# Patient Record
Sex: Male | Born: 1950
Health system: Southern US, Community
[De-identification: ages and names within clinical notes are randomized; demographics above are authoritative.]

## PROBLEM LIST (undated history)

## (undated) DIAGNOSIS — G9341 Metabolic encephalopathy: Secondary | ICD-10-CM

## (undated) DIAGNOSIS — D649 Anemia, unspecified: Secondary | ICD-10-CM

## (undated) DIAGNOSIS — G8929 Other chronic pain: Secondary | ICD-10-CM

## (undated) DIAGNOSIS — R1312 Dysphagia, oropharyngeal phase: Secondary | ICD-10-CM

## (undated) DIAGNOSIS — K279 Peptic ulcer, site unspecified, unspecified as acute or chronic, without hemorrhage or perforation: Secondary | ICD-10-CM

## (undated) DIAGNOSIS — K449 Diaphragmatic hernia without obstruction or gangrene: Secondary | ICD-10-CM

## (undated) DIAGNOSIS — F32A Depression, unspecified: Secondary | ICD-10-CM

## (undated) DIAGNOSIS — E7800 Pure hypercholesterolemia, unspecified: Secondary | ICD-10-CM

## (undated) DIAGNOSIS — K222 Esophageal obstruction: Secondary | ICD-10-CM

## (undated) DIAGNOSIS — J42 Unspecified chronic bronchitis: Secondary | ICD-10-CM

## (undated) DIAGNOSIS — I10 Essential (primary) hypertension: Secondary | ICD-10-CM

## (undated) DIAGNOSIS — K219 Gastro-esophageal reflux disease without esophagitis: Secondary | ICD-10-CM

## (undated) DIAGNOSIS — F1110 Opioid abuse, uncomplicated: Secondary | ICD-10-CM

## (undated) DIAGNOSIS — R5382 Chronic fatigue, unspecified: Secondary | ICD-10-CM

## (undated) DIAGNOSIS — F329 Major depressive disorder, single episode, unspecified: Secondary | ICD-10-CM

## (undated) DIAGNOSIS — F1010 Alcohol abuse, uncomplicated: Secondary | ICD-10-CM

## (undated) DIAGNOSIS — I314 Cardiac tamponade: Secondary | ICD-10-CM

## (undated) DIAGNOSIS — J189 Pneumonia, unspecified organism: Secondary | ICD-10-CM

## (undated) DIAGNOSIS — I2510 Atherosclerotic heart disease of native coronary artery without angina pectoris: Secondary | ICD-10-CM

## (undated) DIAGNOSIS — I7100 Dissection of unspecified site of aorta: Secondary | ICD-10-CM

## (undated) DIAGNOSIS — F419 Anxiety disorder, unspecified: Secondary | ICD-10-CM

## (undated) DIAGNOSIS — J449 Chronic obstructive pulmonary disease, unspecified: Secondary | ICD-10-CM

## (undated) DIAGNOSIS — K5900 Constipation, unspecified: Secondary | ICD-10-CM

## (undated) HISTORY — DX: Esophageal obstruction: K22.2

## (undated) HISTORY — DX: Atherosclerotic heart disease of native coronary artery without angina pectoris: I25.10

## (undated) HISTORY — PX: ORIF SCAPULAR FRACTURE: SUR958

## (undated) HISTORY — DX: Diaphragmatic hernia without obstruction or gangrene: K44.9

## (undated) HISTORY — PX: SHOULDER SURGERY: SHX246

## (undated) HISTORY — PX: CARDIAC CATHETERIZATION: SHX172

## (undated) HISTORY — PX: BACK SURGERY: SHX140

## (undated) HISTORY — DX: Cardiac tamponade: I31.4

## (undated) HISTORY — DX: Essential (primary) hypertension: I10

## (undated) HISTORY — PX: KNEE SURGERY: SHX244

## (undated) HISTORY — DX: Pure hypercholesterolemia, unspecified: E78.00

## (undated) HISTORY — DX: Dissection of unspecified site of aorta: I71.00

---

## 1997-08-07 ENCOUNTER — Emergency Department (HOSPITAL_COMMUNITY): Admission: EM | Admit: 1997-08-07 | Discharge: 1997-08-08 | Payer: Self-pay | Admitting: Emergency Medicine

## 1998-05-15 ENCOUNTER — Emergency Department (HOSPITAL_COMMUNITY): Admission: EM | Admit: 1998-05-15 | Discharge: 1998-05-15 | Payer: Self-pay | Admitting: Emergency Medicine

## 1998-08-27 ENCOUNTER — Encounter: Payer: Self-pay | Admitting: Emergency Medicine

## 1998-08-27 ENCOUNTER — Emergency Department (HOSPITAL_COMMUNITY): Admission: EM | Admit: 1998-08-27 | Discharge: 1998-08-27 | Payer: Self-pay | Admitting: Emergency Medicine

## 1998-09-04 ENCOUNTER — Encounter: Admission: RE | Admit: 1998-09-04 | Discharge: 1998-09-04 | Payer: Self-pay | Admitting: *Deleted

## 1999-07-13 ENCOUNTER — Emergency Department (HOSPITAL_COMMUNITY): Admission: EM | Admit: 1999-07-13 | Discharge: 1999-07-13 | Payer: Self-pay | Admitting: Emergency Medicine

## 1999-07-23 ENCOUNTER — Encounter: Payer: Self-pay | Admitting: Orthopedic Surgery

## 1999-07-23 ENCOUNTER — Ambulatory Visit (HOSPITAL_COMMUNITY): Admission: RE | Admit: 1999-07-23 | Discharge: 1999-07-23 | Payer: Self-pay | Admitting: Orthopedic Surgery

## 2000-03-19 ENCOUNTER — Ambulatory Visit (HOSPITAL_BASED_OUTPATIENT_CLINIC_OR_DEPARTMENT_OTHER): Admission: RE | Admit: 2000-03-19 | Discharge: 2000-03-19 | Payer: Self-pay | Admitting: Orthopedic Surgery

## 2000-03-21 ENCOUNTER — Emergency Department (HOSPITAL_COMMUNITY): Admission: EM | Admit: 2000-03-21 | Discharge: 2000-03-21 | Payer: Self-pay | Admitting: Emergency Medicine

## 2004-09-21 ENCOUNTER — Ambulatory Visit (HOSPITAL_COMMUNITY): Admission: RE | Admit: 2004-09-21 | Discharge: 2004-09-21 | Payer: Self-pay | Admitting: Orthopedic Surgery

## 2006-09-28 ENCOUNTER — Ambulatory Visit: Payer: Self-pay | Admitting: Internal Medicine

## 2006-09-28 ENCOUNTER — Inpatient Hospital Stay (HOSPITAL_COMMUNITY): Admission: EM | Admit: 2006-09-28 | Discharge: 2006-10-09 | Payer: Self-pay | Admitting: Emergency Medicine

## 2006-09-28 ENCOUNTER — Ambulatory Visit: Payer: Self-pay | Admitting: Thoracic Surgery (Cardiothoracic Vascular Surgery)

## 2006-09-30 ENCOUNTER — Encounter: Payer: Self-pay | Admitting: Gastroenterology

## 2006-10-07 ENCOUNTER — Ambulatory Visit: Payer: Self-pay | Admitting: Infectious Diseases

## 2006-10-14 ENCOUNTER — Observation Stay (HOSPITAL_COMMUNITY): Admission: AD | Admit: 2006-10-14 | Discharge: 2006-10-17 | Payer: Self-pay | Admitting: Gastroenterology

## 2006-11-24 ENCOUNTER — Ambulatory Visit: Payer: Self-pay | Admitting: Thoracic Surgery (Cardiothoracic Vascular Surgery)

## 2006-11-28 ENCOUNTER — Encounter: Admission: RE | Admit: 2006-11-28 | Discharge: 2006-11-28 | Payer: Self-pay | Admitting: Gastroenterology

## 2007-02-14 ENCOUNTER — Encounter: Admission: RE | Admit: 2007-02-14 | Discharge: 2007-02-14 | Payer: Self-pay | Admitting: Orthopedic Surgery

## 2007-03-20 ENCOUNTER — Encounter: Admission: RE | Admit: 2007-03-20 | Discharge: 2007-03-20 | Payer: Self-pay | Admitting: Gastroenterology

## 2007-03-21 ENCOUNTER — Encounter: Admission: RE | Admit: 2007-03-21 | Discharge: 2007-03-21 | Payer: Self-pay | Admitting: Gastroenterology

## 2007-04-06 ENCOUNTER — Ambulatory Visit: Payer: Self-pay | Admitting: Thoracic Surgery (Cardiothoracic Vascular Surgery)

## 2007-07-10 ENCOUNTER — Encounter: Admission: RE | Admit: 2007-07-10 | Discharge: 2007-07-10 | Payer: Self-pay | Admitting: Neurosurgery

## 2007-07-27 ENCOUNTER — Ambulatory Visit: Payer: Self-pay | Admitting: Thoracic Surgery (Cardiothoracic Vascular Surgery)

## 2007-07-28 ENCOUNTER — Other Ambulatory Visit: Payer: Self-pay | Admitting: Emergency Medicine

## 2007-07-28 ENCOUNTER — Inpatient Hospital Stay (HOSPITAL_COMMUNITY): Admission: EM | Admit: 2007-07-28 | Discharge: 2007-08-13 | Payer: Self-pay | Admitting: Emergency Medicine

## 2007-07-29 ENCOUNTER — Encounter: Payer: Self-pay | Admitting: Thoracic Surgery (Cardiothoracic Vascular Surgery)

## 2007-07-31 HISTORY — PX: OTHER SURGICAL HISTORY: SHX169

## 2007-08-08 ENCOUNTER — Encounter: Payer: Self-pay | Admitting: Thoracic Surgery (Cardiothoracic Vascular Surgery)

## 2007-08-24 ENCOUNTER — Ambulatory Visit: Payer: Self-pay | Admitting: Thoracic Surgery (Cardiothoracic Vascular Surgery)

## 2007-08-24 ENCOUNTER — Encounter
Admission: RE | Admit: 2007-08-24 | Discharge: 2007-08-24 | Payer: Self-pay | Admitting: Thoracic Surgery (Cardiothoracic Vascular Surgery)

## 2007-09-10 ENCOUNTER — Encounter (HOSPITAL_COMMUNITY): Admission: RE | Admit: 2007-09-10 | Discharge: 2007-12-09 | Payer: Self-pay | Admitting: Cardiology

## 2007-09-14 ENCOUNTER — Ambulatory Visit: Payer: Self-pay | Admitting: Thoracic Surgery (Cardiothoracic Vascular Surgery)

## 2007-11-27 ENCOUNTER — Encounter
Admission: RE | Admit: 2007-11-27 | Discharge: 2007-11-27 | Payer: Self-pay | Admitting: Thoracic Surgery (Cardiothoracic Vascular Surgery)

## 2007-12-04 ENCOUNTER — Ambulatory Visit: Payer: Self-pay | Admitting: Thoracic Surgery (Cardiothoracic Vascular Surgery)

## 2007-12-10 ENCOUNTER — Encounter (HOSPITAL_COMMUNITY): Admission: RE | Admit: 2007-12-10 | Discharge: 2008-01-20 | Payer: Self-pay | Admitting: Cardiology

## 2007-12-31 ENCOUNTER — Ambulatory Visit: Payer: Self-pay | Admitting: Thoracic Surgery (Cardiothoracic Vascular Surgery)

## 2007-12-31 ENCOUNTER — Ambulatory Visit (HOSPITAL_COMMUNITY)
Admission: RE | Admit: 2007-12-31 | Discharge: 2007-12-31 | Payer: Self-pay | Admitting: Thoracic Surgery (Cardiothoracic Vascular Surgery)

## 2007-12-31 HISTORY — PX: OTHER SURGICAL HISTORY: SHX169

## 2008-01-04 ENCOUNTER — Ambulatory Visit: Payer: Self-pay | Admitting: Thoracic Surgery (Cardiothoracic Vascular Surgery)

## 2008-01-22 ENCOUNTER — Encounter (HOSPITAL_COMMUNITY): Admission: RE | Admit: 2008-01-22 | Discharge: 2008-01-29 | Payer: Self-pay | Admitting: Diagnostic Radiology

## 2008-02-07 ENCOUNTER — Encounter: Admission: RE | Admit: 2008-02-07 | Discharge: 2008-02-07 | Payer: Self-pay | Admitting: Neurosurgery

## 2008-03-18 ENCOUNTER — Ambulatory Visit (HOSPITAL_COMMUNITY): Admission: RE | Admit: 2008-03-18 | Discharge: 2008-03-18 | Payer: Self-pay | Admitting: Cardiology

## 2008-03-18 ENCOUNTER — Ambulatory Visit: Payer: Self-pay | Admitting: Thoracic Surgery (Cardiothoracic Vascular Surgery)

## 2008-08-26 ENCOUNTER — Encounter
Admission: RE | Admit: 2008-08-26 | Discharge: 2008-08-26 | Payer: Self-pay | Admitting: Thoracic Surgery (Cardiothoracic Vascular Surgery)

## 2008-09-02 ENCOUNTER — Ambulatory Visit: Payer: Self-pay | Admitting: Thoracic Surgery (Cardiothoracic Vascular Surgery)

## 2008-10-09 ENCOUNTER — Encounter: Admission: RE | Admit: 2008-10-09 | Discharge: 2008-10-09 | Payer: Self-pay | Admitting: Neurosurgery

## 2009-03-13 ENCOUNTER — Encounter: Admission: RE | Admit: 2009-03-13 | Discharge: 2009-03-13 | Payer: Self-pay | Admitting: Orthopedic Surgery

## 2009-05-17 ENCOUNTER — Ambulatory Visit: Payer: Self-pay | Admitting: Thoracic Surgery (Cardiothoracic Vascular Surgery)

## 2009-08-22 ENCOUNTER — Emergency Department (HOSPITAL_COMMUNITY): Admission: EM | Admit: 2009-08-22 | Discharge: 2009-08-22 | Payer: Self-pay | Admitting: Emergency Medicine

## 2009-09-12 ENCOUNTER — Ambulatory Visit: Payer: Self-pay | Admitting: Thoracic Surgery (Cardiothoracic Vascular Surgery)

## 2009-09-12 ENCOUNTER — Encounter
Admission: RE | Admit: 2009-09-12 | Discharge: 2009-09-12 | Payer: Self-pay | Admitting: Thoracic Surgery (Cardiothoracic Vascular Surgery)

## 2009-12-13 ENCOUNTER — Ambulatory Visit (HOSPITAL_COMMUNITY): Admission: RE | Admit: 2009-12-13 | Discharge: 2009-12-13 | Payer: Self-pay | Admitting: Gastroenterology

## 2009-12-21 ENCOUNTER — Encounter: Admission: RE | Admit: 2009-12-21 | Discharge: 2009-12-21 | Payer: Self-pay | Admitting: Gastroenterology

## 2010-02-11 ENCOUNTER — Encounter: Payer: Self-pay | Admitting: Thoracic Surgery (Cardiothoracic Vascular Surgery)

## 2010-02-12 ENCOUNTER — Encounter: Payer: Self-pay | Admitting: Thoracic Surgery (Cardiothoracic Vascular Surgery)

## 2010-02-12 ENCOUNTER — Encounter: Payer: Self-pay | Admitting: Gastroenterology

## 2010-04-06 LAB — CBC
HCT: 38.9 % — ABNORMAL LOW (ref 39.0–52.0)
Hemoglobin: 13.7 g/dL (ref 13.0–17.0)
MCHC: 35.2 g/dL (ref 30.0–36.0)
MCV: 95.3 fL (ref 78.0–100.0)
RDW: 13.6 % (ref 11.5–15.5)

## 2010-04-06 LAB — RAPID URINE DRUG SCREEN, HOSP PERFORMED
Amphetamines: NOT DETECTED
Opiates: POSITIVE — AB
Tetrahydrocannabinol: NOT DETECTED

## 2010-04-06 LAB — URINALYSIS, ROUTINE W REFLEX MICROSCOPIC
Bilirubin Urine: NEGATIVE
Hgb urine dipstick: NEGATIVE
Ketones, ur: NEGATIVE mg/dL
Nitrite: NEGATIVE
Protein, ur: NEGATIVE mg/dL
Specific Gravity, Urine: 1.015 (ref 1.005–1.030)
Urobilinogen, UA: 1 mg/dL (ref 0.0–1.0)

## 2010-04-06 LAB — COMPREHENSIVE METABOLIC PANEL
ALT: 13 U/L (ref 0–53)
Alkaline Phosphatase: 43 U/L (ref 39–117)
BUN: 10 mg/dL (ref 6–23)
CO2: 28 mEq/L (ref 19–32)
Calcium: 8.7 mg/dL (ref 8.4–10.5)
GFR calc non Af Amer: >60 mL/min (ref >60)
Glucose, Bld: 86 mg/dL (ref 70–99)
Potassium: 3.4 mEq/L — ABNORMAL LOW (ref 3.5–5.1)
Total Protein: 6.7 g/dL (ref 6.0–8.3)

## 2010-04-06 LAB — PROTIME-INR
INR: 1.05 (ref 0.00–1.49)
Prothrombin Time: 13.9 seconds (ref 11.6–15.2)

## 2010-04-06 LAB — AMMONIA: Ammonia: 25 umol/L (ref 11–35)

## 2010-04-06 LAB — DIFFERENTIAL
Basophils Absolute: 0 10*3/uL (ref 0.0–0.1)
Basophils Relative: 0 % (ref 0–1)
Eosinophils Absolute: 0.4 10*3/uL (ref 0.0–0.7)
Monocytes Relative: 7 % (ref 3–12)
Neutro Abs: 3.4 10*3/uL (ref 1.7–7.7)
Neutrophils Relative %: 44 % (ref 43–77)

## 2010-04-06 LAB — ETHANOL: Alcohol, Ethyl (B): <5 mg/dL (ref 0–10)

## 2010-05-31 ENCOUNTER — Other Ambulatory Visit: Payer: Self-pay | Admitting: Cardiology

## 2010-05-31 DIAGNOSIS — M549 Dorsalgia, unspecified: Secondary | ICD-10-CM

## 2010-06-05 NOTE — Op Note (Signed)
NAMEJAQUARI, RECKNER NO.:  [id number]   MEDICAL RECORD NO.:  [id number]          PATIENT TYPE:  INP   LOCATION:  2014                         FACILITY:  [facility]   PHYSICIAN:  Salvatore Decent. Dorris Fetch, M.D.DATE OF BIRTH:  [birth year]   DATE OF PROCEDURE:  07/31/2007  DATE OF DISCHARGE:                               OPERATIVE REPORT   PREOPERATIVE DIAGNOSIS:  Severe 3-vessel coronary artery disease with  unstable angina.   POSTOPERATIVE DIAGNOSIS:  Severe 3-vessel coronary artery disease with  unstable angina.   PROCEDURES:  1. Median sternotomy.  2. Extracorporeal circulation.  3. Coronary artery bypass grafting x6 (left internal mammary artery to      left anterior descending, free right internal mammary artery to      ramus intermedius, saphenous vein graft to obtuse marginal,      saphenous vein graft to diagonal, sequential saphenous vein graft      to posterior descending and posterior lateral).   SURGEON:  Salvatore Decent. Dorris Fetch, MD.   ASSISTANT:  Rowe Clack, PA-C   ANESTHESIA:  General.   FINDINGS:  Diffusely diseased coronaries, small caliber coronaries for  the patient's size, good quality conduits, ascending aorta within normal  limits.   CLINICAL NOTE:  Mr. Tuccillo is a 60 year old gentleman who in September 2008  had a type 3 dissection.  During the spring, the patient had been  complaining of exertional discomfort in his back and neck and arms, but  refused cardiac workup.  He presented to the emergency room complaining  of severe episode of discomfort with minimal exertion.  He had elevated  troponins.  CT of the chest showed no change in his aortic dissection.  He was advised to be admitted, but left against medical advice.  He  subsequently returned the following day with repeat episode, but refused  workup and left again.  He presented to Dr. Henriette Combs office the  following day and was admitted directly.  He underwent cardiac  catheterization on July 29, 2007, by Dr. Corliss Marcus were he was found  to have severe 3-vessel coronary artery disease with preserved left  ventricular function.  The patient was advised to undergo coronary  artery bypass grafting.  The indications, risks, benefits, and  alternatives were discussed in detail with the patient.  He understood  and accepted the risks of surgery and agreed to proceed.   OPERATIVE NOTE:  Mr. List was brought to the preop holding area on July 31, 2007, where the anesthesia service placed lines for monitoring,  arterial central venous, and pulmonary arterial pressure.  Intravenous  antibiotics were administered.  The patient was taken to the operating  room, anesthetized and intubated.  A Foley catheter was placed.  The  chest, abdomen, and legs were prepped and draped in the usual fashion.  A median sternotomy was performed.  The left internal mammary artery was  harvested using standard technique.  It was a good quality vessel with  excellent flow.  The right internal mammary artery also was harvested  using standard technique.  It  likewise was a good quality vessel.  Simultaneously, an incision was made in the medial aspect of the right  leg at the level of the knee.  The greater saphenous vein was harvested  from the right leg endoscopically.   Five thousand units of heparin was administered during the vessel  harvest.  Remainder of the full heparin dose was given prior to opening  of the pericardium.   After harvesting the conduits, the pericardium was opened.  The  ascending aorta was inspected.  It was of normal size and was cannulated  via concentric 2-0 pledgeted purse-string sutures without difficulty.  Dual stage venous cannula was placed via purse-suture in the atrial  appendage.  Cardiopulmonary bypass was initiated and the patient was  cooled to 32 degrees Celsius.  Flows were maintained per protocol  throughout the procedure.   The coronary  arteries were inspected and anastomotic sites were chosen.  Of note, the coronaries were very small vessels for the patient's size.  The conduits were inspected and cut to length, a foam pad was placed in  the pericardium to protect the left phrenic nerve, a temperature probe  was placed in the myocardial septum, and a cardioplegic cannula was  placed in the ascending aorta.   The aorta was cross clamped.  The left ventricle was emptied via the  aortic root vent.  Cardiac arrest was then achieved with a combination  of cold antegrade blood cardioplegia and topical iced saline.  After  achieving a complete diastolic arrest and adequate myocardial septal  cooling, the following distal anastomoses were performed.   First, a reverse saphenous vein graft was placed sequentially to the  posterior descending and posterolateral branch of the right coronary.  The posterior descending arose at the acute margin and took an oblique  course to the septum more distally.  The posterolateral branch was the  second posterior lateral.  The first was totally occluded, but was too  small to graft.  The posterior descending was a 1.5-mm vessel.  The  posterolateral was a 1-mm vessel.  Side-to-side anastomosis was  performed to the posterior descending and end-to-side to the  posterolateral; both were performed with running 7-0 Prolene sutures.  All anastomoses were probed proximally and distally at their completion.  At the completion of each vein graft, cardioplegia was administered to  assess flow and hemostasis.   Next, a reverse saphenous vein graft was placed to the distal obtuse  marginal branch of the left circumflex.  This also likewise was a small  1-mm vessel.  Vein graft was anastomosed end-to-side with running 7-0  Prolene suture.   Next, a reverse saphenous vein graft was placed end-to-side to the first  diagonal branch of the LAD.  This was a diffusely diseased vessel and  was compromised  by multiple stenoses in the LAD itself.  The vein graft  was anastomosed in an end-to-side fashion with a running 7-0 Prolene  suture.   Next, the right mammary was assessed to see whether it would reach to  the distal LAD.  It was not of sufficient length to do so.  Given the  patient's future potential for aortic surgery, it was not felt safe to  place this through the transverse sinus, therefore the proximal end of  the right mammary was divided.  The proximal stump was suture-ligated  with a 2-0 silk suture.  The free right internal mammary artery then was  beveled to its distal and anastomosed end-to-side to  the ramus  intermedius.  This vessel did accept a 1.5-mm probe.  After the  completion of the anastomosis, cardioplegia was administered down the  vein grafts and aortic root.  There was good backbleeding from the free  right mammary and the bulldog clamp was placed across it.   Next, the left internal mammary artery was brought through a window in  the pericardium.  The distal end was beveled and was anastomosed end-to-  side to the distal LAD.  The LAD was a 1.5-mm vessel and was diffusely  diseased proximally and there was plaquing distally as well.  The  anastomosis was performed with a running 8-0 Prolene suture.  At the  completion of mammary to LAD anastomosis, the bulldog clamp was briefly  removed to inspect for hemostasis.  Immediate rapid septal rewarming was  noted.  The bulldog clamp was replaced.  The mammary pedicle was tacked  to the epicardial surface of the heart with 6-0 Prolene sutures.   Additional cardioplegia was administered.  The vein grafts were cut to  length.  Cardioplegia cannulas were removed from the ascending aorta.  The proximal vein graft anastomoses were performed to 4.5-mm punch  aortotomies with running 6-0 Prolene sutures while under crossclamp.  The total crossclamp time was 91 minutes.   At the completion of final and proximal  anastomosis, the patient was  placed in Trendelenburg position.  The aortic root was de-aired.  The  bulldog clamps were removed from the left mammary artery.  Lidocaine was  administered and the aortic crossclamp was removed.  Total crossclamp  time was 91 minutes.   Bulldog clamps were placed proximally and distally on the OM vein graft  and the proximal anastomosis for the free right internal mammary artery  was placed end-to-side into this vein graft.  At the completion  anastomosis, the bulldog clamps were sequentially removed, de-airing the  anastomosis prior to tying the suture.  While the patient was being  rewarmed, all proximal and distal anastomoses were inspected for  hemostasis.  Epicardial pacing wires were placed on the right ventricle  and right atrium, and the patient was rewarmed to a core temperature of  37 degrees Celsius.  He was weaned from cardiopulmonary bypass on the  first attempt without difficulty.  The total bypass time was 156  minutes.   The initial cardiac index was greater than 2 liters per minute per meter  squared.  The patient remained hemodynamically stable throughout post-  bypass period.   A test dose of protamine was administered and was well tolerated.  The  atrial and aortic cannulae were removed.  The remainder of protamine was  administered without incident.  Chest was irrigated with 1 liter of warm  normal saline containing 1 g of vancomycin.  Hemostasis was achieved.  Bilateral pleural tubes and single mediastinal tube were placed through  separate subcostal incisions.  The sternum was closed with interrupted  heavy gauge stainless steel wires.  The pectoralis fascia, subcutaneous  tissue, and skin were closed in standard fashion.  All sponge, needle,  and instrument counts were correct at the end of the procedure.  The  patient was taken from the operating room to the surgical intensive care  unit in critical, but stable  condition.      Salvatore Decent Dorris Fetch, M.D.  Electronically Signed     SCH/MEDQ  D:  08/03/2007  T:  08/04/2007  Job:  [id number]   cc:   Francisca December,  M.D.  Danise Edge, M.D.

## 2010-06-05 NOTE — Assessment & Plan Note (Signed)
OFFICE VISIT   KEONTA, ALSIP  DOB:  October 29, 1950                                        April 06, 2007  CHART #:  [id number]   The patient  comes in for a 71-month followup today.   He was previously seen in consultation at [facility] by Dr.  Dorris Fetch on  09/28/2006 with a type 3 aortic dissection.  At the time  he was having back pain and CT showed a type 3 dissection starting at  the takeoff of the left subclavian and extending into the abdominal  aorta.  He was also noted to have some hypertension and was started on  antihypertensive medications.  He has recently followed up with his  primary care physician, and states that his blood pressures have been  stable, running in the 120/80 range.   Today, he states that he has continued to have back discomfort which he  describes as a burning sensation around his left scapula.  He also  describes some symptoms of burning in the anterior left chest which  occurs only with exertion and is relieved with rest.  He has some  associated shortness of breath.  He denies any abdominal discomfort.  He  states that Dr. Laural Benes is aware of these symptoms.   He has a history of chronic low back pain and has had a previous back  surgery and is on chronic pain medication at home.   PHYSICAL EXAMINATION:  Vital signs:  Blood pressure is 135/94, pulse is  76 and regular, respirations 16 and unlabored, O2 sat 97% on room air.  Heart:  Regular rate and rhythm without murmurs, rubs, or gallops.  Lungs:  Clear to auscultation.  Bilateral radial pulses are 3 plus and  symmetrical.  Lower extremities:  Without clubbing, cyanosis, or edema.  Pedal pulses are slightly diminished bilaterally with 1-2 plus DP pulses  and no palpable posterior tibial pulses.  Abdomen:  Soft, nontender,  nondistended with active bowel sounds in all quadrants.   MR angiogram of the chest and abdomen performed on 03/20/2007, shows  stable  thoracic aortic dissection with stable narrowing at the origin of  the celiac.   ASSESSMENT/PLAN:  Stable type 3 aortic dissection.  Dr. Dorris Fetch has  seen the patient and examined him today.  He is stable from his  dissection; however, his symptoms of anterior chest burning, shortness  of breath, and decreased exercise tolerance are concerning for angina.  Dr. Dorris Fetch has recommended following up with Dr. Laural Benes possibly  with a stress test in the near future.  We will see him back in 6 months  with a repeat CT scan to re-evaluate his dissection.  In the interim, he  is asked to contact us if his symptoms change.   Salvatore Decent Dorris Fetch, M.D.  Electronically Signed   GC/MEDQ  D:  04/06/2007  T:  04/06/2007  Job:  [id number]   cc:   Danise Edge, M.D.

## 2010-06-05 NOTE — H&P (Signed)
NAMEARTUR, [name] NO.:  [id number]   MEDICAL RECORD NO.:  [id number]          PATIENT TYPE:  INP   LOCATION:  5735                         FACILITY:  [facility]   PHYSICIAN:  Danise Edge, M.D.   DATE OF BIRTH:  1950/09/03   DATE OF ADMISSION:  10/14/2006  DATE OF DISCHARGE:                              HISTORY & PHYSICAL   ADMISSION DIAGNOSES:  1. Descending aortic dissection September 28, 2006.  2. Nausea, vomiting, epigastric pain and low back pain.   HISTORY:  [name]. [name] is a 60 year old male born 02/26/1950.   [name] was hospitalized at [facility] from September 7-19,  2008, to evaluate and treat an acute descending aortic dissection  starting just distal to the left subclavian artery and extending to the  celiac axis, confirmed by CT angiography on admission and by MR  angiography prior to discharge.   Since discharge [name] has experienced anorexia with nausea and  vomiting with attempted food intake.  He has developed constipation on  methadone.  He continues to have low back pain, bilateral thigh and leg  pain, and has developed epigastric pain.  He denies signs of  gastrointestinal bleeding.  He did not use nonsteroidal anti-  inflammatory medication.  In 1998 his esophagogastroduodenoscopy  revealed a pyloric channel ulcer which was negative for H. pylori  gastritis.  In June he underwent lumbar fusion at [facility] by  Dr. Trey Sailors.  His weight has decreased approximately 6 pounds since  discharged from [facility].   MEDICATIONS ALLERGIES:  None.   CURRENT MEDICATIONS:  1. Methadone 10 mg t.i.d.  2. Labetalol 200 mg twice daily.  3. Hydrochlorothiazide 25 mg each morning.   PAST MEDICAL HISTORY:  1. Descending aortic dissection, September 28, 2006.  2. Lumbar spine fusion surgery, June 2008.  3. December 09, 1985, esophagogastroduodenoscopy revealed a Schatzki      ring and hiatal hernia; serum gastrin  normal.  4. Jun 09, 1995, proctocolonoscopy to the cecum normal.  5. 1991, shoulder surgery.  6. Hypercholesterolemia.  7. 1998, esophagogastroduodenoscopy revealed a pyloric channel ulcer      which was capital CLO-test negative.   FAMILY HISTORY:  Positive for coronary artery disease.   HABITS:  [name] smokes a pack of cigarettes per day but does not  consume alcohol   VITAL SIGNS:  Weight 184 pounds, blood pressure 136/80, temperature  98.9.  GENERAL APPEARANCE:  [name] is alert and ambulatory.  HEENT:  Nonicteric sclerae.  NECK:  Good carotid pulses bilaterally.  HEART:  Regular rhythm without murmurs.  LUNGS:  Clear to auscultation.  ABDOMEN:  Epigastric discomfort to deep palpation but his abdomen is  soft, flat and nondistended.  EXTREMITIES:  Good dorsalis pedis pulses bilaterally.   PLAN:  1. Admit for intravenous saline.  2. MR angiogram.           ______________________________  Danise Edge, M.D.     MJ/MEDQ  D:  10/15/2006  T:  10/15/2006  Job:  [id number]  cc:   Payton Doughty, M.D.

## 2010-06-05 NOTE — Assessment & Plan Note (Signed)
OFFICE VISIT   [name], BOCCIO  DOB:  July 09, 1950                                        September 14, 2007  CHART #:  [id number]   The patient comes in today for followup.  He is status post CABG on July 31, 2007.  He also underwent a sternal re-exploration and drainage of  tamponade on August 08, 2007.  He was most recently seen on August 3rd, at  which time he was complaining of poor appetite and some medication  intolerances.  Since his last visit, he saw Dr. Amil Amen and got his  medication issues resolved.  He presently is back on all of his  medications; however, he is now taking Crestor for his cholesterol and  is taking it every other day to help with some symptoms of nausea.  Overall, he is feeling much better.  He was seen in the cardiac rehab  office and underwent his preliminary session, and hopefully, we will be  ready to start cardiac rehab soon.  He is getting stronger every day and  is having minimal soreness at this time.   PHYSICAL EXAMINATION:  VITAL SIGNS:  Blood pressure is 123/80, pulse is  68 and regular, respirations 18, O2 sat 96% on room air.  CHEST:  All his incisions including the two central chest tube sites are  well healed at this point.  No erythema or drainage.  Sternum is stable  to palpation.  HEART:  Regular rate and rhythm without murmurs, rubs, or gallops.  LUNGS:  Clear to auscultation.  EXTREMITIES:  No edema.   ASSESSMENT AND PLAN:  The patient is doing well postoperatively.  At  this point, we will see him back on a p.r.n. basis only.  He will  continue to follow up regularly with Dr. Amil Amen.  He is currently 5  weeks postop from his sternal exploration.  We will release him to start  cardiac rehab next week once he is completely 6 weeks out from surgery.  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  He may gradually  begin driving short distances and  resuming his normal activities otherwise.  He may call in the interim if  he experiences any problems or has questions.   Salvatore Decent Dorris Fetch, M.D.  Electronically Signed   GC/MEDQ  D:  09/14/2007  T:  09/15/2007  Job:  [id number]   cc:   Francisca December, M.D.

## 2010-06-05 NOTE — Consult Note (Signed)
NAMEBHAVIN, MONJARAZ NO.:  [id number]   MEDICAL RECORD NO.:  [id number]          PATIENT TYPE:  INP   LOCATION:  5729                         FACILITY:  [facility]   PHYSICIAN:  Payton Doughty, M.D.      DATE OF BIRTH:  Feb 09, 1950   DATE OF CONSULTATION:  10/03/2006  DATE OF DISCHARGE:                                 CONSULTATION   NEUROSURGERY CONSULTATION:   HISTORY OF PRESENT ILLNESS:  We were called to see this 60 year old  patient of mine who had a lumbar fusion three or four months ago.  Had  been doing well, then Sunday had an aortic dissection.  His complaints  now are starting in his chest radiating down the left side into his  flank and indeed to the leg, mostly on the left.  He has no low back  pain or lumbar radicular pain.  Lower extremities are full strength with  intact reflexes.  It appears as if he has good pulses in his lower  extremities.   ASSESSMENT:  The pain is from the dissection.  His back is reasonably  well-healed and the pain that he has is in no way commensurate with  lumbar spine pain from the fusion.  I would be very concerned about  extension of his dissection given bouts of pain that he has that get  down into his extremities.  I would encourage pain control.  Dilaudid is  currently being used and I feel is a poor analgesic as is methadone.  They may provide a modicum of relief, but for acute pain do not do a  very good job.  I would recommend using oxycodone 10 to 20 mg every two  to four hours as needed during the acute phase and then start OxyContin  40 mg every eight hours.  Phase down the oxycodone as the OxyContin  level builds.  I would also continue to assess his vasculature as the  bouts of pain are more commensurate with dissection pain than they are  with lumbar radicular pain from a fusion.           ______________________________  Payton Doughty, M.D.     MWR/MEDQ  D:  10/03/2006  T:  10/03/2006  Job:  [id number]

## 2010-06-05 NOTE — Assessment & Plan Note (Signed)
OFFICE VISIT   [name], [name]  DOB:  Nov 05, 1950                                        September 02, 2008  CHART #:  [id number]   The patient returns today for follow up of his descending aortic  dissection.  He was last seen in the office on January 04, 2008 in  follow up for his sternal pain where he did have wires removed.  In  addition to his descending dissection, he had coronary bypass grafting  on July 10.  He had the sternal reexploration and drainage of  hemopericardium for cardiac tamponade on the 18th and subsequently, he  had chronic sternal pain. The sternal wires were removed and his pain  improved drastically after his wires were removed.  He essentially has  no pain in his sternum at the present time.  He does say, he is having  some particularly severe back pain recently and today is having some  significant back pain.  He has had some medication changes since his  last visit and specifically his blood pressure had been running low,  therefore Dr. Laural Benes stopped his lisinopril and decreased his Lopressor  to once daily.  He remains on Imdur, which he says gives him a headache.  He did have two vein grafts that failed, those were to diffusely disease  small caliber coronaries.   CURRENT MEDICATIONS:  Zetia 10 mg daily, Imdur 20 mg b.i.d., Crestor 10  mg daily, Lopressor 25 mg daily and oxycodone 10 mg q.6 h. p.r.n.   He has no known drug allergies.   The patient's medical history form is reviewed and is on the chart.   PHYSICAL EXAMINATION:  GENERAL:  The patient is a 60 year old white  male.  He is in discomfort with movement.  He is in no acute distress.  VITAL SIGNS:  His blood pressure is 151/94, pulse 58, respirations 18,  his oxygen saturation is 90% on room air.  NEUROLOGIC:  He is alert and oriented x3.  NECK:  Without bruits.  LUNGS:  Clear with equal breath sounds bilaterally.  CARDIAC:  Regular rate and rhythm.  Normal S1 and S2.   I do not hear a  murmur or rub.  His radial pulses are 2+ bilaterally.  His dorsalis  pedis were palpable and slightly diminished bilaterally.   LABORATORY DATA:  MR angio of the chest shows a stable descending  thoracic aneurysm with maximal aortic diameter of 3.9.  There has been  no change in the appearance of the true and false lumen.   IMPRESSION:  The patient is a 60 year old gentleman with history of a  type 3 aortic dissection.  He also has a history of coronary disease and  has been treated with coronary bypass grafting.  Unfortunately, he did  have two vein grafts which failed relatively early, likely due to poor  outflow as his coronaries were small and diffusely diseased.  He has  been on Imdur since that time, he is not having any angina.  He does  have headaches with the Imdur.  He says that his blood pressure had been  down in the 90s so his blood pressure medications were changed.  His  blood pressure is elevated today, when I questioned about that he said  that it is because his back is hurting him.  When he takes his blood  pressure and is not having pain it runs in the 110/70 range.  He has  noted in the presence of pain, blood pressure is as high as 180/110.  I  cautioned him that with the blood pressure elevation today, he needs to  have that followed up in the near future to make sure that, that is not  persistently elevated to that degree.  He does have followup  appointments with Dr. Laural Benes and Dr. Amil Amen, but is not sure exactly  when those appointments are.  In terms of his descending dissection, it  is stable and unchanged.  There is no indication for any intervention on  that at the present time.  I will plan to follow him up in 1 year with a  repeat MR angio to make sure there is no evidence of aneurysmal  dilatation.    [name] [name], M.D.  Electronically Signed   SCH/MEDQ  D:  09/02/2008  T:  09/02/2008  Job:  [id number]   cc:   [name],  M.D.

## 2010-06-05 NOTE — Op Note (Signed)
NAMECASSADY, TURANO NO.:  [id number]   MEDICAL RECORD NO.:  [id number]         PATIENT TYPE:  CINP   LOCATION:                               FACILITY:  [facility]   PHYSICIAN:  Salvatore Decent. Dorris Fetch, M.D.DATE OF BIRTH:  Jun 15, 1950   DATE OF PROCEDURE:  08/08/2007  DATE OF DISCHARGE:                               OPERATIVE REPORT   PREOPERATIVE DIAGNOSIS:  Cardiac tamponade after removal of pacing  wires.   POSTOPERATIVE DIAGNOSIS:  Cardiac tamponade after removal of pacing  wires secondary to bleeding from avulsed branch of vein graft.   SURGEON:  Salvatore Decent. Dorris Fetch, MD   ASSISTANTS:  Teryl Lucy, RNFA.   ANESTHESIA:  General.   FINDINGS:  Cardiac tamponade, large amount of clotted blood within the  pericardium with cardiogenic shock on arrival to OR, bleeding from vein  graft to the posterior descending and posterior lateral, likely some  avulsed branch, hemodynamically stable after evacuation of pericardium.  Postoperative chest x-ray showed no retained foreign bodies.   CLINICAL NOTE:  [name] is a 60 year old gentleman who had coronary  bypass grafting on July 31, 2007, for severe three-vessel coronary  artery disease.  Six grafts were done at that time.  Temporary pacing  wires were left in postoperatively as per protocol.  On August 08, 2007,  with the patient near ready for discharge, the pacing wires were  removed.  Shortly after removing the pacing wires, the nurse noted the  patient was becoming mottled, pulse became thready, and the patient  became cool and clammy.  I was called as was the rapid response team.  On arrival to the patient's room, he was in shock.  He was tachycardic  and hypertensive.  He was receiving normal saline bolus.  He was  transported urgently to the ICU where anesthesia established additional  intravenous access and arterial blood pressure monitoring line, and  intubated the patient for progressive respiratory distress.   An  echocardiogram at that time showed a large pericardial effusion.  The  patient was transported immediately to the operating room, and he was  placed on the operating bed.  General anesthesia was induced.  He was  rapidly prepped and draped.   OPERATIVE NOTE:  After the patient had induction of general anesthesia  and was prepped and draped, he was in cardiogenic shock.  An incision  was made incorporating the lower portion of the patient's previous  sternotomy incision over the xiphoid process, was carried through the  skin and subcutaneous tissue.  The space of the pericardium was entered.  There was clotted red blood within the pericardium.  There was  improvement in blood pressure immediately with release of the pressure.  However, there was significant retained clot and blood within the  pericardium that could not be safely evacuated.  Incision was made to  reopen the entire sternotomy, incision was extended at the length of the  previous incision, carried through the skin and subcutaneous tissue.  The wires were removed and the sternum was spread.  The previous  pericardium closure  was opened.  There was a large amount of clotted  blood, which was bright red.  Inspection of the right atrium and right  ventricle where the wires had been sutured to the heart showed no signs  of bleeding.  At this point, it was evident that the bleeding was coming  more in the diaphragmatic surface and was in fact coming from the vein  graft, which had been placed at the posterior descending and  posterolateral to previous operation, there appeared to have avulsed  branch of the vein graft.  This was controlled with a 7-0 Prolene  suture.  Second 7-0 Prolene suture was placed for security.  There was  no significant kinking or narrowing of the graft.  No further ongoing  bleeding was noted.  The chest was copiously irrigated with warm saline.  All sites that were accessible, which was essentially  the anterior  sites, were inspected with no other evidence of bleeding.  Both pleural  spaces were opened and moderately large pleural effusions were removed  from both pleural spaces.  Single mediastinal tube and bilateral pleural  tubes were then placed through separate subcostal incisions.  The  pericardium could not be reapproximated and the sternum was closed with  heavy gauge interrupted stainless steel wires.  The pectoralis fascia,  subcutaneous tissue, and skin were closed in standard fashion.  The  existing chest tube sites were packed with iodoform gauze.  The patient  then was kept in the operating room.  A chest x-ray was done and counts  were incomplete.  There was no evidence of retained foreign body.  He  then was transported to the surgical intensive care unit, intubated in a  critical but stable condition.      Salvatore Decent Dorris Fetch, M.D.  Electronically Signed     SCH/MEDQ  D:  08/11/2007  T:  08/12/2007  Job:  [id number]   cc:   Danise Edge, M.D.  Francisca December, M.D.

## 2010-06-05 NOTE — Assessment & Plan Note (Signed)
OFFICE VISIT   [name], [name]  DOB:  [birth year]                                        August 24, 2007  CHART #:  [id number]   The patient comes in today for first postoperative followup.  He is  status post CABG x6 on 07/31/2007.  His postoperative course was  complicated by a cardiac tamponade which occurred after the removal of  his pacing wires and caused bleeding from an avulsed vein graft which  required operative repair on 08/08/2007.  He was ultimately discharged  home on 08/13/2007 in good condition.  He comes in today with complaints  of persistent nausea which has been going on since his discharge.  He is  having trouble eating and it has gone to the point where he has  discontinued all of his medications over the past 3 days with some  improvement of his symptoms.  He does state that in the past his primary  care physician had started him on medication for his cholesterol, which  he did not tolerate.  He do not recall the specific name of the  medication.  He is walking some daily, although he is still very weak.  He is not short of breath.  He is still having a lot of soreness and a  lot of difficulty sleeping.  He also has complaints of some mild  swelling and slight tenderness in his right thigh.  He sees Dr. Amil Amen'  PA on August 27, 2007.   PHYSICAL EXAMINATION:  VITAL SIGNS:  Blood pressure is 132/90, pulse is  86, respirations 18, and O2 sat 97% on room air.  His sternal incision  has healed well.  His chest tube sutures are removed in the office  today.  His 2 central chest tube sites are open and are being packed  with 2 x 2 gauze.  This appeared to be granulating well and has no  drainage or purulence.  His sternum is stable to palpation.  HEART:  Regular rate and rhythm without murmurs, rubs, or gallops.  LUNGS:  Clear.  EXTREMITIES:  No significant edema.  His right EVH site has healed well.  He does have a small firm nodule in the mid  thigh on the right, which is  mildly tender to palpation.  It does not appear to be erythematous and  there is no drainage or surrounding swelling.   Chest x-ray shows small bilateral pleural effusions with atelectasis.   ASSESSMENT AND PLAN:  Dr. Dorris Fetch has also seen the patient and  reviewed his x-ray today.  Regarding his nausea and difficulty eating,  we suspect that this may be secondary to the Lipitor as he has had  problems with cholesterol medications in the past.  Dr. Dorris Fetch has  recommended discontinuing the Lipitor and holding the Lotensin at this  time.  We will restart him on an enteric-coated aspirin daily as well as  his metoprolol and see if he tolerates this without problem.  We will  have him check with Dr. Amil Amen this week in his visit about restarting  his ACE inhibitor if he is tolerating his other medications.  The nodule  in his right thigh appears to be a small hematoma, but does not appear  to be infected.  We have asked him to keep an eye on this  area and  report back if there is any change including increased swelling,  erythema, or tenderness.  He has been counseled to continue sternal  precautions until 6 weeks from his sternal reexploration.  We will plan  on seeing him back in the office in approximately 3 weeks for follow up.  They will continue with daily dressing changes to the chest tube sites  and we will reevaluate these when he comes in for follow up visit.  He  may call in the interim if he experiences any problems or has questions.   Salvatore Decent Dorris Fetch, M.D.  Electronically Signed   GC/MEDQ  D:  08/24/2007  T:  08/25/2007  Job:  [id number]   cc:   Francisca December, M.D.

## 2010-06-05 NOTE — Consult Note (Signed)
NAMEDESMUND, ELMAN NO.:  [id number]   MEDICAL RECORD NO.:  [id number]          PATIENT TYPE:  INP   LOCATION:  0104                         FACILITY:  [facility]   PHYSICIAN:  Salvatore Decent. Dorris Fetch, M.D.DATE OF BIRTH:  12-10-1950   DATE OF CONSULTATION:  09/28/2006  DATE OF DISCHARGE:                                 CONSULTATION   REASON FOR CONSULTATION:  Type 3 aortic dissection.   HISTORY OF PRESENT ILLNESS:  Mr. Quezada is a 60 year old gentleman, with no  known history of hypertension, who presents with acute onset of back  pain last night. The patient has had prior back surgery and has a  history of chronic back pain.  However, this pain was very different and  occurred at about 11 o'clock at night.  He was watching TV at the time,  and it was severe.  It started in his upper back and then migrated to  his lower back.  He came the emergency room about 3 in the morning.  Workup revealed a type 3 dissection starting at the takeoff of the left  subclavian and extending into his abdominal aorta.  The CT did not go  below the celiacs, but the origin of the celiac was involved with the  dissection.  The patient currently has had significant relief of his  pain.  He does still have some discomfort which he feels is primarily  related to positioning and difficulty getting comfortable.   The patient's blood pressure did normalize rapidly with pain control.   PAST MEDICAL HISTORY:  Is significant for back surgery and chronic back  pain.   MEDICATIONS:  Hydromorphone p.r.n.Marland Kitchen   ALLERGIES:  No known drug allergies.   SOCIAL HISTORY:  He works in Landscape [employer].  He does smoke one  pack per day.   FAMILY HISTORY:  Family history of cardiac disease with both parents  dying in their mid 65s from myocardial infarction.   PHYSICAL EXAMINATION:  GENERAL:  Mr. Hang is a 60 year old white male who  is in mild to moderate discomfort.  VITAL SIGNS:  Blood pressure  128/78, pulse 83, respirations 16.  NEUROLOGIC:  He is alert and oriented x3, appropriate, grossly intact.  HEENT:  Exam is unremarkable.  NECK:  Supple without thyromegaly, adenopathy or bruits.  CHEST:  Clear.  CARDIAC:  Regular rate and rhythm.  Normal S1-S2.  No rubs, murmurs, or  gallops.  ABDOMEN:  Soft, nontender.  EXTREMITIES:  Without clubbing, cyanosis or edema.  He has 2+ pulses.   ECG shows normal sinus rhythm with a left anterior fascicular block.   LABORATORY DATA:  Sodium 134, potassium 3.5, chloride 99, glucose 110,  BUN 8, creatinine 0.75.  His cardiac enzymes were negative.  PT is 12.8.  White count 11.8, hematocrit 40, platelets 225.   CT of the chest as previously described.   IMPRESSION:  Mr. Corne is a 60 year old gentleman who presents with an  acute type 3 dissection.  He has no evidence of end-organ compromise;  therefore, the standard of care is medical therapy.  I agree with Dr.  Henriette Combs plan for admission to the ICU with aggressive blood pressure  control with labetalol and enalapril.  In addition to his p.o.  labetalol, I have written for intravenous labetalol p.r.n. every hour as  needed to keep his blood pressure below 140. He also has of standing  p.r.n. order for enalapril intravenously to keep his blood pressure less  than 120, which I think should be our goal. He will need long-term  followup with followup scans at 73-month intervals for the first several  years followed by annual followups for life.      Salvatore Decent Dorris Fetch, M.D.  Electronically Signed     SCH/MEDQ  D:  09/28/2006  T:  09/28/2006  Job:  [id number]   cc:   Danise Edge, M.D.  Fax: [phone]

## 2010-06-05 NOTE — Discharge Summary (Signed)
[name], HNAT NO.:  [id number]   MEDICAL RECORD NO.:  [id number]          PATIENT TYPE:  OBV   LOCATION:  5735                         FACILITY:  [facility]   PHYSICIAN:  Danise Edge, M.D.   DATE OF BIRTH:  15-Feb-1950   DATE OF ADMISSION:  10/14/2006  DATE OF DISCHARGE:  10/17/2006                               DISCHARGE SUMMARY   DISCHARGE DIAGNOSES:  1. Resolved nausea, vomiting and epigastric pain on proton pump      inhibitor therapy.  2. Stable descending aortic dissection diagnosed September 28, 2006.  3. Chronic low back ache associated with recent lumbar fusion.   DISCHARGE MEDICATIONS:  1. Hydrochlorothiazide 25 mg each morning.  2. Labetalol 100 mg twice daily.  3. Amitriptyline 100 mg at bedtime.  4. Oxycodone 10 mg every 6 hours as needed.  5. Laxative of choice.  6. Omeprazole 20 mg each morning before breakfast.  7. Lorazepam 1 mg every 8 hours as needed.   OFFICE FOLLOWUP:  I have asked [name] to schedule an appointment to see  me in approximately 2 weeks.   HOSPITAL COURSE:  [name] is a 60 year old male born September 19, 1950.   [name] was hospitalized at [facility] from September 7 to  October 10, 2006, to evaluate and treat an acute descending aortic  dissection starting just distal to the left subclavian artery and  extending to the celiac axis confirmed by CT angiography on admission  and MR angiography prior to discharge.   On October 14, 2006, [name] returned to my office experiencing  anorexia with nausea and vomiting.  He developed constipation on  methadone.  He was experiencing epigastric pain and his chronic low back  pain.  He was not taking nonsteroidal anti-inflammatory medications.   In 1998, he underwent an esophagogastroduodenoscopy which revealed a  pyloric channel ulcer negative for H. pylori gastritis.   On admission to the hospital, [name] received intravenous saline volume  expansion.   His methadone was stopped, and he was switched to oxycodone  p.r.n. which he tolerated well.  He was prescribed MiraLax which  relieved his constipation.  His nausea and vomiting resolved.   A repeat MR angiogram revealed a stable descending aortic  dissection,unassociated with leak or aneurysmal dilation.   He underwent a barium swallow upper GI x-ray series which revealed a  possible mass in the post duodenal bulbar region.   On the afternoon of discharge, he underwent a diagnostic  esophagogastroduodenoscopy which revealed a normal hypopharynx,  esophagus, and stomach.  In the post duodenal bulbar area, the small-  bowel folds were edematous and red, but there was no mass, stricture  formation, tumor, or visible ulceration.  The second portion and third  portions of the duodenum otherwise appeared normal.   [name] is discharged in stable medical condition.  He is on an  unrestricted diet.  I will see him back in the office in approximately 2  weeks.   LABORATORY DATA:  Admission white blood cell count 8400,  hemoglobin 12.1  grams, platelet count of 476,000.  Admission complete metabolic profile  was normal.  Admission lipase was normal.           ______________________________  Danise Edge, M.D.     MJ/MEDQ  D:  10/17/2006  T:  10/17/2006  Job:  [id number]   cc:   Payton Doughty, M.D.

## 2010-06-05 NOTE — Op Note (Signed)
NAMEAEDAN, [name] NO.:  [id number]   MEDICAL RECORD NO.:  [id number]          PATIENT TYPE:  AMB   LOCATION:  SDS                          FACILITY:  [facility]   PHYSICIAN:  Salvatore Decent. Dorris Fetch, M.D.DATE OF BIRTH:  11/07/1950   DATE OF PROCEDURE:  12/31/2007  DATE OF DISCHARGE:  12/31/2007                               OPERATIVE REPORT   PREOPERATIVE DIAGNOSIS:  Chronic sternal pain after sternotomy.   POSTOPERATIVE DIAGNOSIS:  Chronic sternal pain after sternotomy.   PROCEDURE:  Sternal wire removal.   SURGEON:  [name], MD   ANESTHESIA:  General.   FINDINGS:  All wires removed intact, bone well healed.   CLINICAL NOTE:  [name] is a 61 year old gentleman who has undergone  coronary artery bypass grafting and he has had problems with persistent  pain since that time despite the sternum being stable.  The patient was  advised that sternal wire removal could possibly relieve the pain, but  initially opted not to have this procedure.  However, he now has decided  that he does wish to have the sternal wires removed as the bone has well  healed, it should not be a problem from that standpoint.  The  indications, risks, benefits, and alternatives were discussed in detail  with the patient.  He does understand there is a possibility that this  may not change his chronic pain syndrome.   OPERATIVE NOTE:  [name] was brought to the preop holding area on  December 31, 2007.  There the Anesthesia Service established intravenous  access.  PAS hose was placed for DVT prophylaxis.  The patient was taken  to the operating room.  Intravenous antibiotics were administered.  General anesthesia was induced.  The chest was prepped and draped in the  usual sterile fashion.  Incision was made through the patient's  preexisting scar.  It was carried through the skin and subcutaneous  tissue.  Hemostasis was achieved with electrocautery.  The subcutaneous  fatty  tissue overlying the wires was dissected exposing the sternal  wires.  The wires then were removed.  All wires were intact and removed  completely.  The underlying bone was well healed with no signs of  instability.  The wound was irrigated with warm saline.  The incision  was closed in two layers with a 2-0 Vicryl subcutaneous suture and a 3-0  Vicryl subcuticular suture.  The patient was extubated in the operating  room and taken to the postanesthetic care unit in good condition.      Salvatore Decent Dorris Fetch, M.D.  Electronically Signed     SCH/MEDQ  D:  12/31/2007  T:  01/01/2008  Job:  [id number]

## 2010-06-05 NOTE — Cardiovascular Report (Signed)
[name], [name] NO.:  [id number]   MEDICAL RECORD NO.:  [id number]          PATIENT TYPE:  OIB   LOCATION:  2899                         FACILITY:  [facility]   PHYSICIAN:  Francisca December, M.D.  DATE OF BIRTH:  02-25-1950   DATE OF PROCEDURE:  03/18/2008  DATE OF DISCHARGE:  03/18/2008                            CARDIAC CATHETERIZATION   PROCEDURES PERFORMED:  1. Left heart catheterization.  2. Left ventriculogram.  3. Ascending aortogram.  4. Coronary angiography.  5. Saphenous vein graft angiography.  6. Arterial conduit angiography.   INDICATIONS:  [name] is an unfortunate 60 year old male who is now  7 months status post emergent CABG after presenting with a non-ST  elevated myocardial infarction in July 2009.  Since his catheterization,  he has done relatively poorly and over the past 3 months has had  persistent chest discomfort consistent with angina pectoris that is  severely limiting.  He has also had difficult to control high blood  pressure.  A myocardial perfusion study done in the office showed  reversible inferior defect.  He is brought to catheterization laboratory  at this time to identify the extent of disease and provide further  therapeutic options.   PROCEDURE NOTE:  The patient was brought to the cardiac catheterization  laboratory in a fasting state.  The right groin was prepped and draped  in the usual sterile fashion.  Local anesthesia was obtained with  infiltration of 1% lidocaine.  A 5-French catheter sheath was inserted  percutaneously into the right femoral artery utilizing an anterior  approach over guiding J-wire.  The patient has a known thoracic aortic  aneurysm with non-propagating dissection.  Therefore, the ascending  aorta was entered with a Wholey wire and then a long 260-cm J-wire was  used for catheter exchanges.  Left heart catheterization and coronary  angiography was proceeded otherwise in a standard  fashion using 5-French  #4 left and right Judkins catheters and 110-cm pigtail catheter.  A 30-  degree RAO cine left ventriculogram was performed utilizing a power  injector, 45 mL were injected at 13 mL per second.  After pullback of  the pigtail catheter into the aorta, a 55-degree LAO cine angiogram of  the descending aorta and arch was performed again with a power injector,  40 mL were injected at 15 mL per second.  It should be noted that the  patient received intracoronary nitroglycerin both in the left and right  coronary prior to the initiation of angiography.  The right coronary  catheter was used to perform angiography of the saphenous vein graft to  the diagonal and ramus, as well was used to perform angiography of the  right coronary graft which was occluded and the LIMA graft to the LAD.  It was then exchanged over a long guiding J-wire for a 5-French LCB  catheter.  Cineangiography of the saphenous vein graft to the OM was  then performed in LAO and RAO projections.  It also was occluded.   At the completion of the procedure, the catheter and  catheter sheath  were removed.  Hemostasis was achieved by direct pressure.  The patient  was transported to the recovery area in stable condition with an intact  distal pulse.   HEMODYNAMICS:  Systemic arterial pressure was 122/70 with a mean of 91  mmHg.  There was no systolic gradient across the aortic valve.  The left  ventricular end-diastolic pressure was 6 mmHg pre-ventriculogram.   ANGIOGRAPHY:  The left ventriculogram demonstrated normal chamber size  and normal global systolic function.  No specific regional wall motion  abnormality is seen.  A visual estimated ejection fraction is 60%.  Mild  mitral regurgitation is seen.  The aortic valve is trileaflet, opens  normally during systole.   The ascending aortogram demonstrated an aneurysmal segment beginning  after the left subclavian.  This continued well into the thoracic  aorta.  A questionable dissection plane is seen in the descending aorta medial  aspect, non-propagating and certainly nonocclusive.  The aortic root  appears normal in size and there is no aortic insufficiency.   The left coronary artery and its branches are highly diseased.  There is  a right dominant coronary system present.  The main left coronary artery  is not significantly focally obstructed, but is diffusely diseased.   The left anterior descending artery and its branches are highly  diseased; the vessel has a proximal 30% stenosis and then is completely  occluded after the origin of the first diagonal branch.  There is a  large septal perforator that provides left-to-right collateral filling  to the distal right coronary.   The circumflex coronary artery and its branches are highly diseased; the  vessel is diffusely diseased and contains a 95% focal stenosis in the  midportion.  The distal vessel though is quite small giving rise only to  small posterolateral branch and the AV groove vessel.  The first  marginal branch is diffusely diseased and not greater than 1.5 mm in  diameter.  I can identify no competitive filling.  The ramus intermedius  branch is occluded in the midportion.   The right coronary artery and its branches are highly diseased; the  vessel is extensively and severely diseased.  This is throughout the  proximal, mid, and distal segments.  There is a diffuse 50% stenosis in  the proximal segment.  There is a tubular 80% stenosis in the mid  section and then the distal section contains tandem 80% stenoses which  appeared to be irregular and ulcerative in nature.  The posterior  descending artery appears to be occluded proximally.  The posterolateral  segment is highly diseased and gives rise to 2 moderate-sized left  ventricular branches, both of which have high-grade stenoses in the  proximal segments.   SAPHENOUS VEIN GRAFT ANGIOGRAPHY:  The saphenous vein  graft to the right  coronary artery is completely occluded at its origin.   The graft to the diagonal and ramus intermedius appears to be a Y  graft.  The segment to the diagonal branch is widely patent and  provides retrograde and antegrade filling.  Retrograde filling enters  the LAD.  The saphenous vein graft portion to the ramus is widely patent  and inserts upon the vessel without obstruction.  It provides antegrade  filling only which is demonstrating a large bifurcating vessel reaches  the apical lateral wall.  It does appear that the free RIMA graft is  anastomosed to the proximal portion of the saphenous vein graft creating  the Y graft.  The saphenous vein graft to OM is completely occluded at its origin.   The LIMA graft to the LAD is widely patent and inserts upon the mid to  distal LAD.  It provides antegrade and retrograde filling.  Antegrade  filling reaches and traverses the apex.  Retrograde filling reaches the  midportion of the LAD where there is a trivial degree of what appears to  be competitive flow from the diagonal.   FINAL IMPRESSION:  1. Atherosclerotic cardiovascular disease, severe three-vessel.  2. Intact left ventricular size and global systolic function, ejection      fraction 60% without regional wall motion.  3. Mild mitral regurgitation.  4. Thoracic aortic aneurysm initiating at the origin of the left      circumflex with minimal visible dissections seen.  5. Occluded saphenous vein graft to the right coronary and saphenous      vein graft to the obtuse marginal.  6. Patent saphenous vein graft to the diagonal and patent free left      internal mammary artery to the ramus.  These conduits form a Y      graft.  7. Occluded saphenous vein graft to the obtuse marginal.   PLAN/RECOMMENDATIONS:  Unfortunately, it does not appear that a  percutaneous option is available for revascularization of the right  coronary artery.  The disease is too  extensive and there is significant  distal vessel disease.  The significant obstruction in the left  circumflex would not improve the myocardial perfusion to any great  degree given its distal location of small distal vessels as well.  Therefore, medical therapy only is indicated.  The patient has been  recently initiated on amlodipine with an improvement in control of  systemic hypertension.  It should also help with his angina pectoris.  We will consider addition of isosorbide long acting when the patient  returns to the office.  He will be discharged as an outpatient without  change in his medications at this time.      Francisca December, M.D.  Electronically Signed     JHE/MEDQ  D:  03/18/2008  T:  03/19/2008  Job:  [id number]   cc:   [name], M.D.

## 2010-06-05 NOTE — Consult Note (Signed)
[name], [name] NO.:  [id number]   MEDICAL RECORD NO.:  [id number]          PATIENT TYPE:  EMS   LOCATION:  MAJO                         FACILITY:  [facility]   PHYSICIAN:  Salvatore Decent. Dorris Fetch, M.D.DATE OF BIRTH:  [birth year]   DATE OF CONSULTATION:  07/28/2007  DATE OF DISCHARGE:                                 CONSULTATION   REASON FOR CONSULTATION:  The patient with unstable angina with history  of type 3 aortic dissection.   HISTORY OF PRESENT ILLNESS:  [name] is a 60 year old gentleman with a  history of hypertension who had a type 3 aortic dissection in September  2008.  He had no malperfusion or other complications, was managed  conservatively with medical therapy.  I last saw him in the office in  March 2009, at which time he was having some exertional pain symptoms,  exertional back pain, and occasional exertional indigestion.  I advised  him at that time to consider cardiac workup.  His symptoms remained  fairly stable until Sunday when he developed severe pain in his shoulder  blade that radiated into his arms in the setting of exertion.  These  symptoms were relieved by rest, but he continued to have waxing and  waning symptoms with even minimal exertion and also complained of severe  exertional indigestion.  He came to the emergency room, a CT of the  chest was done, which showed no change in the size of the aortic  dissection, although, radiologist felt that the false lumen was slightly  increased in size relative to the true lumen.  He refused it.  His  cardiac enzymes; however, did show elevated troponin of 0.27, his CK was  94 with MB of 2.7.  He was advised to be admitted at that time, but  refused.  He then presented to the emergency room on Monday evening, but  at that time refused to be worked up again and then went home.  He  called Dr. Henriette Combs office today, he was seen and was admitted directly  from the office.  He currently is pain  free.   PAST MEDICAL HISTORY:  1. Type 3 aortic dissection.  2. Hypertension.  3. Chronic back pain.  4. Previous lumbar spine fusion.  5. Hiatal hernia.  6. Schatzki's ring.  7. Previous right shoulder surgery.  8. Ulcer.  9. Hypercholesterolemia.   CURRENT MEDICATIONS:  Her medications at time of admission are;  1. Norvasc 5 mg daily.  2. Hydrochlorothiazide 25 mg daily.  3. Oxycodone 10 mg q.6.   ALLERGIES:  He has an adverse reaction to codeine with nausea, vomiting,  no true drug allergies.   FAMILY HISTORY:  Significant for both parents dying in their 61s of MI.   SOCIAL HISTORY:  He has a history of less than one-pack per day smoking.  He does not use alcohol or drugs.  He still works as a Curator.   REVIEW OF SYSTEMS:  See HPI.  He also has chronic back pain.  All other  systems are negative.   PHYSICAL  EXAMINATION:  GENERAL:  [name] is a well-appearing 59 year old  white male.  He is well-developed and well-nourished, in no acute  distress.  VITAL SIGNS:  Blood pressure 106/68, pulse 61, respirations are 16,  oxygen saturation is 97% on room air.  NEUROLOGICALLY:  Nonfocal.  HEENT:  Unremarkable.  NECK:  Supple without thyromegaly or bruits.  CARDIAC:  Regular rate and rhythm.  Normal S1 and S2.  No murmurs.  ABDOMEN:  Benign.  LUNGS:  Clear.  His pulses are 2+ and intact throughout.  He has brisk  capillary refill.   LABORATORY DATA:  Cholesterol is 234, HDL 26, LDL 183, cardiac enzymes  today showed a CK of 72, MB of 1.75 and troponin of 0.19, and sodium is  135, potassium 3.3, BUN and creatinine 8 and 0.9, glucose 125, peak  troponin was 0.27 on July 27, 2007.  CT scans as previously dictated.   IMPRESSION:  [name] is a 61 year old gentleman with a type 3  dissection, which occurred in September 2008.  He is now 9-10 months out  from that event with no problems in terms of aortic dilatation or  malperfusion.  He does still have flow in the false lumen,  by CT angio,  so he is at increased risk for late aneurysmal dilatation. However, his  current symptoms as well as the symptoms he complained about in March  are much more consistent with angina and his current symptoms are  consistent with unstable angina and I agree with plan for cardiac  catheterization.  His type 3 dissection does complicate that matters in  terms of the catheterization and would favor avoiding the descending  aorta if at all possible by using a right brachial artery approach,  however for technical reason this was not possible groin could be used  if absolutely necessary.  Given that he has no evidence of leak and his  symptoms are exertional, albeit with minimal exertion, anticoagulation  can be used as necessary, but would try to remain conservative as  possible.      Salvatore Decent Dorris Fetch, M.D.  Electronically Signed     SCH/MEDQ  D:  07/28/2007  T:  07/29/2007  Job:  [id number]   cc:   Francisca December, M.D.  Danise Edge, M.D.

## 2010-06-05 NOTE — Discharge Summary (Signed)
[name], [name] NO.:  [id number]   MEDICAL RECORD NO.:  [id number]         PATIENT TYPE:  CINP   LOCATION:                               FACILITY:  [facility]   PHYSICIAN:  Salvatore Decent. Dorris Fetch, M.D.DATE OF BIRTH:  May 07, 1950   DATE OF ADMISSION:  07/28/2007  DATE OF DISCHARGE:                               DISCHARGE SUMMARY   ADMITTING DIAGNOSES:  1. History of type 3 aortic dissection.  2. Multivessel coronary artery disease.  3. History of hypertension.  4. History of hypercholesterolemia.  5. History of chronic back pain.   DISCHARGE DIAGNOSES:  1. History of type 3 aortic dissection.  2. Multivessel coronary artery disease.  3. History of hypertension.  4. History of hypercholesterolemia.  5. History of chronic back pain.  6. Fever.  7. Sternal drainage.   PROCEDURES:  1. Cardiac catheterization performed by Dr. Amil Amen on July 29, 2007.      Findings showed a 20% stenosis of the left main, 30%-40% proximal      tubular stenosis of the first diagonal, 70%-80% stenosis of the mid      portion of the LAD also involves first septal perforator and second      diagonal, 70%-80% stenosis of the left circumflex and its branches,      90%-95% stenosis of OM-3, 50% stenosis of the proximal RCA, 70% of      the mid portion of the RCA, and the distal portion contains 2      stenoses 50% and 70% respectively, and acute marginal had a diffuse      80% stenosis as well with preserved EF of 60%.  2. Aortocoronary bypass x6 (LIMA to LAD).  3. Free right internal mammary artery to ramus intermedius, SVG to OM,      SVG to diagonal, and sequential saphenous vein graft to posterior      descending artery and posterolateral by Dr. Dorris Fetch on July 31, 2007.   HISTORY OF PRESENTING ILLNESS:  This is a 60 year old Caucasian male who  was found to have a type 3 aortic dissection in September 2008.  He has  been managed conservatively with medical therapy.  He  also has a past  medical history of hypertension, hypercholesterolemia, and chronic back  pain.  According to medical records in March 2009, the patient was  having some exertional chest pain symptoms as well as exertional  discomfort in his back and neck.  He was advised to undergo a cardiac  workup.  Currently, he refused.  According to medical records, he  presented to the emergency room complaining a severe episode of chest  discomfort with minimal exertion.  A thorough workup was undertaken.  A  CT of the chest was done, which showed no change in the size of the  aortic dissection.  He was found to have elevated troponin of 0.27 and a  CK-MB of 2.7.  His total creatine kinase was 94.  It was recommended  that he be admitted for further workup, but he left against  medical  advice.  He then returned to the emergency room the following day with  similar symptomatology as before and again he refused further workup and  left against medical advice.  He finally presented to Dr. Henriette Combs  office the following day.  He was admitted directly to [facility] where  he underwent cardiac catheterization and was found to have the  aforementioned coronary artery disease and subsequently underwent  coronary bypass grafting surgery by Dr. Dorris Fetch.   HOSPITAL COURSE STAY:  The patient was extubated the evening of surgery.  Chest tube was removed on August 02, 2007.  Followup chest x-ray revealed  no pneumothorax.  There was bibasilar atelectasis as well as small  bilateral pleural effusions.  The patient was weaned off his drips as  tolerated.  He was volume overloaded and diuresed accordingly.  He was  then transferred to 2000 on postop day #3 for further convalescence.   The patient then subsequently developed a fever of 101.4 on  postoperative day #4.  He was found to have some sternal drainage and  questionable fat necrosis.  His sternum, however, was stable.  He was  placed on IV Avelox.  His  sternal drainage continued to decrease over  the next couple of days, and currently on postop day #6 the patient had  T-max of 99.7, but later became afebrile.  His vital signs were stable.  His preoperative weight is 90 kg, today's weight is 84.6 kg.   PHYSICAL EXAM:  CARDIOVASCULAR:  Regular rate and rhythm.  PULMONARY:  Clear.  No rales, wheezes, or rhonchi.  EXTREMITIES:  No real edema left lower extremity.  Trace right lower  extremity edema.  Right lower extremity wound is clean and dry.  There  is decreased drainage and erythema of his lower sternal wound provided  he remains afebrile and the sternal wound continues to heal.  He will be  discharged when he is switched from IV Avelox to p.o. and hopefully this  will be on August 07, 2007.   Discharge instructions include the following:  The patient is not to  drive or lift more than 10 pounds.  He is to continue with his breathing  exercise daily.  He is to walk daily.  He is to increase his frequency  and duration as tolerated.  He is to remain on a low-fat, low-salt diet.   His followup appointments include an appointment with Dr. Amil Amen on  August 20, 2007, and Dr. Dorris Fetch on August 24, 2007.  Prior to this  appointment, a chest x-ray will be obtained.  It should also be noted  that a home health is going to be arranged to continue with the dressing  changes and continued evaluation of the sternal wound.   Discharge medications include the following:  ECASA 325 p.o. daily,  lisinopril 10 mg p.o. daily, Lipitor 40 mg p.o. at bedtime, Lopressor 25  mg p.o. two times daily, and Percocet 10/325 mg one tablet p.o. q.4-6 h.  as needed for pain in addition to p.o. antibiotic most likely Avelox.      [name], PA      [name], M.D.  Electronically Signed    DZ/MEDQ  D:  08/06/2007  T:  08/07/2007  Job:  [id number]   cc:   Francisca December, M.D.

## 2010-06-05 NOTE — H&P (Signed)
NAMEGARRETH, [name] NO.:  [id number]   MEDICAL RECORD NO.:  [id number]          PATIENT TYPE:  EMS   LOCATION:  ED                           FACILITY:  [facility]   PHYSICIAN:  Valetta Mole. Swords, MD    DATE OF BIRTH:  1950/04/09   DATE OF ADMISSION:  09/28/2006  DATE OF DISCHARGE:                              HISTORY & PHYSICAL   CHIEF COMPLAINT:  Back and chest pain.   HISTORY OF PRESENT ILLNESS:  The patient is a 60 year old gentleman who  was in his usual state of good health until yesterday evening.  At that  time he developed left sided substernal chest pain with radiation to the  back.  The pain was quite intense, labeled 10 out of 10.  The pain was  so intense, he could not get out of the chair and walk.  He came to the  emergency department for further evaluation.  He had no associated  shortness of breath, no PND.  Denies any nausea or vomiting.  He  describes the pain as sharp and acute.  He had no cough, fever or  chills.  No other associated or modifying symptoms.   PAST MEDICAL HISTORY:  Significant for back pain.  Underwent a lumbar  fusion on 06/24/2006.   MEDICATIONS:  Include hydrocodone p.r.n.   ALLERGIES:  NONE.   SOCIAL HISTORY:  He works for an [employer] person [employer] in  [employer].  He is not married, smokes one pack per day.  He  does not drink alcohol.   FAMILY HISTORY:  Mother and father are both deceased of myocardial  infarction, both at age 85.   REVIEW OF SYSTEMS:  Completely unremarkable other than those listed  above.   PHYSICAL EXAMINATION:  VITAL SIGNS:  Initial blood pressure 160/101,  present blood pressure 113/68.  Pulse currently 70, respirations 14,  temperature 97.6.  GENERAL:  Well developed, well nourished male, in no acute distress.  HEENT:  Normocephalic, atraumatic.  Extraocular muscles are intact.  NECK:  Supple without lymphadenopathy or thyromegaly.  No jugular venous  distention or carotid  bruits.  Carotid upstrokes are full and normal.  CHEST:  Clear to auscultation without any increased work of breathing.  CARDIAC:  S1, S2 are regular without murmurs or gallops.  ABDOMEN:  Active bowel sounds.  Soft, nontender.  There is no  hepatosplenomegaly.  No masses are palpated.  No abdominal bruits.  EXTREMITIES:  There is no clubbing, cyanosis or edema.  Peripheral  pulses including femoral, popliteal, dorsalis pedis and posterior tibial  pulses are all normal.  NEUROLOGIC EXAM:  He is alert and oriented without any motor or sensory  deficits.  There is full range of motion of his hips.  Cranial nerves  are intact.   LABORATORY DATA:  Cardiac enzymes are normal.  BMET is normal except for  a sodium of 134, glucose 110.  Protime is normal with an INR of 0.9.  CBC is normal except for a white count of 11.8.   CT scan demonstrates type 2  aortic dissection.   EKG demonstrates normal sinus rhythm with left axis and probable left  anterior fascicular block.   ASSESSMENT/PLAN:  1. Type B aortic dissection.  This will probably require medical      management.  Thoracic surgery has already been consulted by Dr.      Deretha Emory in the emergency department.  I agree with that consult.      We will continue cardiac enzymes q 8 hours times three. We will      check serial CBC's.  Treatment for now will be pain control and      blood pressure control with beta blockers and ACE inhibitors.  2. Tobacco abuse.  The patient is advised to quit.  3. Mild hyperglycemia that can be followed as an outpatient.  4. Leukocytosis likely secondary to his acute illness.   The patient will be monitored in the ICU, given the severity of his  illness.      Bruce Rexene Edison Swords, MD  Electronically Signed     BHS/MEDQ  D:  09/28/2006  T:  09/28/2006  Job:  [id number]   cc:   Danise Edge, M.D.  Fax: [phone]   Sigmund I. Patsi Sears, M.D.  Fax: [phone]

## 2010-06-05 NOTE — Assessment & Plan Note (Signed)
OFFICE VISIT   [name], [name]  DOB:  [birth year]                                        September 12, 2009  CHART #:  [id number]   HISTORY:  The patient is a 60 year old gentleman with a coronary bypass  grafting x6 on August 08, 2007.  He had previously had an aortic type 3  dissection in September 2008.  He is followed by Dr. Amil Amen for his  cardiac issues and saw him about a month ago.  He continues to be  followed by Dr. Laural Benes who is primarily managing his medications.   The patient states that since his last visit he still feels tired all  the time.  His pain is will managed at the present time.  He is not  having any sternal incisional pain or any back pain except for his  chronic long-standing back pain and neck pain from his previous  accident.  He states that he was started back on amlodipine, the  medication at one time had been discontinued.  He does say that he was  hospitalized with dizziness recently and diagnosed with __________.   PAST MEDICAL HISTORY:  Significant for coronary artery disease status  post coronary bypass grafting x6 complicated by cardiac tamponade after  wire removal necessitating repeat sternotomy, sternal wire removal in  December 2009, chronic pain, hypertension, dyslipidemia, tobacco abuse,  hiatal hernia, Schatzki ring, peptic ulcer disease, and  hypercholesterolemia.   CURRENT MEDICATIONS:  1. Amlodipine 10 mg daily.  2. Toprol-XL 25 mg daily.  3. Oxycodone 5 mg 1-2 tablets q.6 h.  4. Enteric-coated aspirin 325 mg daily.  5. Crestor 10 mg daily.  6. Isosorbide 20 mg b.i.d.  7. Zetia 10 mg daily.   ALLERGIES:  He has no known drug allergies.   REVIEW OF SYSTEMS:  Complains of chronic fatigue and chronic pain.  No  anginal-type chest pains.  No complaints suggestive of claudication.   PHYSICAL EXAMINATION:  The patient is a 60 year old gentleman in no  acute distress.  Blood pressure is 154/88, pulse 58,  respirations are  18, his oxygen saturation is 94% on room air.  Neurologically, he has a  flat affect and appears depressed.  His cardiac exam has a regular rate  and rhythm.  Normal S1 and S2.  No rubs, murmurs, or gallops.  Sternal  incision is well healed.  Sternum is stable.  Lungs are clear with equal  breath sounds bilaterally.  He has 2+ pulses throughout.   DIAGNOSTIC TESTS:  MR angio is reviewed and shows no change in his type  3 dissection beginning just beyond the subclavian and going into the  intra-abdominal aorta.  There is still flow in both the true and false  lumens.   IMPRESSION:  The patient is a 60 year old gentleman with multiple  medical problems.  I think his primary problem right now is depression.  He seems very down, has a lack of energy and flat affect.  Blood  pressure control remains problematic.  He was recently restarted on  amlodipine, so I will not make any changes on that today.  Essentially  leaving the medication management up to Dr. Laural Benes and Amil Amen.  He  needs to continue to be followed given he has patency of both true and  false lumens to rule out late aneurysmal  dilatation of his type 3  dissection.  I will plan to see him back in a year with an MR angio to  check on his progress.   Salvatore Decent Dorris Fetch, M.D.  Electronically Signed   SCH/MEDQ  D:  09/12/2009  T:  09/13/2009  Job:  [id number]   cc:   Danise Edge, M.D.  Francisca December, M.D.

## 2010-06-05 NOTE — Discharge Summary (Signed)
NAMEFUE, CERVENKA NO.:  [id number]   MEDICAL RECORD NO.:  [id number]           PATIENT TYPE:   LOCATION:                                 FACILITY:   PHYSICIAN:  Salvatore Decent. Dorris Fetch, M.D.DATE OF BIRTH:  01-Feb-1950   DATE OF ADMISSION:  DATE OF DISCHARGE:                               DISCHARGE SUMMARY   ADDENDUM   ADDITIONAL DISCHARGE DIAGNOSES:  1. Acute cardiac tamponade.  2. Bleeding from saphenous vein graft to the right coronary artery.   ADDITIONAL PROCEDURE:  1. Subxiphoid pericardial window.  2. Mediastinal re-exploration.  3. Repair of bleeding from saphenous vein graft to the right coronary      artery.   Mr. Caffrey was originally scheduled for potential discharge home on August 07, 2007.  He was evaluated on morning rounds and was noted to have a  recurrent fever of 102 the previous evening.  His sternal drainage has  actually significantly improved; however, it was felt prudent to keep  him an additional length of stay in order to further evaluate his  sternal wound.  He did undergo a CT of the chest, which showed no  significant findings aside from small bilateral pleural effusions and  some bibasilar atelectasis.  He was continued on vancomycin and Avelox  and monitored closely.  On August 08, 2007, his pacing wires were  discontinued, and shortly after this procedure was performed, the  patient developed acute onset of dyspnea with shocky appearance.  He was  immediately transferred back to the SICU and was started on IV fluids, a  non-rebreather mask, and a 2-D echocardiogram was performed.  This  showed a large pericardial effusion.  Dr. Dorris Fetch and Dr. Donnie Aho  both reviewed the echo and examined the patient and felt that he should  be taken emergently to the operating room for an unstable cardiac  tamponade.  He was returned to the OR on August 08, 2007, for mediastinal  exploration, subxiphoid pericardial window, and drainage of  cardiac  tamponade.  Intraoperative findings included a clotted pericardial  effusion as well as bleeding from a vein graft to the right coronary  artery distribution and bilateral pleural effusions, right slightly  greater than left.  The patient tolerated the procedure well and was  transferred back to the SICU in stable condition.  Since his return to  the operating room, he has recovered nicely.  He remained  hemodynamically stable and was observed for 24 hours in the unit prior  to being transferred back to the floor.  He was continued on IV  antibiotics, and at this point has completed a 7-day course of both  vancomycin and Avelox.  He has remained afebrile and his sternal  drainage has nearly resolved.  At this point, he is having very minimal  bloody drainage and no evidence of purulence or erythema from the  sternal wound.  His sternum has remained stable.  All chest tubes have  been removed.  He is tolerating a regular diet and ambulating in the  halls independently and is  making good progress.   His most recent labs show a hemoglobin of 10.1, hematocrit 29.8, white  count 8.9, and platelets 439,000.  Sodium 131, potassium 4.0, BUN 8, and  creatinine 1.07.   He will be observed for an additional 24 hours, but if he remains stable  it is anticipated he will be discharged home on August 13, 2007.   Discharge instructions are unchanged from the previously dictated  discharge summary.   Discharge medications are also unchanged from the previously dictated  discharge summary; however, he will not require further antibiotics at  home.  He will follow up as directed with Dr. Dorris Fetch on August 24, 2007, and has a followup scheduled with Dr. Amil Amen on August 27, 2007.  In the interim, if he experiences any problems or has questions, he is  asked to contact our office.       Coral Ceo, P.A.      Salvatore Decent Dorris Fetch, M.D.  Electronically Signed    GC/MEDQ  D:   08/12/2007  T:  08/13/2007  Job:  [id number]   cc:   Francisca December, M.D.  [facility]

## 2010-06-05 NOTE — Op Note (Signed)
NAMEBARTT, GONZAGA NO.:  [id number]   MEDICAL RECORD NO.:  [id number]          PATIENT TYPE:  OBV   LOCATION:  5735                         FACILITY:  [facility]   PHYSICIAN:  Danise Edge, M.D.   DATE OF BIRTH:  Sep 15, 1950   DATE OF PROCEDURE:  10/17/2006  DATE OF DISCHARGE:  10/17/2006                               OPERATIVE REPORT   ESOPHAGOGASTRODUODENOSCOPY:   PROCEDURE INDICATION:  [name] is a 60 year old male born May 18, 1950.   In November 1987, he underwent an esophagogastroduodenoscopy which  revealed a Schatzki ring and a hiatal hernia.  His serum gastrin was  normal.   In 1998 his esophagogastroduodenoscopy revealed a pyloric channel ulcer  which was CLO-test-negative.   [name] was admitted to the hospital October 14, 2006, with epigastric  discomfort, nausea and vomiting.  His barium swallow upper GI x-ray  series reveals a possible mass in the distal duodenal bulb.   MEDICAL HISTORY:  1. Recent descending aortic dissection.  2. Lumbar spine fusion surgery, June 2008.  3. Normal colonoscopy in May 1997.  4. Shoulder surgery in 1991.  5. Hypercholesterolemia.   MEDICATION ALLERGIC:  None.   HABITS:  [name] smokes a pack of cigarettes per day but does not  consume alcohol or nonsteroidal anti-inflammatory medication.   FAMILY HISTORY:  Positive for coronary artery disease.   ENDOSCOPIST:  Danise Edge, M.D.   PREMEDICATION:  Fentanyl 200 mcg, Versed 7 mg.   PROCEDURE:  After obtaining informed consent, [name] was placed in the  left lateral decubitus position.  I administered intravenous fentanyl  and intravenous Versed to achieve conscious sedation for the procedure.  The patient's blood pressure, oxygen saturation and cardiac rhythm were  monitored throughout the procedure and documented in the medical record.   The Pentax gastroscope was passed through the posterior hypopharynx into  the proximal esophagus  without difficulty.  The hypopharynx, larynx and  vocal cords appeared normal.   Esophagoscopy:  The proximal, mid and lower segments of the esophageal  mucosa appeared normal.  The squamocolumnar junction and esophagogastric  junction are noted at approximately 42 cm from the incisor teeth.   Gastroscopy:  Retroflex view of the gastric cardia and fundus was  normal.  The gastric body, antrum and pylorus appear normal.  The  pyloric channel is slightly strictured.   Duodenoscopy:  The duodenal bulb appears normal.  At the junction of the  duodenal bulb and second portion of the duodenum the mucosa is red and  edematous but I do not detect a small bowel tumor, mass or stricture.  I  do not identify an active ulcer.  The second portion of the duodenum and  third portion of the duodenum is otherwise normal.   ASSESSMENT:  Normal esophagogastroduodenoscopy except for edematous,  friable mucosa at the junction of the distal duodenal bulb and second  portion of the duodenum.   PLAN:  Continue [name] on his proton pump inhibitor.           ______________________________  Danise Edge, M.D.     MJ/MEDQ  D:  10/17/2006  T:  10/18/2006  Job:  [id number]

## 2010-06-05 NOTE — Cardiovascular Report (Signed)
NAMEALVERTO, SHEDD NO.:  [id number]   MEDICAL RECORD NO.:  [id number]          PATIENT TYPE:  INP   LOCATION:  2919                         FACILITY:  [facility]   PHYSICIAN:  Francisca December, M.D.  DATE OF BIRTH:  1950/06/11   DATE OF PROCEDURE:  07/29/2007  DATE OF DISCHARGE:  07/28/2007                            CARDIAC CATHETERIZATION   PROCEDURES PERFORMED:  1. Left heart catheterization.  2. Left ventriculogram.  3. Coronary angiography.  4. Aortic arch angiography.   INDICATIONS:  [name] is an unfortunate 60 year old man who has a  type 3 aortic dissection identified by CT scanning while undergoing  evaluation for chronic upper and lower back pain.  He has been under  observation by the cardiothoracic surgeons for the past 9 months without  significant progression, although a CT scan done yesterday for chest  pain and upper back pain did show an increase of the size of the false  lumen without increase in overall aortic size.  He has presented now  with classic symptoms of unstable and progressive angina pectoris.  This  includes arm, upper back, and chest discomfort with minimal exertion.  He has had minimally elevated troponins and negative CK-MBs.  ECG has  been nonacute.  The patient is brought to cardiac catheterization  laboratory at this time to identify the extent of disease and provide  for further therapeutic options.   PROCEDURE NOTE:  The patient is brought to cardiac catheterization  laboratory in a fasting state.  The right brachial antecubital region  was prepped and draped in the usual sterile fashion.  Local anesthesia  was obtained with infiltration of 1% lidocaine.  A 6-French catheter  sheath was inserted percutaneously into the right brachial artery  utilizing an anterior approach over guiding J-wire.  This was a short  sheath and it was sutured into place.  Left heart catheterization and  coronary angiography then proceeded  in the standard fashion using 6-  French 110-cm pigtail catheter and a 6-French #2 Castillo catheter for  the left coronary angiogram and a #1 Castillo catheter for the right  coronary artery.  All catheter manipulations were performed using  fluoroscopic observation and over a long guiding J-wire.  A pullback  pressure across the aortic valve was obtained.  A third-degree RAO cine  left ventriculogram was performed utilizing a power injector.  39 mL  were injected at 13 mL per second.  As well a 50-degree LAO arch  angiogram was obtained with a power injector.  45 mL were injected at 13  mL per second.  At the completion of procedure, the catheter and  catheter sheath were removed.  Hemostasis was achieved by direct  pressure.  The patient was transported to the recovery area in stable  condition.   HEMODYNAMICS:  Systemic arterial pressure was 109/67 with a mean of 84  mmHg.  There was no systolic gradient across the aortic valve.  The left  ventricular end-diastolic pressure was 4 mmHg pre-ventriculogram.   Angiography.  The left ventriculogram demonstrated normal chamber size  and normal global systolic function without regional wall motion  abnormality.  A visual estimate of the ejection fraction is 60%.  There  is no mitral vegetation.  The aortic valve is trileaflet and opens  normally during systole.   The arch aortogram demonstrated a normal ascending aorta and a  trileaflet aortic valve without aortic insufficiency.  The origins of  the innominate, right carotid, and the left subclavian were easily  identified and without any obvious obstruction.  There was a dissection  with a false lumen that arose from the medial aspect of the aorta  beginning approximately 5-6 cm beyond the origin of the subclavian.  It  extended distally well into the thoracic cavity.  The distal portion was  seen to be mobile with the cardiac impulse.   There is a right-dominant coronary system  present.  The left main  coronary had a distal 20% stenosis.   The left anterior descending artery and its branches are severely  diseased; there is a proximal 30%-40% tubular stenosis that involves the  origin of the first diagonal branch.  The diagonal branch itself is  diffusely diseased without significant obstruction.  The ongoing  anterior descending then has a 70%-80% stenosis which is diffuse in the  midportion and involves the origin of the first septal perforator and  the second diagonal.  Again, no significant obstruction is seen in the  second diagonal.  The ongoing anterior descending artery reaches and  traverses the apex.   The left circumflex coronary artery and its branches are highly  diseased; the vessel gives rise to 2 small marginal branches both of  which have a 70%-80% stenosis at their origin.  The ongoing circumflex  in the midportion demonstrates a severe tubular 90%-95% stenosis which  involves the origin of the third marginal branch.  This is also  relatively small.  The ongoing circumflex then gives rise to a  posterolateral branch and has a diffuse 30% lesion in the distal  portion.   The right coronary and its branches are highly diseased; there is a  tubular 50% stenosis in the proximal segment and a tubular 70% stenosis  in the midportion.  The distal portion contains 2 tandem 50%-70%  stenoses.  There is a very tiny posterior descending artery and then the  first posterolateral branch which is small to moderate in size is  completely occluded in its proximal segment, does fill by trivial  antegrade filling.  The second posterolateral branch is moderate to  large in size and is without significant obstruction.  There is an acute  marginal that has a diffuse 80% stenosis that provides septal  perforators in the distal portion of the septum that is more apically.   Collateral vessels are not seen.   FINAL IMPRESSION:  1. Atherosclerotic coronary  vascular disease, severe 3-vessel.  2. Intact left ventricular size and global systolic function, ejection      fraction 60%.  3. Type 3 distal aortic dissection with obvious false lumen.   PLAN:  Dr. Dorris Fetch will review the films and certainly can strongly  consider coronary artery bypass grafting as it would be the most  appropriate treatment in the setting.  I am uncertain as to how the  aortic dissection will be handled with respect to an open chest  operation.  We will certainly be interested to discuss this further with  Dr. Dorris Fetch.      Francisca December, M.D.  Electronically Signed  JHE/MEDQ  D:  07/29/2007  T:  07/30/2007  Job:  [id number]   cc:   Salvatore Decent. Dorris Fetch, M.D.

## 2010-06-05 NOTE — Assessment & Plan Note (Signed)
OFFICE VISIT   JAKYREN, FLUEGGE  DOB:  [birth year]                                        December 04, 2007  CHART #:  [id number]   The patient is a 60 year old gentleman, who had coronary artery bypass  grafting done on July 18.  His pacing wires were removed and he  developed cardiac tamponade and had to have emergency taken back to the  operating room.  A branch had avulsed from a vein graft when the wires  were removed.  He eventually recovered from that and was discharged home  and was last seen in the office in August, at which time, he was doing  well.  He was still having a lot of back pain at that time and was still  having some incisional pain as well.  He was taking Percocet for his  back and that was helping his incisional pain.  He now returns because  of two separate pain syndrome.  He complains of pain along the left  lower costal margin particularly when he takes deep breath and he also  complains of pain along the right parasternal region.  He also feels a  clicking or popping sensation when he coughs in the right parasternal  region.   PHYSICAL EXAMINATION:  GENERAL:  The patient is a 60 year old white male  in no acute distress.  VITAL SIGNS:  His blood pressure is 109/73, pulse 61, respirations 18,  and his oxygen saturation is 98% on room air.  CHEST:  His sternal incision is clean, dry, intact, and well healed.  He  is relatively thin and the wires are easily palpable and are tender to  palpation just to the right at the incision.  His sternum is stable with  coughing, however, I do feel popping sensation posteriorly in his back  when he moves in certain fashion.  He also is tender to palpation along  the left costal margin.  There is no hernia.  This is actually removed  from the site and is consistent with costochondritis.   LABORATORY DATA:  Chest x-ray shows good aeration of the lungs  bilaterally.  The sternal wires are in place and  intact and unmoved from  his chest x-ray in August.   IMPRESSION:  The patient is a 60 year old gentleman, who has two  separate pain syndromes, both related to his previous median sternotomy  and has some costochondritis on the left side.  This is actually more  difficult problem because the primary treatment that would be with anti-  inflammatories.  However, he has tried anti-inflammatories previously  for his back pain and had severe problems with gastritis and does not  want to attempt anti-inflammatories for treatment of the  costochondritis.  He also has pain related to the sternal wires.  This  frequently will get better over time.  Some people, however, continue to  have pain there and if so, it is a very simple, straight and forward  procedure to go and remove the wires.  At this point, the bones are  healed and there is no down sided doing so other than needing an  operation.  It can easily be done on outpatient basis.  The patient at  this point in time, does not wish to have any surgical procedures done.  He will  come back if the pain becomes more bothersome to him and he  decides he would like to have his wires removed.   Salvatore Decent Dorris Fetch, M.D.  Electronically Signed   SCH/MEDQ  D:  12/04/2007  T:  12/05/2007  Job:  [id number]   cc:   Francisca December, M.D.

## 2010-06-05 NOTE — Discharge Summary (Signed)
[name], [name] NO.:  [id number]   MEDICAL RECORD NO.:  [id number]          PATIENT TYPE:  INP   LOCATION:  5730                         FACILITY:  [facility]   PHYSICIAN:  Danise Edge, M.D.   DATE OF BIRTH:  1950/12/26   DATE OF ADMISSION:  09/30/2006  DATE OF DISCHARGE:  10/09/2006                               DISCHARGE SUMMARY   DISCHARGE DIAGNOSES:  1. Descending aortic dissection.  2. Chronic low back pain with recent lumbar fusion surgery.  3. Resolved fever   DISCHARGE MEDICATIONS:  1. Labetalol 200 mg twice daily.  2. Hydrochlorothiazide 25 mg each morning.  3. Methadone 10 mg every 8 hours (#42).   HOSPITAL FOLLOW-UP:  [name] will see me in the office in approximately  2 weeks.  He will undergo an MRI of his aorta at 3 months, 6 months and  12 months.  [name] has a follow-up appointment with Dr. Trey Sailors in  October.   LABORATORY DATA:  CT angiography of the chest, and MR angiography of the  chest during the hospitalization revealed an acute type 2 aortic  dissection beginning just distal to the left subclavian artery and  extending to the celiac axis.  Admission electrocardiogram revealed  normal sinus rhythm with left anterior fascicular block.  RPR  nonreactive.  White blood cell count 8400, hemoglobin 11.3 grams,  platelet count normal.  Elevated sedimentation rate 92 mm per hour.  Basic metabolic profile normal.  Urinalysis normal.  Two blood cultures  were performed approximately 6 days ago during a temperature spike.  One  blood culture negative; the other blood culture grew a coagulase-  negative staph felt to be a skin contaminant.  Cardiac enzymes negative.   HOSPITAL COURSE:  [name] was admitted to [facility] on  September 28, 2006, with acute chest and upper back pain.  Admission CT  scan showed an acute descending aortic dissection.  Repeat MR angiogram  during the hospitalization showed no progression of the  aortic  dissection which extends just distal to the left subclavian artery down  to the celiac axis.  Peripheral pulses have remained intact.  His blood  pressure has been well controlled on labetalol and hydrochlorothiazide.   Approximately 6 days ago, he spiked a temperature to 101 degree.  Two  blood cultures were obtained.  One blood culture remains negative.  The  second blood culture grew a coagulase-negative staph.  [name] was  placed on vancomycin.  His case was reviewed by Dr. Enedina Finner from  infectious disease.  It was felt the positive blood culture was a skin  contaminant.  He was taken off antibiotics.  He has remained afebrile  except for one temperature up to 100 degrees.   He has had some back pain and leg heaviness during his hospitalization.  Dr. Trey Sailors evaluated [name] in the hospital and felt that no further  evaluation was necessary.  I have kept his pain under control with  methadone 10 mg three times daily.   [name]  is discharged in stable medical condition.  I will seen back in  the office in 2 weeks.  I will arrange a follow-up MRI at 3 months 6  months and 12 months and then yearly thereafter.           ______________________________  Danise Edge, M.D.     MJ/MEDQ  D:  10/09/2006  T:  10/09/2006  Job:  [id number]   cc:   Payton Doughty, M.D.  Salvatore Decent Dorris Fetch, M.D.

## 2010-06-05 NOTE — Assessment & Plan Note (Signed)
OFFICE VISIT   [name], [name]  DOB:  1950/12/22                                        January 04, 2008  CHART #:  [id number]   The patient is a 60 year old gentleman who had coronary artery bypass  grafting done on August 08, 2007.  He had chronic incisional pain since  that time and tried multiple different methods for controlling that, but  he continued to have severe pain, which is markedly limiting his  activities.  Last week, we did a sternal wire removal.  He states that  after removing the wires, the next day he felt 100% better and he is not  really having any pain in that area at all.  He is still troubled by  chronic back pain and some joint pain from previous surgeries in those  areas.   PHYSICAL EXAMINATION:  GENERAL:  The patient is a 60 year old white  male, in no acute distress.  VITAL SIGNS:  His blood pressure is 130/82, pulse 64, respirations are  16, and his ox saturation is 98% on room air.  His sternum is stable and  his sternal incision is healing well.   IMPRESSION:  The patient has had a very good response to removal of the  sternal wires.  He is having minimal discomfort in that area.  He will  call if he has any additional problems related to that.  He does have a  followup appointment with Dr. Amil Amen next week.   [name], M.D.  Electronically Signed   SCH/MEDQ  D:  01/04/2008  T:  01/05/2008  Job:  [id number]

## 2010-06-06 ENCOUNTER — Other Ambulatory Visit: Payer: Self-pay

## 2010-06-07 ENCOUNTER — Inpatient Hospital Stay: Admission: RE | Admit: 2010-06-07 | Payer: Self-pay | Source: Ambulatory Visit

## 2010-06-08 NOTE — Op Note (Signed)
[facility]  Patient:    [name], [name]                          MRN: [id number] Proc. Date: 03/19/00 Adm. Date:  03/19/00 Attending:  Artist Pais. Mina Marble, M.D.                           Operative Report  PREOPERATIVE DIAGNOSIS:  Tinea ______ , right ring finger.  POSTOPERATIVE DIAGNOSIS:  Tinea ______ , right ring finger.  PROCEDURE:  Nail bed ablation, right ring finger.  SURGEON:  Artist Pais. Mina Marble, M.D.  ASSISTANT:  Junius Roads. Ireton, P.A.C.  ANESTHESIA:  Monitored anesthesia care with 2% plain lidocaine, 0.25% plain Marcaine digital block performed by the surgeon.  COMPLICATIONS:  None.  DRAINS:  None.  SPECIMENS:  None.  DESCRIPTION:  Patient was taken to the operating room where after the induction of adequate IV sedation the right upper extremity was prepped and draped in the usual sterile fashion.  At this point in time 2 cc of a combination of 0.25% plain Marcaine and 2% plain lidocaine was injected into the digital sheath of the ring finger.  Once adequate anesthesia was obtained a 45 degree incision was made on the eponychial folds going from distal to proximal.  A flap was elevated off of the germinal matrix.  The entire germinal matrix was excised with a #15 blade down to the distal phalanx. Prior to this the nail plate had been removed using a Therapist, nutritional.  After the matrix had been removed the incisions were repaired using a 4-0 nylon suture with one suture into the nail bed to cover the distal phalanx.  The wound was then dressed with Xeroform, 4x4s, fluffs, and a compressive dressing and Coban wrap.  Patient tolerated the procedure well, went to recovery room in stable fashion.DD:  03/19/00 TD:  03/19/00 Job: [id number]

## 2010-06-10 ENCOUNTER — Ambulatory Visit
Admission: RE | Admit: 2010-06-10 | Discharge: 2010-06-10 | Disposition: A | Discharge status: Home / Self Care | Payer: Self-pay | Source: Ambulatory Visit | Attending: Cardiology | Admitting: Cardiology

## 2010-06-10 DIAGNOSIS — M549 Dorsalgia, unspecified: Secondary | ICD-10-CM

## 2010-06-10 MED ORDER — GADOBENATE DIMEGLUMINE 529 MG/ML IV SOLN
15.0000 mL | Freq: Once | INTRAVENOUS | Status: AC | PRN
  Administered 2010-06-10: 15 mL via INTRAVENOUS

## 2010-08-22 DIAGNOSIS — K279 Peptic ulcer, site unspecified, unspecified as acute or chronic, without hemorrhage or perforation: Secondary | ICD-10-CM

## 2010-08-22 HISTORY — DX: Peptic ulcer, site unspecified, unspecified as acute or chronic, without hemorrhage or perforation: K27.9

## 2010-09-06 ENCOUNTER — Other Ambulatory Visit: Payer: Self-pay | Admitting: Gastroenterology

## 2010-09-06 ENCOUNTER — Ambulatory Visit (HOSPITAL_COMMUNITY)
Admission: RE | Admit: 2010-09-06 | Discharge: 2010-09-06 | Disposition: A | Discharge status: Home / Self Care | Payer: Medicare PPO | Source: Ambulatory Visit | Attending: Gastroenterology | Admitting: Gastroenterology

## 2010-09-06 DIAGNOSIS — Z79899 Other long term (current) drug therapy: Secondary | ICD-10-CM | POA: Insufficient documentation

## 2010-09-06 DIAGNOSIS — D126 Benign neoplasm of colon, unspecified: Secondary | ICD-10-CM | POA: Insufficient documentation

## 2010-09-06 DIAGNOSIS — N200 Calculus of kidney: Secondary | ICD-10-CM | POA: Insufficient documentation

## 2010-09-06 DIAGNOSIS — I7100 Dissection of unspecified site of aorta: Secondary | ICD-10-CM | POA: Insufficient documentation

## 2010-09-06 DIAGNOSIS — R1013 Epigastric pain: Secondary | ICD-10-CM | POA: Insufficient documentation

## 2010-09-06 DIAGNOSIS — Z8711 Personal history of peptic ulcer disease: Secondary | ICD-10-CM | POA: Insufficient documentation

## 2010-09-10 ENCOUNTER — Encounter: Payer: Self-pay | Admitting: Thoracic Surgery (Cardiothoracic Vascular Surgery)

## 2010-09-13 NOTE — Op Note (Signed)
NAMERANELL, SKIBINSKI NO.:  [id number]  MEDICAL RECORD NO.:  [id number]  LOCATION:  [facility]                         FACILITY:  [facility]  PHYSICIAN:  Danise Edge, M.D.   DATE OF BIRTH:  1950/07/10  DATE OF PROCEDURE:  09/06/2010 DATE OF DISCHARGE:                              OPERATIVE REPORT   HISTORY:  [name] is a 60 year old male born 11-08-1950.  The patient has unexplained epigastric burning discomfort without vomiting or gastrointestinal bleeding.  He is currently taking omeprazole twice daily.  In 1997, the patient's screening colonoscopy was normal.  In 1998, his esophagogastroduodenoscopy showed a pyloric channel ulcer which was H. pylori negative.  In 2008, he underwent a normal esophagogastroduodenoscopy.  In November 2011, his abdominal ultrasound was normal.  In December 2011, CT scan of the abdomen and pelvis showed small right kidney stones and his chronic aortic dissection.  In May 2012, MRI of the chest showed stability in his descending aortic dissection without aneurysmal formation.  The patient is scheduled to undergo a diagnostic esophagogastroduodenoscopy and screening colonoscopy.  The patient's colon was inadequately prepped for a screening colonoscopy.  A flexible proctosigmoidoscopy was performed.  The patient will need to be reprepped for a screening colonoscopy.  ENDOSCOPIST:  Danise Edge, M.D.  PREMEDICATIONS: 1. Fentanyl 100 mcg. 2. Versed 9.5 mg.  PROCEDURE:  Diagnostic esophagogastroduodenoscopy.  The patient was placed in the left lateral decubitus position.  The Pentax gastroscope was passed through the posterior hypopharynx into the proximal esophagus without difficulty.  The hypopharynx, larynx, and vocal cords appeared normal.  Esophagoscopy:  The proximal mid and lower segments of the esophageal mucosa appeared normal.  The squamocolumnar junction is noted at 42 cm from the incisor teeth.  There is no  endoscopic evidence for the presence of erosive esophagitis or Barrett's esophagus.  Gastroscopy:  Retroflex view of the gastric cardia and fundus was normal.  The gastric body, antrum, and pylorus appeared normal.  There is mild narrowing of the pyloric channel probably due to scarring.  Duodenoscopy:  Duodenal bulb and descending duodenum appear normal.  ASSESSMENT:  Normal esophagogastroduodenoscopy except for mild narrowing of the pyloric channel probably due to scarring from previous peptic ulcer disease.  PROCEDURE:  Flexible proctosigmoidoscopy to the splenic flexure.  Anal inspection and digital rectal exam were normal.  The Pentax pediatric colonoscope was introduced into the rectum and advanced to approximately the splenic flexure.  The colon was filled with dark liquid stool and the colonic mucosa could not be adequately screened for colon polyps. From the descending colon, 3 polyps sized 3 mm - 4 mm were removed.  Two polyps were removed with the cold biopsy forceps.  The 4 mm sized polyp was removed with cold snare, but lost in the dark liquid stool filling the colon and not retrieved for pathology.  ASSESSMENT:  Screening colonoscopy cancelled due to inadequate colon prep.  Three small polyps were removed from the descending colon.  RECOMMENDATIONS:  The patient will require repeat prepping for a screening colonoscopy.          ______________________________ Danise Edge, M.D.  MJ/MEDQ  D:  09/06/2010  T:  09/07/2010  Job:  [id number]  Electronically Signed by Danise Edge M.D. on 09/13/2010 04:47:53 PM

## 2010-09-20 ENCOUNTER — Other Ambulatory Visit: Payer: Self-pay | Admitting: Gastroenterology

## 2010-09-20 ENCOUNTER — Ambulatory Visit (HOSPITAL_COMMUNITY)
Admission: RE | Admit: 2010-09-20 | Discharge: 2010-09-20 | Disposition: A | Discharge status: Home / Self Care | Payer: Medicare PPO | Source: Ambulatory Visit | Attending: Gastroenterology | Admitting: Gastroenterology

## 2010-09-20 DIAGNOSIS — D126 Benign neoplasm of colon, unspecified: Secondary | ICD-10-CM | POA: Insufficient documentation

## 2010-09-28 ENCOUNTER — Other Ambulatory Visit: Payer: Self-pay | Admitting: Gastroenterology

## 2010-09-30 NOTE — Op Note (Signed)
  NAMEETHER, WOLTERS NO.:  [id number]  MEDICAL RECORD NO.:  [id number]  LOCATION:  [facility]                         FACILITY:  [facility]  PHYSICIAN:  Danise Edge, M.D.   DATE OF BIRTH:  [birth year]  DATE OF PROCEDURE:  09/20/2010 DATE OF DISCHARGE:                              OPERATIVE REPORT   HISTORY:  [name] is a 60 year old male, born on [birth year]. The patient underwent a flexible proctosigmoidoscopy with removal of small adenomatous polyps.  He is scheduled to undergo a surveillance colonoscopy.  ENDOSCOPIST:  Danise Edge, M.D.  PREMEDICATIONS: 1. Fentanyl 100 mcg. 2. Versed 10 mg.  PROCEDURE:  The patient was placed in the left lateral decubitus position.  Anal inspection and digital rectal exam were normal.  The Pentax pediatric colonoscope was introduced into the rectum and advanced to the cecum.  A normal-appearing ileocecal valve and appendiceal orifice were identified.  Colonic preparation for the exam today was good.  Rectum normal.  Retroflex view of the distal rectum normal.  Sigmoid colon and descending colon.  From the mid descending colon, a 3 mm sessile polyp was removed with cold biopsy forceps.  Splenic flexure normal.  Transverse colon normal.  Hepatic flexure normal.  Ascending colon normal.  Cecum and ileocecal valve were normal.  ASSESSMENT:  A small polyp was removed from the proximal descending colon with the cold biopsy forceps; otherwise, normal surveillance proctocolonoscopy to the cecum.  RECOMMENDATIONS:  Repeat surveillance colonoscopy in 5 years.          ______________________________ Danise Edge, M.D.     MJ/MEDQ  D:  09/20/2010  T:  09/20/2010  Job:  [id number]  Electronically Signed by Danise Edge M.D. on 09/30/2010 09:13:09 AM

## 2010-10-03 ENCOUNTER — Ambulatory Visit
Admission: RE | Admit: 2010-10-03 | Discharge: 2010-10-03 | Disposition: A | Discharge status: Home / Self Care | Payer: Medicare PPO | Source: Ambulatory Visit | Attending: Gastroenterology | Admitting: Gastroenterology

## 2010-10-03 MED ORDER — IOHEXOL 350 MG/ML SOLN
100.0000 mL | Freq: Once | INTRAVENOUS | Status: AC | PRN
  Administered 2010-10-03: 100 mL via INTRAVENOUS

## 2010-10-18 LAB — POCT I-STAT 4, (NA,K, GLUC, HGB,HCT)
Glucose, Bld: 113 — ABNORMAL HIGH
Glucose, Bld: 84
Glucose, Bld: 94
Glucose, Bld: 97
HCT: 25 — ABNORMAL LOW
HCT: 25 — ABNORMAL LOW
HCT: 36 — ABNORMAL LOW
Hemoglobin: 11.6 — ABNORMAL LOW
Hemoglobin: 8.5 — ABNORMAL LOW
Hemoglobin: 8.5 — ABNORMAL LOW
Hemoglobin: 9.9 — ABNORMAL LOW
Operator id: 3342
Operator id: 3342
Operator id: 3342
Potassium: 3.5
Potassium: 3.9
Sodium: 132 — ABNORMAL LOW
Sodium: 136

## 2010-10-18 LAB — CBC
HCT: 23.8 — ABNORMAL LOW
HCT: 25.1 — ABNORMAL LOW
HCT: 25.7 — ABNORMAL LOW
HCT: 27.7 — ABNORMAL LOW
HCT: 39.9
Hemoglobin: 13.9
Hemoglobin: 8.4 — ABNORMAL LOW
Hemoglobin: 8.6 — ABNORMAL LOW
MCHC: 34
MCHC: 34.1
MCHC: 34.1
MCHC: 34.2
MCHC: 34.8
MCHC: 34.9
MCHC: 35.4
MCV: 93.3
MCV: 93.4
MCV: 93.5
MCV: 94.5
MCV: 95.1
MCV: 95.3
Platelets: 101 — ABNORMAL LOW
Platelets: 107 — ABNORMAL LOW
Platelets: 116 — ABNORMAL LOW
Platelets: 118 — ABNORMAL LOW
Platelets: 125 — ABNORMAL LOW
Platelets: 167
Platelets: 192
RBC: 2.55 — ABNORMAL LOW
RBC: 2.63 — ABNORMAL LOW
RBC: 3.07 — ABNORMAL LOW
RBC: 3.16 — ABNORMAL LOW
RBC: 4.11 — ABNORMAL LOW
RDW: 13.6
RDW: 13.7
RDW: 14
RDW: 14
RDW: 14.1
RDW: 14.1
RDW: 14.2
RDW: 14.3
WBC: 10.5
WBC: 10.6 — ABNORMAL HIGH
WBC: 11.1 — ABNORMAL HIGH
WBC: 8.3

## 2010-10-18 LAB — POCT I-STAT, CHEM 8
Calcium, Ion: 1.16
Creatinine, Ser: 0.9
Creatinine, Ser: 1
Glucose, Bld: 126 — ABNORMAL HIGH
Glucose, Bld: 99
HCT: 27 — ABNORMAL LOW
Hemoglobin: 9.2 — ABNORMAL LOW
Hemoglobin: 9.5 — ABNORMAL LOW
Sodium: 136
TCO2: 20
TCO2: 22

## 2010-10-18 LAB — POCT I-STAT 3, ART BLOOD GAS (G3+)
Acid-Base Excess: 1
Acid-base deficit: 1
Acid-base deficit: 2
Bicarbonate: 24.2 — ABNORMAL HIGH
Bicarbonate: 25.5 — ABNORMAL HIGH
O2 Saturation: 100
O2 Saturation: 94
Operator id: 148841
Operator id: 257021
Patient temperature: 35.3
TCO2: 27
pCO2 arterial: 45.4 — ABNORMAL HIGH
pH, Arterial: 7.383
pH, Arterial: 7.385
pO2, Arterial: 374 — ABNORMAL HIGH
pO2, Arterial: 67 — ABNORMAL LOW
pO2, Arterial: 80
pO2, Arterial: 81

## 2010-10-18 LAB — BASIC METABOLIC PANEL
BUN: 10
BUN: 10
BUN: 10
BUN: 10
BUN: 8
BUN: 8
BUN: 9
BUN: 9
CO2: 24
CO2: 25
CO2: 25
CO2: 28
Calcium: 7.8 — ABNORMAL LOW
Calcium: 8 — ABNORMAL LOW
Calcium: 9.1
Chloride: 102
Chloride: 96
Chloride: 98
Chloride: 98
Chloride: 99
Creatinine, Ser: 0.79
Creatinine, Ser: 0.91
Creatinine, Ser: 0.93
Creatinine, Ser: 0.98
Creatinine, Ser: 1.03
GFR calc Af Amer: >60
GFR calc Af Amer: >60
GFR calc non Af Amer: >60
GFR calc non Af Amer: >60
GFR calc non Af Amer: >60
GFR calc non Af Amer: >60
GFR calc non Af Amer: >60
Glucose, Bld: 105 — ABNORMAL HIGH
Glucose, Bld: 106 — ABNORMAL HIGH
Glucose, Bld: 125 — ABNORMAL HIGH
Glucose, Bld: 130 — ABNORMAL HIGH
Glucose, Bld: 88
Glucose, Bld: 91
Glucose, Bld: 98
Potassium: 3.3 — ABNORMAL LOW
Potassium: 3.6
Potassium: 3.7
Potassium: 3.7
Potassium: 3.9
Potassium: 4
Sodium: 132 — ABNORMAL LOW
Sodium: 133 — ABNORMAL LOW

## 2010-10-18 LAB — LIPID PANEL
Cholesterol: 234 — ABNORMAL HIGH
Cholesterol: 251 — ABNORMAL HIGH
HDL: 26 — ABNORMAL LOW
LDL Cholesterol: 183 — ABNORMAL HIGH
LDL Cholesterol: 193 — ABNORMAL HIGH
Total CHOL/HDL Ratio: 10.5
Triglycerides: 124

## 2010-10-18 LAB — BLOOD GAS, ARTERIAL
Acid-Base Excess: 3.5 — ABNORMAL HIGH
Bicarbonate: 27.4 — ABNORMAL HIGH
O2 Saturation: 95.1
pO2, Arterial: 71.6 — ABNORMAL LOW

## 2010-10-18 LAB — CK TOTAL AND CKMB (NOT AT ARMC)
Relative Index: INVALID
Total CK: 94

## 2010-10-18 LAB — PROTIME-INR
INR: 0.9
INR: 1.2
Prothrombin Time: 12.7
Prothrombin Time: 16 — ABNORMAL HIGH

## 2010-10-18 LAB — COMPREHENSIVE METABOLIC PANEL
ALT: 10
Alkaline Phosphatase: 63
BUN: 10
CO2: 28
Calcium: 9
GFR calc non Af Amer: >60
Glucose, Bld: 96
Potassium: 3.2 — ABNORMAL LOW
Sodium: 136

## 2010-10-18 LAB — HEMOGLOBIN AND HEMATOCRIT, BLOOD
HCT: 25 — ABNORMAL LOW
Hemoglobin: 8.7 — ABNORMAL LOW

## 2010-10-18 LAB — CREATININE, SERUM
Creatinine, Ser: 0.85
Creatinine, Ser: 1.04
GFR calc Af Amer: >60
GFR calc Af Amer: >60
GFR calc non Af Amer: >60
GFR calc non Af Amer: >60

## 2010-10-18 LAB — HEPARIN LEVEL (UNFRACTIONATED): Heparin Unfractionated: 0.26 — ABNORMAL LOW

## 2010-10-18 LAB — DIFFERENTIAL
Basophils Relative: 1
Eosinophils Absolute: 0.2
Neutro Abs: 4.4
Neutrophils Relative %: 54

## 2010-10-18 LAB — POCT I-STAT 3, VENOUS BLOOD GAS (G3P V)
Acid-base deficit: 1
O2 Saturation: 83
pCO2, Ven: 47.4

## 2010-10-18 LAB — TROPONIN I: Troponin I: 0.27 — ABNORMAL HIGH

## 2010-10-18 LAB — CARDIAC PANEL(CRET KIN+CKTOT+MB+TROPI)
CK, MB: 1.7
Relative Index: INVALID
Troponin I: 0.19 — ABNORMAL HIGH

## 2010-10-18 LAB — TYPE AND SCREEN
ABO/RH(D): A NEG
Antibody Screen: NEGATIVE

## 2010-10-18 LAB — MAGNESIUM: Magnesium: 2.3

## 2010-10-18 LAB — APTT: aPTT: 34

## 2010-10-19 LAB — CBC
HCT: 26.4 — ABNORMAL LOW
HCT: 28.2 — ABNORMAL LOW
HCT: 28.8 — ABNORMAL LOW
Hemoglobin: 10.2 — ABNORMAL LOW
Hemoglobin: 10.6 — ABNORMAL LOW
Hemoglobin: 9.2 — ABNORMAL LOW
Hemoglobin: 9.3 — ABNORMAL LOW
Hemoglobin: 9.8 — ABNORMAL LOW
MCHC: 33.5
MCHC: 34.4
MCHC: 35.1
MCHC: 35.2
MCV: 92.7
MCV: 93.2
MCV: 94.9
Platelets: 230
Platelets: 272
Platelets: 314
Platelets: 350
Platelets: 353
Platelets: 381
RBC: 3.11 — ABNORMAL LOW
RBC: 3.19 — ABNORMAL LOW
RDW: 14.4
RDW: 14.6
RDW: 15.3
RDW: 15.4
RDW: 15.6 — ABNORMAL HIGH
RDW: 15.8 — ABNORMAL HIGH
WBC: 14.6 — ABNORMAL HIGH
WBC: 8.9

## 2010-10-19 LAB — BASIC METABOLIC PANEL
BUN: 7
BUN: 7
BUN: 8
BUN: 9
CO2: 21
CO2: 23
CO2: 27
Calcium: 7.2 — ABNORMAL LOW
Calcium: 7.5 — ABNORMAL LOW
Calcium: 7.8 — ABNORMAL LOW
Calcium: 8.1 — ABNORMAL LOW
Calcium: 8.2 — ABNORMAL LOW
Creatinine, Ser: 0.97
Creatinine, Ser: 1.04
Creatinine, Ser: 1.04
GFR calc Af Amer: >60
GFR calc non Af Amer: >60
GFR calc non Af Amer: >60
GFR calc non Af Amer: >60
Glucose, Bld: 103 — ABNORMAL HIGH
Glucose, Bld: 110 — ABNORMAL HIGH
Glucose, Bld: 98
Glucose, Bld: 99
Potassium: 4.1
Potassium: 4.3
Sodium: 131 — ABNORMAL LOW
Sodium: 135

## 2010-10-19 LAB — POCT I-STAT 3, ART BLOOD GAS (G3+)
Bicarbonate: 23.8
O2 Saturation: 100
Operator id: 274071
Patient temperature: 98.1
TCO2: 25
pCO2 arterial: 22.4 — ABNORMAL LOW
pCO2 arterial: 40.2
pH, Arterial: 7.379
pO2, Arterial: 75 — ABNORMAL LOW

## 2010-10-19 LAB — POCT I-STAT 7, (LYTES, BLD GAS, ICA,H+H)
Acid-base deficit: 3 — ABNORMAL HIGH
Calcium, Ion: 1.03 — ABNORMAL LOW
Hemoglobin: 5.8 — CL
Hemoglobin: 6.8 — CL
O2 Saturation: 100
O2 Saturation: 99
Patient temperature: 56.7
Potassium: 4.2
Potassium: 4.4
Sodium: 136
Sodium: 138
TCO2: 14
TCO2: 21
TCO2: 25
pCO2 arterial: 15.4 — CL
pH, Arterial: 7.158 — CL
pH, Arterial: 7.298 — ABNORMAL LOW
pO2, Arterial: 196 — ABNORMAL HIGH

## 2010-10-19 LAB — BODY FLUID CULTURE

## 2010-10-19 LAB — POCT I-STAT, CHEM 8
Creatinine, Ser: 1.3
Glucose, Bld: 103 — ABNORMAL HIGH
HCT: 36 — ABNORMAL LOW
Hemoglobin: 12.2 — ABNORMAL LOW
TCO2: 18

## 2010-10-19 LAB — CROSSMATCH

## 2010-10-19 LAB — POCT I-STAT 4, (NA,K, GLUC, HGB,HCT)
Glucose, Bld: 136 — ABNORMAL HIGH
HCT: 27 — ABNORMAL LOW
Operator id: 302581
Sodium: 136

## 2010-10-19 LAB — CULTURE, BLOOD (ROUTINE X 2): Culture: NO GROWTH

## 2010-10-19 LAB — DIFFERENTIAL
Basophils Absolute: 0
Basophils Relative: 1
Monocytes Absolute: 1
Neutro Abs: 5.1
Neutrophils Relative %: 61

## 2010-10-19 LAB — ANAEROBIC CULTURE

## 2010-10-19 LAB — CREATININE, SERUM
Creatinine, Ser: 1.22
GFR calc Af Amer: >60

## 2010-10-25 LAB — PROTIME-INR: INR: 1.1 (ref 0.00–1.49)

## 2010-10-25 LAB — COMPREHENSIVE METABOLIC PANEL
BUN: 12 mg/dL (ref 6–23)
CO2: 27 mEq/L (ref 19–32)
Chloride: 106 mEq/L (ref 96–112)
Creatinine, Ser: 0.89 mg/dL (ref 0.4–1.5)
GFR calc non Af Amer: >60 mL/min (ref >60)
Glucose, Bld: 93 mg/dL (ref 70–99)
Total Bilirubin: 0.4 mg/dL (ref 0.3–1.2)

## 2010-10-25 LAB — CBC
HCT: 36.6 % — ABNORMAL LOW (ref 39.0–52.0)
MCV: 93.6 fL (ref 78.0–100.0)
RBC: 3.91 MIL/uL — ABNORMAL LOW (ref 4.22–5.81)
WBC: 7.3 10*3/uL (ref 4.0–10.5)

## 2010-10-25 LAB — APTT: aPTT: 36 seconds (ref 24–37)

## 2010-11-01 LAB — DIFFERENTIAL
Eosinophils Absolute: 0.7
Lymphocytes Relative: 25
Lymphs Abs: 2.1
Monocytes Relative: 9
Neutrophils Relative %: 57

## 2010-11-01 LAB — COMPREHENSIVE METABOLIC PANEL
ALT: 12
AST: 16
Calcium: 9.6
Creatinine, Ser: 1.04
GFR calc Af Amer: >60
Glucose, Bld: 104 — ABNORMAL HIGH
Sodium: 132 — ABNORMAL LOW
Total Protein: 7.4

## 2010-11-01 LAB — BASIC METABOLIC PANEL
CO2: 32
Chloride: 89 — ABNORMAL LOW
Creatinine, Ser: 1.2
GFR calc Af Amer: >60
Potassium: 3.1 — ABNORMAL LOW

## 2010-11-01 LAB — CBC
MCHC: 34
RDW: 15.7 — ABNORMAL HIGH

## 2010-11-02 LAB — CBC
HCT: 33.7 — ABNORMAL LOW
HCT: 34.8 — ABNORMAL LOW
HCT: 37.1 — ABNORMAL LOW
Hemoglobin: 11.6 — ABNORMAL LOW
Hemoglobin: 11.9 — ABNORMAL LOW
Hemoglobin: 12.7 — ABNORMAL LOW
MCHC: 33.8
MCHC: 34.4
MCHC: 34.2
MCHC: 34.3
MCHC: 34.6
MCV: 89.9
MCV: 90.6
MCV: 89.8
MCV: 89.9
MCV: 90.1
Platelets: 253
Platelets: 168
Platelets: 198
Platelets: 225
RBC: 3.68 — ABNORMAL LOW
RBC: 3.75 — ABNORMAL LOW
RBC: 3.86 — ABNORMAL LOW
RBC: 4.14 — ABNORMAL LOW
RBC: 4.49
RDW: 15.6 — ABNORMAL HIGH
RDW: 15.6 — ABNORMAL HIGH
RDW: 15.6 — ABNORMAL HIGH
WBC: 8.4
WBC: 11 — ABNORMAL HIGH
WBC: 11.8 — ABNORMAL HIGH
WBC: 9.8

## 2010-11-02 LAB — BASIC METABOLIC PANEL
BUN: 9
BUN: 8
CO2: 29
CO2: 31
Calcium: 9.4
Calcium: 9.2
Chloride: 92 — ABNORMAL LOW
Chloride: 99
Chloride: 99
Creatinine, Ser: 1.04
Creatinine, Ser: 0.75
GFR calc Af Amer: >60
GFR calc Af Amer: >60
Glucose, Bld: 98
Potassium: 3.4 — ABNORMAL LOW
Sodium: 136

## 2010-11-02 LAB — CULTURE, BLOOD (ROUTINE X 2): Culture: NO GROWTH

## 2010-11-02 LAB — TROPONIN I: Troponin I: 0.02

## 2010-11-02 LAB — PROTIME-INR
INR: 0.9
Prothrombin Time: 12.8

## 2010-11-02 LAB — POCT CARDIAC MARKERS
Myoglobin, poc: 137
Operator id: 4761
Operator id: 4761
Troponin i, poc: <0.05

## 2010-11-02 LAB — DIFFERENTIAL
Basophils Relative: 1
Eosinophils Absolute: 0.1
Lymphs Abs: 2.4
Neutro Abs: 8.5 — ABNORMAL HIGH
Neutrophils Relative %: 72

## 2010-11-02 LAB — CK TOTAL AND CKMB (NOT AT ARMC)
CK, MB: 2.1
Relative Index: 2.1
Total CK: 102

## 2010-11-02 LAB — URINALYSIS, ROUTINE W REFLEX MICROSCOPIC
Ketones, ur: NEGATIVE
Protein, ur: NEGATIVE
Urobilinogen, UA: 1

## 2010-11-02 LAB — CARDIAC PANEL(CRET KIN+CKTOT+MB+TROPI)
CK, MB: 3
CK, MB: 3.3
Relative Index: 1.9
Relative Index: 1.9
Total CK: 160
Total CK: 171
Troponin I: 0.02
Troponin I: 0.03

## 2010-11-02 LAB — ABO/RH: ABO/RH(D): A NEG

## 2010-11-02 LAB — TYPE AND SCREEN: ABO/RH(D): A NEG

## 2010-11-02 LAB — SEDIMENTATION RATE: Sed Rate: 92 — ABNORMAL HIGH

## 2010-11-05 DIAGNOSIS — G8929 Other chronic pain: Secondary | ICD-10-CM

## 2010-11-05 DIAGNOSIS — I7100 Dissection of unspecified site of aorta: Secondary | ICD-10-CM

## 2010-11-05 DIAGNOSIS — I2510 Atherosclerotic heart disease of native coronary artery without angina pectoris: Secondary | ICD-10-CM

## 2010-11-05 DIAGNOSIS — K222 Esophageal obstruction: Secondary | ICD-10-CM

## 2010-11-05 DIAGNOSIS — I314 Cardiac tamponade: Secondary | ICD-10-CM | POA: Insufficient documentation

## 2010-11-05 DIAGNOSIS — I10 Essential (primary) hypertension: Secondary | ICD-10-CM

## 2010-11-05 DIAGNOSIS — E7800 Pure hypercholesterolemia, unspecified: Secondary | ICD-10-CM

## 2010-11-05 DIAGNOSIS — M549 Dorsalgia, unspecified: Secondary | ICD-10-CM | POA: Insufficient documentation

## 2010-11-06 ENCOUNTER — Encounter: Payer: Medicare PPO | Admitting: Thoracic Surgery (Cardiothoracic Vascular Surgery)

## 2011-11-27 ENCOUNTER — Encounter (HOSPITAL_COMMUNITY): Payer: Self-pay | Admitting: Emergency Medicine

## 2011-11-27 ENCOUNTER — Emergency Department (HOSPITAL_COMMUNITY)
Admission: EM | Admit: 2011-11-27 | Discharge: 2011-11-27 | Disposition: A | Discharge status: Home / Self Care | Payer: Medicare PPO | Attending: Emergency Medicine | Admitting: Emergency Medicine

## 2011-11-27 DIAGNOSIS — R21 Rash and other nonspecific skin eruption: Secondary | ICD-10-CM | POA: Insufficient documentation

## 2011-11-27 DIAGNOSIS — I10 Essential (primary) hypertension: Secondary | ICD-10-CM | POA: Insufficient documentation

## 2011-11-27 DIAGNOSIS — I2510 Atherosclerotic heart disease of native coronary artery without angina pectoris: Secondary | ICD-10-CM | POA: Insufficient documentation

## 2011-11-27 DIAGNOSIS — E7800 Pure hypercholesterolemia, unspecified: Secondary | ICD-10-CM | POA: Insufficient documentation

## 2011-11-27 DIAGNOSIS — Z8679 Personal history of other diseases of the circulatory system: Secondary | ICD-10-CM | POA: Insufficient documentation

## 2011-11-27 DIAGNOSIS — Q391 Atresia of esophagus with tracheo-esophageal fistula: Secondary | ICD-10-CM | POA: Insufficient documentation

## 2011-11-27 DIAGNOSIS — Z8719 Personal history of other diseases of the digestive system: Secondary | ICD-10-CM | POA: Insufficient documentation

## 2011-11-27 DIAGNOSIS — F17200 Nicotine dependence, unspecified, uncomplicated: Secondary | ICD-10-CM | POA: Insufficient documentation

## 2011-11-27 DIAGNOSIS — Z79899 Other long term (current) drug therapy: Secondary | ICD-10-CM | POA: Insufficient documentation

## 2011-11-27 DIAGNOSIS — R42 Dizziness and giddiness: Secondary | ICD-10-CM | POA: Insufficient documentation

## 2011-11-27 MED ORDER — PREDNISONE 50 MG PO TABS: ORAL_TABLET | ORAL | Status: DC

## 2011-11-27 MED ORDER — PREDNISONE 50 MG PO TABS
60.0000 mg | ORAL_TABLET | Freq: Once | ORAL | Status: AC
  Administered 2011-11-27: 60 mg via ORAL
  Filled 2011-11-27: qty 1

## 2011-11-27 NOTE — ED Notes (Signed)
Patient denied any dizziness upon standing.

## 2011-11-27 NOTE — ED Provider Notes (Signed)
History   This chart was scribed for Joya Gaskins, MD by Gerlean Ren. This patient was seen in room APA06/APA06 and the patient's care was started at 10:22 AM .   CSN: [id number]  Arrival date & time 11/27/11  0931   First MD Initiated Contact with Patient 11/27/11 1007      Chief Complaint  Patient presents with  . Rash  . Dizziness    The history is provided by the patient. No language interpreter was used.   ABRAN GAVIGAN is a 61 y.o. male who presents to the Emergency Department complaining of a mildly itchy, non-painful rash over bilateral hands and forearms onset last week with no known bites or exposures as cause. Pt denies fevers, emesis, abdominal pain, chest pain, and dyspnea as associated.  Pt denies known sick contacts with rash. Pt also reports that when he is in a moving car he experiences sudden dizziness/vertigo that he reports is similar to h/o vertigo.  Pt reports he can ambulate normally and that this dizziness is only present when in a moving car.  Pt has h/o chronic angina and chronic aortic dissection with no surgical intervention.  Pt is a current someday smoker but denies alcohol use.   Denies visual change/loss.  No focal weakness.  No HA.  No new meds.  No syncope.   PCP is Dr. Josefa Half.  Past Medical History  Diagnosis Date  . Cardiac tamponade   . Aortic dissection     TYPE 3  . HTN (hypertension)   . CAD (coronary artery disease)   . Hypercholesterolemia   . Chronic back pain   . Hiatal hernia   . Schatzki's ring     Past Surgical History  Procedure Date  . Sternal wire removeal 12/31/2007    [facility]  . Cabg x 6 07/31/2007    [facility]    Family History  Problem Relation Age of Onset  . Heart disease Mother   . Heart disease Father     History  Substance Use Topics  . Smoking status: Current Some Day Smoker -- 0.5 packs/day    Types: Cigarettes  . Smokeless tobacco: Never Used  . Alcohol Use: No      Review of Systems    Skin: Positive for rash.  Neurological: Positive for dizziness.  All other systems reviewed and are negative.    Allergies  Review of patient's allergies indicates no known allergies.  Home Medications   Current Outpatient Rx  Name  Route  Sig  Dispense  Refill  . FENTANYL 50 MCG/HR TD PT72   Transdermal   Place 1 patch onto the skin every 3 (three) days.          . ISOSORBIDE MONONITRATE ER 60 MG PO TB24   Oral   Take 60 mg by mouth 2 (two) times daily.         Marland Kitchen LISINOPRIL 40 MG PO TABS   Oral   Take 40 mg by mouth 2 (two) times daily.         Marland Kitchen METOPROLOL TARTRATE 25 MG PO TABS   Oral   Take 25 mg by mouth 2 (two) times daily.           Marland Kitchen FISH OIL 1000 MG PO CAPS   Oral   Take 1 capsule by mouth daily.         . OXYCODONE-ACETAMINOPHEN 10-325 MG PO TABS   Oral   Take 1 tablet by mouth  every 6 (six) hours as needed.          Marland Kitchen PANTOPRAZOLE SODIUM 40 MG PO TBEC   Oral   Take 40 mg by mouth 2 (two) times daily.          . SERTRALINE HCL 50 MG PO TABS   Oral   Take 50 mg by mouth 2 (two) times daily.           BP 129/77  Pulse 54  Temp 98.3 F (36.8 C) (Oral)  Resp 18  Ht 5\' 10"  (1.778 m)  Wt 150 lb (68.04 kg)  BMI 21.52 kg/m2  SpO2 98%  Physical Exam CONSTITUTIONAL: Well developed/well nourished HEAD AND FACE: Normocephalic/atraumatic EYES: EOMI/PERRL, no lesions surrounding orbit ENMT: Mucous membranes moist, no oral lesions noted NECK: supple no meningeal signs SPINE:entire spine nontender CV: S1/S2 noted, no murmurs/rubs/gallops noted LUNGS: Lungs are clear to auscultation bilaterally, no apparent distress ABDOMEN: soft, nontender, no rebound or guarding GU:no cva tenderness NEURO: Awake/alert, facies symmetric, no arm or leg drift is noted Cranial nerves 3/4/5/6/07/29/08/11/12 tested and intact Gait normal No pastpointing EXTREMITIES: pulses normal, full ROM SKIN: warm, color normal, rash noted to bilateral hands and  forearms that spares bilateral palms. PSYCH: no abnormalities of mood noted  ED Course  Procedures  DIAGNOSTIC STUDIES: Oxygen Saturation is 98% on room air, normal by my interpretation.    COORDINATION OF CARE: 10:29 AM-Patient informed of clinical course, understands medical decision-making process, and agrees with plan.  Ordered PO deltasone here and at discharge.  For rash - has dermatitis appearance, spares palms.  Does not appear infectious.  No skin sloughing.  Will advise short course of steroids  For his dizziness - he only gets it when he drives and not everytime he drives.  He denies focal weakness/HA/visual change.  He is now asymptomatic.  He reports similar to previous vertigo - no visual/hearing changes.  I advised against driving as this could cause an accident.  Pt will f/u with PCP   Pt reports chronic aortic dissection, but no new cp/sob/back pain.  No focal weakness.  I doubt this is an acute neurologic/vascular event    ekg changed from last ekg from 2009 doubt acute ischemic event.  Pt is very well appearing  MDM  Nursing notes including past medical history and social history reviewed and considered in documentation     Date: 11/27/2011  Rate: 51  Rhythm: sinus bradycardia  QRS Axis: normal  Intervals: normal  ST/T Wave abnormalities: nonspecific ST changes  Conduction Disutrbances:none  Narrative Interpretation:   Old EKG Reviewed: changes noted and inverted T waves noted changed from prior Old ekg from 2009   I personally performed the services described in this documentation, which was scribed in my presence. The recorded information has been reviewed and considered.           Joya Gaskins, MD 11/27/11 1126

## 2011-11-27 NOTE — ED Notes (Signed)
Pt states rash to both hands for over one week. Also states feels disoriented especially when driving. Pt staes has hadvertigo beforebut doesn't feel dizzy just swimmy headed

## 2011-12-23 ENCOUNTER — Other Ambulatory Visit: Payer: Self-pay | Admitting: Dermatology

## 2012-01-07 ENCOUNTER — Emergency Department (HOSPITAL_COMMUNITY): Payer: Medicare PPO

## 2012-01-07 ENCOUNTER — Emergency Department (HOSPITAL_COMMUNITY)
Admission: EM | Admit: 2012-01-07 | Discharge: 2012-01-08 | Discharge status: Against Medical Advice | Payer: Medicare PPO | Attending: Emergency Medicine | Admitting: Emergency Medicine

## 2012-01-07 ENCOUNTER — Encounter (HOSPITAL_COMMUNITY): Payer: Self-pay | Admitting: *Deleted

## 2012-01-07 DIAGNOSIS — Z8719 Personal history of other diseases of the digestive system: Secondary | ICD-10-CM | POA: Insufficient documentation

## 2012-01-07 DIAGNOSIS — Q393 Congenital stenosis and stricture of esophagus: Secondary | ICD-10-CM | POA: Insufficient documentation

## 2012-01-07 DIAGNOSIS — M549 Dorsalgia, unspecified: Secondary | ICD-10-CM | POA: Insufficient documentation

## 2012-01-07 DIAGNOSIS — R079 Chest pain, unspecified: Secondary | ICD-10-CM | POA: Insufficient documentation

## 2012-01-07 DIAGNOSIS — I2510 Atherosclerotic heart disease of native coronary artery without angina pectoris: Secondary | ICD-10-CM | POA: Insufficient documentation

## 2012-01-07 DIAGNOSIS — E7800 Pure hypercholesterolemia, unspecified: Secondary | ICD-10-CM | POA: Insufficient documentation

## 2012-01-07 DIAGNOSIS — Z9889 Other specified postprocedural states: Secondary | ICD-10-CM | POA: Insufficient documentation

## 2012-01-07 DIAGNOSIS — G8929 Other chronic pain: Secondary | ICD-10-CM | POA: Insufficient documentation

## 2012-01-07 DIAGNOSIS — Z8679 Personal history of other diseases of the circulatory system: Secondary | ICD-10-CM | POA: Insufficient documentation

## 2012-01-07 DIAGNOSIS — Q391 Atresia of esophagus with tracheo-esophageal fistula: Secondary | ICD-10-CM | POA: Insufficient documentation

## 2012-01-07 DIAGNOSIS — I10 Essential (primary) hypertension: Secondary | ICD-10-CM | POA: Insufficient documentation

## 2012-01-07 DIAGNOSIS — F17200 Nicotine dependence, unspecified, uncomplicated: Secondary | ICD-10-CM | POA: Insufficient documentation

## 2012-01-07 LAB — POCT I-STAT TROPONIN I: Troponin i, poc: 0 ng/mL (ref 0.00–0.08)

## 2012-01-07 LAB — BASIC METABOLIC PANEL
BUN: 17 mg/dL (ref 6–23)
Calcium: 9.9 mg/dL (ref 8.4–10.5)
GFR calc non Af Amer: >90 mL/min (ref >90)
Glucose, Bld: 98 mg/dL (ref 70–99)

## 2012-01-07 LAB — LIPASE, BLOOD: Lipase: 18 U/L (ref 11–59)

## 2012-01-07 LAB — CBC
HCT: 39.3 % (ref 39.0–52.0)
Hemoglobin: 13.5 g/dL (ref 13.0–17.0)
MCH: 33 pg (ref 26.0–34.0)
MCHC: 34.4 g/dL (ref 30.0–36.0)

## 2012-01-07 LAB — PRO B NATRIURETIC PEPTIDE: Pro B Natriuretic peptide (BNP): 540.5 pg/mL — ABNORMAL HIGH (ref 0–125)

## 2012-01-07 MED ORDER — ONDANSETRON HCL 4 MG/2ML IJ SOLN
4.0000 mg | Freq: Once | INTRAMUSCULAR | Status: AC
  Administered 2012-01-07: 4 mg via INTRAVENOUS
  Filled 2012-01-07: qty 2

## 2012-01-07 MED ORDER — MORPHINE SULFATE 4 MG/ML IJ SOLN
4.0000 mg | Freq: Once | INTRAMUSCULAR | Status: AC
  Administered 2012-01-07: 4 mg via INTRAVENOUS
  Filled 2012-01-07: qty 1

## 2012-01-07 MED ORDER — ASPIRIN 81 MG PO CHEW
324.0000 mg | CHEWABLE_TABLET | Freq: Once | ORAL | Status: AC
  Administered 2012-01-07: 324 mg via ORAL
  Filled 2012-01-07: qty 4

## 2012-01-07 MED ORDER — NITROGLYCERIN 0.4 MG SL SUBL
0.4000 mg | SUBLINGUAL_TABLET | SUBLINGUAL | Status: DC | PRN
  Filled 2012-01-07: qty 25

## 2012-01-07 NOTE — ED Provider Notes (Signed)
History     CSN: [id number]  Arrival date & time 01/07/12  1725   First MD Initiated Contact with Patient 01/07/12 2312      Chief Complaint  Patient presents with  . Chest Pain  . Back Pain    (Consider location/radiation/quality/duration/timing/severity/associated sxs/prior treatment) HPI Street provided by patient. Has significant cardiac history, CABG / MI/ aortic dissection. Has been having persistent chest pain and back pain for last 3 days. Pain is sharp in quality and radiates from substernal region to left mid back. He describes this as his typical angina pain is although the last 3 days unrelieved by anything and constant. Somewhat positional pain. No leg pain or leg swelling. No shortness of breath. No cough or fevers. No recent illness or known sick contacts. Currently having 8/10 pain.  He spoke with his cardiologist today who recommended he be evaluated in the emergency department. Past Medical History  Diagnosis Date  . Cardiac tamponade   . Aortic dissection     TYPE 3  . HTN (hypertension)   . CAD (coronary artery disease)   . Hypercholesterolemia   . Chronic back pain   . Hiatal hernia   . Schatzki's ring     Past Surgical History  Procedure Date  . Sternal wire removeal 12/31/2007    HENDRICKSON  . Cabg x 6 07/31/2007    HENDRICKSON  . Cardiac catheterization   . Back surgery   . Knee surgery   . Shoulder surgery     Family History  Problem Relation Age of Onset  . Heart disease Mother   . Heart disease Father     History  Substance Use Topics  . Smoking status: Current Some Day Smoker -- 0.5 packs/day    Types: Cigarettes  . Smokeless tobacco: Never Used  . Alcohol Use: No      Review of Systems  Constitutional: Negative for fever and chills.  HENT: Negative for neck pain and neck stiffness.   Eyes: Negative for pain.  Respiratory: Negative for shortness of breath.   Cardiovascular: Positive for chest pain.  Gastrointestinal:  Negative for abdominal pain.  Genitourinary: Negative for dysuria.  Musculoskeletal: Positive for back pain.  Skin: Negative for rash.  Neurological: Negative for headaches.  All other systems reviewed and are negative.    Allergies  Review of patient's allergies indicates no known allergies.  Home Medications   Current Outpatient Rx  Name  Route  Sig  Dispense  Refill  . FENTANYL 50 MCG/HR TD PT72   Transdermal   Place 1 patch onto the skin every 3 (three) days.          . ISOSORBIDE MONONITRATE ER 60 MG PO TB24   Oral   Take 60 mg by mouth 2 (two) times daily.         Marland Kitchen LISINOPRIL 40 MG PO TABS   Oral   Take 40 mg by mouth 2 (two) times daily.         Marland Kitchen METOPROLOL TARTRATE 25 MG PO TABS   Oral   Take 25 mg by mouth 2 (two) times daily.           Marland Kitchen FISH OIL 1000 MG PO CAPS   Oral   Take 1 capsule by mouth daily.         . OXYCODONE-ACETAMINOPHEN 10-325 MG PO TABS   Oral   Take 1 tablet by mouth every 6 (six) hours as needed.          Marland Kitchen  PANTOPRAZOLE SODIUM 40 MG PO TBEC   Oral   Take 40 mg by mouth 2 (two) times daily.          Marland Kitchen PREDNISONE 50 MG PO TABS      One tablet PO daily for 4 days   4 tablet   0   . SERTRALINE HCL 50 MG PO TABS   Oral   Take 50 mg by mouth 2 (two) times daily.           BP 134/92  Pulse 76  Temp 98.8 F (37.1 C)  Resp 20  Ht 5\' 10"  (1.778 m)  Wt 154 lb (69.854 kg)  BMI 22.10 kg/m2  SpO2 96%  Physical Exam  Constitutional: He is oriented to person, place, and time. He appears well-developed and well-nourished.  HENT:  Head: Normocephalic and atraumatic.  Eyes: Conjunctivae normal and EOM are normal. Pupils are equal, round, and reactive to light.  Neck: Trachea normal. Neck supple. No thyromegaly present.  Cardiovascular: Normal rate, regular rhythm, S1 normal, S2 normal and normal pulses.     No systolic murmur is present   No diastolic murmur is present  Pulses:      Radial pulses are 2+ on the  right side, and 2+ on the left side.  Pulmonary/Chest: Effort normal and breath sounds normal. He has no wheezes. He has no rhonchi. He has no rales. He exhibits no tenderness.  Abdominal: Soft. Normal appearance and bowel sounds are normal. There is no tenderness. There is no CVA tenderness and negative Murphy's sign.  Musculoskeletal:       BLE:s Calves nontender, no cords or erythema, negative Homans sign  Neurological: He is alert and oriented to person, place, and time. He has normal strength. No cranial nerve deficit or sensory deficit. GCS eye subscore is 4. GCS verbal subscore is 5. GCS motor subscore is 6.  Skin: Skin is warm and dry. No rash noted. He is not diaphoretic.  Psychiatric: His speech is normal.       Cooperative and appropriate    ED Course  Procedures (including critical care time)  Results for orders placed during the hospital encounter of 01/07/12  CBC      Component Value Range   WBC 9.4  4.0 - 10.5 K/uL   RBC 4.09 (*) 4.22 - 5.81 MIL/uL   Hemoglobin 13.5  13.0 - 17.0 g/dL   HCT 56.2  13.0 - 86.5 %   MCV 96.1  78.0 - 100.0 fL   MCH 33.0  26.0 - 34.0 pg   MCHC 34.4  30.0 - 36.0 g/dL   RDW 78.4  69.6 - 29.5 %   Platelets 148 (*) 150 - 400 K/uL  BASIC METABOLIC PANEL      Component Value Range   Sodium 139  135 - 145 mEq/L   Potassium 3.6  3.5 - 5.1 mEq/L   Chloride 97  96 - 112 mEq/L   CO2 30  19 - 32 mEq/L   Glucose, Bld 98  70 - 99 mg/dL   BUN 17  6 - 23 mg/dL   Creatinine, Ser 2.84  0.50 - 1.35 mg/dL   Calcium 9.9  8.4 - 13.2 mg/dL   GFR calc non Af Amer >90  >90 mL/min   GFR calc Af Amer >90  >90 mL/min  PRO B NATRIURETIC PEPTIDE      Component Value Range   Pro B Natriuretic peptide (BNP) 540.5 (*) 0 - 125 pg/mL  POCT I-STAT TROPONIN I      Component Value Range   Troponin i, poc 0.00  0.00 - 0.08 ng/mL   Comment 3           LIPASE, BLOOD      Component Value Range   Lipase 18  11 - 59 U/L   Dg Chest 2 View  01/07/2012  *RADIOLOGY  REPORT*  Clinical Data: Chest and back pain.  Shortness of breath.  Coronary artery disease.  CHEST - 2 VIEW  Comparison: 08/22/2009  Findings: Heart size is normal.  Both lungs are clear.  No evidence of pleural effusion.  No mass or lymphadenopathy identified.  Mild tortuosity thoracic aorta stable.  Prior CABG again noted. Pulmonary hyperinflation again seen, consistent with COPD.  IMPRESSION: COPD.  No active disease.   Original Report Authenticated By: Myles Rosenthal, M.D.    Ct Angio Chest W/cm &/or Wo Cm  01/08/2012  *RADIOLOGY REPORT*  Clinical Data:  SOB, chest pain, back pain, history of dissection  CT ANGIOGRAPHY CHEST, ABDOMEN AND PELVIS  Technique:  Multidetector CT imaging through the chest, abdomen and pelvis was performed using the standard protocol during bolus administration of intravenous contrast.  Multiplanar reconstructed images including MIPs were obtained and reviewed to evaluate the vascular anatomy.  Contrast: 80mL OMNIPAQUE IOHEXOL 350 MG/ML SOLN  Comparison:  [facility] CTA abdomen dated 10/03/2010 and [facility] chest dated 09/12/2009.  CTA CHEST  Findings:  No evidence of intramural hematoma.  Stable type B aortic dissection arising just distal to the origin of the left subclavian artery.  True and false lumen remain patent.  Aneurysmal dilatation of the descending thoracic aorta, measuring 3.9 cm at the level of the proximal descending aorta and 3.5 cm at the aortic hiatus.  Lungs are essentially clear.  No suspicious pulmonary nodules.  No pleural effusion or pneumothorax.  Visualized thyroid is unremarkable.  Heart is normal in size.  Coronary atherosclerosis. Postsurgical changes related to prior CABG.  No suspicious mediastinal, hilar, or axillary lymphadenopathy.  Mild degenerative changes of the thoracic spine.   Review of the MIP images confirms the above findings.  IMPRESSION: Stable type B aortic dissection arising just distal to the origin of the left subclavian artery.   Associated 3.9 cm proximal descending thoracic aortic aneurysm.  CTA ABDOMEN AND PELVIS  Findings:  Aortic dissection extends to the level of the left renal artery.  Dissection flap extends into the celiac artery (series 8/image 158), which remains patent.  SMA and right renal artery arise from the true lumen.  The dissection flap extends into the left renal artery (series 8/image 172), which remains patent and is predominantly arising from the false lumen.  Atherosclerotic changes of the infrarenal abdominal aorta with mural thrombus.  Liver, spleen, pancreas, and adrenal glands are within normal limits.  Gallbladder is unremarkable.  No intrahepatic or extrahepatic ductal dilatation.  Differential/decreased perfusion of the left kidney.  No hydronephrosis.  No evidence of bowel obstruction.  Normal appendix.  No abdominopelvic ascites.  No suspicious abdominopelvic lymphadenopathy.  Prostate is unremarkable.  Bladder is mildly thick-walled.  Degenerative changes of the lumbar spine with PLIF from L4-S1.   Review of the MIP images confirms the above findings.  IMPRESSION: Stable aortic dissection extending to the level of the left renal artery, as described above.   Original Report Authenticated By: Charline Bills, M.D.    Ct Cta Abd/pel W/cm &/or W/o Cm  01/08/2012  *RADIOLOGY REPORT*  Clinical Data:  SOB, chest pain, back pain, history of dissection  CT ANGIOGRAPHY CHEST, ABDOMEN AND PELVIS  Technique:  Multidetector CT imaging through the chest, abdomen and pelvis was performed using the standard protocol during bolus administration of intravenous contrast.  Multiplanar reconstructed images including MIPs were obtained and reviewed to evaluate the vascular anatomy.  Contrast: 80mL OMNIPAQUE IOHEXOL 350 MG/ML SOLN  Comparison:  [facility] CTA abdomen dated 10/03/2010 and [facility] chest dated 09/12/2009.  CTA CHEST  Findings:  No evidence of intramural hematoma.  Stable type B aortic dissection arising  just distal to the origin of the left subclavian artery.  True and false lumen remain patent.  Aneurysmal dilatation of the descending thoracic aorta, measuring 3.9 cm at the level of the proximal descending aorta and 3.5 cm at the aortic hiatus.  Lungs are essentially clear.  No suspicious pulmonary nodules.  No pleural effusion or pneumothorax.  Visualized thyroid is unremarkable.  Heart is normal in size.  Coronary atherosclerosis. Postsurgical changes related to prior CABG.  No suspicious mediastinal, hilar, or axillary lymphadenopathy.  Mild degenerative changes of the thoracic spine.   Review of the MIP images confirms the above findings.  IMPRESSION: Stable type B aortic dissection arising just distal to the origin of the left subclavian artery.  Associated 3.9 cm proximal descending thoracic aortic aneurysm.  CTA ABDOMEN AND PELVIS  Findings:  Aortic dissection extends to the level of the left renal artery.  Dissection flap extends into the celiac artery (series 8/image 158), which remains patent.  SMA and right renal artery arise from the true lumen.  The dissection flap extends into the left renal artery (series 8/image 172), which remains patent and is predominantly arising from the false lumen.  Atherosclerotic changes of the infrarenal abdominal aorta with mural thrombus.  Liver, spleen, pancreas, and adrenal glands are within normal limits.  Gallbladder is unremarkable.  No intrahepatic or extrahepatic ductal dilatation.  Differential/decreased perfusion of the left kidney.  No hydronephrosis.  No evidence of bowel obstruction.  Normal appendix.  No abdominopelvic ascites.  No suspicious abdominopelvic lymphadenopathy.  Prostate is unremarkable.  Bladder is mildly thick-walled.  Degenerative changes of the lumbar spine with PLIF from L4-S1.   Review of the MIP images confirms the above findings.  IMPRESSION: Stable aortic dissection extending to the level of the left renal artery, as described above.    Original Report Authenticated By: Charline Bills, M.D.       Date: 01/07/2012  Rate: 73   Rhythm: normal sinus rhythm  QRS Axis: normal  Intervals: normal  ST/T Wave abnormalities: nonspecific ST changes  Conduction Disutrbances:none  Narrative Interpretation:   Old EKG Reviewed: none available  11:38 PM CAR consult, d/w DR Mayford Knife Deboraha Sprang) she recs CT now and MED admit  IV morphine pain control  3:18 AM d/w MED, DR Toniann Fail to admit.          On recheck, pain resolved and patient insists that he be discharged from emergency department and wants to leave AMA. States understanding risks of death or loss of current lifestyle if he were to leave prior to complete workup. He is alert and oriented x 4. No indication for IVC. Patient adamant that he be discharged home. He states he has a followup appointment in the morning with his cardiologist. Patient is aware that he cannot drive after receiving IV antibiotics in the emergency department. His wife is bedside and states she is driving.  MDM   Chest pain with significant cardiac  history. Workup as above including EKG, labs and imaging. He has stable dissection. Troponin/ EKG unchanged. Cardiology consult, medicine consult for admission. Prior to admit patient chooses to leave AMA.        Sunnie Nielsen, MD 01/08/12 [phone]

## 2012-01-07 NOTE — ED Notes (Addendum)
Patient reports he has had angina and back pain for 1 week.  Patient reports he has sob, weakness, and reports he has had episodes of "going" out.  Patient noted to have unsteady gait when walking in.  Patient has been taking heart meds.  Patient did not take aspirin per his MD.  Patient states he has taken nitro with no relief.   He has had n/v and diaphoresis

## 2012-01-08 ENCOUNTER — Encounter (HOSPITAL_COMMUNITY): Payer: Self-pay | Admitting: Radiology

## 2012-01-08 ENCOUNTER — Emergency Department (HOSPITAL_COMMUNITY): Payer: Medicare PPO

## 2012-01-08 MED ORDER — IOHEXOL 350 MG/ML SOLN
80.0000 mL | Freq: Once | INTRAVENOUS | Status: AC | PRN
  Administered 2012-01-08: 80 mL via INTRAVENOUS

## 2012-01-08 MED ORDER — MORPHINE SULFATE 4 MG/ML IJ SOLN
4.0000 mg | INTRAMUSCULAR | Status: DC | PRN
  Administered 2012-01-08: 4 mg via INTRAVENOUS
  Filled 2012-01-08: qty 1

## 2012-01-08 NOTE — ED Notes (Signed)
Patient currently sitting up in bed; no respiratory or acute distress noted.  Patient updated on plan of care; informed patient that we are currently waiting on EDP to come and talk about CT results.  Patient denies any needs at this time; will continue to monitor.

## 2012-01-08 NOTE — ED Notes (Signed)
CT Abdomen and Chest completed; results back at 0210.

## 2012-01-08 NOTE — ED Notes (Signed)
Patient wanting to leave hospital AMA; refuses further treatment.  EDP aware and explained risks of leaving hospital AMA, including possible death.  Patient verbalized understanding of risks; patient signed electronic AMA form with RNs as witnesses.  Patient informed to return to the ED for new, worsening, or concerning symptoms.

## 2012-01-08 NOTE — ED Notes (Signed)
Patient currently sitting up in bed; no respiratory or acute distress noted.  Patient updated on plan of care; informed patient that we are currently waiting on CT to come and transport patient for CT Angio.  Patient denies any needs at this time.  Will continue to monitor.

## 2012-01-08 NOTE — ED Notes (Signed)
Patient currently in CT; will continue to monitor.

## 2012-03-10 ENCOUNTER — Encounter (HOSPITAL_COMMUNITY): Payer: Self-pay | Admitting: Emergency Medicine

## 2012-03-10 ENCOUNTER — Emergency Department (HOSPITAL_COMMUNITY): Payer: Medicare PPO

## 2012-03-10 ENCOUNTER — Emergency Department (HOSPITAL_COMMUNITY)
Admission: EM | Admit: 2012-03-10 | Discharge: 2012-03-10 | Discharge status: Against Medical Advice | Payer: Medicare PPO | Attending: Emergency Medicine | Admitting: Emergency Medicine

## 2012-03-10 DIAGNOSIS — Z951 Presence of aortocoronary bypass graft: Secondary | ICD-10-CM | POA: Insufficient documentation

## 2012-03-10 DIAGNOSIS — Z8679 Personal history of other diseases of the circulatory system: Secondary | ICD-10-CM | POA: Insufficient documentation

## 2012-03-10 DIAGNOSIS — I7101 Dissection of thoracic aorta: Secondary | ICD-10-CM

## 2012-03-10 DIAGNOSIS — F17200 Nicotine dependence, unspecified, uncomplicated: Secondary | ICD-10-CM | POA: Insufficient documentation

## 2012-03-10 DIAGNOSIS — R1013 Epigastric pain: Secondary | ICD-10-CM | POA: Insufficient documentation

## 2012-03-10 DIAGNOSIS — R079 Chest pain, unspecified: Secondary | ICD-10-CM

## 2012-03-10 DIAGNOSIS — I10 Essential (primary) hypertension: Secondary | ICD-10-CM | POA: Insufficient documentation

## 2012-03-10 DIAGNOSIS — Z7982 Long term (current) use of aspirin: Secondary | ICD-10-CM | POA: Insufficient documentation

## 2012-03-10 DIAGNOSIS — R05 Cough: Secondary | ICD-10-CM | POA: Insufficient documentation

## 2012-03-10 DIAGNOSIS — R0602 Shortness of breath: Secondary | ICD-10-CM | POA: Insufficient documentation

## 2012-03-10 DIAGNOSIS — Z79899 Other long term (current) drug therapy: Secondary | ICD-10-CM | POA: Insufficient documentation

## 2012-03-10 DIAGNOSIS — Z8719 Personal history of other diseases of the digestive system: Secondary | ICD-10-CM | POA: Insufficient documentation

## 2012-03-10 DIAGNOSIS — R110 Nausea: Secondary | ICD-10-CM | POA: Insufficient documentation

## 2012-03-10 DIAGNOSIS — R059 Cough, unspecified: Secondary | ICD-10-CM | POA: Insufficient documentation

## 2012-03-10 DIAGNOSIS — I2510 Atherosclerotic heart disease of native coronary artery without angina pectoris: Secondary | ICD-10-CM | POA: Insufficient documentation

## 2012-03-10 DIAGNOSIS — I71019 Dissection of thoracic aorta, unspecified: Secondary | ICD-10-CM

## 2012-03-10 DIAGNOSIS — Z87738 Personal history of other specified (corrected) congenital malformations of digestive system: Secondary | ICD-10-CM | POA: Insufficient documentation

## 2012-03-10 DIAGNOSIS — E7800 Pure hypercholesterolemia, unspecified: Secondary | ICD-10-CM | POA: Insufficient documentation

## 2012-03-10 DIAGNOSIS — I7100 Dissection of unspecified site of aorta: Secondary | ICD-10-CM

## 2012-03-10 LAB — CBC
HCT: 38.2 % — ABNORMAL LOW (ref 39.0–52.0)
MCHC: 34.8 g/dL (ref 30.0–36.0)
Platelets: 163 10*3/uL (ref 150–400)
RDW: 12.6 % (ref 11.5–15.5)
WBC: 7.6 10*3/uL (ref 4.0–10.5)

## 2012-03-10 LAB — BASIC METABOLIC PANEL
BUN: 12 mg/dL (ref 6–23)
Chloride: 102 mEq/L (ref 96–112)
GFR calc Af Amer: >90 mL/min (ref >90)
GFR calc non Af Amer: >90 mL/min (ref >90)
Potassium: 3.7 mEq/L (ref 3.5–5.1)
Sodium: 140 mEq/L (ref 135–145)

## 2012-03-10 LAB — HEPATIC FUNCTION PANEL
ALT: 13 U/L (ref 0–53)
Albumin: 3.9 g/dL (ref 3.5–5.2)
Alkaline Phosphatase: 51 U/L (ref 39–117)
Total Protein: 6.8 g/dL (ref 6.0–8.3)

## 2012-03-10 LAB — TROPONIN I: Troponin I: <0.3 ng/mL (ref <0.30)

## 2012-03-10 MED ORDER — IOHEXOL 350 MG/ML SOLN
100.0000 mL | Freq: Once | INTRAVENOUS | Status: AC | PRN
  Administered 2012-03-10: 100 mL via INTRAVENOUS

## 2012-03-10 MED ORDER — GI COCKTAIL ~~LOC~~
30.0000 mL | Freq: Once | ORAL | Status: AC
  Administered 2012-03-10: 30 mL via ORAL
  Filled 2012-03-10: qty 30

## 2012-03-10 MED ORDER — NITROGLYCERIN 0.4 MG SL SUBL
0.4000 mg | SUBLINGUAL_TABLET | SUBLINGUAL | Status: DC | PRN
  Filled 2012-03-10: qty 25

## 2012-03-10 MED ORDER — FAMOTIDINE IN NACL 20-0.9 MG/50ML-% IV SOLN
20.0000 mg | Freq: Once | INTRAVENOUS | Status: AC
  Administered 2012-03-10: 20 mg via INTRAVENOUS
  Filled 2012-03-10: qty 50

## 2012-03-10 NOTE — ED Provider Notes (Signed)
History  This chart was scribed for Jim Quarry, MD, by Candelaria Stagers, ED Scribe. This patient was seen in room [facility]07/[facility]07 and the patient's care was started at 7:56 PM   CSN: [id number]  Arrival date & time 03/10/12  1857   First MD Initiated Contact with Patient 03/10/12 1933      Chief Complaint  Patient presents with  . Chest Pain    The history is provided by the patient. No language interpreter was used.   [name] is a 62 y.o. male who presents to the Emergency Department complaining of constant chest and epigastric pain that started about three days ago.  Pt has h/o angina, heart attack, and aortic dissection and states that this pain today is similar to previous pain.  He describes the pain as a sharp burning pain, 7/10 pain presently.  Pt states that his chest pain is typically relieved with medication, but has had no relief over the last three days.  He has applied a Fentanyl patch and taken 4 nitroglycerin today.  He has also experienced nausea, SOB, and productive cough.  Pt smokes.  He has h/o HTN, hypercholesterolemia, and chronic back pain.  He denies h/o blood clots.  He does not currently take blood thinners.         Past Medical History  Diagnosis Date  . Cardiac tamponade   . Aortic dissection     TYPE 3  . HTN (hypertension)   . CAD (coronary artery disease)   . Hypercholesterolemia   . Chronic back pain   . Hiatal hernia   . Schatzki's ring     Past Surgical History  Procedure Laterality Date  . Sternal wire removeal  12/31/2007    HENDRICKSON  . Cabg x 6  07/31/2007    HENDRICKSON  . Cardiac catheterization    . Back surgery    . Knee surgery    . Shoulder surgery      Family History  Problem Relation Age of Onset  . Heart disease Mother   . Heart disease Father     History  Substance Use Topics  . Smoking status: Current Some Day Smoker -- 0.50 packs/day    Types: Cigarettes  . Smokeless tobacco: Never Used  . Alcohol Use: No       Review of Systems  Respiratory: Positive for cough.   Cardiovascular: Positive for chest pain. Negative for leg swelling.  Gastrointestinal: Positive for nausea.  All other systems reviewed and are negative.    Allergies  Review of patient's allergies indicates no known allergies.  Home Medications   Current Outpatient Rx  Name  Route  Sig  Dispense  Refill  . aspirin 325 MG tablet   Oral   Take 325 mg by mouth daily.         Marland Kitchen ezetimibe (ZETIA) 10 MG tablet   Oral   Take 10 mg by mouth daily.         . fentaNYL (DURAGESIC - DOSED MCG/HR) 50 MCG/HR   Transdermal   Place 1 patch onto the skin every 3 (three) days.          . isosorbide mononitrate (IMDUR) 60 MG 24 hr tablet   Oral   Take 60 mg by mouth 2 (two) times daily.         Marland Kitchen lisinopril (PRINIVIL,ZESTRIL) 40 MG tablet   Oral   Take 20 mg by mouth 2 (two) times daily.          Marland Kitchen  metoprolol tartrate (LOPRESSOR) 25 MG tablet   Oral   Take 25 mg by mouth 2 (two) times daily.           . nitroGLYCERIN (NITROSTAT) 0.4 MG SL tablet   Sublingual   Place 0.4 mg under the tongue every 5 (five) minutes as needed. Chest pain         . Omega-3 Fatty Acids (FISH OIL) 1000 MG CAPS   Oral   Take 1 capsule by mouth daily.         Marland Kitchen oxyCODONE-acetaminophen (PERCOCET) 10-325 MG per tablet   Oral   Take 1 tablet by mouth every 6 (six) hours as needed.          . pantoprazole (PROTONIX) 40 MG tablet   Oral   Take 40 mg by mouth 2 (two) times daily.          . rosuvastatin (CRESTOR) 20 MG tablet   Oral   Take 20 mg by mouth daily.         . sertraline (ZOLOFT) 50 MG tablet   Oral   Take 50 mg by mouth 2 (two) times daily.           BP 151/100  Pulse 61  Temp(Src) 98.6 F (37 C) (Oral)  Resp 18  Ht 5\' 10"  (1.778 m)  Wt 150 lb (68.04 kg)  BMI 21.52 kg/m2  SpO2 97%  Physical Exam  Nursing note and vitals reviewed. Constitutional: He is oriented to person, place, and time. He  appears well-developed and well-nourished. No distress.  HENT:  Head: Normocephalic and atraumatic.  Eyes: Right eye exhibits no discharge. Left eye exhibits no discharge.  Neck: Normal range of motion. Neck supple.  Cardiovascular: Normal rate and regular rhythm.   No murmur heard. Pulmonary/Chest:  Fentanyl patch to right chest.  Midline chest scar.    Abdominal: Soft. There is no tenderness.  Musculoskeletal: He exhibits no edema.  Good distal pulses.  Toes are pink.  Cap refill less than 2 sec.  No swelling, no edema, no chords, no redness to bilateral legs.  Good radial pulses.  Right anterior shoulder scar.  Left anterior shoulder scar. No obvious abnormalities to his spine.    Neurological: He is alert and oriented to person, place, and time.  Skin: Skin is warm and dry.  Psychiatric: He has a normal mood and affect. His behavior is normal.    ED Course  Procedures   DIAGNOSTIC STUDIES: Oxygen Saturation is 97% on room air, normal by my interpretation.    COORDINATION OF CARE:  7:15 PM Ordered: DG Chest Port 1 View; CBC; Basic metabolic panel; Troponin I   Labs Reviewed  CBC - Abnormal; Notable for the following:    RBC 3.95 (*)    HCT 38.2 (*)    All other components within normal limits  BASIC METABOLIC PANEL - Abnormal; Notable for the following:    Glucose, Bld 115 (*)    All other components within normal limits  TROPONIN I  HEPATIC FUNCTION PANEL   Dg Chest Port 1 View  03/10/2012  *RADIOLOGY REPORT*  Clinical Data: Chest pain.  PORTABLE CHEST - 1 VIEW  Comparison: 01/07/2012  Findings: Upper normal heart size.  Tortuous aorta.  Postoperative changes.  Chronic pleural changes at the lateral left base.  Lungs are clear.  No pneumothorax.  IMPRESSION: No active cardiopulmonary disease.  Chronic changes.   Original Report Authenticated By: Jolaine Click, M.D.  No diagnosis found.  I personally performed the services described in this documentation, which was  scribed in my presence. The recorded information has been reviewed and considered.   Date: 03/10/2012  Rate: 62  Rhythm: normal sinus rhythm  QRS Axis: normal  Intervals: normal  ST/T Wave abnormalities: t wave inversion in inferior leads  Conduction Disutrbances:none  Narrative Interpretation:   Old EKG Reviewed: unchanged   MDM     Chest pain with significant cardiac history. Workup as above including EKG, labs and imaging. He has stable dissection. Troponin/ EKG unchanged.  Patient with ongoing constant pain unrelieved here with nitro.  Patient wishes to leave ama and will contact Dr. Anne Fu in a.m.   He understands he is at significant cardiac risk and that he can return at any time.     Jim Quarry, MD 03/10/12 2156

## 2012-03-10 NOTE — ED Notes (Signed)
Pt c/o chest/epigastric pain x 3 days. Pt states he took 4 nitro with very little effect on pain. Pt also c/o n/v, sob and dizziness.

## 2012-06-15 ENCOUNTER — Emergency Department (HOSPITAL_COMMUNITY): Payer: Medicare PPO

## 2012-06-15 ENCOUNTER — Emergency Department (HOSPITAL_COMMUNITY)
Admission: EM | Admit: 2012-06-15 | Discharge: 2012-06-15 | Discharge status: Against Medical Advice | Payer: Medicare PPO | Attending: Emergency Medicine | Admitting: Emergency Medicine

## 2012-06-15 ENCOUNTER — Encounter (HOSPITAL_COMMUNITY): Payer: Self-pay

## 2012-06-15 DIAGNOSIS — I10 Essential (primary) hypertension: Secondary | ICD-10-CM | POA: Insufficient documentation

## 2012-06-15 DIAGNOSIS — Z79899 Other long term (current) drug therapy: Secondary | ICD-10-CM | POA: Insufficient documentation

## 2012-06-15 DIAGNOSIS — G8929 Other chronic pain: Secondary | ICD-10-CM | POA: Insufficient documentation

## 2012-06-15 DIAGNOSIS — Z9861 Coronary angioplasty status: Secondary | ICD-10-CM | POA: Insufficient documentation

## 2012-06-15 DIAGNOSIS — R079 Chest pain, unspecified: Secondary | ICD-10-CM

## 2012-06-15 DIAGNOSIS — R109 Unspecified abdominal pain: Secondary | ICD-10-CM

## 2012-06-15 DIAGNOSIS — R110 Nausea: Secondary | ICD-10-CM | POA: Insufficient documentation

## 2012-06-15 DIAGNOSIS — Z8719 Personal history of other diseases of the digestive system: Secondary | ICD-10-CM | POA: Insufficient documentation

## 2012-06-15 DIAGNOSIS — F17200 Nicotine dependence, unspecified, uncomplicated: Secondary | ICD-10-CM | POA: Insufficient documentation

## 2012-06-15 DIAGNOSIS — Z951 Presence of aortocoronary bypass graft: Secondary | ICD-10-CM | POA: Insufficient documentation

## 2012-06-15 DIAGNOSIS — Q391 Atresia of esophagus with tracheo-esophageal fistula: Secondary | ICD-10-CM | POA: Insufficient documentation

## 2012-06-15 DIAGNOSIS — I2510 Atherosclerotic heart disease of native coronary artery without angina pectoris: Secondary | ICD-10-CM | POA: Insufficient documentation

## 2012-06-15 DIAGNOSIS — M549 Dorsalgia, unspecified: Secondary | ICD-10-CM | POA: Insufficient documentation

## 2012-06-15 DIAGNOSIS — R0602 Shortness of breath: Secondary | ICD-10-CM | POA: Insufficient documentation

## 2012-06-15 DIAGNOSIS — Z8679 Personal history of other diseases of the circulatory system: Secondary | ICD-10-CM | POA: Insufficient documentation

## 2012-06-15 DIAGNOSIS — R1013 Epigastric pain: Secondary | ICD-10-CM | POA: Insufficient documentation

## 2012-06-15 DIAGNOSIS — E7800 Pure hypercholesterolemia, unspecified: Secondary | ICD-10-CM | POA: Insufficient documentation

## 2012-06-15 DIAGNOSIS — Z8711 Personal history of peptic ulcer disease: Secondary | ICD-10-CM | POA: Insufficient documentation

## 2012-06-15 HISTORY — DX: Chronic fatigue, unspecified: R53.82

## 2012-06-15 HISTORY — DX: Other chronic pain: G89.29

## 2012-06-15 HISTORY — DX: Peptic ulcer, site unspecified, unspecified as acute or chronic, without hemorrhage or perforation: K27.9

## 2012-06-15 LAB — CBC WITH DIFFERENTIAL/PLATELET
Eosinophils Relative: 4 % (ref 0–5)
HCT: 37.9 % — ABNORMAL LOW (ref 39.0–52.0)
Hemoglobin: 13.2 g/dL (ref 13.0–17.0)
Lymphocytes Relative: 31 % (ref 12–46)
MCHC: 34.8 g/dL (ref 30.0–36.0)
MCV: 97.2 fL (ref 78.0–100.0)
Monocytes Absolute: 0.8 10*3/uL (ref 0.1–1.0)
Monocytes Relative: 10 % (ref 3–12)
Neutro Abs: 4.5 10*3/uL (ref 1.7–7.7)
RDW: 12.7 % (ref 11.5–15.5)
WBC: 8.1 10*3/uL (ref 4.0–10.5)

## 2012-06-15 LAB — HEPATIC FUNCTION PANEL
ALT: 8 U/L (ref 0–53)
AST: 17 U/L (ref 0–37)
Alkaline Phosphatase: 56 U/L (ref 39–117)
Bilirubin, Direct: <0.1 mg/dL (ref 0.0–0.3)
Total Bilirubin: 0.2 mg/dL — ABNORMAL LOW (ref 0.3–1.2)

## 2012-06-15 LAB — POCT I-STAT, CHEM 8
Calcium, Ion: 1.13 mmol/L (ref 1.13–1.30)
Chloride: 100 mEq/L (ref 96–112)
Creatinine, Ser: 1 mg/dL (ref 0.50–1.35)
Glucose, Bld: 101 mg/dL — ABNORMAL HIGH (ref 70–99)
Potassium: 3.5 mEq/L (ref 3.5–5.1)

## 2012-06-15 MED ORDER — ALBUTEROL SULFATE (5 MG/ML) 0.5% IN NEBU
5.0000 mg | INHALATION_SOLUTION | Freq: Once | RESPIRATORY_TRACT | Status: AC
  Administered 2012-06-15: 5 mg via RESPIRATORY_TRACT
  Filled 2012-06-15: qty 1

## 2012-06-15 MED ORDER — FAMOTIDINE IN NACL 20-0.9 MG/50ML-% IV SOLN
20.0000 mg | Freq: Once | INTRAVENOUS | Status: AC
  Administered 2012-06-15: 20 mg via INTRAVENOUS
  Filled 2012-06-15: qty 50

## 2012-06-15 MED ORDER — IOHEXOL 350 MG/ML SOLN: 100.0000 mL | Freq: Once | INTRAVENOUS | Status: DC | PRN

## 2012-06-15 MED ORDER — ASPIRIN 81 MG PO CHEW
324.0000 mg | CHEWABLE_TABLET | Freq: Once | ORAL | Status: AC
  Administered 2012-06-15: 324 mg via ORAL
  Filled 2012-06-15: qty 4

## 2012-06-15 MED ORDER — MORPHINE SULFATE 4 MG/ML IJ SOLN
4.0000 mg | INTRAMUSCULAR | Status: DC | PRN
  Administered 2012-06-15: 4 mg via INTRAVENOUS
  Filled 2012-06-15: qty 1

## 2012-06-15 MED ORDER — GI COCKTAIL ~~LOC~~: 30.0000 mL | Freq: Once | ORAL | Status: DC

## 2012-06-15 MED ORDER — IOHEXOL 350 MG/ML SOLN
100.0000 mL | Freq: Once | INTRAVENOUS | Status: AC | PRN
  Administered 2012-06-15: 100 mL via INTRAVENOUS

## 2012-06-15 MED ORDER — SODIUM CHLORIDE 0.9 % IV SOLN
Freq: Once | INTRAVENOUS | Status: AC
  Administered 2012-06-15: 16:00:00 via INTRAVENOUS

## 2012-06-15 MED ORDER — GI COCKTAIL ~~LOC~~
30.0000 mL | Freq: Once | ORAL | Status: AC
  Administered 2012-06-15: 30 mL via ORAL
  Filled 2012-06-15: qty 30

## 2012-06-15 MED ORDER — PANTOPRAZOLE SODIUM 20 MG PO TBEC: 20.0000 mg | DELAYED_RELEASE_TABLET | Freq: Two times a day (BID) | ORAL | Status: DC

## 2012-06-15 MED ORDER — IPRATROPIUM BROMIDE 0.02 % IN SOLN
0.5000 mg | Freq: Once | RESPIRATORY_TRACT | Status: AC
  Administered 2012-06-15: 0.5 mg via RESPIRATORY_TRACT
  Filled 2012-06-15: qty 2.5

## 2012-06-15 MED ORDER — PANTOPRAZOLE SODIUM 40 MG IV SOLR
40.0000 mg | Freq: Once | INTRAVENOUS | Status: AC
  Administered 2012-06-15: 40 mg via INTRAVENOUS
  Filled 2012-06-15: qty 40

## 2012-06-15 NOTE — ED Notes (Signed)
EKG performed in triage

## 2012-06-15 NOTE — ED Notes (Signed)
C/o left sided chest pain x 5 days, worse with cough and movement.

## 2012-06-15 NOTE — ED Notes (Signed)
Pt reports hx of angina.  Pt reports cp for the past 5 days.  Pt reports taking nitroglycerin tablet on Friday w/out relief.  Pt reports nausea, and some sob.

## 2012-06-15 NOTE — ED Provider Notes (Signed)
History     CSN: [id number]  Arrival date & time 06/15/12  1336   First MD Initiated Contact with Patient 06/15/12 1516      Chief Complaint  Patient presents with  . Chest Pain     HPI Pt was seen at 1545.  Per pt, c/o gradual onset and persistence of constant lower mid-sternal chest "pain" that began 5 days ago. Pt describes the pain as "stabbing" and "pressure." Has been associated with SOB and nausea.  Pain worsens with coughing and movement of his torso. States his symptoms have been continuous and without relief from his own SL ntg or rest. Cannot recall injury. Denies wheezing, no back pain that is different from his usual chronic back pain, no palpitations, no vomiting/diarrhea, no fevers, no rash.     Past Medical History  Diagnosis Date  . Cardiac tamponade   . Aortic dissection     TYPE 3  . HTN (hypertension)   . CAD (coronary artery disease)   . Hypercholesterolemia   . Chronic back pain   . Hiatal hernia   . Schatzki's ring   . Chronic fatigue   . Chronic pain   . Peptic ulcer disease 08/2010    EGD     Past Surgical History  Procedure Laterality Date  . Sternal wire removeal  12/31/2007    [facility]  . Cabg x 6  07/31/2007    [facility]  . Cardiac catheterization    . Back surgery    . Knee surgery    . Shoulder surgery      Family History  Problem Relation Age of Onset  . Heart disease Mother   . Heart disease Father     History  Substance Use Topics  . Smoking status: Current Some Day Smoker -- 0.50 packs/day    Types: Cigarettes  . Smokeless tobacco: Never Used  . Alcohol Use: No      Review of Systems ROS: Statement: All systems negative except as marked or noted in the HPI; Constitutional: Negative for fever and chills. ; ; Eyes: Negative for eye pain, redness and discharge. ; ; ENMT: Negative for ear pain, hoarseness, nasal congestion, sinus pressure and sore throat. ; ; Cardiovascular: +CP, SOB. Negative for palpitations,  diaphoresis, and peripheral edema. ; ; Respiratory: Negative for cough, wheezing and stridor. ; ; Gastrointestinal: +nausea. Negative for vomiting, diarrhea, abdominal pain, blood in stool, hematemesis, jaundice and rectal bleeding. . ; ; Genitourinary: Negative for dysuria, flank pain and hematuria. ; ; Musculoskeletal: Negative for back pain and neck pain. Negative for swelling and trauma.; ; Skin: Negative for pruritus, rash, abrasions, blisters, bruising and skin lesion.; ; Neuro: Negative for headache, lightheadedness and neck stiffness. Negative for weakness, altered level of consciousness , altered mental status, extremity weakness, paresthesias, involuntary movement, seizure and syncope.       Allergies  Review of patient's allergies indicates no known allergies.  Home Medications   Current Outpatient Rx  Name  Route  Sig  Dispense  Refill  . ezetimibe (ZETIA) 10 MG tablet   Oral   Take 10 mg by mouth daily.         . fentaNYL (DURAGESIC - DOSED MCG/HR) 50 MCG/HR   Transdermal   Place 1 patch onto the skin every 3 (three) days.          . isosorbide mononitrate (IMDUR) 60 MG 24 hr tablet   Oral   Take 60 mg by mouth 2 (two) times daily.         Marland Kitchen  metoprolol tartrate (LOPRESSOR) 25 MG tablet   Oral   Take 25 mg by mouth 2 (two) times daily.           . nitroGLYCERIN (NITROSTAT) 0.4 MG SL tablet   Sublingual   Place 0.4 mg under the tongue every 5 (five) minutes as needed. Chest pain         . Omega-3 Fatty Acids (FISH OIL) 1000 MG CAPS   Oral   Take 1 capsule by mouth daily.         Marland Kitchen oxyCODONE-acetaminophen (PERCOCET) 10-325 MG per tablet   Oral   Take 1 tablet by mouth every 6 (six) hours as needed for pain.          . pantoprazole (PROTONIX) 40 MG tablet   Oral   Take 40 mg by mouth 2 (two) times daily.          . rosuvastatin (CRESTOR) 20 MG tablet   Oral   Take 20 mg by mouth daily.         . sertraline (ZOLOFT) 50 MG tablet   Oral    Take 50 mg by mouth 2 (two) times daily.           BP 100/71  Pulse 58  Temp(Src) 97.8 F (36.6 C) (Oral)  Resp 15  SpO2 100%  Physical Exam 1550: Physical examination:  Nursing notes reviewed; Vital signs and O2 SAT reviewed;  Constitutional: Well developed, Well nourished, Well hydrated, In no acute distress; Head:  Normocephalic, atraumatic; Eyes: EOMI, PERRL, No scleral icterus; ENMT: Mouth and pharynx normal, Mucous membranes moist; Neck: Supple, Full range of motion, No lymphadenopathy; Cardiovascular: Regular rate and rhythm, No gallop; Respiratory: Breath sounds diminished & equal bilaterally, No wheezes. +non-productive cough during exam. Speaking full sentences with ease, Normal respiratory effort/excursion; Chest: Nontender, Movement normal; Abdomen: Soft, +mid-epigastric area tenderness to palp. No rebound or guarding. Nondistended, Normal bowel sounds; Genitourinary: No CVA tenderness; Extremities: Pulses normal, No tenderness, No edema, No calf edema or asymmetry.; Neuro: AA&Ox3, Major CN grossly intact.  Speech clear. No gross focal motor or sensory deficits in extremities.; Skin: Color normal, Warm, Dry.   ED Course  Procedures   02/2008 Cardiac Catheterization:  FINAL IMPRESSION:  1. Atherosclerotic cardiovascular disease, severe three-vessel.  2. Intact left ventricular size and global systolic function, ejection fraction 60% without regional wall motion.  3. Mild mitral regurgitation.  4. Thoracic aortic aneurysm initiating at the origin of the left circumflex with minimal visible dissections seen.  5. Occluded saphenous vein graft to the right coronary and saphenous vein graft to the obtuse marginal.  6. Patent saphenous vein graft to the diagonal and patent free left internal mammary artery to the ramus. These conduits form a Y graft.  7. Occluded saphenous vein graft to the obtuse marginal.  PLAN/RECOMMENDATIONS: Unfortunately, it does not appear that a percutaneous  option is available for revascularization of the right coronary artery. The disease is too extensive and there is significant distal vessel disease. The significant obstruction in the left circumflex would not improve the myocardial perfusion to any great degree given its distal location of small distal vessels as well.  Therefore, medical therapy only is indicated. The patient has been recently initiated on amlodipine with an improvement in control of systemic hypertension. It should also help with his angina pectoris. We will consider addition of isosorbide long acting when the patient returns to the office. He will be discharged as an outpatient without change in his  medications at this time.  Francisca December, M.D.  Electronically Signed    MDM  MDM Reviewed: previous chart, nursing note and vitals Reviewed previous: labs, ECG and CT scan Interpretation: labs, ECG, x-ray and CT scan      Date: 06/15/2012  Rate: 60  Rhythm: normal sinus rhythm, artifact  QRS Axis: normal  Intervals: normal  ST/T Wave abnormalities: nonspecific ST/T changes  Conduction Disutrbances:none  Narrative Interpretation:   Old EKG Reviewed: artifact precludes comparison to previous EKG; no gross changes.   Date: 06/15/2012  Rate: 52  Rhythm: normal sinus rhythm  QRS Axis: normal  Intervals: normal  ST/T Wave abnormalities: nonspecific ST/T changes, TWI leads II, III, aVF, flatted T-wave lead V6  Conduction Disutrbances:none  Narrative Interpretation:   Old EKG Reviewed: unchanged; no significant changes from previous EKG dated 03/10/2012.   Results for orders placed during the hospital encounter of 06/15/12  CBC WITH DIFFERENTIAL      Result Value Range   WBC 8.1  4.0 - 10.5 K/uL   RBC 3.90 (*) 4.22 - 5.81 MIL/uL   Hemoglobin 13.2  13.0 - 17.0 g/dL   HCT 96.0 (*) 45.4 - 09.8 %   MCV 97.2  78.0 - 100.0 fL   MCH 33.8  26.0 - 34.0 pg   MCHC 34.8  30.0 - 36.0 g/dL   RDW 11.9  14.7 - 82.9 %    Platelets 151  150 - 400 K/uL   Neutrophils Relative % 55  43 - 77 %   Neutro Abs 4.5  1.7 - 7.7 K/uL   Lymphocytes Relative 31  12 - 46 %   Lymphs Abs 2.5  0.7 - 4.0 K/uL   Monocytes Relative 10  3 - 12 %   Monocytes Absolute 0.8  0.1 - 1.0 K/uL   Eosinophils Relative 4  0 - 5 %   Eosinophils Absolute 0.3  0.0 - 0.7 K/uL   Basophils Relative 0  0 - 1 %   Basophils Absolute 0.0  0.0 - 0.1 K/uL  TROPONIN I      Result Value Range   Troponin I <0.30  <0.30 ng/mL  HEPATIC FUNCTION PANEL      Result Value Range   Total Protein 7.0  6.0 - 8.3 g/dL   Albumin 3.9  3.5 - 5.2 g/dL   AST 17  0 - 37 U/L   ALT 8  0 - 53 U/L   Alkaline Phosphatase 56  39 - 117 U/L   Total Bilirubin 0.2 (*) 0.3 - 1.2 mg/dL   Bilirubin, Direct <5.6  0.0 - 0.3 mg/dL   Indirect Bilirubin NOT CALCULATED  0.3 - 0.9 mg/dL  LIPASE, BLOOD      Result Value Range   Lipase 27  11 - 59 U/L  POCT I-STAT, CHEM 8      Result Value Range   Sodium 137  135 - 145 mEq/L   Potassium 3.5  3.5 - 5.1 mEq/L   Chloride 100  96 - 112 mEq/L   BUN 11  6 - 23 mg/dL   Creatinine, Ser 2.13  0.50 - 1.35 mg/dL   Glucose, Bld 086 (*) 70 - 99 mg/dL   Calcium, Ion 5.78  4.69 - 1.30 mmol/L   TCO2 30  0 - 100 mmol/L   Hemoglobin 13.6  13.0 - 17.0 g/dL   HCT 62.9  52.8 - 41.3 %   Dg Chest 2 View 06/15/2012   *RADIOLOGY REPORT*  Clinical Data:  Chest pain.  Prior coronary artery disease.  Prior history of Type B aortic dissection.  CHEST - 2 VIEW  Comparison: CTA chest 03/10/2012 and portable chest x-ray at that same date.  Findings: Prior sternotomy for CABG.  Cardiac silhouette normal in size.  Thoracic aorta tortuous, ectatic, and atherosclerotic, unchanged in the interval.  Hilar and mediastinal contours otherwise unremarkable.  Stable hyperinflation.  Lungs clear. Bronchovascular markings normal.  Pulmonary vascularity normal.  No pneumothorax.  No pleural effusions.  Mild degenerative changes involving the thoracic spine.  IMPRESSION:  Hyperinflation consistent with COPD and/or asthma.  No acute cardiopulmonary disease.  Stable thoracic aortic tortuosity and ectasia related to previously identified Type B aortic dissection.   Original Report Authenticated By: Hulan Saas, M.D.   Ct Angio Chest W/cm &/or Wo Cm 06/15/2012   *RADIOLOGY REPORT*  Clinical Data:  History of aortic dissection.  Chest pain for 5 days.  Coronary artery bypass.  CT ANGIOGRAPHY CHEST, ABDOMEN AND PELVIS  Technique:  Multidetector CT imaging through the chest, abdomen and pelvis was performed using the standard protocol during bolus administration of intravenous contrast.  Multiplanar reconstructed images including MIPs were obtained and reviewed to evaluate the vascular anatomy.  Contrast: OMNIPAQUE IOHEXOL 350 MG/ML SOLN  Comparison:  CT 03/10/2012  CTA CHEST  Findings:  Precontrast exam demonstrates no intramural hematoma within the thoracic aorta.  Contrast enhanced imaging demonstrates an aortic dissection flap beginning in the transverse arch at the level of the left subclavian artery and extending through the descending thoracic aorta into the abdominal aorta and terminating below the renal arteries.  There is no change in the dimensions or positioning of the dissection.  The true lumen is along the medial portion of the descending thoracic aorta.  The transverse arch is dilated 37 mm the ascending aorta is normal diameter at 30 mm and descending thoracic aorta is dilated at 39 mm.  These measurements are unchanged from prior.  Post coronary artery bypass graft anatomy.  No pericardial fluid.  Review of the lung windows demonstrates no pneumothorax or pleural fluid.  No suspicious pulmonary nodules.  There is no axillary supraclavicular lymphadenopathy.  There is no central pulmonary embolism.   Review of the MIP images confirms the above findings.  IMPRESSION:  1.  No change in the Type B thoracic aortic dissection extending from transverse arch into the  abdominal aorta. 2.  Stable aneurysmal dilatation of the descending thoracic aorta. 3.  No evidence of central pulmonary embolism.  CTA ABDOMEN AND PELVIS  Findings:  The celiac trunk is fed by the true and false lumen. The SMA originate from the true lumen as well as the right renal artery.  The left renal artery is partially supplied by the true and false lumen.  This pattern is unchanged from prior.  Abdominal aorta is nonaneurysmal.  The iliac arteries are normal.  No focal hepatic lesion.  The gallbladder, pancreas, spleen, adrenal glands, kidneys are normal.  The right kidney enhances slightly more than the left kidney.  This finding is also unchanged from prior.  The stomach, small bowel, appendix, and colon unremarkable.  No abdominal lymphadenopathy.  The bladder is thick-walled.  Prostate gland is normal.  No pelvic lymphadenopathy.  Review of bone windows demonstrates posterior lumbar fusion.   Review of the MIP images confirms the above findings.  IMPRESSION:  1.  Stable dissection flap within the abdominal aorta. 2.  Asymmetric renal perfusion owing to the left kidney being perfused by  the false lumen.  This is unchanged. 3.  No acute findings in the abdomen or pelvis. 4.  Posterior lumbar fusion. 5.  Thick-walled bladder.   Original Report Authenticated By: Genevive Bi, M.D.   Ct Angio Abdomen W/cm &/or Wo Contrast 06/15/2012   *RADIOLOGY REPORT*  Clinical Data:  History of aortic dissection.  Chest pain for 5 days.  Coronary artery bypass.  CT ANGIOGRAPHY CHEST, ABDOMEN AND PELVIS  Technique:  Multidetector CT imaging through the chest, abdomen and pelvis was performed using the standard protocol during bolus administration of intravenous contrast.  Multiplanar reconstructed images including MIPs were obtained and reviewed to evaluate the vascular anatomy.  Contrast: OMNIPAQUE IOHEXOL 350 MG/ML SOLN  Comparison:  CT 03/10/2012  CTA CHEST  Findings:  Precontrast exam demonstrates no  intramural hematoma within the thoracic aorta.  Contrast enhanced imaging demonstrates an aortic dissection flap beginning in the transverse arch at the level of the left subclavian artery and extending through the descending thoracic aorta into the abdominal aorta and terminating below the renal arteries.  There is no change in the dimensions or positioning of the dissection.  The true lumen is along the medial portion of the descending thoracic aorta.  The transverse arch is dilated 37 mm the ascending aorta is normal diameter at 30 mm and descending thoracic aorta is dilated at 39 mm.  These measurements are unchanged from prior.  Post coronary artery bypass graft anatomy.  No pericardial fluid.  Review of the lung windows demonstrates no pneumothorax or pleural fluid.  No suspicious pulmonary nodules.  There is no axillary supraclavicular lymphadenopathy.  There is no central pulmonary embolism.   Review of the MIP images confirms the above findings.  IMPRESSION:  1.  No change in the Type B thoracic aortic dissection extending from transverse arch into the abdominal aorta. 2.  Stable aneurysmal dilatation of the descending thoracic aorta. 3.  No evidence of central pulmonary embolism.  CTA ABDOMEN AND PELVIS  Findings:  The celiac trunk is fed by the true and false lumen. The SMA originate from the true lumen as well as the right renal artery.  The left renal artery is partially supplied by the true and false lumen.  This pattern is unchanged from prior.  Abdominal aorta is nonaneurysmal.  The iliac arteries are normal.  No focal hepatic lesion.  The gallbladder, pancreas, spleen, adrenal glands, kidneys are normal.  The right kidney enhances slightly more than the left kidney.  This finding is also unchanged from prior.  The stomach, small bowel, appendix, and colon unremarkable.  No abdominal lymphadenopathy.  The bladder is thick-walled.  Prostate gland is normal.  No pelvic lymphadenopathy.  Review of bone  windows demonstrates posterior lumbar fusion.   Review of the MIP images confirms the above findings.  IMPRESSION:  1.  Stable dissection flap within the abdominal aorta. 2.  Asymmetric renal perfusion owing to the left kidney being perfused by the false lumen.  This is unchanged. 3.  No acute findings in the abdomen or pelvis. 4.  Posterior lumbar fusion. 5.  Thick-walled bladder.   Original Report Authenticated By: Genevive Bi, M.D.    [phone]:   Pt informed re: dx testing results, including having multiple risk factors for ACS, and that I recommend admission for further evaluation.  Pt refuses admission.  I encouraged pt to stay, continues to refuse.  Pt makes his own medical decisions.  Risks of AMA explained to pt, including, but not limited  to:  stroke, heart attack, cardiac arrythmia ("irregular heart rate/beat"), "passing out," temporary and/or permanent disability, death.  Pt verb understanding and continue to refuse admission, understanding the consequences of his decision.  I encouraged pt to follow up with his Cardiologist tomorrow and his GI doctor within the next 3 days and return to the ED immediately if symptoms return, or for any other concerns.  Pt verb understanding, agreeable. Pt is asking for a refill of his protonix; states he "ran out" 2 days ago. Will refill.            Laray Anger, DO 06/16/12 1533

## 2012-06-15 NOTE — ED Notes (Addendum)
Patient verbalized understanding of risks with leaving AMA.  

## 2012-08-14 ENCOUNTER — Emergency Department (HOSPITAL_COMMUNITY): Payer: Medicare PPO

## 2012-08-14 ENCOUNTER — Encounter (HOSPITAL_COMMUNITY): Payer: Self-pay | Admitting: Emergency Medicine

## 2012-08-14 ENCOUNTER — Emergency Department (HOSPITAL_COMMUNITY)
Admission: EM | Admit: 2012-08-14 | Discharge: 2012-08-14 | Disposition: A | Discharge status: Home / Self Care | Payer: Medicare PPO | Attending: Emergency Medicine | Admitting: Emergency Medicine

## 2012-08-14 DIAGNOSIS — R21 Rash and other nonspecific skin eruption: Secondary | ICD-10-CM | POA: Insufficient documentation

## 2012-08-14 DIAGNOSIS — R000 Tachycardia, unspecified: Secondary | ICD-10-CM | POA: Insufficient documentation

## 2012-08-14 DIAGNOSIS — Z8679 Personal history of other diseases of the circulatory system: Secondary | ICD-10-CM | POA: Insufficient documentation

## 2012-08-14 DIAGNOSIS — Z951 Presence of aortocoronary bypass graft: Secondary | ICD-10-CM | POA: Insufficient documentation

## 2012-08-14 DIAGNOSIS — G8929 Other chronic pain: Secondary | ICD-10-CM | POA: Insufficient documentation

## 2012-08-14 DIAGNOSIS — M25422 Effusion, left elbow: Secondary | ICD-10-CM

## 2012-08-14 DIAGNOSIS — F17200 Nicotine dependence, unspecified, uncomplicated: Secondary | ICD-10-CM | POA: Insufficient documentation

## 2012-08-14 DIAGNOSIS — E7800 Pure hypercholesterolemia, unspecified: Secondary | ICD-10-CM | POA: Insufficient documentation

## 2012-08-14 DIAGNOSIS — M25429 Effusion, unspecified elbow: Secondary | ICD-10-CM | POA: Insufficient documentation

## 2012-08-14 DIAGNOSIS — I10 Essential (primary) hypertension: Secondary | ICD-10-CM | POA: Insufficient documentation

## 2012-08-14 DIAGNOSIS — I2510 Atherosclerotic heart disease of native coronary artery without angina pectoris: Secondary | ICD-10-CM | POA: Insufficient documentation

## 2012-08-14 DIAGNOSIS — Z8719 Personal history of other diseases of the digestive system: Secondary | ICD-10-CM | POA: Insufficient documentation

## 2012-08-14 DIAGNOSIS — R062 Wheezing: Secondary | ICD-10-CM | POA: Insufficient documentation

## 2012-08-14 DIAGNOSIS — M25529 Pain in unspecified elbow: Secondary | ICD-10-CM | POA: Insufficient documentation

## 2012-08-14 DIAGNOSIS — Z79899 Other long term (current) drug therapy: Secondary | ICD-10-CM | POA: Insufficient documentation

## 2012-08-14 NOTE — ED Notes (Signed)
Pt c/ol pain/swelling/redness to left elbow since last night. denies injury.

## 2012-08-14 NOTE — ED Provider Notes (Signed)
CSN: [id number]     Arrival date & time 08/14/12  0825 History     First MD Initiated Contact with Patient 08/14/12 [phone]     Chief Complaint  Patient presents with  . Joint Swelling   (Consider location/radiation/quality/duration/timing/severity/associated sxs/prior Treatment) The history is provided by the patient.   [name] is a 62 y.o. male who presents to the ED with joint pain. Patient states he first noted the pain in his left elbow yesterday. There was some pain during the day but he had on a long sleeve shirt and just continued to work. In the evening while taking a shower he bumped his elbow and noted that it was red and swollen. He does not remember any injury to the area. He has never had anything like this before. He rates the pain as 8/10. The pain is worse with any touch at all to the area. The area is swollen and red. Patient states he realizes he has multiple health problems but he is only here today for his left elbow swelling and pain.   Past Medical History  Diagnosis Date  . Cardiac tamponade   . Aortic dissection     TYPE 3  . HTN (hypertension)   . CAD (coronary artery disease)   . Hypercholesterolemia   . Chronic back pain   . Hiatal hernia   . Schatzki's ring   . Chronic fatigue   . Chronic pain   . Peptic ulcer disease 08/2010    EGD    Past Surgical History  Procedure Laterality Date  . Sternal wire removeal  12/31/2007    [facility]  . Cabg x 6  07/31/2007    [facility]  . Cardiac catheterization    . Back surgery    . Knee surgery    . Shoulder surgery     Family History  Problem Relation Age of Onset  . Heart disease Mother   . Heart disease Father    History  Substance Use Topics  . Smoking status: Current Some Day Smoker -- 0.50 packs/day    Types: Cigarettes  . Smokeless tobacco: Never Used  . Alcohol Use: No    Review of Systems  Constitutional: Negative for fever and chills.  HENT: Negative for sore throat.   Eyes:  Negative for photophobia.  Respiratory: Cough: chronic. Shortness of breath: chronic COPD.   Gastrointestinal: Negative for nausea, vomiting and abdominal pain.  Genitourinary: Negative for difficulty urinating.  Skin: Positive for rash.  Neurological: Negative for headaches.  Psychiatric/Behavioral: The patient is not nervous/anxious.     Allergies  Review of patient's allergies indicates no known allergies.  Home Medications   Current Outpatient Rx  Name  Route  Sig  Dispense  Refill  . ezetimibe (ZETIA) 10 MG tablet   Oral   Take 10 mg by mouth daily.         . fentaNYL (DURAGESIC - DOSED MCG/HR) 50 MCG/HR   Transdermal   Place 1 patch onto the skin every 3 (three) days.          . isosorbide mononitrate (IMDUR) 60 MG 24 hr tablet   Oral   Take 60 mg by mouth 2 (two) times daily.         . metoprolol tartrate (LOPRESSOR) 25 MG tablet   Oral   Take 25 mg by mouth 2 (two) times daily.           . nitroGLYCERIN (NITROSTAT) 0.4 MG SL tablet  Sublingual   Place 0.4 mg under the tongue every 5 (five) minutes as needed. Chest pain         . Omega-3 Fatty Acids (FISH OIL) 1000 MG CAPS   Oral   Take 1 capsule by mouth daily.         Marland Kitchen oxyCODONE-acetaminophen (PERCOCET) 10-325 MG per tablet   Oral   Take 1 tablet by mouth every 6 (six) hours as needed for pain.          . pantoprazole (PROTONIX) 20 MG tablet   Oral   Take 1 tablet (20 mg total) by mouth 2 (two) times daily.   40 tablet   0   . pantoprazole (PROTONIX) 40 MG tablet   Oral   Take 40 mg by mouth 2 (two) times daily.          . rosuvastatin (CRESTOR) 20 MG tablet   Oral   Take 20 mg by mouth daily.         . sertraline (ZOLOFT) 50 MG tablet   Oral   Take 50 mg by mouth 2 (two) times daily.          BP 108/64  Pulse 124  Temp(Src) 98.8 F (37.1 C) (Oral)  Resp 22  SpO2 95% Physical Exam  Nursing note and vitals reviewed. Constitutional: He is oriented to person, place,  and time. No distress.  HENT:  Head: Normocephalic.  Eyes: EOM are normal.  Neck: Neck supple.  Cardiovascular: Tachycardia present.   Pulmonary/Chest: He has wheezes.  Musculoskeletal:       Left elbow: He exhibits decreased range of motion, swelling and effusion. He exhibits no deformity and no laceration. Tenderness found. Radial head tenderness noted.  Neurological: He is alert and oriented to person, place, and time. No cranial nerve deficit.  Skin: Skin is warm and dry.  Psychiatric: He has a normal mood and affect. His behavior is normal.   Dg Elbow Complete Left  08/14/2012   *RADIOLOGY REPORT*  Clinical Data: The joint pain and swelling  LEFT ELBOW - COMPLETE 3+ VIEW  Comparison: None.  Findings: Frontal, lateral, and bilateral oblique views were obtained.  There is no fracture, dislocation, or effusion.  There is a large spur arising from the olecranon process of the proximal ulna.  There is no erosive change or appreciable joint space narrowing.  IMPRESSION: Sizable olecranon spur.  No demonstrable fracture or effusion.   Original Report Authenticated By: Bretta Bang, M.D.   Dr. Roanna Raider in to examine the patient.   ED Course: Consult with Dr. Hilda Lias and he will see the patient in the office now.   Procedures   MDM  62 y.o. male with with left elbow effusion. He will see Dr. Hilda Lias in the office now. Patient stable for discharge from the ED to go to the office.  [place], [place] 08/14/12 [phone]

## 2012-08-15 NOTE — ED Provider Notes (Signed)
Medical screening examination/treatment/procedure(s) were conducted as a shared visit with non-physician practitioner(s) and myself.  I personally evaluated the patient during the encounter   Zeva Leber L Barnard Sharps, MD 08/15/12 0841 

## 2012-08-24 ENCOUNTER — Other Ambulatory Visit: Payer: Self-pay | Admitting: Radiology

## 2012-08-25 ENCOUNTER — Encounter (HOSPITAL_COMMUNITY)
Admission: RE | Admit: 2012-08-25 | Discharge: 2012-08-25 | Disposition: A | Discharge status: Home / Self Care | Payer: Medicare PPO | Source: Ambulatory Visit | Attending: Orthopaedic Surgery | Admitting: Orthopaedic Surgery

## 2012-08-25 ENCOUNTER — Encounter (HOSPITAL_COMMUNITY): Payer: Self-pay

## 2012-08-25 ENCOUNTER — Encounter (HOSPITAL_COMMUNITY): Payer: Self-pay | Admitting: Pharmacy Technician

## 2012-08-25 LAB — CBC WITH DIFFERENTIAL/PLATELET
HCT: 40.4 % (ref 39.0–52.0)
Hemoglobin: 13.6 g/dL (ref 13.0–17.0)
Lymphocytes Relative: 24 % (ref 12–46)
Lymphs Abs: 2.2 10*3/uL (ref 0.7–4.0)
Monocytes Absolute: 0.7 10*3/uL (ref 0.1–1.0)
Monocytes Relative: 8 % (ref 3–12)
Neutro Abs: 5.8 10*3/uL (ref 1.7–7.7)
WBC: 8.9 10*3/uL (ref 4.0–10.5)

## 2012-08-25 LAB — COMPREHENSIVE METABOLIC PANEL
AST: 16 U/L (ref 0–37)
BUN: 12 mg/dL (ref 6–23)
CO2: 30 mEq/L (ref 19–32)
Chloride: 99 mEq/L (ref 96–112)
Creatinine, Ser: 0.89 mg/dL (ref 0.50–1.35)
GFR calc non Af Amer: >90 mL/min (ref >90)
Glucose, Bld: 88 mg/dL (ref 70–99)
Total Bilirubin: <0.1 mg/dL — ABNORMAL LOW (ref 0.3–1.2)

## 2012-08-25 NOTE — Patient Instructions (Addendum)
    [name]  08/25/2012   Your procedure is scheduled on:  08/27/2012  Report to [facility] at  700  AM.  Call this number if you have problems the morning of surgery: [phone]   Remember:   Do not eat food or drink liquids after midnight.   Take these medicines the morning of surgery with A SIP OF WATER: imdur, metoprolol, oxycodone, protonix, zoloft. May wear Duragesic patch in.   Do not wear jewelry, make-up or nail polish.  Do not wear lotions, powders, or perfumes.   Do not shave 48 hours prior to surgery. Men may shave face and neck.  Do not bring valuables to the hospital.  [facility] is not responsible for any belongings or valuables.  Contacts, dentures or bridgework may not be worn into surgery.  Leave suitcase in the car. After surgery it may be brought to your room.  For patients admitted to the hospital, checkout time is 11:00 AM the day of discharge.   Patients discharged the day of surgery will not be allowed to drive home.  Name and phone number of your driver: family  Special Instructions: Shower using CHG 2 nights before surgery and the night before surgery.  If you shower the day of surgery use CHG.  Use special wash - you have one bottle of CHG for all showers.  You should use approximately 1/3 of the bottle for each shower.   Please read over the following fact sheets that you were given: Pain Booklet, Coughing and Deep Breathing, Surgical Site Infection Prevention, Anesthesia Post-op Instructions and Care and Recovery After Surgery PATIENT INSTRUCTIONS POST-ANESTHESIA  IMMEDIATELY FOLLOWING SURGERY:  Do not drive or operate machinery for the first twenty four hours after surgery.  Do not make any important decisions for twenty four hours after surgery or while taking narcotic pain medications or sedatives.  If you develop intractable nausea and vomiting or a severe headache please notify your doctor immediately.  FOLLOW-UP:  Please make an appointment with your  surgeon as instructed. You do not need to follow up with anesthesia unless specifically instructed to do so.  WOUND CARE INSTRUCTIONS (if applicable):  Keep a dry clean dressing on the anesthesia/puncture wound site if there is drainage.  Once the wound has quit draining you may leave it open to air.  Generally you should leave the bandage intact for twenty four hours unless there is drainage.  If the epidural site drains for more than 36-48 hours please call the anesthesia department.  QUESTIONS?:  Please feel free to call your physician or the hospital operator if you have any questions, and they will be happy to assist you.

## 2012-08-26 LAB — URINALYSIS, ROUTINE W REFLEX MICROSCOPIC
Hgb urine dipstick: NEGATIVE
Protein, ur: NEGATIVE mg/dL
Urobilinogen, UA: 0.2 mg/dL (ref 0.0–1.0)

## 2012-08-26 NOTE — H&P (Signed)
[name] is an 62 y.o. male.   Chief Complaint: Left olecranon bursa swelling   HPI: [name] has had swelling of the left olecranon area for a few months, getting worse.  I have aspirated it but the fluid returned in a day or two.  It has gotten larger.  It hurts every time [name] rests his arm on something.  [name] has no numbness, no redness, no discharge or puncture wound.  [name] would like to have the bursa excised.  The patient has a long history of significant cardiac disease and aortic problems in the thoracic chest.  I have explained the procedure of excision of the bursa on the left elbow as an elective procedure.  [name] will have a drain placed and will need to come to the office the day after surgery to remove it.  [name] does not want to be put to sleep and I told him anesthesia could do the procedure under Bier Block.  [name] appeared to understand the procedure, the risks and imponderables.  [name] has agreed to the elective outpatient procedure.  Past Medical History  Diagnosis Date  . Cardiac tamponade   . Aortic dissection     TYPE 3  . HTN (hypertension)   . CAD (coronary artery disease)   . Hypercholesterolemia   . Chronic back pain   . Hiatal hernia   . Schatzki's ring   . Chronic fatigue   . Chronic pain   . Peptic ulcer disease 08/2010    EGD     Past Surgical History  Procedure Laterality Date  . Sternal wire removeal  12/31/2007    [facility]  . Cabg x 6  07/31/2007    [facility]  . Cardiac catheterization    . Knee surgery Right     acl  . Shoulder surgery Left   . Orif scapular fracture Right   . Back surgery      Family History  Problem Relation Age of Onset  . Heart disease Mother   . Heart disease Father    Social History:  reports that [name] has been smoking Cigarettes.  [name] has a 25 pack-year smoking history. [name] has never used smokeless tobacco. [name] reports that [name] does not drink alcohol or use illicit drugs.  Allergies: No Known Allergies  No prescriptions prior to  admission    Results for orders placed during the hospital encounter of 08/25/12 (from the past 48 hour(s))  CBC WITH DIFFERENTIAL     Status: Abnormal   Collection Time    08/25/12  9:30 AM      Result Value Range   WBC 8.9  4.0 - 10.5 K/uL   RBC 4.04 (*) 4.22 - 5.81 MIL/uL   Hemoglobin 13.6  13.0 - 17.0 g/dL   HCT 29.5  28.4 - 13.2 %   MCV 100.0  78.0 - 100.0 fL   MCH 33.7  26.0 - 34.0 pg   MCHC 33.7  30.0 - 36.0 g/dL   RDW 44.0  10.2 - 72.5 %   Platelets 200  150 - 400 K/uL   Neutrophils Relative % 65  43 - 77 %   Neutro Abs 5.8  1.7 - 7.7 K/uL   Lymphocytes Relative 24  12 - 46 %   Lymphs Abs 2.2  0.7 - 4.0 K/uL   Monocytes Relative 8  3 - 12 %   Monocytes Absolute 0.7  0.1 - 1.0 K/uL   Eosinophils Relative 2  0 - 5 %  Eosinophils Absolute 0.2  0.0 - 0.7 K/uL   Basophils Relative 0  0 - 1 %   Basophils Absolute 0.0  0.0 - 0.1 K/uL  COMPREHENSIVE METABOLIC PANEL     Status: Abnormal   Collection Time    08/25/12  9:30 AM      Result Value Range   Sodium 137  135 - 145 mEq/L   Potassium 5.2 (*) 3.5 - 5.1 mEq/L   Chloride 99  96 - 112 mEq/L   CO2 30  19 - 32 mEq/L   Glucose, Bld 88  70 - 99 mg/dL   BUN 12  6 - 23 mg/dL   Creatinine, Ser 1.61  0.50 - 1.35 mg/dL   Calcium 9.9  8.4 - 09.6 mg/dL   Total Protein 7.2  6.0 - 8.3 g/dL   Albumin 3.9  3.5 - 5.2 g/dL   AST 16  0 - 37 U/L   ALT 8  0 - 53 U/L   Alkaline Phosphatase 59  39 - 117 U/L   Total Bilirubin <0.1 (*) 0.3 - 1.2 mg/dL   GFR calc non Af Amer >90  >90 mL/min   GFR calc Af Amer >90  >90 mL/min   Comment:            The eGFR has been calculated     using the CKD EPI equation.     This calculation has not been     validated in all clinical     situations.     eGFR's persistently     <90 mL/min signify     possible Chronic Kidney Disease.   No results found.  Review of Systems  Cardiovascular:       Significant history of cardiac problems and aortic dissection in the thoracic area.  [name] also has  hypertension.  [name] is at increased risk secondary to marked cardiovascular problems.  Gastrointestinal: Positive for heartburn.  Musculoskeletal:       Left olecranon bursa swelling.  All other systems reviewed and are negative.    There were no vitals taken for this visit. Physical Exam  Constitutional: [name] is oriented to person, place, and time. [name] appears well-developed and well-nourished.  HENT:  Head: Normocephalic and atraumatic.  Eyes: Conjunctivae and EOM are normal. Pupils are equal, round, and reactive to light.  Neck: Normal range of motion. Neck supple.  Cardiovascular: Normal rate, regular rhythm, normal heart sounds and intact distal pulses.   Respiratory: Effort normal and breath sounds normal.  GI: Soft. Bowel sounds are normal.  Musculoskeletal: [name] exhibits tenderness (Pain left elbow with large olecranon bursa swelling about the size of two eggs.  It is not red.  It is tender.  It has fluid in it.  ROM of the elbow is full on the left.  NV is intact.).       Left elbow: [name] exhibits swelling and deformity.       Arms: Neurological: [name] is alert and oriented to person, place, and time. [name] has normal reflexes.  Skin: Skin is warm and dry.  Psychiatric: [name] has a normal mood and affect. His behavior is normal. Judgment and thought content normal.     Assessment/Plan Left olecranon bursitis with large bursa for excision as outpatient procedure.  [name] 08/26/2012, 7:12 AM

## 2012-08-26 NOTE — Pre-Procedure Instructions (Signed)
Patient made aware that potassium is high and that we will redraw labs in am and hopefully be able to proceded with surgery

## 2012-08-26 NOTE — Pre-Procedure Instructions (Signed)
Late entry- 08/25/2012 patient was in for preop visit at 0800. Upon arrival he had to see pharmacy about current med list, Stelios Kirby and Mitchell Heir CNA  In room  when patient tell them that he was 20 minutes late for his preop because he was having chest pain before he came. Angelique Blonder quickly came and got me to tell me of this. Upon entry to the PAT office I began questioning the patient about his chest pain and he immediately became very defensive stating, "i aint having no more damn tests because iI have chest pain. This is something that I live with so if you are planning on doing any test to me like an EKG,I will pack my shit and take my ass somewhere else". i attempted to explain to the patient the need forrrent medical history and testing, especially due to his very extensive heart history, even telling him without it he could have a heartattack and die on the table and he states, "just roll me off in the floor if that happens I aint doing no more damn tests." Waynard Edwards RN, charge nurse, Dr Jayme Cloud nd Dr Hilda Lias notified. All concur that if patient cannot cooperate and let us do his testing that he will be cancelled. BMorris RN in to talk to patient and he agreed to testing. Throughout interview he was very short with answers and sighed and rolled his eyes with each question he was asked and kept repeating," every damn thing about me is in that computer, why are we doing this again? I am tired of this."  Toward end of interview patient tried to joke and states, " I really am not a bad person I am just tired of all this". Reassured patient that we take care of him.

## 2012-08-26 NOTE — Pre-Procedure Instructions (Signed)
Dr Jayme Cloud aware of K+= 5.2. Dr Hilda Lias also made aware. Istat to be done on arrival in am.

## 2012-08-27 ENCOUNTER — Ambulatory Visit (HOSPITAL_COMMUNITY): Payer: Medicare PPO | Admitting: Anesthesiology

## 2012-08-27 ENCOUNTER — Encounter (HOSPITAL_COMMUNITY): Payer: Self-pay | Admitting: Anesthesiology

## 2012-08-27 ENCOUNTER — Encounter (HOSPITAL_COMMUNITY)
Admission: RE | Disposition: A | Discharge status: Home / Self Care | Payer: Self-pay | Source: Ambulatory Visit | Attending: Orthopaedic Surgery

## 2012-08-27 ENCOUNTER — Encounter (HOSPITAL_COMMUNITY): Payer: Self-pay | Admitting: *Deleted

## 2012-08-27 ENCOUNTER — Ambulatory Visit (HOSPITAL_COMMUNITY)
Admission: RE | Admit: 2012-08-27 | Discharge: 2012-08-27 | Disposition: A | Discharge status: Home / Self Care | Payer: Medicare PPO | Source: Ambulatory Visit | Attending: Orthopaedic Surgery | Admitting: Orthopaedic Surgery

## 2012-08-27 DIAGNOSIS — Z01812 Encounter for preprocedural laboratory examination: Secondary | ICD-10-CM | POA: Insufficient documentation

## 2012-08-27 DIAGNOSIS — Z01810 Encounter for preprocedural cardiovascular examination: Secondary | ICD-10-CM | POA: Insufficient documentation

## 2012-08-27 DIAGNOSIS — M25422 Effusion, left elbow: Secondary | ICD-10-CM

## 2012-08-27 DIAGNOSIS — I2510 Atherosclerotic heart disease of native coronary artery without angina pectoris: Secondary | ICD-10-CM | POA: Insufficient documentation

## 2012-08-27 DIAGNOSIS — Z951 Presence of aortocoronary bypass graft: Secondary | ICD-10-CM | POA: Insufficient documentation

## 2012-08-27 DIAGNOSIS — I10 Essential (primary) hypertension: Secondary | ICD-10-CM | POA: Insufficient documentation

## 2012-08-27 DIAGNOSIS — M7020 Olecranon bursitis, unspecified elbow: Secondary | ICD-10-CM | POA: Insufficient documentation

## 2012-08-27 HISTORY — PX: OLECRANON BURSECTOMY: SHX2097

## 2012-08-27 LAB — POCT I-STAT 4, (NA,K, GLUC, HGB,HCT)
Potassium: 4.1 mEq/L (ref 3.5–5.1)
Sodium: 140 mEq/L (ref 135–145)

## 2012-08-27 SURGERY — BURSECTOMY, ELBOW
Anesthesia: Regional | Site: Elbow | Laterality: Left | Wound class: Clean

## 2012-08-27 MED ORDER — MIDAZOLAM HCL 2 MG/2ML IJ SOLN
INTRAMUSCULAR | Status: AC
  Filled 2012-08-27: qty 2

## 2012-08-27 MED ORDER — FENTANYL CITRATE 0.05 MG/ML IJ SOLN: 25.0000 ug | INTRAMUSCULAR | Status: DC | PRN

## 2012-08-27 MED ORDER — ONDANSETRON HCL 4 MG/2ML IJ SOLN: 4.0000 mg | Freq: Once | INTRAMUSCULAR | Status: DC | PRN

## 2012-08-27 MED ORDER — LIDOCAINE HCL (PF) 0.5 % IJ SOLN
INTRAMUSCULAR | Status: DC | PRN
  Administered 2012-08-27: 50 mL

## 2012-08-27 MED ORDER — LACTATED RINGERS IV SOLN
INTRAVENOUS | Status: DC
  Administered 2012-08-27: 08:00:00 via INTRAVENOUS

## 2012-08-27 MED ORDER — CEFAZOLIN SODIUM-DEXTROSE 2-3 GM-% IV SOLR
INTRAVENOUS | Status: AC
  Filled 2012-08-27: qty 50

## 2012-08-27 MED ORDER — CHLORHEXIDINE GLUCONATE 4 % EX LIQD
60.0000 mL | Freq: Once | CUTANEOUS | Status: AC
  Administered 2012-08-27: 4 via TOPICAL

## 2012-08-27 MED ORDER — FENTANYL CITRATE 0.05 MG/ML IJ SOLN
25.0000 ug | INTRAMUSCULAR | Status: AC
  Administered 2012-08-27 (×2): 25 ug via INTRAVENOUS

## 2012-08-27 MED ORDER — MIDAZOLAM HCL 5 MG/5ML IJ SOLN
INTRAMUSCULAR | Status: DC | PRN
  Administered 2012-08-27: 2 mg via INTRAVENOUS

## 2012-08-27 MED ORDER — LIDOCAINE HCL (CARDIAC) 10 MG/ML IV SOLN
INTRAVENOUS | Status: DC | PRN
  Administered 2012-08-27: 50 mg via INTRAVENOUS

## 2012-08-27 MED ORDER — PROPOFOL INFUSION 10 MG/ML OPTIME
INTRAVENOUS | Status: DC | PRN
  Administered 2012-08-27: 75 ug/kg/min via INTRAVENOUS

## 2012-08-27 MED ORDER — LACTATED RINGERS IV SOLN
INTRAVENOUS | Status: DC | PRN
  Administered 2012-08-27 (×2): via INTRAVENOUS

## 2012-08-27 MED ORDER — LIDOCAINE HCL (PF) 0.5 % IJ SOLN
INTRAMUSCULAR | Status: AC
  Filled 2012-08-27: qty 50

## 2012-08-27 MED ORDER — FENTANYL CITRATE 0.05 MG/ML IJ SOLN
INTRAMUSCULAR | Status: AC
  Filled 2012-08-27: qty 5

## 2012-08-27 MED ORDER — MIDAZOLAM HCL 2 MG/2ML IJ SOLN
1.0000 mg | INTRAMUSCULAR | Status: AC | PRN
  Administered 2012-08-27 (×3): 2 mg via INTRAVENOUS

## 2012-08-27 MED ORDER — FENTANYL CITRATE 0.05 MG/ML IJ SOLN
INTRAMUSCULAR | Status: AC
  Filled 2012-08-27: qty 2

## 2012-08-27 MED ORDER — 0.9 % SODIUM CHLORIDE (POUR BTL) OPTIME
TOPICAL | Status: DC | PRN
  Administered 2012-08-27: 1000 mL

## 2012-08-27 MED ORDER — FENTANYL CITRATE 0.05 MG/ML IJ SOLN
INTRAMUSCULAR | Status: DC | PRN
  Administered 2012-08-27: 50 ug via INTRAVENOUS

## 2012-08-27 MED ORDER — CEFAZOLIN SODIUM-DEXTROSE 2-3 GM-% IV SOLR
2.0000 g | INTRAVENOUS | Status: AC
  Administered 2012-08-27: 2 g via INTRAVENOUS

## 2012-08-27 MED ORDER — ONDANSETRON HCL 4 MG/2ML IJ SOLN
INTRAMUSCULAR | Status: AC
  Filled 2012-08-27: qty 2

## 2012-08-27 MED ORDER — ONDANSETRON HCL 4 MG/2ML IJ SOLN
4.0000 mg | Freq: Once | INTRAMUSCULAR | Status: AC
  Administered 2012-08-27: 4 mg via INTRAVENOUS

## 2012-08-27 SURGICAL SUPPLY — 34 items
BAG HAMPER (MISCELLANEOUS) ×2 IMPLANT
BANDAGE ELASTIC 4 VELCRO NS (GAUZE/BANDAGES/DRESSINGS) ×1 IMPLANT
BANDAGE ESMARK 4X12 BL STRL LF (DISPOSABLE) ×1 IMPLANT
BNDG CMPR 12X4 ELC STRL LF (DISPOSABLE) ×1
BNDG ESMARK 4X12 BLUE STRL LF (DISPOSABLE) ×2
CLOTH BEACON ORANGE TIMEOUT ST (SAFETY) ×2 IMPLANT
COVER LIGHT HANDLE STERIS (MISCELLANEOUS) ×4 IMPLANT
CUFF TOURNIQUET SINGLE 18IN (TOURNIQUET CUFF) ×1 IMPLANT
DRAIN PENROSE 12X.25 LTX STRL (MISCELLANEOUS) ×2 IMPLANT
DURAPREP 26ML APPLICATOR (WOUND CARE) ×2 IMPLANT
ELECT REM PT RETURN 9FT ADLT (ELECTROSURGICAL) ×2
ELECTRODE REM PT RTRN 9FT ADLT (ELECTROSURGICAL) ×1 IMPLANT
GAUZE XEROFORM 5X9 LF (GAUZE/BANDAGES/DRESSINGS) ×2 IMPLANT
GLOVE BIO SURGEON STRL SZ8 (GLOVE) ×2 IMPLANT
GLOVE BIO SURGEON STRL SZ8.5 (GLOVE) ×2 IMPLANT
GLOVE BIOGEL PI IND STRL 7.0 (GLOVE) IMPLANT
GLOVE BIOGEL PI INDICATOR 7.0 (GLOVE) ×1
GLOVE SS BIOGEL STRL SZ 6.5 (GLOVE) IMPLANT
GLOVE SUPERSENSE BIOGEL SZ 6.5 (GLOVE) ×1
GOWN STRL REIN XL XLG (GOWN DISPOSABLE) ×4 IMPLANT
KIT ROOM TURNOVER APOR (KITS) ×2 IMPLANT
MANIFOLD NEPTUNE II (INSTRUMENTS) ×2 IMPLANT
NS IRRIG 1000ML POUR BTL (IV SOLUTION) ×2 IMPLANT
PACK BASIC LIMB (CUSTOM PROCEDURE TRAY) ×2 IMPLANT
PAD ABD 5X9 TENDERSORB (GAUZE/BANDAGES/DRESSINGS) ×4 IMPLANT
PAD ARMBOARD 7.5X6 YLW CONV (MISCELLANEOUS) ×2 IMPLANT
PAD CAST 4YDX4 CTTN HI CHSV (CAST SUPPLIES) IMPLANT
PADDING CAST COTTON 4X4 STRL (CAST SUPPLIES) ×4
SET BASIN LINEN APH (SET/KITS/TRAYS/PACK) ×2 IMPLANT
SPONGE GAUZE 4X4 12PLY (GAUZE/BANDAGES/DRESSINGS) ×2 IMPLANT
SPONGE LAP 18X18 X RAY DECT (DISPOSABLE) ×2 IMPLANT
STAPLER VISISTAT 35W (STAPLE) ×1 IMPLANT
SUT CHROMIC 2 0 CT 1 (SUTURE) ×1 IMPLANT
SUT PLAIN CT 1/2CIR 2-0 27IN (SUTURE) ×2 IMPLANT

## 2012-08-27 NOTE — Brief Op Note (Signed)
08/27/2012  10:24 AM  PATIENT:  [name]  62 y.o. male  PRE-OPERATIVE DIAGNOSIS:  left olecranon bursitis  POST-OPERATIVE DIAGNOSIS:  left olecranon bursitis  PROCEDURE:  Procedure(s): EXCISION LEFT OLECRANON BURSA (Left)  SURGEON:  Surgeon(s) and Role:    * Darreld Mclean, MD - Primary  PHYSICIAN ASSISTANT:   ASSISTANTS: none   ANESTHESIA:   regional  EBL:  Total I/O In: 1200 [I.V.:1200] Out: 2 [Blood:2]  BLOOD ADMINISTERED:none  DRAINS: Penrose drain in the left elbow area   LOCAL MEDICATIONS USED:  NONE  SPECIMEN:  Source of Specimen:  left olecranon bursa  DISPOSITION OF SPECIMEN:  PATHOLOGY  COUNTS:  YES  TOURNIQUET:   Total Tourniquet Time Documented: Upper Arm (Left) - -16109 minutes Total: Upper Arm (Left) - -60454 minutes   DICTATION: .Other Dictation: Dictation Number [id number]  PLAN OF CARE: Discharge to home after PACU  PATIENT DISPOSITION:  PACU - hemodynamically stable.   Delay start of Pharmacological VTE agent (>24hrs) due to surgical blood loss or risk of bleeding: not applicable

## 2012-08-27 NOTE — Preoperative (Signed)
Beta Blockers   Reason not to administer Beta Blockers:Not Applicable 

## 2012-08-27 NOTE — Transfer of Care (Signed)
Immediate Anesthesia Transfer of Care Note  Patient: [name]  Procedure(s) Performed: Procedure(s): EXCISION LEFT OLECRANON BURSA (Left)  Patient Location: PACU  Anesthesia Type:Bier block  Level of Consciousness: awake, alert , oriented and patient cooperative  Airway & Oxygen Therapy: Patient Spontanous Breathing  Post-op Assessment: Report given to PACU RN and Post -op Vital signs reviewed and stable  Post vital signs: Reviewed and stable  Complications: No apparent anesthesia complications

## 2012-08-27 NOTE — Anesthesia Procedure Notes (Addendum)
Anesthesia Regional Block:  Bier block (IV Regional)  Pre-Anesthetic Checklist: ,, timeout performed, Correct Patient, Correct Site, Correct Laterality, Correct Procedure,, site marked, surgical consent,, at surgeon's request  Laterality: Left     Needles:  Injection technique: Single-shot  Needle Type: Other      Needle Gauge: 22 and 22 G    Additional Needles: Bier block (IV Regional)  Nerve Stimulator or Paresthesia:   Additional Responses:  Pulse checked post tourniquet inflation. IV NSL discontinued post injection. Narrative:  Start time: 08/27/2012 9:31 AM  Performed by: Personally   Bier block (IV Regional) Date/Time: 08/27/2012 9:17 AM Performed by: Carolyne Littles, AMY L Pre-anesthesia Checklist: Patient identified, Timeout performed, Emergency Drugs available, Suction available and Patient being monitored Oxygen Delivery Method: Non-rebreather mask

## 2012-08-27 NOTE — Progress Notes (Signed)
The History and Physical is unchanged. I have examined the patient. The patient is medically able to have surgery on the left elbow . Judie Hollick 

## 2012-08-27 NOTE — Anesthesia Postprocedure Evaluation (Signed)
  Anesthesia Post-op Note  Patient: [name]  Procedure(s) Performed: Procedure(s): EXCISION LEFT OLECRANON BURSA (Left)  Patient Location: PACU  Anesthesia Type:MAC  Level of Consciousness: awake, alert , oriented and patient cooperative  Airway and Oxygen Therapy: Patient Spontanous Breathing  Post-op Pain: none  Post-op Assessment: Post-op Vital signs reviewed, Patient's Cardiovascular Status Stable, Respiratory Function Stable, Patent Airway, No signs of Nausea or vomiting and Pain level controlled  Post-op Vital Signs: Reviewed and stable  Complications: No apparent anesthesia complications

## 2012-08-27 NOTE — Anesthesia Preprocedure Evaluation (Addendum)
Anesthesia Evaluation  Patient identified by MRN, date of birth, ID band Patient awake    Reviewed: Allergy & Precautions, H&P , NPO status , Patient's Chart, lab work & pertinent test results, reviewed documented beta blocker date and time   Airway Mallampati: II TM Distance: >3 FB     Dental  (+) Edentulous Upper and Implants   Pulmonary Current Smoker,  breath sounds clear to auscultation        Cardiovascular hypertension, Pt. on medications and Pt. on home beta blockers + CAD, + CABG and + Peripheral Vascular Disease (aortic dissection) Rhythm:Regular Rate:Normal     Neuro/Psych    GI/Hepatic hiatal hernia, PUD,   Endo/Other    Renal/GU      Musculoskeletal   Abdominal   Peds  Hematology   Anesthesia Other Findings   Reproductive/Obstetrics                          Anesthesia Physical Anesthesia Plan  ASA: III  Anesthesia Plan: Bier Block   Post-op Pain Management:    Induction: Intravenous  Airway Management Planned: Nasal Cannula  Additional Equipment:   Intra-op Plan:   Post-operative Plan:   Informed Consent: I have reviewed the patients History and Physical, chart, labs and discussed the procedure including the risks, benefits and alternatives for the proposed anesthesia with the patient or authorized representative who has indicated his/her understanding and acceptance.     Plan Discussed with:   Anesthesia Plan Comments: (Possible Gen LMA discussed and he agrees with plan.)        Anesthesia Quick Evaluation

## 2012-08-28 ENCOUNTER — Encounter (HOSPITAL_COMMUNITY): Payer: Self-pay | Admitting: Orthopaedic Surgery

## 2012-08-28 NOTE — Op Note (Signed)
[name], [name] NO.:  [id number]  MEDICAL RECORD NO.:  [id number]  LOCATION:  [facility]                          FACILITY:  [facility]  PHYSICIAN:  J. Darreld Mclean, M.D. DATE OF BIRTH:  23-Feb-1950  DATE OF PROCEDURE: DATE OF DISCHARGE:  08/27/2012                              OPERATIVE REPORT   PREOPERATIVE DIAGNOSIS:  Left olecranon bursitis.  POSTOPERATIVE DIAGNOSIS:  Left olecranon bursitis.  PROCEDURE:  Excision of left olecranon bursa.  ANESTHESIA:  Bier block.  Please refer to anesthesia record for tourniquet time.  One Penrose drain, left hand.  SURGEON:  J. Darreld Mclean, M.D.  INDICATIONS:  The patient had a large olecranon bursa for some time appears skin progressively larger.  I have aspirated in the office and it came back.  He wants to have it excised.It has not been infected.  I explained the risk and imponderables of the procedure preoperatively.  Understand and asked appropriate questions, and understands.  I am going to put a Penrose drain and after the procedure, I will see him in the office tomorrow the day after surgery to remove the Penrose drain.  This is an outpatient procedure.  DESCRIPTION OF PROCEDURE:  The patient was seen in the anesthesia holding area, identified a left arm as correct surgical site.  I placed a mark over the bursa on a left elbow.  The patient was brought into the operating room, placed supine and given Bier block anesthesia, was then prepped and draped in usual manner.  A time-out identifying the patient as [name], we are doing left olecranon bursitis excision.  All instrumentation were in proper proposition and everyone knew each other. Skin incision was made.  The bursa was located just under the edge of the skin and a puncture was made into the bursa.  The fluid expressed and then the bursa was carefully meticulously excised from the Skin.  It was very tightly adhered around the edges of the skin.  We  were careful not to make a hole in the skin and removed from the bursa, this was done successfully.  Bursa was somewhat lobulated.  Once the bursa was removed, small stab wound was made.  The Penrose drain was brought in on the inferior aspect.  Wound was then reapproximated using 2-0 plain, 2-0 chromic, and skin staples.  Sterile dressing applied.  Bulky dressing applied and sheet cotton applied loosely.  ACE bandage applied loosely.  The patient tolerated the procedure well, will go to recovery in good condition.  Appropriate analgesia was given for pain.  I will see him in the office tomorrow to remove the drain. If he has any problem tonight he is to come back to the emergency room.          ______________________________ Shela Commons. Darreld Mclean, M.D.     JWK/MEDQ  D:  08/27/2012  T:  08/28/2012  Job:  [id number]

## 2012-11-06 ENCOUNTER — Telehealth: Payer: Self-pay

## 2012-11-06 NOTE — Telephone Encounter (Signed)
PATIENT CALLED WANTING SAMPLES OF CRESTOR AND ZETIA, PUT SAMPLES AT FRONT DESK

## 2012-12-16 ENCOUNTER — Encounter (HOSPITAL_COMMUNITY): Payer: Self-pay | Admitting: Emergency Medicine

## 2012-12-16 ENCOUNTER — Emergency Department (HOSPITAL_COMMUNITY)
Admission: EM | Admit: 2012-12-16 | Discharge: 2012-12-17 | Disposition: A | Discharge status: Home / Self Care | Payer: Medicare PPO | Attending: Emergency Medicine | Admitting: Emergency Medicine

## 2012-12-16 DIAGNOSIS — Z951 Presence of aortocoronary bypass graft: Secondary | ICD-10-CM | POA: Insufficient documentation

## 2012-12-16 DIAGNOSIS — R05 Cough: Secondary | ICD-10-CM | POA: Insufficient documentation

## 2012-12-16 DIAGNOSIS — R0602 Shortness of breath: Secondary | ICD-10-CM | POA: Insufficient documentation

## 2012-12-16 DIAGNOSIS — M549 Dorsalgia, unspecified: Secondary | ICD-10-CM

## 2012-12-16 DIAGNOSIS — I2510 Atherosclerotic heart disease of native coronary artery without angina pectoris: Secondary | ICD-10-CM | POA: Insufficient documentation

## 2012-12-16 DIAGNOSIS — G2581 Restless legs syndrome: Secondary | ICD-10-CM | POA: Insufficient documentation

## 2012-12-16 DIAGNOSIS — Z95818 Presence of other cardiac implants and grafts: Secondary | ICD-10-CM | POA: Insufficient documentation

## 2012-12-16 DIAGNOSIS — Z8719 Personal history of other diseases of the digestive system: Secondary | ICD-10-CM | POA: Insufficient documentation

## 2012-12-16 DIAGNOSIS — F1193 Opioid use, unspecified with withdrawal: Secondary | ICD-10-CM

## 2012-12-16 DIAGNOSIS — R5381 Other malaise: Secondary | ICD-10-CM | POA: Insufficient documentation

## 2012-12-16 DIAGNOSIS — E876 Hypokalemia: Secondary | ICD-10-CM

## 2012-12-16 DIAGNOSIS — E7800 Pure hypercholesterolemia, unspecified: Secondary | ICD-10-CM | POA: Insufficient documentation

## 2012-12-16 DIAGNOSIS — F19939 Other psychoactive substance use, unspecified with withdrawal, unspecified: Secondary | ICD-10-CM | POA: Insufficient documentation

## 2012-12-16 DIAGNOSIS — Z79899 Other long term (current) drug therapy: Secondary | ICD-10-CM | POA: Insufficient documentation

## 2012-12-16 DIAGNOSIS — F1120 Opioid dependence, uncomplicated: Secondary | ICD-10-CM | POA: Insufficient documentation

## 2012-12-16 DIAGNOSIS — Z8711 Personal history of peptic ulcer disease: Secondary | ICD-10-CM | POA: Insufficient documentation

## 2012-12-16 DIAGNOSIS — F17200 Nicotine dependence, unspecified, uncomplicated: Secondary | ICD-10-CM | POA: Insufficient documentation

## 2012-12-16 DIAGNOSIS — R079 Chest pain, unspecified: Secondary | ICD-10-CM | POA: Insufficient documentation

## 2012-12-16 DIAGNOSIS — R197 Diarrhea, unspecified: Secondary | ICD-10-CM | POA: Insufficient documentation

## 2012-12-16 DIAGNOSIS — F1123 Opioid dependence with withdrawal: Secondary | ICD-10-CM

## 2012-12-16 DIAGNOSIS — R112 Nausea with vomiting, unspecified: Secondary | ICD-10-CM | POA: Insufficient documentation

## 2012-12-16 DIAGNOSIS — G8929 Other chronic pain: Secondary | ICD-10-CM | POA: Insufficient documentation

## 2012-12-16 DIAGNOSIS — I10 Essential (primary) hypertension: Secondary | ICD-10-CM | POA: Insufficient documentation

## 2012-12-16 DIAGNOSIS — R059 Cough, unspecified: Secondary | ICD-10-CM | POA: Insufficient documentation

## 2012-12-16 LAB — CBC
HCT: 39.6 % (ref 39.0–52.0)
MCH: 33.7 pg (ref 26.0–34.0)
MCHC: 35.4 g/dL (ref 30.0–36.0)
RDW: 13.1 % (ref 11.5–15.5)

## 2012-12-16 NOTE — ED Notes (Signed)
Pt reporting back pain for several days.  Also reporting some SOB, cough and epigastric chest pain for for several days.

## 2012-12-17 ENCOUNTER — Emergency Department (HOSPITAL_COMMUNITY): Payer: Medicare PPO

## 2012-12-17 ENCOUNTER — Emergency Department (HOSPITAL_COMMUNITY)
Admission: EM | Admit: 2012-12-17 | Discharge: 2012-12-17 | Disposition: A | Discharge status: Home / Self Care | Payer: Medicare PPO | Source: Home / Self Care | Attending: Emergency Medicine | Admitting: Emergency Medicine

## 2012-12-17 ENCOUNTER — Encounter (HOSPITAL_COMMUNITY): Payer: Self-pay | Admitting: Emergency Medicine

## 2012-12-17 DIAGNOSIS — W108XXA Fall (on) (from) other stairs and steps, initial encounter: Secondary | ICD-10-CM | POA: Insufficient documentation

## 2012-12-17 DIAGNOSIS — Z8719 Personal history of other diseases of the digestive system: Secondary | ICD-10-CM | POA: Insufficient documentation

## 2012-12-17 DIAGNOSIS — Z95818 Presence of other cardiac implants and grafts: Secondary | ICD-10-CM | POA: Insufficient documentation

## 2012-12-17 DIAGNOSIS — R197 Diarrhea, unspecified: Secondary | ICD-10-CM | POA: Insufficient documentation

## 2012-12-17 DIAGNOSIS — R05 Cough: Secondary | ICD-10-CM | POA: Insufficient documentation

## 2012-12-17 DIAGNOSIS — G8929 Other chronic pain: Secondary | ICD-10-CM | POA: Insufficient documentation

## 2012-12-17 DIAGNOSIS — R0602 Shortness of breath: Secondary | ICD-10-CM | POA: Insufficient documentation

## 2012-12-17 DIAGNOSIS — Z79899 Other long term (current) drug therapy: Secondary | ICD-10-CM | POA: Insufficient documentation

## 2012-12-17 DIAGNOSIS — Z8711 Personal history of peptic ulcer disease: Secondary | ICD-10-CM | POA: Insufficient documentation

## 2012-12-17 DIAGNOSIS — Z951 Presence of aortocoronary bypass graft: Secondary | ICD-10-CM | POA: Insufficient documentation

## 2012-12-17 DIAGNOSIS — E7800 Pure hypercholesterolemia, unspecified: Secondary | ICD-10-CM | POA: Insufficient documentation

## 2012-12-17 DIAGNOSIS — R109 Unspecified abdominal pain: Secondary | ICD-10-CM | POA: Insufficient documentation

## 2012-12-17 DIAGNOSIS — R51 Headache: Secondary | ICD-10-CM | POA: Insufficient documentation

## 2012-12-17 DIAGNOSIS — IMO0002 Reserved for concepts with insufficient information to code with codable children: Secondary | ICD-10-CM | POA: Insufficient documentation

## 2012-12-17 DIAGNOSIS — I10 Essential (primary) hypertension: Secondary | ICD-10-CM | POA: Insufficient documentation

## 2012-12-17 DIAGNOSIS — Y939 Activity, unspecified: Secondary | ICD-10-CM | POA: Insufficient documentation

## 2012-12-17 DIAGNOSIS — R079 Chest pain, unspecified: Secondary | ICD-10-CM | POA: Insufficient documentation

## 2012-12-17 DIAGNOSIS — I2510 Atherosclerotic heart disease of native coronary artery without angina pectoris: Secondary | ICD-10-CM | POA: Insufficient documentation

## 2012-12-17 DIAGNOSIS — Y929 Unspecified place or not applicable: Secondary | ICD-10-CM | POA: Insufficient documentation

## 2012-12-17 DIAGNOSIS — R059 Cough, unspecified: Secondary | ICD-10-CM | POA: Insufficient documentation

## 2012-12-17 DIAGNOSIS — F17200 Nicotine dependence, unspecified, uncomplicated: Secondary | ICD-10-CM | POA: Insufficient documentation

## 2012-12-17 DIAGNOSIS — R112 Nausea with vomiting, unspecified: Secondary | ICD-10-CM | POA: Insufficient documentation

## 2012-12-17 LAB — BASIC METABOLIC PANEL
BUN: 7 mg/dL (ref 6–23)
Calcium: 9.2 mg/dL (ref 8.4–10.5)
Creatinine, Ser: 0.76 mg/dL (ref 0.50–1.35)
GFR calc Af Amer: >90 mL/min (ref >90)
GFR calc non Af Amer: >90 mL/min (ref >90)
Potassium: 2.7 mEq/L — CL (ref 3.5–5.1)

## 2012-12-17 MED ORDER — POTASSIUM CHLORIDE CRYS ER 20 MEQ PO TBCR
40.0000 meq | EXTENDED_RELEASE_TABLET | Freq: Once | ORAL | Status: AC
  Administered 2012-12-17: 40 meq via ORAL
  Filled 2012-12-17: qty 2

## 2012-12-17 MED ORDER — DIAZEPAM 5 MG/ML IJ SOLN
10.0000 mg | Freq: Once | INTRAMUSCULAR | Status: AC
  Administered 2012-12-17: 10 mg via INTRAMUSCULAR
  Filled 2012-12-17: qty 2

## 2012-12-17 MED ORDER — ONDANSETRON HCL 4 MG/2ML IJ SOLN
4.0000 mg | Freq: Once | INTRAMUSCULAR | Status: AC
  Administered 2012-12-17: 4 mg via INTRAVENOUS
  Filled 2012-12-17: qty 2

## 2012-12-17 MED ORDER — FENTANYL CITRATE 0.05 MG/ML IJ SOLN
50.0000 ug | Freq: Once | INTRAMUSCULAR | Status: AC
  Administered 2012-12-17: 50 ug via INTRAVENOUS
  Filled 2012-12-17: qty 2

## 2012-12-17 MED ORDER — SODIUM CHLORIDE 0.9 % IV BOLUS (SEPSIS)
500.0000 mL | Freq: Once | INTRAVENOUS | Status: AC
  Administered 2012-12-17: 500 mL via INTRAVENOUS

## 2012-12-17 MED ORDER — IOHEXOL 350 MG/ML SOLN
100.0000 mL | Freq: Once | INTRAVENOUS | Status: AC | PRN
  Administered 2012-12-17: 100 mL via INTRAVENOUS

## 2012-12-17 MED ORDER — FENTANYL 50 MCG/HR TD PT72
50.0000 ug | MEDICATED_PATCH | TRANSDERMAL | Status: DC
  Administered 2012-12-17: 50 ug via TRANSDERMAL
  Filled 2012-12-17: qty 1

## 2012-12-17 MED ORDER — KETOROLAC TROMETHAMINE 60 MG/2ML IM SOLN
60.0000 mg | Freq: Once | INTRAMUSCULAR | Status: AC
  Administered 2012-12-17: 60 mg via INTRAMUSCULAR
  Filled 2012-12-17: qty 2

## 2012-12-17 MED ORDER — OXYCODONE-ACETAMINOPHEN 5-325 MG PO TABS: 1.0000 | ORAL_TABLET | ORAL | Status: DC | PRN

## 2012-12-17 MED ORDER — HYDROMORPHONE HCL PF 1 MG/ML IJ SOLN
1.0000 mg | Freq: Once | INTRAMUSCULAR | Status: AC
  Administered 2012-12-17: 1 mg via INTRAVENOUS

## 2012-12-17 NOTE — ED Provider Notes (Signed)
CSN: [id number]     Arrival date & time 12/17/12  2016 History  This chart was scribed for Ward Givens, MD by Shari Heritage, ED Scribe. The patient was seen in room APA15/APA15. Patient's care was started at 9:06 PM.      Chief Complaint  Patient presents with  . Back Pain    The history is provided by the patient. No language interpreter was used.    HPI Comments: [name] is a 62 y.o. male with significant history of aortic dissection and coronary artery disease presents to the Emergency Department complaining of worsening, constant severe back pain onset 1-2 days ago. He states that pain has worsened since this morning at about 8:15 AM when he fell down the stairs at home - he says that his cat broke his fall. This fall happened about 1 1/2 hours after leaving the ED this am. He states that he fell because he has been having balance problems because of his chronic back pain and his has not been ambulating normally. He is also complaining of constant chest pain over the xiphoid process onset this morning. He states that he has had this chest pain before which was attributed to "chronic angina". He is currently on nitroglycerin daily for maintenance and he states that he took 2 when chest pain started but this did not provide relief. He further complains of shortness of breath that began this morning, as well as headache. He states that he has had a cough for the past month that has worsened over the past few days. Patient also says that he has had vomiting with 3 episodes on Tuesday, 2 episodes yesterday, and none today. He denies diarrhea. He states that he usually wears 1 Fentanyl patch at a time and also takes Percocet to manage his chronic pain. He tells me today that he hasn't been taking his Percocet everyday and struggles to keep track of his medications. He says that he saw Dr. Channing Mutters yesterday as well and discussed his back pain - he received new prescriptions for his pain medicines. He states  that he has run out of his Fentanyl patch and was unable to get a new refill due to restricts on pain medicine dispensing. He has not spoken to Dr. Channing Mutters today about his worsening pain. A query of his prescription refill history shows that he is getting his Percocet prescription filled every month.   Previous Chart Review Per medical records, patient was discharged from the ED this morning after presenting with worsening back pain for the last 3-4 days after a fall. At the time he was also complaining of nausea, vomiting and diarrhea. He further reported mild shortness of breath, but denies any chest pain or leg swelling. His CXR, CT angio chest, and CT angio abdomen/pelvis were all negative for new acute disease and were stable compared to prior studies. Patient had depressed potassium in the ED and was given replacement orally. His pain was controlled in the ED and he was advised to follow up with his PCP for regular control.  PCP - Danise Edge Pain is managed by Dr. Channing Mutters   Past Medical History  Diagnosis Date  . Cardiac tamponade   . Aortic dissection     TYPE 3  . HTN (hypertension)   . CAD (coronary artery disease)   . Hypercholesterolemia   . Chronic back pain   . Hiatal hernia   . Schatzki's ring   . Chronic fatigue   .  Chronic pain   . Peptic ulcer disease 08/2010    EGD    Past Surgical History  Procedure Laterality Date  . Sternal wire removeal  12/31/2007    [facility]  . Cabg x 6  07/31/2007    [facility]  . Cardiac catheterization    . Knee surgery Right     acl  . Shoulder surgery Left   . Orif scapular fracture Right   . Back surgery    . Olecranon bursectomy Left 08/27/2012    Procedure: EXCISION LEFT OLECRANON BURSA;  Surgeon: Darreld Mclean, MD;  Location: [facility];  Service: Orthopedics;  Laterality: Left;   Family History  Problem Relation Age of Onset  . Heart disease Mother   . Heart disease Father    History  Substance Use Topics  . Smoking status:  Current Some Day Smoker -- 0.50 packs/day for 50 years    Types: Cigarettes  . Smokeless tobacco: Never Used  . Alcohol Use: No    Review of Systems  Respiratory: Positive for cough and shortness of breath.   Cardiovascular: Positive for chest pain.  Gastrointestinal: Positive for vomiting and abdominal pain. Negative for diarrhea.  Musculoskeletal: Positive for back pain.  Neurological: Positive for headaches.  All other systems reviewed and are negative.    Allergies  Review of patient's allergies indicates no known allergies.  Home Medications   Current Outpatient Rx  Name  Route  Sig  Dispense  Refill  . ezetimibe (ZETIA) 10 MG tablet   Oral   Take 10 mg by mouth daily.         Marland Kitchen HYDROcodone-acetaminophen (NORCO) 10-325 MG per tablet   Oral   Take 1 tablet by mouth every 6 (six) hours as needed. pain         . isosorbide mononitrate (IMDUR) 60 MG 24 hr tablet   Oral   Take 60 mg by mouth 2 (two) times daily.         . metoprolol tartrate (LOPRESSOR) 25 MG tablet   Oral   Take 25 mg by mouth 2 (two) times daily.           . Omega-3 Fatty Acids (FISH OIL) 1000 MG CAPS   Oral   Take 1 capsule by mouth daily.         Marland Kitchen oxyCODONE-acetaminophen (PERCOCET) 5-325 MG per tablet   Oral   Take 1 tablet by mouth every 4 (four) hours as needed for moderate pain.   6 tablet   0   . pantoprazole (PROTONIX) 40 MG tablet   Oral   Take 40 mg by mouth 2 (two) times daily.          . rosuvastatin (CRESTOR) 20 MG tablet   Oral   Take 20 mg by mouth daily.         . sertraline (ZOLOFT) 50 MG tablet   Oral   Take 50 mg by mouth 2 (two) times daily.         . fentaNYL (DURAGESIC - DOSED MCG/HR) 50 MCG/HR   Transdermal   Place 1 patch onto the skin every 3 (three) days.          . nitroGLYCERIN (NITROSTAT) 0.4 MG SL tablet   Sublingual   Place 0.4 mg under the tongue every 5 (five) minutes as needed. Chest pain         . Oxycodone HCl 20 MG TABS    Oral   Take 20 mg by mouth  every 6 (six) hours as needed. pain          Triage Vitals: BP 141/100  Pulse 100  Temp(Src) 98 F (36.7 C) (Oral)  Resp 22  Ht 5\' 10"  (1.778 m)  Wt 160 lb (72.576 kg)  BMI 22.96 kg/m2  SpO2 100%  Vital signs normal except tachycardia  Physical Exam  Nursing note and vitals reviewed. Constitutional: He is oriented to person, place, and time. He appears well-developed and well-nourished.  Non-toxic appearance. He does not appear ill. No distress.  HENT:  Head: Normocephalic and atraumatic.  Right Ear: External ear normal.  Left Ear: External ear normal.  Nose: Nose normal. No mucosal edema or rhinorrhea.  Mouth/Throat: Oropharynx is clear and moist and mucous membranes are normal. No dental abscesses or uvula swelling.  Eyes: Conjunctivae and EOM are normal. Pupils are equal, round, and reactive to light.  Neck: Normal range of motion and full passive range of motion without pain. Neck supple.  Cardiovascular: Normal rate, regular rhythm and normal heart sounds.  Exam reveals no gallop and no friction rub.   No murmur heard. Pulmonary/Chest: Effort normal and breath sounds normal. No respiratory distress. He has no wheezes. He has no rhonchi. He has no rales. He exhibits no tenderness and no crepitus.  Abdominal: Soft. Normal appearance and bowel sounds are normal. He exhibits no distension. There is no tenderness. There is no rebound and no guarding.  Musculoskeletal: Normal range of motion. He exhibits no edema and no tenderness.  Moves all extremities well. Diffuse tenderness of his lower lumbar spine, no signs of trauma.   Neurological: He is alert and oriented to person, place, and time. He has normal strength. No cranial nerve deficit.  Skin: Skin is warm, dry and intact. No rash noted. No erythema. No pallor.  Pt has no bruising, abrasions  Psychiatric: He has a normal mood and affect. His speech is normal and behavior is normal. His mood appears  not anxious.    ED Course  Procedures (including critical care time)  Medications  fentaNYL (DURAGESIC - dosed mcg/hr) 50 mcg (50 mcg Transdermal Patch Applied 12/17/12 2156)  diazepam (VALIUM) injection 10 mg (10 mg Intramuscular Given 12/17/12 2153)  ketorolac (TORADOL) injection 60 mg (60 mg Intramuscular Given 12/17/12 2151)    DIAGNOSTIC STUDIES: Oxygen Saturation is 100% on room air, normal by my interpretation.    COORDINATION OF CARE: 9:24 PM- Multiple imaging studies including CXR, CT Angio chest and CT angio abdomen/pelvis completed this morning at patient's previous ED visit which were all negative for acute disease. Will give Valium and Toradol in the ED to control pain, as well as give a Fentanyl patch. Patient informed of current plan for treatment and evaluation and agrees with plan at this time.   Basically patient is here because he has run out of his pain medications, including the prepack he was given this morning. All of his pain is chronic. He c/o cough for a month but he had a CXR and CT angio chest yesterday and is without pneumonia.   Pt has prescriptions written from Dr Channing Mutters for percocet and fentanyl patches written yesterday, states he saw Dr Channing Mutters, yesterday but the pharmacy will not fill his prescriptions because it has been less than 30 days. Pt states he ran out of his fentanyl patches early because two patches wouldn't stick to his skin well.   Patient states he doesn't take his pain medication, specifically Percocet on a chronic basis however  on review of the [place] data base patient gets #180 oxycodone 20 mg tablets around the 17th to 21st of each month since the six-month review in June. They were last filled on November 6. He also gets fentanyl patches #1050 mcg per hour patches monthly, the last was November 9. He also received #120 hydrocodone 10/325 cm November 21. Patient states he doesn't take them because they make him nauseated. I offered to give  patient nausea medicine however he does not want to take them.  Pt ambulatory in NAD on discharge.   Labs Review Labs Reviewed - No data to display  Imaging Review  Dg Chest [facility]  12/17/2012 .  IMPRESSION: No evidence of active pulmonary disease. Ectatic and tortuous aorta is stable. Emphysematous changes.   Electronically Signed   By: Burman Nieves M.D.   On: 12/17/2012 00:28   Ct Angio Chest Pe W/cm &/or Wo Cm  Ct Angio Abd/pel W/ And/or W/o  12/17/2012   : CTA CHEST FINDINGS  Unenhanced images demonstrate postoperative changes in the mediastinum. Coronary artery bypass grafts with Coronary artery calcification. No evidence of intramural hematoma.  Normal caliber of the ascending thoracic aorta. There is a dissection beginning in the distal aortic arch just past the origin of the left subclavian artery. There is flow in both the true and false lumens. The false lumen is larger than the true lumen. The aneurysm extends down to the upper abdominal aorta and in the false lumen appears to terminate. There is no evidence of extravasation or mediastinal hematoma. No significant lymphadenopathy in the chest. The lungs are clear. No pneumothorax. No pleural effusion or thickening. Degenerative changes and hemangiomas in the thoracic spine.  Review of the MIP images confirms the above findings.  CTA ABDOMEN AND PELVIS FINDINGS  .  IMPRESSION: Thoracic aortic dissection beginning just past the origin of the left subclavian artery and extending to the upper abdominal aorta. Stable appearance since previous study. No evidence of rupture.  Abdominal aorta and branch vessels appear patent. Renal nephrograms are symmetrical. No evidence of rupture of the abdominal aorta. Stable appearance since previous study.   Electronically Signed   By: Burman Nieves M.D.   On: 12/17/2012 03:26    EKG Interpretation   None       MDM   1. Chronic back pain     Plan discharge   Devoria Albe, MD,  FACEP    I personally performed the services described in this documentation, which was scribed in my presence. The recorded information has been reviewed and considered.  Devoria Albe, MD, Armando Gang    Ward Givens, MD 12/17/12 2231

## 2012-12-17 NOTE — ED Notes (Signed)
Patient walked to restroom. Put patient back on monitor.

## 2012-12-17 NOTE — ED Notes (Signed)
Chronic pain - Here 12/16/2012.  States he has taken NTG today without relief for pain in center of chest.  C/o shortness of breath.

## 2012-12-17 NOTE — ED Notes (Addendum)
CRITICAL VALUE ALERT  Critical value received:  K+ 2.7  Date of notification: 12/17/2012  Time of notification:  0030  Critical value read back: Yes  Nurse who received alert:  Tiana Loft, RN  MD notified Yes. Dr Ranae Palms informed

## 2012-12-17 NOTE — ED Notes (Addendum)
Pt reporting continued back and leg pain.  Pt was discharged this morning with Percocet prepack.  Pt does have prescription for Fentanyl patches and Oxycodone 20 mg written on 11/26 in his possession.  Pt states he did try to fill fentanyl yesterday, but was too soon to fill it.  Pt also states "the other should be okay to fill".

## 2012-12-17 NOTE — ED Notes (Signed)
Cough and dry heaves, staggering gait, states he fell down 11 steps this am.  Speech clear.

## 2012-12-17 NOTE — ED Provider Notes (Signed)
CSN: [id number]     Arrival date & time 12/16/12  2321 History   First MD Initiated Contact with Patient 12/16/12 2353     Chief Complaint  Patient presents with  . Back Pain  . Chest Pain  . Shortness of Breath   (Consider location/radiation/quality/duration/timing/severity/associated sxs/prior Treatment) HPI Patient with a history of chronic back pain who is on fentanyl patches and oxycodone presents with increasing back pain for the last 3-4 days after failing to fill his normal patch prescription. He's also been having nausea and vomiting with diarrhea. States he also has been having "restless legs" and that is the primary reason he came in this evening. He has mild chronic shortness of breath and denies chest pain to me. He has no lower extremity swelling or calf pain. Patient has significant cardiac history including coronary artery disease and history of aortic dissection. Past Medical History  Diagnosis Date  . Cardiac tamponade   . Aortic dissection     TYPE 3  . HTN (hypertension)   . CAD (coronary artery disease)   . Hypercholesterolemia   . Chronic back pain   . Hiatal hernia   . Schatzki's ring   . Chronic fatigue   . Chronic pain   . Peptic ulcer disease 08/2010    EGD    Past Surgical History  Procedure Laterality Date  . Sternal wire removeal  12/31/2007    [facility]  . Cabg x 6  07/31/2007    HENDRICKSON  . Cardiac catheterization    . Knee surgery Right     acl  . Shoulder surgery Left   . Orif scapular fracture Right   . Back surgery    . Olecranon bursectomy Left 08/27/2012    Procedure: EXCISION LEFT OLECRANON BURSA;  Surgeon: Darreld Mclean, MD;  Location: [facility];  Service: Orthopedics;  Laterality: Left;   Family History  Problem Relation Age of Onset  . Heart disease Mother   . Heart disease Father    History  Substance Use Topics  . Smoking status: Current Some Day Smoker -- 0.50 packs/day for 50 years    Types: Cigarettes  . Smokeless  tobacco: Never Used  . Alcohol Use: No    Review of Systems  Constitutional: Positive for fatigue. Negative for fever and chills.  Respiratory: Positive for cough and shortness of breath.   Cardiovascular: Negative for chest pain, palpitations and leg swelling.  Gastrointestinal: Positive for nausea, vomiting and diarrhea. Negative for abdominal pain and constipation.  Genitourinary: Negative for dysuria, hematuria and flank pain.  Musculoskeletal: Positive for back pain and myalgias. Negative for neck pain and neck stiffness.  Skin: Negative for rash.  Neurological: Positive for weakness (generalized). Negative for dizziness, light-headedness, numbness and headaches.  All other systems reviewed and are negative.    Allergies  Review of patient's allergies indicates no known allergies.  Home Medications   Current Outpatient Rx  Name  Route  Sig  Dispense  Refill  . ezetimibe (ZETIA) 10 MG tablet   Oral   Take 10 mg by mouth daily.         . fentaNYL (DURAGESIC - DOSED MCG/HR) 50 MCG/HR   Transdermal   Place 1 patch onto the skin every 3 (three) days.          . isosorbide mononitrate (IMDUR) 60 MG 24 hr tablet   Oral   Take 60 mg by mouth 2 (two) times daily.         Marland Kitchen  metoprolol tartrate (LOPRESSOR) 25 MG tablet   Oral   Take 25 mg by mouth 2 (two) times daily.           . nitroGLYCERIN (NITROSTAT) 0.4 MG SL tablet   Sublingual   Place 0.4 mg under the tongue every 5 (five) minutes as needed. Chest pain         . Omega-3 Fatty Acids (FISH OIL) 1000 MG CAPS   Oral   Take 1 capsule by mouth daily.         Marland Kitchen oxyCODONE-acetaminophen (PERCOCET) 10-325 MG per tablet   Oral   Take 1 tablet by mouth every 6 (six) hours as needed for pain.          . pantoprazole (PROTONIX) 40 MG tablet   Oral   Take 40 mg by mouth 2 (two) times daily.          . rosuvastatin (CRESTOR) 20 MG tablet   Oral   Take 20 mg by mouth daily.         . sertraline (ZOLOFT)  50 MG tablet   Oral   Take 50 mg by mouth 2 (two) times daily.          BP 129/78  Pulse 111  Temp(Src) 98.5 F (36.9 C) (Oral)  Resp 18  Ht 5\' 9"  (1.753 m)  Wt 160 lb (72.576 kg)  BMI 23.62 kg/m2  SpO2 100% Physical Exam  Nursing note and vitals reviewed. Constitutional: He is oriented to person, place, and time. He appears well-developed and well-nourished. No distress.  HENT:  Head: Normocephalic and atraumatic.  Mouth/Throat: Oropharynx is clear and moist.  Eyes: EOM are normal. Pupils are equal, round, and reactive to light.  Neck: Normal range of motion. Neck supple.  Cardiovascular: Normal rate and regular rhythm.   Pulmonary/Chest: Effort normal and breath sounds normal. No respiratory distress. He has no wheezes. He has no rales. He exhibits no tenderness.  Abdominal: Soft. Bowel sounds are normal. He exhibits no distension and no mass. There is no tenderness. There is no rebound and no guarding.  Musculoskeletal: Normal range of motion. He exhibits tenderness (tenderness to palpation over the right-sided rhomboid muscles and paraspinal lumbar muscles without midline tenderness.). He exhibits no edema.  Neurological: He is alert and oriented to person, place, and time.  Patient is alert and oriented x3 with clear, goal oriented speech. Patient has 5/5 motor in all extremities. Sensation is intact to light touch. Patient has a normal gait and walks without assistance.   Skin: Skin is warm and dry. No rash noted. No erythema.  Psychiatric: He has a normal mood and affect. His behavior is normal.    ED Course  Procedures (including critical care time) Labs Review Labs Reviewed  CBC - Abnormal; Notable for the following:    RBC 4.16 (*)    All other components within normal limits  BASIC METABOLIC PANEL - Abnormal; Notable for the following:    Potassium 2.7 (*)    Glucose, Bld 167 (*)    All other components within normal limits  PRO B NATRIURETIC PEPTIDE -  Abnormal; Notable for the following:    Pro B Natriuretic peptide (BNP) 1488.0 (*)    All other components within normal limits  TROPONIN I   Imaging Review Dg Chest Port 1 View  12/17/2012   CLINICAL DATA:  Chest pain, upper back pain, and shortness of breath tonight.  EXAM: PORTABLE CHEST - 1 VIEW  COMPARISON:  06/15/2012  FINDINGS: Normal heart size and pulmonary vascularity. Postoperative changes in the mediastinum. Tortuous and ectatic aorta. Emphysematous changes in the lungs. No focal consolidation. No pneumothorax. Old resection or resorption of the distal clavicles. Stable appearance since previous study.  IMPRESSION: No evidence of active pulmonary disease. Ectatic and tortuous aorta is stable. Emphysematous changes.   Electronically Signed   By: Burman Nieves M.D.   On: 12/17/2012 00:28    EKG Interpretation    Date/Time:  Wednesday December 16 2012 23:47:02 EST Ventricular Rate:  77 PR Interval:  162 QRS Duration: 86 QT Interval:  394 QTC Calculation: 445 R Axis:   79 Text Interpretation:  Normal sinus rhythm Nonspecific T wave abnormality Abnormal ECG When compared with ECG of 25-Aug-2012 09:00, T wave inversion more evident in Anterolateral leads Confirmed by Ranae Palms  MD, Pedrohenrique Mcconville ([id number]) on 12/17/2012 12:00:27 AM            MDM   Patient states he is feeling better. His back pain is resolved. He continues to deny chest pain. Replace his potassium orally. Patient is advised to followup with his primary Dr. Return precautions have been given patient voiced understanding.  Loren Racer, MD 12/17/12 [phone]

## 2012-12-21 MED FILL — Oxycodone w/ Acetaminophen Tab 5-325 MG: ORAL | Qty: 6 | Status: AC

## 2012-12-25 ENCOUNTER — Telehealth: Payer: Self-pay | Admitting: Cardiology

## 2012-12-25 NOTE — Telephone Encounter (Signed)
New message     Refill---metoprolol 25mg  --90da supply----------[facility] [phone]----out of medication for 2wks---pharmacy says they have faxed info to [place] for weeks.---Pt has not been seen at [address] office

## 2012-12-28 ENCOUNTER — Other Ambulatory Visit: Payer: Self-pay

## 2012-12-28 MED ORDER — METOPROLOL TARTRATE 25 MG PO TABS: 25.0000 mg | ORAL_TABLET | Freq: Two times a day (BID) | ORAL | Status: DC

## 2012-12-28 NOTE — Telephone Encounter (Signed)
Medication filled, closing telephone note.

## 2013-02-21 ENCOUNTER — Other Ambulatory Visit: Payer: Self-pay | Admitting: *Deleted

## 2013-02-21 DIAGNOSIS — Z79899 Other long term (current) drug therapy: Secondary | ICD-10-CM

## 2013-02-21 DIAGNOSIS — I2510 Atherosclerotic heart disease of native coronary artery without angina pectoris: Secondary | ICD-10-CM

## 2013-03-11 ENCOUNTER — Telehealth: Payer: Self-pay | Admitting: Cardiology

## 2013-03-11 ENCOUNTER — Ambulatory Visit
Admission: RE | Admit: 2013-03-11 | Discharge: 2013-03-11 | Disposition: A | Discharge status: Home / Self Care | Payer: Medicare PPO | Source: Ambulatory Visit | Attending: Gastroenterology | Admitting: Gastroenterology

## 2013-03-11 ENCOUNTER — Other Ambulatory Visit: Payer: Self-pay | Admitting: Gastroenterology

## 2013-03-11 DIAGNOSIS — R059 Cough, unspecified: Secondary | ICD-10-CM

## 2013-03-11 DIAGNOSIS — R05 Cough: Secondary | ICD-10-CM

## 2013-03-11 NOTE — Telephone Encounter (Signed)
New message     Want samples of zetia and crestor.  Will pick up at his next appt.  Pt have not seen Dr Marlou Porch at new office

## 2013-03-11 NOTE — Telephone Encounter (Signed)
Returned call to patient samples of crestor 20 mg left at front desk 3rd floor.Office out of zetia samples.

## 2013-03-30 ENCOUNTER — Encounter: Payer: Self-pay | Admitting: Cardiology

## 2013-04-05 ENCOUNTER — Encounter: Payer: Self-pay | Admitting: Cardiology

## 2013-04-05 ENCOUNTER — Ambulatory Visit (INDEPENDENT_AMBULATORY_CARE_PROVIDER_SITE_OTHER): Payer: Medicare PPO | Admitting: Cardiology

## 2013-04-05 ENCOUNTER — Other Ambulatory Visit: Payer: Medicare PPO

## 2013-04-05 VITALS — BP 117/66 | HR 59 | Ht 70.0 in | Wt 149.0 lb

## 2013-04-05 DIAGNOSIS — F329 Major depressive disorder, single episode, unspecified: Secondary | ICD-10-CM

## 2013-04-05 DIAGNOSIS — M549 Dorsalgia, unspecified: Secondary | ICD-10-CM

## 2013-04-05 DIAGNOSIS — J449 Chronic obstructive pulmonary disease, unspecified: Secondary | ICD-10-CM

## 2013-04-05 DIAGNOSIS — I2510 Atherosclerotic heart disease of native coronary artery without angina pectoris: Secondary | ICD-10-CM

## 2013-04-05 DIAGNOSIS — F3289 Other specified depressive episodes: Secondary | ICD-10-CM

## 2013-04-05 DIAGNOSIS — G8929 Other chronic pain: Secondary | ICD-10-CM

## 2013-04-05 DIAGNOSIS — I10 Essential (primary) hypertension: Secondary | ICD-10-CM

## 2013-04-05 DIAGNOSIS — F32A Depression, unspecified: Secondary | ICD-10-CM | POA: Insufficient documentation

## 2013-04-05 DIAGNOSIS — I7100 Dissection of unspecified site of aorta: Secondary | ICD-10-CM

## 2013-04-05 NOTE — Patient Instructions (Signed)
Your physician recommends that you continue on your current medications as directed. Please refer to the Current Medication list given to you today.  Your physician wants you to follow-up in: 6 months with Dr. Skains. You will receive a reminder letter in the mail two months in advance. If you don't receive a letter, please call our office to schedule the follow-up appointment.  

## 2013-04-05 NOTE — Progress Notes (Signed)
[place]. [address], [address], [place]  [address] Phone: [phone] Fax:  [phone]  Date:  04/05/2013   ID:  [name], DOB 1950-08-31, MRN [id number]  PCP:  [name], MD   History of Present Illness: [name] is a 63 y.o. male Has type III aortic dissection, Dr. Roxan Hockey, nonsurgical candidate. In patient's words, it is amazing that he is still here. Non-ST elevation myocardial infarction 7/09, multivessel disease.-Bypass surgery. Subsequent catheterization on 03/18/08 demonstrated 2 out of the 4 grafts occluded. Not amenable to treatment. Medical management. He also had a descending aorta, aortic dissection 9/08. COPD, bronchitis. Can't sleep. Back pain.   He currently is on isosorbide. Metoprolol. Chest "hurts 24/7" On fire to back. On a fentanyl patch. Dr. Carloyn Manner. He seems quite depressed again. His blood pressure has been elevated at home. But at times BP is quite low (? patch).  Has been to ER several times. Each time CT scan. Reassuring.   He continues to lose weight    Wt Readings from Last 3 Encounters:  04/05/13 149 lb (67.586 kg)  12/17/12 160 lb (72.576 kg)  12/16/12 160 lb (72.576 kg)     Past Medical History  Diagnosis Date  . Cardiac tamponade   . Aortic dissection     TYPE 3  . HTN (hypertension)   . CAD (coronary artery disease)   . Hypercholesterolemia   . Chronic back pain   . Hiatal hernia   . Schatzki's ring   . Chronic fatigue   . Chronic pain   . Peptic ulcer disease 08/2010    EGD     Past Surgical History  Procedure Laterality Date  . Sternal wire removeal  12/31/2007    [name]  . Cabg x 6  07/31/2007    [name]  . Cardiac catheterization    . Knee surgery Right     acl  . Shoulder surgery Left   . Orif scapular fracture Right   . Back surgery    . Olecranon bursectomy Left 08/27/2012    Procedure: EXCISION LEFT OLECRANON BURSA;  Surgeon: Sanjuana Kava, MD;  Location: [facility];  Service: Orthopedics;   Laterality: Left;    Current Outpatient Prescriptions  Medication Sig Dispense Refill  . ezetimibe (ZETIA) 10 MG tablet Take 10 mg by mouth daily.      . fentaNYL (DURAGESIC - DOSED MCG/HR) 50 MCG/HR Place 1 patch onto the skin every 3 (three) days.       . isosorbide mononitrate (IMDUR) 60 MG 24 hr tablet Take 60 mg by mouth 2 (two) times daily.      . metoprolol tartrate (LOPRESSOR) 25 MG tablet Take 1 tablet (25 mg total) by mouth 2 (two) times daily.  180 tablet  2  . nitroGLYCERIN (NITROSTAT) 0.4 MG SL tablet Place 0.4 mg under the tongue every 5 (five) minutes as needed. Chest pain      . Omega-3 Fatty Acids (FISH OIL) 1000 MG CAPS Take 1 capsule by mouth daily.      . Oxycodone HCl 20 MG TABS Take 20 mg by mouth every 6 (six) hours as needed. pain      . pantoprazole (PROTONIX) 40 MG tablet Take 40 mg by mouth 2 (two) times daily.       . rosuvastatin (CRESTOR) 20 MG tablet Take 20 mg by mouth daily.      . sertraline (ZOLOFT) 50 MG tablet Take 50 mg by mouth  2 (two) times daily.       No current facility-administered medications for this visit.    Allergies:   No Known Allergies  Social History:  The patient  reports that he has been smoking Cigarettes.  He has a 25 pack-year smoking history. He has never used smokeless tobacco. He reports that he does not drink alcohol or use illicit drugs.   ROS:  Please see the history of present illness.   Positive lethargy, depression. No chest pain. Positive cough  PHYSICAL EXAM: VS:  BP 117/66  Pulse 59  Ht 5\' 10"  (1.778 m)  Wt 149 lb (67.586 kg)  BMI 21.38 kg/m2 Well nourished, well developed, in no acute distress HEENT: normal Neck: no JVD Cardiac:  normal S1, S2; RRR; no murmur Lungs:  clear to auscultation bilaterally, no wheezing, rhonchi or rales, mildly decreased breath sounds bilaterally Abd: soft, nontender, no hepatomegaly Ext: no edema Skin: warm and dry Neuro: no focal abnormalities noted  EKG:  None today Labs:  Prior hypokalemia-repleted  ASSESSMENT AND PLAN:  1. Depression-asked him to discuss further with Dr. Wynetta Emery. Continue with SSRI. 2. Aortic dissection-no further discomfort. Stable. Notes reviewed. Continue with blood pressure control. 3. COPD/chronic bronchitis-continue with current care. Breathing treatments. 4. Tobacco use-cessation encouraged. 5. Back pain-chronic.  Signed, Candee Furbish, MD [facility]  04/05/2013 4:24 PM

## 2013-04-08 ENCOUNTER — Other Ambulatory Visit: Payer: Medicare PPO

## 2013-04-18 ENCOUNTER — Emergency Department (HOSPITAL_COMMUNITY)
Admission: EM | Admit: 2013-04-18 | Discharge: 2013-04-19 | Disposition: A | Discharge status: Home / Self Care | Payer: Medicare PPO | Attending: Emergency Medicine | Admitting: Emergency Medicine

## 2013-04-18 ENCOUNTER — Encounter (HOSPITAL_COMMUNITY): Payer: Self-pay | Admitting: Emergency Medicine

## 2013-04-18 DIAGNOSIS — G8929 Other chronic pain: Secondary | ICD-10-CM | POA: Insufficient documentation

## 2013-04-18 DIAGNOSIS — Z9889 Other specified postprocedural states: Secondary | ICD-10-CM | POA: Insufficient documentation

## 2013-04-18 DIAGNOSIS — F17200 Nicotine dependence, unspecified, uncomplicated: Secondary | ICD-10-CM | POA: Insufficient documentation

## 2013-04-18 DIAGNOSIS — I10 Essential (primary) hypertension: Secondary | ICD-10-CM | POA: Insufficient documentation

## 2013-04-18 DIAGNOSIS — K279 Peptic ulcer, site unspecified, unspecified as acute or chronic, without hemorrhage or perforation: Secondary | ICD-10-CM | POA: Insufficient documentation

## 2013-04-18 DIAGNOSIS — E785 Hyperlipidemia, unspecified: Secondary | ICD-10-CM | POA: Insufficient documentation

## 2013-04-18 DIAGNOSIS — Z79899 Other long term (current) drug therapy: Secondary | ICD-10-CM | POA: Insufficient documentation

## 2013-04-18 DIAGNOSIS — I2510 Atherosclerotic heart disease of native coronary artery without angina pectoris: Secondary | ICD-10-CM | POA: Insufficient documentation

## 2013-04-18 NOTE — ED Notes (Signed)
Patient states he used his last Fentanyl patch yesterday and has been having a hard time getting them to stick and stay on his skin.

## 2013-04-19 ENCOUNTER — Other Ambulatory Visit: Payer: Self-pay

## 2013-04-19 ENCOUNTER — Telehealth: Payer: Self-pay | Admitting: Cardiology

## 2013-04-19 MED ORDER — FENTANYL 50 MCG/HR TD PT72
50.0000 ug | MEDICATED_PATCH | TRANSDERMAL | Status: DC
  Administered 2013-04-19: 50 ug via TRANSDERMAL
  Filled 2013-04-19: qty 1

## 2013-04-19 NOTE — Telephone Encounter (Signed)
Walk In pt Form " [employer]" Forms Dropped Off [place]/Sk[place] back 3.31

## 2013-04-19 NOTE — ED Provider Notes (Addendum)
CSN: [id number]     Arrival date & time 04/18/13  2348 History  This chart was scribed for Delora Fuel, MD by Maree Erie, ED Scribe. The patient was seen in room APA16A/APA16A. Patient's care was started at 12:17 AM.      Chief Complaint  Patient presents with  . Medication Refill     The history is provided by the patient. No language interpreter was used.    HPI Comments: [name] is a 63 y.o. male with a history of chronic pain who presents to the Emergency Department for a medication refill. He states that his current Fentanyl patch fell off and he cannot get it to re-stick on his skin. He has a refill on the patch in two days but is requesting a patch in the ED to keep him from going in to withdrawal until his prescription becomes available. He states he has been on the medication for years. He reports smoking a pack every four days.      Past Medical History  Diagnosis Date  . Cardiac tamponade   . Aortic dissection     TYPE 3  . HTN (hypertension)   . CAD (coronary artery disease)   . Hypercholesterolemia   . Chronic back pain   . Hiatal hernia   . Schatzki's ring   . Chronic fatigue   . Chronic pain   . Peptic ulcer disease 08/2010    EGD    Past Surgical History  Procedure Laterality Date  . Sternal wire removeal  12/31/2007    HENDRICKSON  . Cabg x 6  07/31/2007    HENDRICKSON  . Cardiac catheterization    . Knee surgery Right     acl  . Shoulder surgery Left   . Orif scapular fracture Right   . Back surgery    . Olecranon bursectomy Left 08/27/2012    Procedure: EXCISION LEFT OLECRANON BURSA;  Surgeon: Sanjuana Kava, MD;  Location: [facility];  Service: Orthopedics;  Laterality: Left;   Family History  Problem Relation Age of Onset  . Heart disease Mother   . Heart disease Father    History  Substance Use Topics  . Smoking status: Current Some Day Smoker -- 0.50 packs/day for 50 years    Types: Cigarettes  . Smokeless tobacco: Never Used  . Alcohol  Use: No    Review of Systems  All other systems reviewed and are negative.      Allergies  Review of patient's allergies indicates no known allergies.  Home Medications   Current Outpatient Rx  Name  Route  Sig  Dispense  Refill  . ezetimibe (ZETIA) 10 MG tablet   Oral   Take 10 mg by mouth daily.         . fentaNYL (DURAGESIC - DOSED MCG/HR) 50 MCG/HR   Transdermal   Place 1 patch onto the skin every 3 (three) days.          . isosorbide mononitrate (IMDUR) 60 MG 24 hr tablet   Oral   Take 60 mg by mouth 2 (two) times daily.         . metoprolol tartrate (LOPRESSOR) 25 MG tablet   Oral   Take 1 tablet (25 mg total) by mouth 2 (two) times daily.   180 tablet   2   . nitroGLYCERIN (NITROSTAT) 0.4 MG SL tablet   Sublingual   Place 0.4 mg under the tongue every 5 (five) minutes as needed. Chest pain         .  Omega-3 Fatty Acids (FISH OIL) 1000 MG CAPS   Oral   Take 1 capsule by mouth daily.         . Oxycodone HCl 20 MG TABS   Oral   Take 20 mg by mouth every 6 (six) hours as needed. pain         . pantoprazole (PROTONIX) 40 MG tablet   Oral   Take 40 mg by mouth 2 (two) times daily.          . rosuvastatin (CRESTOR) 20 MG tablet   Oral   Take 20 mg by mouth daily.         . sertraline (ZOLOFT) 50 MG tablet   Oral   Take 50 mg by mouth 2 (two) times daily.          Triage Vitals: BP 159/97  Pulse 77  Temp(Src) 97.9 F (36.6 C) (Oral)  Resp 22  Ht 5\' 10"  (1.778 m)  Wt 140 lb (63.504 kg)  BMI 20.09 kg/m2  SpO2 98%  Physical Exam  Nursing note and vitals reviewed.  63 year old male, resting comfortably and in no acute distress. Vital signs are significant for tachypnea with respiratory rate of 22, and hypertension with blood pressure 159/97. Oxygen saturation is 98%, which is normal. Head is normocephalic and atraumatic. PERRLA, EOMI. Oropharynx is clear. Neck is nontender and supple without adenopathy or JVD. Back is nontender  and there is no CVA tenderness. Lungs are clear without rales, wheezes, or rhonchi. Chest is nontender. Heart has regular rate and rhythm without murmur. Abdomen is soft, flat, nontender without masses or hepatosplenomegaly and peristalsis is normoactive. Extremities have no cyanosis or edema, full range of motion is present. Skin is warm and dry without rash. Neurologic: Mental status is normal, cranial nerves are intact, there are no motor or sensory deficits.  ED Course  Procedures (including critical care time)  DIAGNOSTIC STUDIES: Oxygen Saturation is 98% on room air, normal by my interpretation.    COORDINATION OF CARE: 12:20 AM -Will order one Fentanyl patch to hold over patient. Patient reports that he will follow up with his PCP regarding any future prescriptions. Patient verbalizes understanding and agrees with treatment plan.    MDM   Final diagnoses:  Chronic pain    Chronic pain. I reviewed his record on [place] controlled substance reporting website and in addition to fentanyl patch, he also has prescription for oxycodone tablets and has been getting 180 a month. A new fentanyl patches applied in the ED and he is instructed to follow up with his PCP regarding his prescription.  I personally performed the services described in this documentation, which was scribed in my presence. The recorded information has been reviewed and is accurate.     Delora Fuel, MD 16/10/96 0454  Delora Fuel, MD 09/81/19 1478

## 2013-04-19 NOTE — Discharge Instructions (Signed)

## 2013-04-21 DEATH — deceased

## 2013-05-18 ENCOUNTER — Telehealth: Payer: Self-pay | Admitting: Cardiology

## 2013-05-18 NOTE — Telephone Encounter (Signed)
Patient checking the status of forms that he dropped off from [facility]. He got a letter from [facility] that if they don't receive information this week they will close the claim. Please call & advise.

## 2013-05-19 ENCOUNTER — Other Ambulatory Visit: Payer: Self-pay | Admitting: Radiology

## 2013-05-20 NOTE — Telephone Encounter (Signed)
Spoke to medical records and medical advised that they spoke to patient yesterday and documents have been faxed and scanned

## 2013-05-21 ENCOUNTER — Encounter (HOSPITAL_COMMUNITY)
Admission: RE | Admit: 2013-05-21 | Discharge: 2013-05-21 | Disposition: A | Discharge status: Home / Self Care | Payer: Medicare PPO | Source: Ambulatory Visit | Attending: Orthopaedic Surgery | Admitting: Orthopaedic Surgery

## 2013-05-21 NOTE — Patient Instructions (Signed)
[name]  05/21/2013   Your procedure is scheduled on:  05/25/2013  Report to [facility] at  5  AM.  Call this number if you have problems the morning of surgery: [phone]   Remember:   Do not eat food or drink liquids after midnight.   Take these medicines the morning of surgery with A SIP OF WATER:  Oxycodone, isosorbide, metoprolol, protonix, zoloft   Do not wear jewelry, make-up or nail polish.  Do not wear lotions, powders, or perfumes.   Do not shave 48 hours prior to surgery. Men may shave face and neck.  Do not bring valuables to the hospital.  [facility] is not responsible for any belongings or valuables.               Contacts, dentures or bridgework may not be worn into surgery.  Leave suitcase in the car. After surgery it may be brought to your room.  For patients admitted to the hospital, discharge time is determined by your treatment team.               Patients discharged the day of surgery will not be allowed to drive home.  Name and phone number of your driver: family  Special Instructions: Shower using CHG 2 nights before surgery and the night before surgery.  If you shower the day of surgery use CHG.  Use special wash - you have one bottle of CHG for all showers.  You should use approximately 1/3 of the bottle for each shower.   Please read over the following fact sheets that you were given: Pain Booklet, Coughing and Deep Breathing, Surgical Site Infection Prevention, Anesthesia Post-op Instructions and Care and Recovery After Surgery Olecranon Bursitis Bursitis is swelling and soreness (inflammation) of a fluid-filled sac (bursa) that covers and protects a joint. Olecranon bursitis occurs over the elbow.  CAUSES Bursitis can be caused by injury, overuse of the joint, arthritis, or infection.  SYMPTOMS   Tenderness, swelling, warmth, or redness over the elbow.  Elbow pain with movement. This is greater with bending the elbow.  Squeaking sound when  the bursa is rubbed or moved.  Increasing size of the bursa without pain or discomfort.  Fever with increasing pain and swelling if the bursa becomes infected. HOME CARE INSTRUCTIONS   Put ice on the affected area.  Put ice in a plastic bag.  Place a towel between your skin and the bag.  Leave the ice on for 15-20 minutes each hour while awake. Do this for the first 2 days.  When resting, elevate your elbow above the level of your heart. This helps reduce swelling.  Continue to put the joint through a full range of motion 4 times per day. Rest the injured joint at other times. When the pain lessens, begin normal slow movements and usual activities.  Only take over-the-counter or prescription medicines for pain, discomfort, or fever as directed by your caregiver.  Reduce your intake of milk and related dairy products (cheese, yogurt). They may make your condition worse. SEEK IMMEDIATE MEDICAL CARE IF:   Your pain increases even during treatment.  You have a fever.  You have heat and inflammation over the bursa and elbow.  You have a red line that goes up your arm.  You have pain with movement of your elbow. MAKE SURE YOU:   Understand these instructions.  Will watch your condition.  Will get help right away if you  are not doing well or get worse. Document Released: 02/06/2006 Document Revised: 04/01/2011 Document Reviewed: 12/23/2006 [facility] Patient Information 2014 [place], [place]. PATIENT INSTRUCTIONS POST-ANESTHESIA  IMMEDIATELY FOLLOWING SURGERY:  Do not drive or operate machinery for the first twenty four hours after surgery.  Do not make any important decisions for twenty four hours after surgery or while taking narcotic pain medications or sedatives.  If you develop intractable nausea and vomiting or a severe headache please notify your doctor immediately.  FOLLOW-UP:  Please make an appointment with your surgeon as instructed. You do not need to follow up with  anesthesia unless specifically instructed to do so.  WOUND CARE INSTRUCTIONS (if applicable):  Keep a dry clean dressing on the anesthesia/puncture wound site if there is drainage.  Once the wound has quit draining you may leave it open to air.  Generally you should leave the bandage intact for twenty four hours unless there is drainage.  If the epidural site drains for more than 36-48 hours please call the anesthesia department.  QUESTIONS?:  Please feel free to call your physician or the hospital operator if you have any questions, and they will be happy to assist you.

## 2013-05-24 NOTE — H&P (Signed)
[name] is an 63 y.o. male.   Chief Complaint: Left elbow draining HPI: He has had removal of left olecranon bursa August 2014.  He did well originally but has developed a chronic very small drainage from the area over the last several months that is not clearing up.  He has had some redness of the elbow treated with antibiotics.  I have scheduled him for surgical excision of the drainage tract.  Risks and imponderables discussed with him.  He asked appropriate questions and agrees to procedure on the left elbow.  Past Medical History  Diagnosis Date  . Cardiac tamponade   . Aortic dissection     TYPE 3  . HTN (hypertension)   . CAD (coronary artery disease)   . Hypercholesterolemia   . Chronic back pain   . Hiatal hernia   . Schatzki's ring   . Chronic fatigue   . Chronic pain   . Peptic ulcer disease 08/2010    EGD     Past Surgical History  Procedure Laterality Date  . Sternal wire removeal  12/31/2007    HENDRICKSON  . Cabg x 6  07/31/2007    HENDRICKSON  . Cardiac catheterization    . Knee surgery Right     acl  . Shoulder surgery Left   . Orif scapular fracture Right   . Back surgery    . Olecranon bursectomy Left 08/27/2012    Procedure: EXCISION LEFT OLECRANON BURSA;  Surgeon: Sanjuana Kava, MD;  Location: [facility];  Service: Orthopedics;  Laterality: Left;    Family History  Problem Relation Age of Onset  . Heart disease Mother   . Heart disease Father    Social History:  reports that he has been smoking Cigarettes.  He has a 25 pack-year smoking history. He has never used smokeless tobacco. He reports that he does not drink alcohol or use illicit drugs.  Allergies: No Known Allergies  No prescriptions prior to admission    No results found for this or any previous visit (from the past 48 hour(s)). No results found.  Review of Systems  Cardiovascular:       History of hypertension, aortic dissection and cardia tamponade, coronary artery disease,  hypercholesterolemia.  Musculoskeletal: Positive for back pain and joint pain (He has had removal of olecranon bursa on the left in past.  He has chronic area of drainage now.).    There were no vitals taken for this visit. Physical Exam  Constitutional: He is oriented to person, place, and time. He appears well-developed and well-nourished.  HENT:  Head: Normocephalic and atraumatic.  Eyes: Conjunctivae and EOM are normal. Pupils are equal, round, and reactive to light.  Neck: Neck supple.  Cardiovascular: Normal rate, regular rhythm and intact distal pulses.   Respiratory: Effort normal.  GI: Soft.  Musculoskeletal: He exhibits tenderness (The left elbow is tender over the olecranon area with a pin point area of yellow drainage.  It is tender, not red.   ROM is full.).  Neurological: He is alert and oriented to person, place, and time. He has normal reflexes.  Skin: Skin is warm and dry.  Psychiatric: He has a normal mood and affect. His behavior is normal. Judgment and thought content normal.     Assessment/Plan Chronic area of drainage left elbow post olecranon bursa excision in August, 2014.  For excision of drainage tract left elbow in the OR.  This will be an outpatient procedure.  Sanjuana Kava 05/24/2013, 2:39  PM

## 2013-05-25 ENCOUNTER — Encounter (HOSPITAL_COMMUNITY): Payer: Self-pay | Admitting: *Deleted

## 2013-05-25 ENCOUNTER — Encounter (HOSPITAL_COMMUNITY)
Admission: RE | Disposition: A | Discharge status: Home / Self Care | Payer: Self-pay | Source: Ambulatory Visit | Attending: Orthopaedic Surgery

## 2013-05-25 ENCOUNTER — Ambulatory Visit (HOSPITAL_COMMUNITY): Payer: Medicare PPO | Admitting: Anesthesiology

## 2013-05-25 ENCOUNTER — Ambulatory Visit (HOSPITAL_COMMUNITY)
Admission: RE | Admit: 2013-05-25 | Discharge: 2013-05-25 | Disposition: A | Discharge status: Home / Self Care | Payer: Medicare PPO | Source: Ambulatory Visit | Attending: Orthopaedic Surgery | Admitting: Orthopaedic Surgery

## 2013-05-25 ENCOUNTER — Encounter (HOSPITAL_COMMUNITY): Payer: Medicare PPO | Admitting: Anesthesiology

## 2013-05-25 DIAGNOSIS — T8183XA Persistent postprocedural fistula, initial encounter: Secondary | ICD-10-CM | POA: Insufficient documentation

## 2013-05-25 DIAGNOSIS — E7800 Pure hypercholesterolemia, unspecified: Secondary | ICD-10-CM | POA: Insufficient documentation

## 2013-05-25 DIAGNOSIS — I2510 Atherosclerotic heart disease of native coronary artery without angina pectoris: Secondary | ICD-10-CM | POA: Insufficient documentation

## 2013-05-25 DIAGNOSIS — F17200 Nicotine dependence, unspecified, uncomplicated: Secondary | ICD-10-CM | POA: Insufficient documentation

## 2013-05-25 DIAGNOSIS — M549 Dorsalgia, unspecified: Secondary | ICD-10-CM | POA: Insufficient documentation

## 2013-05-25 DIAGNOSIS — Z951 Presence of aortocoronary bypass graft: Secondary | ICD-10-CM | POA: Insufficient documentation

## 2013-05-25 DIAGNOSIS — G8929 Other chronic pain: Secondary | ICD-10-CM | POA: Insufficient documentation

## 2013-05-25 DIAGNOSIS — I10 Essential (primary) hypertension: Secondary | ICD-10-CM | POA: Insufficient documentation

## 2013-05-25 DIAGNOSIS — I739 Peripheral vascular disease, unspecified: Secondary | ICD-10-CM | POA: Insufficient documentation

## 2013-05-25 DIAGNOSIS — Y838 Other surgical procedures as the cause of abnormal reaction of the patient, or of later complication, without mention of misadventure at the time of the procedure: Secondary | ICD-10-CM | POA: Insufficient documentation

## 2013-05-25 DIAGNOSIS — Z8711 Personal history of peptic ulcer disease: Secondary | ICD-10-CM | POA: Insufficient documentation

## 2013-05-25 DIAGNOSIS — K449 Diaphragmatic hernia without obstruction or gangrene: Secondary | ICD-10-CM | POA: Insufficient documentation

## 2013-05-25 HISTORY — PX: OLECRANON BURSECTOMY: SHX2097

## 2013-05-25 LAB — CBC WITH DIFFERENTIAL/PLATELET
Basophils Absolute: 0.1 10*3/uL (ref 0.0–0.1)
Basophils Relative: 1 % (ref 0–1)
EOS ABS: 0.3 10*3/uL (ref 0.0–0.7)
Eosinophils Relative: 5 % (ref 0–5)
HEMATOCRIT: 37 % — AB (ref 39.0–52.0)
Hemoglobin: 12.5 g/dL — ABNORMAL LOW (ref 13.0–17.0)
Lymphocytes Relative: 42 % (ref 12–46)
Lymphs Abs: 2.7 10*3/uL (ref 0.7–4.0)
MCH: 32.5 pg (ref 26.0–34.0)
MCHC: 33.8 g/dL (ref 30.0–36.0)
MCV: 96.1 fL (ref 78.0–100.0)
MONOS PCT: 7 % (ref 3–12)
Monocytes Absolute: 0.4 10*3/uL (ref 0.1–1.0)
NEUTROS PCT: 46 % (ref 43–77)
Neutro Abs: 3 10*3/uL (ref 1.7–7.7)
PLATELETS: 141 10*3/uL — AB (ref 150–400)
RBC: 3.85 MIL/uL — AB (ref 4.22–5.81)
RDW: 15 % (ref 11.5–15.5)
WBC: 6.5 10*3/uL (ref 4.0–10.5)

## 2013-05-25 LAB — COMPREHENSIVE METABOLIC PANEL
ALBUMIN: 3.7 g/dL (ref 3.5–5.2)
ALK PHOS: 56 U/L (ref 39–117)
ALT: 7 U/L (ref 0–53)
AST: 18 U/L (ref 0–37)
BUN: 15 mg/dL (ref 6–23)
CO2: 25 mEq/L (ref 19–32)
Calcium: 9 mg/dL (ref 8.4–10.5)
Chloride: 105 mEq/L (ref 96–112)
Creatinine, Ser: 0.99 mg/dL (ref 0.50–1.35)
GFR calc Af Amer: >90 mL/min (ref >90)
GFR calc non Af Amer: 87 mL/min — ABNORMAL LOW (ref >90)
Glucose, Bld: 94 mg/dL (ref 70–99)
POTASSIUM: 3.8 meq/L (ref 3.7–5.3)
Sodium: 142 mEq/L (ref 137–147)
Total Bilirubin: <0.2 mg/dL — ABNORMAL LOW (ref 0.3–1.2)
Total Protein: 6.8 g/dL (ref 6.0–8.3)

## 2013-05-25 SURGERY — BURSECTOMY, ELBOW
Anesthesia: Regional | Site: Elbow | Laterality: Left

## 2013-05-25 MED ORDER — LIDOCAINE HCL (CARDIAC) 10 MG/ML IV SOLN
INTRAVENOUS | Status: DC | PRN
  Administered 2013-05-25: 50 mg via INTRAVENOUS

## 2013-05-25 MED ORDER — LACTATED RINGERS IV SOLN
INTRAVENOUS | Status: DC | PRN
  Administered 2013-05-25 (×2): via INTRAVENOUS

## 2013-05-25 MED ORDER — MIDAZOLAM HCL 2 MG/2ML IJ SOLN
INTRAMUSCULAR | Status: AC
  Filled 2013-05-25: qty 2

## 2013-05-25 MED ORDER — ONDANSETRON HCL 4 MG/2ML IJ SOLN: 4.0000 mg | Freq: Once | INTRAMUSCULAR | Status: DC | PRN

## 2013-05-25 MED ORDER — FENTANYL CITRATE 0.05 MG/ML IJ SOLN
INTRAMUSCULAR | Status: AC
  Filled 2013-05-25: qty 2

## 2013-05-25 MED ORDER — CHLORHEXIDINE GLUCONATE 4 % EX LIQD
60.0000 mL | Freq: Once | CUTANEOUS | Status: AC
  Administered 2013-05-25: 4 via TOPICAL

## 2013-05-25 MED ORDER — SUCCINYLCHOLINE CHLORIDE 20 MG/ML IJ SOLN
INTRAMUSCULAR | Status: AC
  Filled 2013-05-25: qty 1

## 2013-05-25 MED ORDER — LIDOCAINE HCL (PF) 0.5 % IJ SOLN
INTRAMUSCULAR | Status: AC
  Filled 2013-05-25: qty 50

## 2013-05-25 MED ORDER — PROPOFOL INFUSION 10 MG/ML OPTIME
INTRAVENOUS | Status: DC | PRN
  Administered 2013-05-25: 50 ug/kg/min via INTRAVENOUS

## 2013-05-25 MED ORDER — PROPOFOL 10 MG/ML IV BOLUS
INTRAVENOUS | Status: AC
  Filled 2013-05-25: qty 20

## 2013-05-25 MED ORDER — VANCOMYCIN HCL IN DEXTROSE 1-5 GM/200ML-% IV SOLN
1000.0000 mg | INTRAVENOUS | Status: AC
  Administered 2013-05-25: 1000 mg via INTRAVENOUS
  Filled 2013-05-25: qty 200

## 2013-05-25 MED ORDER — LIDOCAINE HCL (PF) 0.5 % IJ SOLN
INTRAMUSCULAR | Status: DC | PRN
  Administered 2013-05-25: 50 mL

## 2013-05-25 MED ORDER — LACTATED RINGERS IV SOLN
INTRAVENOUS | Status: DC
  Administered 2013-05-25: 09:00:00 via INTRAVENOUS

## 2013-05-25 MED ORDER — MIDAZOLAM HCL 2 MG/2ML IJ SOLN
1.0000 mg | INTRAMUSCULAR | Status: DC | PRN
  Administered 2013-05-25: 2 mg via INTRAVENOUS

## 2013-05-25 MED ORDER — 0.9 % SODIUM CHLORIDE (POUR BTL) OPTIME
TOPICAL | Status: DC | PRN
  Administered 2013-05-25: 1000 mL

## 2013-05-25 MED ORDER — FENTANYL CITRATE 0.05 MG/ML IJ SOLN
25.0000 ug | INTRAMUSCULAR | Status: AC
  Administered 2013-05-25: 25 ug via INTRAVENOUS

## 2013-05-25 MED ORDER — FENTANYL CITRATE 0.05 MG/ML IJ SOLN
INTRAMUSCULAR | Status: DC | PRN
  Administered 2013-05-25: 50 ug via INTRAVENOUS

## 2013-05-25 MED ORDER — FENTANYL CITRATE 0.05 MG/ML IJ SOLN: 25.0000 ug | INTRAMUSCULAR | Status: DC | PRN

## 2013-05-25 MED ORDER — FENTANYL CITRATE 0.05 MG/ML IJ SOLN
INTRAMUSCULAR | Status: AC
  Filled 2013-05-25: qty 5

## 2013-05-25 MED ORDER — LIDOCAINE HCL (PF) 1 % IJ SOLN
INTRAMUSCULAR | Status: AC
  Filled 2013-05-25: qty 5

## 2013-05-25 SURGICAL SUPPLY — 40 items
BAG HAMPER (MISCELLANEOUS) ×3 IMPLANT
BANDAGE ELASTIC 4 VELCRO NS (GAUZE/BANDAGES/DRESSINGS) ×2 IMPLANT
BANDAGE ELASTIC 6 VELCRO NS (GAUZE/BANDAGES/DRESSINGS) IMPLANT
BANDAGE ESMARK 4X12 BL STRL LF (DISPOSABLE) ×1 IMPLANT
BLADE 15 SAFETY STRL DISP (BLADE) ×2 IMPLANT
BNDG CMPR 12X4 ELC STRL LF (DISPOSABLE) ×2
BNDG ESMARK 4X12 BLUE STRL LF (DISPOSABLE) ×6
CLOTH BEACON ORANGE TIMEOUT ST (SAFETY) ×3 IMPLANT
COVER SURGICAL LIGHT HANDLE (MISCELLANEOUS) ×2 IMPLANT
CUFF TOURNIQUET SINGLE 18IN (TOURNIQUET CUFF) ×3 IMPLANT
DRAIN PENROSE 12X.25 LTX STRL (MISCELLANEOUS) ×1 IMPLANT
DRSG XEROFORM 1X8 (GAUZE/BANDAGES/DRESSINGS) ×2 IMPLANT
DURAPREP 26ML APPLICATOR (WOUND CARE) IMPLANT
ELECT REM PT RETURN 9FT ADLT (ELECTROSURGICAL) ×3
ELECTRODE REM PT RTRN 9FT ADLT (ELECTROSURGICAL) ×1 IMPLANT
GLOVE BIO SURGEON STRL SZ8 (GLOVE) ×3 IMPLANT
GLOVE BIO SURGEON STRL SZ8.5 (GLOVE) ×3 IMPLANT
GLOVE ECLIPSE 6.5 STRL STRAW (GLOVE) ×2 IMPLANT
GLOVE ECLIPSE 7.0 STRL STRAW (GLOVE) ×2 IMPLANT
GLOVE ECLIPSE 8.5 STRL (GLOVE) ×2 IMPLANT
GLOVE INDICATOR 7.0 STRL GRN (GLOVE) ×2 IMPLANT
GLOVE INDICATOR 7.5 STRL GRN (GLOVE) ×4 IMPLANT
GLOVE INDICATOR 8.5 STRL (GLOVE) ×2 IMPLANT
GOWN STRL REUS W/TWL LRG LVL3 (GOWN DISPOSABLE) ×8 IMPLANT
GOWN STRL REUS W/TWL XL LVL3 (GOWN DISPOSABLE) ×3 IMPLANT
KIT ROOM TURNOVER APOR (KITS) ×3 IMPLANT
MANIFOLD NEPTUNE II (INSTRUMENTS) ×3 IMPLANT
NS IRRIG 1000ML POUR BTL (IV SOLUTION) ×3 IMPLANT
PACK BASIC LIMB (CUSTOM PROCEDURE TRAY) ×3 IMPLANT
PAD ABD 5X9 TENDERSORB (GAUZE/BANDAGES/DRESSINGS) ×4 IMPLANT
PAD ARMBOARD 7.5X6 YLW CONV (MISCELLANEOUS) ×3 IMPLANT
PAD CAST 4YDX4 CTTN HI CHSV (CAST SUPPLIES) ×2 IMPLANT
PADDING CAST COTTON 4X4 STRL (CAST SUPPLIES) ×6
SET BASIN LINEN APH (SET/KITS/TRAYS/PACK) ×3 IMPLANT
SOL PREP PROV IODINE SCRUB 4OZ (MISCELLANEOUS) ×2 IMPLANT
SPONGE GAUZE 4X4 12PLY (GAUZE/BANDAGES/DRESSINGS) ×3 IMPLANT
SPONGE LAP 18X18 X RAY DECT (DISPOSABLE) ×3 IMPLANT
STAPLER VISISTAT 35W (STAPLE) ×2 IMPLANT
SUT CHROMIC 2 0 CT 1 (SUTURE) ×5 IMPLANT
SUT PLAIN CT 1/2CIR 2-0 27IN (SUTURE) ×3 IMPLANT

## 2013-05-25 NOTE — OR Nursing (Signed)
Patient unable to void,  Renda Rolls RN  attempted to do in and out cath, unable to get urine specimen.  Dr. Luna Glasgow notified , ok to not obtain UA. Dr. Patsey Berthold aware.

## 2013-05-25 NOTE — Progress Notes (Signed)
The History and Physical is unchanged. I have examined the patient. The patient is medically able to have surgery on the left elbow . [name]

## 2013-05-25 NOTE — Anesthesia Postprocedure Evaluation (Signed)
  Anesthesia Post-op Note  Patient: [name]  Procedure(s) Performed: Procedure(s): EXCISION OLECRANON BURSECTOMY (Left)  Patient Location: PACU  Anesthesia Type:Bier block  Level of Consciousness: awake, alert , oriented and patient cooperative  Airway and Oxygen Therapy: Patient Spontanous Breathing and Patient connected to nasal cannula oxygen  Post-op Pain: mild  Post-op Assessment: Post-op Vital signs reviewed, Patient's Cardiovascular Status Stable, Respiratory Function Stable, Patent Airway, No signs of Nausea or vomiting and Pain level controlled  Post-op Vital Signs: Reviewed and stable  Last Vitals:  Filed Vitals:   05/25/13 1042  BP: 158/84  Pulse:   Temp: 36.5 C  Resp: 10    Complications: No apparent anesthesia complications

## 2013-05-25 NOTE — OR Nursing (Signed)
Mr. Smola never showed up for his PAT.   Delphia Grates attempted to call him 05/22/2013 and he said he needed to re Schedule his appointment for his PAT.  He never did.  I called Dr. Briscoe Burns office yesterday 05/23/2013 to let them know he never came for his PAT. Dr. Briscoe Burns office got in touch with patient.  Patient wanted to come on day of surgery to have his PAT work up. I called patient and we discussed  what time to be here and to be NPO after MN except to take Metoprolol and Isorbide  with a sip of water.  He verbalized understanding . To arrive 05/25/2013  At 0800.

## 2013-05-25 NOTE — Brief Op Note (Signed)
05/25/2013  10:35 AM  PATIENT:  [name]  63 y.o. male  PRE-OPERATIVE DIAGNOSIS:  left olecranon draining sinus tract  POST-OPERATIVE DIAGNOSIS:  same  PROCEDURE:  Excision sinus tract and tissue left elbow post olecranon bursectomy in past.  SURGEON:  Surgeon(s) and Role:    * Sanjuana Kava, MD - Primary  PHYSICIAN ASSISTANT:   ASSISTANTS: none   ANESTHESIA:   regional  EBL:  Total I/O In: 600 [I.V.:600] Out: 5 [Blood:5]  BLOOD ADMINISTERED:none  DRAINS: none   LOCAL MEDICATIONS USED:  NONE  SPECIMEN:  Source of Specimen:  Left elbow tissue from sinus tract area  DISPOSITION OF SPECIMEN:  PATHOLOGY  COUNTS:  YES  TOURNIQUET:   Total Tourniquet Time Documented: Upper Arm (Left) - 41 minutes Total: Upper Arm (Left) - 41 minutes   DICTATION: .Other Dictation: Dictation Number [id number]  PLAN OF CARE: Discharge to home after PACU  PATIENT DISPOSITION:  PACU - hemodynamically stable.   Delay start of Pharmacological VTE agent (>24hrs) due to surgical blood loss or risk of bleeding: not applicable

## 2013-05-25 NOTE — Anesthesia Procedure Notes (Addendum)
Anesthesia Regional Block:  Bier block (IV Regional)  Pre-Anesthetic Checklist: ,, timeout performed, Correct Patient, Correct Site, Correct Laterality, Correct Procedure,, site marked, surgical consent,, at surgeon's request  Laterality: Left     Needles:  Injection technique: Single-shot  Needle Type: Other      Needle Gauge: 22 and 22 G    Additional Needles: Bier block (IV Regional)  Nerve Stimulator or Paresthesia:   Additional Responses:  Pulse checked post tourniquet inflation. IV NSL discontinued post injection. Narrative:  End time: 05/25/2013 9:52 AM  Performed by: Personally    Date/Time: 05/25/2013 9:39 AM Performed by: Andree Elk, Carylon Tamburro A Pre-anesthesia Checklist: Patient identified, Timeout performed, Emergency Drugs available, Suction available and Patient being monitored Patient Re-evaluated:Patient Re-evaluated prior to inductionOxygen Delivery Method: Simple face mask

## 2013-05-25 NOTE — Discharge Instructions (Signed)
Keep wound dry.  Use sling.  Come to office tomorrow morning for wound check.  Come to hospital if any problem tonight.  Take pain medicine you already have.

## 2013-05-25 NOTE — Transfer of Care (Signed)
Immediate Anesthesia Transfer of Care Note  Patient: [name]  Procedure(s) Performed: Procedure(s): EXCISION OLECRANON BURSECTOMY (Left)  Patient Location: PACU  Anesthesia Type:Bier block  Level of Consciousness: awake, alert , oriented and patient cooperative  Airway & Oxygen Therapy: Patient Spontanous Breathing and Patient connected to nasal cannula oxygen  Post-op Assessment: Report given to PACU RN and Post -op Vital signs reviewed and stable  Post vital signs: Reviewed and stable  Complications: No apparent anesthesia complications

## 2013-05-25 NOTE — Anesthesia Preprocedure Evaluation (Signed)
Anesthesia Evaluation  Patient identified by MRN, date of birth, ID band Patient awake    Reviewed: Allergy & Precautions, H&P , NPO status , Patient's Chart, lab work & pertinent test results, reviewed documented beta blocker date and time   Airway Mallampati: II TM Distance: >3 FB     Dental  (+) Edentulous Upper, Implants   Pulmonary Current Smoker,  breath sounds clear to auscultation        Cardiovascular hypertension, Pt. on medications and Pt. on home beta blockers + CAD, + CABG and + Peripheral Vascular Disease (aortic dissection) Rhythm:Regular Rate:Normal     Neuro/Psych    GI/Hepatic hiatal hernia, PUD,   Endo/Other    Renal/GU      Musculoskeletal   Abdominal   Peds  Hematology   Anesthesia Other Findings   Reproductive/Obstetrics                           Anesthesia Physical Anesthesia Plan  ASA: III  Anesthesia Plan: Bier Block   Post-op Pain Management:    Induction: Intravenous  Airway Management Planned: Nasal Cannula  Additional Equipment:   Intra-op Plan:   Post-operative Plan:   Informed Consent: I have reviewed the patients History and Physical, chart, labs and discussed the procedure including the risks, benefits and alternatives for the proposed anesthesia with the patient or authorized representative who has indicated his/her understanding and acceptance.     Plan Discussed with:   Anesthesia Plan Comments: (Possible Gen LMA discussed and he agrees with plan.)        Anesthesia Quick Evaluation

## 2013-05-26 NOTE — Op Note (Signed)
NAMEDEMONTEZ, NOVACK NO.:  [id number]  MEDICAL RECORD NO.:  [id number]  LOCATION:  [facility]                          FACILITY:  [facility]  PHYSICIAN:  J. Sanjuana Kava, M.D. DATE OF BIRTH:  [birth year]  DATE OF PROCEDURE: DATE OF DISCHARGE:  05/25/2013                              OPERATIVE REPORT   PREOPERATIVE DIAGNOSIS:  Left olecranon with draining sinus tract, status post olecranon bursectomy in August.  POSTOPERATIVE DIAGNOSIS:  Left olecranon with draining sinus tract, status post olecranon bursectomy in August.  PROCEDURE:  Excision of draining sinus tract, left elbow and associated tissues.  ANESTHESIA:  Regional Bier block.  TOURNIQUET TIME:  41 minutes.  DRAINS:  No drains.  SURGEON:  J. Sanjuana Kava, M.D.  INDICATIONS FOR PROCEDURE:  The patient had excision of olecranon bursa back in August.  He did well initially, but then he started having some drainage from a very small pinhole point from the elbow.  It became red and inflamed several weeks ago, I gave him antibiotics and that is resolved.  Sinus tract needs to be removed in the area of the infection that he had and these were removed because it can keep recurring.  I explained to him that we need to protect the elbow for the next several weeks and make sure he does get any excess pressure to it.  It is possible it could reoccur and he understood and appeared and agreed to procedure as outlined.  DESCRIPTION OF PROCEDURE:  The patient was taken in the holding area. The left elbow was identified as correct surgical site.  I placed a mark on the left elbow and was brought to the operating room, given anesthesia and regional Bier block while in supine.  He was then prepped and draped with Betadine.  We had a time-out identifying the patient as Mr. Rufo.  He had Bier block anesthesia in his left upper extremity and was therefore excision and draining sinus tract wound of the left elbow. All  instrumentation was properly positioned and working and everyone in the room knew each other.  I obtained a culture before we did this and used a small probe and elicited what a deep draining tract was.  It was not that long and it was about an inch and a half to 2 inches long.  It was superficial. Using elliptical incision, we removed the skin lesion where the drainage was.  I then carefully sharp dissection, followed the bursa sac was that was draining from and removed that.  There was no gross purulence.  The area of the wound was then reapproximated using 2-0 chromic, 2-0 plain, and skin staples.  Sterile dressing applied.  Bulky dressing applied. The patient's tourniquet was deflated in usual fashion after Bier block anesthesia up to 41 minutes.  I will see him in the office tomorrow. Appropriate analgesia will be given for pain..  If any difficulties contact me through the office hospital beeper system.          ______________________________ Lenna Sciara. Sanjuana Kava, M.D.     JWK/MEDQ  D:  05/25/2013  T:  05/26/2013  Job:  [id number]

## 2013-05-27 ENCOUNTER — Encounter (HOSPITAL_COMMUNITY): Payer: Self-pay | Admitting: Orthopaedic Surgery

## 2013-05-27 LAB — WOUND CULTURE: Gram Stain: NONE SEEN

## 2013-05-30 ENCOUNTER — Emergency Department (HOSPITAL_COMMUNITY)
Admission: EM | Admit: 2013-05-30 | Discharge: 2013-05-30 | Disposition: A | Discharge status: Home / Self Care | Payer: Medicare PPO | Attending: Emergency Medicine | Admitting: Emergency Medicine

## 2013-05-30 ENCOUNTER — Encounter (HOSPITAL_COMMUNITY): Payer: Self-pay | Admitting: Emergency Medicine

## 2013-05-30 DIAGNOSIS — I2510 Atherosclerotic heart disease of native coronary artery without angina pectoris: Secondary | ICD-10-CM | POA: Insufficient documentation

## 2013-05-30 DIAGNOSIS — F17200 Nicotine dependence, unspecified, uncomplicated: Secondary | ICD-10-CM | POA: Insufficient documentation

## 2013-05-30 DIAGNOSIS — Z9889 Other specified postprocedural states: Secondary | ICD-10-CM | POA: Insufficient documentation

## 2013-05-30 DIAGNOSIS — I10 Essential (primary) hypertension: Secondary | ICD-10-CM | POA: Insufficient documentation

## 2013-05-30 DIAGNOSIS — L03119 Cellulitis of unspecified part of limb: Secondary | ICD-10-CM

## 2013-05-30 DIAGNOSIS — Z792 Long term (current) use of antibiotics: Secondary | ICD-10-CM | POA: Insufficient documentation

## 2013-05-30 DIAGNOSIS — E7800 Pure hypercholesterolemia, unspecified: Secondary | ICD-10-CM | POA: Insufficient documentation

## 2013-05-30 DIAGNOSIS — K279 Peptic ulcer, site unspecified, unspecified as acute or chronic, without hemorrhage or perforation: Secondary | ICD-10-CM | POA: Insufficient documentation

## 2013-05-30 DIAGNOSIS — G8929 Other chronic pain: Secondary | ICD-10-CM | POA: Insufficient documentation

## 2013-05-30 DIAGNOSIS — Z951 Presence of aortocoronary bypass graft: Secondary | ICD-10-CM | POA: Insufficient documentation

## 2013-05-30 DIAGNOSIS — Z79899 Other long term (current) drug therapy: Secondary | ICD-10-CM | POA: Insufficient documentation

## 2013-05-30 DIAGNOSIS — IMO0002 Reserved for concepts with insufficient information to code with codable children: Secondary | ICD-10-CM | POA: Insufficient documentation

## 2013-05-30 MED ORDER — CLINDAMYCIN HCL 150 MG PO CAPS
300.0000 mg | ORAL_CAPSULE | Freq: Once | ORAL | Status: AC
  Administered 2013-05-30: 300 mg via ORAL
  Filled 2013-05-30: qty 2

## 2013-05-30 MED ORDER — CLINDAMYCIN HCL 300 MG PO CAPS: 300.0000 mg | ORAL_CAPSULE | Freq: Four times a day (QID) | ORAL | Status: DC

## 2013-05-30 NOTE — ED Provider Notes (Signed)
CSN: [id number]     Arrival date & time 05/30/13  0053 History   First MD Initiated Contact with Patient 05/30/13 0110     Chief Complaint  Patient presents with  . Elbow Pain     (Consider location/radiation/quality/duration/timing/severity/associated sxs/prior Treatment) HPI Comments: Patient is a 63 year old male who is several days status post surgical procedure to his left elbow. This was performed by Dr. Luna Glasgow. He states the area is becoming more uncomfortable. He is noticing redness and drainage and is concerned about the possibility of infection. He denies fevers or chills. He denies nausea, vomiting. He has a history of chronic pain and has a fentanyl patch and oxycodone which does not seem to be relieving his discomfort.  The history is provided by the patient.    Past Medical History  Diagnosis Date  . Cardiac tamponade   . Aortic dissection     TYPE 3  . HTN (hypertension)   . CAD (coronary artery disease)   . Hypercholesterolemia   . Chronic back pain   . Hiatal hernia   . Schatzki's ring   . Chronic fatigue   . Chronic pain   . Peptic ulcer disease 08/2010    EGD    Past Surgical History  Procedure Laterality Date  . Sternal wire removeal  12/31/2007    [facility]  . Cabg x 6  07/31/2007    [facility]  . Cardiac catheterization    . Knee surgery Right     acl  . Shoulder surgery Left   . Orif scapular fracture Right   . Back surgery    . Olecranon bursectomy Left 08/27/2012    Procedure: EXCISION LEFT OLECRANON BURSA;  Surgeon: Sanjuana Kava, MD;  Location: [facility];  Service: Orthopedics;  Laterality: Left;  . Olecranon bursectomy Left 05/25/2013    Procedure:  Excision sinus tract and tissue left elbow ;  Surgeon: Sanjuana Kava, MD;  Location: [facility];  Service: Orthopedics;  Laterality: Left;   Family History  Problem Relation Age of Onset  . Heart disease Mother   . Heart disease Father    History  Substance Use Topics  . Smoking status: Current  Some Day Smoker -- 0.50 packs/day for 50 years    Types: Cigarettes  . Smokeless tobacco: Never Used  . Alcohol Use: No    Review of Systems  All other systems reviewed and are negative.     Allergies  Review of patient's allergies indicates no known allergies.  Home Medications   Prior to Admission medications   Medication Sig Start Date End Date Taking? Authorizing Provider  ezetimibe (ZETIA) 10 MG tablet Take 10 mg by mouth daily.   Yes Historical Provider, MD  fentaNYL (DURAGESIC - DOSED MCG/HR) 50 MCG/HR Place 1 patch onto the skin every 3 (three) days.  09/04/10  Yes Historical Provider, MD  isosorbide mononitrate (IMDUR) 60 MG 24 hr tablet Take 60 mg by mouth 2 (two) times daily.   Yes Historical Provider, MD  metoprolol tartrate (LOPRESSOR) 25 MG tablet Take 1 tablet (25 mg total) by mouth 2 (two) times daily. 12/28/12  Yes Candee Furbish, MD  nitroGLYCERIN (NITROSTAT) 0.4 MG SL tablet Place 0.4 mg under the tongue every 5 (five) minutes as needed. Chest pain   Yes Historical Provider, MD  Omega-3 Fatty Acids (FISH OIL) 1000 MG CAPS Take 1 capsule by mouth daily.   Yes Historical Provider, MD  Oxycodone HCl 20 MG TABS Take 20 mg by mouth every 6 (  six) hours as needed. pain 11/26/12  Yes Historical Provider, MD  pantoprazole (PROTONIX) 40 MG tablet Take 40 mg by mouth 2 (two) times daily.  08/08/10  Yes Historical Provider, MD  rosuvastatin (CRESTOR) 20 MG tablet Take 20 mg by mouth daily.   Yes Historical Provider, MD  sertraline (ZOLOFT) 50 MG tablet Take 50 mg by mouth 2 (two) times daily.   Yes Historical Provider, MD  clindamycin (CLEOCIN) 300 MG capsule Take 1 capsule (300 mg total) by mouth 4 (four) times daily. X 7 days 05/30/13   Veryl Speak, MD   BP 158/94  Pulse 57  Temp(Src) 97.5 F (36.4 C) (Oral)  Resp 20  Ht 5\' 10"  (1.778 m)  Wt 160 lb (72.576 kg)  BMI 22.96 kg/m2  SpO2 97% Physical Exam  Nursing note and vitals reviewed. Constitutional: He is oriented to  person, place, and time. He appears well-developed and well-nourished. No distress.  HENT:  Head: Normocephalic and atraumatic.  Neck: Normal range of motion.  Musculoskeletal: Normal range of motion.  The left elbow is noted to have staples in place. There is swelling and erythema in the area. There is slight drainage that is serous.  He has good range of motion in the elbow without significant limitation. The distal pulses, motor, and sensory are intact.  Neurological: He is alert and oriented to person, place, and time.  Skin: Skin is warm and dry. He is not diaphoretic.    ED Course  Procedures (including critical care time) Labs Review Labs Reviewed - No data to display  Imaging Review No results found.   EKG Interpretation None      MDM   Final diagnoses:  Cellulitis of elbow    There appears to be cellulitis within the wound. This will be treated with clindamycin as the wound culture results have indicated this is an appropriate choice. He will be discharged to home. Is not improving in the next 2 days, he is to followup with Dr. Luna Glasgow. He understands to return to the ER for symptoms substantially worsen or change.    Veryl Speak, MD 05/30/13 [phone]

## 2013-05-30 NOTE — ED Notes (Signed)
Patient states he has an infection in his left elbow that he states something was removed on Tuesday.  Patient c/o increased pain; states he is using Fentanyl patch.

## 2013-05-30 NOTE — Discharge Instructions (Signed)
Clindamycin as prescribed.  Followup with Dr. Luna Glasgow in the next 2 days for a recheck.  Return to the ER if you develop high fever, vomiting, shaking chills, or other new and concerning symptoms.   Cellulitis Cellulitis is an infection of the skin and the tissue beneath it. The infected area is usually red and tender. Cellulitis occurs most often in the arms and lower legs.  CAUSES  Cellulitis is caused by bacteria that enter the skin through cracks or cuts in the skin. The most common types of bacteria that cause cellulitis are Staphylococcus and Streptococcus. SYMPTOMS   Redness and warmth.  Swelling.  Tenderness or pain.  Fever. DIAGNOSIS  Your caregiver can usually determine what is wrong based on a physical exam. Blood tests may also be done. TREATMENT  Treatment usually involves taking an antibiotic medicine. HOME CARE INSTRUCTIONS   Take your antibiotics as directed. Finish them even if you start to feel better.  Keep the infected arm or leg elevated to reduce swelling.  Apply a warm cloth to the affected area up to 4 times per day to relieve pain.  Only take over-the-counter or prescription medicines for pain, discomfort, or fever as directed by your caregiver.  Keep all follow-up appointments as directed by your caregiver. SEEK MEDICAL CARE IF:   You notice red streaks coming from the infected area.  Your red area gets larger or turns dark in color.  Your bone or joint underneath the infected area becomes painful after the skin has healed.  Your infection returns in the same area or another area.  You notice a swollen bump in the infected area.  You develop new symptoms. SEEK IMMEDIATE MEDICAL CARE IF:   You have a fever.  You feel very sleepy.  You develop vomiting or diarrhea.  You have a general ill feeling (malaise) with muscle aches and pains. MAKE SURE YOU:   Understand these instructions.  Will watch your condition.  Will get help right  away if you are not doing well or get worse. Document Released: 10/17/2004 Document Revised: 07/09/2011 Document Reviewed: 03/25/2011 [facility] Patient Information 2014 [place].

## 2013-05-30 NOTE — ED Notes (Signed)
Patient with no complaints at this time. Respirations even and unlabored. Skin warm/dry. Discharge instructions reviewed with patient at this time. Patient given opportunity to voice concerns/ask questions. Patient discharged at this time and left Emergency Department with steady gait.   

## 2013-05-30 NOTE — ED Notes (Signed)
Patient had L elbow bursal sac removal 08/2012.  Recently began to have pain and swelling at site.  Was treated w/antibx by Dr. Luna Glasgow and had excision of sinus drainage track on 05/26/2103. Has continued to have pain which has increased today.  States he could not stand the pressure of the sling on it.  States he takes maintenance pain medication which has not helped this pain.

## 2013-06-07 ENCOUNTER — Ambulatory Visit (HOSPITAL_COMMUNITY)
Admission: RE | Admit: 2013-06-07 | Discharge: 2013-06-07 | Disposition: A | Discharge status: Home / Self Care | Payer: Medicare PPO | Source: Ambulatory Visit | Attending: Orthopaedic Surgery | Admitting: Orthopaedic Surgery

## 2013-06-07 DIAGNOSIS — T8140XA Infection following a procedure, unspecified, initial encounter: Secondary | ICD-10-CM | POA: Insufficient documentation

## 2013-06-07 DIAGNOSIS — F17200 Nicotine dependence, unspecified, uncomplicated: Secondary | ICD-10-CM | POA: Insufficient documentation

## 2013-06-07 DIAGNOSIS — IMO0001 Reserved for inherently not codable concepts without codable children: Secondary | ICD-10-CM | POA: Insufficient documentation

## 2013-06-07 DIAGNOSIS — I10 Essential (primary) hypertension: Secondary | ICD-10-CM | POA: Insufficient documentation

## 2013-06-07 DIAGNOSIS — M25529 Pain in unspecified elbow: Secondary | ICD-10-CM | POA: Insufficient documentation

## 2013-06-07 NOTE — Progress Notes (Signed)
Physical Therapy - Wound Therapy  Evaluation   Patient Details  Name: [name] MRN: [id number] Date of Birth: [birth year]  Today's Date: 06/07/2013 Time: 4166-0630 Time Calculation (min): 45 min  Visit#: 1 of 12  Re-eval: 07/07/13  Subjective Subjective Assessment Subjective: PT states he has had two surgeries on his Lt elbow.  The first was approiximately 2 months ago for a rmoval of a cyst; the second was last Tuesday and he is unsure what that operation was for.  He states that on Saturday he had 10/10 pain and his elbow was draining green drainage so he came to the ER.  He was told to follow up with his MD who has now referred him to therapy for wound care. Patient and Family Stated Goals: wound to heal Date of Onset: 06/01/13 Prior Treatments: surgery   Pain Assessment Pain Assessment Pain Score: 10-Worst pain ever Pain Type: Surgical pain Pain Location: Elbow Pain Orientation: Left Pain Descriptors / Indicators: Burning Pain Onset: On-going Patients Stated Pain Goal: 2 Pain Intervention(s): Emotional support Multiple Pain Sites: No  Wound Therapy Wound / Incision (Open or Dehisced) 06/07/13 Incision - Dehisced Elbow Left (Active)  Dressing Type Alginate 06/07/2013  5:18 PM  Dressing Changed Changed 06/07/2013  5:18 PM  Dressing Status Dry 06/07/2013  5:18 PM  Dressing Change Frequency Monday, Wednesday, Friday 06/07/2013  5:18 PM  Site / Wound Assessment Pink;Yellow 06/07/2013  5:18 PM  % Wound base Red or Granulating 90% 06/07/2013  5:18 PM  % Wound base Yellow 10% 06/07/2013  5:18 PM  % Wound base Black 0% 06/07/2013  5:18 PM  % Wound base Other (Comment) 0% 06/07/2013  5:18 PM  Peri-wound Assessment Intact 06/07/2013  5:18 PM  Wound Length (cm) 0.8 cm 06/07/2013  5:18 PM  Wound Width (cm) 1 cm 06/07/2013  5:18 PM  Wound Depth (cm) 0.3 cm 06/07/2013  5:18 PM  Undermining (cm) 12:00 undermines 1.3 cm ; 3:0clock =1.2 cm; 6 0'clock =2.0 cm 9:00=2.7 cm (anatomical position.)  06/07/2013  5:18 PM  Margins Epibole (rolled edges) 06/07/2013  5:18 PM  Drainage Amount Moderate 06/07/2013  5:18 PM  Drainage Description Purulent 06/07/2013  5:18 PM  Treatment Hydrotherapy (Pulse lavage) 06/07/2013  5:18 PM     Incision (Closed) 05/25/13 Arm Left (Active)  Dressing Type Benzoine;Non adherent 05/25/2013 10:42 AM  Dressing Clean;Dry;Intact 05/25/2013 10:42 AM  Site / Wound Assessment Clean;Dry 05/25/2013 11:23 AM  Margins Attached edges (approximated) 05/25/2013 11:23 AM  Closure Approximated 05/25/2013 11:23 AM  Drainage Amount None 05/25/2013 11:23 AM  Treatment Ice applied 05/25/2013 10:42 AM       Physical Therapy Assessment and Plan Wound Therapy - Assess/Plan/Recommendations Wound Therapy - Clinical Statement: Pt is a 63 yo male who has had surgery on his Lt elbow.  The incision opened with significant drainage and pain.  He is now being referred to therapy to improve the wound bed enviornment to improve healing. Wound Therapy - Functional Problem List: non-healing wound Factors Delaying/Impairing Wound Healing: Infection - systemic/local;Tobacco use;Multiple medical problems;Polypharmacy Hydrotherapy Plan: Debridement;Dressing change;Patient/family education Wound Therapy - Frequency: 3X / week Wound Therapy - Current Recommendations: PT Wound Therapy - Follow Up Recommendations: [place] Wound Plan: Pt to recieve pulse lavage, mechanical debriedement and dressing changes.  Edges of wound where epibole is present will need to be debrided, educate on diet and smoking cessation.    Begin ROM exercises next treatment. (pt will need HEP)  Goals Wound Therapy Goals -  Improve the function of patient's integumentary system by progressing the wound(s) through the phases of wound healing by: Decrease Necrotic Tissue to: 0 Decrease Necrotic Tissue - Progress: Goal set today Increase Granulation Tissue to: 100 Increase Granulation Tissue - Progress: Goal set today Decrease  Length/Width/Depth by (cm): decrease undermining to no more than 1 cm in all directions. Decrease Length/Width/Depth - Progress: Goal set today Improve Drainage Characteristics: Min Improve Drainage Characteristics - Progress: Goal set today Patient/Family will be able to : verbalize the importance of smoking cessation in wound healing Additional Wound Therapy Goal: full ROM of elbow to be able to complete normal  ADL Additional Wound Therapy Goal - Progress: Goal set today Goals/treatment plan/discharge plan were made with and agreed upon by patient/family: Yes Time For Goal Achievement:  (4 weeks) Wound Therapy - Potential for Goals: Good  Problem List Patient Active Problem List   Diagnosis Date Noted  . Depression 04/05/2013  . Cardiac tamponade   . Aortic dissection   . HTN (hypertension)   . CAD (coronary artery disease)   . Hypercholesterolemia   . Chronic back pain   . Schatzki's ring     GP Functional Limitation: Other PT primary Other PT Primary Current Status (M4268): At least 40 percent but less than 60 percent impaired, limited or restricted Other PT Primary Goal Status (T4196): At least 1 percent but less than 20 percent impaired, limited or restricted  [name] 06/07/2013, 5:30 PM  Your signature is required to indicate approval of the treatment plan as stated above.  Please sign and return making a copy for your files.  You may hard copy or send electronically.  Please check one: ___1.  Approve of this plan  ___2.  Approve of this plan with the following changes.   ____________________________                             _____________ Physician                                                                      Date

## 2013-06-09 ENCOUNTER — Emergency Department (HOSPITAL_COMMUNITY): Payer: Medicare PPO

## 2013-06-09 ENCOUNTER — Other Ambulatory Visit: Payer: Self-pay

## 2013-06-09 ENCOUNTER — Encounter (HOSPITAL_COMMUNITY): Payer: Self-pay | Admitting: Emergency Medicine

## 2013-06-09 ENCOUNTER — Emergency Department (HOSPITAL_COMMUNITY)
Admission: EM | Admit: 2013-06-09 | Discharge: 2013-06-09 | Disposition: A | Discharge status: Home / Self Care | Payer: Medicare PPO | Attending: Emergency Medicine | Admitting: Emergency Medicine

## 2013-06-09 ENCOUNTER — Ambulatory Visit (HOSPITAL_COMMUNITY)
Admission: RE | Admit: 2013-06-09 | Discharge: 2013-06-09 | Disposition: A | Discharge status: Home / Self Care | Payer: Medicare PPO | Source: Ambulatory Visit | Attending: Gastroenterology | Admitting: Gastroenterology

## 2013-06-09 DIAGNOSIS — Z9889 Other specified postprocedural states: Secondary | ICD-10-CM | POA: Insufficient documentation

## 2013-06-09 DIAGNOSIS — G8929 Other chronic pain: Secondary | ICD-10-CM | POA: Insufficient documentation

## 2013-06-09 DIAGNOSIS — G8918 Other acute postprocedural pain: Secondary | ICD-10-CM | POA: Insufficient documentation

## 2013-06-09 DIAGNOSIS — E7800 Pure hypercholesterolemia, unspecified: Secondary | ICD-10-CM | POA: Insufficient documentation

## 2013-06-09 DIAGNOSIS — M545 Low back pain, unspecified: Secondary | ICD-10-CM | POA: Insufficient documentation

## 2013-06-09 DIAGNOSIS — M25529 Pain in unspecified elbow: Secondary | ICD-10-CM | POA: Insufficient documentation

## 2013-06-09 DIAGNOSIS — Z79899 Other long term (current) drug therapy: Secondary | ICD-10-CM | POA: Insufficient documentation

## 2013-06-09 DIAGNOSIS — M549 Dorsalgia, unspecified: Secondary | ICD-10-CM

## 2013-06-09 DIAGNOSIS — I10 Essential (primary) hypertension: Secondary | ICD-10-CM | POA: Insufficient documentation

## 2013-06-09 DIAGNOSIS — K279 Peptic ulcer, site unspecified, unspecified as acute or chronic, without hemorrhage or perforation: Secondary | ICD-10-CM | POA: Insufficient documentation

## 2013-06-09 DIAGNOSIS — Z792 Long term (current) use of antibiotics: Secondary | ICD-10-CM | POA: Insufficient documentation

## 2013-06-09 DIAGNOSIS — Z951 Presence of aortocoronary bypass graft: Secondary | ICD-10-CM | POA: Insufficient documentation

## 2013-06-09 DIAGNOSIS — F17200 Nicotine dependence, unspecified, uncomplicated: Secondary | ICD-10-CM | POA: Insufficient documentation

## 2013-06-09 DIAGNOSIS — I2510 Atherosclerotic heart disease of native coronary artery without angina pectoris: Secondary | ICD-10-CM | POA: Insufficient documentation

## 2013-06-09 LAB — CBC WITH DIFFERENTIAL/PLATELET
Basophils Absolute: 0 10*3/uL (ref 0.0–0.1)
Basophils Relative: 1 % (ref 0–1)
Eosinophils Absolute: 0.2 10*3/uL (ref 0.0–0.7)
Eosinophils Relative: 5 % (ref 0–5)
HCT: 35.1 % — ABNORMAL LOW (ref 39.0–52.0)
Hemoglobin: 11.9 g/dL — ABNORMAL LOW (ref 13.0–17.0)
Lymphocytes Relative: 25 % (ref 12–46)
Lymphs Abs: 1.2 10*3/uL (ref 0.7–4.0)
MCH: 31.9 pg (ref 26.0–34.0)
MCHC: 33.9 g/dL (ref 30.0–36.0)
MCV: 94.1 fL (ref 78.0–100.0)
MONO ABS: 0.3 10*3/uL (ref 0.1–1.0)
Monocytes Relative: 6 % (ref 3–12)
NEUTROS ABS: 3.2 10*3/uL (ref 1.7–7.7)
NEUTROS PCT: 63 % (ref 43–77)
Platelets: 182 10*3/uL (ref 150–400)
RBC: 3.73 MIL/uL — ABNORMAL LOW (ref 4.22–5.81)
RDW: 14.2 % (ref 11.5–15.5)
WBC: 5.1 10*3/uL (ref 4.0–10.5)

## 2013-06-09 LAB — COMPREHENSIVE METABOLIC PANEL
ALBUMIN: 3.8 g/dL (ref 3.5–5.2)
ALT: 8 U/L (ref 0–53)
AST: 19 U/L (ref 0–37)
Alkaline Phosphatase: 53 U/L (ref 39–117)
BUN: 12 mg/dL (ref 6–23)
CO2: 24 mEq/L (ref 19–32)
CREATININE: 0.9 mg/dL (ref 0.50–1.35)
Calcium: 9 mg/dL (ref 8.4–10.5)
Chloride: 104 mEq/L (ref 96–112)
GFR calc Af Amer: >90 mL/min (ref >90)
GFR calc non Af Amer: >90 mL/min (ref >90)
Glucose, Bld: 90 mg/dL (ref 70–99)
POTASSIUM: 3.8 meq/L (ref 3.7–5.3)
Sodium: 143 mEq/L (ref 137–147)
Total Bilirubin: 0.2 mg/dL — ABNORMAL LOW (ref 0.3–1.2)
Total Protein: 6.9 g/dL (ref 6.0–8.3)

## 2013-06-09 LAB — TROPONIN I: Troponin I: <0.3 ng/mL (ref <0.30)

## 2013-06-09 LAB — SEDIMENTATION RATE: SED RATE: 12 mm/h (ref 0–16)

## 2013-06-09 MED ORDER — FENTANYL 50 MCG/HR TD PT72
50.0000 ug | MEDICATED_PATCH | TRANSDERMAL | Status: DC
  Administered 2013-06-09: 50 ug via TRANSDERMAL
  Filled 2013-06-09: qty 1

## 2013-06-09 MED ORDER — IOHEXOL 350 MG/ML SOLN: 125.0000 mL | Freq: Once | INTRAVENOUS | Status: DC | PRN

## 2013-06-09 NOTE — ED Notes (Signed)
Pt from home. Pt c/o generalized pain to the back, hips, and left elbow. Pt states he had sx on his lt elbow last Tuesday for an infection. Pt states he loast 2 pain medication patches and believes that is why he is hurting so bad.

## 2013-06-09 NOTE — Progress Notes (Signed)
Physical Therapy - Wound Therapy  Treatment   Patient Details  Name: [name] MRN: [id number] Date of Birth: 01-11-1951  Today's Date: 06/09/2013 Time: 8469-6295 Time Calculation (min): 41 min Charge:  Selective debridement <20 cm  Visit#: 2 of 12  Re-eval: 07/07/13  Subjective Subjective Assessment Subjective: Pain scale 8-9/10 today, no pain medication  Pain Assessment Pain Assessment Pain Score: 9  Pain Location: Elbow Pain Orientation: Left  Wound Therapy  06/09/13 1339  Subjective Assessment  Subjective Pain scale 8-9/10 today, no pain medication  Wound / Incision (Open or Dehisced)  Date First Assessed: 06/07/13   Wound Type: Incision - Dehisced  Location: Elbow  Location Orientation: Left  Present on Admission: Yes  Dressing Type Alginate  Dressing Changed Changed  Dressing Status Dry  Site / Wound Assessment Pink;Yellow  % Wound base Red or Granulating 90%  % Wound base Yellow 10%  % Wound base Black 0%  % Wound base Other (Comment) 0%  Undermining (cm) 1200 undermines 1.3 cm, 3oclock = 1.2 cm, 6oclcok 2.0 cm and 900- 2.7cm anatomical position)  Margins Epibole (rolled edges)  Drainage Amount Moderate  Drainage Description Purulent  Treatment Hydrotherapy (Pulse lavage);Cleansed;Debridement (Selective)  Wound Therapy - Assess/Plan/Recommendations  Wound Therapy - Clinical Statement PLS and selective debridement complete per PT POC.  D/C PLS following this session.  Wound continues to show moderate drainage, calcium alginate for drainage with 4x4 and gauze for dressings.  Pt instructed ROM HEP exercises and able to demonstrate appropriate technique with all exercises.  Pt also given pink putty for grip strengthening with written insturctions.    Wound Plan Continue with selective debridement and appropriate dressings per PT POC.   Therex: Elbow flex and extend 10 reps Pronation and supinate 10 reps Given pink putty and demonstrated appropriate exercises per  written instructions  Physical Therapy Assessment and Plan Wound Therapy - Assess/Plan/Recommendations Wound Therapy - Clinical Statement: PLS and selective debridement complete per PT POC.  D/C PLS following this session.  Wound continues to show moderate drainage, calcium alginate for drainage with 4x4 and gauze for dressings.  Pt instructed ROM HEP exercises and able to demonstrate appropriate technique with all exercises.  Pt also given pink putty for grip strengthening with written insturctions.   Wound Plan: Continue with selective debridement and appropriate dressings per PT POC.      Goals    Problem List Patient Active Problem List   Diagnosis Date Noted  . Depression 04/05/2013  . Cardiac tamponade   . Aortic dissection   . HTN (hypertension)   . CAD (coronary artery disease)   . Hypercholesterolemia   . Chronic back pain   . Schatzki's ring     GP    Aldona Lento 06/09/2013, 3:53 PM

## 2013-06-09 NOTE — Discharge Instructions (Signed)
Back Pain, Adult You are leaving against medical advice because we have not addressed your aortic dissection. Follow up with Dr. Luna Glasgow. Return to the ED if you develop new or worsening symptoms. Low back pain is very common. About 1 in 5 people have back pain.The cause of low back pain is rarely dangerous. The pain often gets better over time.About half of people with a sudden onset of back pain feel better in just 2 weeks. About 8 in 10 people feel better by 6 weeks.  CAUSES Some common causes of back pain include:  Strain of the muscles or ligaments supporting the spine.  Wear and tear (degeneration) of the spinal discs.  Arthritis.  Direct injury to the back. DIAGNOSIS Most of the time, the direct cause of low back pain is not known.However, back pain can be treated effectively even when the exact cause of the pain is unknown.Answering your caregiver's questions about your overall health and symptoms is one of the most accurate ways to make sure the cause of your pain is not dangerous. If your caregiver needs more information, he or she may order lab work or imaging tests (X-rays or MRIs).However, even if imaging tests show changes in your back, this usually does not require surgery. HOME CARE INSTRUCTIONS For many people, back pain returns.Since low back pain is rarely dangerous, it is often a condition that people can learn to [facility] their own.   Remain active. It is stressful on the back to sit or stand in one place. Do not sit, drive, or stand in one place for more than 30 minutes at a time. Take short walks on level surfaces as soon as pain allows.Try to increase the length of time you walk each day.  Do not stay in bed.Resting more than 1 or 2 days can delay your recovery.  Do not avoid exercise or work.Your body is made to move.It is not dangerous to be active, even though your back may hurt.Your back will likely heal faster if you return to being active before your  pain is gone.  Pay attention to your body when you bend and lift. Many people have less discomfortwhen lifting if they bend their knees, keep the load close to their bodies,and avoid twisting. Often, the most comfortable positions are those that put less stress on your recovering back.  Find a comfortable position to sleep. Use a firm mattress and lie on your side with your knees slightly bent. If you lie on your back, put a pillow under your knees.  Only take over-the-counter or prescription medicines as directed by your caregiver. Over-the-counter medicines to reduce pain and inflammation are often the most helpful.Your caregiver may prescribe muscle relaxant drugs.These medicines help dull your pain so you can more quickly return to your normal activities and healthy exercise.  Put ice on the injured area.  Put ice in a plastic bag.  Place a towel between your skin and the bag.  Leave the ice on for 15-20 minutes, 03-04 times a day for the first 2 to 3 days. After that, ice and heat may be alternated to reduce pain and spasms.  Ask your caregiver about trying back exercises and gentle massage. This may be of some benefit.  Avoid feeling anxious or stressed.Stress increases muscle tension and can worsen back pain.It is important to recognize when you are anxious or stressed and learn ways to manage it.Exercise is a great option. SEEK MEDICAL CARE IF:  You have pain that is  not relieved with rest or medicine.  You have pain that does not improve in 1 week.  You have new symptoms.  You are generally not feeling well. SEEK IMMEDIATE MEDICAL CARE IF:   You have pain that radiates from your back into your legs.  You develop new bowel or bladder control problems.  You have unusual weakness or numbness in your arms or legs.  You develop nausea or vomiting.  You develop abdominal pain.  You feel faint. Document Released: 01/07/2005 Document Revised: 07/09/2011 Document  Reviewed: 05/28/2010 [facility] Patient Information 2014 [place], [place].

## 2013-06-09 NOTE — ED Provider Notes (Signed)
CSN: [id number]     Arrival date & time 06/09/13  1503 History   First MD Initiated Contact with Patient 06/09/13 1546     Chief Complaint  Patient presents with  . Pain     (Consider location/radiation/quality/duration/timing/severity/associated sxs/prior Treatment) HPI Comments: Patient reports pain in his L elbow that is worsening since having surgery on 5/5 for sinus tract excision.  He reports his fentanyl patches have been falling off because he has been sweating. He went to wound care and his elbow today and then came directly to the ER when he couldn't get a hold of his doctor. He denies any chest pain or shortness of breath. He complains of worsening chronic low back pain. No focal weakness, numbness or tingling. No bowel or bladder incontinence. No fevers or vomiting. He denies any drainage from his elbow. He is currently taking clindamycin as prescribed 5 days ago in the ED. He denies any significant drainage or bleeding from his wound. Notably he has a history of a chronic aortic dissection he says is not amenable to surgical repair.  The history is provided by the patient.    Past Medical History  Diagnosis Date  . Cardiac tamponade   . Aortic dissection     TYPE 3  . HTN (hypertension)   . CAD (coronary artery disease)   . Hypercholesterolemia   . Chronic back pain   . Hiatal hernia   . Schatzki's ring   . Chronic fatigue   . Chronic pain   . Peptic ulcer disease 08/2010    EGD    Past Surgical History  Procedure Laterality Date  . Sternal wire removeal  12/31/2007    [facility]  . Cabg x 6  07/31/2007    [facility]  . Cardiac catheterization    . Knee surgery Right     acl  . Shoulder surgery Left   . Orif scapular fracture Right   . Back surgery    . Olecranon bursectomy Left 08/27/2012    Procedure: EXCISION LEFT OLECRANON BURSA;  Surgeon: Sanjuana Kava, MD;  Location: [facility];  Service: Orthopedics;  Laterality: Left;  . Olecranon bursectomy Left 05/25/2013     Procedure:  Excision sinus tract and tissue left elbow ;  Surgeon: Sanjuana Kava, MD;  Location: [facility];  Service: Orthopedics;  Laterality: Left;   Family History  Problem Relation Age of Onset  . Heart disease Mother   . Heart disease Father    History  Substance Use Topics  . Smoking status: Current Some Day Smoker -- 0.50 packs/day for 50 years    Types: Cigarettes  . Smokeless tobacco: Never Used  . Alcohol Use: No    Review of Systems  Constitutional: Negative for fever, activity change and appetite change.  HENT: Negative for congestion and rhinorrhea.   Eyes: Negative for photophobia.  Respiratory: Negative for cough, chest tightness and shortness of breath.   Cardiovascular: Negative for chest pain.  Gastrointestinal: Negative for nausea, vomiting and abdominal pain.  Genitourinary: Negative for dysuria, hematuria and testicular pain.  Musculoskeletal: Positive for arthralgias and myalgias. Negative for back pain.  Skin: Negative for rash.  Neurological: Negative for dizziness, weakness and headaches.  Hematological: Negative for adenopathy.  Psychiatric/Behavioral: Negative for agitation.  A complete 10 system review of systems was obtained and all systems are negative except as noted in the HPI and PMH.      Allergies  Review of patient's allergies indicates no known allergies.  Home Medications  Prior to Admission medications   Medication Sig Start Date End Date Taking? Authorizing Provider  ezetimibe (ZETIA) 10 MG tablet Take 10 mg by mouth daily.   Yes Historical Provider, MD  isosorbide mononitrate (IMDUR) 60 MG 24 hr tablet Take 60 mg by mouth 2 (two) times daily.   Yes Historical Provider, MD  levofloxacin (LEVAQUIN) 500 MG tablet Take 500 mg by mouth daily. 06/04/13  Yes Historical Provider, MD  metoprolol tartrate (LOPRESSOR) 25 MG tablet Take 1 tablet (25 mg total) by mouth 2 (two) times daily. 12/28/12  Yes Candee Furbish, MD  Omega-3 Fatty Acids (FISH  OIL) 1000 MG CAPS Take 1 capsule by mouth daily.   Yes Historical Provider, MD  pantoprazole (PROTONIX) 40 MG tablet Take 40 mg by mouth 2 (two) times daily.  08/08/10  Yes Historical Provider, MD  rosuvastatin (CRESTOR) 20 MG tablet Take 20 mg by mouth daily.   Yes Historical Provider, MD  sertraline (ZOLOFT) 50 MG tablet Take 50 mg by mouth 2 (two) times daily.   Yes Historical Provider, MD  fentaNYL (DURAGESIC - DOSED MCG/HR) 50 MCG/HR Place 1 patch onto the skin every 3 (three) days.  09/04/10   Historical Provider, MD  nitroGLYCERIN (NITROSTAT) 0.4 MG SL tablet Place 0.4 mg under the tongue every 5 (five) minutes as needed. Chest pain    Historical Provider, MD   BP 176/99  Pulse 63  Temp(Src) 98.3 F (36.8 C) (Oral)  Resp 16  Ht 5\' 10"  (1.778 m)  Wt 155 lb (70.308 kg)  BMI 22.24 kg/m2  SpO2 98% Physical Exam  Constitutional: He is oriented to person, place, and time. He appears well-developed and well-nourished. No distress.  HENT:  Head: Normocephalic and atraumatic.  Mouth/Throat: Oropharynx is clear and moist. No oropharyngeal exudate.  Eyes: Conjunctivae and EOM are normal. Pupils are equal, round, and reactive to light.  Neck: Normal range of motion. Neck supple.  Cardiovascular: Normal rate, regular rhythm and normal heart sounds.   No murmur heard. Pulmonary/Chest: Effort normal and breath sounds normal. He has no wheezes.  Abdominal: Soft. There is no tenderness. There is no rebound and no guarding.  Musculoskeletal: Normal range of motion.  There is an open wound to the left elbow with packing in place. Mild surrounding erythema. Full range of motion. No effusion. Intact radial pulse \ 5/5 strength in bilateral lower extremities. Ankle plantar and dorsiflexion intact. Great toe extension intact bilaterally. +2 DP and PT pulses. +2 patellar reflexes bilaterally. Normal gait.   Neurological: He is oriented to person, place, and time. No cranial nerve deficit. He exhibits  normal muscle tone. Coordination normal.  Skin: Skin is warm.    ED Course  Procedures (including critical care time) Labs Review Labs Reviewed  CBC WITH DIFFERENTIAL - Abnormal; Notable for the following:    RBC 3.73 (*)    Hemoglobin 11.9 (*)    HCT 35.1 (*)    All other components within normal limits  COMPREHENSIVE METABOLIC PANEL - Abnormal; Notable for the following:    Total Bilirubin 0.2 (*)    All other components within normal limits  TROPONIN I  SEDIMENTATION RATE    Imaging Review No results found.   EKG Interpretation None      MDM   Final diagnoses:  Postoperative pain  Back pain   Patient reports worsening of his chronic pain in his back and his left elbow after he had surgery. No fevers. He is on clindamycin currently. He states  his Duragesic patches has not been staying on him because of his sweating.  Duragesic patch will be given here but he will not be prescribed additional ones. He is aware that the ED cannot prescribe chronic pain medications.   Given his ongoing back pain, Planned to obtain CT angiogram imaging of patient's chest and abdomen to evaluate stability of aortic dissection.  Regarding his elbow, the wound appears to be clean and healing adequately. Call to Dr. Luna Glasgow has not been returned. He will continue to take his antibiotics.  Patient stating he needs to leave to go home and take care of his wife. He understands that his workup is not complete. He is alert and oriented x3 and capable of making his own medical decisions and leave [place]. He understands the risks of possible death if his aortic dissection is worsening.   Ezequiel Essex, MD 06/09/13 [phone]

## 2013-06-11 ENCOUNTER — Ambulatory Visit (HOSPITAL_COMMUNITY)
Admission: RE | Admit: 2013-06-11 | Discharge: 2013-06-11 | Disposition: A | Discharge status: Home / Self Care | Payer: Medicare PPO | Source: Ambulatory Visit | Attending: Gastroenterology | Admitting: Gastroenterology

## 2013-06-11 NOTE — Progress Notes (Addendum)
Physical Therapy - Wound Therapy  Treatment   Patient Details  Name: [name] MRN: [id number] Date of Birth: [birth year]  Today's Date: 06/11/2013 Time: 1430-1500 Time Calculation (min): 30 min Charge:  debridment Visit#: 3 of 12  Re-eval: 07/07/13  Subjective Subjective Assessment Subjective: Pt states he saw the MD today who feels his wound is healing Date of Onset: 06/01/13 Prior Treatments: surgery   Pain Assessment Pain Assessment Pain Score: 7  Pain Location: Elbow Pain Orientation: Left Pain Descriptors / Indicators: Constant;Aching Pain Onset: On-going Pain Intervention(s): Emotional support  Wound Therapy Wound / Incision (Open or Dehisced) 06/07/13 Incision - Dehisced Elbow Left (Active)  Dressing Type Alginate 06/09/2013  1:39 PM  Dressing Changed Changed 06/11/2013  4:39 PM  Dressing Status Old drainage 06/11/2013  4:39 PM  Dressing Change Frequency Monday, Wednesday, Friday 06/11/2013  4:39 PM  Site / Wound Assessment Pink;Yellow 06/11/2013  4:39 PM  % Wound base Red or Granulating 75% 06/11/2013  4:39 PM  % Wound base Yellow 25% 06/11/2013  4:39 PM  % Wound base Black 0% 06/09/2013  1:39 PM  % Wound base Other (Comment) 0% 06/09/2013  1:39 PM  Peri-wound Assessment Intact 06/11/2013  4:39 PM  Wound Length (cm) 0.8 cm 06/07/2013  5:18 PM  Wound Width (cm) 1 cm 06/07/2013  5:18 PM  Wound Depth (cm) 0.3 cm 06/07/2013  5:18 PM  Undermining (cm) 1200 undermines 1.3 cm, 3oclock = 1.2 cm, 6oclcok 2.0 cm and 900- 2.7cm anatomical position) 06/09/2013  1:39 PM  Margins Epibole (rolled edges) 06/11/2013  4:39 PM  Closure None 06/11/2013  4:39 PM  Drainage Amount Moderate 06/11/2013  4:39 PM  Drainage Description Green 06/11/2013  4:39 PM  Non-staged Wound Description Full thickness 06/11/2013  4:39 PM  Treatment Cleansed;Debridement (Selective) 06/11/2013  4:39 PM     Incision (Closed) 05/25/13 Arm Left (Active)  Dressing Type Benzoine;Non adherent 05/25/2013 10:42 AM  Dressing  Clean;Dry;Intact 05/25/2013 10:42 AM  Site / Wound Assessment Clean;Dry 05/25/2013 11:23 AM  Margins Attached edges (approximated) 05/25/2013 11:23 AM  Closure Approximated 05/25/2013 11:23 AM  Drainage Amount None 05/25/2013 11:23 AM  Treatment Ice applied 05/25/2013 10:42 AM   Selective Debridement Selective Debridement - Location: wound bed Selective Debridement - Tools Used: Forceps Selective Debridement - Tissue Removed: slough   Physical Therapy Assessment and Plan Wound Therapy - Assess/Plan/Recommendations Wound Therapy - Clinical Statement: Wound is difficult to irrigate with PL.  Therapist used CarraKlenz in wound bed with gentle rubbing then q-tip to undermining area.  Wound Plan: begin bulb syringe cleansing with saline, (warm saline if able prior to cleansing).  Assess drainage.  If drainage is green stay with iodoform,  if clear may chance back to calcium alginate.       Goals    Problem List Patient Active Problem List   Diagnosis Date Noted  . Depression 04/05/2013  . Cardiac tamponade   . Aortic dissection   . HTN (hypertension)   . CAD (coronary artery disease)   . Hypercholesterolemia   . Chronic back pain   . Schatzki's ring     GP    [name] 06/11/2013, 4:46 PM

## 2013-06-15 ENCOUNTER — Ambulatory Visit (HOSPITAL_COMMUNITY)
Admission: RE | Admit: 2013-06-15 | Discharge: 2013-06-15 | Disposition: A | Discharge status: Home / Self Care | Payer: Medicare PPO | Source: Ambulatory Visit | Attending: Orthopaedic Surgery | Admitting: Orthopaedic Surgery

## 2013-06-15 NOTE — Progress Notes (Signed)
Physical Therapy - Wound Therapy  Treatment   Patient Details  Name: [name] MRN: [id number] Date of Birth: 1950/10/29  Today's Date: 06/15/2013 Time: 1520-1550 Time Calculation (min): 30 min Charge: selective debridement <20 cm Visit#: 4 of 12  Re-eval: 07/07/13  Subjective Subjective Assessment Subjective: Pain remains high, has been take advil as MD suggested with no relief  Pain Assessment Pain Assessment Pain Score: 9  Pain Location: Elbow Pain Orientation: Left  Wound Therapy  06/15/13 1554  Subjective Assessment  Subjective Pain remains high, has been take advil as MD suggested with no relief  Wound / Incision (Open or Dehisced)  Date First Assessed: 06/07/13   Wound Type: Incision - Dehisced  Location: Elbow  Location Orientation: Left  Present on Admission: Yes  Dressing Type Other (Comment) (iodform )  Dressing Changed Changed  Dressing Status Old drainage  Site / Wound Assessment Pink;Yellow  % Wound base Red or Granulating 85%  % Wound base Yellow 15%  % Wound base Black 0%  Peri-wound Assessment Intact  Margins Epibole (rolled edges)  Closure None  Drainage Amount Moderate  Drainage Description Green  Non-staged Wound Description Full thickness  Treatment Cleansed;Debridement (Selective)  Selective Debridement  Selective Debridement - Location wound bed  Selective Debridement - Tools Used Forceps  Selective Debridement - Tissue Removed slough  Wound Therapy - Assess/Plan/Recommendations  Wound Therapy - Clinical Statement Utilized bulb syringe irrigiation kit with saline for wound debridement today.  Pt continues to be limited by pain with debridement and packing.  Continued with iodform packing whole wound bed.   Wound Plan Assess wound drainage next session.  Continue with iodform is drainageis green, change to calcium alginate if clear drainage.      Selective Debridement Selective Debridement - Location: wound bed Selective Debridement -  Tools Used: Forceps Selective Debridement - Tissue Removed: slough   Physical Therapy Assessment and Plan Wound Therapy - Assess/Plan/Recommendations Wound Therapy - Clinical Statement: Utilized bulb syringe irrigiation kit with saline for wound debridement today.  Pt continues to be limited by pain with debridement and packing.  Continued with iodform packing whole wound bed.  Wound Plan: Assess wound drainage next session.  Continue with iodform is drainageis green, change to calcium alginate if clear drainage.        Goals    Problem List Patient Active Problem List   Diagnosis Date Noted  . Depression 04/05/2013  . Cardiac tamponade   . Aortic dissection   . HTN (hypertension)   . CAD (coronary artery disease)   . Hypercholesterolemia   . Chronic back pain   . Schatzki's ring     GP    Tessie Eke [place] 06/15/2013, 4:03 PM

## 2013-06-17 ENCOUNTER — Ambulatory Visit (HOSPITAL_COMMUNITY)
Admission: RE | Admit: 2013-06-17 | Discharge: 2013-06-17 | Disposition: A | Discharge status: Home / Self Care | Payer: Medicare PPO | Source: Ambulatory Visit | Attending: Orthopaedic Surgery | Admitting: Orthopaedic Surgery

## 2013-06-17 NOTE — Progress Notes (Addendum)
Physical Therapy - Wound Therapy  Treatment   Patient Details  Name: [name] MRN: [id number] Date of Birth: [birth year]  Today's Date: 06/17/2013 Time: 1111-1140 Time Calculation (min): 29 min Visit#: 5 of 12  Re-eval: 07/07/13 Charges:  Deb <20cm  Subjective Subjective Assessment Subjective: Pt states he still is having most trouble sleeping.  Pain remains constant.  Pain Assessment Pain Assessment Pain Assessment: 0-10 Pain Score: 8   Wound Therapy Wound / Incision (Open or Dehisced) 06/07/13 Incision - Dehisced Elbow Left (Active)  Dressing Type Other (Comment);Alginate 06/17/2013 11:00 AM  Dressing Changed New 06/17/2013 11:00 AM  Dressing Status Clean;Dry;Intact 06/17/2013 11:00 AM  Dressing Change Frequency Monday, Wednesday, Friday 06/11/2013  4:39 PM  Site / Wound Assessment Pink;Yellow 06/17/2013 11:00 AM  % Wound base Red or Granulating 85% 06/17/2013 11:00 AM  % Wound base Yellow 15% 06/17/2013 11:00 AM  % Wound base Black 0% 06/17/2013 11:00 AM  % Wound base Other (Comment) 0% 06/09/2013  1:39 PM  Peri-wound Assessment Intact 06/17/2013 11:00 AM  Wound Length (cm) 0.8 cm 06/07/2013  5:18 PM  Wound Width (cm) 1 cm 06/07/2013  5:18 PM  Wound Depth (cm) 0.3 cm 06/07/2013  5:18 PM  Undermining (cm) present as measured on evaluation 06/17/2013 11:00 AM  Margins Unattached edges (unapproximated) 06/17/2013 11:00 AM  Closure None 06/17/2013 11:00 AM  Drainage Amount Moderate 06/17/2013 11:00 AM  Drainage Description Serous;No odor 06/17/2013 11:00 AM  Non-staged Wound Description Full thickness 06/15/2013  3:54 PM  Treatment Cleansed;Debridement (Selective) 06/17/2013 11:00 AM     Incision (Closed) 05/25/13 Arm Left (Active)   Selective Debridement Selective Debridement - Location: wound bed Selective Debridement - Tools Used: Forceps Selective Debridement - Tissue Removed: slough   Physical Therapy Assessment and Plan Wound Therapy - Assess/Plan/Recommendations Wound Therapy  - Clinical Statement: Able to debride perimeter of wound without c/o pain.  sloughy layer present that was easily debrided.  Noted undermining perimeter of wound.  Changed dressing to medihoney alginate to promote granulation of wound bed.  Pt tolerated treatment well.  Wound Plan: Assess improvements using medihoney alginate.        Problem List Patient Active Problem List   Diagnosis Date Noted  . Depression 04/05/2013  . Cardiac tamponade   . Aortic dissection   . HTN (hypertension)   . CAD (coronary artery disease)   . Hypercholesterolemia   . Chronic back pain   . Schatzki's ring      Teena Irani, PTA/CLT 06/17/2013, 12:02 PM

## 2013-06-21 ENCOUNTER — Ambulatory Visit (HOSPITAL_COMMUNITY)
Admission: RE | Admit: 2013-06-21 | Discharge: 2013-06-21 | Disposition: A | Discharge status: Home / Self Care | Payer: Medicare PPO | Source: Ambulatory Visit | Attending: Gastroenterology | Admitting: Gastroenterology

## 2013-06-21 DIAGNOSIS — M25529 Pain in unspecified elbow: Secondary | ICD-10-CM | POA: Insufficient documentation

## 2013-06-21 DIAGNOSIS — Y849 Medical procedure, unspecified as the cause of abnormal reaction of the patient, or of later complication, without mention of misadventure at the time of the procedure: Secondary | ICD-10-CM | POA: Insufficient documentation

## 2013-06-21 DIAGNOSIS — T8140XA Infection following a procedure, unspecified, initial encounter: Secondary | ICD-10-CM | POA: Insufficient documentation

## 2013-06-21 DIAGNOSIS — F17200 Nicotine dependence, unspecified, uncomplicated: Secondary | ICD-10-CM | POA: Insufficient documentation

## 2013-06-21 DIAGNOSIS — IMO0001 Reserved for inherently not codable concepts without codable children: Secondary | ICD-10-CM | POA: Insufficient documentation

## 2013-06-21 DIAGNOSIS — I10 Essential (primary) hypertension: Secondary | ICD-10-CM | POA: Insufficient documentation

## 2013-06-21 NOTE — Progress Notes (Signed)
Physical Therapy - Wound Therapy  Treatment   Patient Details  Name: [name] MRN: [id number] Date of Birth: [birth year]  Today's Date: 06/21/2013 Time: 2025-4270 Time Calculation (min): 25 min Charge: selective debridement <20 cm Visit#: 6 of 12  Re-eval: 07/07/13  Subjective Subjective Assessment Subjective: Pain remains high, pt stated pain patches have been falling off  Pain Assessment Pain Assessment Pain Score: 8  Pain Location: Elbow Pain Orientation: Left  Wound Therapy  06/21/13 1221  Subjective Assessment  Subjective Pain remains high, pt stated pain patches have been falling off  Wound / Incision (Open or Dehisced)  Date First Assessed: 06/07/13   Wound Type: Incision - Dehisced  Location: Elbow  Location Orientation: Left  Present on Admission: Yes  Dressing Type Other (Comment);Alginate;ABD (Medihoney alginate)  Dressing Changed New  Dressing Status Clean;Dry;Intact  Site / Wound Assessment Pink;Yellow  % Wound base Red or Granulating 90%  % Wound base Yellow 10%  Peri-wound Assessment Intact  Undermining (cm) Present:  12:00 1.0cm, 3:00 .8 cm, 6:00 .7 cm, 9:00  Margins Unattached edges (unapproximated)  Closure None  Drainage Amount Moderate  Drainage Description Serous;No odor  Treatment Cleansed;Debridement (Selective)  Selective Debridement  Selective Debridement - Location wound bed  Selective Debridement - Tools Used Forceps;Other (comment) (q-tips)  Selective Debridement - Tissue Removed slough  Wound Therapy - Assess/Plan/Recommendations  Wound Therapy - Clinical Statement Continued with medihoney alginate dressings due to positive results wtih change in dressing, added ABD pad for excess drainage.  Increased ease with removal of slough.  Measurements taken for undermining (see above).    Wound Plan Assess wound drainage next session.  Continue with iodform is drainageis green, change to calcium alginate if clear drainage.      Selective  Debridement Selective Debridement - Location: wound bed Selective Debridement - Tools Used: Forceps;Other (comment) (q-tips) Selective Debridement - Tissue Removed: slough   Physical Therapy Assessment and Plan Wound Therapy - Assess/Plan/Recommendations Wound Therapy - Clinical Statement: Continued with medihoney alginate dressings due to positive results wtih change in dressing, added ABD pad for excess drainage.  Increased ease with removal of slough.  Measurements taken for undermining (see above).   Wound Plan: Assess wound drainage next session.  Continue with iodform is drainageis green, change to calcium alginate if clear drainage.        Goals    Problem List Patient Active Problem List   Diagnosis Date Noted  . Depression 04/05/2013  . Cardiac tamponade   . Aortic dissection   . HTN (hypertension)   . CAD (coronary artery disease)   . Hypercholesterolemia   . Chronic back pain   . Schatzki's ring     GP    Tessie Eke Kaytlin Burklow 06/21/2013, 12:30 PM

## 2013-06-23 ENCOUNTER — Ambulatory Visit (HOSPITAL_COMMUNITY)
Admission: RE | Admit: 2013-06-23 | Discharge: 2013-06-23 | Disposition: A | Discharge status: Home / Self Care | Payer: Medicare PPO | Source: Ambulatory Visit | Attending: Gastroenterology | Admitting: Gastroenterology

## 2013-06-23 NOTE — Progress Notes (Signed)
Physical Therapy - Wound Therapy  Treatment   Patient Details  Name: [name] MRN: [id number] Date of Birth: Jul 23, 1950  Today's Date: 06/23/2013 Time: 4665-9935 Time Calculation (min): 30 min Visit#: 7 of 12  Re-eval: 07/07/13 Charges:  Suzi Roots <20cm  Subjective Subjective Assessment Subjective: pt reports pain superior Lt elbow of 8/10.  States he's on fentenyl patches and takes oxycodone for breakthrough pain.  Pt reports dressings are rubbing in the crease of his elbow.  Pain Assessment 8/10 Lt elbow Wound Therapy Wound / Incision (Open or Dehisced) 06/07/13 Incision - Dehisced Elbow Left (Active)  Dressing Type Other (Comment);Alginate;ABD 06/23/2013  1:00 PM  Dressing Changed New 06/21/2013 12:21 PM  Dressing Status Clean;Dry;Intact 06/23/2013  1:00 PM  Dressing Change Frequency Monday, Wednesday, Friday 06/11/2013  4:39 PM  Site / Wound Assessment Pink;Yellow 06/23/2013  1:00 PM  % Wound base Red or Granulating 90% 06/23/2013  1:00 PM  % Wound base Yellow 10% 06/23/2013  1:00 PM  % Wound base Black 0% 06/17/2013 11:00 AM  % Wound base Other (Comment) 0% 06/09/2013  1:39 PM  Peri-wound Assessment Intact 06/23/2013  1:00 PM  Wound Length (cm) 0.8 cm 06/07/2013  5:18 PM  Wound Width (cm) 1 cm 06/07/2013  5:18 PM  Wound Depth (cm) 0.3 cm 06/07/2013  5:18 PM  Undermining (cm) Present:  12:00 1.0cm, 3:00 .8 cm, 6:00 .7 cm, 9:00 06/21/2013 12:21 PM  Margins Unattached edges (unapproximated) 06/23/2013  1:00 PM  Closure None 06/23/2013  1:00 PM  Drainage Amount Moderate 06/23/2013  1:00 PM  Drainage Description Serous;No odor 06/23/2013  1:00 PM  Non-staged Wound Description Full thickness 06/15/2013  3:54 PM  Treatment Cleansed;Debridement (Selective) 06/23/2013  1:00 PM     Incision (Closed) 05/25/13 Arm Left (Active)   Selective Debridement Selective Debridement - Location: wound bed and edges of wound Selective Debridement - Tools Used: Forceps;Other (comment) (q-tips) Selective Debridement - Tissue  Removed: slough   Physical Therapy Assessment and Plan Wound Therapy - Assess/Plan/Recommendations Wound Therapy - Clinical Statement: Noted reduction of swelling perimeter of wound.  continues to have undermining perimeter of wound.  Continued with medihoney alginate dressing due to positive results.  Used medipore tape today to secure dressing with 3# netting to secure due to irritation with bulkier dressings.     Wound Plan: Continue with current  woundcare.         Problem List Patient Active Problem List   Diagnosis Date Noted  . Depression 04/05/2013  . Cardiac tamponade   . Aortic dissection   . HTN (hypertension)   . CAD (coronary artery disease)   . Hypercholesterolemia   . Chronic back pain   . Schatzki's ring       Teena Irani, PTA/CLT 06/23/2013, 1:41 PM

## 2013-06-25 ENCOUNTER — Ambulatory Visit (HOSPITAL_COMMUNITY)
Admission: RE | Admit: 2013-06-25 | Discharge: 2013-06-25 | Disposition: A | Discharge status: Home / Self Care | Payer: Medicare PPO | Source: Ambulatory Visit | Attending: Gastroenterology | Admitting: Gastroenterology

## 2013-06-25 NOTE — Progress Notes (Addendum)
Physical Therapy - Wound Therapy  Treatment   Patient Details  Name: [name] MRN: [id number] Date of Birth: 1950/03/21  Today's Date: 06/25/2013 Time: 1110-1145 Time Calculation (min): 35 min Charge:  Debridement  Visit#: 8 of 12  Re-eval: 07/07/13  Subjective Subjective Assessment Subjective: Pt states he has lost he fentenyl patches.  Pt states he is continuing to smoke and he is not eating very well. Patient and Family Stated Goals: wound to heal Date of Onset: 06/01/13  Pain Assessment Pain Assessment Pain Score: 7  Pain Type: Acute pain Pain Location: Elbow Pain Orientation: Left Pain Descriptors / Indicators: Aching  Wound Therapy Wound / Incision (Open or Dehisced) 06/07/13 Incision - Dehisced Elbow Left (Active)  Dressing Type Other (Comment) 06/25/2013 12:21 PM  Dressing Changed Changed 06/25/2013 12:21 PM  Dressing Status New drainage 06/25/2013 12:21 PM  Dressing Change Frequency Monday, Wednesday, Friday 06/25/2013 12:21 PM  Site / Wound Assessment Pink;Yellow 06/25/2013 12:21 PM  % Wound base Red or Granulating 90% 06/25/2013 12:21 PM  % Wound base Yellow 10% 06/25/2013 12:21 PM  % Wound base Black 0% 06/17/2013 11:00 AM  % Wound base Other (Comment) 0% 06/09/2013  1:39 PM  Peri-wound Assessment Intact 06/25/2013 12:21 PM  Wound Length (cm) 0.8 cm 06/25/2013 12:21 PM  Wound Width (cm) 1 cm 06/25/2013 12:21 PM  Wound Depth (cm) 0.3 cm 06/25/2013 12:21 PM  Undermining (cm) Undermining was measured on initial evaluation  at eval 12:00 was at 1.3 cm now 1.0cm;  3:00 was 1.2 cm now .8cm; 6:00 was 2.0 cm now .7cm and 9:00 was 2.0 cm 06/25/2013 12:21 PM  Margins Unattached edges (unapproximated) 06/25/2013 12:21 PM  Closure None 06/23/2013  1:00 PM  Drainage Amount Moderate 06/25/2013 12:21 PM  Drainage Description Green 06/25/2013 12:21 PM  Non-staged Wound Description Full thickness 06/25/2013 12:21 PM  Treatment Cleansed debrieded 06/25/2013 12:21 PM     Incision (Closed) 05/25/13 Arm Left  (Active)     *current measurement measured by Ihor Austin on 06/21/2013   Physical Therapy Assessment and Plan Wound Therapy - Assess/Plan/Recommendations Wound Therapy - Clinical Statement:  Pt wound is slow to heal due to CAD, smoking and pt not eating enough protien or calories.  Pt informed of the need to begin counting calories pt needs to be ingesting at least 2100-2300; informed of the importance of smoking cessation.  Pt has green drainage returned to iodoform when drainage is no longer green may return to honey. Factors Delaying/Impairing Wound Healing: Tobacco use;Vascular compromise Wound Plan: ask pt if he counted his calories over the weekend.      Goals    Problem List Patient Active Problem List   Diagnosis Date Noted  . Depression 04/05/2013  . Cardiac tamponade   . Aortic dissection   . HTN (hypertension)   . CAD (coronary artery disease)   . Hypercholesterolemia   . Chronic back pain   . Schatzki's ring     GP    [name] 06/25/2013, 12:31 PM

## 2013-06-28 ENCOUNTER — Ambulatory Visit (HOSPITAL_COMMUNITY)
Admission: RE | Admit: 2013-06-28 | Discharge: 2013-06-28 | Disposition: A | Discharge status: Home / Self Care | Payer: Medicare PPO | Source: Ambulatory Visit | Attending: Orthopaedic Surgery | Admitting: Orthopaedic Surgery

## 2013-06-28 NOTE — Progress Notes (Signed)
Physical Therapy - Wound Therapy  Treatment   Patient Details  Name: [name] MRN: [id number] Date of Birth: 05/14/1950  Today's Date: 06/28/2013 Time: 1040-1105 Time Calculation (min): 25 min Visit#: 9 of 12  Re-eval: 07/07/13 Charges:  Deb<20cm  Subjective Subjective Assessment Subjective: Pt states it continues to hurt.  States he's going to require on getting suboxone to help get off the fentenyl.  Pt states he is now putting whey protein in his drinks and eating better to promote healing.     Wound Therapy Wound / Incision (Open or Dehisced) 06/07/13 Incision - Dehisced Elbow Left (Active)  Dressing Type Gauze (Comment);ABD 06/28/2013 11:00 AM  Dressing Changed Changed 06/25/2013 12:21 PM  Dressing Status New drainage 06/28/2013 11:00 AM  Dressing Change Frequency Monday, Wednesday, Friday 06/28/2013 11:00 AM  Site / Wound Assessment Pink;Yellow 06/28/2013 11:00 AM  % Wound base Red or Granulating 90% 06/28/2013 11:00 AM  % Wound base Yellow 10% 06/28/2013 11:00 AM  % Wound base Black 0% 06/17/2013 11:00 AM  % Wound base Other (Comment) 0% 06/09/2013  1:39 PM  Peri-wound Assessment Intact 06/28/2013 11:00 AM  Wound Length (cm) 0.8 cm 06/25/2013 12:21 PM  Wound Width (cm) 1 cm 06/25/2013 12:21 PM  Wound Depth (cm) 0.3 cm 06/25/2013 12:21 PM  Undermining (cm) Undermining was measured on initial evaluation  at eval 12:00 was at 1.3 cm now 1.0cm;  3:00 was 1.2 cm now .8cm; 6:00 was 2.0 cm now .7cm and 9:00 was 2.0 cm 06/25/2013 12:21 PM  Margins Unattached edges (unapproximated) 06/28/2013 11:00 AM  Closure None 06/28/2013 11:00 AM  Drainage Amount Moderate 06/28/2013 11:00 AM  Drainage Description Green 06/28/2013 11:00 AM  Non-staged Wound Description Full thickness 06/28/2013 11:00 AM  Treatment Irrigation;Debridement (Selective) 06/28/2013 11:00 AM     Incision (Closed) 05/25/13 Arm Left (Active)       Physical Therapy Assessment and Plan Wound Therapy - Assess/Plan/Recommendations Wound Therapy -  Clinical Statement: Drainage serous in color.  Iodoform packing fell out from wound.  Dampened 2" conform infused with medihoney and packed into perimeter of wound.  Secured with gauze and medipore tape to keep dressing contained and packed into wound.  Pt with another small hole forming inferior to primary wound.  anticipate perimeter of wound will need to be debrided in entirety as tissue is decreasing in integrity.  Wound Plan: Continue with current woundcare. G-code update due next visit.       Problem List Patient Active Problem List   Diagnosis Date Noted  . Depression 04/05/2013  . Cardiac tamponade   . Aortic dissection   . HTN (hypertension)   . CAD (coronary artery disease)   . Hypercholesterolemia   . Chronic back pain   . Schatzki's ring      Teena Irani, PTA/CLT 06/28/2013, 11:29 AM

## 2013-06-30 ENCOUNTER — Ambulatory Visit (HOSPITAL_COMMUNITY)
Admission: RE | Admit: 2013-06-30 | Discharge: 2013-06-30 | Disposition: A | Discharge status: Home / Self Care | Payer: Medicare PPO | Source: Ambulatory Visit | Attending: Physical Therapy | Admitting: Physical Therapy

## 2013-06-30 NOTE — Progress Notes (Signed)
Physical Therapy - Wound Therapy Discharge  Re-Evaluation   Patient Details  Name: [name] MRN: [id number] Date of Birth: 22-Sep-1950  Today's Date: 06/30/2013 Time: 8657-8469 Time Calculation (min): 18 min Visit#: 10 of 12  Re-eval: 07/07/13 Charges: self care 15 minutes  Subjective Subjective Assessment Subjective: Pt states it continues to hurt and has become unbearable with shooting nerve pain.  Pt returned to Dr. Luna Glasgow this morning. Pt states Dr. Luna Glasgow is referring him to an additional provider with the plan to remove the perimeter of skin where wound is undermining.  Pt reports he then wants his wound treated at a different wound clinic.   Pt comes today for last visit and requests all other visits be cancelled.  Pain Assessment 8/10 Lt elbow and shooting into UE Wound Therapy Wound / Incision (Open or Dehisced) 06/07/13 Incision - Dehisced Elbow Left (Active)  Dressing Type Gauze with medihoney gel, kerlix 06/30/2013 11:00 AM  Dressing Changed Changed 06/25/2013 12:21 PM  Dressing Status New drainage 06/30/2013 11:00 AM  Dressing Change Frequency Monday, Wednesday, Friday 06/30/2013 11:00 AM  Site / Wound Assessment Pink;Yellow 06/30/2013 11:00 AM  % Wound base Red or Granulating 70% 06/30/2013 11:00 AM  % Wound base Yellow 30% 06/30/2013 11:00 AM  % Wound base Black 0% 06/17/2013 11:00 AM  % Wound base Other (Comment) 0% 06/09/2013  1:39 PM  Peri-wound Assessment Intact 06/30/2013 11:00 AM  Wound Length (cm) 1 cm 06/30/2013 11:00 AM  Wound Width (cm) 1 cm 06/30/2013 11:00 AM  Wound Depth (cm) 0.3 cm 06/30/2013 11:00 AM  Undermining (cm) Undermining was measured on initial evaluation  at eval 12:00 was at 1.3 cm now 1.0cm;  3:00 was 1.2 cm now .8cm; 6:00 was 2.0 cm now .7cm and 9:00 was 2.0 cm 06/25/2013 12:21 PM  Margins Unattached edges (unapproximated) 06/30/2013 11:00 AM  Closure None 06/30/2013 11:00 AM  Drainage Amount Moderate 06/30/2013 11:00 AM  Drainage Description serous  06/30/2013 11:00 AM  Non-staged Wound Description N/a surgical wound 06/30/2013 11:00 AM  Treatment Irrigation, dressing change 06/28/2013 11:00 AM     Incision (Closed) 05/25/13 Arm Left (Active)       Physical Therapy Assessment and Plan Wound Therapy - Assess/Plan/Recommendations Wound Therapy - Clinical Statement: Drainage serous in color. Pt continues to have severe shooting pain in Lt UE.  Irrigated and redressed wound using medihoney gel.  Noted reduced integrity of skin perimeter of wound opening.  Pt to be referred to different provider per MD/patient.  Wound Plan: Discharge per MD orders as referring to another provider.      Goals Wound Therapy Goals - Improve the function of patient's integumentary system by progressing the wound(s) through the phases of wound healing by: Decrease Necrotic Tissue to: 0 Decrease Necrotic Tissue - Progress: Not met Increase Granulation Tissue to: 100 Increase Granulation Tissue - Progress: Not met Decrease Length/Width/Depth by (cm): decrease undermining to no more than 1 cm in all directions. Decrease Length/Width/Depth - Progress: Not met Improve Drainage Characteristics: Min Improve Drainage Characteristics - Progress: Not met Patient/Family will be able to : verbalize the importance of smoking cessation in wound healing Patient/Family Instruction Goal - Progress: Not met Additional Wound Therapy Goal: full ROM of elbow to be able to complete normal  ADL Additional Wound Therapy Goal - Progress: Not met Goals/treatment plan/discharge plan were made with and agreed upon by patient/family: Yes Time For Goal Achievement:  (4 weeks) Wound Therapy - Potential for Goals: Good  Problem List  Patient Active Problem List   Diagnosis Date Noted  . Depression 04/05/2013  . Cardiac tamponade   . Aortic dissection   . HTN (hypertension)   . CAD (coronary artery disease)   . Hypercholesterolemia   . Chronic back pain   . Schatzki's ring      GP Functional Limitation: Other PT primary Other PT Primary Goal Status ([id number]): At least 1 percent but less than 20 percent impaired, limited or restricted Other PT Primary Discharge Status [phone]): At least 40 percent but less than 60 percent impaired, limited or restricted  Teena Irani, PTA/CLT 06/30/2013, 11:35 AM

## 2013-07-01 ENCOUNTER — Emergency Department (HOSPITAL_COMMUNITY)
Admission: EM | Admit: 2013-07-01 | Discharge: 2013-07-01 | Disposition: A | Discharge status: Home / Self Care | Payer: Medicare PPO | Attending: Emergency Medicine | Admitting: Emergency Medicine

## 2013-07-01 ENCOUNTER — Encounter (HOSPITAL_COMMUNITY): Payer: Self-pay | Admitting: Emergency Medicine

## 2013-07-01 DIAGNOSIS — Z951 Presence of aortocoronary bypass graft: Secondary | ICD-10-CM | POA: Insufficient documentation

## 2013-07-01 DIAGNOSIS — E7800 Pure hypercholesterolemia, unspecified: Secondary | ICD-10-CM | POA: Insufficient documentation

## 2013-07-01 DIAGNOSIS — F17200 Nicotine dependence, unspecified, uncomplicated: Secondary | ICD-10-CM | POA: Insufficient documentation

## 2013-07-01 DIAGNOSIS — G8929 Other chronic pain: Secondary | ICD-10-CM | POA: Insufficient documentation

## 2013-07-01 DIAGNOSIS — Z9889 Other specified postprocedural states: Secondary | ICD-10-CM | POA: Insufficient documentation

## 2013-07-01 DIAGNOSIS — G8918 Other acute postprocedural pain: Secondary | ICD-10-CM | POA: Insufficient documentation

## 2013-07-01 DIAGNOSIS — I2510 Atherosclerotic heart disease of native coronary artery without angina pectoris: Secondary | ICD-10-CM | POA: Insufficient documentation

## 2013-07-01 DIAGNOSIS — Z792 Long term (current) use of antibiotics: Secondary | ICD-10-CM | POA: Insufficient documentation

## 2013-07-01 DIAGNOSIS — Z8719 Personal history of other diseases of the digestive system: Secondary | ICD-10-CM | POA: Insufficient documentation

## 2013-07-01 DIAGNOSIS — I10 Essential (primary) hypertension: Secondary | ICD-10-CM | POA: Insufficient documentation

## 2013-07-01 DIAGNOSIS — Z8775 Personal history of (corrected) congenital malformations of respiratory system: Secondary | ICD-10-CM | POA: Insufficient documentation

## 2013-07-01 DIAGNOSIS — Z79899 Other long term (current) drug therapy: Secondary | ICD-10-CM | POA: Insufficient documentation

## 2013-07-01 MED ORDER — KETOROLAC TROMETHAMINE 60 MG/2ML IM SOLN
60.0000 mg | Freq: Once | INTRAMUSCULAR | Status: AC
  Administered 2013-07-01: 60 mg via INTRAMUSCULAR
  Filled 2013-07-01: qty 2

## 2013-07-01 NOTE — ED Provider Notes (Signed)
CSN: [id number]     Arrival date & time 07/01/13  0119 History   First MD Initiated Contact with Patient 07/01/13 0130     Chief Complaint  Patient presents with  . Wound Check     (Consider location/radiation/quality/duration/timing/severity/associated sxs/prior Treatment) HPI This patient has chronic left elbow pain for the past 4 months since having surgery by Dr. Luna Glasgow. He had recent revision and has been going to wound care. He takes oxycodone and fentanyl patches at home. He states he does have a high tolerance for narcotic pain medication. He's had no fevers or chills. He is here tonight because he was having difficulty sleeping. He was seen in the wound clinic yesterday and had the wound evaluated. I have reviewed their findings. The wound had mild serous drainage with no evidence of infection. The wound was re-dressed. He's had no further drainage from the site. The dressing is intact. He's had no numbness or weakness. Past Medical History  Diagnosis Date  . Cardiac tamponade   . Aortic dissection     TYPE 3  . HTN (hypertension)   . CAD (coronary artery disease)   . Hypercholesterolemia   . Chronic back pain   . Hiatal hernia   . Schatzki's ring   . Chronic fatigue   . Chronic pain   . Peptic ulcer disease 08/2010    EGD    Past Surgical History  Procedure Laterality Date  . Sternal wire removeal  12/31/2007    [facility]  . Cabg x 6  07/31/2007    [facility]  . Cardiac catheterization    . Knee surgery Right     acl  . Shoulder surgery Left   . Orif scapular fracture Right   . Back surgery    . Olecranon bursectomy Left 08/27/2012    Procedure: EXCISION LEFT OLECRANON BURSA;  Surgeon: Sanjuana Kava, MD;  Location: [facility];  Service: Orthopedics;  Laterality: Left;  . Olecranon bursectomy Left 05/25/2013    Procedure:  Excision sinus tract and tissue left elbow ;  Surgeon: Sanjuana Kava, MD;  Location: [facility];  Service: Orthopedics;  Laterality: Left;   Family  History  Problem Relation Age of Onset  . Heart disease Mother   . Heart disease Father    History  Substance Use Topics  . Smoking status: Current Some Day Smoker -- 0.50 packs/day for 50 years    Types: Cigarettes  . Smokeless tobacco: Never Used  . Alcohol Use: No    Review of Systems  Constitutional: Negative for fever and chills.  Musculoskeletal: Positive for arthralgias.  Skin: Positive for wound. Negative for color change and rash.  Neurological: Negative for weakness and numbness.  All other systems reviewed and are negative.     Allergies  Review of patient's allergies indicates no known allergies.  Home Medications   Prior to Admission medications   Medication Sig Start Date End Date Taking? Authorizing Provider  ezetimibe (ZETIA) 10 MG tablet Take 10 mg by mouth daily.    Historical Provider, MD  fentaNYL (DURAGESIC - DOSED MCG/HR) 50 MCG/HR Place 1 patch onto the skin every 3 (three) days.  09/04/10   Historical Provider, MD  isosorbide mononitrate (IMDUR) 60 MG 24 hr tablet Take 60 mg by mouth 2 (two) times daily.    Historical Provider, MD  levofloxacin (LEVAQUIN) 500 MG tablet Take 500 mg by mouth daily. 06/04/13   Historical Provider, MD  metoprolol tartrate (LOPRESSOR) 25 MG tablet Take 1 tablet (25  mg total) by mouth 2 (two) times daily. 12/28/12   Candee Furbish, MD  nitroGLYCERIN (NITROSTAT) 0.4 MG SL tablet Place 0.4 mg under the tongue every 5 (five) minutes as needed. Chest pain    Historical Provider, MD  Omega-3 Fatty Acids (FISH OIL) 1000 MG CAPS Take 1 capsule by mouth daily.    Historical Provider, MD  pantoprazole (PROTONIX) 40 MG tablet Take 40 mg by mouth 2 (two) times daily.  08/08/10   Historical Provider, MD  rosuvastatin (CRESTOR) 20 MG tablet Take 20 mg by mouth daily.    Historical Provider, MD  sertraline (ZOLOFT) 50 MG tablet Take 50 mg by mouth 2 (two) times daily.    Historical Provider, MD   BP 136/90  Pulse 66  Temp(Src) 97.4 F (36.3  C) (Oral)  Resp 18  Ht 5\' 10"  (1.778 m)  Wt 143 lb (64.864 kg)  BMI 20.52 kg/m2  SpO2 96% Physical Exam  Nursing note and vitals reviewed. Constitutional: He is oriented to person, place, and time. He appears well-developed and well-nourished. No distress.  Patient joking and in no apparent distress  HENT:  Head: Normocephalic and atraumatic.  Eyes: EOM are normal. Pupils are equal, round, and reactive to light.  Neck: Normal range of motion. Neck supple.  Cardiovascular: Normal rate.   Pulmonary/Chest: Effort normal and breath sounds normal.  Abdominal: Soft.  Musculoskeletal: Normal range of motion. He exhibits tenderness. He exhibits no edema.  Full range of motion of the left elbow. The dressing is clean and fully intact. There is mild tenderness to palpation over the olecranon process. Distal pulses are intact. There is no obvious swelling or deformity.  Neurological: He is alert and oriented to person, place, and time.  5/5 grip strength bilaterally. Sensation fully intact.  Skin: Skin is warm and dry. No rash noted. No erythema.  Psychiatric: He has a normal mood and affect. His behavior is normal.    ED Course  Procedures (including critical care time) Labs Review Labs Reviewed - No data to display  Imaging Review No results found.   EKG Interpretation None      MDM   Final diagnoses:  Post-operative pain    Given the recent evaluation by wound care and access to their  physical exam findings which showed no evidence of infection, dressing was kept intact. Pain appears chronic with no acute changes. Patient is here because he is having difficulty sleeping. I've encouraged him to followup with his surgeon. We'll give IM dose of Toradol and discharg home. Return precautions given.    Julianne Rice, MD 07/01/13 [phone]

## 2013-07-01 NOTE — ED Notes (Signed)
Pt had surgery to left arm 4 months ago, continues to have pain in arm.

## 2013-07-01 NOTE — Discharge Instructions (Signed)
Followup with your surgeon regarding your ongoing left elbow pain. Return immediately to emergency department for fever, chills, persistent nausea or vomiting.

## 2013-07-02 ENCOUNTER — Ambulatory Visit (HOSPITAL_COMMUNITY): Payer: Medicare PPO | Admitting: Physical Therapy

## 2013-07-06 ENCOUNTER — Ambulatory Visit (HOSPITAL_COMMUNITY): Payer: Medicare PPO | Admitting: Physical Therapy

## 2013-07-08 ENCOUNTER — Ambulatory Visit (HOSPITAL_COMMUNITY): Payer: Medicare PPO | Admitting: Physical Therapy

## 2013-07-09 ENCOUNTER — Ambulatory Visit (HOSPITAL_COMMUNITY): Payer: Medicare PPO

## 2013-07-12 ENCOUNTER — Ambulatory Visit (HOSPITAL_COMMUNITY): Payer: Medicare PPO

## 2013-07-14 ENCOUNTER — Ambulatory Visit (HOSPITAL_COMMUNITY): Payer: Medicare PPO | Admitting: Physical Therapy

## 2013-07-16 ENCOUNTER — Ambulatory Visit (HOSPITAL_COMMUNITY): Payer: Medicare PPO | Admitting: Physical Therapy

## 2013-08-29 ENCOUNTER — Encounter (HOSPITAL_COMMUNITY): Payer: Self-pay | Admitting: Emergency Medicine

## 2013-08-29 ENCOUNTER — Emergency Department (HOSPITAL_COMMUNITY): Payer: Medicare PPO

## 2013-08-29 ENCOUNTER — Emergency Department (HOSPITAL_COMMUNITY)
Admission: EM | Admit: 2013-08-29 | Discharge: 2013-08-29 | Disposition: A | Discharge status: Home / Self Care | Payer: Medicare PPO | Attending: Emergency Medicine | Admitting: Emergency Medicine

## 2013-08-29 DIAGNOSIS — J438 Other emphysema: Secondary | ICD-10-CM | POA: Diagnosis not present

## 2013-08-29 DIAGNOSIS — G8929 Other chronic pain: Secondary | ICD-10-CM | POA: Insufficient documentation

## 2013-08-29 DIAGNOSIS — I10 Essential (primary) hypertension: Secondary | ICD-10-CM | POA: Insufficient documentation

## 2013-08-29 DIAGNOSIS — R0602 Shortness of breath: Secondary | ICD-10-CM | POA: Insufficient documentation

## 2013-08-29 DIAGNOSIS — Z792 Long term (current) use of antibiotics: Secondary | ICD-10-CM | POA: Diagnosis not present

## 2013-08-29 DIAGNOSIS — IMO0002 Reserved for concepts with insufficient information to code with codable children: Secondary | ICD-10-CM | POA: Insufficient documentation

## 2013-08-29 DIAGNOSIS — J439 Emphysema, unspecified: Secondary | ICD-10-CM

## 2013-08-29 DIAGNOSIS — Q391 Atresia of esophagus with tracheo-esophageal fistula: Secondary | ICD-10-CM | POA: Insufficient documentation

## 2013-08-29 DIAGNOSIS — Z79899 Other long term (current) drug therapy: Secondary | ICD-10-CM | POA: Insufficient documentation

## 2013-08-29 DIAGNOSIS — Z951 Presence of aortocoronary bypass graft: Secondary | ICD-10-CM | POA: Diagnosis not present

## 2013-08-29 DIAGNOSIS — E7800 Pure hypercholesterolemia, unspecified: Secondary | ICD-10-CM | POA: Diagnosis not present

## 2013-08-29 DIAGNOSIS — K279 Peptic ulcer, site unspecified, unspecified as acute or chronic, without hemorrhage or perforation: Secondary | ICD-10-CM | POA: Insufficient documentation

## 2013-08-29 DIAGNOSIS — Z9889 Other specified postprocedural states: Secondary | ICD-10-CM | POA: Diagnosis not present

## 2013-08-29 DIAGNOSIS — F17200 Nicotine dependence, unspecified, uncomplicated: Secondary | ICD-10-CM | POA: Insufficient documentation

## 2013-08-29 DIAGNOSIS — Q393 Congenital stenosis and stricture of esophagus: Secondary | ICD-10-CM | POA: Diagnosis not present

## 2013-08-29 DIAGNOSIS — I2510 Atherosclerotic heart disease of native coronary artery without angina pectoris: Secondary | ICD-10-CM | POA: Insufficient documentation

## 2013-08-29 MED ORDER — PREDNISONE 50 MG PO TABS: ORAL_TABLET | ORAL | Status: DC

## 2013-08-29 MED ORDER — ALBUTEROL SULFATE HFA 108 (90 BASE) MCG/ACT IN AERS
2.0000 | INHALATION_SPRAY | RESPIRATORY_TRACT | Status: DC | PRN
  Administered 2013-08-29: 2 via RESPIRATORY_TRACT
  Filled 2013-08-29: qty 6.7

## 2013-08-29 NOTE — Discharge Instructions (Signed)
Chronic Obstructive Pulmonary Disease Exacerbation  Chronic obstructive pulmonary disease (COPD) is a common lung problem. In COPD, the flow of air from the lungs is limited. COPD exacerbations are times that breathing gets worse and you need extra treatment. Without treatment they can be life threatening. If they happen often, your lungs can become more damaged. HOME CARE  Do not smoke.  Avoid tobacco smoke and other things that bother your lungs.  If given, take your antibiotic medicine as told. Finish the medicine even if you start to feel better.  Only take medicines as told by your doctor.  Drink enough fluids to keep your pee (urine) clear or pale yellow (unless your doctor has told you not to).  Use a cool mist machine (vaporizer).  If you use oxygen or a machine that turns liquid medicine into a mist (nebulizer), continue to use them as told.  Keep up with shots (vaccinations) as told by your doctor.  Exercise regularly.  Eat healthy foods.  Keep all doctor visits as told. GET HELP RIGHT AWAY IF:  You are very short of breath and it gets worse.  You have trouble talking.  You have bad chest pain.  You have blood in your spit (sputum).  You have a fever.  You keep throwing up (vomiting).  You feel weak, or you pass out (faint).  You feel confused.  You keep getting worse. MAKE SURE YOU:   Understand these instructions.  Will watch your condition.  Will get help right away if you are not doing well or get worse. Document Released: 12/27/2010 Document Revised: 10/28/2012 Document Reviewed: 09/11/2012 [facility] Patient Information 2015 [place], [place]. This information is not intended to replace advice given to you by your health care provider. Make sure you discuss any questions you have with your health care provider.  Stop smoking. Use your inhaler as needed. Prednisone for several days. Start tomorrow

## 2013-08-29 NOTE — ED Notes (Signed)
Sob since 3 am.  Productive cough.  Arrived via EMS.  Received 2 breathing treatments and 125 iv solumedrol.  Pt states he feels better at this time.

## 2013-08-29 NOTE — ED Provider Notes (Signed)
CSN: [id number]     Arrival date & time 08/29/13  0708 History  This chart was scribed for Jim Christen, MD by Ludger Nutting, ED Scribe. This patient was seen in room [facility]18[facility]18 and the patient's care was started 7:17 AM.    Chief Complaint  Patient presents with  . Shortness of Breath    The history is provided by the patient. No language interpreter was used.    HPI Comments: [name] is a 63 y.o. male who presents to the Emergency Department complaining of constant SOB that began PTA. Patient states he has intermittent SOB when physically active but states this particular episode began after a productive cough. Patient denies using oxygen, inhalers, or breathing treatments at home. Patient was given 2 breathing treatments and IV Solumedrol by EMS. Cigarette smoker. Severity is moderate. Symptoms have improved. No chest pain, fever, chills  PCP Earle Gell  Past Medical History  Diagnosis Date  . Cardiac tamponade   . Aortic dissection     TYPE 3  . HTN (hypertension)   . CAD (coronary artery disease)   . Hypercholesterolemia   . Chronic back pain   . Hiatal hernia   . Schatzki's ring   . Chronic fatigue   . Chronic pain   . Peptic ulcer disease 08/2010    EGD    Past Surgical History  Procedure Laterality Date  . Sternal wire removeal  12/31/2007    [facility]  . Cabg x 6  07/31/2007    HENDRICKSON  . Cardiac catheterization    . Knee surgery Right     acl  . Shoulder surgery Left   . Orif scapular fracture Right   . Back surgery    . Olecranon bursectomy Left 08/27/2012    Procedure: EXCISION LEFT OLECRANON BURSA;  Surgeon: Sanjuana Kava, MD;  Location: [facility];  Service: Orthopedics;  Laterality: Left;  . Olecranon bursectomy Left 05/25/2013    Procedure:  Excision sinus tract and tissue left elbow ;  Surgeon: Sanjuana Kava, MD;  Location: [facility];  Service: Orthopedics;  Laterality: Left;   Family History  Problem Relation Age of Onset  . Heart disease Mother   .  Heart disease Father    History  Substance Use Topics  . Smoking status: Current Some Day Smoker -- 0.50 packs/day for 50 years    Types: Cigarettes  . Smokeless tobacco: Never Used  . Alcohol Use: No    Review of Systems  A complete 10 system review of systems was obtained and all systems are negative except as noted in the HPI and PMH.    Allergies  Review of patient's allergies indicates no known allergies.  Home Medications   Prior to Admission medications   Medication Sig Start Date End Date Taking? Authorizing Provider  ezetimibe (ZETIA) 10 MG tablet Take 10 mg by mouth daily.    Historical Provider, MD  fentaNYL (DURAGESIC - DOSED MCG/HR) 50 MCG/HR Place 1 patch onto the skin every 3 (three) days.  09/04/10   Historical Provider, MD  isosorbide mononitrate (IMDUR) 60 MG 24 hr tablet Take 60 mg by mouth 2 (two) times daily.    Historical Provider, MD  levofloxacin (LEVAQUIN) 500 MG tablet Take 500 mg by mouth daily. 06/04/13   Historical Provider, MD  metoprolol tartrate (LOPRESSOR) 25 MG tablet Take 1 tablet (25 mg total) by mouth 2 (two) times daily. 12/28/12   Candee Furbish, MD  nitroGLYCERIN (NITROSTAT) 0.4 MG SL tablet Place 0.4  mg under the tongue every 5 (five) minutes as needed. Chest pain    Historical Provider, MD  Omega-3 Fatty Acids (FISH OIL) 1000 MG CAPS Take 1 capsule by mouth daily.    Historical Provider, MD  pantoprazole (PROTONIX) 40 MG tablet Take 40 mg by mouth 2 (two) times daily.  08/08/10   Historical Provider, MD  predniSONE (DELTASONE) 50 MG tablet 1 tablet daily for 4 days, one half tablet daily for 4 days.  Dispense 6 tablets 08/29/13   Jim Christen, MD  rosuvastatin (CRESTOR) 20 MG tablet Take 20 mg by mouth daily.    Historical Provider, MD  sertraline (ZOLOFT) 50 MG tablet Take 50 mg by mouth 2 (two) times daily.    Historical Provider, MD   BP 145/88  Pulse 61  Temp(Src) 98.4 F (36.9 C) (Oral)  Resp 16  Ht 5\' 10"  (1.778 m)  Wt 145 lb (65.772 kg)   BMI 20.81 kg/m2  SpO2 97% Physical Exam  Nursing note and vitals reviewed. Constitutional: He is oriented to person, place, and time. He appears well-developed and well-nourished.  HENT:  Head: Normocephalic and atraumatic.  Eyes: Conjunctivae and EOM are normal. Pupils are equal, round, and reactive to light.  Neck: Normal range of motion. Neck supple.  Cardiovascular: Normal rate, regular rhythm and normal heart sounds.   Pulmonary/Chest: Effort normal and breath sounds normal. No respiratory distress. He has no wheezes. He has no rales.  Abdominal: Soft. Bowel sounds are normal.  Musculoskeletal: Normal range of motion.  Neurological: He is alert and oriented to person, place, and time.  Skin: Skin is warm and dry.  Psychiatric: He has a normal mood and affect. His behavior is normal.    ED Course  Procedures (including critical care time)  DIAGNOSTIC STUDIES: Oxygen Saturation is 99% on 2 L/min, normal by my interpretation.    COORDINATION OF CARE: 7:22 AM Will order CXR and give albuterol inhaler and prednisone. Discussed treatment plan with pt at bedside and pt agreed to plan.   Labs Review Labs Reviewed - No data to display  Imaging Review Dg Chest 2 View  08/29/2013   CLINICAL DATA:  Shortness of breath.  EXAM: CHEST  2 VIEW  COMPARISON:  02/1913  FINDINGS: Two views of the chest demonstrate hyperinflation. Postsurgical changes consistent with prior CABG procedure. Heart size is within normal limits and stable. The lungs are clear without airspace disease or pulmonary edema. Mild degenerative changes in the thoracic spine.  IMPRESSION: No acute chest abnormalities.  Chronic hyperinflation.   Electronically Signed   By: Markus Daft M.D.   On: 08/29/2013 08:08     EKG Interpretation   Date/Time:  Sunday August 29 2013 07:12:50 EDT Ventricular Rate:  59 PR Interval:  155 QRS Duration: 95 QT Interval:  440 QTC Calculation: 436 R Axis:   95 Text Interpretation:  Sinus  rhythm Right axis deviation Probable lateral  infarct, old Confirmed by Karley Pho  MD, Gabriele Zwilling ([id number]) on 08/29/2013 7:45:11 AM      MDM   Final diagnoses:  Pulmonary emphysema, unspecified emphysema type   Patient has long-standing COPD.  He feels much better after 2 breathing treatments and IV site Medrol.   Discharge medications albuterol inhaler and prednisone. Stop smoking  I personally performed the services described in this documentation, which was scribed in my presence. The recorded information has been reviewed and is accurate.   Jim Christen, MD 08/29/13 [phone]

## 2013-08-29 NOTE — ED Notes (Signed)
Patient with no complaints at this time. Respirations even and unlabored. Skin warm/dry. Discharge instructions reviewed with patient at this time. Patient given opportunity to voice concerns/ask questions. IV removed per policy and band-aid applied to site. Patient discharged at this time and left Emergency Department with steady gait.  

## 2013-09-04 ENCOUNTER — Encounter (HOSPITAL_COMMUNITY): Payer: Self-pay | Admitting: Emergency Medicine

## 2013-09-04 ENCOUNTER — Emergency Department (HOSPITAL_COMMUNITY)
Admission: EM | Admit: 2013-09-04 | Discharge: 2013-09-04 | Disposition: A | Discharge status: Home / Self Care | Payer: Medicare PPO | Attending: Emergency Medicine | Admitting: Emergency Medicine

## 2013-09-04 DIAGNOSIS — M545 Low back pain, unspecified: Secondary | ICD-10-CM | POA: Diagnosis not present

## 2013-09-04 DIAGNOSIS — I2510 Atherosclerotic heart disease of native coronary artery without angina pectoris: Secondary | ICD-10-CM | POA: Diagnosis not present

## 2013-09-04 DIAGNOSIS — I10 Essential (primary) hypertension: Secondary | ICD-10-CM | POA: Insufficient documentation

## 2013-09-04 DIAGNOSIS — F17200 Nicotine dependence, unspecified, uncomplicated: Secondary | ICD-10-CM | POA: Diagnosis not present

## 2013-09-04 DIAGNOSIS — Z8719 Personal history of other diseases of the digestive system: Secondary | ICD-10-CM | POA: Diagnosis not present

## 2013-09-04 DIAGNOSIS — G8929 Other chronic pain: Secondary | ICD-10-CM | POA: Insufficient documentation

## 2013-09-04 DIAGNOSIS — Q391 Atresia of esophagus with tracheo-esophageal fistula: Secondary | ICD-10-CM | POA: Diagnosis not present

## 2013-09-04 DIAGNOSIS — Z792 Long term (current) use of antibiotics: Secondary | ICD-10-CM | POA: Insufficient documentation

## 2013-09-04 DIAGNOSIS — Z951 Presence of aortocoronary bypass graft: Secondary | ICD-10-CM | POA: Insufficient documentation

## 2013-09-04 DIAGNOSIS — E7800 Pure hypercholesterolemia, unspecified: Secondary | ICD-10-CM | POA: Insufficient documentation

## 2013-09-04 DIAGNOSIS — Z9889 Other specified postprocedural states: Secondary | ICD-10-CM | POA: Insufficient documentation

## 2013-09-04 DIAGNOSIS — Q393 Congenital stenosis and stricture of esophagus: Secondary | ICD-10-CM | POA: Diagnosis not present

## 2013-09-04 DIAGNOSIS — IMO0002 Reserved for concepts with insufficient information to code with codable children: Secondary | ICD-10-CM | POA: Diagnosis not present

## 2013-09-04 DIAGNOSIS — Z79899 Other long term (current) drug therapy: Secondary | ICD-10-CM | POA: Insufficient documentation

## 2013-09-04 DIAGNOSIS — M549 Dorsalgia, unspecified: Secondary | ICD-10-CM | POA: Diagnosis present

## 2013-09-04 MED ORDER — PANTOPRAZOLE SODIUM 20 MG PO TBEC: 20.0000 mg | DELAYED_RELEASE_TABLET | Freq: Every day | ORAL | Status: DC

## 2013-09-04 MED ORDER — KETOROLAC TROMETHAMINE 60 MG/2ML IM SOLN
60.0000 mg | Freq: Once | INTRAMUSCULAR | Status: AC
  Administered 2013-09-04: 60 mg via INTRAMUSCULAR
  Filled 2013-09-04: qty 2

## 2013-09-04 MED ORDER — PANTOPRAZOLE SODIUM 40 MG PO TBEC
40.0000 mg | DELAYED_RELEASE_TABLET | Freq: Once | ORAL | Status: AC
  Administered 2013-09-04: 40 mg via ORAL
  Filled 2013-09-04: qty 1

## 2013-09-04 NOTE — ED Notes (Signed)
Pt with chronic back problems, states his fentanyl patch will not stick to his skin in the hot weather and now has lost the most recent one he put on.  Pt states he cannot get another patch until Tuesday

## 2013-09-04 NOTE — Discharge Instructions (Signed)
Talk with Dr. Carloyn Manner about your problems with the Fentanyl Patch. Increase your Pantoprazole (Protonix) to twice a day. Take your Oxycodone as needed for pain.  Chronic Back Pain  When back pain lasts longer than 3 months, it is called chronic back pain.People with chronic back pain often go through certain periods that are more intense (flare-ups).  CAUSES Chronic back pain can be caused by wear and tear (degeneration) on different structures in your back. These structures include:  The bones of your spine (vertebrae) and the joints surrounding your spinal cord and nerve roots (facets).  The strong, fibrous tissues that connect your vertebrae (ligaments). Degeneration of these structures may result in pressure on your nerves. This can lead to constant pain. HOME CARE INSTRUCTIONS  Avoid bending, heavy lifting, prolonged sitting, and activities which make the problem worse.  Take brief periods of rest throughout the day to reduce your pain. Lying down or standing usually is better than sitting while you are resting.  Take over-the-counter or prescription medicines only as directed by your caregiver. SEEK IMMEDIATE MEDICAL CARE IF:   You have weakness or numbness in one of your legs or feet.  You have trouble controlling your bladder or bowels.  You have nausea, vomiting, abdominal pain, shortness of breath, or fainting. Document Released: 02/15/2004 Document Revised: 04/01/2011 Document Reviewed: 12/22/2010 [facility] Patient Information 2015 [place], [place]. This information is not intended to replace advice given to you by your health care provider. Make sure you discuss any questions you have with your health care provider.

## 2013-09-04 NOTE — ED Provider Notes (Signed)
CSN: [id number]     Arrival date & time 09/04/13  0039 History   First MD Initiated Contact with Patient 09/04/13 0053     Chief Complaint  Patient presents with  . Back Pain     (Consider location/radiation/quality/duration/timing/severity/associated sxs/prior Treatment) Patient is a 63 y.o. male presenting with back pain. The history is provided by the patient.  Back Pain He has chronic back pain and is on a fentanyl patch every 3 days. He states that the patches fall off because of sweating and did not last for 3 days they're supposed to. He has run out of his patches and cannot get another patch for at least another week. His last patch came off 3 days ago and is concerned about going into narcotic withdrawal. Has oxycodone at home but states that he has not been taking it because it upsets his ulcer. He is taking pantoprazole for ulcer and is taking it once a day. He denies any weakness or numbness and he denies any bowel or bladder dysfunction. He rates his pain at 9/10. Pain is worse with movement.  Past Medical History  Diagnosis Date  . Cardiac tamponade   . Aortic dissection     TYPE 3  . HTN (hypertension)   . CAD (coronary artery disease)   . Hypercholesterolemia   . Chronic back pain   . Hiatal hernia   . Schatzki's ring   . Chronic fatigue   . Chronic pain   . Peptic ulcer disease 08/2010    EGD    Past Surgical History  Procedure Laterality Date  . Sternal wire removeal  12/31/2007    [facility]  . Cabg x 6  07/31/2007    [facility]DRICKSON  . Cardiac catheterization    . Knee surgery Right     acl  . Shoulder surgery Left   . Orif scapular fracture Right   . Back surgery    . Olecranon bursectomy Left 08/27/2012    Procedure: EXCISION LEFT OLECRANON BURSA;  Surgeon: Sanjuana Kava, MD;  Location: [facility];  Service: Orthopedics;  Laterality: Left;  . Olecranon bursectomy Left 05/25/2013    Procedure:  Excision sinus tract and tissue left elbow ;  Surgeon: Sanjuana Kava,  MD;  Location: [facility];  Service: Orthopedics;  Laterality: Left;   Family History  Problem Relation Age of Onset  . Heart disease Mother   . Heart disease Father    History  Substance Use Topics  . Smoking status: Current Some Day Smoker -- 0.50 packs/day for 50 years    Types: Cigarettes  . Smokeless tobacco: Never Used  . Alcohol Use: No    Review of Systems  Musculoskeletal: Positive for back pain.  All other systems reviewed and are negative.     Allergies  Review of patient's allergies indicates no known allergies.  Home Medications   Prior to Admission medications   Medication Sig Start Date End Date Taking? Authorizing Provider  ezetimibe (ZETIA) 10 MG tablet Take 10 mg by mouth daily.   Yes Historical Provider, MD  fentaNYL (DURAGESIC - DOSED MCG/HR) 50 MCG/HR Place 1 patch onto the skin every 3 (three) days.  09/04/10  Yes Historical Provider, MD  isosorbide mononitrate (IMDUR) 60 MG 24 hr tablet Take 60 mg by mouth 2 (two) times daily.   Yes Historical Provider, MD  metoprolol tartrate (LOPRESSOR) 25 MG tablet Take 1 tablet (25 mg total) by mouth 2 (two) times daily. 12/28/12  Yes Candee Furbish, MD  nitroGLYCERIN (NITROSTAT) 0.4 MG SL tablet Place 0.4 mg under the tongue every 5 (five) minutes as needed. Chest pain   Yes Historical Provider, MD  Omega-3 Fatty Acids (FISH OIL) 1000 MG CAPS Take 1 capsule by mouth daily.   Yes Historical Provider, MD  pantoprazole (PROTONIX) 40 MG tablet Take 40 mg by mouth 2 (two) times daily.  08/08/10  Yes Historical Provider, MD  predniSONE (DELTASONE) 50 MG tablet 1 tablet daily for 4 days, one half tablet daily for 4 days.  Dispense 6 tablets 08/29/13  Yes [name], MD  rosuvastatin (CRESTOR) 20 MG tablet Take 20 mg by mouth daily.   Yes Historical Provider, MD  sertraline (ZOLOFT) 50 MG tablet Take 50 mg by mouth 2 (two) times daily.   Yes Historical Provider, MD  levofloxacin (LEVAQUIN) 500 MG tablet Take 500 mg by mouth daily.  06/04/13   Historical Provider, MD   BP 176/104  Pulse 61  Temp(Src) 98.1 F (36.7 C) (Oral)  Resp 16  SpO2 98% Physical Exam  Nursing note and vitals reviewed.  63 year old male, resting comfortably and in no acute distress. Vital signs are significant for hypertension. Oxygen saturation is 98%, which is normal. Head is normocephalic and atraumatic. PERRLA, EOMI. Oropharynx is clear. Neck is nontender and supple without adenopathy or JVD. Back is mildly to moderately tender in the mid and lower lumbar area. Straight leg raise is negative. There is no CVA tenderness. Lungs are clear without rales, wheezes, or rhonchi. Chest is nontender. Heart has regular rate and rhythm without murmur. Abdomen is soft, flat, nontender without masses or hepatosplenomegaly and peristalsis is normoactive. Extremities have no cyanosis or edema, full range of motion is present. Skin is warm and dry without rash. Neurologic: Mental status is normal, cranial nerves are intact, there are no motor or sensory deficits.  ED Course  Procedures (including critical care time)  MDM   Final diagnoses:  Chronic low back pain    Chronic low back pain. I reviewed his records and he has several ED visits with similar complaints. I reviewed his record on the [place] controlled substance reporting website and he had prescription for 150 oxycodone 30 mg tablets filled on July 25 and prescription for 10 fentanyl patches filled on August 6. I've informed him that he needs to have his narcotic medications addressed through his prescriber her. He is given injection of ketorolac. He is advised oxycodone should not impact his ulcer but recommended that he increase his pantoprazole to twice a day.    Delora Fuel, MD 16/10/96 0454

## 2013-09-29 ENCOUNTER — Other Ambulatory Visit: Payer: Self-pay

## 2013-09-29 MED ORDER — METOPROLOL TARTRATE 25 MG PO TABS: 25.0000 mg | ORAL_TABLET | Freq: Two times a day (BID) | ORAL | Status: DC

## 2013-10-04 ENCOUNTER — Institutional Professional Consult (permissible substitution): Payer: Medicare PPO | Admitting: Internal Medicine

## 2013-10-06 ENCOUNTER — Institutional Professional Consult (permissible substitution): Payer: Medicare PPO | Admitting: Internal Medicine

## 2013-12-28 ENCOUNTER — Other Ambulatory Visit: Payer: Self-pay | Admitting: *Deleted

## 2013-12-28 MED ORDER — ISOSORBIDE MONONITRATE ER 60 MG PO TB24: 60.0000 mg | ORAL_TABLET | Freq: Two times a day (BID) | ORAL | Status: DC

## 2014-01-02 ENCOUNTER — Other Ambulatory Visit: Payer: Self-pay | Admitting: Cardiology

## 2014-01-07 ENCOUNTER — Emergency Department (HOSPITAL_COMMUNITY)
Admission: EM | Admit: 2014-01-07 | Discharge: 2014-01-07 | Disposition: A | Discharge status: Home / Self Care | Payer: Medicare PPO | Attending: Emergency Medicine | Admitting: Emergency Medicine

## 2014-01-07 ENCOUNTER — Emergency Department (HOSPITAL_COMMUNITY): Payer: Medicare PPO

## 2014-01-07 ENCOUNTER — Encounter (HOSPITAL_COMMUNITY): Payer: Self-pay | Admitting: Emergency Medicine

## 2014-01-07 DIAGNOSIS — R0602 Shortness of breath: Secondary | ICD-10-CM

## 2014-01-07 DIAGNOSIS — I10 Essential (primary) hypertension: Secondary | ICD-10-CM | POA: Diagnosis not present

## 2014-01-07 DIAGNOSIS — Z7952 Long term (current) use of systemic steroids: Secondary | ICD-10-CM | POA: Diagnosis not present

## 2014-01-07 DIAGNOSIS — E780 Pure hypercholesterolemia: Secondary | ICD-10-CM | POA: Diagnosis not present

## 2014-01-07 DIAGNOSIS — Z792 Long term (current) use of antibiotics: Secondary | ICD-10-CM | POA: Insufficient documentation

## 2014-01-07 DIAGNOSIS — J441 Chronic obstructive pulmonary disease with (acute) exacerbation: Secondary | ICD-10-CM | POA: Insufficient documentation

## 2014-01-07 DIAGNOSIS — G8929 Other chronic pain: Secondary | ICD-10-CM | POA: Insufficient documentation

## 2014-01-07 DIAGNOSIS — Z720 Tobacco use: Secondary | ICD-10-CM | POA: Insufficient documentation

## 2014-01-07 DIAGNOSIS — I2510 Atherosclerotic heart disease of native coronary artery without angina pectoris: Secondary | ICD-10-CM | POA: Diagnosis not present

## 2014-01-07 DIAGNOSIS — Z8719 Personal history of other diseases of the digestive system: Secondary | ICD-10-CM | POA: Insufficient documentation

## 2014-01-07 DIAGNOSIS — Z79899 Other long term (current) drug therapy: Secondary | ICD-10-CM | POA: Diagnosis not present

## 2014-01-07 HISTORY — DX: Unspecified chronic bronchitis: J42

## 2014-01-07 HISTORY — DX: Chronic obstructive pulmonary disease, unspecified: J44.9

## 2014-01-07 LAB — BASIC METABOLIC PANEL
Anion gap: 12 (ref 5–15)
BUN: 10 mg/dL (ref 6–23)
CO2: 25 mEq/L (ref 19–32)
Calcium: 8.4 mg/dL (ref 8.4–10.5)
Chloride: 107 mEq/L (ref 96–112)
Creatinine, Ser: 0.7 mg/dL (ref 0.50–1.35)
GFR calc Af Amer: >90 mL/min (ref >90)
GFR calc non Af Amer: >90 mL/min (ref >90)
Glucose, Bld: 111 mg/dL — ABNORMAL HIGH (ref 70–99)
Potassium: 2.9 mEq/L — CL (ref 3.7–5.3)
Sodium: 144 mEq/L (ref 137–147)

## 2014-01-07 LAB — CBC WITH DIFFERENTIAL/PLATELET
Basophils Absolute: 0 10*3/uL (ref 0.0–0.1)
Basophils Relative: 0 % (ref 0–1)
Eosinophils Absolute: 0.1 10*3/uL (ref 0.0–0.7)
Eosinophils Relative: 1 % (ref 0–5)
HCT: 30.5 % — ABNORMAL LOW (ref 39.0–52.0)
Hemoglobin: 10.2 g/dL — ABNORMAL LOW (ref 13.0–17.0)
Lymphocytes Relative: 11 % — ABNORMAL LOW (ref 12–46)
Lymphs Abs: 0.8 10*3/uL (ref 0.7–4.0)
MCH: 32.8 pg (ref 26.0–34.0)
MCHC: 33.4 g/dL (ref 30.0–36.0)
MCV: 98.1 fL (ref 78.0–100.0)
Monocytes Absolute: 0.1 10*3/uL (ref 0.1–1.0)
Monocytes Relative: 2 % — ABNORMAL LOW (ref 3–12)
Neutro Abs: 5.7 10*3/uL (ref 1.7–7.7)
Neutrophils Relative %: 86 % — ABNORMAL HIGH (ref 43–77)
Platelets: 160 10*3/uL (ref 150–400)
RBC: 3.11 MIL/uL — ABNORMAL LOW (ref 4.22–5.81)
RDW: 15 % (ref 11.5–15.5)
WBC: 6.7 10*3/uL (ref 4.0–10.5)

## 2014-01-07 LAB — TROPONIN I: Troponin I: <0.3 ng/mL (ref <0.30)

## 2014-01-07 LAB — PRO B NATRIURETIC PEPTIDE: Pro B Natriuretic peptide (BNP): 5182 pg/mL — ABNORMAL HIGH (ref 0–125)

## 2014-01-07 MED ORDER — METHYLPREDNISOLONE SODIUM SUCC 125 MG IJ SOLR: 125.0000 mg | Freq: Once | INTRAMUSCULAR | Status: DC

## 2014-01-07 MED ORDER — POTASSIUM CHLORIDE 10 MEQ/100ML IV SOLN
10.0000 meq | Freq: Once | INTRAVENOUS | Status: AC
  Administered 2014-01-07: 10 meq via INTRAVENOUS
  Filled 2014-01-07: qty 100

## 2014-01-07 MED ORDER — PREDNISONE 50 MG PO TABS: ORAL_TABLET | ORAL | Status: DC

## 2014-01-07 MED ORDER — ALBUTEROL SULFATE HFA 108 (90 BASE) MCG/ACT IN AERS
2.0000 | INHALATION_SPRAY | RESPIRATORY_TRACT | Status: DC
  Administered 2014-01-07: 2 via RESPIRATORY_TRACT

## 2014-01-07 MED ORDER — FUROSEMIDE 20 MG PO TABS: 20.0000 mg | ORAL_TABLET | Freq: Every day | ORAL | Status: DC

## 2014-01-07 MED ORDER — FUROSEMIDE 10 MG/ML IJ SOLN
40.0000 mg | Freq: Once | INTRAMUSCULAR | Status: AC
  Administered 2014-01-07: 40 mg via INTRAVENOUS
  Filled 2014-01-07: qty 4

## 2014-01-07 MED ORDER — IPRATROPIUM-ALBUTEROL 0.5-2.5 (3) MG/3ML IN SOLN
3.0000 mL | Freq: Once | RESPIRATORY_TRACT | Status: AC
  Administered 2014-01-07: 3 mL via RESPIRATORY_TRACT
  Filled 2014-01-07: qty 3

## 2014-01-07 MED ORDER — POTASSIUM CHLORIDE CRYS ER 20 MEQ PO TBCR
60.0000 meq | EXTENDED_RELEASE_TABLET | Freq: Once | ORAL | Status: AC
  Administered 2014-01-07: 60 meq via ORAL
  Filled 2014-01-07: qty 3

## 2014-01-07 MED ORDER — ALBUTEROL (5 MG/ML) CONTINUOUS INHALATION SOLN
15.0000 mg/h | INHALATION_SOLUTION | RESPIRATORY_TRACT | Status: DC
  Administered 2014-01-07: 15 mg/h via RESPIRATORY_TRACT
  Filled 2014-01-07: qty 20

## 2014-01-07 MED ORDER — ALBUTEROL SULFATE HFA 108 (90 BASE) MCG/ACT IN AERS
INHALATION_SPRAY | RESPIRATORY_TRACT | Status: AC
  Filled 2014-01-07: qty 6.7

## 2014-01-07 NOTE — ED Notes (Addendum)
Patient complains of shortness of breath that started this morning around 0200. States history of COPD. Reports cough, not productive. Wheezing noted. States gets short of breath with little exertion.

## 2014-01-07 NOTE — ED Notes (Signed)
CRITICAL VALUE ALERT  Critical value received:  Potassium 2.9  Date of notification:  01/07/14  Time of notification:  4847  Critical value read back:Yes.    Nurse who received alert:  Matilde Haymaker, RN  MD notified (1st page):  Dr. Wilson Singer  Time of first page:  [phone]

## 2014-01-07 NOTE — Discharge Instructions (Signed)
Shortness of Breath Shortness of breath means you have trouble breathing. It could also mean that you have a medical problem. You should get immediate medical care for shortness of breath. CAUSES   Not enough oxygen in the air such as with high altitudes or a smoke-filled room.  Certain lung diseases, infections, or problems.  Heart disease or conditions, such as angina or heart failure.  Low red blood cells (anemia).  Poor physical fitness, which can cause shortness of breath when you exercise.  Chest or back injuries or stiffness.  Being overweight.  Smoking.  Anxiety, which can make you feel like you are not getting enough air. DIAGNOSIS  Serious medical problems can often be found during your physical exam. Tests may also be done to determine why you are having shortness of breath. Tests may include:  Chest X-rays.  Lung function tests.  Blood tests.  An electrocardiogram (ECG).  An ambulatory electrocardiogram. An ambulatory ECG records your heartbeat patterns over a 24-hour period.  Exercise testing.  A transthoracic echocardiogram (TTE). During echocardiography, sound waves are used to evaluate how blood flows through your heart.  A transesophageal echocardiogram (TEE).  Imaging scans. Your health care provider may not be able to find a cause for your shortness of breath after your exam. In this case, it is important to have a follow-up exam with your health care provider as directed.  TREATMENT  Treatment for shortness of breath depends on the cause of your symptoms and can vary greatly. HOME CARE INSTRUCTIONS   Do not smoke. Smoking is a common cause of shortness of breath. If you smoke, ask for help to quit.  Avoid being around chemicals or things that may bother your breathing, such as paint fumes and dust.  Rest as needed. Slowly resume your usual activities.  If medicines were prescribed, take them as directed for the full length of time directed. This  includes oxygen and any inhaled medicines.  Keep all follow-up appointments as directed by your health care provider. SEEK MEDICAL CARE IF:   Your condition does not improve in the time expected.  You have a hard time doing your normal activities even with rest.  You have any new symptoms. SEEK IMMEDIATE MEDICAL CARE IF:   Your shortness of breath gets worse.  You feel light-headed, faint, or develop a cough not controlled with medicines.  You start coughing up blood.  You have pain with breathing.  You have chest pain or pain in your arms, shoulders, or abdomen.  You have a fever.  You are unable to walk up stairs or exercise the way you normally do. MAKE SURE YOU:  Understand these instructions.  Will watch your condition.  Will get help right away if you are not doing well or get worse. Document Released: 10/02/2000 Document Revised: 01/12/2013 Document Reviewed: 03/25/2011 [facility] Patient Information 2015 [place], [place]. This information is not intended to replace advice given to you by your health care provider. Make sure you discuss any questions you have with your health care provider.  Chronic Obstructive Pulmonary Disease Exacerbation Chronic obstructive pulmonary disease (COPD) is a common lung condition in which airflow from the lungs is limited. COPD is a general term that can be used to describe many different lung problems that limit airflow, including chronic bronchitis and emphysema. COPD exacerbations are episodes when breathing symptoms become much worse and require extra treatment. Without treatment, COPD exacerbations can be life threatening, and frequent COPD exacerbations can cause further damage to  your lungs. CAUSES   Respiratory infections.   Exposure to smoke.   Exposure to air pollution, chemical fumes, or dust. Sometimes there is no apparent cause or trigger. RISK FACTORS  Smoking cigarettes.  Older age.  Frequent prior COPD  exacerbations. SIGNS AND SYMPTOMS   Increased coughing.   Increased thick spit (sputum) production.   Increased wheezing.   Increased shortness of breath.   Rapid breathing.   Chest tightness. DIAGNOSIS  Your medical history, a physical exam, and tests will help your health care provider make a diagnosis. Tests may include:  A chest X-ray.  Basic lab tests.  Sputum testing.  An arterial blood gas test. TREATMENT  Depending on the severity of your COPD exacerbation, you may need to be admitted to a hospital for treatment. Some of the treatments commonly used to treat COPD exacerbations are:   Antibiotic medicines.   Bronchodilators. These are drugs that expand the air passages. They may be given with an inhaler or nebulizer. Spacer devices may be needed to help improve drug delivery.  Corticosteroid medicines.  Supplemental oxygen therapy.  HOME CARE INSTRUCTIONS   Do not smoke. Quitting smoking is very important to prevent COPD from getting worse and exacerbations from happening as often.  Avoid exposure to all substances that irritate the airway, especially to tobacco smoke.   If you were prescribed an antibiotic medicine, finish it all even if you start to feel better.  Take all medicines as directed by your health care provider.It is important to use correct technique with inhaled medicines.  Drink enough fluids to keep your urine clear or pale yellow (unless you have a medical condition that requires fluid restriction).  Use a cool mist vaporizer. This makes it easier to clear your chest when you cough.   If you have a home nebulizer and oxygen, continue to use them as directed.   Maintain all necessary vaccinations to prevent infections.   Exercise regularly.   Eat a healthy diet.   Keep all follow-up appointments as directed by your health care provider. SEEK IMMEDIATE MEDICAL CARE IF:  You have worsening shortness of breath.   You  have trouble talking.   You have severe chest pain.  You have blood in your sputum.  You have a fever.  You have weakness, vomit repeatedly, or faint.   You feel confused.   You continue to get worse. MAKE SURE YOU:   Understand these instructions.  Will watch your condition.  Will get help right away if you are not doing well or get worse. Document Released: 11/04/2006 Document Revised: 05/24/2013 Document Reviewed: 09/11/2012 [facility] Patient Information 2015 [place], [place]. This information is not intended to replace advice given to you by your health care provider. Make sure you discuss any questions you have with your health care provider.

## 2014-01-07 NOTE — ED Notes (Signed)
Will discharge when potassium infusion completed.

## 2014-01-07 NOTE — ED Notes (Signed)
Patient ambulated without oxygen. Oxygen saturation ranged from 91-95% on room air. Patient still noticeably short of breath. Patient reports that he feels better and is ready to go home.

## 2014-01-07 NOTE — ED Notes (Signed)
Patient requesting inhaler for home use. MD aware and order obtained. Instructions given on use and demonstrated by patient. Patient with no complaints at this time. Respirations even and unlabored. Skin warm/dry. Discharge instructions reviewed with patient at this time. Patient given opportunity to voice concerns/ask questions. IV removed per policy and band-aid applied to site. Patient discharged at this time and left Emergency Department with steady gait.

## 2014-01-20 NOTE — ED Provider Notes (Signed)
CSN: [id number]     Arrival date & time 01/07/14  0340 History   First MD Initiated Contact with Patient 01/07/14 0404     Chief Complaint  Patient presents with  . Shortness of Breath     (Consider location/radiation/quality/duration/timing/severity/associated sxs/prior Treatment) HPI   63 year old male with dyspnea. Onset about a day ago but worsening round every morning. Reports history of COPD. This is a change in his baseline though. Increased cough but it is nonproductive. No fevers or chills. Denies any pain. No nausea or vomiting. No unusual leg pain or swelling.  Past Medical History  Diagnosis Date  . Cardiac tamponade   . Aortic dissection     TYPE 3  . HTN (hypertension)   . CAD (coronary artery disease)   . Hypercholesterolemia   . Chronic back pain   . Hiatal hernia   . Schatzki's ring   . Chronic fatigue   . Chronic pain   . Peptic ulcer disease 08/2010    EGD   . COPD (chronic obstructive pulmonary disease)   . Chronic bronchitis    Past Surgical History  Procedure Laterality Date  . Sternal wire removeal  12/31/2007    [facility]  . Cabg x 6  07/31/2007    [facility]DRICKSON  . Cardiac catheterization    . Knee surgery Right     acl  . Shoulder surgery Left   . Orif scapular fracture Right   . Back surgery    . Olecranon bursectomy Left 08/27/2012    Procedure: EXCISION LEFT OLECRANON BURSA;  Surgeon: Sanjuana Kava, MD;  Location: [facility];  Service: Orthopedics;  Laterality: Left;  . Olecranon bursectomy Left 05/25/2013    Procedure:  Excision sinus tract and tissue left elbow ;  Surgeon: Sanjuana Kava, MD;  Location: [facility];  Service: Orthopedics;  Laterality: Left;   Family History  Problem Relation Age of Onset  . Heart disease Mother   . Heart disease Father    History  Substance Use Topics  . Smoking status: Current Some Day Smoker -- 0.50 packs/day for 50 years    Types: Cigarettes  . Smokeless tobacco: Never Used  . Alcohol Use: No    Review  of Systems  All systems reviewed and negative, other than as noted in HPI.   Allergies  Review of patient's allergies indicates no known allergies.  Home Medications   Prior to Admission medications   Medication Sig Start Date End Date Taking? Authorizing Provider  ezetimibe (ZETIA) 10 MG tablet Take 10 mg by mouth daily.    Historical Provider, MD  fentaNYL (DURAGESIC - DOSED MCG/HR) 50 MCG/HR Place 1 patch onto the skin every 3 (three) days.  09/04/10   Historical Provider, MD  furosemide (LASIX) 20 MG tablet Take 1 tablet (20 mg total) by mouth daily. 01/07/14   Virgel Manifold, MD  isosorbide mononitrate (IMDUR) 60 MG 24 hr tablet Take 1 tablet (60 mg total) by mouth 2 (two) times daily. 12/28/13   Candee Furbish, MD  levofloxacin (LEVAQUIN) 500 MG tablet Take 500 mg by mouth daily. 06/04/13   Historical Provider, MD  metoprolol tartrate (LOPRESSOR) 25 MG tablet TAKE ONE TABLET BY MOUTH TWICE DAILY 01/03/14   Candee Furbish, MD  nitroGLYCERIN (NITROSTAT) 0.4 MG SL tablet Place 0.4 mg under the tongue every 5 (five) minutes as needed. Chest pain    Historical Provider, MD  Omega-3 Fatty Acids (FISH OIL) 1000 MG CAPS Take 1 capsule by mouth daily.  Historical Provider, MD  pantoprazole (PROTONIX) 40 MG tablet Take 40 mg by mouth 2 (two) times daily.  08/08/10   Historical Provider, MD  predniSONE (DELTASONE) 50 MG tablet 1 tablet daily for 4 days, one half tablet daily for 4 days.  Dispense 6 tablets 01/07/14   Virgel Manifold, MD  rosuvastatin (CRESTOR) 20 MG tablet Take 20 mg by mouth daily.    Historical Provider, MD  sertraline (ZOLOFT) 50 MG tablet Take 50 mg by mouth 2 (two) times daily.    Historical Provider, MD   BP 158/95 mmHg  Pulse 85  Temp(Src) 98.4 F (36.9 C) (Oral)  Resp 16  Ht 5\' 10"  (1.778 m)  Wt 147 lb (66.679 kg)  BMI 21.09 kg/m2  SpO2 96% Physical Exam  Constitutional: He appears well-developed and well-nourished. No distress.  HENT:  Head: Normocephalic and  atraumatic.  Eyes: Conjunctivae are normal. Right eye exhibits no discharge. Left eye exhibits no discharge.  Neck: Neck supple.  Cardiovascular: Normal rate, regular rhythm and normal heart sounds.  Exam reveals no gallop and no friction rub.   No murmur heard. Pulmonary/Chest: Effort normal. No respiratory distress. He has wheezes.  Coarse breath sounds and wheezing bilaterally. Prolonged expiratory phase  Abdominal: Soft. He exhibits no distension. There is no tenderness.  Musculoskeletal: He exhibits edema. He exhibits no tenderness.  Mild, symmetric pitting lower exam edema. No calf tenderness. Negative Homans.  Neurological: He is alert.  Skin: Skin is warm and dry.  Psychiatric: He has a normal mood and affect. His behavior is normal. Thought content normal.  Nursing note and vitals reviewed.   ED Course  Procedures (including critical care time) Labs Review Labs Reviewed  CBC WITH DIFFERENTIAL - Abnormal; Notable for the following:    RBC 3.11 (*)    Hemoglobin 10.2 (*)    HCT 30.5 (*)    Neutrophils Relative % 86 (*)    Lymphocytes Relative 11 (*)    Monocytes Relative 2 (*)    All other components within normal limits  BASIC METABOLIC PANEL - Abnormal; Notable for the following:    Potassium 2.9 (*)    Glucose, Bld 111 (*)    All other components within normal limits  PRO B NATRIURETIC PEPTIDE - Abnormal; Notable for the following:    Pro B Natriuretic peptide (BNP) 5182.0 (*)    All other components within normal limits  TROPONIN I    Imaging Review No results found.   EKG Interpretation   Date/Time:  Friday January 07 2014 03:50:26 EST Ventricular Rate:  82 PR Interval:  176 QRS Duration: 94 QT Interval:  398 QTC Calculation: 465 R Axis:   63 Text Interpretation:  Sinus rhythm RSR' in V1 or V2, probably normal  variant Borderline T abnormalities, inferior leads ED PHYSICIAN  INTERPRETATION AVAILABLE IN [facility] Confirmed by TEST, Record   ([id number]) on 01/09/2014 7:29:57 AM      MDM   Final diagnoses:  Shortness of breath  COPD exacerbation     63 year old male with what is likely COPD exacerbation. Treated M.D. with some improvement of his symptoms. I feel is appropriate for further outpatient treatment at this time. May be some component of volume overload. Few days of Lasix in addition to steroids. As needed albuterol. Incidentally noted hypokalemia. Some of this may be secondary to concurrent albuterol treatment around the time labs are drawn. Supplementation in the emergency room.Lasix ordered only for additional 2 days. I don't feel strongly that further  supplementation as needed but discussed consumption of potassium issues with the next few days.    Virgel Manifold, MD 01/20/14 [id number]

## 2014-01-24 ENCOUNTER — Other Ambulatory Visit: Payer: Self-pay | Admitting: Cardiology

## 2014-02-01 ENCOUNTER — Ambulatory Visit: Payer: Medicare PPO | Admitting: Cardiology

## 2014-02-04 ENCOUNTER — Ambulatory Visit (HOSPITAL_COMMUNITY)
Admission: RE | Admit: 2014-02-04 | Discharge: 2014-02-04 | Disposition: A | Discharge status: Home / Self Care | Payer: Medicare PPO | Source: Ambulatory Visit | Attending: Orthopaedic Surgery | Admitting: Orthopaedic Surgery

## 2014-02-04 ENCOUNTER — Encounter (HOSPITAL_COMMUNITY)
Admission: RE | Admit: 2014-02-04 | Discharge: 2014-02-04 | Disposition: A | Discharge status: Home / Self Care | Payer: Medicare PPO | Source: Ambulatory Visit | Attending: Orthopaedic Surgery | Admitting: Orthopaedic Surgery

## 2014-02-04 DIAGNOSIS — L03114 Cellulitis of left upper limb: Secondary | ICD-10-CM | POA: Insufficient documentation

## 2014-02-04 DIAGNOSIS — Z452 Encounter for adjustment and management of vascular access device: Secondary | ICD-10-CM | POA: Insufficient documentation

## 2014-02-04 LAB — DIFFERENTIAL
BASOS PCT: 0 % (ref 0–1)
Basophils Absolute: 0 10*3/uL (ref 0.0–0.1)
Eosinophils Absolute: 0.2 10*3/uL (ref 0.0–0.7)
Eosinophils Relative: 3 % (ref 0–5)
LYMPHS ABS: 1.3 10*3/uL (ref 0.7–4.0)
Lymphocytes Relative: 15 % (ref 12–46)
MONO ABS: 0.6 10*3/uL (ref 0.1–1.0)
Monocytes Relative: 7 % (ref 3–12)
NEUTROS ABS: 6.4 10*3/uL (ref 1.7–7.7)
NEUTROS PCT: 75 % (ref 43–77)

## 2014-02-04 LAB — CBC
HEMATOCRIT: 29.2 % — AB (ref 39.0–52.0)
Hemoglobin: 9.8 g/dL — ABNORMAL LOW (ref 13.0–17.0)
MCH: 33.7 pg (ref 26.0–34.0)
MCHC: 33.6 g/dL (ref 30.0–36.0)
MCV: 100.3 fL — AB (ref 78.0–100.0)
PLATELETS: 163 10*3/uL (ref 150–400)
RBC: 2.91 MIL/uL — AB (ref 4.22–5.81)
RDW: 14.8 % (ref 11.5–15.5)
WBC: 8.2 10*3/uL (ref 4.0–10.5)

## 2014-02-04 LAB — COMPREHENSIVE METABOLIC PANEL
ALBUMIN: 3.3 g/dL — AB (ref 3.5–5.2)
ALK PHOS: 57 U/L (ref 39–117)
ALT: 7 U/L (ref 0–53)
AST: 14 U/L (ref 0–37)
Anion gap: 7 (ref 5–15)
BUN: 11 mg/dL (ref 6–23)
CO2: 26 mmol/L (ref 19–32)
Calcium: 8.3 mg/dL — ABNORMAL LOW (ref 8.4–10.5)
Chloride: 107 mEq/L (ref 96–112)
Creatinine, Ser: 0.7 mg/dL (ref 0.50–1.35)
GFR calc Af Amer: >90 mL/min (ref >90)
Glucose, Bld: 103 mg/dL — ABNORMAL HIGH (ref 70–99)
Potassium: 3.1 mmol/L — ABNORMAL LOW (ref 3.5–5.1)
SODIUM: 140 mmol/L (ref 135–145)
Total Bilirubin: 0.2 mg/dL — ABNORMAL LOW (ref 0.3–1.2)
Total Protein: 6.1 g/dL (ref 6.0–8.3)

## 2014-02-04 MED ORDER — SODIUM CHLORIDE 0.9 % IV SOLN
INTRAVENOUS | Status: DC
Start: 2014-02-04 — End: 2014-02-05

## 2014-02-04 MED ORDER — VANCOMYCIN HCL IN DEXTROSE 1-5 GM/200ML-% IV SOLN
1000.0000 mg | INTRAVENOUS | Status: DC
  Administered 2014-02-04: 1000 mg via INTRAVENOUS

## 2014-02-04 MED ORDER — SODIUM CHLORIDE 0.9 % IJ SOLN: 10.0000 mL | INTRAMUSCULAR | Status: DC | PRN

## 2014-02-04 MED ORDER — VANCOMYCIN HCL 500 MG IV SOLR
500.0000 mg | Freq: Once | INTRAVENOUS | Status: DC
  Filled 2014-02-04: qty 500

## 2014-02-04 MED ORDER — VANCOMYCIN HCL IN DEXTROSE 1-5 GM/200ML-% IV SOLN: 1000.0000 mg | Freq: Once | INTRAVENOUS | Status: DC

## 2014-02-04 MED ORDER — SODIUM CHLORIDE 0.9 % IV SOLN
INTRAVENOUS | Status: DC
  Administered 2014-02-04: 200 mL via INTRAVENOUS

## 2014-02-04 MED ORDER — SODIUM CHLORIDE 0.9 % IJ SOLN: 10.0000 mL | Freq: Two times a day (BID) | INTRAMUSCULAR | Status: DC

## 2014-02-04 MED ORDER — VANCOMYCIN HCL 500 MG IV SOLR
500.0000 mg | Freq: Once | INTRAVENOUS | Status: AC
  Administered 2014-02-04: 500 mg via INTRAVENOUS
  Filled 2014-02-04: qty 500

## 2014-02-04 MED ORDER — VANCOMYCIN HCL IN DEXTROSE 1-5 GM/200ML-% IV SOLN
1000.0000 mg | Freq: Once | INTRAVENOUS | Status: DC
  Filled 2014-02-04: qty 200

## 2014-02-04 NOTE — Progress Notes (Signed)
Tolerated vancomycin well. To return to ED tomorrow and Sunday at 1100 for next infusions to be done in Short Stay.

## 2014-02-04 NOTE — Discharge Instructions (Signed)
PICC Insertion, Care After Refer to this sheet in the next few weeks. These instructions provide you with information on caring for yourself after your procedure. Your health care provider may also give you more specific instructions. Your treatment has been planned according to current medical practices, but problems sometimes occur. Call your health care provider if you have any problems or questions after your procedure. WHAT TO EXPECT AFTER THE PROCEDURE After your procedure, it is typical to have the following:  Mild discomfort at the insertion site. This should not last more than a day. HOME CARE INSTRUCTIONS  Rest at home for the remainder of the day after the procedure.  You may bend your arm and move it freely. If your PICC is near or at the bend of your elbow, avoid activity with repeated motion at the elbow.  Avoid lifting heavy objects as instructed by your health care provider.  Avoid using a crutch with the arm on the same side as your PICC. You may need to use a walker. Bandage Care  Keep your PICC bandage (dressing) clean and dry to prevent infection.  Ask your health care provider when you may shower. To keep the dressing dry, cover the PICC with plastic wrap and tape before showering. If the dressing does become wet, replace it right after the shower.  Do not soak in the bath, swim, or use hot tubs when you have a PICC.  Change the PICC dressing as instructed by your health care provider.  Change your PICC dressing if it becomes loose or wet. General PICC Care  Check the PICC insertion site daily for leakage, redness, swelling, or pain.  Flush the PICC as directed by your health care provider. Let your health care provider know right away if the PICC is difficult to flush or does not flush. Do not use force to flush the PICC.  Do not use a syringe that is less than 10 mL to flush the PICC.  Never pull or tug on the PICC.  Avoid blood pressure checks on the arm  with the PICC.  Keep your PICC identification card with you at all times.  Do not take the PICC out yourself. Only a trained health care professional should remove the PICC. SEEK MEDICAL CARE IF:  You have pain in your arm, ear, face, or teeth.  You have fever or chills.  You have drainage from the PICC insertion site.  You have redness or palpate a "cord" around the PICC insertion site.  You cannot flush the catheter. SEEK IMMEDIATE MEDICAL CARE IF:  You have swelling in the arm in which the PICC is inserted. Document Released: 10/28/2012 Document Revised: 01/12/2013 Document Reviewed: 10/28/2012 [facility] Patient Information 2015 [place], [place]. This information is not intended to replace advice given to you by your health care provider. Make sure you discuss any questions you have with your health care provider. Peripherally Inserted Central Catheter/Midline Placement  The IV Nurse has discussed with the patient and/or persons authorized to consent for the patient, the purpose of this procedure and the potential benefits and risks involved with this procedure.  The benefits include less needle sticks, lab draws from the catheter and patient may be discharged home with the catheter.  Risks include, but not limited to, infection, bleeding, blood clot (thrombus formation), and puncture of an artery; nerve damage and irregular heat beat.  Alternatives to this procedure were also discussed.  PICC/Midline Placement Documentation  PICC / Midline Single Lumen 02/04/14 PICC  Right Basilic 41 cm 0 cm (Active)  Indication for Insertion or Continuance of Line Vasoactive infusions;Home intravenous therapies (PICC only) 02/04/2014 11:00 AM  Exposed Catheter (cm) 0 cm 02/04/2014 11:00 AM  Site Assessment Clean;Dry;Intact 02/04/2014 11:00 AM  Line Status Infusing 02/04/2014 11:00 AM  Dressing Type Transparent 02/04/2014 11:00 AM  Dressing Status Clean;Dry;Intact 02/04/2014 11:00 AM  Line Care  Connections checked and tightened 02/04/2014 11:00 AM  Dressing Intervention New dressing 02/04/2014 11:00 AM  Dressing Change Due 02/11/14 02/04/2014 11:00 AM       Hillery Jacks 02/04/2014, 11:11 AM

## 2014-02-04 NOTE — Progress Notes (Signed)
Peripherally Inserted Central Catheter/Midline Placement  The IV Nurse has discussed with the patient and/or persons authorized to consent for the patient, the purpose of this procedure and the potential benefits and risks involved with this procedure.  The benefits include less needle sticks, lab draws from the catheter and patient may be discharged home with the catheter.  Risks include, but not limited to, infection, bleeding, blood clot (thrombus formation), and puncture of an artery; nerve damage and irregular heat beat.  Alternatives to this procedure were also discussed.  PICC/Midline Placement Documentation  PICC / Midline Single Lumen 15/94/58 PICC Right Basilic 41 cm 0 cm (Active)  Indication for Insertion or Continuance of Line Vasoactive infusions;Home intravenous therapies (PICC only) 02/04/2014 11:00 AM  Exposed Catheter (cm) 0 cm 02/04/2014 11:00 AM  Site Assessment Clean;Dry;Intact 02/04/2014 11:00 AM  Line Status Infusing 02/04/2014 11:00 AM  Dressing Type Transparent 02/04/2014 11:00 AM  Dressing Status Clean;Dry;Intact 02/04/2014 11:00 AM  Line Care Connections checked and tightened 02/04/2014 11:00 AM  Dressing Intervention New dressing 02/04/2014 11:00 AM  Dressing Change Due 02/11/14 02/04/2014 11:00 AM       Hillery Jacks 02/04/2014, 11:09 AM

## 2014-02-04 NOTE — Progress Notes (Signed)
Pharmacy Note:  Initial antibiotics for Vancomycin ordered by MD for cellulitis .  CrCl cannot be calculated (Unknown ideal weight.).   Weight = 150lbs per RN report  No Known Allergies  There were no vitals filed for this visit.  Anti-infectives    Start     Dose/Rate Route Frequency Ordered Stop   02/04/14 1115  vancomycin (VANCOCIN) IVPB 1000 mg/200 mL premix     1,000 mg200 mL/hr over 60 Minutes Intravenous  Once 02/04/14 1101       Labs and plan pending.  Plan: Initial dose of Vancomycin 1000mg  IV X 1 ordered. F/U additional plans for subsequent dosing if to be compelted at [facility] vs via home health.   Pricilla Larsson, [facility] 02/04/2014 11:02 AM

## 2014-02-04 NOTE — Progress Notes (Signed)
Pharmacy Note:  Initial antibiotics for Vancomycin ordered by Dr Luna Glasgow  for cellulitis for 10 days  Estimated Creatinine Clearance: 86 mL/min (by C-G formula based on Cr of 0.7).   Weight = 63.5 kg  No Known Allergies  Filed Vitals:   02/04/14 1127  BP: 149/83  Pulse: 68  Temp: 97.6 F (36.4 C)  Resp: 18    Anti-infectives    Start     Dose/Rate Route Frequency Ordered Stop   02/04/14 1300  vancomycin (VANCOCIN) 500 mg in sodium chloride 0.9 % 100 mL IVPB     500 mg100 mL/hr over 60 Minutes Intravenous  Once 02/04/14 1257     02/04/14 1215  vancomycin (VANCOCIN) IVPB 1000 mg/200 mL premix     1,000 mg200 mL/hr over 60 Minutes Intravenous Every 24 hours 02/04/14 1209        Plan: Vancomycin 1500 mg IV every 24 hours for 10 days. Vancomycin trough and BMET Monday 02/07/2014. Vancomycin once daily dose if possible due to transportation to hospital for dose Vancomycin trough goal 5-10 mcg/ml  [facility]  02/04/2014  120 PM  .   Gildardo Griffes [place], [facility] 02/04/2014 1:12 PM

## 2014-02-05 ENCOUNTER — Encounter (HOSPITAL_COMMUNITY)
Admission: RE | Admit: 2014-02-05 | Discharge: 2014-02-05 | Disposition: A | Discharge status: Home / Self Care | Payer: Medicare PPO | Source: Ambulatory Visit | Attending: Orthopaedic Surgery | Admitting: Orthopaedic Surgery

## 2014-02-05 ENCOUNTER — Encounter (HOSPITAL_COMMUNITY): Payer: Self-pay

## 2014-02-05 DIAGNOSIS — L03114 Cellulitis of left upper limb: Secondary | ICD-10-CM | POA: Diagnosis not present

## 2014-02-05 MED ORDER — VANCOMYCIN HCL 10 G IV SOLR
1500.0000 mg | Freq: Once | INTRAVENOUS | Status: AC
  Administered 2014-02-06: 1500 mg via INTRAVENOUS
  Filled 2014-02-05: qty 1500

## 2014-02-05 MED ORDER — SODIUM CHLORIDE 0.9 % IV SOLN
INTRAVENOUS | Status: DC
Start: 2014-02-05 — End: 2014-02-06
  Administered 2014-02-05: 11:00:00 via INTRAVENOUS

## 2014-02-05 MED ORDER — SODIUM CHLORIDE 0.9 % IV SOLN
1500.0000 mg | Freq: Once | INTRAVENOUS | Status: AC
  Administered 2014-02-05: 1500 mg via INTRAVENOUS
  Filled 2014-02-05: qty 1500

## 2014-02-06 ENCOUNTER — Encounter (HOSPITAL_COMMUNITY)
Admission: RE | Admit: 2014-02-06 | Discharge: 2014-02-06 | Disposition: A | Discharge status: Home / Self Care | Payer: Medicare PPO | Source: Ambulatory Visit | Attending: Orthopaedic Surgery | Admitting: Orthopaedic Surgery

## 2014-02-06 ENCOUNTER — Encounter (HOSPITAL_COMMUNITY): Payer: Self-pay

## 2014-02-06 DIAGNOSIS — L03114 Cellulitis of left upper limb: Secondary | ICD-10-CM | POA: Diagnosis not present

## 2014-02-06 MED ORDER — SODIUM CHLORIDE 0.9 % IV SOLN
INTRAVENOUS | Status: DC
Start: 2014-02-06 — End: 2014-02-07
  Administered 2014-02-06: 11:00:00 via INTRAVENOUS

## 2014-02-07 ENCOUNTER — Encounter (HOSPITAL_COMMUNITY)
Admission: RE | Admit: 2014-02-07 | Discharge: 2014-02-07 | Disposition: A | Discharge status: Home / Self Care | Payer: Medicare PPO | Source: Ambulatory Visit | Attending: Orthopaedic Surgery | Admitting: Orthopaedic Surgery

## 2014-02-07 ENCOUNTER — Encounter (HOSPITAL_COMMUNITY): Payer: Medicare PPO

## 2014-02-07 DIAGNOSIS — L03114 Cellulitis of left upper limb: Secondary | ICD-10-CM | POA: Diagnosis not present

## 2014-02-07 LAB — BASIC METABOLIC PANEL
ANION GAP: 7 (ref 5–15)
BUN: 6 mg/dL (ref 6–23)
CALCIUM: 8.1 mg/dL — AB (ref 8.4–10.5)
CO2: 31 mmol/L (ref 19–32)
Chloride: 99 mEq/L (ref 96–112)
Creatinine, Ser: 0.65 mg/dL (ref 0.50–1.35)
GFR calc Af Amer: >90 mL/min (ref >90)
GFR calc non Af Amer: >90 mL/min (ref >90)
Glucose, Bld: 95 mg/dL (ref 70–99)
POTASSIUM: 2.8 mmol/L — AB (ref 3.5–5.1)
Sodium: 137 mmol/L (ref 135–145)

## 2014-02-07 LAB — VANCOMYCIN, TROUGH: VANCOMYCIN TR: 6.2 ug/mL — AB (ref 10.0–20.0)

## 2014-02-07 MED ORDER — HEPARIN SOD (PORK) LOCK FLUSH 100 UNIT/ML IV SOLN
250.0000 [IU] | INTRAVENOUS | Status: AC | PRN
  Administered 2014-02-07: 250 [IU]

## 2014-02-07 MED ORDER — HEPARIN SOD (PORK) LOCK FLUSH 100 UNIT/ML IV SOLN
INTRAVENOUS | Status: AC
  Filled 2014-02-07: qty 5

## 2014-02-07 MED ORDER — SODIUM CHLORIDE 0.9 % IV SOLN
1500.0000 mg | INTRAVENOUS | Status: DC
  Administered 2014-02-07: 1500 mg via INTRAVENOUS
  Filled 2014-02-07: qty 1500

## 2014-02-07 MED ORDER — SODIUM CHLORIDE 0.9 % IJ SOLN
10.0000 mL | Freq: Once | INTRAMUSCULAR | Status: AC
  Administered 2014-02-07: 10 mL via INTRAVENOUS
  Filled 2014-02-07: qty 3

## 2014-02-07 MED ORDER — SODIUM CHLORIDE 0.9 % IV SOLN
INTRAVENOUS | Status: DC
  Administered 2014-02-07: 11:00:00 via INTRAVENOUS

## 2014-02-07 NOTE — Progress Notes (Signed)
Results for RAMI, BUDHU (MRN [id number]) as of 02/07/2014 14:25  Ref. Range 02/07/2014 11:00  Sodium Latest Range: 135-145 mmol/L 137  Potassium Latest Range: 3.5-5.1 mmol/L 2.8 (L)  Chloride Latest Range: 96-112 mEq/L 99  CO2 Latest Range: 19-32 mmol/L 31  BUN Latest Range: 6-23 mg/dL 6  Creatinine Latest Range: 0.50-1.35 mg/dL 0.65  Calcium Latest Range: 8.4-10.5 mg/dL 8.1 (L)  GFR calc non Af Amer Latest Range: >90 mL/min >90  GFR calc Af Amer Latest Range: >90 mL/min >90  Glucose Latest Range: 70-99 mg/dL 95  Anion gap Latest Range: 5-15  7

## 2014-02-07 NOTE — Progress Notes (Signed)
Initial antibiotics for Vancomycin ordered by Dr Luna Glasgow for cellulitis for 10 days  Estimated Creatinine Clearance: 86 mL/min (by C-G formula based on Cr of 0.7).   Weight = 63.5 kg  No Known Allergies  Patient has received Vancomycin 1500 mg IV daily for 3 doses Vancomycin goal 5-15 ug/ml for cellulitis Vancomycin level 6.2 ug/ml 01/21/2014  11 AM   Plan: Continue Vancomycin 1500 mg IV every 24 hours for a total of 10 days Vancomycin trough if therapy continues past 10 days  BPittmanRPH 02/07/2014 230 PM

## 2014-02-08 ENCOUNTER — Encounter (HOSPITAL_COMMUNITY)
Admission: RE | Admit: 2014-02-08 | Discharge: 2014-02-08 | Disposition: A | Discharge status: Home / Self Care | Payer: Medicare PPO | Source: Ambulatory Visit | Attending: Orthopaedic Surgery | Admitting: Orthopaedic Surgery

## 2014-02-08 DIAGNOSIS — L03114 Cellulitis of left upper limb: Secondary | ICD-10-CM | POA: Diagnosis not present

## 2014-02-08 MED ORDER — HEPARIN SOD (PORK) LOCK FLUSH 100 UNIT/ML IV SOLN: 250.0000 [IU] | INTRAVENOUS | Status: DC | PRN

## 2014-02-08 MED ORDER — SODIUM CHLORIDE 0.9 % IJ SOLN
10.0000 mL | INTRAMUSCULAR | Status: AC | PRN
  Administered 2014-02-08: 10 mL

## 2014-02-08 MED ORDER — VANCOMYCIN HCL 10 G IV SOLR
1500.0000 mg | INTRAVENOUS | Status: DC
  Administered 2014-02-08: 1500 mg via INTRAVENOUS
  Filled 2014-02-08: qty 1500

## 2014-02-08 MED ORDER — SODIUM CHLORIDE 0.9 % IV SOLN
INTRAVENOUS | Status: DC
  Administered 2014-02-08: 10:00:00 via INTRAVENOUS

## 2014-02-09 ENCOUNTER — Encounter (HOSPITAL_COMMUNITY)
Admission: RE | Admit: 2014-02-09 | Discharge: 2014-02-09 | Disposition: A | Discharge status: Home / Self Care | Payer: Medicare PPO | Source: Ambulatory Visit | Attending: Orthopaedic Surgery | Admitting: Orthopaedic Surgery

## 2014-02-09 DIAGNOSIS — L03114 Cellulitis of left upper limb: Secondary | ICD-10-CM | POA: Diagnosis not present

## 2014-02-09 MED ORDER — VANCOMYCIN HCL 10 G IV SOLR
1500.0000 mg | INTRAVENOUS | Status: DC
  Administered 2014-02-09: 1500 mg via INTRAVENOUS
  Filled 2014-02-09: qty 1500

## 2014-02-09 MED ORDER — HEPARIN SOD (PORK) LOCK FLUSH 100 UNIT/ML IV SOLN
250.0000 [IU] | INTRAVENOUS | Status: AC | PRN
  Administered 2014-02-09: 250 [IU]
  Filled 2014-02-09: qty 5

## 2014-02-09 MED ORDER — SODIUM CHLORIDE 0.9 % IV SOLN
INTRAVENOUS | Status: DC
  Administered 2014-02-09: 200 mL via INTRAVENOUS

## 2014-02-09 MED ORDER — SODIUM CHLORIDE 0.9 % IJ SOLN
10.0000 mL | INTRAMUSCULAR | Status: AC | PRN
  Administered 2014-02-09: 10 mL
  Filled 2014-02-09: qty 3

## 2014-02-09 NOTE — Progress Notes (Signed)
Pt arrived for next dose iv vancomycin. Has congested "wheezing" cough. States has had this for "a long time". Normal resp. Refuses to see MD regarding this.

## 2014-02-10 ENCOUNTER — Encounter (HOSPITAL_COMMUNITY)
Admission: RE | Admit: 2014-02-10 | Discharge: 2014-02-10 | Disposition: A | Discharge status: Home / Self Care | Payer: Medicare PPO | Source: Ambulatory Visit | Attending: Orthopaedic Surgery | Admitting: Orthopaedic Surgery

## 2014-02-10 DIAGNOSIS — L03114 Cellulitis of left upper limb: Secondary | ICD-10-CM | POA: Diagnosis not present

## 2014-02-10 MED ORDER — SODIUM CHLORIDE 0.9 % IV SOLN
INTRAVENOUS | Status: DC
  Administered 2014-02-10: 200 mL via INTRAVENOUS

## 2014-02-10 MED ORDER — VANCOMYCIN HCL 10 G IV SOLR
1500.0000 mg | INTRAVENOUS | Status: DC
  Administered 2014-02-10: 1500 mg via INTRAVENOUS
  Filled 2014-02-10: qty 1500

## 2014-02-11 ENCOUNTER — Encounter (HOSPITAL_COMMUNITY): Admission: RE | Admit: 2014-02-11 | Payer: Medicare PPO | Source: Ambulatory Visit

## 2014-02-11 ENCOUNTER — Ambulatory Visit: Payer: Medicare PPO | Admitting: Cardiology

## 2014-02-12 ENCOUNTER — Encounter (HOSPITAL_COMMUNITY): Admission: RE | Admit: 2014-02-12 | Payer: Medicare PPO | Source: Ambulatory Visit

## 2014-02-12 DIAGNOSIS — L03114 Cellulitis of left upper limb: Secondary | ICD-10-CM | POA: Diagnosis not present

## 2014-02-12 MED ORDER — VANCOMYCIN HCL 10 G IV SOLR
1500.0000 mg | Freq: Once | INTRAVENOUS | Status: DC
  Filled 2014-02-12 (×2): qty 1500

## 2014-02-13 ENCOUNTER — Encounter (HOSPITAL_COMMUNITY): Payer: Medicare PPO

## 2014-02-13 DIAGNOSIS — L03114 Cellulitis of left upper limb: Secondary | ICD-10-CM | POA: Diagnosis not present

## 2014-02-14 ENCOUNTER — Encounter (HOSPITAL_COMMUNITY)
Admission: RE | Admit: 2014-02-14 | Discharge: 2014-02-14 | Disposition: A | Discharge status: Home / Self Care | Payer: Medicare PPO | Source: Ambulatory Visit | Attending: Orthopaedic Surgery | Admitting: Orthopaedic Surgery

## 2014-02-14 DIAGNOSIS — L03114 Cellulitis of left upper limb: Secondary | ICD-10-CM | POA: Diagnosis not present

## 2014-02-14 NOTE — Discharge Instructions (Signed)
PICC Removal A peripherally inserted central catheter (PICC) is a long, thin, flexible tube that a health care provider can insert into a vein in your upper arm. It is a type of IV. Having a PICC in place gives health care providers quick access to your veins. It is a good way to distribute medicines and fluids quickly throughout your body. LET [facility] CARE PROVIDER KNOW ABOUT:  Any allergies you have.  All medicines you are taking, including vitamins, herbs, eye drops, creams, and over-the-counter medicines.  Previous problems you or members of your family have had with the use of anesthetics.  Any blood disorders you have.  Previous surgeries you have had.  Medical conditions you have. RISKS AND COMPLICATIONS Generally, this is a safe procedure. However, as with any procedure, problems can occur. Possible problems include:  Bleeding.  Infection. BEFORE THE PROCEDURE You need an order from your health care provider to have your PICC removed. Only a health care provider trained in PICC removal should take it out.You may have your PICC removed in the hospital or in an outpatient setting. PROCEDURE Having a PICC removed is usually painless. Removal of the tape that holds the PICC in place may be the most uncomfortable part. Do not take out the PICC yourself. Only a trained clinical professional, such as a PICC nurse, should remove it. If your health care provider thinks your PICC is infected, the tip may be sent to the lab for testing. After taking out your PICC, your health care provider may:   Hold gentle pressure on the exit site.  Apply some antibiotic ointment.  Place a small bandage over the insertion site. AFTER THE PROCEDURE You should be able to remove the bandage after 24 hours. Follow all your health care provider's instructions.   Keep the insertion site clean by washing it gently with soap and water.  Do not pick or remove a scab.  Avoid strenuous physical  activity for a day or two.  Let your health care provider know if you develop redness, soreness, bleeding, swelling, or drainage from the insertion site.  Let your health care provider know if you develop chills or fever. Document Released: 06/27/2009 Document Revised: 01/12/2013 Document Reviewed: 10/30/2012 [facility] Patient Information 2015 [place], [place]. This information is not intended to replace advice given to you by your health care provider. Make sure you discuss any questions you have with your health care provider.

## 2014-02-14 NOTE — Progress Notes (Signed)
Patient here for picc line removal per drs order. Tolerated well will monitor patient for 30 mins then discharge.

## 2014-02-21 MED FILL — Vancomycin HCl For IV Soln 10 GM (Base Equivalent): INTRAVENOUS | Qty: 1500 | Status: AC

## 2014-02-28 MED FILL — Vancomycin HCl For IV Soln 10 GM (Base Equivalent): INTRAVENOUS | Qty: 1500 | Status: AC

## 2014-03-02 ENCOUNTER — Telehealth: Payer: Self-pay | Admitting: Cardiology

## 2014-03-02 NOTE — Telephone Encounter (Signed)
Left message for pt that I am in the [place] office today and requested he call me back here to discuss.

## 2014-03-02 NOTE — Telephone Encounter (Signed)
I agree with postponing appointment until infection is improved. Please notify us.

## 2014-03-02 NOTE — Telephone Encounter (Signed)
New Message  Pt stated he has had MRSA for over a year and wondered if he should come in to his 2/18 appt. He was advised not to. Pt requested to speak w/ Rn about what to do going forward. Please call back and discuss.

## 2014-03-02 NOTE — Telephone Encounter (Signed)
Spoke with pt who is asking if he should come into the office for his appt with Dr Marlou Porch next week.  He has had MRSA for over a year.  It is on his elbow about 1" X 1" "to the bone" and it is still draining.  He has been recently treated with IV antibiotics for 12 days. It is bandaged.  Aware I will ask Dr Marlou Porch and call him back.

## 2014-03-03 ENCOUNTER — Other Ambulatory Visit: Payer: Self-pay | Admitting: Cardiology

## 2014-03-10 ENCOUNTER — Ambulatory Visit: Payer: Medicare PPO | Admitting: Cardiology

## 2014-03-21 ENCOUNTER — Ambulatory Visit: Payer: Medicare PPO | Admitting: Infectious Diseases

## 2014-03-22 ENCOUNTER — Encounter: Payer: Self-pay | Admitting: Cardiology

## 2014-04-11 ENCOUNTER — Other Ambulatory Visit: Payer: Self-pay | Admitting: Cardiology

## 2014-04-29 ENCOUNTER — Ambulatory Visit (INDEPENDENT_AMBULATORY_CARE_PROVIDER_SITE_OTHER): Payer: Medicare PPO | Admitting: Cardiology

## 2014-04-29 ENCOUNTER — Encounter: Payer: Self-pay | Admitting: Cardiology

## 2014-04-29 VITALS — BP 128/84 | HR 59 | Ht 70.0 in | Wt 151.4 lb

## 2014-04-29 DIAGNOSIS — E7800 Pure hypercholesterolemia, unspecified: Secondary | ICD-10-CM

## 2014-04-29 DIAGNOSIS — I2510 Atherosclerotic heart disease of native coronary artery without angina pectoris: Secondary | ICD-10-CM | POA: Diagnosis not present

## 2014-04-29 DIAGNOSIS — I7100 Dissection of unspecified site of aorta: Secondary | ICD-10-CM | POA: Diagnosis not present

## 2014-04-29 DIAGNOSIS — I10 Essential (primary) hypertension: Secondary | ICD-10-CM | POA: Diagnosis not present

## 2014-04-29 DIAGNOSIS — I2583 Coronary atherosclerosis due to lipid rich plaque: Principal | ICD-10-CM

## 2014-04-29 NOTE — Patient Instructions (Signed)
Your physician recommends that you continue on your current medications as directed. Please refer to the Current Medication list given to you today.  Your physician wants you to follow-up in: 6 months with Dr. Skains. You will receive a reminder letter in the mail two months in advance. If you don't receive a letter, please call our office to schedule the follow-up appointment.  

## 2014-04-29 NOTE — Progress Notes (Signed)
[facility] [address], [address], [place]  [address] Phone: [phone] Fax:  [phone]  Date:  04/29/2014   ID:  [name], DOB [birth year], MRN [id number]  PCP:  [name], MD   History of Present Illness: [name] is a 65 y.o. male Has type III aortic dissection, Dr. Roxan Hockey, nonsurgical candidate. In patient's words, it is amazing that he is still here. Non-ST elevation myocardial infarction 7/09, multivessel disease.-Bypass surgery. Subsequent catheterization on 03/18/08 demonstrated 2 out of the 4 grafts occluded. Not amenable to treatment. Medical management. He also had a descending aorta, aortic dissection 9/08. COPD, bronchitis. Can't sleep. Back pain.   He currently is on isosorbide. Metoprolol. Chest "hurts 24/7" On fire to back. On a fentanyl patch. Dr. Carloyn Manner. He seems quite depressed again. His blood pressure has been elevated at home. But at times BP is quite low (? patch).  Has been to ER several times. Each time CT scan. Reassuring.   He continues to lose weight  Past 3 months wiped out. Lung dz. Cough. Depression. Wife has crack in frontal lobe. 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 Antibiotics.    Wt Readings from Last 3 Encounters:  04/29/14 151 lb 6.4 oz (68.675 kg)  02/04/14 140 lb (63.504 kg)  01/07/14 147 lb (66.679 kg)     Past Medical History  Diagnosis Date  . Cardiac tamponade   . Aortic dissection     TYPE 3  . HTN (hypertension)   . CAD (coronary artery disease)   . Hypercholesterolemia   . Chronic back pain   . Hiatal hernia   . Schatzki's ring   . Chronic fatigue   . Chronic pain   . Peptic ulcer disease 08/2010    EGD   . COPD (chronic obstructive pulmonary disease)   . Chronic bronchitis     Past Surgical History  Procedure Laterality Date  . Sternal wire removeal  12/31/2007    HENDRICKSON  . Cabg x 6  07/31/2007    HENDRICKSON  . Cardiac catheterization    . Knee  surgery Right     acl  . Shoulder surgery Left   . Orif scapular fracture Right   . Back surgery    . Olecranon bursectomy Left 08/27/2012    Procedure: EXCISION LEFT OLECRANON BURSA;  Surgeon: Sanjuana Kava, MD;  Location: [facility];  Service: Orthopedics;  Laterality: Left;  . Olecranon bursectomy Left 05/25/2013    Procedure:  Excision sinus tract and tissue left elbow ;  Surgeon: Sanjuana Kava, MD;  Location: [facility];  Service: Orthopedics;  Laterality: Left;    Current Outpatient Prescriptions  Medication Sig Dispense Refill  . ezetimibe (ZETIA) 10 MG tablet Take 10 mg by mouth daily.    . fentaNYL (DURAGESIC - DOSED MCG/HR) 50 MCG/HR Place 1 patch onto the skin every 3 (three) days.     . isosorbide mononitrate (IMDUR) 60 MG 24 hr tablet TAKE ONE TABLET BY MOUTH TWICE DAILY 60 tablet 0  . metoprolol tartrate (LOPRESSOR) 25 MG tablet TAKE ONE TABLET BY MOUTH TWICE DAILY 180 tablet 0  . metoprolol tartrate (LOPRESSOR) 25 MG tablet TAKE ONE TABLET BY MOUTH TWICE DAILY 180 tablet 0  . nitroGLYCERIN (NITROSTAT) 0.4 MG SL tablet Place 0.4 mg under the tongue every 5 (five) minutes as needed. Chest pain    . Omega-3 Fatty Acids (FISH OIL) 1000 MG CAPS  Take 1 capsule by mouth daily.    Marland Kitchen oxycodone (ROXICODONE) 30 MG immediate release tablet Take 1 tablet by mouth every 4 (four) hours as needed. pain  0  . pantoprazole (PROTONIX) 40 MG tablet Take 40 mg by mouth 2 (two) times daily.     Marland Kitchen PROAIR HFA 108 (90 BASE) MCG/ACT inhaler Take 2 puffs by mouth as needed. Lung disease    . rosuvastatin (CRESTOR) 20 MG tablet Take 20 mg by mouth daily.    . sertraline (ZOLOFT) 50 MG tablet Take 50 mg by mouth 2 (two) times daily.     No current facility-administered medications for this visit.   Facility-Administered Medications Ordered in Other Visits  Medication Dose Route Frequency Provider Last Rate Last Dose  . vancomycin (VANCOCIN) 1,500 mg in sodium chloride 0.9 % 500 mL IVPB  1,500 mg Intravenous Once  Sanjuana Kava, MD        Allergies:    Allergies  Allergen Reactions  . Codeine Other (See Comments)    Causes GI Upset    Social History:  The patient  reports that he has been smoking Cigarettes.  He has a 25 pack-year smoking history. He has never used smokeless tobacco. He reports that he does not drink alcohol or use illicit drugs.   ROS:  Please see the history of present illness.   Positive lethargy, depression. No chest pain. Positive cough  PHYSICAL EXAM: VS:  BP 128/84 mmHg  Pulse 59  Ht 5\' 10"  (1.778 m)  Wt 151 lb 6.4 oz (68.675 kg)  BMI 21.72 kg/m2 Well nourished, well developed, in no acute distress HEENT: normal Neck: no JVD Cardiac:  normal S1, S2; RRR; no murmur Lungs:  + wheezing or rales, mildly decreased breath sounds bilaterally Abd: soft, nontender, no hepatomegaly Ext: no edema Skin: warm and dry Neuro: no focal abnormalities noted  EKG:  None today Labs: Prior hypokalemia-repleted  ASSESSMENT AND PLAN:  1. Depression-asked him to discuss further with Dr. Wynetta Emery. Continue with SSRI. 2. Aortic dissection-no further discomfort. Stable. Notes reviewed. Continue with blood pressure control. 3. COPD/chronic bronchitis-continue with current care. Breathing treatments. He states that he had problems with obtaining pro-air from his insurance company. 4. Tobacco use-cessation encouraged. 5. Wound - left elbow. [place]. Abx.  6. Back pain-chronic. 7. 6 month follow up.   Signed, Candee Furbish, MD [facility]  04/29/2014 1:43 PM

## 2014-05-03 ENCOUNTER — Telehealth: Payer: Self-pay | Admitting: Cardiology

## 2014-05-03 NOTE — Telephone Encounter (Signed)
[employer] paper completed, called pt could not leave message.

## 2014-05-16 ENCOUNTER — Other Ambulatory Visit: Payer: Self-pay | Admitting: Cardiology

## 2014-07-06 ENCOUNTER — Telehealth: Payer: Self-pay | Admitting: Cardiology

## 2014-07-06 NOTE — Telephone Encounter (Signed)
Patient picked up [employer] papers.

## 2014-07-15 ENCOUNTER — Other Ambulatory Visit: Payer: Self-pay | Admitting: Cardiology

## 2014-10-19 ENCOUNTER — Other Ambulatory Visit: Payer: Self-pay | Admitting: Cardiology

## 2014-10-26 DIAGNOSIS — I25810 Atherosclerosis of coronary artery bypass graft(s) without angina pectoris: Secondary | ICD-10-CM | POA: Insufficient documentation

## 2014-10-26 DIAGNOSIS — F17210 Nicotine dependence, cigarettes, uncomplicated: Secondary | ICD-10-CM | POA: Insufficient documentation

## 2014-10-26 DIAGNOSIS — I209 Angina pectoris, unspecified: Secondary | ICD-10-CM | POA: Insufficient documentation

## 2014-10-26 DIAGNOSIS — E7800 Pure hypercholesterolemia, unspecified: Secondary | ICD-10-CM | POA: Insufficient documentation

## 2014-10-26 DIAGNOSIS — M545 Low back pain, unspecified: Secondary | ICD-10-CM | POA: Insufficient documentation

## 2014-10-26 DIAGNOSIS — J449 Chronic obstructive pulmonary disease, unspecified: Secondary | ICD-10-CM | POA: Insufficient documentation

## 2014-10-26 DIAGNOSIS — I214 Non-ST elevation (NSTEMI) myocardial infarction: Secondary | ICD-10-CM | POA: Insufficient documentation

## 2014-10-26 DIAGNOSIS — I2510 Atherosclerotic heart disease of native coronary artery without angina pectoris: Secondary | ICD-10-CM | POA: Insufficient documentation

## 2014-10-26 DIAGNOSIS — Z860101 Personal history of adenomatous and serrated colon polyps: Secondary | ICD-10-CM | POA: Insufficient documentation

## 2014-10-26 DIAGNOSIS — Z86010 Personal history of colonic polyps: Secondary | ICD-10-CM | POA: Insufficient documentation

## 2014-10-31 ENCOUNTER — Ambulatory Visit: Payer: Medicare PPO | Admitting: Cardiology

## 2014-11-02 ENCOUNTER — Encounter: Payer: Self-pay | Admitting: Cardiology

## 2014-11-14 ENCOUNTER — Ambulatory Visit (INDEPENDENT_AMBULATORY_CARE_PROVIDER_SITE_OTHER): Payer: Medicare PPO | Admitting: Internal Medicine

## 2014-11-14 ENCOUNTER — Encounter: Payer: Self-pay | Admitting: Internal Medicine

## 2014-11-14 VITALS — BP 138/88 | HR 65 | Ht 70.0 in | Wt 150.2 lb

## 2014-11-14 DIAGNOSIS — Z720 Tobacco use: Secondary | ICD-10-CM

## 2014-11-14 DIAGNOSIS — I214 Non-ST elevation (NSTEMI) myocardial infarction: Secondary | ICD-10-CM

## 2014-11-14 DIAGNOSIS — J449 Chronic obstructive pulmonary disease, unspecified: Secondary | ICD-10-CM

## 2014-11-14 DIAGNOSIS — Z23 Encounter for immunization: Secondary | ICD-10-CM | POA: Diagnosis not present

## 2014-11-14 NOTE — Progress Notes (Signed)
11/14/14-  64 yo M smoker FOLLOWS FOR: consult referred by Dr. Earle Gell for increased SOB x3 months, wheezing, tightness and violent coughing with occasional clear mucus production Known COPD with emphysema and bronchitis for years. Denies history of pneumonia. Easy dyspnea on exertion especially stairs and hills. Occasional but not routine cough. Not seasonal. Using pro-air rescue inhaler once or twice daily. Smoked for 10 or 15 years. 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 LVEF 60% 2009. CABG with severe CAD, 2/4 grafts occluded, not amenable to treatment. Shows me localized excoriated rash right paravertebral low back where he says a fentanyl patch broke his skin out. Fentanyl used for chronic back pain. Chest x-ray reviewed from January 2016 noting mild vascular congestion consistent with CHF at that time. CXR 02/04/14 FINDINGS: The cardiac silhouette, mediastinal and hilar contours are stable. Moderate tortuosity, ectasia and calcification of the thoracic aorta. Patient with known type B aortic dissection. The lungs demonstrate vascular congestion and possible mild interstitial edema. No definite pleural effusions. The right PICC line tip is in the distal SVC near the cavoatrial junction. IMPRESSION: Right PICC line tip in good position near the cavoatrial junction. Stable tortuosity, ectasia and calcification of the thoracic aorta. Moderate vascular congestion and possible mild interstitial edema. Electronically Signed  By: Kalman Jewels M.D.  On: 02/04/2014 10:57  Prior to Admission medications   Medication Sig Start Date End Date Taking? Authorizing Provider  ezetimibe (ZETIA) 10 MG tablet Take 10 mg by mouth daily.   Yes Historical Provider, MD  fentaNYL (DURAGESIC - DOSED MCG/HR) 50 MCG/HR Place 1 patch onto the skin every 3 (three) days.  09/04/10  Yes Historical Provider, MD  isosorbide  mononitrate (IMDUR) 60 MG 24 hr tablet TAKE ONE TABLET BY MOUTH TWICE DAILY 05/16/14  Yes Jerline Pain, MD  metoprolol tartrate (LOPRESSOR) 25 MG tablet TAKE ONE TABLET BY MOUTH TWICE DAILY 01/03/14  Yes Jerline Pain, MD  metoprolol tartrate (LOPRESSOR) 25 MG tablet TAKE ONE TABLET BY MOUTH TWICE DAILY 10/19/14  Yes Jerline Pain, MD  nitroGLYCERIN (NITROSTAT) 0.4 MG SL tablet Place 0.4 mg under the tongue every 5 (five) minutes as needed. Chest pain   Yes Historical Provider, MD  Omega-3 Fatty Acids (FISH OIL) 1000 MG CAPS Take 1 capsule by mouth daily.   Yes Historical Provider, MD  oxycodone (ROXICODONE) 30 MG immediate release tablet Take 1 tablet by mouth every 4 (four) hours as needed. pain 04/07/14  Yes Historical Provider, MD  pantoprazole (PROTONIX) 40 MG tablet Take 40 mg by mouth 2 (two) times daily.  08/08/10  Yes Historical Provider, MD  PROAIR HFA 108 (90 BASE) MCG/ACT inhaler Take 2 puffs by mouth as needed. Lung disease 03/10/14  Yes Historical Provider, MD  rosuvastatin (CRESTOR) 20 MG tablet Take 20 mg by mouth daily.   Yes Historical Provider, MD  sertraline (ZOLOFT) 50 MG tablet Take 50 mg by mouth 2 (two) times daily.   Yes Historical Provider, MD   Past Medical History  Diagnosis Date  . Cardiac tamponade   . Aortic dissection (HCC)     TYPE 3  . HTN (hypertension)   . CAD (coronary artery disease)   . Hypercholesterolemia   . Chronic back pain   . Hiatal hernia   . Schatzki's ring   . Chronic fatigue   . Chronic pain   . Peptic ulcer disease 08/2010    EGD   .  COPD (chronic obstructive pulmonary disease) ([place])   . Chronic bronchitis [facility])    Past Surgical History  Procedure Laterality Date  . Sternal wire removeal  12/31/2007    [facility]ENDRICKSON  . Cabg x 6  07/31/2007    HENDRICKSON  . Cardiac catheterization    . Knee surgery Right     acl  . Shoulder surgery Left   . Orif scapular fracture Right   . Back surgery    . Olecranon bursectomy Left 08/27/2012     Procedure: EXCISION LEFT OLECRANON BURSA;  Surgeon: Sanjuana Kava, MD;  Location: [facility];  Service: Orthopedics;  Laterality: Left;  . Olecranon bursectomy Left 05/25/2013    Procedure:  Excision sinus tract and tissue left elbow ;  Surgeon: Sanjuana Kava, MD;  Location: [facility];  Service: Orthopedics;  Laterality: Left;   Family History  Problem Relation Age of Onset  . Heart disease Mother   . Heart disease Father    Social History   Social History  . Marital Status: Married    Spouse Name: N/A  . Number of Children: N/A  . Years of Education: N/A   Occupational History  . Dealer    Social History Main Topics  . Smoking status: Current Some Day Smoker -- 0.50 packs/day for 50 years    Types: Cigarettes  . Smokeless tobacco: Never Used  . Alcohol Use: No  . Drug Use: No  . Sexual Activity: Yes    Birth Control/ Protection: None   Other Topics Concern  . Not on file   Social History Narrative   ROS-see HPI   Negative unless "+" Constitutional:    weight loss, night sweats, fevers, chills, fatigue, lassitude. HEENT:    headaches, difficulty swallowing, tooth/dental problems, sore throat,       sneezing, itching, ear ache, nasal congestion, post nasal drip, snoring CV:    chest pain, orthopnea, PND, swelling in lower extremities, anasarca,                                                        dizziness, palpitations Resp:  + shortness of breath with exertion or at rest.                productive cough,   + non-productive cough, coughing up of blood.              change in color of mucus.  wheezing.   Skin:    rash or lesions. GI:  No-   heartburn, indigestion, abdominal pain, nausea, vomiting, diarrhea,                 change in bowel habits, loss of appetite GU: dysuria, change in color of urine, no urgency or frequency.   flank pain. MS:   joint pain, stiffness, decreased range of motion, + back pain. Neuro-     nothing unusual Psych:  change in mood or affect.   depression or anxiety.   memory loss.  OBJ- Physical Exam General- Alert, Oriented, + depressed, Distress- none acute, + strong odor of tobacco Skin- + excoriated area right paravertebral low back compatible with shingles but he says fentanyl patch caused topical reaction. Lymphadenopathy- none Head- atraumatic            Eyes- Gross vision intact, PERRLA, conjunctivae and  secretions clear            Ears- Hearing, canals-normal            Nose- Clear, no-Septal dev, mucus, polyps, erosion, perforation             Throat- Mallampati II , mucosa clear , drainage- none, tonsils- atrophic, + upper dentures, lower posts Neck- flexible , trachea midline, no stridor , thyroid nl, carotid no bruit Chest - symmetrical excursion , unlabored           Heart/CV- RRR , no murmur , no gallop  , no rub, nl s1 s2                           - JVD- none , edema- none, stasis changes- none, varices- none           Lung- clear to P&A, wheeze- none, cough- none , dullness-none, rub- none           Chest wall-  Abd-  Br/ Gen/ Rectal- Not done, not indicated Extrem- cyanosis- none, clubbing, none, atrophy- none, strength- nl Neuro- grossly intact to observation

## 2014-11-14 NOTE — Patient Instructions (Signed)
Order- CXR  cx COPD mixed type  Order- schedule PFT  PLEASE stop smoking  Try placing your fentanyl patch somewhere different to see if the rash follows  Order- lab- alpha 1 antitrypsin assay (both amount and phenotype)  Flu vax  Pneumovax  Sample Anoro Ellipta inhaler   Inhale 1 puff every day- maintenance. You can still use your rescue inhaler as before.

## 2014-11-23 ENCOUNTER — Encounter: Payer: Self-pay | Admitting: Internal Medicine

## 2014-11-23 NOTE — Assessment & Plan Note (Signed)
Managed by cardiology. Recognize component of his exertional dyspnea may be cardiac related although EF reported 60%

## 2014-11-23 NOTE — Assessment & Plan Note (Signed)
He indicates he continues to smoke. Availability of support and strong counseling to stop completely were emphasized

## 2014-11-23 NOTE — Assessment & Plan Note (Signed)
Probable COPD mixed type all of bronchitis component is not dominant now. Plan-chest x-ray, PFT, lab for alpha-1 antitrypsin level, flu vaccine, pneumonia vaccine, sample Anoro Ellipta inhaler

## 2014-11-30 ENCOUNTER — Ambulatory Visit: Payer: Medicare PPO | Admitting: Cardiology

## 2014-12-04 ENCOUNTER — Emergency Department (HOSPITAL_COMMUNITY)
Admission: EM | Admit: 2014-12-04 | Discharge: 2014-12-04 | Disposition: A | Discharge status: Home / Self Care | Payer: Medicare PPO | Attending: Emergency Medicine | Admitting: Emergency Medicine

## 2014-12-04 ENCOUNTER — Encounter (HOSPITAL_COMMUNITY): Payer: Self-pay

## 2014-12-04 ENCOUNTER — Emergency Department (HOSPITAL_COMMUNITY)
Admission: EM | Admit: 2014-12-04 | Discharge: 2014-12-04 | Disposition: A | Discharge status: Home / Self Care | Payer: Medicare PPO | Source: Home / Self Care | Attending: Emergency Medicine | Admitting: Emergency Medicine

## 2014-12-04 DIAGNOSIS — J449 Chronic obstructive pulmonary disease, unspecified: Secondary | ICD-10-CM

## 2014-12-04 DIAGNOSIS — I2510 Atherosclerotic heart disease of native coronary artery without angina pectoris: Secondary | ICD-10-CM | POA: Insufficient documentation

## 2014-12-04 DIAGNOSIS — Z8719 Personal history of other diseases of the digestive system: Secondary | ICD-10-CM | POA: Diagnosis not present

## 2014-12-04 DIAGNOSIS — E7800 Pure hypercholesterolemia, unspecified: Secondary | ICD-10-CM | POA: Insufficient documentation

## 2014-12-04 DIAGNOSIS — F17210 Nicotine dependence, cigarettes, uncomplicated: Secondary | ICD-10-CM | POA: Insufficient documentation

## 2014-12-04 DIAGNOSIS — Z8711 Personal history of peptic ulcer disease: Secondary | ICD-10-CM | POA: Insufficient documentation

## 2014-12-04 DIAGNOSIS — I10 Essential (primary) hypertension: Secondary | ICD-10-CM | POA: Insufficient documentation

## 2014-12-04 DIAGNOSIS — M549 Dorsalgia, unspecified: Secondary | ICD-10-CM | POA: Diagnosis not present

## 2014-12-04 DIAGNOSIS — G8929 Other chronic pain: Secondary | ICD-10-CM

## 2014-12-04 DIAGNOSIS — Z79899 Other long term (current) drug therapy: Secondary | ICD-10-CM | POA: Insufficient documentation

## 2014-12-04 MED ORDER — OXYCODONE HCL 5 MG PO TABS: 30.0000 mg | ORAL_TABLET | Freq: Once | ORAL | Status: DC

## 2014-12-04 MED ORDER — OXYCODONE HCL 5 MG PO TABS
30.0000 mg | ORAL_TABLET | Freq: Once | ORAL | Status: AC
  Administered 2014-12-04: 30 mg via ORAL
  Filled 2014-12-04: qty 6

## 2014-12-04 NOTE — Discharge Instructions (Signed)
Chronic Pain Discharge Instructions  °Emergency care providers appreciate that many patients coming to us are in severe pain and we wish to address their pain in the safest, most responsible manner.  It is important to recognize however, that the proper treatment of chronic pain differs from that of the pain of injuries and acute illnesses.  Our goal is to provide quality, safe, personalized care and we thank you for giving us the opportunity to serve you. °The use of narcotics and related agents for chronic pain syndromes may lead to additional physical and psychological problems.  Nearly as many people die from prescription narcotics each year as die from car crashes.  Additionally, this risk is increased if such prescriptions are obtained from a variety of sources.  Therefore, only your primary care physician or a pain management specialist is able to safely treat such syndromes with narcotic medications long-term.   ° °Documentation revealing such prescriptions have been sought from multiple sources may prohibit us from providing a refill or different narcotic medication.  Your name may be checked first through the [place] Controlled Substances Reporting System.  This database is a record of controlled substance medication prescriptions that the patient has received.  This has been established by [facility] in an effort to eliminate the dangerous, and often life threatening, practice of obtaining multiple prescriptions from different medical providers.  ° °If you have a chronic pain syndrome (i.e. chronic headaches, recurrent back or neck pain, dental pain, abdominal or pelvis pain without a specific diagnosis, or neuropathic pain such as fibromyalgia) or recurrent visits for the same condition without an acute diagnosis, you may be treated with non-narcotics and other non-addictive medicines.  Allergic reactions or negative side effects that may be reported by a patient to such medications will not  typically lead to the use of a narcotic analgesic or other controlled substance as an alternative. °  °Patients managing chronic pain with a personal physician should have provisions in place for breakthrough pain.  If you are in crisis, you should call your physician.  If your physician directs you to the emergency department, please have the doctor call and speak to our attending physician concerning your care. °  °When patients come to the Emergency Department (ED) with acute medical conditions in which the Emergency Department physician feels appropriate to prescribe narcotic or sedating pain medication, the physician will prescribe these in very limited quantities.  The amount of these medications will last only until you can see your primary care physician in his/her office.  Any patient who returns to the ED seeking refills should expect only non-narcotic pain medications.  ° °In the event of an acute medical condition exists and the emergency physician feels it is necessary that the patient be given a narcotic or sedating medication -  a responsible adult driver should be present in the room prior to the medication being given by the nurse. °  °Prescriptions for narcotic or sedating medications that have been lost, stolen or expired will not be refilled in the Emergency Department.   ° °Patients who have chronic pain may receive non-narcotic prescriptions until seen by their primary care physician.  It is every patient’s personal responsibility to maintain active prescriptions with his or her primary care physician or specialist. °

## 2014-12-04 NOTE — ED Notes (Signed)
Patient verbalizes understanding of discharge instructions, home care and pain management. Patient ambulatory out of department at this time

## 2014-12-04 NOTE — ED Notes (Signed)
Patient c/o lower back pain and bilateral hip pain. Patient has chronic back pain and the pain medicine he has at home isnt helping his pain.

## 2014-12-04 NOTE — ED Provider Notes (Signed)
CSN: [id number]     Arrival date & time 12/04/14  0355 History   First MD Initiated Contact with Patient 12/04/14 0422     Chief Complaint  Patient presents with  . Back Pain     HPI Complains of severe low back pain with radiation of his bilateral hips.  Has a long-standing history of chronic back pain.  He states that his had no patches have been giving him skin irritation and thus he discontinued his fentanyl patches several days ago is now having worsening pain in his low back.  Denies abdominal pain.  No fevers or chills.  Denies new weakness in his arms or legs.  No other complaints.  Patient is chronically on oxycodone as well as a fentanyl patch   Past Medical History  Diagnosis Date  . Cardiac tamponade   . Aortic dissection (HCC)     TYPE 3  . HTN (hypertension)   . CAD (coronary artery disease)   . Hypercholesterolemia   . Chronic back pain   . Hiatal hernia   . Schatzki's ring   . Chronic fatigue   . Chronic pain   . Peptic ulcer disease 08/2010    EGD   . COPD (chronic obstructive pulmonary disease) ([place])   . Chronic bronchitis [facility])    Past Surgical History  Procedure Laterality Date  . Sternal wire removeal  12/31/2007    HENDRICKSON  . Cabg x 6  07/31/2007    HENDRICKSON  . Cardiac catheterization    . Knee surgery Right     acl  . Shoulder surgery Left   . Orif scapular fracture Right   . Back surgery    . Olecranon bursectomy Left 08/27/2012    Procedure: EXCISION LEFT OLECRANON BURSA;  Surgeon: Sanjuana Kava, MD;  Location: [facility];  Service: Orthopedics;  Laterality: Left;  . Olecranon bursectomy Left 05/25/2013    Procedure:  Excision sinus tract and tissue left elbow ;  Surgeon: Sanjuana Kava, MD;  Location: [facility];  Service: Orthopedics;  Laterality: Left;   Family History  Problem Relation Age of Onset  . Heart disease Mother   . Heart disease Father    Social History  Substance Use Topics  . Smoking status: Current Some Day Smoker -- 0.50  packs/day for 50 years    Types: Cigarettes  . Smokeless tobacco: Never Used  . Alcohol Use: No    Review of Systems  All other systems reviewed and are negative.     Allergies  Codeine  Home Medications   Prior to Admission medications   Medication Sig Start Date End Date Taking? Authorizing Provider  ezetimibe (ZETIA) 10 MG tablet Take 10 mg by mouth daily.    Historical Provider, MD  fentaNYL (DURAGESIC - DOSED MCG/HR) 50 MCG/HR Place 1 patch onto the skin every 3 (three) days.  09/04/10   Historical Provider, MD  isosorbide mononitrate (IMDUR) 60 MG 24 hr tablet TAKE ONE TABLET BY MOUTH TWICE DAILY 05/16/14   Jerline Pain, MD  metoprolol tartrate (LOPRESSOR) 25 MG tablet TAKE ONE TABLET BY MOUTH TWICE DAILY 01/03/14   Jerline Pain, MD  metoprolol tartrate (LOPRESSOR) 25 MG tablet TAKE ONE TABLET BY MOUTH TWICE DAILY 10/19/14   Jerline Pain, MD  nitroGLYCERIN (NITROSTAT) 0.4 MG SL tablet Place 0.4 mg under the tongue every 5 (five) minutes as needed. Chest pain    Historical Provider, MD  Omega-3 Fatty Acids (FISH OIL) 1000 MG CAPS Take 1  capsule by mouth daily.    Historical Provider, MD  oxycodone (ROXICODONE) 30 MG immediate release tablet Take 1 tablet by mouth every 4 (four) hours as needed. pain 04/07/14   Historical Provider, MD  pantoprazole (PROTONIX) 40 MG tablet Take 40 mg by mouth 2 (two) times daily.  08/08/10   Historical Provider, MD  PROAIR HFA 108 (90 BASE) MCG/ACT inhaler Take 2 puffs by mouth as needed. Lung disease 03/10/14   Historical Provider, MD  rosuvastatin (CRESTOR) 20 MG tablet Take 20 mg by mouth daily.    Historical Provider, MD  sertraline (ZOLOFT) 50 MG tablet Take 50 mg by mouth 2 (two) times daily.    Historical Provider, MD   BP 189/99 mmHg  Pulse 76  Temp(Src) 98.4 F (36.9 C) (Oral)  Resp 18  Ht 5\' 10"  (1.778 m)  Wt 155 lb (70.308 kg)  BMI 22.24 kg/m2  SpO2 100% Physical Exam  Constitutional: He is oriented to person, place, and time. He  appears well-developed and well-nourished.  HENT:  Head: Normocephalic.  Eyes: EOM are normal.  Cardiovascular: Normal rate.   Pulmonary/Chest: Effort normal.  Abdominal: Soft. He exhibits no distension. There is no tenderness.  Musculoskeletal: Normal range of motion.  Normal gait.  Full range of motion bilateral hips and knees.  No thoracic or lumbar point tenderness.  Mild paralumbar tenderness bilaterally  Neurological: He is alert and oriented to person, place, and time.  Skin: Skin is warm and dry.  Psychiatric: He has a normal mood and affect. Judgment normal.  Nursing note and vitals reviewed.   ED Course  Procedures (including critical care time) Labs Review Labs Reviewed - No data to display  Imaging Review No results found. I have personally reviewed and evaluated these images and lab results as part of my medical decision-making.   EKG Interpretation None      MDM   Final diagnoses:  Chronic pain   Chronic pain exacerbation.  No life-threatening emergency.  Discharge home in good condition to follow-up with his neurosurgeon and his primary care physician.  Doubt intra-abdominal pathology.  No indication for imaging at this time.  Vital signs are normal.    Jola Schmidt, MD 12/04/14 [phone]

## 2014-12-04 NOTE — ED Notes (Signed)
Patient verbalizes understanding of discharge instructions, home care and follow up care. Patient ambulatory out of department at this time. 

## 2014-12-04 NOTE — ED Provider Notes (Signed)
CSN: [id number]     Arrival date & time 12/04/14  [id number] History   None    Chief Complaint  Patient presents with  . Back Pain      HPI Patient was just in the emergency department for recurrent chronic back pain.  He was unable to obtain a driver initially and therefore could not receive his home dose of pain medication for exacerbating back pain.  He returns the emergency department now with a driver who can drive him home safely.  He has no new complaints   Past Medical History  Diagnosis Date  . Cardiac tamponade   . Aortic dissection (HCC)     TYPE 3  . HTN (hypertension)   . CAD (coronary artery disease)   . Hypercholesterolemia   . Chronic back pain   . Hiatal hernia   . Schatzki's ring   . Chronic fatigue   . Chronic pain   . Peptic ulcer disease 08/2010    EGD   . COPD (chronic obstructive pulmonary disease) ([place])   . Chronic bronchitis [facility])    Past Surgical History  Procedure Laterality Date  . Sternal wire removeal  12/31/2007    HENDRICKSON  . Cabg x 6  07/31/2007    HENDRICKSON  . Cardiac catheterization    . Knee surgery Right     acl  . Shoulder surgery Left   . Orif scapular fracture Right   . Back surgery    . Olecranon bursectomy Left 08/27/2012    Procedure: EXCISION LEFT OLECRANON BURSA;  Surgeon: Sanjuana Kava, MD;  Location: [facility];  Service: Orthopedics;  Laterality: Left;  . Olecranon bursectomy Left 05/25/2013    Procedure:  Excision sinus tract and tissue left elbow ;  Surgeon: Sanjuana Kava, MD;  Location: [facility];  Service: Orthopedics;  Laterality: Left;   Family History  Problem Relation Age of Onset  . Heart disease Mother   . Heart disease Father    Social History  Substance Use Topics  . Smoking status: Current Some Day Smoker -- 0.50 packs/day for 50 years    Types: Cigarettes  . Smokeless tobacco: Never Used  . Alcohol Use: No    Review of Systems  Constitutional: Negative for fever.  Neurological: Negative for weakness.       Allergies  Codeine  Home Medications   Prior to Admission medications   Medication Sig Start Date End Date Taking? Authorizing Provider  ezetimibe (ZETIA) 10 MG tablet Take 10 mg by mouth daily.    Historical Provider, MD  fentaNYL (DURAGESIC - DOSED MCG/HR) 50 MCG/HR Place 1 patch onto the skin every 3 (three) days.  09/04/10   Historical Provider, MD  isosorbide mononitrate (IMDUR) 60 MG 24 hr tablet TAKE ONE TABLET BY MOUTH TWICE DAILY 05/16/14   Jerline Pain, MD  metoprolol tartrate (LOPRESSOR) 25 MG tablet TAKE ONE TABLET BY MOUTH TWICE DAILY 01/03/14   Jerline Pain, MD  metoprolol tartrate (LOPRESSOR) 25 MG tablet TAKE ONE TABLET BY MOUTH TWICE DAILY 10/19/14   Jerline Pain, MD  nitroGLYCERIN (NITROSTAT) 0.4 MG SL tablet Place 0.4 mg under the tongue every 5 (five) minutes as needed. Chest pain    Historical Provider, MD  Omega-3 Fatty Acids (FISH OIL) 1000 MG CAPS Take 1 capsule by mouth daily.    Historical Provider, MD  oxycodone (ROXICODONE) 30 MG immediate release tablet Take 1 tablet by mouth every 4 (four) hours as needed. pain 04/07/14   Historical  Provider, MD  pantoprazole (PROTONIX) 40 MG tablet Take 40 mg by mouth 2 (two) times daily.  08/08/10   Historical Provider, MD  PROAIR HFA 108 (90 BASE) MCG/ACT inhaler Take 2 puffs by mouth as needed. Lung disease 03/10/14   Historical Provider, MD  rosuvastatin (CRESTOR) 20 MG tablet Take 20 mg by mouth daily.    Historical Provider, MD  sertraline (ZOLOFT) 50 MG tablet Take 50 mg by mouth 2 (two) times daily.    Historical Provider, MD   BP 168/110 mmHg  Pulse 61  Temp(Src) 97.5 F (36.4 C) (Oral)  Resp 18  Ht 5\' 10"  (1.778 m)  Wt 155 lb (70.308 kg)  BMI 22.24 kg/m2  SpO2 100% Physical Exam  Constitutional: He is oriented to person, place, and time. He appears well-developed and well-nourished.  HENT:  Head: Normocephalic.  Eyes: EOM are normal.  Neck: Normal range of motion.  Pulmonary/Chest: Effort normal.   Abdominal: He exhibits no distension.  Musculoskeletal: Normal range of motion.  Neurological: He is alert and oriented to person, place, and time.  Psychiatric: He has a normal mood and affect.  Nursing note and vitals reviewed.   ED Course  Procedures (including critical care time) Labs Review Labs Reviewed - No data to display  Imaging Review No results found. I have personally reviewed and evaluated these images and lab results as part of my medical decision-making.   EKG Interpretation None      MDM   Final diagnoses:  Chronic back pain    Patient was given his home dose of oxycodone.  He'll be discharged home to follow-up with his primary care physician.  Ambulatory.    Jola Schmidt, MD 12/04/14 [phone]

## 2014-12-04 NOTE — ED Notes (Signed)
C/o lower back pain into bilateral hips. 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

## 2014-12-05 ENCOUNTER — Encounter: Payer: Self-pay | Admitting: Cardiology

## 2014-12-18 ENCOUNTER — Emergency Department (HOSPITAL_COMMUNITY): Payer: Medicare PPO

## 2014-12-18 ENCOUNTER — Encounter (HOSPITAL_COMMUNITY): Payer: Self-pay | Admitting: *Deleted

## 2014-12-18 ENCOUNTER — Emergency Department (HOSPITAL_COMMUNITY)
Admission: EM | Admit: 2014-12-18 | Discharge: 2014-12-18 | Disposition: A | Discharge status: Home / Self Care | Payer: Medicare PPO | Attending: Emergency Medicine | Admitting: Emergency Medicine

## 2014-12-18 DIAGNOSIS — R29898 Other symptoms and signs involving the musculoskeletal system: Secondary | ICD-10-CM

## 2014-12-18 DIAGNOSIS — Z8711 Personal history of peptic ulcer disease: Secondary | ICD-10-CM | POA: Insufficient documentation

## 2014-12-18 DIAGNOSIS — J449 Chronic obstructive pulmonary disease, unspecified: Secondary | ICD-10-CM | POA: Diagnosis not present

## 2014-12-18 DIAGNOSIS — F32A Depression, unspecified: Secondary | ICD-10-CM

## 2014-12-18 DIAGNOSIS — Z79899 Other long term (current) drug therapy: Secondary | ICD-10-CM | POA: Diagnosis not present

## 2014-12-18 DIAGNOSIS — F329 Major depressive disorder, single episode, unspecified: Secondary | ICD-10-CM

## 2014-12-18 DIAGNOSIS — M4806 Spinal stenosis, lumbar region: Secondary | ICD-10-CM | POA: Diagnosis not present

## 2014-12-18 DIAGNOSIS — M48061 Spinal stenosis, lumbar region without neurogenic claudication: Secondary | ICD-10-CM

## 2014-12-18 DIAGNOSIS — M6281 Muscle weakness (generalized): Secondary | ICD-10-CM | POA: Insufficient documentation

## 2014-12-18 DIAGNOSIS — R200 Anesthesia of skin: Secondary | ICD-10-CM | POA: Diagnosis present

## 2014-12-18 DIAGNOSIS — F17210 Nicotine dependence, cigarettes, uncomplicated: Secondary | ICD-10-CM | POA: Diagnosis not present

## 2014-12-18 DIAGNOSIS — I10 Essential (primary) hypertension: Secondary | ICD-10-CM | POA: Insufficient documentation

## 2014-12-18 DIAGNOSIS — G8929 Other chronic pain: Secondary | ICD-10-CM | POA: Insufficient documentation

## 2014-12-18 DIAGNOSIS — I2510 Atherosclerotic heart disease of native coronary artery without angina pectoris: Secondary | ICD-10-CM | POA: Diagnosis not present

## 2014-12-18 LAB — CBC WITH DIFFERENTIAL/PLATELET
Basophils Absolute: 0 K/uL (ref 0.0–0.1)
Basophils Relative: 1 %
Eosinophils Absolute: 0.1 K/uL (ref 0.0–0.7)
Eosinophils Relative: 2 %
HCT: 36.1 % — ABNORMAL LOW (ref 39.0–52.0)
Hemoglobin: 12.2 g/dL — ABNORMAL LOW (ref 13.0–17.0)
Lymphocytes Relative: 31 %
Lymphs Abs: 1.3 K/uL (ref 0.7–4.0)
MCH: 32.4 pg (ref 26.0–34.0)
MCHC: 33.8 g/dL (ref 30.0–36.0)
MCV: 95.8 fL (ref 78.0–100.0)
Monocytes Absolute: 0.3 K/uL (ref 0.1–1.0)
Monocytes Relative: 7 %
Neutro Abs: 2.4 K/uL (ref 1.7–7.7)
Neutrophils Relative %: 59 %
Platelets: 156 K/uL (ref 150–400)
RBC: 3.77 MIL/uL — ABNORMAL LOW (ref 4.22–5.81)
RDW: 14.4 % (ref 11.5–15.5)
WBC: 4 K/uL (ref 4.0–10.5)

## 2014-12-18 LAB — BASIC METABOLIC PANEL WITH GFR
Anion gap: 10 (ref 5–15)
BUN: 6 mg/dL (ref 6–20)
CO2: 29 mmol/L (ref 22–32)
Calcium: 8.4 mg/dL — ABNORMAL LOW (ref 8.9–10.3)
Chloride: 96 mmol/L — ABNORMAL LOW (ref 101–111)
Creatinine, Ser: 0.89 mg/dL (ref 0.61–1.24)
GFR calc Af Amer: >60 mL/min
GFR calc non Af Amer: >60 mL/min
Glucose, Bld: 96 mg/dL (ref 65–99)
Potassium: 3.5 mmol/L (ref 3.5–5.1)
Sodium: 135 mmol/L (ref 135–145)

## 2014-12-18 MED ORDER — GADOBENATE DIMEGLUMINE 529 MG/ML IV SOLN
15.0000 mL | Freq: Once | INTRAVENOUS | Status: AC | PRN
  Administered 2014-12-18: 15 mL via INTRAVENOUS

## 2014-12-18 MED ORDER — DEXAMETHASONE SODIUM PHOSPHATE 10 MG/ML IJ SOLN
10.0000 mg | Freq: Once | INTRAMUSCULAR | Status: AC
  Administered 2014-12-18: 10 mg via INTRAVENOUS
  Filled 2014-12-18: qty 1

## 2014-12-18 MED ORDER — HYDROMORPHONE HCL 1 MG/ML IJ SOLN
1.0000 mg | Freq: Once | INTRAMUSCULAR | Status: AC
  Administered 2014-12-18: 1 mg via INTRAVENOUS
  Filled 2014-12-18: qty 1

## 2014-12-18 MED ORDER — PREDNISONE 10 MG (21) PO TBPK: 10.0000 mg | ORAL_TABLET | Freq: Every day | ORAL | Status: DC

## 2014-12-18 MED ORDER — CYCLOBENZAPRINE HCL 10 MG PO TABS: 10.0000 mg | ORAL_TABLET | Freq: Two times a day (BID) | ORAL | Status: DC | PRN

## 2014-12-18 MED ORDER — SODIUM CHLORIDE 0.9 % IV BOLUS (SEPSIS)
500.0000 mL | Freq: Once | INTRAVENOUS | Status: AC
  Administered 2014-12-18: 500 mL via INTRAVENOUS

## 2014-12-18 NOTE — ED Notes (Signed)
Pt c/o bil leg numbness onset x 1 mth, pt reports falling multiple times d/t not being able to move his legs, denies bowel & bladder incontinence, pt family reports last fall yesterday without LOC, pt c/o SOB worsening the last few mths, pt A&O x4

## 2014-12-18 NOTE — Care Management Note (Signed)
Case Management Note  Patient Details  Name: GUMESINDO BLANCETT MRN: [id number] Date of Birth: [birth year]  Subjective/Objective:  64 y.o. M seen in the ED for pain related to DDD. CM consulted by EDP for [facility] and possible DME. When CM discussed possible Agencies and services with pt and spouse he reported he saw no need for either.                   Action/Plan: CM offered [facility] services and made pt aware that he would be eligible for [facility] and DME if he changed his mind in the future. Both he and his wife expressed understanding and appreciation.    Expected Discharge Date:                  Expected Discharge Plan:  Home/Self Care  In-House Referral:     Discharge planning Services  CM Consult  Post Acute Care Choice:    Choice offered to:  Patient, Spouse  DME Arranged:    DME Agency:     HH Arranged:    [place] Agency:     Status of Service:  Completed, signed off  Medicare Important Message Given:    Date Medicare IM Given:    Medicare IM give by:    Date Additional Medicare IM Given:    Additional Medicare Important Message give by:     If discussed at [place] of Stay Meetings, dates discussed:    Additional Comments:  Delrae Sawyers, RN 12/18/2014, 6:09 PM

## 2014-12-18 NOTE — Discharge Instructions (Signed)
Spinal Stenosis Spinal stenosis is an abnormal narrowing of the canals of your spine (vertebrae). CAUSES  Spinal stenosis is caused by areas of bone pushing into the central canals of your vertebrae. This condition can be present at birth (congenital). It also may be caused by arthritic deterioration of your vertebrae (spinal degeneration).  SYMPTOMS   Pain that is generally worse with activities, particularly standing and walking.  Numbness, tingling, hot or cold sensations, weakness, or weariness in your legs.  Frequent episodes of falling.  A foot-slapping gait that leads to muscle weakness. DIAGNOSIS  Spinal stenosis is diagnosed with the use of magnetic resonance imaging (MRI) or computed tomography (CT). TREATMENT  Initial therapy for spinal stenosis focuses on the management of the pain and other symptoms associated with the condition. These therapies include:  Practicing postural changes to lessen pressure on your nerves.  Exercises to strengthen the core of your body.  Loss of excess body weight.  The use of nonsteroidal anti-inflammatory medicines to reduce swelling and inflammation in your nerves. When therapies to manage pain are not successful, surgery to treat spinal stenosis may be recommended. This surgery involves removing excess bone, which puts pressure on your nerve roots. During this surgery (laminectomy), the posterior boney arch (lamina) and excess bone around the facet joints are removed.   This information is not intended to replace advice given to you by your health care provider. Make sure you discuss any questions you have with your health care provider.   Document Released: 03/30/2003 Document Revised: 01/28/2014 Document Reviewed: 04/17/2012 Elsevier Interactive Patient Education 2016 [place].  Surgical Spinal Decompression Spinal decompression is a surgical procedure that creates more space for the spinal cord. It is done to relieve pressure on  the spinal cord and spinal nerves when the pressure causes symptoms, such as:  Severe pain.  Weakness.  Numbness.  Trouble emptying or controlling your bladder and bowel. There are several types of spinal decompression. They include:  Laminectomy. This type is done to remove the bony arch at the back of the bones of the spine (vertebrae) that forms the spinal canal.  Diskectomy. This type is done to remove the disks between your vertebrae.  Microdiskectomy. This type is done to remove part of a spinal disk.  Foraminotomy. This type is done to widen the bony passage that spinal nerves pass through.  Corpectomy. This type is done to remove a vertebra. Spinal decompression may be done along with a procedure to make two or more vertebrae grow together (spinal fusion), if the spinal decompression makes the spine unstable. LET [facility] CARE PROVIDER KNOW ABOUT:  Any allergies you have.  All medicines you are taking, including vitamins, herbs, eyedrops, creams, and over-the-counter medicines.  Previous problems you or members of your family have had with the use of anesthetics.  Any blood disorders you have.  Previous surgeries you have had.  Any medical conditions you have, including the possibility of pregnancy. RISKS AND COMPLICATIONS Generally, this is a safe procedure. However, problems may occur, including:  Bleeding.  Damage to:  Blood vessels.  Nerves.  The covering of the spinal cord (meninges).  The spinal cord.  Infection.  Blood clots.  Failed surgery.  Need for more surgery. BEFORE THE PROCEDURE  Do not use any tobacco products for at least two weeks before the procedure. This includes cigarettes, chewing tobacco, and electronic cigarettes. These products can increase your risk for complications and slow down the healing process.  Ask your  health care provider about:  Changing or stopping your regular medicines. This is especially important if  you are taking diabetes medicines or blood thinners.  Taking medicines such as aspirin and ibuprofen. These medicines can thin your blood. Do not take these medicines before your procedure if your health care provider instructs you not to.  You may have a test called a diskogram. In this test, dye is injected into the back and X-rays are taken.  Follow instructions from your health care provider about eating or drinking restrictions. PROCEDURE  An IV line will be started in your arm or hand.  You will be given one or more of the following:  A medicine that makes you fall asleep (general anesthetic).  A medicine that is injected into your spine that numbs the area below and slightly above the injection site (spinal anesthetic).  An incision will be made near your spine. If the affected part of the spine is in the neck, the incision may be made in the front or back of the neck. The length of the incision will depend on how many vertebrae and discs are affected and whether spinal fusion is needed.  The muscles and other tissues around your spine will be carefully moved out of the way.  The appropriate type of spinal compression will be done.  If needed, a spinal fusion will be done.  Your incision or incisions will be closed and covered with a bandage (dressing). The procedure may vary among health care providers and hospitals. AFTER THE PROCEDURE  You may need to stay in the hospital for a day or two.  Your blood pressure, heart rate, breathing rate, and blood oxygen level will be monitored regularly.  You will receive pain medicine as needed.  You may be instructed to take a stool softener to prevent constipation.  Your IV will be removed when you are able to eat a normal diet.  A small drain may be placed close to your incision to prevent fluid or blood from pooling in your incision. It will be removed within 1-2 days.  You may be encouraged to get up and walk around soon  after your procedure.   This information is not intended to replace advice given to you by your health care provider. Make sure you discuss any questions you have with your health care provider.   Document Released: 01/27/2007 Document Revised: 09/28/2014 Document Reviewed: 01/10/2014 Elsevier Interactive Patient Education 2016 [place] therapy can help ease sore, stiff, injured, and tight muscles and joints. Heat relaxes your muscles, which may help ease your pain.  RISKS AND COMPLICATIONS If you have any of the following conditions, do not use heat therapy unless your health care provider has approved:  Poor circulation.  Healing wounds or scarred skin in the area being treated.  Diabetes, heart disease, or high blood pressure.  Not being able to feel (numbness) the area being treated.  Unusual swelling of the area being treated.  Active infections.  Blood clots.  Cancer.  Inability to communicate pain. This may include young children and people who have problems with their brain function (dementia).  Pregnancy. Heat therapy should only be used on old, pre-existing, or long-lasting (chronic) injuries. Do not use heat therapy on new injuries unless directed by your health care provider. HOW TO USE HEAT THERAPY There are several different kinds of heat therapy, including:  Moist heat pack.  Warm water bath.  Hot water bottle.  IT trainer  heating pad.  Heated gel pack.  Heated wrap.  Electric heating pad. Use the heat therapy method suggested by your health care provider. Follow your health care provider's instructions on when and how to use heat therapy. GENERAL HEAT THERAPY RECOMMENDATIONS  Do not sleep while using heat therapy. Only use heat therapy while you are awake.  Your skin may turn pink while using heat therapy. Do not use heat therapy if your skin turns red.  Do not use heat therapy if you have new pain.  High heat or long  exposure to heat can cause burns. Be careful when using heat therapy to avoid burning your skin.  Do not use heat therapy on areas of your skin that are already irritated, such as with a rash or sunburn. SEEK MEDICAL CARE IF:  You have blisters, redness, swelling, or numbness.  You have new pain.  Your pain is worse. MAKE SURE YOU:  Understand these instructions.  Will watch your condition.  Will get help right away if you are not doing well or get worse.   This information is not intended to replace advice given to you by your health care provider. Make sure you discuss any questions you have with your health care provider.   Follow up with your neurosurgeon as soon as possible for reevaluation. Encourage patient to use walker at home for ambulation. Take steroids and muscle relaxers for pain in addition to home pain medications as needed. Make appointment with pain management for chronic pain needs. Follow-up with primary care provider to establish care for depression and psychiatric needs. Return to the emergency department if you expands worsening of her symptoms, bowel or bladder incontinence, discoloration of extremity, inability to move your legs.

## 2014-12-18 NOTE — ED Notes (Signed)
Pt very tearful during all interaction with pt. Pt states that he can not hold a conversation without crying. He also made the statement that he is tired of living in Pain. PA-C [place] notified of concerns.

## 2014-12-18 NOTE — ED Notes (Signed)
Patient transported to MRI 

## 2014-12-18 NOTE — ED Notes (Signed)
Pt A&OX4, ambulatory at d/c with a steady gait but wheeled out of ED via wheelchair. Pt reports he misplaced his bottle of pain pills "oxycodone" and he cant find the bottle. No bottle of pills in the room and was told that if bottle was found then he would be notified.

## 2014-12-18 NOTE — ED Notes (Signed)
Pt was able to ambulate with moderate assistance with 2 people. Pt stated that the sensation in his feet and legs has decreased from normal. He can feel the floor however he does not have the same sensations as per norm. He also stated that he knows which foot he wants to move however sometimes it wont react or the wrong foot reacts. Pt needs assistance to walk and should not walk without assistance.

## 2014-12-23 NOTE — ED Provider Notes (Signed)
CSN: [id number]     Arrival date & time 12/18/14  1040 History   First MD Initiated Contact with Patient 12/18/14 1102     Chief Complaint  Patient presents with  . Numbness     (Consider location/radiation/quality/duration/timing/severity/associated sxs/prior Treatment) HPI   [name] is a 64 y.o m with a pmhx of a known type III aortic dissection (non-surgical candidate), HTN, chronic back pain s/p lumbar spinal fusion, COPD who presents the emergency room today complaining of progressively worsening bilateral lower extremity weakness. Patient states that over the last month his legs have become progressively weaker. Patient is having increased falls at home and due to this. Patient states "it feels like my legs don't belong to me". Patient states that he is unable to control his legs due to weakness and is very off balance because of this. He is associated numbness and paresthesias in both extremities. Patient also has associated back pain which is worsened from his baseline. Pt takes home oxycodone and fentanyl patches. Pt is very tearful on exam, doesn't know what to do about this anymore. Denies bowel/bladder incontinence, fever, IVDA, chills, abdominal pain, dizziness, lightheadedness, syncope.   Past Medical History  Diagnosis Date  . Cardiac tamponade   . Aortic dissection (HCC)     TYPE 3  . HTN (hypertension)   . CAD (coronary artery disease)   . Hypercholesterolemia   . Chronic back pain   . Hiatal hernia   . Schatzki's ring   . Chronic fatigue   . Chronic pain   . Peptic ulcer disease 08/2010    EGD   . COPD (chronic obstructive pulmonary disease) ([place])   . Chronic bronchitis [facility])    Past Surgical History  Procedure Laterality Date  . Sternal wire removeal  12/31/2007    HENDRICKSON  . Cabg x 6  07/31/2007    HENDRICKSON  . Cardiac catheterization    . Knee surgery Right     acl  . Shoulder surgery Left   . Orif scapular fracture Right   . Back surgery    .  Olecranon bursectomy Left 08/27/2012    Procedure: EXCISION LEFT OLECRANON BURSA;  Surgeon: Sanjuana Kava, MD;  Location: [facility];  Service: Orthopedics;  Laterality: Left;  . Olecranon bursectomy Left 05/25/2013    Procedure:  Excision sinus tract and tissue left elbow ;  Surgeon: Sanjuana Kava, MD;  Location: [facility];  Service: Orthopedics;  Laterality: Left;   Family History  Problem Relation Age of Onset  . Heart disease Mother   . Heart disease Father    Social History  Substance Use Topics  . Smoking status: Current Some Day Smoker -- 0.50 packs/day for 50 years    Types: Cigarettes  . Smokeless tobacco: Never Used  . Alcohol Use: No    Review of Systems  All other systems reviewed and are negative.     Allergies  Codeine  Home Medications   Prior to Admission medications   Medication Sig Start Date End Date Taking? Authorizing Provider  ezetimibe (ZETIA) 10 MG tablet Take 10 mg by mouth daily.   Yes Historical Provider, MD  fentaNYL (DURAGESIC - DOSED MCG/HR) 50 MCG/HR Place 1 patch onto the skin every 3 (three) days.  09/04/10  Yes Historical Provider, MD  isosorbide mononitrate (IMDUR) 60 MG 24 hr tablet TAKE ONE TABLET BY MOUTH TWICE DAILY 05/16/14  Yes Jerline Pain, MD  metoprolol tartrate (LOPRESSOR) 25 MG tablet TAKE ONE TABLET BY MOUTH  TWICE DAILY 01/03/14  Yes Jerline Pain, MD  nitroGLYCERIN (NITROSTAT) 0.4 MG SL tablet Place 0.4 mg under the tongue every 5 (five) minutes as needed. Chest pain   Yes Historical Provider, MD  oxycodone (ROXICODONE) 30 MG immediate release tablet Take 1 tablet by mouth every 4 (four) hours as needed. pain 04/07/14  Yes Historical Provider, MD  pantoprazole (PROTONIX) 40 MG tablet Take 40 mg by mouth 2 (two) times daily.  08/08/10  Yes Historical Provider, MD  PROAIR HFA 108 (90 BASE) MCG/ACT inhaler Take 2 puffs by mouth every 6 (six) hours as needed for wheezing. Lung disease 03/10/14  Yes Historical Provider, MD  rosuvastatin (CRESTOR) 20  MG tablet Take 20 mg by mouth daily.   Yes Historical Provider, MD  sertraline (ZOLOFT) 50 MG tablet Take 50 mg by mouth 2 (two) times daily.   Yes Historical Provider, MD  cyclobenzaprine (FLEXERIL) 10 MG tablet Take 1 tablet (10 mg total) by mouth 2 (two) times daily as needed for muscle spasms. 12/18/14   Samantha Tripp Dowless, PA-C  metoprolol tartrate (LOPRESSOR) 25 MG tablet TAKE ONE TABLET BY MOUTH TWICE DAILY Patient not taking: Reported on 12/18/2014 10/19/14   Jerline Pain, MD  Omega-3 Fatty Acids (FISH OIL) 1000 MG CAPS Take 1 capsule by mouth daily.    Historical Provider, MD  predniSONE (STERAPRED UNI-PAK 21 TAB) 10 MG (21) TBPK tablet Take 1 tablet (10 mg total) by mouth daily. Take 6 tabs by mouth daily  for 2 days, then 5 tabs for 2 days, then 4 tabs for 2 days, then 3 tabs for 2 days, 2 tabs for 2 days, then 1 tab by mouth daily for 2 days 12/18/14   [place] Tripp Dowless, PA-C   BP 128/107 mmHg  Pulse 60  Temp(Src) 97.7 F (36.5 C) (Oral)  Resp 18  SpO2 94% Physical Exam  Constitutional: He is oriented to person, place, and time. He appears well-developed and well-nourished. No distress.  HENT:  Head: Normocephalic and atraumatic.  Mouth/Throat: Oropharynx is clear and moist. No oropharyngeal exudate.  Eyes: Conjunctivae and EOM are normal. Pupils are equal, round, and reactive to light. Right eye exhibits no discharge. Left eye exhibits no discharge. No scleral icterus.  Neck: Neck supple.  No meningismus.  Cardiovascular: Normal rate, regular rhythm, normal heart sounds and intact distal pulses.  Exam reveals no gallop and no friction rub.   No murmur heard. Pulmonary/Chest: Effort normal and breath sounds normal. No respiratory distress. He has no wheezes. He has no rales. He exhibits no tenderness.  Abdominal: Soft. Bowel sounds are normal. He exhibits no distension and no mass. There is no tenderness. There is no guarding.  Musculoskeletal: Normal range of motion.  He exhibits no edema.  Midline lumbar spinal tenderness.  Lymphadenopathy:    He has no cervical adenopathy.  Neurological: He is alert and oriented to person, place, and time. No cranial nerve deficit. He exhibits normal muscle tone. Coordination normal.  Strength 3/5 in bilateral LE. No sensory deficits. Very ataxic gait. Unsteady on his feet.   Skin: Skin is warm and dry. No rash noted. He is not diaphoretic. No erythema. No pallor.  Psychiatric: His behavior is normal.  Pt very tearful on exam.  Nursing note and vitals reviewed.   ED Course  Procedures (including critical care time) Labs Review Labs Reviewed  BASIC METABOLIC PANEL - Abnormal; Notable for the following:    Chloride 96 (*)    Calcium 8.4 (*)  All other components within normal limits  CBC WITH DIFFERENTIAL/PLATELET - Abnormal; Notable for the following:    RBC 3.77 (*)    Hemoglobin 12.2 (*)    HCT 36.1 (*)    All other components within normal limits    Imaging Review No results found. I have personally reviewed and evaluated these images and lab results as part of my medical decision-making.   EKG Interpretation None      MDM   Final diagnoses:  Leg weakness  Spinal stenosis of lumbar region  Depression    64 y.o M with past medical history of a type III aortic dissection, chronic back pain S/P lumbar spinal fusion presents with progressively worsening bilateral lower extremity weakness over the last month. Patient takes multiple home Roxicodone's per day in addition to fentanyl patches which are not controlling his pain at this time. Suspect that this is a chronic back pain exacerbation versus herniated disc however, due to pt's aortic dissection history concern that this may be worsening and potentially causing new neurological issues. Considering MRI lumbar spina vs CTA dissection study to evaluate. Spoke with radiologist who states that MRI lumbar spine is more appropriate to evaluate for  herniated disc and that he would be able to adequately evaluate the size of the aorta with this image as well.  Will proceed with MRI.  MRI reveals progressive adjacent segment disease at L3-4 with increased moderate spinal stenosis and moderate bilateral lateral recess and neural foraminal stenosis. Mild left lateral recess stenosis at L2-3, mildly increased from prior. Grossly unchanged appearance of the patient's known aortic dissection.  Spoke with neurosurgeon on-call who states that patient is able to follow up with his neurosurgeon as an outpatient. Patient sees Dr. Carloyn Manner. At this time patient does not meet inpatient criteria.  Spoke with case management who will arrange for patient to have a walker as well as home health care.  While case management is speaking to patient patient politely declines both the walker as well as home health care stating that he will be able to get around on his own at home without help with his wife and daughter. Patient states that he will follow-up with his neurosurgeon as soon as possible. Patient has home pain medication that he may take. Will give patient pain management referral. Suspect that psychiatric/depression is playing a role in the patient's current situation. Highly recommend follow-up with PCP to address these issues. Patient denies SI/HI. Discussed treatment plan with patient who is agreeable. Return precautions outlined in patient discharge instructions.  Patient was discussed with and seen by Dr. Alfonse Spruce who agrees with the treatment plan.      Dondra Spry [place], PA-C 12/23/14 [id number]  Harvel Quale, MD 12/23/14 1011

## 2014-12-28 ENCOUNTER — Ambulatory Visit: Payer: Medicare PPO | Admitting: Cardiology

## 2015-02-01 ENCOUNTER — Ambulatory Visit: Payer: Medicare PPO | Admitting: Cardiology

## 2015-02-08 ENCOUNTER — Encounter: Payer: Self-pay | Admitting: Cardiology

## 2015-02-27 ENCOUNTER — Other Ambulatory Visit: Payer: Self-pay | Admitting: Cardiology

## 2015-03-09 ENCOUNTER — Ambulatory Visit: Payer: Medicare PPO | Admitting: Cardiology

## 2015-03-14 ENCOUNTER — Emergency Department (HOSPITAL_COMMUNITY): Payer: Medicare PPO

## 2015-03-14 ENCOUNTER — Observation Stay (HOSPITAL_COMMUNITY): Payer: Medicare PPO

## 2015-03-14 ENCOUNTER — Encounter (HOSPITAL_COMMUNITY): Payer: Self-pay

## 2015-03-14 ENCOUNTER — Inpatient Hospital Stay (HOSPITAL_COMMUNITY)
Admission: EM | Admit: 2015-03-14 | Discharge: 2015-03-17 | DRG: 246 | Disposition: A | Discharge status: Home / Self Care | Payer: Medicare PPO | Attending: Cardiovascular Disease | Admitting: Cardiovascular Disease

## 2015-03-14 DIAGNOSIS — R778 Other specified abnormalities of plasma proteins: Secondary | ICD-10-CM

## 2015-03-14 DIAGNOSIS — Z79899 Other long term (current) drug therapy: Secondary | ICD-10-CM

## 2015-03-14 DIAGNOSIS — Z951 Presence of aortocoronary bypass graft: Secondary | ICD-10-CM

## 2015-03-14 DIAGNOSIS — R634 Abnormal weight loss: Secondary | ICD-10-CM | POA: Diagnosis present

## 2015-03-14 DIAGNOSIS — I25810 Atherosclerosis of coronary artery bypass graft(s) without angina pectoris: Secondary | ICD-10-CM | POA: Diagnosis present

## 2015-03-14 DIAGNOSIS — K219 Gastro-esophageal reflux disease without esophagitis: Secondary | ICD-10-CM | POA: Diagnosis present

## 2015-03-14 DIAGNOSIS — I7101 Dissection of thoracic aorta: Secondary | ICD-10-CM | POA: Diagnosis present

## 2015-03-14 DIAGNOSIS — F17210 Nicotine dependence, cigarettes, uncomplicated: Secondary | ICD-10-CM | POA: Diagnosis present

## 2015-03-14 DIAGNOSIS — J449 Chronic obstructive pulmonary disease, unspecified: Secondary | ICD-10-CM | POA: Diagnosis present

## 2015-03-14 DIAGNOSIS — I25110 Atherosclerotic heart disease of native coronary artery with unstable angina pectoris: Secondary | ICD-10-CM | POA: Diagnosis present

## 2015-03-14 DIAGNOSIS — I2510 Atherosclerotic heart disease of native coronary artery without angina pectoris: Secondary | ICD-10-CM

## 2015-03-14 DIAGNOSIS — Z720 Tobacco use: Secondary | ICD-10-CM

## 2015-03-14 DIAGNOSIS — R079 Chest pain, unspecified: Secondary | ICD-10-CM | POA: Diagnosis present

## 2015-03-14 DIAGNOSIS — R7989 Other specified abnormal findings of blood chemistry: Secondary | ICD-10-CM

## 2015-03-14 DIAGNOSIS — R0781 Pleurodynia: Secondary | ICD-10-CM

## 2015-03-14 DIAGNOSIS — Z8711 Personal history of peptic ulcer disease: Secondary | ICD-10-CM

## 2015-03-14 DIAGNOSIS — E785 Hyperlipidemia, unspecified: Secondary | ICD-10-CM | POA: Diagnosis present

## 2015-03-14 DIAGNOSIS — I10 Essential (primary) hypertension: Secondary | ICD-10-CM | POA: Diagnosis present

## 2015-03-14 DIAGNOSIS — I7100 Dissection of unspecified site of aorta: Secondary | ICD-10-CM

## 2015-03-14 DIAGNOSIS — I252 Old myocardial infarction: Secondary | ICD-10-CM

## 2015-03-14 DIAGNOSIS — I214 Non-ST elevation (NSTEMI) myocardial infarction: Principal | ICD-10-CM | POA: Diagnosis present

## 2015-03-14 DIAGNOSIS — M549 Dorsalgia, unspecified: Secondary | ICD-10-CM | POA: Diagnosis present

## 2015-03-14 DIAGNOSIS — G8929 Other chronic pain: Secondary | ICD-10-CM | POA: Diagnosis present

## 2015-03-14 DIAGNOSIS — Z885 Allergy status to narcotic agent status: Secondary | ICD-10-CM

## 2015-03-14 DIAGNOSIS — Z955 Presence of coronary angioplasty implant and graft: Secondary | ICD-10-CM

## 2015-03-14 DIAGNOSIS — Z23 Encounter for immunization: Secondary | ICD-10-CM

## 2015-03-14 LAB — CBC WITH DIFFERENTIAL/PLATELET
BASOS ABS: 0 10*3/uL (ref 0.0–0.1)
BASOS PCT: 0 %
Eosinophils Absolute: 0 10*3/uL (ref 0.0–0.7)
Eosinophils Relative: 0 %
HEMATOCRIT: 40.1 % (ref 39.0–52.0)
HEMOGLOBIN: 13.8 g/dL (ref 13.0–17.0)
LYMPHS PCT: 20 %
Lymphs Abs: 1.4 10*3/uL (ref 0.7–4.0)
MCH: 32.4 pg (ref 26.0–34.0)
MCHC: 34.4 g/dL (ref 30.0–36.0)
MCV: 94.1 fL (ref 78.0–100.0)
Monocytes Absolute: 0.3 10*3/uL (ref 0.1–1.0)
Monocytes Relative: 5 %
NEUTROS ABS: 5 10*3/uL (ref 1.7–7.7)
NEUTROS PCT: 75 %
Platelets: 185 10*3/uL (ref 150–400)
RBC: 4.26 MIL/uL (ref 4.22–5.81)
RDW: 14.1 % (ref 11.5–15.5)
WBC: 6.7 10*3/uL (ref 4.0–10.5)

## 2015-03-14 LAB — D-DIMER, QUANTITATIVE: D-Dimer, Quant: 3.9 ug/mL-FEU — ABNORMAL HIGH (ref 0.00–0.50)

## 2015-03-14 LAB — HEPATIC FUNCTION PANEL
ALT: 10 U/L — AB (ref 17–63)
AST: 27 U/L (ref 15–41)
Albumin: 4.2 g/dL (ref 3.5–5.0)
Alkaline Phosphatase: 46 U/L (ref 38–126)
BILIRUBIN DIRECT: 0.2 mg/dL (ref 0.1–0.5)
Indirect Bilirubin: 0.6 mg/dL (ref 0.3–0.9)
Total Bilirubin: 0.8 mg/dL (ref 0.3–1.2)
Total Protein: 7.7 g/dL (ref 6.5–8.1)

## 2015-03-14 LAB — BASIC METABOLIC PANEL
ANION GAP: 12 (ref 5–15)
BUN: 15 mg/dL (ref 6–20)
CALCIUM: 9.6 mg/dL (ref 8.9–10.3)
CHLORIDE: 102 mmol/L (ref 101–111)
CO2: 23 mmol/L (ref 22–32)
Creatinine, Ser: 0.87 mg/dL (ref 0.61–1.24)
GFR calc non Af Amer: >60 mL/min (ref >60)
Glucose, Bld: 105 mg/dL — ABNORMAL HIGH (ref 65–99)
POTASSIUM: 4 mmol/L (ref 3.5–5.1)
Sodium: 137 mmol/L (ref 135–145)

## 2015-03-14 LAB — ETHANOL

## 2015-03-14 LAB — TROPONIN I
TROPONIN I: 0.1 ng/mL — AB (ref <0.031)
TROPONIN I: 0.11 ng/mL — AB (ref <0.031)
Troponin I: 0.1 ng/mL — ABNORMAL HIGH (ref <0.031)

## 2015-03-14 LAB — RAPID URINE DRUG SCREEN, HOSP PERFORMED
AMPHETAMINES: NOT DETECTED
BENZODIAZEPINES: POSITIVE — AB
Barbiturates: NOT DETECTED
COCAINE: NOT DETECTED
OPIATES: NOT DETECTED
Tetrahydrocannabinol: NOT DETECTED

## 2015-03-14 LAB — PROTIME-INR
INR: 1.1 (ref 0.00–1.49)
Prothrombin Time: 14.4 seconds (ref 11.6–15.2)

## 2015-03-14 LAB — LIPASE, BLOOD: Lipase: 22 U/L (ref 11–51)

## 2015-03-14 MED ORDER — LORAZEPAM 2 MG/ML IJ SOLN
1.0000 mg | Freq: Once | INTRAMUSCULAR | Status: AC
  Administered 2015-03-14: 1 mg via INTRAVENOUS
  Filled 2015-03-14: qty 1

## 2015-03-14 MED ORDER — ALBUTEROL SULFATE (2.5 MG/3ML) 0.083% IN NEBU: 3.0000 mL | INHALATION_SOLUTION | Freq: Four times a day (QID) | RESPIRATORY_TRACT | Status: DC | PRN

## 2015-03-14 MED ORDER — NITROGLYCERIN 0.4 MG SL SUBL
0.4000 mg | SUBLINGUAL_TABLET | SUBLINGUAL | Status: DC | PRN
  Administered 2015-03-14 (×5): 0.4 mg via SUBLINGUAL
  Filled 2015-03-14 (×2): qty 1

## 2015-03-14 MED ORDER — PNEUMOCOCCAL VAC POLYVALENT 25 MCG/0.5ML IJ INJ
0.5000 mL | INJECTION | INTRAMUSCULAR | Status: AC
  Administered 2015-03-15: 0.5 mL via INTRAMUSCULAR
  Filled 2015-03-14: qty 0.5

## 2015-03-14 MED ORDER — PANTOPRAZOLE SODIUM 40 MG PO TBEC
40.0000 mg | DELAYED_RELEASE_TABLET | Freq: Two times a day (BID) | ORAL | Status: DC
  Administered 2015-03-14 – 2015-03-17 (×5): 40 mg via ORAL
  Filled 2015-03-14 (×5): qty 1

## 2015-03-14 MED ORDER — ACETAMINOPHEN 325 MG PO TABS
650.0000 mg | ORAL_TABLET | ORAL | Status: DC | PRN
  Administered 2015-03-14 – 2015-03-17 (×10): 650 mg via ORAL
  Filled 2015-03-14 (×10): qty 2

## 2015-03-14 MED ORDER — MORPHINE SULFATE (PF) 2 MG/ML IV SOLN
2.0000 mg | INTRAVENOUS | Status: DC | PRN
  Administered 2015-03-14 – 2015-03-15 (×8): 2 mg via INTRAVENOUS
  Filled 2015-03-14 (×8): qty 1

## 2015-03-14 MED ORDER — ACETAMINOPHEN 325 MG PO TABS: 650.0000 mg | ORAL_TABLET | Freq: Four times a day (QID) | ORAL | Status: DC | PRN

## 2015-03-14 MED ORDER — ASPIRIN EC 325 MG PO TBEC
325.0000 mg | DELAYED_RELEASE_TABLET | Freq: Every day | ORAL | Status: DC
  Administered 2015-03-15 – 2015-03-16 (×2): 325 mg via ORAL
  Filled 2015-03-14 (×2): qty 1

## 2015-03-14 MED ORDER — GI COCKTAIL ~~LOC~~
30.0000 mL | Freq: Four times a day (QID) | ORAL | Status: DC | PRN
  Administered 2015-03-15: 30 mL via ORAL
  Filled 2015-03-14: qty 30

## 2015-03-14 MED ORDER — METOPROLOL TARTRATE 25 MG PO TABS
25.0000 mg | ORAL_TABLET | Freq: Two times a day (BID) | ORAL | Status: DC
  Administered 2015-03-14 – 2015-03-17 (×6): 25 mg via ORAL
  Filled 2015-03-14 (×6): qty 1

## 2015-03-14 MED ORDER — NICOTINE 14 MG/24HR TD PT24
14.0000 mg | MEDICATED_PATCH | Freq: Every day | TRANSDERMAL | Status: DC
  Administered 2015-03-14 – 2015-03-16 (×3): 14 mg via TRANSDERMAL
  Filled 2015-03-14 (×3): qty 1

## 2015-03-14 MED ORDER — ASPIRIN 81 MG PO CHEW
324.0000 mg | CHEWABLE_TABLET | Freq: Once | ORAL | Status: AC
  Administered 2015-03-14: 324 mg via ORAL
  Filled 2015-03-14: qty 4

## 2015-03-14 MED ORDER — NITROGLYCERIN 0.4 MG SL SUBL
SUBLINGUAL_TABLET | SUBLINGUAL | Status: AC
  Administered 2015-03-14: 0.4 mg via SUBLINGUAL
  Filled 2015-03-14: qty 1

## 2015-03-14 MED ORDER — SERTRALINE HCL 50 MG PO TABS
50.0000 mg | ORAL_TABLET | Freq: Two times a day (BID) | ORAL | Status: DC
  Administered 2015-03-14 – 2015-03-17 (×5): 50 mg via ORAL
  Filled 2015-03-14: qty 2
  Filled 2015-03-14 (×4): qty 1

## 2015-03-14 MED ORDER — INFLUENZA VAC SPLIT QUAD 0.5 ML IM SUSY
0.5000 mL | PREFILLED_SYRINGE | INTRAMUSCULAR | Status: AC
  Administered 2015-03-15: 0.5 mL via INTRAMUSCULAR
  Filled 2015-03-14: qty 0.5

## 2015-03-14 MED ORDER — FENTANYL CITRATE (PF) 100 MCG/2ML IJ SOLN
50.0000 ug | Freq: Once | INTRAMUSCULAR | Status: AC
  Administered 2015-03-14: 50 ug via INTRAVENOUS
  Filled 2015-03-14: qty 2

## 2015-03-14 MED ORDER — FENTANYL CITRATE (PF) 100 MCG/2ML IJ SOLN
50.0000 ug | Freq: Once | INTRAMUSCULAR | Status: AC
Start: 2015-03-14 — End: 2015-03-14
  Administered 2015-03-14: 50 ug via INTRAVENOUS
  Filled 2015-03-14: qty 2

## 2015-03-14 MED ORDER — ONDANSETRON HCL 4 MG/2ML IJ SOLN
4.0000 mg | Freq: Four times a day (QID) | INTRAMUSCULAR | Status: DC | PRN
  Administered 2015-03-15 – 2015-03-17 (×2): 4 mg via INTRAVENOUS
  Filled 2015-03-14 (×2): qty 2

## 2015-03-14 MED ORDER — IOHEXOL 350 MG/ML SOLN
100.0000 mL | Freq: Once | INTRAVENOUS | Status: AC | PRN
  Administered 2015-03-14: 100 mL via INTRAVENOUS

## 2015-03-14 MED ORDER — SODIUM CHLORIDE 0.9 % IV SOLN
INTRAVENOUS | Status: DC
  Administered 2015-03-14: 11:00:00 via INTRAVENOUS

## 2015-03-14 MED ORDER — NITROGLYCERIN 0.4 MG SL SUBL: 0.4000 mg | SUBLINGUAL_TABLET | SUBLINGUAL | Status: DC | PRN

## 2015-03-14 MED ORDER — TRAZODONE HCL 50 MG PO TABS
50.0000 mg | ORAL_TABLET | Freq: Every day | ORAL | Status: DC
  Administered 2015-03-14 – 2015-03-15 (×2): 50 mg via ORAL
  Filled 2015-03-14 (×2): qty 1

## 2015-03-14 MED ORDER — SODIUM CHLORIDE 0.9 % IV BOLUS (SEPSIS)
250.0000 mL | Freq: Once | INTRAVENOUS | Status: AC
  Administered 2015-03-14: 250 mL via INTRAVENOUS

## 2015-03-14 MED ORDER — ISOSORBIDE MONONITRATE ER 60 MG PO TB24
60.0000 mg | ORAL_TABLET | Freq: Two times a day (BID) | ORAL | Status: DC
  Administered 2015-03-14 – 2015-03-17 (×6): 60 mg via ORAL
  Filled 2015-03-14 (×6): qty 1

## 2015-03-14 NOTE — ED Notes (Signed)
Pt reports chest pain and sob x 3 days.  Also reports had back surgery 10 years ago and had been on pain medication for the past 10 years.  Wife says pt failed his drug test so they took away his pain medication.  Reports has not had his pain medication for the past 4 weeks.  Pt says he took 1/2 xanax that isn't prescribed to him last night.

## 2015-03-14 NOTE — ED Provider Notes (Addendum)
CSN: [id number]     Arrival date & time 03/14/15  0900 History  By signing my name below, I, Stephania Fragmin, attest that this documentation has been prepared under the direction and in the presence of Fredia Sorrow, MD. Electronically Signed: Stephania Fragmin, ED Scribe. 03/14/2015. 10:52 AM.    Chief Complaint  Patient presents with  . Chest Pain   Patient is a 65 y.o. male presenting with chest pain. The history is provided by the patient, the spouse and medical records. No language interpreter was used.  Chest Pain Pain location:  Substernal area Pain quality: aching   Pain radiates to:  L arm and neck Pain radiates to the back: no   Pain severity:  Severe Duration:  2 days Timing:  Constant Chronicity:  New Relieved by:  None tried Worsened by:  Nothing tried Ineffective treatments:  None tried Associated symptoms: abdominal pain, back pain, cough, nausea and shortness of breath   Associated symptoms: no fever, no headache and not vomiting     HPI Comments: [name] is a 65 y.o. male with a history of type 3 aortic dissection, CAD, HTN, HLD, chronic back pain, and COPD, who presents to the Emergency Department complaining of substernal, constant, 8/10, aching, chest pain, radiating to his left arm and neck, that began 2 days ago. He notes associated nausea, SOB, blurred vision, cough, and left-sided abdominal pain. His wife also notes back pain. Patient's wife states patient is going through "withdrawals" from Fentanyl and oxycodone, which he last used 4 weeks ago. Per nursing notes, wife states patient failed his drug test, so he was taken off pain medications. His wife states he had back surgery 10 years ago and has been on pain medication for 10 years. Patient denies fevers, chills, rhinorrhea, sore throat, vomiting, diarrhea, dysuria, leg swelling, bleeding easily, or rash.   PCP: [name], MD   Past Medical History  Diagnosis Date  . Cardiac tamponade   . Aortic dissection  (HCC)     TYPE 3  . HTN (hypertension)   . CAD (coronary artery disease)   . Hypercholesterolemia   . Chronic back pain   . Hiatal hernia   . Schatzki's ring   . Chronic fatigue   . Chronic pain   . Peptic ulcer disease 08/2010    EGD   . COPD (chronic obstructive pulmonary disease) ([place])   . Chronic bronchitis [facility])    Past Surgical History  Procedure Laterality Date  . Sternal wire removeal  12/31/2007    HENDRICKSON  . Cabg x 6  07/31/2007    HENDRICKSON  . Cardiac catheterization    . Knee surgery Right     acl  . Shoulder surgery Left   . Orif scapular fracture Right   . Back surgery    . Olecranon bursectomy Left 08/27/2012    Procedure: EXCISION LEFT OLECRANON BURSA;  Surgeon: Sanjuana Kava, MD;  Location: [facility];  Service: Orthopedics;  Laterality: Left;  . Olecranon bursectomy Left 05/25/2013    Procedure:  Excision sinus tract and tissue left elbow ;  Surgeon: Sanjuana Kava, MD;  Location: [facility];  Service: Orthopedics;  Laterality: Left;   Family History  Problem Relation Age of Onset  . Heart disease Mother   . Heart disease Father    Social History  Substance Use Topics  . Smoking status: Current Some Day Smoker -- 0.50 packs/day for 50 years    Types: Cigarettes  . Smokeless tobacco: Never  Used  . Alcohol Use: No    Review of Systems  Constitutional: Negative for fever and chills.  HENT: Negative for rhinorrhea and sore throat.   Eyes: Positive for visual disturbance (blurred vision).  Respiratory: Positive for cough and shortness of breath.   Cardiovascular: Positive for chest pain. Negative for leg swelling.  Gastrointestinal: Positive for nausea and abdominal pain. Negative for vomiting and diarrhea.  Genitourinary: Negative for dysuria.  Musculoskeletal: Positive for back pain.  Skin: Negative for rash.  Neurological: Negative for headaches.  Hematological: Does not bruise/bleed easily.      Allergies  Codeine  Home Medications   Prior to  Admission medications   Medication Sig Start Date End Date Taking? Authorizing Provider  isosorbide mononitrate (IMDUR) 60 MG 24 hr tablet TAKE ONE TABLET BY MOUTH TWICE DAILY 05/16/14  Yes Jerline Pain, MD  metoprolol tartrate (LOPRESSOR) 25 MG tablet TAKE ONE TABLET BY MOUTH TWICE DAILY 02/27/15  Yes Jerline Pain, MD  nitroGLYCERIN (NITROSTAT) 0.4 MG SL tablet Place 0.4 mg under the tongue every 5 (five) minutes as needed. Chest pain   Yes Historical Provider, MD  pantoprazole (PROTONIX) 40 MG tablet Take 40 mg by mouth 2 (two) times daily.  08/08/10  Yes Historical Provider, MD  PROAIR HFA 108 (90 BASE) MCG/ACT inhaler Take 2 puffs by mouth every 6 (six) hours as needed for wheezing. Lung disease 03/10/14  Yes Historical Provider, MD  sertraline (ZOLOFT) 50 MG tablet Take 50 mg by mouth 2 (two) times daily.   Yes Historical Provider, MD  cyclobenzaprine (FLEXERIL) 10 MG tablet Take 1 tablet (10 mg total) by mouth 2 (two) times daily as needed for muscle spasms. Patient not taking: Reported on 03/14/2015 12/18/14   Samantha Tripp Dowless, PA-C  predniSONE (STERAPRED UNI-PAK 21 TAB) 10 MG (21) TBPK tablet Take 1 tablet (10 mg total) by mouth daily. Take 6 tabs by mouth daily  for 2 days, then 5 tabs for 2 days, then 4 tabs for 2 days, then 3 tabs for 2 days, 2 tabs for 2 days, then 1 tab by mouth daily for 2 days Patient not taking: Reported on 03/14/2015 12/18/14   Samantha Tripp Dowless, PA-C   BP 156/97 mmHg  Pulse 78  Temp(Src) 98.2 F (36.8 C) (Oral)  Resp 15  Ht 5\' 10"  (1.778 m)  Wt 63.504 kg  BMI 20.09 kg/m2  SpO2 100% Physical Exam  Constitutional: He is oriented to person, place, and time. He appears well-developed and well-nourished. No distress.  HENT:  Head: Normocephalic and atraumatic.  Mouth/Throat: Oropharynx is clear and moist.  Mucous membranes are moist.   Eyes: Conjunctivae and EOM are normal. Pupils are equal, round, and reactive to light. No scleral icterus.  Pupils are  normal. Sclera is clear. Eyes track normal.    Neck: Neck supple. No tracheal deviation present.  Cardiovascular: Normal rate, regular rhythm and normal heart sounds.   Pulmonary/Chest: Effort normal and breath sounds normal. No respiratory distress. He has no wheezes. He has no rales.  Lungs are clear to auscultation bilaterally. O2 Saturation at 100% while in the exam room.  Abdominal: Soft. Bowel sounds are normal. He exhibits no distension. There is tenderness. There is no guarding.  Abdomen flat. No guarding. Diffuse tenderness. Normal bowel sounds.  Musculoskeletal: Normal range of motion. He exhibits no edema.  No ankle swelling.  Neurological: He is alert and oriented to person, place, and time.  Skin: Skin is warm and dry.  Psychiatric: He has a normal mood and affect. His behavior is normal.  Nursing note and vitals reviewed.   ED Course  Procedures (including critical care time)  DIAGNOSTIC STUDIES: Oxygen Saturation is 100% on RA, normal by my interpretation.    COORDINATION OF CARE: 10:05 AM - Discussed treatment plan with pt and his wife at bedside which includes CT chest to r/o pneumonia. Pt and his wife verbalized understanding and agreed to plan.   Labs Review Labs Reviewed  BASIC METABOLIC PANEL - Abnormal; Notable for the following:    Glucose, Bld 105 (*)    All other components within normal limits  TROPONIN I - Abnormal; Notable for the following:    Troponin I 0.10 (*)    All other components within normal limits  URINE RAPID DRUG SCREEN, HOSP PERFORMED - Abnormal; Notable for the following:    Benzodiazepines POSITIVE (*)    All other components within normal limits  HEPATIC FUNCTION PANEL - Abnormal; Notable for the following:    ALT 10 (*)    All other components within normal limits  TROPONIN I - Abnormal; Notable for the following:    Troponin I 0.10 (*)    All other components within normal limits  CBC WITH DIFFERENTIAL/PLATELET  ETHANOL   PROTIME-INR  LIPASE, BLOOD   Results for orders placed or performed during the hospital encounter of 03/14/15  CBC with Differential  Result Value Ref Range   WBC 6.7 4.0 - 10.5 K/uL   RBC 4.26 4.22 - 5.81 MIL/uL   Hemoglobin 13.8 13.0 - 17.0 g/dL   HCT 40.1 39.0 - 52.0 %   MCV 94.1 78.0 - 100.0 fL   MCH 32.4 26.0 - 34.0 pg   MCHC 34.4 30.0 - 36.0 g/dL   RDW 14.1 11.5 - 15.5 %   Platelets 185 150 - 400 K/uL   Neutrophils Relative % 75 %   Neutro Abs 5.0 1.7 - 7.7 K/uL   Lymphocytes Relative 20 %   Lymphs Abs 1.4 0.7 - 4.0 K/uL   Monocytes Relative 5 %   Monocytes Absolute 0.3 0.1 - 1.0 K/uL   Eosinophils Relative 0 %   Eosinophils Absolute 0.0 0.0 - 0.7 K/uL   Basophils Relative 0 %   Basophils Absolute 0.0 0.0 - 0.1 K/uL  Basic metabolic panel  Result Value Ref Range   Sodium 137 135 - 145 mmol/L   Potassium 4.0 3.5 - 5.1 mmol/L   Chloride 102 101 - 111 mmol/L   CO2 23 22 - 32 mmol/L   Glucose, Bld 105 (H) 65 - 99 mg/dL   BUN 15 6 - 20 mg/dL   Creatinine, Ser 0.87 0.61 - 1.24 mg/dL   Calcium 9.6 8.9 - 10.3 mg/dL   GFR calc non Af Amer >60 >60 mL/min   GFR calc Af Amer >60 >60 mL/min   Anion gap 12 5 - 15  Troponin I  Result Value Ref Range   Troponin I 0.10 (H) <0.031 ng/mL  Ethanol  Result Value Ref Range   Alcohol, Ethyl (B) <5 <5 mg/dL  Urine rapid drug screen (hosp performed)  Result Value Ref Range   Opiates NONE DETECTED NONE DETECTED   Cocaine NONE DETECTED NONE DETECTED   Benzodiazepines POSITIVE (A) NONE DETECTED   Amphetamines NONE DETECTED NONE DETECTED   Tetrahydrocannabinol NONE DETECTED NONE DETECTED   Barbiturates NONE DETECTED NONE DETECTED  Protime-INR  Result Value Ref Range   Prothrombin Time 14.4 11.6 - 15.2 seconds  INR 1.10 0.00 - 1.49  Hepatic function panel  Result Value Ref Range   Total Protein 7.7 6.5 - 8.1 g/dL   Albumin 4.2 3.5 - 5.0 g/dL   AST 27 15 - 41 U/L   ALT 10 (L) 17 - 63 U/L   Alkaline Phosphatase 46 38 - 126 U/L    Total Bilirubin 0.8 0.3 - 1.2 mg/dL   Bilirubin, Direct 0.2 0.1 - 0.5 mg/dL   Indirect Bilirubin 0.6 0.3 - 0.9 mg/dL  Lipase, blood  Result Value Ref Range   Lipase 22 11 - 51 U/L  Troponin I  Result Value Ref Range   Troponin I 0.10 (H) <0.031 ng/mL     Imaging Review Ct Chest Wo Contrast  03/14/2015  CLINICAL DATA:  65 year old male with chest pain and shortness of breath for 3 days. Nausea. Smoker. Suggestion of acute opacity in the peripheral right lung on portable chest x-ray today. Initial encounter. EXAM: CT CHEST WITHOUT CONTRAST TECHNIQUE: Multidetector CT imaging of the chest was performed following the standard protocol without IV contrast. COMPARISON:  Portable chest radiograph 0932 hours. Chest CTA 12/17/2012. FINDINGS: Minimal non dependent retained secretions in the trachea. Otherwise the major airways are patent. Pulmonary hyperinflation. Mild scarring in the lingula and left lower lobe. No confluent opacity in the right lung to correspond to the earlier radiographic appearance. No pleural effusion. No pulmonary nodule identified. Chronic enlargement of the thoracic aorta, seen to be associated with type B dissection in 2014. Stable enlarged descending aorta caliber of 41 mm, while the distal arch caliber of 46 mm appears mildly increased since 2014 (44 mm at that time). Stable sequelae of CABG. No pericardial effusion. No mediastinal or hilar lymphadenopathy is evident. No axillary lymphadenopathy. Stable visualized upper abdominal viscera. The chronic dissection flap is partially calcified in the visualized upper abdominal. Aorta No acute osseous abnormality identified. IMPRESSION: 1. Chronic pulmonary hyperinflation with no acute pulmonary findings. No right lung opacity to correspond to the earlier radiographic appearance which must have been artifact. 2. Chronic aortic dissection. No evidence of rupture in the chest on this noncontrast study. The dissection was Stanford type B  in 2014. Abnormally enlarged thoracic aortic caliber is stable to slightly increased since that time. Electronically Signed   By: Genevie Ann M.D.   On: 03/14/2015 11:30   Dg Chest Portable 1 View  03/14/2015  CLINICAL DATA:  65 year old male with chest pain and shortness of breath for 3 days. Initial encounter. Smoker EXAM: PORTABLE CHEST 1 VIEW COMPARISON:  02/04/2014 and earlier. FINDINGS: Portable AP upright view at 0932 hours. Interval removal right PICC line. Multiple EKG leads overlie the chest. Sequelae of CABG. Tortuous thoracic aorta appears stable. Other mediastinal contours are within normal limits. No pneumothorax, pulmonary edema or pleural effusion. Lung bases appear stable. There is suggestion of new patchy opacity in the peripheral right upper lobe (arrow). No other acute pulmonary opacity. IMPRESSION: 1. Suggestion of small area of acute patchy opacity in the peripheral right upper lobe, nonspecific but consider acute infectious exacerbation. 2. No associated pleural effusion and otherwise stable chest. Electronically Signed   By: Genevie Ann M.D.   On: 03/14/2015 09:48   I have personally reviewed and evaluated these images and lab results as part of my medical decision-making.   EKG Interpretation   Date/Time:  Tuesday March 14 2015 09:14:17 EST Ventricular Rate:  71 PR Interval:  143 QRS Duration: 109 QT Interval:  398 QTC Calculation: 432 R Axis:  109 Text Interpretation:  Sinus rhythm Right axis deviation Abnormal T,  consider ischemia, diffuse leads Confirmed by Siennah Barrasso  MD, Jasenia Weilbacher ([id number])  on 03/14/2015 9:16:47 AM      Repeat EKG without any changes still has the inverted T waves suggestive of ischemia. No significant ST segment changes. No change from the first one.    MDM   Final diagnoses:  Chest pain, unspecified chest pain type  Chronic back pain   Patient repeat troponin is still at 0.1. No change over 3 hours. May very well be this is a baseline number for  patient would've expected to have changed over 3 hours. Patient still with some chest pain. CT of chest without any change in his old area of dissection. No acute chest findings no evidence of pneumonia. Due to his history of chest pain feel patient needs to be admitted will discuss with the hospitalist.  Patient was given aspirin and nitroglycerin with improvement in pain.  I personally performed the services described in this documentation, which was scribed in my presence. The recorded information has been reviewed and is accurate.       Fredia Sorrow, MD 03/14/15 [place], MD 03/14/15 1446

## 2015-03-14 NOTE — ED Notes (Signed)
Pt complain of chest pain 6/10

## 2015-03-14 NOTE — H&P (Signed)
History and Physical  [name] [id number] DOB: [birth year] DOA: 03/14/2015  Referring physician: Dr. Rogene Houston in ED PCP: [name], MD   Chief Complaint: chest pain  HPI:  65yom PMH coronary artery disease, COPD, aortic dissection presented to the emergency department with 3 days of constant chest pain with some associated shortness of breath. Initial evaluation revealed modest elevation of troponin without significant change 3 hours later, EKG with T-wave inversion laterally. Pain was improved in the emergency department he was referred for observation.  Patient reports he has felt poorly over the last month. His pain medication was discontinued by his neurosurgeon approximately 1 month ago and since that time he has felt very poorly. He has had worsening back pain, generalized weakness and difficulty ambulating.   For the last 3 days he has had constant pressure-like chest pain worsened by exertion and relieved by rest. He's had some shortness of breath with this. No jaw pain. No diaphoresis. He has had some pain in his arm as well.  According to the chart his wife is concerned that he is going through withdrawal from pain medication.  In the emergency department afebrile, VSS, no hypoxia. Treated with aspirin Pertinent labs: CMP, CBC WNL; troponin 0.1 >> 0.1. Alcohol negative. UDS positive for BZD EKG: Independently reviewed. 0914 NSR, lateral and inferior twave inversion; 1336 NSR, lateral twave inversion Imaging: CT chest noted, NAD. CXR NAD (artifact based on CT)  Review of Systems:  Negative for fever, visual changes, sore throat, rash, new muscle aches, dysuria, bleeding, n/v/abdominal pain.  Past Medical History  Diagnosis Date  . Cardiac tamponade   . Aortic dissection (HCC)     TYPE 3  . HTN (hypertension)   . CAD (coronary artery disease)   . Hypercholesterolemia   . Chronic back pain   . Hiatal hernia   . Schatzki's ring   . Chronic fatigue   . Chronic  pain   . Peptic ulcer disease 08/2010    EGD   . COPD (chronic obstructive pulmonary disease) ([place])   . Chronic bronchitis [facility])     Past Surgical History  Procedure Laterality Date  . Sternal wire removeal  12/31/2007    [facility]  . Cabg x 6  07/31/2007    [facility]  . Cardiac catheterization    . Knee surgery Right     acl  . Shoulder surgery Left   . Orif scapular fracture Right   . Back surgery    . Olecranon bursectomy Left 08/27/2012    Procedure: EXCISION LEFT OLECRANON BURSA;  Surgeon: Sanjuana Kava, MD;  Location: [facility];  Service: Orthopedics;  Laterality: Left;  . Olecranon bursectomy Left 05/25/2013    Procedure:  Excision sinus tract and tissue left elbow ;  Surgeon: Sanjuana Kava, MD;  Location: [facility];  Service: Orthopedics;  Laterality: Left;    Social History:  reports that he has been smoking Cigarettes.  He has a 25 pack-year smoking history. He has never used smokeless tobacco. He reports that he does not drink alcohol or use illicit drugs.   Allergies  Allergen Reactions  . Codeine Other (See Comments)    Causes GI Upset    Family History  Problem Relation Age of Onset  . Heart disease Mother   . Heart disease Father      Prior to Admission medications   Medication Sig Start Date End Date Taking? Authorizing Provider  isosorbide mononitrate (IMDUR) 60 MG 24 hr tablet TAKE ONE TABLET BY  MOUTH TWICE DAILY 05/16/14  Yes Jerline Pain, MD  metoprolol tartrate (LOPRESSOR) 25 MG tablet TAKE ONE TABLET BY MOUTH TWICE DAILY 02/27/15  Yes Jerline Pain, MD  nitroGLYCERIN (NITROSTAT) 0.4 MG SL tablet Place 0.4 mg under the tongue every 5 (five) minutes as needed. Chest pain   Yes Historical Provider, MD  pantoprazole (PROTONIX) 40 MG tablet Take 40 mg by mouth 2 (two) times daily.  08/08/10  Yes Historical Provider, MD  PROAIR HFA 108 (90 BASE) MCG/ACT inhaler Take 2 puffs by mouth every 6 (six) hours as needed for wheezing. Lung disease 03/10/14  Yes Historical  Provider, MD  sertraline (ZOLOFT) 50 MG tablet Take 50 mg by mouth 2 (two) times daily.   Yes Historical Provider, MD  cyclobenzaprine (FLEXERIL) 10 MG tablet Take 1 tablet (10 mg total) by mouth 2 (two) times daily as needed for muscle spasms. Patient not taking: Reported on 03/14/2015 12/18/14   Samantha Tripp Dowless, PA-C  predniSONE (STERAPRED UNI-PAK 21 TAB) 10 MG (21) TBPK tablet Take 1 tablet (10 mg total) by mouth daily. Take 6 tabs by mouth daily  for 2 days, then 5 tabs for 2 days, then 4 tabs for 2 days, then 3 tabs for 2 days, 2 tabs for 2 days, then 1 tab by mouth daily for 2 days Patient not taking: Reported on 03/14/2015 12/18/14   Carlos Levering, PA-C   Physical Exam: Filed Vitals:   03/14/15 1445 03/14/15 1447 03/14/15 1500 03/14/15 1532  BP: 156/98  128/85 141/94  Pulse: 71  75 70  Temp:  98.1 F (36.7 C)  97.7 F (36.5 C)  TempSrc:      Resp: 11  12 16   Height:    5\' 10"  (1.778 m)  Weight:    63.504 kg (140 lb)  SpO2: 99%  98% 99%     General:  Appears calm and mildly uncomfortable Eyes: PERRL, normal lids, irises   ENT: grossly normal hearing, lips & tongue Neck: Appears normal Cardiovascular: RRR, no m/r/g. No LE edema. Radial pulses 2+ bilaterally, equal. Respiratory: CTA bilaterally, no w/r/r. Normal respiratory effort. Abdomen: soft, ntnd Skin: no rash or induration noted Musculoskeletal: grossly normal tone BUE/BLE Psychiatric: grossly normal mood and affect, speech fluent and appropriate Neurologic: grossly non-focal.  Wt Readings from Last 3 Encounters:  03/14/15 63.504 kg (140 lb)  12/04/14 70.308 kg (155 lb)  12/04/14 70.308 kg (155 lb)    Labs on Admission:  Basic Metabolic Panel:  Recent Labs Lab 03/14/15 0923  NA 137  K 4.0  CL 102  CO2 23  GLUCOSE 105*  BUN 15  CREATININE 0.87  CALCIUM 9.6    Liver Function Tests:  Recent Labs Lab 03/14/15 0923  AST 27  ALT 10*  ALKPHOS 46  BILITOT 0.8  PROT 7.7  ALBUMIN 4.2     Recent Labs Lab 03/14/15 0923  LIPASE 22    CBC:  Recent Labs Lab 03/14/15 0923  WBC 6.7  NEUTROABS 5.0  HGB 13.8  HCT 40.1  MCV 94.1  PLT 185    Cardiac Enzymes:  Recent Labs Lab 03/14/15 0923 03/14/15 1230  TROPONINI 0.10* 0.10*     Radiological Exams on Admission: Ct Chest Wo Contrast  03/14/2015  CLINICAL DATA:  65 year old male with chest pain and shortness of breath for 3 days. Nausea. Smoker. Suggestion of acute opacity in the peripheral right lung on portable chest x-ray today. Initial encounter. EXAM: CT CHEST WITHOUT CONTRAST TECHNIQUE: Multidetector  CT imaging of the chest was performed following the standard protocol without IV contrast. COMPARISON:  Portable chest radiograph 0932 hours. Chest CTA 12/17/2012. FINDINGS: Minimal non dependent retained secretions in the trachea. Otherwise the major airways are patent. Pulmonary hyperinflation. Mild scarring in the lingula and left lower lobe. No confluent opacity in the right lung to correspond to the earlier radiographic appearance. No pleural effusion. No pulmonary nodule identified. Chronic enlargement of the thoracic aorta, seen to be associated with type B dissection in 2014. Stable enlarged descending aorta caliber of 41 mm, while the distal arch caliber of 46 mm appears mildly increased since 2014 (44 mm at that time). Stable sequelae of CABG. No pericardial effusion. No mediastinal or hilar lymphadenopathy is evident. No axillary lymphadenopathy. Stable visualized upper abdominal viscera. The chronic dissection flap is partially calcified in the visualized upper abdominal. Aorta No acute osseous abnormality identified. IMPRESSION: 1. Chronic pulmonary hyperinflation with no acute pulmonary findings. No right lung opacity to correspond to the earlier radiographic appearance which must have been artifact. 2. Chronic aortic dissection. No evidence of rupture in the chest on this noncontrast study. The dissection was  Stanford type B in 2014. Abnormally enlarged thoracic aortic caliber is stable to slightly increased since that time. Electronically Signed   By: Genevie Ann M.D.   On: 03/14/2015 11:30   Dg Chest Portable 1 View  03/14/2015  CLINICAL DATA:  65 year old male with chest pain and shortness of breath for 3 days. Initial encounter. Smoker EXAM: PORTABLE CHEST 1 VIEW COMPARISON:  02/04/2014 and earlier. FINDINGS: Portable AP upright view at 0932 hours. Interval removal right PICC line. Multiple EKG leads overlie the chest. Sequelae of CABG. Tortuous thoracic aorta appears stable. Other mediastinal contours are within normal limits. No pneumothorax, pulmonary edema or pleural effusion. Lung bases appear stable. There is suggestion of new patchy opacity in the peripheral right upper lobe (arrow). No other acute pulmonary opacity. IMPRESSION: 1. Suggestion of small area of acute patchy opacity in the peripheral right upper lobe, nonspecific but consider acute infectious exacerbation. 2. No associated pleural effusion and otherwise stable chest. Electronically Signed   By: Genevie Ann M.D.   On: 03/14/2015 09:48      Principal Problem:   Chest pain Active Problems:   Aortic dissection (HCC)   CAD (coronary artery disease)   COPD mixed type (HCC)   Tobacco abuse   Elevated troponin   Assessment/Plan 1. Chest pain, constant for 3 days, with some typical features. Constant chest pain for 3 days with minimal elevation of troponin argues against ACS. Multiple musculoskeletal complaints and COPD as well as cough argue for noncardiac etiology. 2. Mild troponin elevation. No elevation on repeat. 3. Abnormal EKG with new T-wave inversions inferolaterally compared to 2015 study. Follow clinically. 4. CAD, hyperlipidemia. According to cardiology 04/2014, the patient has multivessel disease, followed up on subsequent catheterization 02/2008 demonstrating occlusion of 2 of 4 grafts. Medical management recommended in  past. 5. COPD appears to be stable. 6. PMH chronic aortic dissection type B, PUD. Stable by CT chest. 7. Chronic back pain, exacerbated by lack of pain medication for the last month. 8. Significant weight loss over the last year. Suggest outpatient evaluation. 9. Tobacco use disorder   Plan observation, serial troponin, pain control, aspirin, nitroglycerin paste.  Repeat EKG in the morning  Cardiology consultation the morning or sooner troponin elevates or pain becomes worse.  Code Status: full code DVT prophylaxis: SCD Family Communication: none present Disposition Plan/Anticipated  LOS: obs, 24 hours  Time spent: 55 minutes  Murray Hodgkins, MD  [facility] Pager [phone] 03/14/2015, 6:35 PM

## 2015-03-15 ENCOUNTER — Observation Stay (HOSPITAL_COMMUNITY): Payer: Medicare PPO

## 2015-03-15 DIAGNOSIS — I7101 Dissection of thoracic aorta: Secondary | ICD-10-CM

## 2015-03-15 DIAGNOSIS — G8929 Other chronic pain: Secondary | ICD-10-CM | POA: Diagnosis not present

## 2015-03-15 DIAGNOSIS — I214 Non-ST elevation (NSTEMI) myocardial infarction: Principal | ICD-10-CM

## 2015-03-15 DIAGNOSIS — E785 Hyperlipidemia, unspecified: Secondary | ICD-10-CM | POA: Diagnosis not present

## 2015-03-15 DIAGNOSIS — Z79899 Other long term (current) drug therapy: Secondary | ICD-10-CM | POA: Diagnosis not present

## 2015-03-15 DIAGNOSIS — I25810 Atherosclerosis of coronary artery bypass graft(s) without angina pectoris: Secondary | ICD-10-CM | POA: Diagnosis present

## 2015-03-15 DIAGNOSIS — Z885 Allergy status to narcotic agent status: Secondary | ICD-10-CM | POA: Diagnosis not present

## 2015-03-15 DIAGNOSIS — Z8711 Personal history of peptic ulcer disease: Secondary | ICD-10-CM | POA: Diagnosis not present

## 2015-03-15 DIAGNOSIS — Z720 Tobacco use: Secondary | ICD-10-CM | POA: Diagnosis not present

## 2015-03-15 DIAGNOSIS — Z951 Presence of aortocoronary bypass graft: Secondary | ICD-10-CM | POA: Diagnosis not present

## 2015-03-15 DIAGNOSIS — K219 Gastro-esophageal reflux disease without esophagitis: Secondary | ICD-10-CM

## 2015-03-15 DIAGNOSIS — F17210 Nicotine dependence, cigarettes, uncomplicated: Secondary | ICD-10-CM | POA: Diagnosis present

## 2015-03-15 DIAGNOSIS — R7989 Other specified abnormal findings of blood chemistry: Secondary | ICD-10-CM

## 2015-03-15 DIAGNOSIS — M549 Dorsalgia, unspecified: Secondary | ICD-10-CM

## 2015-03-15 DIAGNOSIS — R079 Chest pain, unspecified: Secondary | ICD-10-CM

## 2015-03-15 DIAGNOSIS — I10 Essential (primary) hypertension: Secondary | ICD-10-CM

## 2015-03-15 DIAGNOSIS — I209 Angina pectoris, unspecified: Secondary | ICD-10-CM

## 2015-03-15 DIAGNOSIS — I25768 Atherosclerosis of bypass graft of coronary artery of transplanted heart with other forms of angina pectoris: Secondary | ICD-10-CM | POA: Diagnosis not present

## 2015-03-15 DIAGNOSIS — I7100 Dissection of unspecified site of aorta: Secondary | ICD-10-CM | POA: Diagnosis not present

## 2015-03-15 DIAGNOSIS — G894 Chronic pain syndrome: Secondary | ICD-10-CM

## 2015-03-15 DIAGNOSIS — J449 Chronic obstructive pulmonary disease, unspecified: Secondary | ICD-10-CM | POA: Diagnosis present

## 2015-03-15 DIAGNOSIS — R634 Abnormal weight loss: Secondary | ICD-10-CM | POA: Diagnosis not present

## 2015-03-15 DIAGNOSIS — I25110 Atherosclerotic heart disease of native coronary artery with unstable angina pectoris: Secondary | ICD-10-CM | POA: Diagnosis present

## 2015-03-15 DIAGNOSIS — I2510 Atherosclerotic heart disease of native coronary artery without angina pectoris: Secondary | ICD-10-CM | POA: Diagnosis not present

## 2015-03-15 DIAGNOSIS — I252 Old myocardial infarction: Secondary | ICD-10-CM | POA: Diagnosis not present

## 2015-03-15 DIAGNOSIS — Z23 Encounter for immunization: Secondary | ICD-10-CM | POA: Diagnosis not present

## 2015-03-15 LAB — LIPID PANEL
CHOL/HDL RATIO: 7 ratio
Cholesterol: 204 mg/dL — ABNORMAL HIGH (ref 0–200)
HDL: 29 mg/dL — ABNORMAL LOW (ref >40)
LDL Cholesterol: 149 mg/dL — ABNORMAL HIGH (ref 0–99)
Triglycerides: 130 mg/dL (ref <150)
VLDL: 26 mg/dL (ref 0–40)

## 2015-03-15 LAB — TROPONIN I
TROPONIN I: 0.1 ng/mL — AB (ref <0.031)
Troponin I: 0.08 ng/mL — ABNORMAL HIGH (ref <0.031)

## 2015-03-15 LAB — HEPARIN LEVEL (UNFRACTIONATED)

## 2015-03-15 MED ORDER — SODIUM CHLORIDE 0.9 % WEIGHT BASED INFUSION
1.0000 mL/kg/h | INTRAVENOUS | Status: DC
  Administered 2015-03-16: 1 mL/kg/h via INTRAVENOUS

## 2015-03-15 MED ORDER — SODIUM CHLORIDE 0.9 % IV SOLN: 250.0000 mL | INTRAVENOUS | Status: DC | PRN

## 2015-03-15 MED ORDER — SODIUM CHLORIDE 0.9 % WEIGHT BASED INFUSION
3.0000 mL/kg/h | INTRAVENOUS | Status: AC
  Administered 2015-03-16: 3 mL/kg/h via INTRAVENOUS

## 2015-03-15 MED ORDER — HEPARIN BOLUS VIA INFUSION
2000.0000 [IU] | Freq: Once | INTRAVENOUS | Status: AC
  Administered 2015-03-15: 2000 [IU] via INTRAVENOUS
  Filled 2015-03-15: qty 2000

## 2015-03-15 MED ORDER — FENTANYL CITRATE (PF) 100 MCG/2ML IJ SOLN
25.0000 ug | Freq: Once | INTRAMUSCULAR | Status: AC
  Administered 2015-03-15: 25 ug via INTRAVENOUS
  Filled 2015-03-15: qty 2

## 2015-03-15 MED ORDER — SODIUM CHLORIDE 0.9% FLUSH: 3.0000 mL | INTRAVENOUS | Status: DC | PRN

## 2015-03-15 MED ORDER — HEPARIN (PORCINE) IN NACL 100-0.45 UNIT/ML-% IJ SOLN
950.0000 [IU]/h | INTRAMUSCULAR | Status: DC
  Administered 2015-03-15: 750 [IU]/h via INTRAVENOUS
  Administered 2015-03-16: 950 [IU]/h via INTRAVENOUS
  Filled 2015-03-15 (×2): qty 250

## 2015-03-15 MED ORDER — ASPIRIN 81 MG PO CHEW
81.0000 mg | CHEWABLE_TABLET | ORAL | Status: AC
  Administered 2015-03-16: 81 mg via ORAL
  Filled 2015-03-15: qty 1

## 2015-03-15 MED ORDER — AMLODIPINE BESYLATE 5 MG PO TABS
5.0000 mg | ORAL_TABLET | Freq: Every day | ORAL | Status: DC
  Administered 2015-03-15 – 2015-03-17 (×3): 5 mg via ORAL
  Filled 2015-03-15 (×3): qty 1

## 2015-03-15 MED ORDER — FENTANYL CITRATE (PF) 100 MCG/2ML IJ SOLN
50.0000 ug | Freq: Once | INTRAMUSCULAR | Status: AC
  Administered 2015-03-15: 50 ug via INTRAVENOUS
  Filled 2015-03-15: qty 2

## 2015-03-15 MED ORDER — HEPARIN BOLUS VIA INFUSION
4000.0000 [IU] | Freq: Once | INTRAVENOUS | Status: AC
  Administered 2015-03-15: 4000 [IU] via INTRAVENOUS
  Filled 2015-03-15: qty 4000

## 2015-03-15 MED ORDER — SODIUM CHLORIDE 0.9% FLUSH
3.0000 mL | Freq: Two times a day (BID) | INTRAVENOUS | Status: DC
  Administered 2015-03-15: 3 mL via INTRAVENOUS

## 2015-03-15 NOTE — Progress Notes (Addendum)
ANTICOAGULATION CONSULT NOTE - Initial Consult  Pharmacy Consult for Heparin Indication: chest pain/ACS  Allergies  Allergen Reactions  . Codeine Other (See Comments)    Causes GI Upset   Patient Measurements: Height: 5\' 10"  (177.8 cm) Weight: 140 lb (63.504 kg) IBW/kg (Calculated) : 73 HEPARIN DW (KG): 63.5  Vital Signs: Temp: 98.8 F (37.1 C) (02/22 0500) Temp Source: Oral (02/22 0500) BP: 116/78 mmHg (02/22 0906) Pulse Rate: 60 (02/22 0906)  Labs:  Recent Labs  03/14/15 0923  03/14/15 1938 03/15/15 0121 03/15/15 0559  HGB 13.8  --   --   --   --   HCT 40.1  --   --   --   --   PLT 185  --   --   --   --   LABPROT 14.4  --   --   --   --   INR 1.10  --   --   --   --   CREATININE 0.87  --   --   --   --   TROPONINI 0.10*  < > 0.11* 0.10* 0.08*  < > = values in this interval not displayed.  Estimated Creatinine Clearance: 78.1 mL/min (by C-G formula based on Cr of 0.87).  Medical History: Past Medical History  Diagnosis Date  . Cardiac tamponade   . Aortic dissection (HCC)     TYPE 3  . HTN (hypertension)   . CAD (coronary artery disease)   . Hypercholesterolemia   . Chronic back pain   . Hiatal hernia   . Schatzki's ring   . Chronic fatigue   . Chronic pain   . Peptic ulcer disease 08/2010    EGD   . COPD (chronic obstructive pulmonary disease) ([place])   . Chronic bronchitis (HCC)     Medications:  Prescriptions prior to admission  Medication Sig Dispense Refill Last Dose  . isosorbide mononitrate (IMDUR) 60 MG 24 hr tablet TAKE ONE TABLET BY MOUTH TWICE DAILY 60 tablet 5 03/14/2015 at Unknown time  . metoprolol tartrate (LOPRESSOR) 25 MG tablet TAKE ONE TABLET BY MOUTH TWICE DAILY 180 tablet 3 03/14/2015 at 0430  . nitroGLYCERIN (NITROSTAT) 0.4 MG SL tablet Place 0.4 mg under the tongue every 5 (five) minutes as needed. Chest pain   unknown  . pantoprazole (PROTONIX) 40 MG tablet Take 40 mg by mouth 2 (two) times daily.    03/14/2015 at Unknown time   . PROAIR HFA 108 (90 BASE) MCG/ACT inhaler Take 2 puffs by mouth every 6 (six) hours as needed for wheezing. Lung disease   03/14/2015 at Unknown time  . sertraline (ZOLOFT) 50 MG tablet Take 50 mg by mouth 2 (two) times daily.   03/14/2015 at Unknown time  . cyclobenzaprine (FLEXERIL) 10 MG tablet Take 1 tablet (10 mg total) by mouth 2 (two) times daily as needed for muscle spasms. (Patient not taking: Reported on 03/14/2015) 20 tablet 0 Completed Course at Unknown time  . predniSONE (STERAPRED UNI-PAK 21 TAB) 10 MG (21) TBPK tablet Take 1 tablet (10 mg total) by mouth daily. Take 6 tabs by mouth daily  for 2 days, then 5 tabs for 2 days, then 4 tabs for 2 days, then 3 tabs for 2 days, 2 tabs for 2 days, then 1 tab by mouth daily for 2 days (Patient not taking: Reported on 03/14/2015) 42 tablet 0 Completed Course at Unknown time   Assessment: This is a 65 year old male patient with history of CAD status  post NSTEMI in 07/2007 treated with emergency CABG. 7 months later he had recurrent chest pain and cardiac catheterization in 2010 revealed severe three-vessel CAD EF 60% mild MR chronic thoracic aortic aneurysm dissection, occluded SVG to the RCA and occluded SVG to the OM, patent SVG to the diagonal and patent free LIMA to the ramus. The disease was too extensive and significant for PCI and medical therapy was recommended. He's had chronic type III aortic dissection followed by Dr. Roxan Hockey, hypertension, COPD and chronic pain.  Patient presents to the emergency room with 3 day history of chest pressure that awakened him from sleep, radiating to left neck associated with shortness of breath worsened by exertion and eased some with NTG and rest. Pain has been pretty constant. Prior to 3 days ago he wasn't having chest pain. 1 month ago he was denied fentanyl and oxycodone that he takes for chronic back pain because he failed a drug test. He's been in withdrawal and having constant back pain which he says  makes his chest pain worse. He hasn't been able to walk without getting short of breath. Before that he could walk to mailbox without difficulty. Continues to smoke 1/2 ppd and wants to leave the floor now despite having current chest pain. Says Morphine doesn't help and has terrible headache from NTG. EKG shows new T-wave inversion inferior laterally and ST elevation in aVR and V1. Troponins have been flat at 0.01, 0.01, 0.11, 0.08. D-dimer was elevated at 3.9 and CT scan showed no evidence of acute PE and chronic type B dissection of the thoracic aorta without opacification of the false lumen although this may be related to early bolus timing for pulmonary artery study.  Goal of Therapy:  Heparin level 0.3-0.7 units/ml Monitor platelets by anticoagulation protocol: Yes   Plan:  Heparin 4000 units IV bolus now x 1 Heparin infusion at 750 units/hr Heparin level in 6-8 hours then daily CBC daily while on Heparin  Hall, Scott A 03/15/2015,9:55 AM  Addum:  Initial heparin level <0.1 units/ml.  No issues noted with iinfusion.  Will rebolus with 2000 units and increase drip to 950 units/hr.  F/u am levels Excell Seltzer, PharmD

## 2015-03-15 NOTE — Care Management Obs Status (Signed)
[facility] NOTIFICATION   Patient Details  Name: [name] MRN: [id number] Date of Birth: 1950/10/26   Medicare Observation Status Notification Given:  Yes    Alvie Heidelberg, RN 03/15/2015, 11:32 AM

## 2015-03-15 NOTE — Consult Note (Addendum)
Reason for Consult chest pain Referring Physician: [facility] Cardiologist [name] Consulting cardiologist: Dr. Adella Nissen is an 65 y.o. male.  HPI: This is a 65 year old male patient with history of CAD status post NSTEMI in 07/2007 treated with emergency CABG. 7 months later he had recurrent chest pain and cardiac catheterization in 2010 revealed severe three-vessel CAD EF 60% mild MR chronic thoracic aortic aneurysm dissection, occluded SVG to the RCA and occluded SVG to the OM, patent SVG to the diagonal and patent free LIMA to the ramus. The  disease was too extensive and significant for PCI and medical therapy was recommended. He's had chronic type III aortic dissection followed by Dr. Roxan Hockey, hypertension, COPD and chronic pain.  Patient presents to the emergency room with 3 day history of  chest pressure that awakened him from sleep, radiating to left neck associated with shortness of breath worsened by exertion and eased some with NTG and rest. Pain has been pretty constant. Prior to 3 days ago he wasn't having chest pain. 1 month ago he was denied fentanyl and oxycodone that he takes for chronic back pain because he failed a drug test. He's been in withdrawal and having constant back pain which he says makes his chest pain worse. He hasn't been able to walk without getting short of breath. Before that he could walk to mailbox without difficulty. Continues to smoke 1/2 ppd and wants to leave the floor now despite having current chest pain. Says Morphine doesn't help and has terrible headache from NTG. EKG shows new T-wave inversion inferior laterally and ST elevation in aVR and V1. Troponins have been flat at 0.01, 0.01, 0.11, 0.08. D-dimer was elevated at 3.9 and CT scan showed no evidence of acute PE and chronic type B dissection of the thoracic aorta without opacification of the false lumen although this may be related to early bolus timing for pulmonary artery study.  Past  Medical History  Diagnosis Date  . Cardiac tamponade   . Aortic dissection (HCC)     TYPE 3  . HTN (hypertension)   . CAD (coronary artery disease)   . Hypercholesterolemia   . Chronic back pain   . Hiatal hernia   . Schatzki's ring   . Chronic fatigue   . Chronic pain   . Peptic ulcer disease 08/2010    EGD   . COPD (chronic obstructive pulmonary disease) ([place])   . Chronic bronchitis [facility])     Past Surgical History  Procedure Laterality Date  . Sternal wire removeal  12/31/2007    [facility]  . Cabg x 6  07/31/2007    [facility]DR[facility]SON  . Cardiac catheterization    . Knee surgery Right     acl  . Shoulder surgery Left   . Orif scapular fracture Right   . Back surgery    . Olecranon bursectomy Left 08/27/2012    Procedure: EXCISION LEFT OLECRANON BURSA;  Surgeon: Sanjuana Kava, MD;  Location: [facility];  Service: Orthopedics;  Laterality: Left;  . Olecranon bursectomy Left 05/25/2013    Procedure:  Excision sinus tract and tissue left elbow ;  Surgeon: Sanjuana Kava, MD;  Location: [facility];  Service: Orthopedics;  Laterality: Left;    Family History  Problem Relation Age of Onset  . Heart disease Mother   . Heart disease Father     Social History:  reports that he has been smoking Cigarettes.  He has a 25 pack-year smoking history. He has never  used smokeless tobacco. He reports that he does not drink alcohol or use illicit drugs.  Allergies:  Allergies  Allergen Reactions  . Codeine Other (See Comments)    Causes GI Upset    Medications:  Scheduled Meds: . aspirin EC  325 mg Oral Daily  . Influenza vac split quadrivalent PF  0.5 mL Intramuscular Tomorrow-1000  . isosorbide mononitrate  60 mg Oral BID  . metoprolol tartrate  25 mg Oral BID  . nicotine  14 mg Transdermal Daily  . pantoprazole  40 mg Oral BID  . pneumococcal 23 valent vaccine  0.5 mL Intramuscular Tomorrow-1000  . sertraline  50 mg Oral BID  . traZODone  50 mg Oral QHS   Continuous Infusions:  PRN  Meds:.acetaminophen, albuterol, gi cocktail, morphine injection, nitroGLYCERIN, ondansetron (ZOFRAN) IV  Results for orders placed or performed during the hospital encounter of 03/14/15 (from the past 48 hour(s))  CBC with Differential     Status: None   Collection Time: 03/14/15  9:23 AM  Result Value Ref Range   WBC 6.7 4.0 - 10.5 K/uL   RBC 4.26 4.22 - 5.81 MIL/uL   Hemoglobin 13.8 13.0 - 17.0 g/dL   HCT 40.1 39.0 - 52.0 %   MCV 94.1 78.0 - 100.0 fL   MCH 32.4 26.0 - 34.0 pg   MCHC 34.4 30.0 - 36.0 g/dL   RDW 14.1 11.5 - 15.5 %   Platelets 185 150 - 400 K/uL   Neutrophils Relative % 75 %   Neutro Abs 5.0 1.7 - 7.7 K/uL   Lymphocytes Relative 20 %   Lymphs Abs 1.4 0.7 - 4.0 K/uL   Monocytes Relative 5 %   Monocytes Absolute 0.3 0.1 - 1.0 K/uL   Eosinophils Relative 0 %   Eosinophils Absolute 0.0 0.0 - 0.7 K/uL   Basophils Relative 0 %   Basophils Absolute 0.0 0.0 - 0.1 K/uL  Basic metabolic panel     Status: Abnormal   Collection Time: 03/14/15  9:23 AM  Result Value Ref Range   Sodium 137 135 - 145 mmol/L   Potassium 4.0 3.5 - 5.1 mmol/L   Chloride 102 101 - 111 mmol/L   CO2 23 22 - 32 mmol/L   Glucose, Bld 105 (H) 65 - 99 mg/dL   BUN 15 6 - 20 mg/dL   Creatinine, Ser 0.87 0.61 - 1.24 mg/dL   Calcium 9.6 8.9 - 10.3 mg/dL   GFR calc non Af Amer >60 >60 mL/min   GFR calc Af Amer >60 >60 mL/min    Comment: (NOTE) The eGFR has been calculated using the CKD EPI equation. This calculation has not been validated in all clinical situations. eGFR's persistently <60 mL/min signify possible Chronic Kidney Disease.    Anion gap 12 5 - 15  Troponin I     Status: Abnormal   Collection Time: 03/14/15  9:23 AM  Result Value Ref Range   Troponin I 0.10 (H) <0.031 ng/mL    Comment:        PERSISTENTLY INCREASED TROPONIN VALUES IN THE RANGE OF 0.04-0.49 ng/mL CAN BE SEEN IN:       -UNSTABLE ANGINA       -CONGESTIVE HEART FAILURE       -MYOCARDITIS       -CHEST TRAUMA        -ARRYHTHMIAS       -LATE PRESENTING MYOCARDIAL INFARCTION       -COPD   CLINICAL FOLLOW-UP RECOMMENDED.   Ethanol  Status: None   Collection Time: 03/14/15  9:23 AM  Result Value Ref Range   Alcohol, Ethyl (B) <5 <5 mg/dL    Comment:        LOWEST DETECTABLE LIMIT FOR SERUM ALCOHOL IS 5 mg/dL FOR MEDICAL PURPOSES ONLY   Protime-INR     Status: None   Collection Time: 03/14/15  9:23 AM  Result Value Ref Range   Prothrombin Time 14.4 11.6 - 15.2 seconds   INR 1.10 0.00 - 1.49  Hepatic function panel     Status: Abnormal   Collection Time: 03/14/15  9:23 AM  Result Value Ref Range   Total Protein 7.7 6.5 - 8.1 g/dL   Albumin 4.2 3.5 - 5.0 g/dL   AST 27 15 - 41 U/L   ALT 10 (L) 17 - 63 U/L   Alkaline Phosphatase 46 38 - 126 U/L   Total Bilirubin 0.8 0.3 - 1.2 mg/dL   Bilirubin, Direct 0.2 0.1 - 0.5 mg/dL   Indirect Bilirubin 0.6 0.3 - 0.9 mg/dL  Lipase, blood     Status: None   Collection Time: 03/14/15  9:23 AM  Result Value Ref Range   Lipase 22 11 - 51 U/L  Urine rapid drug screen (hosp performed)     Status: Abnormal   Collection Time: 03/14/15 11:35 AM  Result Value Ref Range   Opiates NONE DETECTED NONE DETECTED   Cocaine NONE DETECTED NONE DETECTED   Benzodiazepines POSITIVE (A) NONE DETECTED   Amphetamines NONE DETECTED NONE DETECTED   Tetrahydrocannabinol NONE DETECTED NONE DETECTED   Barbiturates NONE DETECTED NONE DETECTED    Comment:        DRUG SCREEN FOR MEDICAL PURPOSES ONLY.  IF CONFIRMATION IS NEEDED FOR ANY PURPOSE, NOTIFY LAB WITHIN 5 DAYS.        LOWEST DETECTABLE LIMITS FOR URINE DRUG SCREEN Drug Class       Cutoff (ng/mL) Amphetamine      1000 Barbiturate      200 Benzodiazepine   161 Tricyclics       096 Opiates          300 Cocaine          300 THC              50   Troponin I     Status: Abnormal   Collection Time: 03/14/15 12:30 PM  Result Value Ref Range   Troponin I 0.10 (H) <0.031 ng/mL    Comment:        PERSISTENTLY  INCREASED TROPONIN VALUES IN THE RANGE OF 0.04-0.49 ng/mL CAN BE SEEN IN:       -UNSTABLE ANGINA       -CONGESTIVE HEART FAILURE       -MYOCARDITIS       -CHEST TRAUMA       -ARRYHTHMIAS       -LATE PRESENTING MYOCARDIAL INFARCTION       -COPD   CLINICAL FOLLOW-UP RECOMMENDED.   Troponin I (q 6hr x 3)     Status: Abnormal   Collection Time: 03/14/15  7:38 PM  Result Value Ref Range   Troponin I 0.11 (H) <0.031 ng/mL    Comment:        PERSISTENTLY INCREASED TROPONIN VALUES IN THE RANGE OF 0.04-0.49 ng/mL CAN BE SEEN IN:       -UNSTABLE ANGINA       -CONGESTIVE HEART FAILURE       -MYOCARDITIS       -CHEST  TRAUMA       -ARRYHTHMIAS       -LATE PRESENTING MYOCARDIAL INFARCTION       -COPD   CLINICAL FOLLOW-UP RECOMMENDED.   D-dimer, quantitative (not at [facility])     Status: Abnormal   Collection Time: 03/14/15  7:38 PM  Result Value Ref Range   D-Dimer, Quant 3.90 (H) 0.00 - 0.50 ug/mL-FEU    Comment: (NOTE) At the manufacturer cut-off of 0.50 ug/mL FEU, this assay has been documented to exclude PE with a sensitivity and negative predictive value of 97 to 99%.  At this time, this assay has not been approved by the FDA to exclude DVT/VTE. Results should be correlated with clinical presentation.   Troponin I (q 6hr x 3)     Status: Abnormal   Collection Time: 03/15/15  1:21 AM  Result Value Ref Range   Troponin I 0.10 (H) <0.031 ng/mL    Comment:        PERSISTENTLY INCREASED TROPONIN VALUES IN THE RANGE OF 0.04-0.49 ng/mL CAN BE SEEN IN:       -UNSTABLE ANGINA       -CONGESTIVE HEART FAILURE       -MYOCARDITIS       -CHEST TRAUMA       -ARRYHTHMIAS       -LATE PRESENTING MYOCARDIAL INFARCTION       -COPD   CLINICAL FOLLOW-UP RECOMMENDED.   Lipid panel     Status: Abnormal   Collection Time: 03/15/15  5:58 AM  Result Value Ref Range   Cholesterol 204 (H) 0 - 200 mg/dL   Triglycerides 130 <150 mg/dL   HDL 29 (L) >40 mg/dL   Total CHOL/HDL Ratio 7.0 RATIO   VLDL  26 0 - 40 mg/dL   LDL Cholesterol 149 (H) 0 - 99 mg/dL    Comment:        Total Cholesterol/HDL:CHD Risk Coronary Heart Disease Risk Table                     Men   Women  1/2 Average Risk   3.4   3.3  Average Risk       5.0   4.4  2 X Average Risk   9.6   7.1  3 X Average Risk  23.4   11.0        Use the calculated Patient Ratio above and the CHD Risk Table to determine the patient's CHD Risk.        ATP III CLASSIFICATION (LDL):  <100     mg/dL   Optimal  100-129  mg/dL   Near or Above                    Optimal  130-159  mg/dL   Borderline  160-189  mg/dL   High  >190     mg/dL   Very High   Troponin I (q 6hr x 3)     Status: Abnormal   Collection Time: 03/15/15  5:59 AM  Result Value Ref Range   Troponin I 0.08 (H) <0.031 ng/mL    Comment:        PERSISTENTLY INCREASED TROPONIN VALUES IN THE RANGE OF 0.04-0.49 ng/mL CAN BE SEEN IN:       -UNSTABLE ANGINA       -CONGESTIVE HEART FAILURE       -MYOCARDITIS       -CHEST TRAUMA       -ARRYHTHMIAS       -  LATE PRESENTING MYOCARDIAL INFARCTION       -COPD   CLINICAL FOLLOW-UP RECOMMENDED.     Ct Chest Wo Contrast  03/14/2015  CLINICAL DATA:  65 year old male with chest pain and shortness of breath for 3 days. Nausea. Smoker. Suggestion of acute opacity in the peripheral right lung on portable chest x-ray today. Initial encounter. EXAM: CT CHEST WITHOUT CONTRAST TECHNIQUE: Multidetector CT imaging of the chest was performed following the standard protocol without IV contrast. COMPARISON:  Portable chest radiograph 0932 hours. Chest CTA 12/17/2012. FINDINGS: Minimal non dependent retained secretions in the trachea. Otherwise the major airways are patent. Pulmonary hyperinflation. Mild scarring in the lingula and left lower lobe. No confluent opacity in the right lung to correspond to the earlier radiographic appearance. No pleural effusion. No pulmonary nodule identified. Chronic enlargement of the thoracic aorta, seen to be  associated with type B dissection in 2014. Stable enlarged descending aorta caliber of 41 mm, while the distal arch caliber of 46 mm appears mildly increased since 2014 (44 mm at that time). Stable sequelae of CABG. No pericardial effusion. No mediastinal or hilar lymphadenopathy is evident. No axillary lymphadenopathy. Stable visualized upper abdominal viscera. The chronic dissection flap is partially calcified in the visualized upper abdominal. Aorta No acute osseous abnormality identified. IMPRESSION: 1. Chronic pulmonary hyperinflation with no acute pulmonary findings. No right lung opacity to correspond to the earlier radiographic appearance which must have been artifact. 2. Chronic aortic dissection. No evidence of rupture in the chest on this noncontrast study. The dissection was Stanford type B in 2014. Abnormally enlarged thoracic aortic caliber is stable to slightly increased since that time. Electronically Signed   By: Genevie Ann M.D.   On: 03/14/2015 11:30   Ct Angio Chest Pe W/cm &/or Wo Cm  03/14/2015  CLINICAL DATA:  Chest pain with elevated D-dimer. EXAM: CT ANGIOGRAPHY CHEST WITH CONTRAST TECHNIQUE: Multidetector CT imaging of the chest was performed using the standard protocol during bolus administration of intravenous contrast. Multiplanar CT image reconstructions and MIPs were obtained to evaluate the vascular anatomy. CONTRAST:  131m OMNIPAQUE IOHEXOL 350 MG/ML SOLN COMPARISON:  Chest CT without contrast performed earlier the same day. 12/17/2012 FINDINGS: Mediastinum / Lymph Nodes: There is no axillary lymphadenopathy. No mediastinal lymphadenopathy. There is no hilar lymphadenopathy. The heart size is normal. No pericardial effusion. Patient is status post CABG. No filling defects within the opacified pulmonary arteries to suggest the presence of an acute pulmonary embolus. As seen on the study from 2014, patient has a tight be dissection of the thoracic aorta. In 2014, the false lumen showed  differential perfusion, but on today's study there is no evidence for enhancement of the false lumen although this study is timed for the pulmonary arteries and there is only minimal contrast entering the ascending aorta at the time of image acquisition. Lungs / Pleura: Hyperexpansion is consistent with emphysema. No pulmonary edema or pleural effusion. No focal airspace consolidation. Subsegmental atelectasis or linear scarring is seen in the lingula and left lower lobe. Upper Abdomen:  Unremarkable. MSK / Soft Tissues: Bone windows reveal no worrisome lytic or sclerotic osseous lesions. Review of the MIP images confirms the above findings. IMPRESSION: 1. No CT evidence for acute pulmonary embolus. 2. Chronic type B dissection of the thoracic aorta again noted without opacification of the false lumen on today's study although this may be in part related to early bolus timing for the pulmonary arterial study. 3. Emphysema without pulmonary edema or evidence  of focal pneumonia. Electronically Signed   By: Misty Stanley M.D.   On: 03/14/2015 21:51   Dg Chest Portable 1 View  03/14/2015  CLINICAL DATA:  65 year old male with chest pain and shortness of breath for 3 days. Initial encounter. Smoker EXAM: PORTABLE CHEST 1 VIEW COMPARISON:  02/04/2014 and earlier. FINDINGS: Portable AP upright view at 0932 hours. Interval removal right PICC line. Multiple EKG leads overlie the chest. Sequelae of CABG. Tortuous thoracic aorta appears stable. Other mediastinal contours are within normal limits. No pneumothorax, pulmonary edema or pleural effusion. Lung bases appear stable. There is suggestion of new patchy opacity in the peripheral right upper lobe (arrow). No other acute pulmonary opacity. IMPRESSION: 1. Suggestion of small area of acute patchy opacity in the peripheral right upper lobe, nonspecific but consider acute infectious exacerbation. 2. No associated pleural effusion and otherwise stable chest. Electronically  Signed   By: Genevie Ann M.D.   On: 03/14/2015 09:48    ROS  See HPI Eyes: Negative Ears:Negative for hearing loss, tinnitus Cardiovascular: Negative for  palpitations,irregular heartbeat,  near-syncope, orthopnea, paroxysmal nocturnal dyspnea and syncope,edema, claudication, cyanosis,.  Respiratory:   Negative for cough, hemoptysis, sputum production and wheezing.   Endocrine: Negative for cold intolerance and heat intolerance.  Hematologic/Lymphatic: Negative for adenopathy and bleeding problem. Does not bruise/bleed easily.  Musculoskeletal: Negative.   Gastrointestinal: Negative for nausea, vomiting, reflux, abdominal pain, diarrhea, constipation.   Genitourinary: Negative for bladder incontinence, dysuria, flank pain, frequency, hematuria, hesitancy, nocturia and urgency.  Neurological: Negative.  Allergic/Immunologic: Negative for environmental allergies.  Blood pressure 117/73, pulse 73, temperature 98.8 F (37.1 C), temperature source Oral, resp. rate 15, height 5' 10"  (1.778 m), weight 140 lb (63.504 kg), SpO2 98 %. Physical Exam PHYSICAL EXAM: Thin, smells of cigarette smoke, complaining of chest pain. Neck: No JVD, HJR, Bruit, or thyroid enlargement Lungs: Decreased breath sounds, No tachypnea, clear without wheezing, rales, or rhonchi Cardiovascular: RRR, PMI not displaced, heart sounds distant, no murmurs, gallops, bruit, thrill, or heave. Abdomen: BS normal. Soft without organomegaly, masses, lesions or tenderness. Extremities: without cyanosis, clubbing or edema. Good distal pulses bilateral SKin: Warm, no lesions or rashes  Musculoskeletal: No deformities Neuro: no focal signs   FINAL IMPRESSION:  1. Atherosclerotic cardiovascular disease, severe three-vessel.  2. Intact left ventricular size and global systolic function, ejection      fraction 60% without regional wall motion.  3. Mild mitral regurgitation.  4. Thoracic aortic aneurysm initiating at the origin of the  left      circumflex with minimal visible dissections seen.  5. Occluded saphenous vein graft to the right coronary and saphenous      vein graft to the obtuse marginal.  6. Patent saphenous vein graft to the diagonal and patent free left      internal mammary artery to the ramus.  These conduits form a Y      graft.  7. Occluded saphenous vein graft to the obtuse marginal.    PLAN/RECOMMENDATIONS:  Unfortunately, it does not appear that a  percutaneous option is available for revascularization of the right  coronary artery.  The disease is too extensive and there is significant  distal vessel disease.  The significant obstruction in the left  circumflex would not improve the myocardial perfusion to any great  degree given its distal location of small distal vessels as well.  Therefore, medical therapy only is indicated.  The patient has been  recently initiated on amlodipine with an  improvement in control of  systemic hypertension.  It should also help with his angina pectoris.  We will consider addition of isosorbide long acting when the patient  returns to the office.  He will be discharged as an outpatient without  change in his medications at this time.     Barnett Abu, M.D.  Electronically Signed   JHE/MEDQ  D:  03/18/2008  T:  03/19/2008  Job:  [id number]    Assessment/Plan: Chest pain with new EKG changes, flat Troponins. With ongoing chest pain and EKG changes suspect we will need to proceed with cardiac catherization. Already on Imdur 60 mg, can't increase metoprolol with bradycardia. Will add low dose amlodipine and check 2Decho.Consider IV heparin. Will transfer to [facility] for cardiac cath which will be done tomorrow. I have reviewed the risks, indications, and alternatives to angioplasty and stenting with the patient. Risks include but are not limited to bleeding, infection, vascular injury, stroke, myocardial infection, arrhythmia, kidney injury, radiation-related injury in  the case of prolonged fluoroscopy use, emergency cardiac surgery, and death. The patient understands the risks of serious complication is low (<4%) and he agrees to proceed.     History of CAD: NSTEMI 07/2007 treated with emergency CABG, 7 months later cardiac cath in 2010 with 2 occluded SVG to the RCA and SVG to the OM. Patent SVG to the diagonal and free LIMA to the LAD, normal LV function  Chronic type B dissecting thoracic aortic aneurysm  Hypertension BP up with pain.  Hyperlipidemia  COPD with ongoing tobacco abuse.   Chronic back pain with opioid addiction  Depression  Ermalinda Barrios 03/15/2015, 7:54 AM     The patient was seen and examined, and I agree with the assessment and plan as documented above and have discussed the management strategy with Gerrianne Scale PA-C. Pt reports similar symptoms have not been experienced since his MI and aortic dissection. While troponins are minimally elevated, new anterior TWI's are concerning for ischemia. Although some of his symptoms could certainly be due to exacerbation of chronic back pain with radiation to thorax due to pain med withdrawal, coronary ischemia will need to be ruled out. Will initiate IV heparin and transfer to [facility] for coronary angiography as he may have disease in the LIMA.  Kate Sable, MD, [facility]  03/15/2015 9:57 AM

## 2015-03-15 NOTE — Progress Notes (Signed)
[facility] PROGRESS NOTE  LINKEN GREEAR [id number] DOB: [birth year] DOA: 03/14/2015 PCP: [name], MD  Assessment/Plan: 1-chest pain: Heart score 6. New T-wave changes on EKG and mild troponin elevations. -Patient has been started on IV heparin drip -Continue aspirin, beta blocker and imdur -PRN analgesics with morphine -Discussed with cardiology service who is in agreement for need of cath -Patient will be transferred to [facility] for further evaluation and treatment; cardiology service will take over patient's care.  2-chronic aortic dissection type B: stable by Ct chest -continue monitoring  3-COPD: no wheezing. Currently compensated -will continue home medications regimen -advise to quit smoking   4-GERD: continue PPI and PRN GI cocktail -had hx of PUD in the past  5-chronic back pain: exacerbated by lack of pain medications after he was dismissed from pain clinic  -continue PRN pain meds  6-tobacco abuse:  -cessation counseling provided -continue nicotine patch  Code Status: Full code Family Communication: No family at bedside Disposition Plan: Patient will be transferred to [facility] for cath and further evaluation/treatment. Currently on heparin drip   Consultants:  cardiology   Procedures:  2-D echo: Pending  Left heart cath: Planned for 2/23  Antibiotics:  None  HPI/Subjective: Patient currently no complaining of shortness of breath orthopnea; but feeling terrible and with acute ongoing chest pain 9 out of 10. Mild troponin elevation and some new T-wave inversion of his EKG.  Objective: Filed Vitals:   03/15/15 0906 03/15/15 1350  BP: 116/78 90/50  Pulse: 60 69  Temp:  98.6 F (37 C)  Resp:  16    Intake/Output Summary (Last 24 hours) at 03/15/15 1511 Last data filed at 03/15/15 0500  Gross per 24 hour  Intake      0 ml  Output      3 ml  Net     -3 ml   Filed Weights   03/14/15 0913 03/14/15 1532   Weight: 63.504 kg (140 lb) 63.504 kg (140 lb)    Exam:   General:  Afebrile, complaining of chest pain (9/10 in intensity currently, left side of her chest and radiated to his jaw), feels terrible in general, but denies shortness of breath and orthopnea.  Cardiovascular: On auscultation you can feel some bone rubbing on his sternum; no rubs, no gallops; S1 and S2.  Respiratory: No wheezing, no crackles; good air movement.  Abdomen: soft, nontender, nondistended, positive bowel sounds  Musculoskeletal: cyanosis, no clubbing  Data Reviewed: Basic Metabolic Panel:  Recent Labs Lab 03/14/15 0923  NA 137  K 4.0  CL 102  CO2 23  GLUCOSE 105*  BUN 15  CREATININE 0.87  CALCIUM 9.6   Liver Function Tests:  Recent Labs Lab 03/14/15 0923  AST 27  ALT 10*  ALKPHOS 46  BILITOT 0.8  PROT 7.7  ALBUMIN 4.2    Recent Labs Lab 03/14/15 0923  LIPASE 22   CBC:  Recent Labs Lab 03/14/15 0923  WBC 6.7  NEUTROABS 5.0  HGB 13.8  HCT 40.1  MCV 94.1  PLT 185   Cardiac Enzymes:  Recent Labs Lab 03/14/15 0923 03/14/15 1230 03/14/15 1938 03/15/15 0121 03/15/15 0559  TROPONINI 0.10* 0.10* 0.11* 0.10* 0.08*   Studies: Ct Chest Wo Contrast  03/14/2015  CLINICAL DATA:  65 year old male with chest pain and shortness of breath for 3 days. Nausea. Smoker. Suggestion of acute opacity in the peripheral right lung on portable chest x-ray today. Initial encounter. EXAM: CT CHEST WITHOUT CONTRAST  TECHNIQUE: Multidetector CT imaging of the chest was performed following the standard protocol without IV contrast. COMPARISON:  Portable chest radiograph 0932 hours. Chest CTA 12/17/2012. FINDINGS: Minimal non dependent retained secretions in the trachea. Otherwise the major airways are patent. Pulmonary hyperinflation. Mild scarring in the lingula and left lower lobe. No confluent opacity in the right lung to correspond to the earlier radiographic appearance. No pleural effusion. No  pulmonary nodule identified. Chronic enlargement of the thoracic aorta, seen to be associated with type B dissection in 2014. Stable enlarged descending aorta caliber of 41 mm, while the distal arch caliber of 46 mm appears mildly increased since 2014 (44 mm at that time). Stable sequelae of CABG. No pericardial effusion. No mediastinal or hilar lymphadenopathy is evident. No axillary lymphadenopathy. Stable visualized upper abdominal viscera. The chronic dissection flap is partially calcified in the visualized upper abdominal. Aorta No acute osseous abnormality identified. IMPRESSION: 1. Chronic pulmonary hyperinflation with no acute pulmonary findings. No right lung opacity to correspond to the earlier radiographic appearance which must have been artifact. 2. Chronic aortic dissection. No evidence of rupture in the chest on this noncontrast study. The dissection was Stanford type B in 2014. Abnormally enlarged thoracic aortic caliber is stable to slightly increased since that time. Electronically Signed   By: Genevie Ann M.D.   On: 03/14/2015 11:30   Ct Angio Chest Pe W/cm &/or Wo Cm  03/14/2015  CLINICAL DATA:  Chest pain with elevated D-dimer. EXAM: CT ANGIOGRAPHY CHEST WITH CONTRAST TECHNIQUE: Multidetector CT imaging of the chest was performed using the standard protocol during bolus administration of intravenous contrast. Multiplanar CT image reconstructions and MIPs were obtained to evaluate the vascular anatomy. CONTRAST:  118mL OMNIPAQUE IOHEXOL 350 MG/ML SOLN COMPARISON:  Chest CT without contrast performed earlier the same day. 12/17/2012 FINDINGS: Mediastinum / Lymph Nodes: There is no axillary lymphadenopathy. No mediastinal lymphadenopathy. There is no hilar lymphadenopathy. The heart size is normal. No pericardial effusion. Patient is status post CABG. No filling defects within the opacified pulmonary arteries to suggest the presence of an acute pulmonary embolus. As seen on the study from 2014,  patient has a tight be dissection of the thoracic aorta. In 2014, the false lumen showed differential perfusion, but on today's study there is no evidence for enhancement of the false lumen although this study is timed for the pulmonary arteries and there is only minimal contrast entering the ascending aorta at the time of image acquisition. Lungs / Pleura: Hyperexpansion is consistent with emphysema. No pulmonary edema or pleural effusion. No focal airspace consolidation. Subsegmental atelectasis or linear scarring is seen in the lingula and left lower lobe. Upper Abdomen:  Unremarkable. MSK / Soft Tissues: Bone windows reveal no worrisome lytic or sclerotic osseous lesions. Review of the MIP images confirms the above findings. IMPRESSION: 1. No CT evidence for acute pulmonary embolus. 2. Chronic type B dissection of the thoracic aorta again noted without opacification of the false lumen on today's study although this may be in part related to early bolus timing for the pulmonary arterial study. 3. Emphysema without pulmonary edema or evidence of focal pneumonia. Electronically Signed   By: Misty Stanley M.D.   On: 03/14/2015 21:51   Dg Chest Portable 1 View  03/14/2015  CLINICAL DATA:  65 year old male with chest pain and shortness of breath for 3 days. Initial encounter. Smoker EXAM: PORTABLE CHEST 1 VIEW COMPARISON:  02/04/2014 and earlier. FINDINGS: Portable AP upright view at 0932 hours. Interval removal  right PICC line. Multiple EKG leads overlie the chest. Sequelae of CABG. Tortuous thoracic aorta appears stable. Other mediastinal contours are within normal limits. No pneumothorax, pulmonary edema or pleural effusion. Lung bases appear stable. There is suggestion of new patchy opacity in the peripheral right upper lobe (arrow). No other acute pulmonary opacity. IMPRESSION: 1. Suggestion of small area of acute patchy opacity in the peripheral right upper lobe, nonspecific but consider acute infectious  exacerbation. 2. No associated pleural effusion and otherwise stable chest. Electronically Signed   By: Genevie Ann M.D.   On: 03/14/2015 09:48    Scheduled Meds: . amLODipine  5 mg Oral Daily  . aspirin EC  325 mg Oral Daily  . isosorbide mononitrate  60 mg Oral BID  . metoprolol tartrate  25 mg Oral BID  . nicotine  14 mg Transdermal Daily  . pantoprazole  40 mg Oral BID  . sertraline  50 mg Oral BID  . traZODone  50 mg Oral QHS   Continuous Infusions: . heparin 750 Units/hr (03/15/15 1050)    Principal Problem:   Chest pain Active Problems:   Aortic dissection (HCC)   CAD (coronary artery disease)   COPD mixed type (HCC)   Tobacco abuse   Elevated troponin    Time spent: 35 minutes    Barton Dubois  [facility] Pager [phone]. If 7PM-7AM, please contact night-coverage at [email or web address], password [email or web address] 03/15/2015, 3:11 PM  LOS: 0 days

## 2015-03-16 ENCOUNTER — Inpatient Hospital Stay (HOSPITAL_COMMUNITY): Payer: Medicare PPO

## 2015-03-16 ENCOUNTER — Encounter (HOSPITAL_COMMUNITY)
Admission: EM | Disposition: A | Discharge status: Home / Self Care | Payer: Self-pay | Source: Home / Self Care | Attending: Cardiovascular Disease

## 2015-03-16 DIAGNOSIS — I214 Non-ST elevation (NSTEMI) myocardial infarction: Secondary | ICD-10-CM | POA: Insufficient documentation

## 2015-03-16 DIAGNOSIS — R079 Chest pain, unspecified: Secondary | ICD-10-CM

## 2015-03-16 HISTORY — PX: CARDIAC CATHETERIZATION: SHX172

## 2015-03-16 LAB — POCT ACTIVATED CLOTTING TIME
Activated Clotting Time: 240 seconds
Activated Clotting Time: 286 seconds

## 2015-03-16 LAB — CBC
HEMATOCRIT: 30.5 % — AB (ref 39.0–52.0)
HEMOGLOBIN: 10.1 g/dL — AB (ref 13.0–17.0)
MCH: 31.2 pg (ref 26.0–34.0)
MCHC: 33.1 g/dL (ref 30.0–36.0)
MCV: 94.1 fL (ref 78.0–100.0)
Platelets: 130 10*3/uL — ABNORMAL LOW (ref 150–400)
RBC: 3.24 MIL/uL — ABNORMAL LOW (ref 4.22–5.81)
RDW: 14 % (ref 11.5–15.5)
WBC: 6 10*3/uL (ref 4.0–10.5)

## 2015-03-16 LAB — HEPARIN LEVEL (UNFRACTIONATED): Heparin Unfractionated: 0.45 IU/mL (ref 0.30–0.70)

## 2015-03-16 LAB — MRSA PCR SCREENING: MRSA BY PCR: POSITIVE — AB

## 2015-03-16 SURGERY — CORONARY/GRAFT ANGIOGRAPHY

## 2015-03-16 MED ORDER — SODIUM CHLORIDE 0.9% FLUSH
3.0000 mL | Freq: Two times a day (BID) | INTRAVENOUS | Status: DC
  Administered 2015-03-16 – 2015-03-17 (×2): 3 mL via INTRAVENOUS

## 2015-03-16 MED ORDER — CHLORHEXIDINE GLUCONATE CLOTH 2 % EX PADS
6.0000 | MEDICATED_PAD | Freq: Every day | CUTANEOUS | Status: DC
  Administered 2015-03-16 – 2015-03-17 (×2): 6 via TOPICAL

## 2015-03-16 MED ORDER — NITROGLYCERIN IN D5W 200-5 MCG/ML-% IV SOLN
0.0000 ug/min | INTRAVENOUS | Status: DC
  Administered 2015-03-16: 8 ug/min via INTRAVENOUS

## 2015-03-16 MED ORDER — MIDAZOLAM HCL 2 MG/2ML IJ SOLN
INTRAMUSCULAR | Status: AC
  Filled 2015-03-16: qty 2

## 2015-03-16 MED ORDER — NITROGLYCERIN 1 MG/10 ML FOR IR/CATH LAB
INTRA_ARTERIAL | Status: DC | PRN
  Administered 2015-03-16 (×2): 200 ug via INTRACORONARY

## 2015-03-16 MED ORDER — IOHEXOL 350 MG/ML SOLN
INTRAVENOUS | Status: DC | PRN
  Administered 2015-03-16: 115 mL via INTRACARDIAC

## 2015-03-16 MED ORDER — MIDAZOLAM HCL 2 MG/2ML IJ SOLN
INTRAMUSCULAR | Status: DC | PRN
  Administered 2015-03-16 (×2): 2 mg via INTRAVENOUS

## 2015-03-16 MED ORDER — NITROGLYCERIN IN D5W 200-5 MCG/ML-% IV SOLN
INTRAVENOUS | Status: AC
  Filled 2015-03-16: qty 250

## 2015-03-16 MED ORDER — SODIUM CHLORIDE 0.9 % IV SOLN: 250.0000 mL | INTRAVENOUS | Status: DC | PRN

## 2015-03-16 MED ORDER — LIDOCAINE HCL (PF) 1 % IJ SOLN
INTRAMUSCULAR | Status: AC
  Filled 2015-03-16: qty 30

## 2015-03-16 MED ORDER — FENTANYL CITRATE (PF) 100 MCG/2ML IJ SOLN
25.0000 ug | INTRAMUSCULAR | Status: DC | PRN
  Administered 2015-03-16 – 2015-03-17 (×9): 25 ug via INTRAVENOUS
  Filled 2015-03-16 (×9): qty 2

## 2015-03-16 MED ORDER — LIDOCAINE HCL (PF) 1 % IJ SOLN
INTRAMUSCULAR | Status: DC | PRN
  Administered 2015-03-16: 3 mL

## 2015-03-16 MED ORDER — HEPARIN SODIUM (PORCINE) 1000 UNIT/ML IJ SOLN
INTRAMUSCULAR | Status: AC
  Filled 2015-03-16: qty 1

## 2015-03-16 MED ORDER — ASPIRIN 81 MG PO CHEW
81.0000 mg | CHEWABLE_TABLET | Freq: Every day | ORAL | Status: DC
  Administered 2015-03-17: 81 mg via ORAL
  Filled 2015-03-16: qty 1

## 2015-03-16 MED ORDER — TICAGRELOR 90 MG PO TABS
90.0000 mg | ORAL_TABLET | Freq: Two times a day (BID) | ORAL | Status: DC
  Administered 2015-03-17: 90 mg via ORAL
  Filled 2015-03-16: qty 1

## 2015-03-16 MED ORDER — SODIUM CHLORIDE 0.9% FLUSH: 3.0000 mL | INTRAVENOUS | Status: DC | PRN

## 2015-03-16 MED ORDER — MUPIROCIN 2 % EX OINT
1.0000 "application " | TOPICAL_OINTMENT | Freq: Two times a day (BID) | CUTANEOUS | Status: DC
  Administered 2015-03-16 – 2015-03-17 (×3): 1 via NASAL

## 2015-03-16 MED ORDER — FENTANYL CITRATE (PF) 100 MCG/2ML IJ SOLN
INTRAMUSCULAR | Status: DC | PRN
  Administered 2015-03-16 (×2): 50 ug via INTRAVENOUS

## 2015-03-16 MED ORDER — HEPARIN SODIUM (PORCINE) 1000 UNIT/ML IJ SOLN
INTRAMUSCULAR | Status: DC | PRN
  Administered 2015-03-16: 3000 [IU] via INTRAVENOUS
  Administered 2015-03-16: 4000 [IU] via INTRAVENOUS
  Administered 2015-03-16: 3000 [IU] via INTRAVENOUS

## 2015-03-16 MED ORDER — VERAPAMIL HCL 2.5 MG/ML IV SOLN
INTRAVENOUS | Status: DC | PRN
  Administered 2015-03-16: 10 mL via INTRA_ARTERIAL

## 2015-03-16 MED ORDER — TICAGRELOR 90 MG PO TABS
ORAL_TABLET | ORAL | Status: DC | PRN
  Administered 2015-03-16: 180 mg via ORAL

## 2015-03-16 MED ORDER — SODIUM CHLORIDE 0.9 % IV SOLN
INTRAVENOUS | Status: AC
  Administered 2015-03-16: 19:00:00 via INTRAVENOUS

## 2015-03-16 MED ORDER — ZOLPIDEM TARTRATE 5 MG PO TABS
5.0000 mg | ORAL_TABLET | Freq: Every evening | ORAL | Status: DC | PRN
Start: 2015-03-16 — End: 2015-03-17
  Administered 2015-03-16: 5 mg via ORAL
  Filled 2015-03-16: qty 1

## 2015-03-16 MED ORDER — FENTANYL CITRATE (PF) 100 MCG/2ML IJ SOLN
INTRAMUSCULAR | Status: AC
  Filled 2015-03-16: qty 2

## 2015-03-16 MED ORDER — NITROGLYCERIN 1 MG/10 ML FOR IR/CATH LAB
INTRA_ARTERIAL | Status: AC
  Filled 2015-03-16: qty 10

## 2015-03-16 MED ORDER — HEPARIN (PORCINE) IN NACL 2-0.9 UNIT/ML-% IJ SOLN
INTRAMUSCULAR | Status: DC | PRN
  Administered 2015-03-16: 18:00:00

## 2015-03-16 MED ORDER — TICAGRELOR 90 MG PO TABS
ORAL_TABLET | ORAL | Status: AC
  Filled 2015-03-16: qty 2

## 2015-03-16 MED ORDER — HEPARIN (PORCINE) IN NACL 2-0.9 UNIT/ML-% IJ SOLN
INTRAMUSCULAR | Status: AC
  Filled 2015-03-16: qty 1000

## 2015-03-16 MED ORDER — MUPIROCIN 2 % EX OINT
TOPICAL_OINTMENT | Freq: Two times a day (BID) | CUTANEOUS | Status: DC
  Administered 2015-03-16: 06:00:00 via NASAL
  Filled 2015-03-16: qty 22

## 2015-03-16 MED ORDER — VERAPAMIL HCL 2.5 MG/ML IV SOLN
INTRAVENOUS | Status: AC
  Filled 2015-03-16: qty 2

## 2015-03-16 MED FILL — Fentanyl Citrate Preservative Free (PF) Inj 100 MCG/2ML: INTRAMUSCULAR | Qty: 2 | Status: AC

## 2015-03-16 SURGICAL SUPPLY — 17 items
BALLN EUPHORA RX 2.0X15 (BALLOONS) ×3
BALLN ~~LOC~~ EUPHORA RX 3.0X12 (BALLOONS) ×3
BALLOON EUPHORA RX 2.0X15 (BALLOONS) IMPLANT
BALLOON ~~LOC~~ EUPHORA RX 3.0X12 (BALLOONS) IMPLANT
CATH INFINITI 5FR MULTPACK ANG (CATHETERS) ×2 IMPLANT
DEVICE RAD COMP TR BAND LRG (VASCULAR PRODUCTS) ×2 IMPLANT
GLIDESHEATH SLEND SS 6F .021 (SHEATH) ×2 IMPLANT
GUIDE CATH RUNWAY 6FR AL 1 (CATHETERS) ×2 IMPLANT
KIT ENCORE 26 ADVANTAGE (KITS) ×2 IMPLANT
KIT HEART LEFT (KITS) ×3 IMPLANT
PACK CARDIAC CATHETERIZATION (CUSTOM PROCEDURE TRAY) ×3 IMPLANT
STENT PROMUS PREM MR 2.75X16 (Permanent Stent) ×2 IMPLANT
TRANSDUCER W/STOPCOCK (MISCELLANEOUS) ×3 IMPLANT
TUBING CIL FLEX 10 FLL-RA (TUBING) ×3 IMPLANT
WIRE COUGAR XT STRL 190CM (WIRE) ×2 IMPLANT
WIRE HI TORQ VERSACORE-J 145CM (WIRE) ×2 IMPLANT
WIRE SAFE-T 1.5MM-J .035X260CM (WIRE) ×2 IMPLANT

## 2015-03-16 NOTE — Progress Notes (Signed)
ANTICOAGULATION CONSULT NOTE - Follow-up Consult  Pharmacy Consult for Heparin Indication: chest pain/ACS  Allergies  Allergen Reactions  . Codeine Other (See Comments)    Causes GI Upset   Patient Measurements: Height: 5\' 10"  (177.8 cm) Weight: 136 lb 3.9 oz (61.8 kg) IBW/kg (Calculated) : 73 HEPARIN DW (KG): 63.5  Vital Signs: Temp: 98.6 F (37 C) (02/23 0335) Temp Source: Oral (02/23 0335) BP: 127/75 mmHg (02/23 0400) Pulse Rate: 63 (02/23 0400)  Labs:  Recent Labs  03/14/15 0923  03/14/15 1938 03/15/15 0121 03/15/15 0559 03/15/15 1735 03/16/15 0445  HGB 13.8  --   --   --   --   --   --   HCT 40.1  --   --   --   --   --   --   PLT 185  --   --   --   --   --   --   LABPROT 14.4  --   --   --   --   --   --   INR 1.10  --   --   --   --   --   --   HEPARINUNFRC  --   --   --   --   --  <0.10* 0.45  CREATININE 0.87  --   --   --   --   --   --   TROPONINI 0.10*  < > 0.11* 0.10* 0.08*  --   --   < > = values in this interval not displayed.  Estimated Creatinine Clearance: 76 mL/min (by C-G formula based on Cr of 0.87).  Assessment: This is a 65 year old male on heparin for r/o ACS. Heparin level therapeutic on 950 units/hr. No bleeding noted. Plan for cath today.  Goal of Therapy:  Heparin level 0.3-0.7 units/ml Monitor platelets by anticoagulation protocol: Yes   Plan:  Continue heparin at 950 units/hr F/u post cath  Sherlon Handing, PharmD, BCPS Clinical pharmacist, pager [phone] 03/16/2015,5:42 AM

## 2015-03-16 NOTE — Interval H&P Note (Signed)
Cath Lab Visit (complete for each Cath Lab visit)  Clinical Evaluation Leading to the Procedure:   ACS: Yes.    Non-ACS:    Anginal Classification: CCS IV  Anti-ischemic medical therapy: Maximal Therapy (2 or more classes of medications)  Non-Invasive Test Results: No non-invasive testing performed  Prior CABG: Previous CABG      History and Physical Interval Note:  03/16/2015 5:07 PM  [name]  has presented today for surgery, with the diagnosis of cp  The various methods of treatment have been discussed with the patient and family. After consideration of risks, benefits and other options for treatment, the patient has consented to  Procedure(s): Left Heart Cath and Cors/Grafts Angiography (N/A) as a surgical intervention .  The patient's history has been reviewed, patient examined, no change in status, stable for surgery.  I have reviewed the patient's chart and labs.  Questions were answered to the patient's satisfaction.     Sherren Mocha

## 2015-03-16 NOTE — Progress Notes (Signed)
Echocardiogram 2D Echocardiogram has been performed.  [name] 03/16/2015, 12:57 PM

## 2015-03-16 NOTE — Progress Notes (Signed)
Patient Name:  [name], DOB: 02-13-1950, MRN: [id number] Primary Doctor: [name], MD Primary Cardiologist:   Date: 03/16/2015   SUBJECTIVE    I was called to see the patient because of ongoing chest discomfort. He describes pain that can be as severe as 7 out of 10. This has been persistent for several days.   Past Medical History  Diagnosis Date  . Cardiac tamponade   . Aortic dissection (HCC)     TYPE 3  . HTN (hypertension)   . CAD (coronary artery disease)   . Hypercholesterolemia   . Chronic back pain   . Hiatal hernia   . Schatzki's ring   . Chronic fatigue   . Chronic pain   . Peptic ulcer disease 08/2010    EGD   . COPD (chronic obstructive pulmonary disease) ([place])   . Chronic bronchitis ([place])    Filed Vitals:   03/16/15 0400 03/16/15 0500 03/16/15 0600 03/16/15 0749  BP: 127/75 113/71 117/78 134/83  Pulse: 63 63 64 64  Temp:    97.9 F (36.6 C)  TempSrc:    Oral  Resp: 15 14 18 13   Height:      Weight:      SpO2: 100% 97% 97% 99%    Intake/Output Summary (Last 24 hours) at 03/16/15 0850 Last data filed at 03/16/15 0800  Gross per 24 hour  Intake  406.4 ml  Output      0 ml  Net  406.4 ml   Filed Weights   03/14/15 0913 03/14/15 1532 03/15/15 2139  Weight: 140 lb (63.504 kg) 140 lb (63.504 kg) 136 lb 3.9 oz (61.8 kg)     LABS: Basic Metabolic Panel:  Recent Labs  03/14/15 0923  NA 137  K 4.0  CL 102  CO2 23  GLUCOSE 105*  BUN 15  CREATININE 0.87  CALCIUM 9.6   Liver Function Tests:  Recent Labs  03/14/15 0923  AST 27  ALT 10*  ALKPHOS 46  BILITOT 0.8  PROT 7.7  ALBUMIN 4.2    Recent Labs  03/14/15 0923  LIPASE 22   CBC:  Recent Labs  03/14/15 0923  WBC 6.7  NEUTROABS 5.0  HGB 13.8  HCT 40.1  MCV 94.1  PLT 185   Cardiac Enzymes:  Recent Labs  03/14/15 1938 03/15/15 0121 03/15/15 0559  TROPONINI 0.11* 0.10* 0.08*   BNP: Invalid input(s): POCBNP D-Dimer:  Recent Labs  03/14/15 1938   DDIMER 3.90*   Thyroid Function Tests: No results for input(s): TSH, T4TOTAL, T3FREE, THYROIDAB in the last 72 hours.  Invalid input(s): FREET3  RADIOLOGY: Ct Chest Wo Contrast  03/14/2015  CLINICAL DATA:  65 year old male with chest pain and shortness of breath for 3 days. Nausea. Smoker. Suggestion of acute opacity in the peripheral right lung on portable chest x-ray today. Initial encounter. EXAM: CT CHEST WITHOUT CONTRAST TECHNIQUE: Multidetector CT imaging of the chest was performed following the standard protocol without IV contrast. COMPARISON:  Portable chest radiograph 0932 hours. Chest CTA 12/17/2012. FINDINGS: Minimal non dependent retained secretions in the trachea. Otherwise the major airways are patent. Pulmonary hyperinflation. Mild scarring in the lingula and left lower lobe. No confluent opacity in the right lung to correspond to the earlier radiographic appearance. No pleural effusion. No pulmonary nodule identified. Chronic enlargement of the thoracic aorta, seen to be associated with type B dissection in 2014. Stable enlarged descending aorta caliber of 41 mm, while the distal arch  caliber of 46 mm appears mildly increased since 2014 (44 mm at that time). Stable sequelae of CABG. No pericardial effusion. No mediastinal or hilar lymphadenopathy is evident. No axillary lymphadenopathy. Stable visualized upper abdominal viscera. The chronic dissection flap is partially calcified in the visualized upper abdominal. Aorta No acute osseous abnormality identified. IMPRESSION: 1. Chronic pulmonary hyperinflation with no acute pulmonary findings. No right lung opacity to correspond to the earlier radiographic appearance which must have been artifact. 2. Chronic aortic dissection. No evidence of rupture in the chest on this noncontrast study. The dissection was Stanford type B in 2014. Abnormally enlarged thoracic aortic caliber is stable to slightly increased since that time. Electronically  Signed   By: Genevie Ann M.D.   On: 03/14/2015 11:30   Ct Angio Chest Pe W/cm &/or Wo Cm  03/14/2015  CLINICAL DATA:  Chest pain with elevated D-dimer. EXAM: CT ANGIOGRAPHY CHEST WITH CONTRAST TECHNIQUE: Multidetector CT imaging of the chest was performed using the standard protocol during bolus administration of intravenous contrast. Multiplanar CT image reconstructions and MIPs were obtained to evaluate the vascular anatomy. CONTRAST:  153mL OMNIPAQUE IOHEXOL 350 MG/ML SOLN COMPARISON:  Chest CT without contrast performed earlier the same day. 12/17/2012 FINDINGS: Mediastinum / Lymph Nodes: There is no axillary lymphadenopathy. No mediastinal lymphadenopathy. There is no hilar lymphadenopathy. The heart size is normal. No pericardial effusion. Patient is status post CABG. No filling defects within the opacified pulmonary arteries to suggest the presence of an acute pulmonary embolus. As seen on the study from 2014, patient has a tight be dissection of the thoracic aorta. In 2014, the false lumen showed differential perfusion, but on today's study there is no evidence for enhancement of the false lumen although this study is timed for the pulmonary arteries and there is only minimal contrast entering the ascending aorta at the time of image acquisition. Lungs / Pleura: Hyperexpansion is consistent with emphysema. No pulmonary edema or pleural effusion. No focal airspace consolidation. Subsegmental atelectasis or linear scarring is seen in the lingula and left lower lobe. Upper Abdomen:  Unremarkable. MSK / Soft Tissues: Bone windows reveal no worrisome lytic or sclerotic osseous lesions. Review of the MIP images confirms the above findings. IMPRESSION: 1. No CT evidence for acute pulmonary embolus. 2. Chronic type B dissection of the thoracic aorta again noted without opacification of the false lumen on today's study although this may be in part related to early bolus timing for the pulmonary arterial study. 3.  Emphysema without pulmonary edema or evidence of focal pneumonia. Electronically Signed   By: Misty Stanley M.D.   On: 03/14/2015 21:51   Dg Chest Portable 1 View  03/14/2015  CLINICAL DATA:  65 year old male with chest pain and shortness of breath for 3 days. Initial encounter. Smoker EXAM: PORTABLE CHEST 1 VIEW COMPARISON:  02/04/2014 and earlier. FINDINGS: Portable AP upright view at 0932 hours. Interval removal right PICC line. Multiple EKG leads overlie the chest. Sequelae of CABG. Tortuous thoracic aorta appears stable. Other mediastinal contours are within normal limits. No pneumothorax, pulmonary edema or pleural effusion. Lung bases appear stable. There is suggestion of new patchy opacity in the peripheral right upper lobe (arrow). No other acute pulmonary opacity. IMPRESSION: 1. Suggestion of small area of acute patchy opacity in the peripheral right upper lobe, nonspecific but consider acute infectious exacerbation. 2. No associated pleural effusion and otherwise stable chest. Electronically Signed   By: Genevie Ann M.D.   On: 03/14/2015 09:48  PHYSICAL EXAM  patient is lying in bed with family in the room. He does not appear to be uncomfortable at this time. Lungs reveal scattered rhonchi. There is pain to deep inspiration along the left anterior chest. There is also some pain to palpation along the left che  ECG: I have reviewed old and current EKGs. The patient has persistent T wave changes. These are new since the last EKG in 2015.  ASSESSMENT AND PLAN:    Aortic dissection [facility])     The patient did have a CT on February 21 showing that his dissection was stable. As of today, the pain does not clinically appear to be related to his dissection.    CAD (coronary artery disease)   Elevated troponin     It is difficult to assess his current pain. T wave changes are new since 2015. He does have slight troponin elevation. His persistent pain for several days is very difficult to interpret  including the fact that there is a component of discomfort with deep inspiration. I have spoken with Dr. Burt Knack who will be proceeding with his cath later today. I feel he does not need to be cathed urgently at this moment.   Dola Argyle 03/16/2015 8:50 AM

## 2015-03-16 NOTE — H&P (View-Only) (Signed)
Patient Name:  [name], DOB: [birth year], MRN: [id number] Primary Doctor: [name], MD Primary Cardiologist:   Date: 03/16/2015   SUBJECTIVE    I was called to see the patient because of ongoing chest discomfort. He describes pain that can be as severe as 7 out of 10. This has been persistent for several days.   Past Medical History  Diagnosis Date  . Cardiac tamponade   . Aortic dissection (HCC)     TYPE 3  . HTN (hypertension)   . CAD (coronary artery disease)   . Hypercholesterolemia   . Chronic back pain   . Hiatal hernia   . Schatzki's ring   . Chronic fatigue   . Chronic pain   . Peptic ulcer disease 08/2010    EGD   . COPD (chronic obstructive pulmonary disease) ([place])   . Chronic bronchitis ([place])    Filed Vitals:   03/16/15 0400 03/16/15 0500 03/16/15 0600 03/16/15 0749  BP: 127/75 113/71 117/78 134/83  Pulse: 63 63 64 64  Temp:    97.9 F (36.6 C)  TempSrc:    Oral  Resp: 15 14 18 13   Height:      Weight:      SpO2: 100% 97% 97% 99%    Intake/Output Summary (Last 24 hours) at 03/16/15 0850 Last data filed at 03/16/15 0800  Gross per 24 hour  Intake  406.4 ml  Output      0 ml  Net  406.4 ml   Filed Weights   03/14/15 0913 03/14/15 1532 03/15/15 2139  Weight: 140 lb (63.504 kg) 140 lb (63.504 kg) 136 lb 3.9 oz (61.8 kg)     LABS: Basic Metabolic Panel:  Recent Labs  03/14/15 0923  NA 137  K 4.0  CL 102  CO2 23  GLUCOSE 105*  BUN 15  CREATININE 0.87  CALCIUM 9.6   Liver Function Tests:  Recent Labs  03/14/15 0923  AST 27  ALT 10*  ALKPHOS 46  BILITOT 0.8  PROT 7.7  ALBUMIN 4.2    Recent Labs  03/14/15 0923  LIPASE 22   CBC:  Recent Labs  03/14/15 0923  WBC 6.7  NEUTROABS 5.0  HGB 13.8  HCT 40.1  MCV 94.1  PLT 185   Cardiac Enzymes:  Recent Labs  03/14/15 1938 03/15/15 0121 03/15/15 0559  TROPONINI 0.11* 0.10* 0.08*   BNP: Invalid input(s): POCBNP D-Dimer:  Recent Labs  03/14/15 1938   DDIMER 3.90*   Thyroid Function Tests: No results for input(s): TSH, T4TOTAL, T3FREE, THYROIDAB in the last 72 hours.  Invalid input(s): FREET3  RADIOLOGY: Ct Chest Wo Contrast  03/14/2015  CLINICAL DATA:  65 year old male with chest pain and shortness of breath for 3 days. Nausea. Smoker. Suggestion of acute opacity in the peripheral right lung on portable chest x-ray today. Initial encounter. EXAM: CT CHEST WITHOUT CONTRAST TECHNIQUE: Multidetector CT imaging of the chest was performed following the standard protocol without IV contrast. COMPARISON:  Portable chest radiograph 0932 hours. Chest CTA 12/17/2012. FINDINGS: Minimal non dependent retained secretions in the trachea. Otherwise the major airways are patent. Pulmonary hyperinflation. Mild scarring in the lingula and left lower lobe. No confluent opacity in the right lung to correspond to the earlier radiographic appearance. No pleural effusion. No pulmonary nodule identified. Chronic enlargement of the thoracic aorta, seen to be associated with type B dissection in 2014. Stable enlarged descending aorta caliber of 41 mm, while the distal arch  caliber of 46 mm appears mildly increased since 2014 (44 mm at that time). Stable sequelae of CABG. No pericardial effusion. No mediastinal or hilar lymphadenopathy is evident. No axillary lymphadenopathy. Stable visualized upper abdominal viscera. The chronic dissection flap is partially calcified in the visualized upper abdominal. Aorta No acute osseous abnormality identified. IMPRESSION: 1. Chronic pulmonary hyperinflation with no acute pulmonary findings. No right lung opacity to correspond to the earlier radiographic appearance which must have been artifact. 2. Chronic aortic dissection. No evidence of rupture in the chest on this noncontrast study. The dissection was Stanford type B in 2014. Abnormally enlarged thoracic aortic caliber is stable to slightly increased since that time. Electronically  Signed   By: Genevie Ann M.D.   On: 03/14/2015 11:30   Ct Angio Chest Pe W/cm &/or Wo Cm  03/14/2015  CLINICAL DATA:  Chest pain with elevated D-dimer. EXAM: CT ANGIOGRAPHY CHEST WITH CONTRAST TECHNIQUE: Multidetector CT imaging of the chest was performed using the standard protocol during bolus administration of intravenous contrast. Multiplanar CT image reconstructions and MIPs were obtained to evaluate the vascular anatomy. CONTRAST:  148mL OMNIPAQUE IOHEXOL 350 MG/ML SOLN COMPARISON:  Chest CT without contrast performed earlier the same day. 12/17/2012 FINDINGS: Mediastinum / Lymph Nodes: There is no axillary lymphadenopathy. No mediastinal lymphadenopathy. There is no hilar lymphadenopathy. The heart size is normal. No pericardial effusion. Patient is status post CABG. No filling defects within the opacified pulmonary arteries to suggest the presence of an acute pulmonary embolus. As seen on the study from 2014, patient has a tight be dissection of the thoracic aorta. In 2014, the false lumen showed differential perfusion, but on today's study there is no evidence for enhancement of the false lumen although this study is timed for the pulmonary arteries and there is only minimal contrast entering the ascending aorta at the time of image acquisition. Lungs / Pleura: Hyperexpansion is consistent with emphysema. No pulmonary edema or pleural effusion. No focal airspace consolidation. Subsegmental atelectasis or linear scarring is seen in the lingula and left lower lobe. Upper Abdomen:  Unremarkable. MSK / Soft Tissues: Bone windows reveal no worrisome lytic or sclerotic osseous lesions. Review of the MIP images confirms the above findings. IMPRESSION: 1. No CT evidence for acute pulmonary embolus. 2. Chronic type B dissection of the thoracic aorta again noted without opacification of the false lumen on today's study although this may be in part related to early bolus timing for the pulmonary arterial study. 3.  Emphysema without pulmonary edema or evidence of focal pneumonia. Electronically Signed   By: Misty Stanley M.D.   On: 03/14/2015 21:51   Dg Chest Portable 1 View  03/14/2015  CLINICAL DATA:  65 year old male with chest pain and shortness of breath for 3 days. Initial encounter. Smoker EXAM: PORTABLE CHEST 1 VIEW COMPARISON:  02/04/2014 and earlier. FINDINGS: Portable AP upright view at 0932 hours. Interval removal right PICC line. Multiple EKG leads overlie the chest. Sequelae of CABG. Tortuous thoracic aorta appears stable. Other mediastinal contours are within normal limits. No pneumothorax, pulmonary edema or pleural effusion. Lung bases appear stable. There is suggestion of new patchy opacity in the peripheral right upper lobe (arrow). No other acute pulmonary opacity. IMPRESSION: 1. Suggestion of small area of acute patchy opacity in the peripheral right upper lobe, nonspecific but consider acute infectious exacerbation. 2. No associated pleural effusion and otherwise stable chest. Electronically Signed   By: Genevie Ann M.D.   On: 03/14/2015 09:48  PHYSICAL EXAM  patient is lying in bed with family in the room. He does not appear to be uncomfortable at this time. Lungs reveal scattered rhonchi. There is pain to deep inspiration along the left anterior chest. There is also some pain to palpation along the left che  ECG: I have reviewed old and current EKGs. The patient has persistent T wave changes. These are new since the last EKG in 2015.  ASSESSMENT AND PLAN:    Aortic dissection [facility])     The patient did have a CT on February 21 showing that his dissection was stable. As of today, the pain does not clinically appear to be related to his dissection.    CAD (coronary artery disease)   Elevated troponin     It is difficult to assess his current pain. T wave changes are new since 2015. He does have slight troponin elevation. His persistent pain for several days is very difficult to interpret  including the fact that there is a component of discomfort with deep inspiration. I have spoken with Dr. Burt Knack who will be proceeding with his cath later today. I feel he does not need to be cathed urgently at this moment.   Dola Argyle 03/16/2015 8:50 AM

## 2015-03-17 ENCOUNTER — Ambulatory Visit: Payer: Self-pay | Admitting: Internal Medicine

## 2015-03-17 ENCOUNTER — Encounter (HOSPITAL_COMMUNITY): Payer: Self-pay | Admitting: Cardiovascular Disease

## 2015-03-17 LAB — CBC
HCT: 31.6 % — ABNORMAL LOW (ref 39.0–52.0)
Hemoglobin: 10.9 g/dL — ABNORMAL LOW (ref 13.0–17.0)
MCH: 32.3 pg (ref 26.0–34.0)
MCHC: 34.5 g/dL (ref 30.0–36.0)
MCV: 93.8 fL (ref 78.0–100.0)
PLATELETS: 128 10*3/uL — AB (ref 150–400)
RBC: 3.37 MIL/uL — AB (ref 4.22–5.81)
RDW: 14 % (ref 11.5–15.5)
WBC: 4.7 10*3/uL (ref 4.0–10.5)

## 2015-03-17 LAB — BASIC METABOLIC PANEL
ANION GAP: 11 (ref 5–15)
BUN: 9 mg/dL (ref 6–20)
CHLORIDE: 101 mmol/L (ref 101–111)
CO2: 22 mmol/L (ref 22–32)
CREATININE: 0.66 mg/dL (ref 0.61–1.24)
Calcium: 9 mg/dL (ref 8.9–10.3)
GFR calc non Af Amer: >60 mL/min (ref >60)
Glucose, Bld: 84 mg/dL (ref 65–99)
Potassium: 3.6 mmol/L (ref 3.5–5.1)
SODIUM: 134 mmol/L — AB (ref 135–145)

## 2015-03-17 MED ORDER — DIPHENHYDRAMINE HCL 50 MG/ML IJ SOLN
25.0000 mg | Freq: Once | INTRAMUSCULAR | Status: AC
  Administered 2015-03-17: 25 mg via INTRAVENOUS

## 2015-03-17 MED ORDER — TICAGRELOR 90 MG PO TABS: 90.0000 mg | ORAL_TABLET | Freq: Two times a day (BID) | ORAL | Status: DC

## 2015-03-17 MED ORDER — LORAZEPAM 2 MG/ML IJ SOLN
0.2500 mg | Freq: Once | INTRAMUSCULAR | Status: DC
  Filled 2015-03-17: qty 1

## 2015-03-17 MED ORDER — METOCLOPRAMIDE HCL 5 MG/ML IJ SOLN
10.0000 mg | Freq: Once | INTRAMUSCULAR | Status: AC
  Administered 2015-03-17: 10 mg via INTRAVENOUS
  Filled 2015-03-17: qty 2

## 2015-03-17 MED ORDER — AMLODIPINE BESYLATE 5 MG PO TABS: 5.0000 mg | ORAL_TABLET | Freq: Every day | ORAL | Status: DC

## 2015-03-17 MED ORDER — FENTANYL CITRATE (PF) 100 MCG/2ML IJ SOLN
50.0000 ug | Freq: Once | INTRAMUSCULAR | Status: AC
  Administered 2015-03-17: 50 ug via INTRAVENOUS

## 2015-03-17 MED ORDER — ATORVASTATIN CALCIUM 80 MG PO TABS: 80.0000 mg | ORAL_TABLET | Freq: Every day | ORAL | Status: DC

## 2015-03-17 MED ORDER — NICOTINE 14 MG/24HR TD PT24: 14.0000 mg | MEDICATED_PATCH | Freq: Every day | TRANSDERMAL | Status: DC

## 2015-03-17 MED ORDER — ATORVASTATIN CALCIUM 80 MG PO TABS
80.0000 mg | ORAL_TABLET | Freq: Every day | ORAL | Status: DC
  Administered 2015-03-17: 80 mg via ORAL
  Filled 2015-03-17: qty 1

## 2015-03-17 MED ORDER — KETOROLAC TROMETHAMINE 30 MG/ML IJ SOLN
30.0000 mg | Freq: Once | INTRAMUSCULAR | Status: AC
  Administered 2015-03-17: 30 mg via INTRAVENOUS

## 2015-03-17 MED ORDER — MORPHINE SULFATE (PF) 4 MG/ML IV SOLN
3.0000 mg | Freq: Once | INTRAVENOUS | Status: AC
  Administered 2015-03-17: 3 mg via INTRAVENOUS
  Filled 2015-03-17: qty 1

## 2015-03-17 MED ORDER — ASPIRIN 81 MG PO CHEW: 81.0000 mg | CHEWABLE_TABLET | Freq: Every day | ORAL | Status: DC

## 2015-03-17 MED ORDER — KETOROLAC TROMETHAMINE 30 MG/ML IJ SOLN
INTRAMUSCULAR | Status: AC
  Administered 2015-03-17: 30 mg via INTRAVENOUS
  Filled 2015-03-17: qty 1

## 2015-03-17 MED ORDER — DIPHENHYDRAMINE HCL 50 MG/ML IJ SOLN
INTRAMUSCULAR | Status: AC
  Filled 2015-03-17: qty 1

## 2015-03-17 NOTE — Progress Notes (Signed)
Patient given discharge instructions, and discharge education completed. Patient states "I don't want to come back with another heart attack". Advised patient to take prescribed nitroglycerin SL for sudden chest pain, and to take scheduled medications as prescribed. Also told patient to follow up with PCP about chronic pain. Patient verbalized understanding. Belongings returned, IV's removed. Patient escorted out of hospital in wheelchair.  Dennison Mascot

## 2015-03-17 NOTE — Progress Notes (Addendum)
     SUBJECTIVE: only c/o headache which is part of his chronic pain process. NO chest pain.   Tele: sinus  BP 111/94 mmHg  Pulse 67  Temp(Src) 97.5 F (36.4 C) (Oral)  Resp 15  Ht 5\' 10"  (1.778 m)  Wt 136 lb 3.9 oz (61.8 kg)  BMI 19.55 kg/m2  SpO2 96%  Intake/Output Summary (Last 24 hours) at 03/17/15 0737 Last data filed at 03/17/15 0002  Gross per 24 hour  Intake  767.8 ml  Output    600 ml  Net  167.8 ml    PHYSICAL EXAM General: Well developed, well nourished, in no acute distress. Alert and oriented x 3.  Psych:  Good affect, responds appropriately Neck: No JVD. No masses noted.  Lungs: Clear bilaterally with no wheezes or rhonci noted.  Heart: RRR with no murmurs noted. Abdomen: Bowel sounds are present. Soft, non-tender.  Extremities: No lower extremity edema.   LABS: Basic Metabolic Panel:  Recent Labs  03/14/15 0923 03/17/15 0400  NA 137 134*  K 4.0 3.6  CL 102 101  CO2 23 22  GLUCOSE 105* 84  BUN 15 9  CREATININE 0.87 0.66  CALCIUM 9.6 9.0   CBC:  Recent Labs  03/14/15 0923 03/16/15 1047 03/17/15 0400  WBC 6.7 6.0 4.7  NEUTROABS 5.0  --   --   HGB 13.8 10.1* 10.9*  HCT 40.1 30.5* 31.6*  MCV 94.1 94.1 93.8  PLT 185 130* 128*   Cardiac Enzymes:  Recent Labs  03/14/15 1938 03/15/15 0121 03/15/15 0559  TROPONINI 0.11* 0.10* 0.08*   Fasting Lipid Panel:  Recent Labs  03/15/15 0558  CHOL 204*  HDL 29*  LDLCALC 149*  TRIG 130  CHOLHDL 7.0    Current Meds: . amLODipine  5 mg Oral Daily  . aspirin  81 mg Oral Daily  . Chlorhexidine Gluconate Cloth  6 each Topical Q0600  . isosorbide mononitrate  60 mg Oral BID  . metoprolol tartrate  25 mg Oral BID  . mupirocin ointment  1 application Nasal BID  . nicotine  14 mg Transdermal Daily  . pantoprazole  40 mg Oral BID  . sertraline  50 mg Oral BID  . sodium chloride flush  3 mL Intravenous Q12H  . ticagrelor  90 mg Oral BID   Cardiac cath 03/16/15: 1. Severe 3 vessel  CAD 2. S/p CABG with continued patency of the LIMA-LAD, SVG-diagonal, and free RIMA-ramus 3. Chronic occlusion of the SVG-PDA and SVG-OM 4. Successful PCI of critical stenosis in the native RCA using a DES platform  ASA/Brilinta x 12 months as tolerated. Should be ok for hospital discharge tomorrow as long as no complications arise.  ASSESSMENT AND PLAN:  1. CAD/NSTEMI/Unstable angina: s/p cath 03/16/15 with severe disease native RCA, 3 patent grafts. DES placed RCA. He is doing well on ASA/Brilinta. Will need DAPT for 12 months. Continue Imdur, beta blocker, statin.    2. Chronic Type B aortic dissection: stable. Follow.   3. HTN: BP controlled. No changes.   4. Tobacco abuse: smoking cessation recommended.   Dispo: D/C home today. Follow up [name].   [name]  2/24/20177:37 AM

## 2015-03-17 NOTE — Progress Notes (Signed)
CARDIAC REHAB PHASE I   PRE:  Rate/Rhythm: 73 SR  BP:  Supine:   Sitting: 145/95  Standing:    SaO2:   MODE:  Ambulation: 200 ft   POST:  Rate/Rhythm: 103 ST  BP:  Supine:   Sitting: 135/94  Standing:    SaO2:  0920-1013 Pt walked 200 ft on RA with rolling walker and minimal asst. No CP but does have constant back pain. MI education done with pt voicing understanding. Discussed brilinta use with stent. Pt needs brilinta card. Pt would like information about any pain clinics near. Will have case manager see re brilinta. Gave heart healthy diet and discussed smoking cessation handout. Pt plans to quit cold [place]. Will refer to [place] [facility] due but pt cannot do at this time due to back issues. Did not give ex ed as pt only walking very short distances due to pain in back. To bed with alarm on.   Graylon Good, RN BSN  03/17/2015 10:08 AM

## 2015-03-17 NOTE — Discharge Summary (Signed)
Discharge Summary    Patient ID: MYRL ANNESE,  MRN: [id number], DOB/AGE: 03/02/1950 65 y.o.  Admit date: 03/14/2015 Discharge date: 03/17/2015  Primary Care Provider: [name] Primary Cardiologist:  [facility]  Discharge Diagnoses    Principal Problem:   Chest pain Active Problems:   Aortic dissection (HCC)   CAD (coronary artery disease)   COPD mixed type (HCC)   Tobacco abuse   Elevated troponin   NSTEMI (non-ST elevated myocardial infarction) (HCC)   Allergies Allergies  Allergen Reactions  . Codeine Other (See Comments)    Causes GI Upset    Diagnostic Studies/Procedures    Procedures    Coronary Stent Intervention   Coronary/Graft Angiography    Conclusion     SVG .  100% occluded  Origin lesion, 100% stenosed.  Ost RCA to Prox RCA lesion, 50% stenosed.  SVG .  This is a sequential graft with an SVG-diagonal and free RIMA that arises from the body of the SVG and goes to the ramus intermedius. The graft is patent throughout  Mid RCA lesion, 30% stenosed.  LIMA .  Patent graft  SVG .  Chronically occluded  Prox Graft lesion, 100% stenosed.  LM lesion, 50% stenosed.  Prox LAD lesion, 100% stenosed.  Prox Cx lesion, 99% stenosed.  Dist RCA lesion, 95% stenosed. Post intervention, there is a 0% residual stenosis.  1. Severe 3 vessel CAD 2. S/p CABG with continued patency of the LIMA-LAD, SVG-diagonal, and free RIMA-ramus 3. Chronic occlusion of the SVG-PDA and SVG-OM 4. Successful PCI of critical stenosis in the native RCA using a DES platform  ASA/Brilinta x 12 months as tolerated. Should be ok for hospital discharge tomorrow as long as no complications arise   Diagnostic Diagram                                                      Post-Intervention Diagram                   _____________   History of Present Illness     This is a 65 year old male patient with history of CAD status post NSTEMI in 07/2007 treated  with emergency CABG. 7 months later he had recurrent chest pain and cardiac catheterization in 2010 revealed severe three-vessel CAD EF 60% mild MR chronic thoracic aortic aneurysm dissection, occluded SVG to the RCA and occluded SVG to the OM, patent SVG to the diagonal and patent free LIMA to the ramus. The disease was too extensive and significant for PCI and medical therapy was recommended. He's had chronic type III aortic dissection followed by Dr. Roxan Hockey, hypertension, COPD and chronic pain.  Patient presents to the emergency room with 3 day history of chest pressure that awakened him from sleep, radiating to left neck associated with shortness of breath worsened by exertion and eased some with NTG and rest. Pain has been pretty constant. Prior to 3 days ago he wasn't having chest pain. 1 month ago he was denied fentanyl and oxycodone that he takes for chronic back pain because he failed a drug test. He's been in withdrawal and having constant back pain which he says makes his chest pain worse. He hasn't been able to walk without getting short of breath. Before that he could walk to mailbox without difficulty. Continues to  smoke 1/2 ppd and wants to leave the floor now despite having current chest pain. Says Morphine doesn't help and has terrible headache from NTG. EKG shows new T-wave inversion inferior laterally and ST elevation in aVR and V1. Troponins have been flat at 0.01, 0.01, 0.11, 0.08. D-dimer was elevated at 3.9 and CT scan showed no evidence of acute PE and chronic type B dissection of the thoracic aorta without opacification of the false lumen although this may be related to early bolus timing for pulmonary artery study.  Hospital Course     Consultants: Cardiac rehabilitation  Patient was admitted to [facility] and subsequent transferred to [facility].  He was continued on Imdur 60 mg daily added a low-dose of amlodipine. Metoprolol was not added because of bradycardia.   2-D echocardiogram revealed an ejection fraction of 50-55% with hypokinesis of the infero-lateral myocardium. Grade 1 diastolic dysfunction.  He underwent cardiac catheterization which revealed severe 3 vessel CAD. S/p CABG with continued patency of the LIMA-LAD, SVG-diagonal, and free RIMA-ramus. Chronic occlusion of the SVG-PDA and SVG-OM. Distal RCA was 95% stenosed and he underwent successful PCI using a DES platform.  We'll continue aspirin and Brilinta for at least 12 months. Smoking cessation was recommended. He was seen by cardiac rehabilitation and ambulated 200 feet with a rolling walker and minimal assistance with no chest pain.  Patient did have a CT scan of the per 20 were first showing that his dissection was stable.  The patient was seen by Dr. Angelena Form who felt he was stable for DC home.   _____________  Discharge Vitals Blood pressure 153/108, pulse 73, temperature 98.5 F (36.9 C), temperature source Oral, resp. rate 15, height 5\' 10"  (1.778 m), weight 136 lb 3.9 oz (61.8 kg), SpO2 98 %.  Filed Weights   03/14/15 0913 03/14/15 1532 03/15/15 2139  Weight: 140 lb (63.504 kg) 140 lb (63.504 kg) 136 lb 3.9 oz (61.8 kg)    Labs & Radiologic Studies     CBC  Recent Labs  03/16/15 1047 03/17/15 0400  WBC 6.0 4.7  HGB 10.1* 10.9*  HCT 30.5* 31.6*  MCV 94.1 93.8  PLT 130* 0000000*   Basic Metabolic Panel  Recent Labs  03/17/15 0400  NA 134*  K 3.6  CL 101  CO2 22  GLUCOSE 84  BUN 9  CREATININE 0.66  CALCIUM 9.0   Liver Function Tests No results for input(s): AST, ALT, ALKPHOS, BILITOT, PROT, ALBUMIN in the last 72 hours. No results for input(s): LIPASE, AMYLASE in the last 72 hours. Cardiac Enzymes  Recent Labs  03/14/15 1938 03/15/15 0121 03/15/15 0559  TROPONINI 0.11* 0.10* 0.08*   BNP Invalid input(s): POCBNP D-Dimer  Recent Labs  03/14/15 1938  DDIMER 3.90*   Hemoglobin A1C No results for input(s): HGBA1C in the last 72 hours. Fasting Lipid  Panel  Recent Labs  03/15/15 0558  CHOL 204*  HDL 29*  LDLCALC 149*  TRIG 130  CHOLHDL 7.0   Thyroid Function Tests No results for input(s): TSH, T4TOTAL, T3FREE, THYROIDAB in the last 72 hours.  Invalid input(s): FREET3  Ct Chest Wo Contrast  03/14/2015  CLINICAL DATA:  66 year old male with chest pain and shortness of breath for 3 days. Nausea. Smoker. Suggestion of acute opacity in the peripheral right lung on portable chest x-ray today. Initial encounter. EXAM: CT CHEST WITHOUT CONTRAST TECHNIQUE: Multidetector CT imaging of the chest was performed following the standard protocol without IV contrast. COMPARISON:  Portable chest  radiograph 0932 hours. Chest CTA 12/17/2012. FINDINGS: Minimal non dependent retained secretions in the trachea. Otherwise the major airways are patent. Pulmonary hyperinflation. Mild scarring in the lingula and left lower lobe. No confluent opacity in the right lung to correspond to the earlier radiographic appearance. No pleural effusion. No pulmonary nodule identified. Chronic enlargement of the thoracic aorta, seen to be associated with type B dissection in 2014. Stable enlarged descending aorta caliber of 41 mm, while the distal arch caliber of 46 mm appears mildly increased since 2014 (44 mm at that time). Stable sequelae of CABG. No pericardial effusion. No mediastinal or hilar lymphadenopathy is evident. No axillary lymphadenopathy. Stable visualized upper abdominal viscera. The chronic dissection flap is partially calcified in the visualized upper abdominal. Aorta No acute osseous abnormality identified. IMPRESSION: 1. Chronic pulmonary hyperinflation with no acute pulmonary findings. No right lung opacity to correspond to the earlier radiographic appearance which must have been artifact. 2. Chronic aortic dissection. No evidence of rupture in the chest on this noncontrast study. The dissection was Stanford type B in 2014. Abnormally enlarged thoracic aortic  caliber is stable to slightly increased since that time. Electronically Signed   By: Genevie Ann M.D.   On: 03/14/2015 11:30   Ct Angio Chest Pe W/cm &/or Wo Cm  03/14/2015  CLINICAL DATA:  Chest pain with elevated D-dimer. EXAM: CT ANGIOGRAPHY CHEST WITH CONTRAST TECHNIQUE: Multidetector CT imaging of the chest was performed using the standard protocol during bolus administration of intravenous contrast. Multiplanar CT image reconstructions and MIPs were obtained to evaluate the vascular anatomy. CONTRAST:  150mL OMNIPAQUE IOHEXOL 350 MG/ML SOLN COMPARISON:  Chest CT without contrast performed earlier the same day. 12/17/2012 FINDINGS: Mediastinum / Lymph Nodes: There is no axillary lymphadenopathy. No mediastinal lymphadenopathy. There is no hilar lymphadenopathy. The heart size is normal. No pericardial effusion. Patient is status post CABG. No filling defects within the opacified pulmonary arteries to suggest the presence of an acute pulmonary embolus. As seen on the study from 2014, patient has a tight be dissection of the thoracic aorta. In 2014, the false lumen showed differential perfusion, but on today's study there is no evidence for enhancement of the false lumen although this study is timed for the pulmonary arteries and there is only minimal contrast entering the ascending aorta at the time of image acquisition. Lungs / Pleura: Hyperexpansion is consistent with emphysema. No pulmonary edema or pleural effusion. No focal airspace consolidation. Subsegmental atelectasis or linear scarring is seen in the lingula and left lower lobe. Upper Abdomen:  Unremarkable. MSK / Soft Tissues: Bone windows reveal no worrisome lytic or sclerotic osseous lesions. Review of the MIP images confirms the above findings. IMPRESSION: 1. No CT evidence for acute pulmonary embolus. 2. Chronic type B dissection of the thoracic aorta again noted without opacification of the false lumen on today's study although this may be in part  related to early bolus timing for the pulmonary arterial study. 3. Emphysema without pulmonary edema or evidence of focal pneumonia. Electronically Signed   By: Misty Stanley M.D.   On: 03/14/2015 21:51   Dg Chest Portable 1 View  03/14/2015  CLINICAL DATA:  65 year old male with chest pain and shortness of breath for 3 days. Initial encounter. Smoker EXAM: PORTABLE CHEST 1 VIEW COMPARISON:  02/04/2014 and earlier. FINDINGS: Portable AP upright view at 0932 hours. Interval removal right PICC line. Multiple EKG leads overlie the chest. Sequelae of CABG. Tortuous thoracic aorta appears stable. Other mediastinal contours  are within normal limits. No pneumothorax, pulmonary edema or pleural effusion. Lung bases appear stable. There is suggestion of new patchy opacity in the peripheral right upper lobe (arrow). No other acute pulmonary opacity. IMPRESSION: 1. Suggestion of small area of acute patchy opacity in the peripheral right upper lobe, nonspecific but consider acute infectious exacerbation. 2. No associated pleural effusion and otherwise stable chest. Electronically Signed   By: Genevie Ann M.D.   On: 03/14/2015 09:48    Disposition   Pt is being discharged home today in good condition.  Follow-up Plans & Appointments    Follow-up Information    Follow up with Candee Furbish, MD On 04/04/2015.   Specialty:  Cardiology   Why:  9:00 AM   Contact information:   [id number] [place] [address] [phone]      Discharge Instructions    Amb Referral to Cardiac Rehabilitation    Complete by:  As directed   Diagnosis:   Myocardial Infarction Comment - cannot do due to back issues PCI       Diet - low sodium heart healthy    Complete by:  As directed      Increase activity slowly    Complete by:  As directed            Discharge Medications   Current Discharge Medication List    START taking these medications   Details  amLODipine (NORVASC) 5 MG tablet Take 1  tablet (5 mg total) by mouth daily. Qty: 30 tablet, Refills: 11    aspirin 81 MG chewable tablet Chew 1 tablet (81 mg total) by mouth daily. Qty: 30 tablet    atorvastatin (LIPITOR) 80 MG tablet Take 1 tablet (80 mg total) by mouth daily at 6 PM. Qty: 30 tablet, Refills: 11    nicotine (NICODERM CQ - DOSED IN MG/24 HOURS) 14 mg/24hr patch Place 1 patch (14 mg total) onto the skin daily. Qty: 28 patch, Refills: 0    ticagrelor (BRILINTA) 90 MG TABS tablet Take 1 tablet (90 mg total) by mouth 2 (two) times daily. Qty: 60 tablet, Refills: 11      CONTINUE these medications which have NOT CHANGED   Details  isosorbide mononitrate (IMDUR) 60 MG 24 hr tablet TAKE ONE TABLET BY MOUTH TWICE DAILY Qty: 60 tablet, Refills: 5    metoprolol tartrate (LOPRESSOR) 25 MG tablet TAKE ONE TABLET BY MOUTH TWICE DAILY Qty: 180 tablet, Refills: 3    nitroGLYCERIN (NITROSTAT) 0.4 MG SL tablet Place 0.4 mg under the tongue every 5 (five) minutes as needed. Chest pain    pantoprazole (PROTONIX) 40 MG tablet Take 40 mg by mouth 2 (two) times daily.     PROAIR HFA 108 (90 BASE) MCG/ACT inhaler Take 2 puffs by mouth every 6 (six) hours as needed for wheezing. Lung disease    sertraline (ZOLOFT) 50 MG tablet Take 50 mg by mouth 2 (two) times daily.    cyclobenzaprine (FLEXERIL) 10 MG tablet Take 1 tablet (10 mg total) by mouth 2 (two) times daily as needed for muscle spasms. Qty: 20 tablet, Refills: 0      STOP taking these medications     predniSONE (STERAPRED UNI-PAK 21 TAB) 10 MG (21) TBPK tablet          Aspirin prescribed at discharge?  Yes High Intensity Statin Prescribed? (Lipitor 40-80mg  or Crestor 20-40mg ): Yes Beta Blocker Prescribed? Yes For EF 45% or less, Was ACEI/ARB Prescribed? NA ADP Receptor  Inhibitor Prescribed? (i.e. Plavix etc.-Includes Medically Managed Patients): Yes For EF <40%, Aldosterone Inhibitor Prescribed? NA Was EF assessed during THIS hospitalization? Yes Was  Cardiac Rehab II ordered? (Included Medically managed Patients): Yes   Outstanding Labs/Studies     Duration of Discharge Encounter   Greater than 30 minutes including physician time.  Patriciaann Clan, Janie Capp PAC 03/17/2015, 10:46 AM

## 2015-03-17 NOTE — Care Management Note (Signed)
Case Management Note  Patient Details  Name: [name] MRN: [id number] Date of Birth: 01/23/1950  Subjective/Objective:                    Action/Plan: Met with patient to provide the Brilinta 30 day free card. Patient expressed concerns over his chronic pain, which he feels his PCP is not addressing.  He is requesting pain medication prescriptions at discharge.  CM spoke with bedside RN, who was already aware of patient's request.  No further discharge needs identified at this time.  Expected Discharge Date:                  Expected Discharge Plan:  Home/Self Care  In-House Referral:     Discharge planning Services  CM Consult, Medication Assistance  Post Acute Care Choice:    Choice offered to:     DME Arranged:    DME Agency:     HH Arranged:    [place] Agency:     Status of Service:  Completed, signed off  Medicare Important Message Given:    Date Medicare IM Given:    Medicare IM give by:    Date Additional Medicare IM Given:    Additional Medicare Important Message give by:     If discussed at [place] of Stay Meetings, dates discussed:    Additional Comments:  Rolm Baptise, RN 03/17/2015, 11:55 AM [phone]

## 2015-03-17 NOTE — Progress Notes (Signed)
Pt continues to complain about 10/10 headache. PRN medications not effective. [facility] MD notified and PRN ordered for pain. BP 11/94 HR 60s O2 100% RA. Will continue to monitor pt.

## 2015-03-23 ENCOUNTER — Emergency Department (HOSPITAL_COMMUNITY): Payer: Medicare PPO

## 2015-03-23 ENCOUNTER — Inpatient Hospital Stay (HOSPITAL_COMMUNITY)
Admission: EM | Admit: 2015-03-23 | Discharge: 2015-03-24 | DRG: 313 | Discharge status: Against Medical Advice | Payer: Medicare PPO | Attending: Internal Medicine | Admitting: Internal Medicine

## 2015-03-23 ENCOUNTER — Encounter (HOSPITAL_COMMUNITY): Payer: Self-pay | Admitting: *Deleted

## 2015-03-23 DIAGNOSIS — R778 Other specified abnormalities of plasma proteins: Secondary | ICD-10-CM | POA: Diagnosis present

## 2015-03-23 DIAGNOSIS — R64 Cachexia: Secondary | ICD-10-CM | POA: Diagnosis not present

## 2015-03-23 DIAGNOSIS — M549 Dorsalgia, unspecified: Secondary | ICD-10-CM

## 2015-03-23 DIAGNOSIS — I7100 Dissection of unspecified site of aorta: Secondary | ICD-10-CM | POA: Diagnosis not present

## 2015-03-23 DIAGNOSIS — M545 Low back pain: Secondary | ICD-10-CM | POA: Diagnosis present

## 2015-03-23 DIAGNOSIS — F329 Major depressive disorder, single episode, unspecified: Secondary | ICD-10-CM | POA: Diagnosis present

## 2015-03-23 DIAGNOSIS — G8929 Other chronic pain: Secondary | ICD-10-CM

## 2015-03-23 DIAGNOSIS — R0789 Other chest pain: Secondary | ICD-10-CM | POA: Diagnosis not present

## 2015-03-23 DIAGNOSIS — R55 Syncope and collapse: Secondary | ICD-10-CM | POA: Insufficient documentation

## 2015-03-23 DIAGNOSIS — Z681 Body mass index (BMI) 19 or less, adult: Secondary | ICD-10-CM

## 2015-03-23 DIAGNOSIS — J449 Chronic obstructive pulmonary disease, unspecified: Secondary | ICD-10-CM

## 2015-03-23 DIAGNOSIS — R7989 Other specified abnormal findings of blood chemistry: Secondary | ICD-10-CM | POA: Diagnosis not present

## 2015-03-23 DIAGNOSIS — I10 Essential (primary) hypertension: Secondary | ICD-10-CM | POA: Diagnosis present

## 2015-03-23 DIAGNOSIS — F32A Depression, unspecified: Secondary | ICD-10-CM | POA: Diagnosis present

## 2015-03-23 DIAGNOSIS — I252 Old myocardial infarction: Secondary | ICD-10-CM | POA: Diagnosis not present

## 2015-03-23 DIAGNOSIS — E7800 Pure hypercholesterolemia, unspecified: Secondary | ICD-10-CM | POA: Diagnosis present

## 2015-03-23 DIAGNOSIS — Z951 Presence of aortocoronary bypass graft: Secondary | ICD-10-CM | POA: Diagnosis not present

## 2015-03-23 DIAGNOSIS — Z955 Presence of coronary angioplasty implant and graft: Secondary | ICD-10-CM | POA: Diagnosis not present

## 2015-03-23 DIAGNOSIS — I2510 Atherosclerotic heart disease of native coronary artery without angina pectoris: Secondary | ICD-10-CM | POA: Diagnosis not present

## 2015-03-23 DIAGNOSIS — E876 Hypokalemia: Secondary | ICD-10-CM

## 2015-03-23 DIAGNOSIS — Z7982 Long term (current) use of aspirin: Secondary | ICD-10-CM | POA: Diagnosis not present

## 2015-03-23 DIAGNOSIS — I209 Angina pectoris, unspecified: Secondary | ICD-10-CM | POA: Diagnosis present

## 2015-03-23 DIAGNOSIS — R748 Abnormal levels of other serum enzymes: Secondary | ICD-10-CM | POA: Diagnosis present

## 2015-03-23 DIAGNOSIS — F17210 Nicotine dependence, cigarettes, uncomplicated: Secondary | ICD-10-CM | POA: Diagnosis present

## 2015-03-23 DIAGNOSIS — R079 Chest pain, unspecified: Secondary | ICD-10-CM | POA: Diagnosis not present

## 2015-03-23 DIAGNOSIS — G894 Chronic pain syndrome: Secondary | ICD-10-CM | POA: Diagnosis not present

## 2015-03-23 DIAGNOSIS — I25810 Atherosclerosis of coronary artery bypass graft(s) without angina pectoris: Secondary | ICD-10-CM | POA: Diagnosis present

## 2015-03-23 LAB — COMPREHENSIVE METABOLIC PANEL
ALBUMIN: 3.3 g/dL — AB (ref 3.5–5.0)
ALT: 7 U/L — ABNORMAL LOW (ref 17–63)
ANION GAP: 9 (ref 5–15)
AST: 14 U/L — AB (ref 15–41)
Alkaline Phosphatase: 36 U/L — ABNORMAL LOW (ref 38–126)
BILIRUBIN TOTAL: 0.3 mg/dL (ref 0.3–1.2)
BUN: 9 mg/dL (ref 6–20)
CALCIUM: 8.3 mg/dL — AB (ref 8.9–10.3)
CHLORIDE: 100 mmol/L — AB (ref 101–111)
CO2: 25 mmol/L (ref 22–32)
CREATININE: 0.68 mg/dL (ref 0.61–1.24)
GFR calc Af Amer: >60 mL/min (ref >60)
GFR calc non Af Amer: >60 mL/min (ref >60)
GLUCOSE: 93 mg/dL (ref 65–99)
POTASSIUM: 2.9 mmol/L — AB (ref 3.5–5.1)
SODIUM: 134 mmol/L — AB (ref 135–145)
TOTAL PROTEIN: 6.4 g/dL — AB (ref 6.5–8.1)

## 2015-03-23 LAB — CBC
HCT: 33.5 % — ABNORMAL LOW (ref 39.0–52.0)
HEMOGLOBIN: 11.6 g/dL — AB (ref 13.0–17.0)
MCH: 32.9 pg (ref 26.0–34.0)
MCHC: 34.6 g/dL (ref 30.0–36.0)
MCV: 94.9 fL (ref 78.0–100.0)
PLATELETS: 164 10*3/uL (ref 150–400)
RBC: 3.53 MIL/uL — AB (ref 4.22–5.81)
RDW: 14.3 % (ref 11.5–15.5)
WBC: 6.2 10*3/uL (ref 4.0–10.5)

## 2015-03-23 LAB — URINALYSIS, ROUTINE W REFLEX MICROSCOPIC
Bilirubin Urine: NEGATIVE
Glucose, UA: NEGATIVE mg/dL
Ketones, ur: NEGATIVE mg/dL
Leukocytes, UA: NEGATIVE
Nitrite: NEGATIVE
Protein, ur: NEGATIVE mg/dL
SPECIFIC GRAVITY, URINE: 1.01 (ref 1.005–1.030)
pH: 5.5 (ref 5.0–8.0)

## 2015-03-23 LAB — RAPID URINE DRUG SCREEN, HOSP PERFORMED
Amphetamines: NOT DETECTED
BENZODIAZEPINES: NOT DETECTED
Barbiturates: NOT DETECTED
COCAINE: NOT DETECTED
OPIATES: POSITIVE — AB
Tetrahydrocannabinol: NOT DETECTED

## 2015-03-23 LAB — PROTIME-INR
INR: 1.29 (ref 0.00–1.49)
PROTHROMBIN TIME: 16.3 s — AB (ref 11.6–15.2)

## 2015-03-23 LAB — URINE MICROSCOPIC-ADD ON: BACTERIA UA: NONE SEEN

## 2015-03-23 LAB — TROPONIN I: TROPONIN I: 0.3 ng/mL — AB (ref <0.031)

## 2015-03-23 LAB — MAGNESIUM: MAGNESIUM: 1.6 mg/dL — AB (ref 1.7–2.4)

## 2015-03-23 MED ORDER — ASPIRIN 81 MG PO CHEW
324.0000 mg | CHEWABLE_TABLET | Freq: Once | ORAL | Status: AC
  Administered 2015-03-23: 243 mg via ORAL
  Filled 2015-03-23: qty 4

## 2015-03-23 MED ORDER — SODIUM CHLORIDE 0.9 % IV SOLN
INTRAVENOUS | Status: DC
  Administered 2015-03-23 – 2015-03-24 (×2): via INTRAVENOUS

## 2015-03-23 MED ORDER — POTASSIUM CHLORIDE 10 MEQ/100ML IV SOLN
10.0000 meq | INTRAVENOUS | Status: AC
  Administered 2015-03-23 (×4): 10 meq via INTRAVENOUS
  Filled 2015-03-23 (×4): qty 100

## 2015-03-23 MED ORDER — IOHEXOL 350 MG/ML SOLN
100.0000 mL | Freq: Once | INTRAVENOUS | Status: AC | PRN
  Administered 2015-03-23: 100 mL via INTRAVENOUS

## 2015-03-23 MED ORDER — HYDRALAZINE HCL 20 MG/ML IJ SOLN
10.0000 mg | INTRAMUSCULAR | Status: AC
  Administered 2015-03-23: 10 mg via INTRAVENOUS
  Filled 2015-03-23: qty 1

## 2015-03-23 MED ORDER — SODIUM CHLORIDE 0.9 % IV BOLUS (SEPSIS)
1000.0000 mL | Freq: Once | INTRAVENOUS | Status: AC
  Administered 2015-03-23: 1000 mL via INTRAVENOUS

## 2015-03-23 MED ORDER — NALOXONE HCL 0.4 MG/ML IJ SOLN
INTRAMUSCULAR | Status: AC
  Filled 2015-03-23: qty 1

## 2015-03-23 MED ORDER — NALOXONE HCL 0.4 MG/ML IJ SOLN
0.4000 mg | Freq: Once | INTRAMUSCULAR | Status: AC
  Administered 2015-03-23: 0.4 mg via INTRAVENOUS
  Filled 2015-03-23: qty 1

## 2015-03-23 MED ORDER — FENTANYL CITRATE (PF) 100 MCG/2ML IJ SOLN
25.0000 ug | Freq: Once | INTRAMUSCULAR | Status: AC
  Administered 2015-03-23: 25 ug via INTRAVENOUS
  Filled 2015-03-23: qty 2

## 2015-03-23 MED ORDER — MAGNESIUM SULFATE 2 GM/50ML IV SOLN
2.0000 g | INTRAVENOUS | Status: AC
  Administered 2015-03-23: 2 g via INTRAVENOUS
  Filled 2015-03-23: qty 50

## 2015-03-23 NOTE — ED Provider Notes (Signed)
CSN: [id number]     Arrival date & time    History   First MD Initiated Contact with Patient 03/23/15 1837     Chief Complaint  Patient presents with  . Loss of Consciousness     (Consider location/radiation/quality/duration/timing/severity/associated sxs/prior Treatment) HPI Patient presents via EMS providers after being found unresponsive. EMS reports that the patient awakened with ammonia inhalant. Patient subsequently complained of pain throughout the chest. On my initial exam the patient is minimally awake, groaning, complaining of chest pain. He cannot provide additional details of when this episode began, nor characteristics of the pain. The pain is seemingly severe. Patient cannot specify if he has been taking all medication as directed. EMS reports that the patient has notable history of recent admission to our affiliated cardiac care Center, and there had a coronary catheterization with placement of stent.  Past Medical History  Diagnosis Date  . Cardiac tamponade   . Aortic dissection (HCC)     TYPE 3  . HTN (hypertension)   . CAD (coronary artery disease)   . Hypercholesterolemia   . Chronic back pain   . Hiatal hernia   . Schatzki's ring   . Chronic fatigue   . Chronic pain   . Peptic ulcer disease 08/2010    EGD   . COPD (chronic obstructive pulmonary disease) ([place])   . Chronic bronchitis [facility])    Past Surgical History  Procedure Laterality Date  . Sternal wire removeal  12/31/2007    [facility]  . Cabg x 6  07/31/2007    [facility]  . Cardiac catheterization    . Knee surgery Right     acl  . Shoulder surgery Left   . Orif scapular fracture Right   . Back surgery    . Olecranon bursectomy Left 08/27/2012    Procedure: EXCISION LEFT OLECRANON BURSA;  Surgeon: Sanjuana Kava, MD;  Location: [facility];  Service: Orthopedics;  Laterality: Left;  . Olecranon bursectomy Left 05/25/2013    Procedure:  Excision sinus tract and tissue left elbow ;  Surgeon: Sanjuana Kava, MD;  Location: [facility];  Service: Orthopedics;  Laterality: Left;  . Cardiac catheterization N/A 03/16/2015    Procedure: Coronary/Graft Angiography;  Surgeon: Sherren Mocha, MD;  Location: [facility];  Service: Cardiovascular;  Laterality: N/A;  . Cardiac catheterization N/A 03/16/2015    Procedure: Coronary Stent Intervention;  Surgeon: Sherren Mocha, MD;  Location: [facility];  Service: Cardiovascular;  Laterality: N/A;   Family History  Problem Relation Age of Onset  . Heart disease Mother   . Heart disease Father    Social History  Substance Use Topics  . Smoking status: Current Some Day Smoker -- 0.50 packs/day for 50 years    Types: Cigarettes  . Smokeless tobacco: Never Used  . Alcohol Use: No    Review of Systems  Unable to perform ROS: Acuity of condition      Allergies  Codeine  Home Medications   Prior to Admission medications   Medication Sig Start Date End Date Taking? Authorizing Provider  amLODipine (NORVASC) 5 MG tablet Take 1 tablet (5 mg total) by mouth daily. 03/17/15  Yes Brett Canales, PA-C  aspirin 81 MG chewable tablet Chew 1 tablet (81 mg total) by mouth daily. 03/17/15  Yes Brett Canales, PA-C  atorvastatin (LIPITOR) 80 MG tablet Take 1 tablet (80 mg total) by mouth daily at 6 PM. 03/17/15  Yes Brett Canales, PA-C  isosorbide  mononitrate (IMDUR) 60 MG 24 hr tablet TAKE ONE TABLET BY MOUTH TWICE DAILY 05/16/14  Yes Jerline Pain, MD  metoprolol tartrate (LOPRESSOR) 25 MG tablet TAKE ONE TABLET BY MOUTH TWICE DAILY 02/27/15  Yes Jerline Pain, MD  pantoprazole (PROTONIX) 40 MG tablet Take 40 mg by mouth 2 (two) times daily.  08/08/10  Yes Historical Provider, MD  sertraline (ZOLOFT) 50 MG tablet Take 50 mg by mouth 2 (two) times daily.   Yes Historical Provider, MD  ticagrelor (BRILINTA) 90 MG TABS tablet Take 1 tablet (90 mg total) by mouth 2 (two) times daily. 03/17/15  Yes Brett Canales, PA-C  cyclobenzaprine (FLEXERIL) 10 MG tablet  Take 1 tablet (10 mg total) by mouth 2 (two) times daily as needed for muscle spasms. Patient not taking: Reported on 03/14/2015 12/18/14   Samantha Tripp Dowless, PA-C  nicotine (NICODERM CQ - DOSED IN MG/24 HOURS) 14 mg/24hr patch Place 1 patch (14 mg total) onto the skin daily. Patient not taking: Reported on 03/23/2015 03/17/15   Brett Canales, PA-C  nitroGLYCERIN (NITROSTAT) 0.4 MG SL tablet Place 0.4 mg under the tongue every 5 (five) minutes as needed. Chest pain    Historical Provider, MD  PROAIR HFA 108 (90 BASE) MCG/ACT inhaler Take 2 puffs by mouth every 6 (six) hours as needed for wheezing. Lung disease 03/10/14   Historical Provider, MD   BP 173/102 mmHg  Pulse 66  Temp(Src) 98.3 F (36.8 C) (Oral)  Resp 19  Ht 5\' 7"  (1.702 m)  Wt 135 lb (61.236 kg)  BMI 21.14 kg/m2  SpO2 100% Physical Exam  Constitutional: He is oriented to person, place, and time. He has a sickly appearance.  HENT:  Head: Normocephalic and atraumatic.  Eyes: Conjunctivae and EOM are normal.  Cardiovascular: Normal rate and regular rhythm.   Pulmonary/Chest: Effort normal. No stridor. No respiratory distress.    Abdominal: He exhibits no distension.  Musculoskeletal: He exhibits no edema.  Neurological: He is oriented to person, place, and time.  No gross deficits, though the patient is slow to respond speech, stimuli. He moves all extremity spontaneously.   Skin: Skin is warm and dry.  Psychiatric: He is slowed and withdrawn. Cognition and memory are impaired.  Nursing note and vitals reviewed.   ED Course  Procedures (including critical care time) Labs Review Labs Reviewed  CBC - Abnormal; Notable for the following:    RBC 3.53 (*)    Hemoglobin 11.6 (*)    HCT 33.5 (*)    All other components within normal limits  COMPREHENSIVE METABOLIC PANEL - Abnormal; Notable for the following:    Sodium 134 (*)    Potassium 2.9 (*)    Chloride 100 (*)    Calcium 8.3 (*)    Total Protein 6.4 (*)     Albumin 3.3 (*)    AST 14 (*)    ALT 7 (*)    Alkaline Phosphatase 36 (*)    All other components within normal limits  MAGNESIUM - Abnormal; Notable for the following:    Magnesium 1.6 (*)    All other components within normal limits  PROTIME-INR - Abnormal; Notable for the following:    Prothrombin Time 16.3 (*)    All other components within normal limits  TROPONIN I - Abnormal; Notable for the following:    Troponin I 0.30 (*)    All other components within normal limits  URINE RAPID DRUG SCREEN, HOSP PERFORMED - Abnormal; Notable for  the following:    Opiates POSITIVE (*)    All other components within normal limits  URINALYSIS, ROUTINE W REFLEX MICROSCOPIC (NOT AT [facility]) - Abnormal; Notable for the following:    Hgb urine dipstick TRACE (*)    All other components within normal limits  URINE MICROSCOPIC-ADD ON - Abnormal; Notable for the following:    Squamous Epithelial / LPF 0-5 (*)    All other components within normal limits    Imaging Review Dg Chest 2 View  03/23/2015  CLINICAL DATA:  Patient found unresponsive in bathroom at home by wife, ammonia inhalant used by EMS and pt came around, pupils sluggish, NSR, pt reports left sided chest pain and back pain Current smoker, CAD, HTN, COPD EXAM: CHEST  2 VIEW COMPARISON:  Chest CT, 03/14/2015 FINDINGS: Cardiac silhouette is normal in size and configuration. Aorta is dilated. Patient has a known dissection. No mediastinal or hilar masses or evidence of adenopathy. Lungs are hyperexpanded but clear. No pleural effusion or pneumothorax. Bony thorax is intact. IMPRESSION: No acute cardiopulmonary disease. Electronically Signed   By: Lajean Manes M.D.   On: 03/23/2015 19:49   Ct Angio Chest Aorta W/cm &/or Wo/cm  03/23/2015  CLINICAL DATA:  Syncope. Found passed out on bathroom floor. Assess patient's known aortic dissection. Initial encounter. EXAM: CT ANGIOGRAPHY CHEST WITH CONTRAST TECHNIQUE: Multidetector CT imaging of the chest  was performed using the standard protocol during bolus administration of intravenous contrast. Multiplanar CT image reconstructions and MIPs were obtained to evaluate the vascular anatomy. CONTRAST:  115mL OMNIPAQUE IOHEXOL 350 MG/ML SOLN COMPARISON:  CTA of the chest performed 03/14/2015 FINDINGS: The patient's chronic Stanford type B dissection of the descending thoracic aorta is grossly unchanged in appearance, with a fenestration at the superior aspect of the dissection. It measures up to 4.3 cm in diameter at the proximal descending thoracic aorta, 3.9 cm in transverse dimension at level of the diaphragm, and 3.8 cm in AP dimension just inferior to the level of the renal arteries. There is improved opacification of the false lumen compared to the prior study, reflecting the phase of contrast enhancement, though it does have less flow than the true lumen. There is partial thrombosis of the false lumen beginning at the mid descending thoracic aorta, with complete occlusion just below the level of the celiac trunk. The dissection arises just distal to the origin of the left subclavian artery. The dissection flap extends into the celiac trunk and common hepatic artery, though the splenic artery appears to be supplied by a somewhat compressed true lumen, and a true lumen is also seen extending into the common hepatic artery. The dissection along the common hepatic artery is not well defined. The false lumen occludes below this level, and the superior mesenteric artery and bilateral renal arteries arise from the true lumen. Note that the proximal left renal artery is compressed as it passes through the thrombosed false lumen. There is no evidence of central pulmonary embolus. Minimal bibasilar atelectasis or scarring is noted. The lungs are otherwise clear. There is no evidence of significant focal consolidation, pleural effusion or pneumothorax. No masses are identified; no abnormal focal contrast enhancement is  seen. Postoperative change is noted about the mediastinum. The patient is status post median sternotomy, with subsequent sternal wire removal. No mediastinal lymphadenopathy is seen. No pericardial effusion is identified. The great vessels are grossly unremarkable in appearance. The thyroid gland is unremarkable. No axillary lymphadenopathy is seen. The visualized portions of the liver  and spleen are unremarkable. The visualized portions of the pancreas, stomach, adrenal glands and left kidney are within normal limits. No acute osseous abnormalities are seen. Review of the MIP images confirms the above findings. IMPRESSION: 1. Stable appearance to chronic Stanford type B dissection of the descending thoracic aorta, with fenestration at the superior aspect of the dissection. It measures up to 4.3 cm diameter at the proximal descending thoracic aorta, 3.9 cm in transverse dimension at the level of the diaphragm, and 3.8 cm in AP dimension just inferior to the level of the renal arteries. Partial thrombosis of the false lumen beginning at the mid descending thoracic aorta, with complete occlusion just below the level of the celiac trunk. 2. Dissection flap extends into the celiac trunk and common hepatic artery, though the splenic artery appears to be supplied by a somewhat compressed true lumen. The dissection along the common hepatic artery is not well defined. The proximal left renal artery, though supplied by the true lumen, is compressed as it passes through the thrombosed false lumen. 3. No evidence of central pulmonary embolus. 4. Minimal bibasilar atelectasis or scarring noted. Lungs otherwise clear. Electronically Signed   By: Garald Balding M.D.   On: 03/23/2015 22:22   I have personally reviewed and evaluated these images and lab results as part of my medical decision-making.   EKG Interpretation   Date/Time:  Thursday March 23 2015 18:44:45 EST Ventricular Rate:  80 PR Interval:  172 QRS Duration:  98 QT Interval:  363 QTC Calculation: 419 R Axis:   92 Text Interpretation:  Sinus arrhythmia Right axis deviation RSR' in V1 or  V2, probably normal variant Borderline repolarization abnormality Sinus  arrhythmia T wave abnormality No significant change since last tracing  Abnormal ekg Confirmed by Carmin Muskrat  MD [id number]) on 03/23/2015 6:49:45  PM     EMS 12-lead rhythm strip had sinus rhythm, rate 65  EMR notable for recent coronary catheterization, as well as history of multiple prior cardiac surgery.   Initial troponin 0.3. This is elevated compared to prior, however there is no value from the patient's post catheterization.,  Prior to discharge. Patient also found to have hypomagnesemia, hypokalemia. Patient has received fluids, will receive repletion of magnesium, potassium.  On repeat exam the patient appears in similar condition, continued back pain, seemingly radiating anteriorly. I discussed patient's case with our cardiology colleagues (Dr. Eula Fried), and given the elevated troponin, syncope, his history of known aortic dissection, CT angiography will be performed.   Update:, Patient remains in similar condition.  Update: Patient had some her condition. He and wife are aware of need to transfer to our affiliated cardiac center given his elevated troponin, cardiac history, recurrent chest pain.  MDM  Patient with multiple medical issues, including chronic pain, multiple cardiac procedures, including stent placement one week ago now presents with chest pain, following an episode of syncope. Here, the patient is awake, alert, with no discernible asymmetry of circulation. However, given the patient's history, CT scan performed to exclude dissection progression. Patient required transfer to our cardiac center.  CRITICAL CARE Performed by: Carmin Muskrat Total critical care time: 45 minutes Critical care time was exclusive of separately billable procedures and treating  other patients. Critical care was necessary to treat or prevent imminent or life-threatening deterioration. Critical care was time spent personally by me on the following activities: development of treatment plan with patient and/or surrogate as well as nursing, discussions with consultants, evaluation of patient's response to treatment,  examination of patient, obtaining history from patient or surrogate, ordering and performing treatments and interventions, ordering and review of laboratory studies, ordering and review of radiographic studies, pulse oximetry and re-evaluation of patient's condition.    Carmin Muskrat, MD 03/23/15 2225

## 2015-03-23 NOTE — ED Notes (Signed)
Patient BP elevated.

## 2015-03-23 NOTE — ED Notes (Signed)
Patient found unresponsive in bathroom at home by wife, ammonia inhalant used by EMS and pt came around, pupils sluggish, NSR, pt reported chest pain and back pain, given one SL NTG. Pt. Alert now, reports chest pain 7/10.

## 2015-03-23 NOTE — ED Notes (Signed)
PT much more alert.  Asking for blanket, able to sit up and urinate on own

## 2015-03-23 NOTE — ED Notes (Signed)
Pt requesting information on behavioral health and detox from pain medications

## 2015-03-23 NOTE — H&P (Signed)
CARDIOLOGY INPATIENT HISTORY AND PHYSICAL EXAMINATION NOTE  Patient ID: [name] MRN: [id number], DOB/AGE: 1950/05/01   Admit date: 03/23/2015   Primary Physician: [name], MD Primary Cardiologist: Candee Furbish MD  Reason for admission: Chest pain and syncope  HPI: This is a 65 y.o. male with known history of NSTEMI 07/2007 s/p emergency CABG and recent PCI of native dRCA 95% with DES (occluded SVG to OM, occluded SVG to PDA,  Patent SVG to D1, free LIMA to ramus) for angina CCS IV, type B aortic dissection, hypertension, COPD, hiatal hernia, PUD who and chronic pain syndrome presented with chest pain and loss of conciousness.  Patient was recently admitted for chest pain at rest on 03/15/2015 and underwent cardiac catheterization. Successful PTCA of dRCA 95% stenosis was performed at that time for angina CCS IV. His troponin was 0.08 prior to cath and isnt available post cath however today it is 0.3. He was chest pain free at the time of discharge on 03/16/2015 and was able to ambulate 200 feet. Today he presented to [facility] with loss of consciousness and chest pain. His main concern is his left arm pain and back pain at the moment. He said that since he left hospital, he has not been very active at home and has been sitting or laying for most of the day due to back pain. Yesterday he started having back pain and whenever he has back pain, he develops angina. He is describing his chest pain present in the left precordial region, which is a dollar coin sized area on the top of 3rd and 4th intercostal spaces, does not radiate and gets worsens with cough and deep breathing. He is also complaining of a cough which is going on from yesterday. He said that he does not remember the last moment at home but he woke up today at the [facility] ED where he underwent CTA which showed stable aortic dissection without any new changes. It was suspected that the syncope might be related to opioid  overdose, however patient said that he Patient was denied fentanyl and oxycodone last month due to failing his drug test. He has been taking opioids since he left this time.  Patient continues to smoke. He said that he did not take any opioids at home but his urine drug screen was positive. In the past, he was treated with fentanyl and oxycodone but then he had "xanax positive in his blood" that he took from his wife since he did not have enough pain medications and he wanted to take medications for his back pain. He does not have any suicidal/homicidal thoughts at the moment. According to him, since his drug test was positive, he was not prescribed more pain medications. He denied buying or selling pain medications off the market. He denied any drug abuse.   Per patient he is compliant with the medications and has been taking brilinta everyday.   EKG today showed normal sinus rhythm with normal axis, RSR' pattern, T wave inversion in the lateral leads  Problem List: Past Medical History  Diagnosis Date  . Cardiac tamponade   . Aortic dissection (HCC)     TYPE 3  . HTN (hypertension)   . CAD (coronary artery disease)   . Hypercholesterolemia   . Chronic back pain   . Hiatal hernia   . Schatzki's ring   . Chronic fatigue   . Chronic pain   . Peptic ulcer disease 08/2010    EGD   .  COPD (chronic obstructive pulmonary disease) ([place])   . Chronic bronchitis [facility])     Past Surgical History  Procedure Laterality Date  . Sternal wire removeal  12/31/2007    [facility]  . Cabg x 6  07/31/2007    [facility]  . Cardiac catheterization    . Knee surgery Right     acl  . Shoulder surgery Left   . Orif scapular fracture Right   . Back surgery    . Olecranon bursectomy Left 08/27/2012    Procedure: EXCISION LEFT OLECRANON BURSA;  Surgeon: Sanjuana Kava, MD;  Location: [facility];  Service: Orthopedics;  Laterality: Left;  . Olecranon bursectomy Left 05/25/2013    Procedure:  Excision sinus tract  and tissue left elbow ;  Surgeon: Sanjuana Kava, MD;  Location: [facility];  Service: Orthopedics;  Laterality: Left;  . Cardiac catheterization N/A 03/16/2015    Procedure: Coronary/Graft Angiography;  Surgeon: Sherren Mocha, MD;  Location: [place] [facility];  Service: Cardiovascular;  Laterality: N/A;  . Cardiac catheterization N/A 03/16/2015    Procedure: Coronary Stent Intervention;  Surgeon: Sherren Mocha, MD;  Location: [place] [facility];  Service: Cardiovascular;  Laterality: N/A;     Allergies:  Allergies  Allergen Reactions  . Codeine Other (See Comments)    Causes GI Upset     Home Medications Current Facility-Administered Medications  Medication Dose Route Frequency Provider Last Rate Last Dose  . 0.9 %  sodium chloride infusion   Intravenous Continuous Carmin Muskrat, MD 125 mL/hr at 03/23/15 2009    . albuterol (PROVENTIL) (2.5 MG/3ML) 0.083% nebulizer solution 2.5 mg  2.5 mg Inhalation Q6H PRN Flossie Dibble, MD      . amLODipine (NORVASC) tablet 5 mg  5 mg Oral Daily Flossie Dibble, MD      . aspirin chewable tablet 81 mg  81 mg Oral Daily Flossie Dibble, MD      . atorvastatin (LIPITOR) tablet 80 mg  80 mg Oral q1800 Flossie Dibble, MD      . heparin injection 5,000 Units  5,000 Units Subcutaneous 3 times per day Flossie Dibble, MD      . isosorbide mononitrate (IMDUR) 24 hr tablet 60 mg  60 mg Oral BID WC Flossie Dibble, MD      . metoprolol tartrate (LOPRESSOR) tablet 25 mg  25 mg Oral BID Flossie Dibble, MD   25 mg at 03/24/15 0100  . pantoprazole (PROTONIX) EC tablet 40 mg  40 mg Oral BID Flossie Dibble, MD   40 mg at 03/24/15 0100  . sertraline (ZOLOFT) tablet 50 mg  50 mg Oral BID Flossie Dibble, MD   50 mg at 03/24/15 0100  . sorbitol 70 % solution 30 mL  30 mL Oral Daily PRN Flossie Dibble, MD      . ticagrelor [facility]) tablet 90 mg  90 mg Oral BID Flossie Dibble, MD   90 mg at 03/24/15 0100   Facility-Administered Medications Ordered in Other  Encounters  Medication Dose Route Frequency Provider Last Rate Last Dose  . vancomycin (VANCOCIN) 1,500 mg in sodium chloride 0.9 % 500 mL IVPB  1,500 mg Intravenous Once Sanjuana Kava, MD         Family History  Problem Relation Age of Onset  . Heart disease Mother   . Heart disease Father      Social History   Social History  . Marital Status: Married  Spouse Name: N/A  . Number of Children: N/A  . Years of Education: N/A   Occupational History  . Dealer    Social History Main Topics  . Smoking status: Current Some Day Smoker -- 0.50 packs/day for 50 years    Types: Cigarettes  . Smokeless tobacco: Never Used  . Alcohol Use: No  . Drug Use: No  . Sexual Activity: Yes    Birth Control/ Protection: None   Other Topics Concern  . Not on file   Social History Narrative     Review of Systems: General: negative for chills, fever, night sweats or weight changes.  Cardiovascular: chest pain, dyspnea and dyspnea on exertion (can only go up 10 steps and can walk for 200 ft) negative for edema, orthopnea, palpitations, paroxysmal nocturnal dyspnea Dermatological: rash on the arms Respiratory: productive cough negative for  wheezing Urologic: negative for hematuria Abdominal: negative for nausea, vomiting, diarrhea, bright red blood per rectum, melena, or hematemesis Neurologic: negative for visual changes, syncope, or dizziness Endocrine: no diabetes, no hypothyroidism Immunological: no lymph adenopathy Psych: non homicidal/suicidal Musculoskeletal: lower back pain   Physical Exam: Vitals: BP 137/84 mmHg  Pulse 66  Temp(Src) 98.1 F (36.7 C) (Oral)  Resp 8  Ht 5\' 10"  (1.778 m)  Wt 57.5 kg (126 lb 12.2 oz)  BMI 18.19 kg/m2  SpO2 100% General: cachectic looking male, appears older than stated age, not in acute distress Neck: JVP flat, no thyromegaly Heart: regular rate and rhythm, S1, S2, no murmurs  Lungs: coarse crackles, clear with cough  GI: non tender,  non distended, bowel sounds present Extremities: no edema Neuro: AAO x 3  Psych: normal affect, no anxiety   Labs:   Results for orders placed or performed during the hospital encounter of 03/23/15 (from the past 24 hour(s))  CBC     Status: Abnormal   Collection Time: 03/23/15  6:49 PM  Result Value Ref Range   WBC 6.2 4.0 - 10.5 K/uL   RBC 3.53 (L) 4.22 - 5.81 MIL/uL   Hemoglobin 11.6 (L) 13.0 - 17.0 g/dL   HCT 33.5 (L) 39.0 - 52.0 %   MCV 94.9 78.0 - 100.0 fL   MCH 32.9 26.0 - 34.0 pg   MCHC 34.6 30.0 - 36.0 g/dL   RDW 14.3 11.5 - 15.5 %   Platelets 164 150 - 400 K/uL  Comprehensive metabolic panel     Status: Abnormal   Collection Time: 03/23/15  6:49 PM  Result Value Ref Range   Sodium 134 (L) 135 - 145 mmol/L   Potassium 2.9 (L) 3.5 - 5.1 mmol/L   Chloride 100 (L) 101 - 111 mmol/L   CO2 25 22 - 32 mmol/L   Glucose, Bld 93 65 - 99 mg/dL   BUN 9 6 - 20 mg/dL   Creatinine, Ser 0.68 0.61 - 1.24 mg/dL   Calcium 8.3 (L) 8.9 - 10.3 mg/dL   Total Protein 6.4 (L) 6.5 - 8.1 g/dL   Albumin 3.3 (L) 3.5 - 5.0 g/dL   AST 14 (L) 15 - 41 U/L   ALT 7 (L) 17 - 63 U/L   Alkaline Phosphatase 36 (L) 38 - 126 U/L   Total Bilirubin 0.3 0.3 - 1.2 mg/dL   GFR calc non Af Amer >60 >60 mL/min   GFR calc Af Amer >60 >60 mL/min   Anion gap 9 5 - 15  Magnesium     Status: Abnormal   Collection Time: 03/23/15  6:49 PM  Result Value Ref Range   Magnesium 1.6 (L) 1.7 - 2.4 mg/dL  Protime-INR     Status: Abnormal   Collection Time: 03/23/15  6:49 PM  Result Value Ref Range   Prothrombin Time 16.3 (H) 11.6 - 15.2 seconds   INR 1.29 0.00 - 1.49  Troponin I (order at [facility])     Status: Abnormal   Collection Time: 03/23/15  6:49 PM  Result Value Ref Range   Troponin I 0.30 (H) <0.031 ng/mL  Urine rapid drug screen (hosp performed)     Status: Abnormal   Collection Time: 03/23/15  7:30 PM  Result Value Ref Range   Opiates POSITIVE (A) NONE DETECTED   Cocaine NONE DETECTED NONE DETECTED    Benzodiazepines NONE DETECTED NONE DETECTED   Amphetamines NONE DETECTED NONE DETECTED   Tetrahydrocannabinol NONE DETECTED NONE DETECTED   Barbiturates NONE DETECTED NONE DETECTED  Urinalysis, Routine w reflex microscopic (not at [facility])     Status: Abnormal   Collection Time: 03/23/15  7:30 PM  Result Value Ref Range   Color, Urine YELLOW YELLOW   APPearance CLEAR CLEAR   Specific Gravity, Urine 1.010 1.005 - 1.030   pH 5.5 5.0 - 8.0   Glucose, UA NEGATIVE NEGATIVE mg/dL   Hgb urine dipstick TRACE (A) NEGATIVE   Bilirubin Urine NEGATIVE NEGATIVE   Ketones, ur NEGATIVE NEGATIVE mg/dL   Protein, ur NEGATIVE NEGATIVE mg/dL   Nitrite NEGATIVE NEGATIVE   Leukocytes, UA NEGATIVE NEGATIVE  Urine microscopic-add on     Status: Abnormal   Collection Time: 03/23/15  7:30 PM  Result Value Ref Range   Squamous Epithelial / LPF 0-5 (A) NONE SEEN   WBC, UA 0-5 0 - 5 WBC/hpf   RBC / HPF 0-5 0 - 5 RBC/hpf   Bacteria, UA NONE SEEN NONE SEEN     Radiology/Studies: Dg Chest 2 View  03/23/2015  CLINICAL DATA:  Patient found unresponsive in bathroom at home by wife, ammonia inhalant used by EMS and pt came around, pupils sluggish, NSR, pt reports left sided chest pain and back pain Current smoker, CAD, HTN, COPD EXAM: CHEST  2 VIEW COMPARISON:  Chest CT, 03/14/2015 FINDINGS: Cardiac silhouette is normal in size and configuration. Aorta is dilated. Patient has a known dissection. No mediastinal or hilar masses or evidence of adenopathy. Lungs are hyperexpanded but clear. No pleural effusion or pneumothorax. Bony thorax is intact. IMPRESSION: No acute cardiopulmonary disease. Electronically Signed   By: Lajean Manes M.D.   On: 03/23/2015 19:49   Ct Chest Wo Contrast  03/14/2015  CLINICAL DATA:  65 year old male with chest pain and shortness of breath for 3 days. Nausea. Smoker. Suggestion of acute opacity in the peripheral right lung on portable chest x-ray today. Initial encounter. EXAM: CT CHEST WITHOUT  CONTRAST TECHNIQUE: Multidetector CT imaging of the chest was performed following the standard protocol without IV contrast. COMPARISON:  Portable chest radiograph 0932 hours. Chest CTA 12/17/2012. FINDINGS: Minimal non dependent retained secretions in the trachea. Otherwise the major airways are patent. Pulmonary hyperinflation. Mild scarring in the lingula and left lower lobe. No confluent opacity in the right lung to correspond to the earlier radiographic appearance. No pleural effusion. No pulmonary nodule identified. Chronic enlargement of the thoracic aorta, seen to be associated with type B dissection in 2014. Stable enlarged descending aorta caliber of 41 mm, while the distal arch caliber of 46 mm appears mildly increased since 2014 (44 mm at that time). Stable sequelae of CABG.  No pericardial effusion. No mediastinal or hilar lymphadenopathy is evident. No axillary lymphadenopathy. Stable visualized upper abdominal viscera. The chronic dissection flap is partially calcified in the visualized upper abdominal. Aorta No acute osseous abnormality identified. IMPRESSION: 1. Chronic pulmonary hyperinflation with no acute pulmonary findings. No right lung opacity to correspond to the earlier radiographic appearance which must have been artifact. 2. Chronic aortic dissection. No evidence of rupture in the chest on this noncontrast study. The dissection was Stanford type B in 2014. Abnormally enlarged thoracic aortic caliber is stable to slightly increased since that time. Electronically Signed   By: Genevie Ann M.D.   On: 03/14/2015 11:30   Ct Angio Chest Pe W/cm &/or Wo Cm  03/14/2015  CLINICAL DATA:  Chest pain with elevated D-dimer. EXAM: CT ANGIOGRAPHY CHEST WITH CONTRAST TECHNIQUE: Multidetector CT imaging of the chest was performed using the standard protocol during bolus administration of intravenous contrast. Multiplanar CT image reconstructions and MIPs were obtained to evaluate the vascular anatomy.  CONTRAST:  138mL OMNIPAQUE IOHEXOL 350 MG/ML SOLN COMPARISON:  Chest CT without contrast performed earlier the same day. 12/17/2012 FINDINGS: Mediastinum / Lymph Nodes: There is no axillary lymphadenopathy. No mediastinal lymphadenopathy. There is no hilar lymphadenopathy. The heart size is normal. No pericardial effusion. Patient is status post CABG. No filling defects within the opacified pulmonary arteries to suggest the presence of an acute pulmonary embolus. As seen on the study from 2014, patient has a tight be dissection of the thoracic aorta. In 2014, the false lumen showed differential perfusion, but on today's study there is no evidence for enhancement of the false lumen although this study is timed for the pulmonary arteries and there is only minimal contrast entering the ascending aorta at the time of image acquisition. Lungs / Pleura: Hyperexpansion is consistent with emphysema. No pulmonary edema or pleural effusion. No focal airspace consolidation. Subsegmental atelectasis or linear scarring is seen in the lingula and left lower lobe. Upper Abdomen:  Unremarkable. MSK / Soft Tissues: Bone windows reveal no worrisome lytic or sclerotic osseous lesions. Review of the MIP images confirms the above findings. IMPRESSION: 1. No CT evidence for acute pulmonary embolus. 2. Chronic type B dissection of the thoracic aorta again noted without opacification of the false lumen on today's study although this may be in part related to early bolus timing for the pulmonary arterial study. 3. Emphysema without pulmonary edema or evidence of focal pneumonia. Electronically Signed   By: Misty Stanley M.D.   On: 03/14/2015 21:51   Dg Chest Portable 1 View  03/14/2015  CLINICAL DATA:  65 year old male with chest pain and shortness of breath for 3 days. Initial encounter. Smoker EXAM: PORTABLE CHEST 1 VIEW COMPARISON:  02/04/2014 and earlier. FINDINGS: Portable AP upright view at 0932 hours. Interval removal right PICC  line. Multiple EKG leads overlie the chest. Sequelae of CABG. Tortuous thoracic aorta appears stable. Other mediastinal contours are within normal limits. No pneumothorax, pulmonary edema or pleural effusion. Lung bases appear stable. There is suggestion of new patchy opacity in the peripheral right upper lobe (arrow). No other acute pulmonary opacity. IMPRESSION: 1. Suggestion of small area of acute patchy opacity in the peripheral right upper lobe, nonspecific but consider acute infectious exacerbation. 2. No associated pleural effusion and otherwise stable chest. Electronically Signed   By: Genevie Ann M.D.   On: 03/14/2015 09:48   Ct Angio Chest Aorta W/cm &/or Wo/cm  03/23/2015  CLINICAL DATA:  Syncope. Found passed out on  bathroom floor. Assess patient's known aortic dissection. Initial encounter. EXAM: CT ANGIOGRAPHY CHEST WITH CONTRAST TECHNIQUE: Multidetector CT imaging of the chest was performed using the standard protocol during bolus administration of intravenous contrast. Multiplanar CT image reconstructions and MIPs were obtained to evaluate the vascular anatomy. CONTRAST:  155mL OMNIPAQUE IOHEXOL 350 MG/ML SOLN COMPARISON:  CTA of the chest performed 03/14/2015 FINDINGS: The patient's chronic Stanford type B dissection of the descending thoracic aorta is grossly unchanged in appearance, with a fenestration at the superior aspect of the dissection. It measures up to 4.3 cm in diameter at the proximal descending thoracic aorta, 3.9 cm in transverse dimension at level of the diaphragm, and 3.8 cm in AP dimension just inferior to the level of the renal arteries. There is improved opacification of the false lumen compared to the prior study, reflecting the phase of contrast enhancement, though it does have less flow than the true lumen. There is partial thrombosis of the false lumen beginning at the mid descending thoracic aorta, with complete occlusion just below the level of the celiac trunk. The  dissection arises just distal to the origin of the left subclavian artery. The dissection flap extends into the celiac trunk and common hepatic artery, though the splenic artery appears to be supplied by a somewhat compressed true lumen, and a true lumen is also seen extending into the common hepatic artery. The dissection along the common hepatic artery is not well defined. The false lumen occludes below this level, and the superior mesenteric artery and bilateral renal arteries arise from the true lumen. Note that the proximal left renal artery is compressed as it passes through the thrombosed false lumen. There is no evidence of central pulmonary embolus. Minimal bibasilar atelectasis or scarring is noted. The lungs are otherwise clear. There is no evidence of significant focal consolidation, pleural effusion or pneumothorax. No masses are identified; no abnormal focal contrast enhancement is seen. Postoperative change is noted about the mediastinum. The patient is status post median sternotomy, with subsequent sternal wire removal. No mediastinal lymphadenopathy is seen. No pericardial effusion is identified. The great vessels are grossly unremarkable in appearance. The thyroid gland is unremarkable. No axillary lymphadenopathy is seen. The visualized portions of the liver and spleen are unremarkable. The visualized portions of the pancreas, stomach, adrenal glands and left kidney are within normal limits. No acute osseous abnormalities are seen. Review of the MIP images confirms the above findings. IMPRESSION: 1. Stable appearance to chronic Stanford type B dissection of the descending thoracic aorta, with fenestration at the superior aspect of the dissection. It measures up to 4.3 cm diameter at the proximal descending thoracic aorta, 3.9 cm in transverse dimension at the level of the diaphragm, and 3.8 cm in AP dimension just inferior to the level of the renal arteries. Partial thrombosis of the false lumen  beginning at the mid descending thoracic aorta, with complete occlusion just below the level of the celiac trunk. 2. Dissection flap extends into the celiac trunk and common hepatic artery, though the splenic artery appears to be supplied by a somewhat compressed true lumen. The dissection along the common hepatic artery is not well defined. The proximal left renal artery, though supplied by the true lumen, is compressed as it passes through the thrombosed false lumen. 3. No evidence of central pulmonary embolus. 4. Minimal bibasilar atelectasis or scarring noted. Lungs otherwise clear. Electronically Signed   By: Garald Balding M.D.   On: 03/23/2015 22:22    EKG: normal  sinus rhythm RSR' pattern, T wave inversion in the lateral leads  Echo: 2/23//2017 Left ventricle: The cavity size was normal. Wall thickness was increased in a pattern of mild LVH. Systolic function was normal. The estimated ejection fraction was in the range of 50% to 55%. There is hypokinesis of the inferolateral myocardium. Doppler parameters are consistent with abnormal left ventricular relaxation (grade 1 diastolic dysfunction).  Cardiac cath: 03/16/2015 Conclusion     SVG .  100% occluded  Origin lesion, 100% stenosed.  Ost RCA to Prox RCA lesion, 50% stenosed.  SVG .  This is a sequential graft with an SVG-diagonal and free RIMA that arises from the body of the SVG and goes to the ramus intermedius. The graft is patent throughout  Mid RCA lesion, 30% stenosed.  LIMA .  Patent graft  SVG .  Chronically occluded  Prox Graft lesion, 100% stenosed.  LM lesion, 50% stenosed.  Prox LAD lesion, 100% stenosed.  Prox Cx lesion, 99% stenosed.  Dist RCA lesion, 95% stenosed. Post intervention, there is a 0% residual stenosis.  1. Severe 3 vessel CAD 2. S/p CABG with continued patency of the LIMA-LAD, SVG-diagonal, and free RIMA-ramus 3. Chronic occlusion of the SVG-PDA and SVG-OM 4.  Successful PCI of critical stenosis in the native RCA using a DES platform  ASA/Brilinta x 12 months as tolerated. Should be ok for hospital discharge tomorrow as long as no complications arise.       Medical decision making:  Discussed care with the patient Discussed care with the physician on the phone Reviewed labs and imaging personally Reviewed prior records  ASSESSMENT AND PLAN:  This is a 65 y.o. male with  known history of NSTEMI 07/2007 s/p emergency CABG and recent PCI of native dRCA 95% with DES (occluded SVG to OM, occluded SVG to PDA,  Patent SVG to D1, free LIMA to ramus) for angina CCS IV, type B aortic dissection, hypertension, COPD, hiatal hernia, PUD who and chronic pain syndrome presented with chest pain and loss of consciousness with positive opioids in the urine.    Active Problems:   Aortic dissection (HCC)   HTN (hypertension)   CAD (coronary artery disease)   Hypercholesterolemia   Chronic back pain   Depression   Angina pectoris (HCC)   COPD mixed type (HCC)   Arteriosclerosis of autologous vein coronary artery bypass graft   Tobacco abuse   Chest pain   Elevated troponin   Chest pain at rest  Elevated troponin Unclear if this is new or related to intervention - cycle troponin, started on IV heparin ACS protocol, continue DAPT  Chest pain, atypical non cardiac localized to ribs Likely musculoskeletal in nature, norco ordered for pain control  Chronic lower back pain - started on norco prn, avoiding giving fentanyl and oxycodone, outpatient follow up with pain specialist recommended  Tobacco abuse Counseled for smoking cessation  COPD Home meds  Hypertension, essential  Continue home medications  Aortic dissection type B with compression of left renal artery  Stable per CT scan, outpatient follow up in 6 months  Coronary artery disease in native artery Continue aspirin, lipitor 80 mg daily, imdur, amlodipine and zoloft  Weight  loss Outpatient follow up for cancer screening  Signed, Flossie Dibble, MD MS 03/24/2015, 1:24 AM

## 2015-03-24 DIAGNOSIS — R55 Syncope and collapse: Secondary | ICD-10-CM | POA: Insufficient documentation

## 2015-03-24 DIAGNOSIS — I209 Angina pectoris, unspecified: Secondary | ICD-10-CM

## 2015-03-24 DIAGNOSIS — Z720 Tobacco use: Secondary | ICD-10-CM

## 2015-03-24 LAB — COMPREHENSIVE METABOLIC PANEL
ALBUMIN: 3.3 g/dL — AB (ref 3.5–5.0)
ALT: 7 U/L — AB (ref 17–63)
ALT: 7 U/L — AB (ref 17–63)
ANION GAP: 10 (ref 5–15)
AST: 15 U/L (ref 15–41)
AST: 15 U/L (ref 15–41)
Albumin: 3.2 g/dL — ABNORMAL LOW (ref 3.5–5.0)
Alkaline Phosphatase: 38 U/L (ref 38–126)
Alkaline Phosphatase: 37 U/L — ABNORMAL LOW (ref 38–126)
Anion gap: 11 (ref 5–15)
BILIRUBIN TOTAL: 0.4 mg/dL (ref 0.3–1.2)
BUN: 6 mg/dL (ref 6–20)
BUN: <5 mg/dL — ABNORMAL LOW (ref 6–20)
CALCIUM: 8.8 mg/dL — AB (ref 8.9–10.3)
CHLORIDE: 105 mmol/L (ref 101–111)
CO2: 23 mmol/L (ref 22–32)
CO2: 22 mmol/L (ref 22–32)
CREATININE: 0.71 mg/dL (ref 0.61–1.24)
CREATININE: 0.7 mg/dL (ref 0.61–1.24)
Calcium: 8.6 mg/dL — ABNORMAL LOW (ref 8.9–10.3)
Chloride: 104 mmol/L (ref 101–111)
GFR calc Af Amer: >60 mL/min (ref >60)
GLUCOSE: 84 mg/dL (ref 65–99)
Glucose, Bld: 107 mg/dL — ABNORMAL HIGH (ref 65–99)
POTASSIUM: 3.5 mmol/L (ref 3.5–5.1)
POTASSIUM: 3.2 mmol/L — AB (ref 3.5–5.1)
SODIUM: 137 mmol/L (ref 135–145)
Sodium: 138 mmol/L (ref 135–145)
TOTAL PROTEIN: 6.3 g/dL — AB (ref 6.5–8.1)
Total Bilirubin: 0.5 mg/dL (ref 0.3–1.2)
Total Protein: 6.2 g/dL — ABNORMAL LOW (ref 6.5–8.1)

## 2015-03-24 LAB — CBC
HCT: 34.3 % — ABNORMAL LOW (ref 39.0–52.0)
HEMATOCRIT: 34.4 % — AB (ref 39.0–52.0)
HEMOGLOBIN: 12 g/dL — AB (ref 13.0–17.0)
Hemoglobin: 11.6 g/dL — ABNORMAL LOW (ref 13.0–17.0)
MCH: 31.5 pg (ref 26.0–34.0)
MCH: 32.8 pg (ref 26.0–34.0)
MCHC: 33.7 g/dL (ref 30.0–36.0)
MCHC: 35 g/dL (ref 30.0–36.0)
MCV: 93.5 fL (ref 78.0–100.0)
MCV: 93.7 fL (ref 78.0–100.0)
Platelets: 162 10*3/uL (ref 150–400)
Platelets: 157 10*3/uL (ref 150–400)
RBC: 3.68 MIL/uL — ABNORMAL LOW (ref 4.22–5.81)
RBC: 3.66 MIL/uL — AB (ref 4.22–5.81)
RDW: 14.3 % (ref 11.5–15.5)
RDW: 14.5 % (ref 11.5–15.5)
WBC: 6.2 10*3/uL (ref 4.0–10.5)
WBC: 5.3 10*3/uL (ref 4.0–10.5)

## 2015-03-24 LAB — MRSA PCR SCREENING: MRSA BY PCR: NEGATIVE

## 2015-03-24 LAB — TROPONIN I
Troponin I: 0.29 ng/mL — ABNORMAL HIGH (ref <0.031)
Troponin I: 0.21 ng/mL — ABNORMAL HIGH (ref <0.031)
Troponin I: 0.19 ng/mL — ABNORMAL HIGH (ref <0.031)

## 2015-03-24 LAB — PROTIME-INR
INR: 1.28 (ref 0.00–1.49)
PROTHROMBIN TIME: 16.1 s — AB (ref 11.6–15.2)

## 2015-03-24 LAB — GLUCOSE, CAPILLARY: Glucose-Capillary: 91 mg/dL (ref 65–99)

## 2015-03-24 LAB — HEPARIN LEVEL (UNFRACTIONATED): HEPARIN UNFRACTIONATED: 0.17 [IU]/mL — AB (ref 0.30–0.70)

## 2015-03-24 MED ORDER — ASPIRIN 81 MG PO CHEW
81.0000 mg | CHEWABLE_TABLET | Freq: Every day | ORAL | Status: DC
  Administered 2015-03-24: 81 mg via ORAL
  Filled 2015-03-24 (×2): qty 1

## 2015-03-24 MED ORDER — ISOSORBIDE MONONITRATE ER 60 MG PO TB24
60.0000 mg | ORAL_TABLET | Freq: Two times a day (BID) | ORAL | Status: DC
  Administered 2015-03-24: 60 mg via ORAL
  Filled 2015-03-24: qty 1

## 2015-03-24 MED ORDER — ATORVASTATIN CALCIUM 80 MG PO TABS: 80.0000 mg | ORAL_TABLET | Freq: Every day | ORAL | Status: DC

## 2015-03-24 MED ORDER — HEPARIN SODIUM (PORCINE) 5000 UNIT/ML IJ SOLN: 5000.0000 [IU] | Freq: Three times a day (TID) | INTRAMUSCULAR | Status: DC

## 2015-03-24 MED ORDER — SERTRALINE HCL 50 MG PO TABS
50.0000 mg | ORAL_TABLET | Freq: Two times a day (BID) | ORAL | Status: DC
  Administered 2015-03-24 (×2): 50 mg via ORAL
  Filled 2015-03-24 (×2): qty 1

## 2015-03-24 MED ORDER — BOOST / RESOURCE BREEZE PO LIQD: 1.0000 | Freq: Three times a day (TID) | ORAL | Status: DC

## 2015-03-24 MED ORDER — HEPARIN BOLUS VIA INFUSION
2000.0000 [IU] | Freq: Once | INTRAVENOUS | Status: DC
  Filled 2015-03-24: qty 2000

## 2015-03-24 MED ORDER — PANTOPRAZOLE SODIUM 40 MG PO TBEC
40.0000 mg | DELAYED_RELEASE_TABLET | Freq: Two times a day (BID) | ORAL | Status: DC
  Administered 2015-03-24 (×2): 40 mg via ORAL
  Filled 2015-03-24 (×2): qty 1

## 2015-03-24 MED ORDER — HEPARIN BOLUS VIA INFUSION
4000.0000 [IU] | Freq: Once | INTRAVENOUS | Status: AC
  Administered 2015-03-24: 4000 [IU] via INTRAVENOUS
  Filled 2015-03-24: qty 4000

## 2015-03-24 MED ORDER — HYDROCODONE-ACETAMINOPHEN 7.5-325 MG PO TABS
1.0000 | ORAL_TABLET | Freq: Four times a day (QID) | ORAL | Status: DC | PRN
  Administered 2015-03-24 (×2): 1 via ORAL
  Filled 2015-03-24 (×2): qty 1

## 2015-03-24 MED ORDER — AMLODIPINE BESYLATE 5 MG PO TABS
5.0000 mg | ORAL_TABLET | Freq: Every day | ORAL | Status: DC
  Administered 2015-03-24: 5 mg via ORAL
  Filled 2015-03-24: qty 1

## 2015-03-24 MED ORDER — ALBUTEROL SULFATE (2.5 MG/3ML) 0.083% IN NEBU: 2.5000 mg | INHALATION_SOLUTION | Freq: Four times a day (QID) | RESPIRATORY_TRACT | Status: DC | PRN

## 2015-03-24 MED ORDER — TICAGRELOR 90 MG PO TABS
90.0000 mg | ORAL_TABLET | Freq: Two times a day (BID) | ORAL | Status: DC
  Administered 2015-03-24 (×2): 90 mg via ORAL
  Filled 2015-03-24 (×2): qty 1

## 2015-03-24 MED ORDER — METOPROLOL TARTRATE 25 MG PO TABS
25.0000 mg | ORAL_TABLET | Freq: Two times a day (BID) | ORAL | Status: DC
  Administered 2015-03-24 (×2): 25 mg via ORAL
  Filled 2015-03-24 (×2): qty 1

## 2015-03-24 MED ORDER — SORBITOL 70 % SOLN: 30.0000 mL | Freq: Every day | Status: DC | PRN

## 2015-03-24 MED ORDER — NICOTINE 21 MG/24HR TD PT24
21.0000 mg | MEDICATED_PATCH | Freq: Every day | TRANSDERMAL | Status: DC
  Administered 2015-03-24: 21 mg via TRANSDERMAL
  Filled 2015-03-24: qty 1

## 2015-03-24 MED ORDER — HEPARIN (PORCINE) IN NACL 100-0.45 UNIT/ML-% IJ SOLN
1050.0000 [IU]/h | INTRAMUSCULAR | Status: DC
  Administered 2015-03-24: 900 [IU]/h via INTRAVENOUS
  Filled 2015-03-24: qty 250

## 2015-03-24 NOTE — Progress Notes (Signed)
ANTICOAGULATION CONSULT NOTE - Initial Consult  Pharmacy Consult for Heparin  Indication: chest pain/ACS  Allergies  Allergen Reactions  . Codeine Other (See Comments)    Causes GI Upset    Patient Measurements: Height: 5\' 10"  (177.8 cm) Weight: 126 lb 12.2 oz (57.5 kg) IBW/kg (Calculated) : 73  Vital Signs: Temp: 98.1 F (36.7 C) (03/03 0000) Temp Source: Oral (03/03 0000) BP: 137/84 mmHg (03/03 0100) Pulse Rate: 66 (03/03 0100)  Labs:  Recent Labs  03/23/15 1849  HGB 11.6*  HCT 33.5*  PLT 164  LABPROT 16.3*  INR 1.29  CREATININE 0.68  TROPONINI 0.30*    Estimated Creatinine Clearance: 76.9 mL/min (by C-G formula based on Cr of 0.68).   Medical History: Past Medical History  Diagnosis Date  . Cardiac tamponade   . Aortic dissection (HCC)     TYPE 3  . HTN (hypertension)   . CAD (coronary artery disease)   . Hypercholesterolemia   . Chronic back pain   . Hiatal hernia   . Schatzki's ring   . Chronic fatigue   . Chronic pain   . Peptic ulcer disease 08/2010    EGD   . COPD (chronic obstructive pulmonary disease) ([place])   . Chronic bronchitis [facility])     Assessment: 65 y/o M with significant cardiac history including previous CABG, recent PCI on 03/16/15, pt has known aortic dissection, pt back to ED with loss of consciousness at home, troponin is mildly elevated, starting heparin, labs reviewed.   Goal of Therapy:  Heparin level 0.3-0.7 units/ml Monitor platelets by anticoagulation protocol: Yes   Plan:  -Heparin 4000 units BOLUS -Start heparin drip at 900 units/hr -1000 HL -Daily CBC/HL -Monitor for bleeding -F/U cardiology plans  Narda Bonds 03/24/2015,1:35 AM

## 2015-03-24 NOTE — Progress Notes (Signed)
   The admitting data from earlier this morning has been reviewed.  Complicated situation with CAD and prior CABG with graft failure, recent DES distal RCA, chronic type B aortic dissection, COPD, depression, continued smoking history, presenting with chest and back pain followed by syncope.  Repeat CT chest and abdomen is stable compared to prior.  Trace positive cardiac markers.  Continue serial markers evaluation. Treat chest discomfort symptomatically. Determine if chest discomfort is musculoskeletal versus aortic. Clinical observation for now.

## 2015-03-24 NOTE — Progress Notes (Addendum)
Initial Nutrition Assessment  DOCUMENTATION CODES:   Underweight, Severe malnutrition in context of chronic illness  INTERVENTION:   -Boost Breeze po TID, each supplement provides 250 kcal and 9 grams of protein  NUTRITION DIAGNOSIS:   Malnutrition related to chronic illness as evidenced by severe depletion of body fat, severe depletion of muscle mass, percent weight loss.  GOAL:   Patient will meet greater than or equal to 90% of their needs  MONITOR:   PO intake, Supplement acceptance, Labs, Weight trends, Skin, I & O's  REASON FOR ASSESSMENT:    (low BMI)    ASSESSMENT:   This is a 65 y.o. male with known history of NSTEMI 07/2007 s/p emergency CABG and recent PCI of native dRCA 95% with DES (occluded SVG to OM, occluded SVG to PDA, Patent SVG to D1, free LIMA to ramus) for angina CCS IV, type B aortic dissection, hypertension, COPD, hiatal hernia, PUD who and chronic pain syndrome presented with chest pain and loss of consciousness with positive opioids in the urine.   Pt admitted with elevated troponin.   Spoke with pt at bedside. He expressed frustration over ongoing weight loss, despite good appetite. Pt reveals that his appetite is very good, but continues to lose weight. He reports he consumes 3-4 meals PTA: Breakfast: grits, eggs, sausage; Lunch: fast food or deli sandwich; Dinner: meat, starch, and vegetable, and snack q HS. His wife does not of the food preparations. He consumed all of his breakfast this morning.   Pt reports his UBW was around 230#, which he maintained prior to having a heart attack in 2010. Since then, he reports weight loss, but unable to further describe quantity or time frame for weight loss. Per wt hx, pt has experienced a 16% wt loss over the past 6 months, which is significant for time frame.   Nutrition-Focused physical exam completed. Findings are moderate to severe fat depletion, moderate to severe muscle depletion, and no edema.    Discussed importance of good meal intake to promote healing. Pt reports he does not like Ensure supplements, but agree able to [employer]. RD to order.   Case discussed with RN. She reports that pt did not consume Ensure during previous admission as well.   Labs reviewed: K: 3.2.   Diet Order:  Diet Heart Room service appropriate?: Yes; Fluid consistency:: Thin  Skin:  Reviewed, no issues  Last BM:  03/24/15  Height:   Ht Readings from Last 1 Encounters:  03/24/15 5\' 10"  (1.778 m)    Weight:   Wt Readings from Last 1 Encounters:  03/24/15 126 lb 12.2 oz (57.5 kg)    Ideal Body Weight:  75.5 kg  BMI:  Body mass index is 18.19 kg/(m^2).  Estimated Nutritional Needs:   Kcal:  1700-1900  Protein:  85-100 grams  Fluid:  1.7-1.9 L  EDUCATION NEEDS:   Education needs addressed  Joaovictor Krone A. Jimmye Norman, RD, LDN, CDE Pager: [phone] After hours Pager: [phone]

## 2015-03-24 NOTE — Progress Notes (Signed)
ANTICOAGULATION CONSULT NOTE - Follow Up Consult  Pharmacy Consult for heparin  Indication: chest pain/ACS  Allergies  Allergen Reactions  . Codeine Other (See Comments)    Causes GI Upset    Patient Measurements: Height: 5\' 10"  (177.8 cm) Weight: 126 lb 12.2 oz (57.5 kg) IBW/kg (Calculated) : 73  Vital Signs: Temp: 97.7 F (36.5 C) (03/03 0834) Temp Source: Oral (03/03 0834) BP: 125/99 mmHg (03/03 0834) Pulse Rate: 61 (03/03 0834)  Labs:  Recent Labs  03/23/15 1849 03/24/15 0309 03/24/15 0618 03/24/15 0950 03/24/15 0954  HGB 11.6* 11.6* 12.0*  --   --   HCT 33.5* 34.4* 34.3*  --   --   PLT 164 162 157  --   --   LABPROT 16.3*  --  16.1*  --   --   INR 1.29  --  1.28  --   --   HEPARINUNFRC  --   --   --  0.17*  --   CREATININE 0.68 0.71 0.70  --   --   TROPONINI 0.30* 0.29* 0.21*  --  0.19*    Estimated Creatinine Clearance: 76.9 mL/min (by C-G formula based on Cr of 0.7).   Medications:  Scheduled:  . amLODipine  5 mg Oral Daily  . aspirin  81 mg Oral Daily  . atorvastatin  80 mg Oral q1800  . feeding supplement  1 Container Oral TID BM  . isosorbide mononitrate  60 mg Oral BID WC  . metoprolol tartrate  25 mg Oral BID  . nicotine  21 mg Transdermal Daily  . pantoprazole  40 mg Oral BID  . sertraline  50 mg Oral BID  . ticagrelor  90 mg Oral BID    Assessment: 65 y/o M with significant cardiac history including previous CABG, recent PCI on 03/16/15, pt has known aortic dissection, pt back to ED with loss of consciousness at home. Pharmacy is dosing heparin for possible ACS. -Initial heparin level = 0.19  Goal of Therapy:  Heparin level 0.3-0.7 units/ml Monitor platelets by anticoagulation protocol: Yes   Plan:  -Heparin bolus 2000 units IV and increase to 1050 units/hr -Heparin level in 6 hours and daily wth CBC daily  Hildred Laser, Pharm D 03/24/2015 11:50 AM

## 2015-03-24 NOTE — Care Management Note (Signed)
Case Management Note  Patient Details  Name: [name] MRN: [id number] Date of Birth: 1950-03-03  Subjective/Objective:        Adm w ch pain, syncope            Action/Plan: lives w wife, pcp dr Wynetta Emery   Expected Discharge Date:                  Expected Discharge Plan:     In-House Referral:     Discharge planning Services     Post Acute Care Choice:    Choice offered to:     DME Arranged:    DME Agency:     HH Arranged:    [place] Agency:     Status of Service:     Medicare Important Message Given:    Date Medicare IM Given:    Medicare IM give by:    Date Additional Medicare IM Given:    Additional Medicare Important Message give by:     If discussed at [place] of Stay Meetings, dates discussed:    Additional Comments: ur review done  Lacretia Leigh, RN 03/24/2015, 8:57 AM

## 2015-03-24 NOTE — Progress Notes (Signed)
Mr Schooley repeatedly stating that he wants to leave, upset that the physician will not order him anything stronger than norco for pain.  States that if they arent going to treat his pain then he will go home.  Explained to the pt that the doctors were doing everything to find the cause of his pain and treat it, but the pt stated that the only thing that works is Fentanyl and if he wasn't going to get it then he was leaving.  Called Dr Tamala Julian who is aware, pt signed [place] paperwork and left hospital without incidence

## 2015-04-03 ENCOUNTER — Emergency Department (HOSPITAL_COMMUNITY): Payer: Medicare PPO

## 2015-04-03 ENCOUNTER — Encounter (HOSPITAL_COMMUNITY): Payer: Self-pay | Admitting: Emergency Medicine

## 2015-04-03 ENCOUNTER — Inpatient Hospital Stay (HOSPITAL_COMMUNITY)
Admission: EM | Admit: 2015-04-03 | Discharge: 2015-04-10 | DRG: 871 | Disposition: A | Discharge status: Home Health | Payer: Medicare PPO | Attending: Internal Medicine | Admitting: Internal Medicine

## 2015-04-03 DIAGNOSIS — T17908A Unspecified foreign body in respiratory tract, part unspecified causing other injury, initial encounter: Secondary | ICD-10-CM | POA: Insufficient documentation

## 2015-04-03 DIAGNOSIS — I7101 Dissection of thoracic aorta: Secondary | ICD-10-CM | POA: Diagnosis not present

## 2015-04-03 DIAGNOSIS — J690 Pneumonitis due to inhalation of food and vomit: Secondary | ICD-10-CM | POA: Diagnosis present

## 2015-04-03 DIAGNOSIS — I5032 Chronic diastolic (congestive) heart failure: Secondary | ICD-10-CM | POA: Diagnosis present

## 2015-04-03 DIAGNOSIS — E8770 Fluid overload, unspecified: Secondary | ICD-10-CM | POA: Diagnosis not present

## 2015-04-03 DIAGNOSIS — G9341 Metabolic encephalopathy: Secondary | ICD-10-CM | POA: Diagnosis present

## 2015-04-03 DIAGNOSIS — T391X1A Poisoning by 4-Aminophenol derivatives, accidental (unintentional), initial encounter: Secondary | ICD-10-CM | POA: Diagnosis present

## 2015-04-03 DIAGNOSIS — I248 Other forms of acute ischemic heart disease: Secondary | ICD-10-CM | POA: Diagnosis present

## 2015-04-03 DIAGNOSIS — M549 Dorsalgia, unspecified: Secondary | ICD-10-CM | POA: Diagnosis present

## 2015-04-03 DIAGNOSIS — J449 Chronic obstructive pulmonary disease, unspecified: Secondary | ICD-10-CM | POA: Diagnosis present

## 2015-04-03 DIAGNOSIS — E440 Moderate protein-calorie malnutrition: Secondary | ICD-10-CM | POA: Diagnosis present

## 2015-04-03 DIAGNOSIS — Z681 Body mass index (BMI) 19 or less, adult: Secondary | ICD-10-CM

## 2015-04-03 DIAGNOSIS — I7100 Dissection of unspecified site of aorta: Secondary | ICD-10-CM | POA: Diagnosis present

## 2015-04-03 DIAGNOSIS — R739 Hyperglycemia, unspecified: Secondary | ICD-10-CM | POA: Diagnosis present

## 2015-04-03 DIAGNOSIS — D696 Thrombocytopenia, unspecified: Secondary | ICD-10-CM | POA: Diagnosis not present

## 2015-04-03 DIAGNOSIS — T17998A Other foreign object in respiratory tract, part unspecified causing other injury, initial encounter: Secondary | ICD-10-CM | POA: Diagnosis not present

## 2015-04-03 DIAGNOSIS — Z955 Presence of coronary angioplasty implant and graft: Secondary | ICD-10-CM

## 2015-04-03 DIAGNOSIS — J9600 Acute respiratory failure, unspecified whether with hypoxia or hypercapnia: Secondary | ICD-10-CM | POA: Diagnosis not present

## 2015-04-03 DIAGNOSIS — R7989 Other specified abnormal findings of blood chemistry: Secondary | ICD-10-CM | POA: Diagnosis not present

## 2015-04-03 DIAGNOSIS — R319 Hematuria, unspecified: Secondary | ICD-10-CM | POA: Diagnosis not present

## 2015-04-03 DIAGNOSIS — T39091A Poisoning by salicylates, accidental (unintentional), initial encounter: Secondary | ICD-10-CM | POA: Diagnosis present

## 2015-04-03 DIAGNOSIS — I110 Hypertensive heart disease with heart failure: Secondary | ICD-10-CM | POA: Diagnosis present

## 2015-04-03 DIAGNOSIS — R402422 Glasgow coma scale score 9-12, at arrival to emergency department: Secondary | ICD-10-CM | POA: Diagnosis present

## 2015-04-03 DIAGNOSIS — T17998S Other foreign object in respiratory tract, part unspecified causing other injury, sequela: Secondary | ICD-10-CM | POA: Diagnosis not present

## 2015-04-03 DIAGNOSIS — E871 Hypo-osmolality and hyponatremia: Secondary | ICD-10-CM | POA: Diagnosis present

## 2015-04-03 DIAGNOSIS — J9601 Acute respiratory failure with hypoxia: Secondary | ICD-10-CM | POA: Diagnosis not present

## 2015-04-03 DIAGNOSIS — Z8711 Personal history of peptic ulcer disease: Secondary | ICD-10-CM | POA: Diagnosis not present

## 2015-04-03 DIAGNOSIS — E876 Hypokalemia: Secondary | ICD-10-CM | POA: Diagnosis not present

## 2015-04-03 DIAGNOSIS — Z87891 Personal history of nicotine dependence: Secondary | ICD-10-CM

## 2015-04-03 DIAGNOSIS — J189 Pneumonia, unspecified organism: Secondary | ICD-10-CM | POA: Diagnosis not present

## 2015-04-03 DIAGNOSIS — N365 Urethral false passage: Secondary | ICD-10-CM | POA: Diagnosis present

## 2015-04-03 DIAGNOSIS — D649 Anemia, unspecified: Secondary | ICD-10-CM | POA: Diagnosis present

## 2015-04-03 DIAGNOSIS — F1010 Alcohol abuse, uncomplicated: Secondary | ICD-10-CM | POA: Diagnosis present

## 2015-04-03 DIAGNOSIS — R740 Nonspecific elevation of levels of transaminase and lactic acid dehydrogenase [LDH]: Secondary | ICD-10-CM | POA: Diagnosis present

## 2015-04-03 DIAGNOSIS — E872 Acidosis, unspecified: Secondary | ICD-10-CM | POA: Insufficient documentation

## 2015-04-03 DIAGNOSIS — G9340 Encephalopathy, unspecified: Secondary | ICD-10-CM

## 2015-04-03 DIAGNOSIS — Z951 Presence of aortocoronary bypass graft: Secondary | ICD-10-CM

## 2015-04-03 DIAGNOSIS — J9691 Respiratory failure, unspecified with hypoxia: Secondary | ICD-10-CM | POA: Diagnosis present

## 2015-04-03 DIAGNOSIS — E785 Hyperlipidemia, unspecified: Secondary | ICD-10-CM | POA: Diagnosis present

## 2015-04-03 DIAGNOSIS — I2510 Atherosclerotic heart disease of native coronary artery without angina pectoris: Secondary | ICD-10-CM | POA: Diagnosis present

## 2015-04-03 DIAGNOSIS — I71012 Dissection of descending thoracic aorta: Secondary | ICD-10-CM | POA: Insufficient documentation

## 2015-04-03 DIAGNOSIS — G8929 Other chronic pain: Secondary | ICD-10-CM | POA: Diagnosis present

## 2015-04-03 DIAGNOSIS — K449 Diaphragmatic hernia without obstruction or gangrene: Secondary | ICD-10-CM | POA: Diagnosis present

## 2015-04-03 DIAGNOSIS — R778 Other specified abnormalities of plasma proteins: Secondary | ICD-10-CM

## 2015-04-03 DIAGNOSIS — A419 Sepsis, unspecified organism: Secondary | ICD-10-CM | POA: Diagnosis present

## 2015-04-03 DIAGNOSIS — J9620 Acute and chronic respiratory failure, unspecified whether with hypoxia or hypercapnia: Secondary | ICD-10-CM | POA: Insufficient documentation

## 2015-04-03 LAB — I-STAT CHEM 8, ED
BUN: 33 mg/dL — ABNORMAL HIGH (ref 6–20)
CHLORIDE: 100 mmol/L — AB (ref 101–111)
Calcium, Ion: 1.04 mmol/L — ABNORMAL LOW (ref 1.13–1.30)
Creatinine, Ser: 1 mg/dL (ref 0.61–1.24)
Glucose, Bld: 93 mg/dL (ref 65–99)
HEMATOCRIT: 39 % (ref 39.0–52.0)
HEMOGLOBIN: 13.3 g/dL (ref 13.0–17.0)
POTASSIUM: 4.3 mmol/L (ref 3.5–5.1)
Sodium: 133 mmol/L — ABNORMAL LOW (ref 135–145)
TCO2: 18 mmol/L (ref 0–100)

## 2015-04-03 LAB — CBC WITH DIFFERENTIAL/PLATELET
Basophils Absolute: 0 10*3/uL (ref 0.0–0.1)
Basophils Relative: 0 %
Eosinophils Absolute: 0 10*3/uL (ref 0.0–0.7)
Eosinophils Relative: 0 %
HEMATOCRIT: 34.8 % — AB (ref 39.0–52.0)
Hemoglobin: 11.1 g/dL — ABNORMAL LOW (ref 13.0–17.0)
Lymphocytes Relative: 15 %
Lymphs Abs: 1.3 10*3/uL (ref 0.7–4.0)
MCH: 32.1 pg (ref 26.0–34.0)
MCHC: 31.9 g/dL (ref 30.0–36.0)
MCV: 100.6 fL — AB (ref 78.0–100.0)
MONO ABS: 0.9 10*3/uL (ref 0.1–1.0)
MONOS PCT: 10 %
Neutro Abs: 6.9 10*3/uL (ref 1.7–7.7)
Neutrophils Relative %: 75 %
Platelets: 159 10*3/uL (ref 150–400)
RBC: 3.46 MIL/uL — ABNORMAL LOW (ref 4.22–5.81)
RDW: 16.6 % — AB (ref 11.5–15.5)
WBC: 9.2 10*3/uL (ref 4.0–10.5)

## 2015-04-03 LAB — COMPREHENSIVE METABOLIC PANEL
ALT: 500 U/L — ABNORMAL HIGH (ref 17–63)
ANION GAP: 16 — AB (ref 5–15)
AST: 633 U/L — ABNORMAL HIGH (ref 15–41)
Albumin: 3.1 g/dL — ABNORMAL LOW (ref 3.5–5.0)
Alkaline Phosphatase: 69 U/L (ref 38–126)
BILIRUBIN TOTAL: 0.5 mg/dL (ref 0.3–1.2)
BUN: 30 mg/dL — AB (ref 6–20)
CO2: 15 mmol/L — ABNORMAL LOW (ref 22–32)
Calcium: 8.1 mg/dL — ABNORMAL LOW (ref 8.9–10.3)
Chloride: 102 mmol/L (ref 101–111)
Creatinine, Ser: 1.16 mg/dL (ref 0.61–1.24)
GFR calc Af Amer: >60 mL/min (ref >60)
Glucose, Bld: 102 mg/dL — ABNORMAL HIGH (ref 65–99)
POTASSIUM: 4.4 mmol/L (ref 3.5–5.1)
Sodium: 133 mmol/L — ABNORMAL LOW (ref 135–145)
TOTAL PROTEIN: 5.9 g/dL — AB (ref 6.5–8.1)

## 2015-04-03 LAB — URINE MICROSCOPIC-ADD ON

## 2015-04-03 LAB — I-STAT ARTERIAL BLOOD GAS, ED
ACID-BASE DEFICIT: 9 mmol/L — AB (ref 0.0–2.0)
Acid-base deficit: 9 mmol/L — ABNORMAL HIGH (ref 0.0–2.0)
BICARBONATE: 21.6 meq/L (ref 20.0–24.0)
BICARBONATE: 19.9 meq/L — AB (ref 20.0–24.0)
O2 Saturation: 99 %
O2 Saturation: 96 %
PH ART: 7.093 — AB (ref 7.350–7.450)
PO2 ART: 94 mmHg (ref 80.0–100.0)
TCO2: 24 mmol/L (ref 0–100)
TCO2: 22 mmol/L (ref 0–100)
pCO2 arterial: 68.4 mmHg (ref 35.0–45.0)
pCO2 arterial: 51.2 mmHg — ABNORMAL HIGH (ref 35.0–45.0)
pH, Arterial: 7.187 — CL (ref 7.350–7.450)
pO2, Arterial: 169 mmHg — ABNORMAL HIGH (ref 80.0–100.0)

## 2015-04-03 LAB — ETHYLENE GLYCOL: ETHYLENE GLYCOL LVL: NOT DETECTED mg/dL

## 2015-04-03 LAB — I-STAT TROPONIN, ED: TROPONIN I, POC: 1.63 ng/mL — AB (ref 0.00–0.08)

## 2015-04-03 LAB — TROPONIN I
TROPONIN I: 2.62 ng/mL — AB (ref <0.031)
Troponin I: 2.08 ng/mL (ref <0.031)

## 2015-04-03 LAB — PROTIME-INR
INR: 2.21 — AB (ref 0.00–1.49)
PROTHROMBIN TIME: 24.3 s — AB (ref 11.6–15.2)

## 2015-04-03 LAB — URINALYSIS, ROUTINE W REFLEX MICROSCOPIC
Bilirubin Urine: NEGATIVE
Glucose, UA: NEGATIVE mg/dL
Ketones, ur: 15 mg/dL — AB
LEUKOCYTES UA: NEGATIVE
Nitrite: NEGATIVE
Protein, ur: 30 mg/dL — AB
SPECIFIC GRAVITY, URINE: 1.038 — AB (ref 1.005–1.030)
pH: 5 (ref 5.0–8.0)

## 2015-04-03 LAB — GLUCOSE, CAPILLARY
GLUCOSE-CAPILLARY: 106 mg/dL — AB (ref 65–99)
Glucose-Capillary: 153 mg/dL — ABNORMAL HIGH (ref 65–99)

## 2015-04-03 LAB — TYPE AND SCREEN
ABO/RH(D): A NEG
Antibody Screen: NEGATIVE

## 2015-04-03 LAB — TSH: TSH: 5.803 u[IU]/mL — ABNORMAL HIGH (ref 0.350–4.500)

## 2015-04-03 LAB — ACETAMINOPHEN LEVEL: ACETAMINOPHEN (TYLENOL), SERUM: 16 ug/mL (ref 10–30)

## 2015-04-03 LAB — ETHANOL: Alcohol, Ethyl (B): <5 mg/dL (ref <5)

## 2015-04-03 LAB — MRSA PCR SCREENING: MRSA by PCR: NEGATIVE

## 2015-04-03 LAB — RAPID URINE DRUG SCREEN, HOSP PERFORMED
AMPHETAMINES: NOT DETECTED
BENZODIAZEPINES: POSITIVE — AB
Barbiturates: NOT DETECTED
COCAINE: NOT DETECTED
Opiates: NOT DETECTED
Tetrahydrocannabinol: NOT DETECTED

## 2015-04-03 LAB — LACTIC ACID, PLASMA
Lactic Acid, Venous: 2.1 mmol/L (ref 0.5–2.0)
Lactic Acid, Venous: 1.8 mmol/L (ref 0.5–2.0)

## 2015-04-03 LAB — OSMOLALITY: OSMOLALITY: 292 mosm/kg (ref 275–295)

## 2015-04-03 LAB — I-STAT CG4 LACTIC ACID, ED
LACTIC ACID, VENOUS: 5.9 mmol/L — AB (ref 0.5–2.0)
Lactic Acid, Venous: 2.2 mmol/L (ref 0.5–2.0)

## 2015-04-03 LAB — ABO/RH: ABO/RH(D): A NEG

## 2015-04-03 LAB — PROCALCITONIN: Procalcitonin: 0.14 ng/mL

## 2015-04-03 LAB — SALICYLATE LEVEL
Salicylate Lvl: 22.2 mg/dL (ref 2.8–30.0)
Salicylate Lvl: 14.4 mg/dL (ref 2.8–30.0)

## 2015-04-03 LAB — TRIGLYCERIDES
TRIGLYCERIDES: 236 mg/dL — AB (ref <150)
Triglycerides: 173 mg/dL — ABNORMAL HIGH (ref <150)

## 2015-04-03 MED ORDER — SUCCINYLCHOLINE CHLORIDE 20 MG/ML IJ SOLN
INTRAMUSCULAR | Status: AC | PRN
  Administered 2015-04-03: 100 mg via INTRAVENOUS

## 2015-04-03 MED ORDER — PANTOPRAZOLE SODIUM 40 MG IV SOLR
40.0000 mg | Freq: Every day | INTRAVENOUS | Status: DC
  Administered 2015-04-03 – 2015-04-05 (×3): 40 mg via INTRAVENOUS
  Filled 2015-04-03 (×3): qty 40

## 2015-04-03 MED ORDER — SODIUM CHLORIDE 0.9 % IV BOLUS (SEPSIS)
1000.0000 mL | Freq: Once | INTRAVENOUS | Status: AC
  Administered 2015-04-03: 1000 mL via INTRAVENOUS

## 2015-04-03 MED ORDER — IOHEXOL 350 MG/ML SOLN
80.0000 mL | Freq: Once | INTRAVENOUS | Status: AC | PRN
  Administered 2015-04-03: 80 mL via INTRAVENOUS

## 2015-04-03 MED ORDER — HEPARIN SODIUM (PORCINE) 5000 UNIT/ML IJ SOLN
5000.0000 [IU] | Freq: Three times a day (TID) | INTRAMUSCULAR | Status: DC
  Administered 2015-04-03 – 2015-04-04 (×3): 5000 [IU] via SUBCUTANEOUS
  Filled 2015-04-03 (×4): qty 1

## 2015-04-03 MED ORDER — PANTOPRAZOLE SODIUM 40 MG IV SOLR: 40.0000 mg | Freq: Every day | INTRAVENOUS | Status: DC

## 2015-04-03 MED ORDER — VANCOMYCIN HCL 10 G IV SOLR
1500.0000 mg | Freq: Once | INTRAVENOUS | Status: AC
  Administered 2015-04-03: 1500 mg via INTRAVENOUS
  Filled 2015-04-03: qty 1500

## 2015-04-03 MED ORDER — PROPOFOL 1000 MG/100ML IV EMUL
0.0000 ug/kg/min | INTRAVENOUS | Status: DC
  Administered 2015-04-03: 30 ug/kg/min via INTRAVENOUS
  Administered 2015-04-04 (×2): 20 ug/kg/min via INTRAVENOUS
  Administered 2015-04-04 – 2015-04-06 (×5): 30 ug/kg/min via INTRAVENOUS
  Administered 2015-04-06: 20 ug/kg/min via INTRAVENOUS
  Filled 2015-04-03 (×9): qty 100

## 2015-04-03 MED ORDER — ACETYLCYSTEINE LOAD VIA INFUSION
150.0000 mg/kg | Freq: Once | INTRAVENOUS | Status: AC
  Administered 2015-04-03: 9525 mg via INTRAVENOUS
  Filled 2015-04-03: qty 239

## 2015-04-03 MED ORDER — FENTANYL CITRATE (PF) 100 MCG/2ML IJ SOLN
100.0000 ug | INTRAMUSCULAR | Status: DC | PRN
  Administered 2015-04-04: 100 ug via INTRAVENOUS
  Filled 2015-04-03 (×2): qty 2

## 2015-04-03 MED ORDER — PIPERACILLIN-TAZOBACTAM 3.375 G IVPB 30 MIN
3.3750 g | Freq: Once | INTRAVENOUS | Status: AC
  Administered 2015-04-03: 3.375 g via INTRAVENOUS
  Filled 2015-04-03: qty 50

## 2015-04-03 MED ORDER — PIPERACILLIN-TAZOBACTAM 3.375 G IVPB: 3.3750 g | Freq: Three times a day (TID) | INTRAVENOUS | Status: DC

## 2015-04-03 MED ORDER — MIDAZOLAM HCL 2 MG/2ML IJ SOLN
4.0000 mg | Freq: Once | INTRAMUSCULAR | Status: AC
  Administered 2015-04-03: 4 mg via INTRAVENOUS
  Filled 2015-04-03: qty 4

## 2015-04-03 MED ORDER — DEXTROSE 5 % IV SOLN
15.0000 mg/kg/h | INTRAVENOUS | Status: DC
  Administered 2015-04-03 – 2015-04-06 (×3): 15 mg/kg/h via INTRAVENOUS
  Filled 2015-04-03 (×5): qty 200

## 2015-04-03 MED ORDER — FENTANYL CITRATE (PF) 100 MCG/2ML IJ SOLN
100.0000 ug | INTRAMUSCULAR | Status: DC | PRN
  Administered 2015-04-04 (×5): 100 ug via INTRAVENOUS
  Administered 2015-04-06 (×3): 50 ug via INTRAVENOUS
  Administered 2015-04-06 – 2015-04-07 (×2): 100 ug via INTRAVENOUS
  Administered 2015-04-07: 50 ug via INTRAVENOUS
  Administered 2015-04-07: 100 ug via INTRAVENOUS
  Administered 2015-04-07 (×3): 50 ug via INTRAVENOUS
  Filled 2015-04-03 (×14): qty 2

## 2015-04-03 MED ORDER — SODIUM CHLORIDE 0.9 % IV SOLN: 250.0000 mL | INTRAVENOUS | Status: DC | PRN

## 2015-04-03 MED ORDER — PROPOFOL 1000 MG/100ML IV EMUL: 5.0000 ug/kg/min | INTRAVENOUS | Status: DC

## 2015-04-03 MED ORDER — PIPERACILLIN-TAZOBACTAM 3.375 G IVPB
3.3750 g | Freq: Three times a day (TID) | INTRAVENOUS | Status: DC
  Administered 2015-04-03 – 2015-04-04 (×3): 3.375 g via INTRAVENOUS
  Filled 2015-04-03 (×4): qty 50

## 2015-04-03 MED ORDER — IPRATROPIUM BROMIDE 0.02 % IN SOLN
1.0000 mg | Freq: Once | RESPIRATORY_TRACT | Status: AC
  Administered 2015-04-03: 1 mg via RESPIRATORY_TRACT
  Filled 2015-04-03: qty 5

## 2015-04-03 MED ORDER — FENTANYL CITRATE (PF) 100 MCG/2ML IJ SOLN
100.0000 ug | Freq: Once | INTRAMUSCULAR | Status: AC
  Administered 2015-04-03: 100 ug via INTRAVENOUS
  Filled 2015-04-03: qty 2

## 2015-04-03 MED ORDER — ETOMIDATE 2 MG/ML IV SOLN
INTRAVENOUS | Status: AC | PRN
  Administered 2015-04-03: 20 mg via INTRAVENOUS

## 2015-04-03 MED ORDER — ASPIRIN 81 MG PO CHEW
81.0000 mg | CHEWABLE_TABLET | Freq: Every day | ORAL | Status: DC
  Administered 2015-04-04 – 2015-04-10 (×7): 81 mg
  Filled 2015-04-03 (×7): qty 1

## 2015-04-03 MED ORDER — ALBUTEROL (5 MG/ML) CONTINUOUS INHALATION SOLN
15.0000 mg/h | INHALATION_SOLUTION | Freq: Once | RESPIRATORY_TRACT | Status: AC
  Administered 2015-04-03: 15 mg/h via RESPIRATORY_TRACT
  Filled 2015-04-03: qty 20

## 2015-04-03 MED ORDER — NALOXONE HCL 2 MG/2ML IJ SOSY
2.0000 mg | PREFILLED_SYRINGE | Freq: Once | INTRAMUSCULAR | Status: AC
  Administered 2015-04-03: 2 mg via INTRAVENOUS

## 2015-04-03 MED ORDER — ALBUTEROL SULFATE (2.5 MG/3ML) 0.083% IN NEBU
2.5000 mg | INHALATION_SOLUTION | RESPIRATORY_TRACT | Status: DC | PRN
  Administered 2015-04-07 – 2015-04-10 (×4): 2.5 mg via RESPIRATORY_TRACT
  Filled 2015-04-03 (×3): qty 3

## 2015-04-03 MED ORDER — NALOXONE HCL 2 MG/2ML IJ SOSY
PREFILLED_SYRINGE | INTRAMUSCULAR | Status: AC
  Filled 2015-04-03: qty 2

## 2015-04-03 MED ORDER — FUROSEMIDE 10 MG/ML IJ SOLN: 40.0000 mg | Freq: Once | INTRAMUSCULAR | Status: DC

## 2015-04-03 MED ORDER — IPRATROPIUM-ALBUTEROL 0.5-2.5 (3) MG/3ML IN SOLN
3.0000 mL | Freq: Four times a day (QID) | RESPIRATORY_TRACT | Status: DC
  Administered 2015-04-03 – 2015-04-09 (×24): 3 mL via RESPIRATORY_TRACT
  Filled 2015-04-03 (×25): qty 3

## 2015-04-03 MED ORDER — VANCOMYCIN HCL IN DEXTROSE 750-5 MG/150ML-% IV SOLN
750.0000 mg | Freq: Two times a day (BID) | INTRAVENOUS | Status: DC
  Administered 2015-04-03 – 2015-04-04 (×2): 750 mg via INTRAVENOUS
  Filled 2015-04-03 (×4): qty 150

## 2015-04-03 MED ORDER — SODIUM CHLORIDE 0.9 % IV SOLN
1.0000 g | Freq: Once | INTRAVENOUS | Status: AC
  Administered 2015-04-03: 1 g via INTRAVENOUS
  Filled 2015-04-03: qty 10

## 2015-04-03 MED ORDER — TICAGRELOR 90 MG PO TABS
90.0000 mg | ORAL_TABLET | Freq: Two times a day (BID) | ORAL | Status: DC
  Administered 2015-04-03 – 2015-04-10 (×14): 90 mg
  Filled 2015-04-03 (×22): qty 1

## 2015-04-03 MED ORDER — PROPOFOL 1000 MG/100ML IV EMUL
INTRAVENOUS | Status: AC
  Administered 2015-04-03: 1000 mg
  Filled 2015-04-03: qty 100

## 2015-04-03 MED ORDER — METHYLPREDNISOLONE SODIUM SUCC 40 MG IJ SOLR
40.0000 mg | Freq: Two times a day (BID) | INTRAMUSCULAR | Status: DC
  Administered 2015-04-03 – 2015-04-06 (×6): 40 mg via INTRAVENOUS
  Filled 2015-04-03 (×6): qty 1

## 2015-04-03 MED ORDER — PROPOFOL BOLUS VIA INFUSION
30.0000 mg | Freq: Once | INTRAVENOUS | Status: DC
  Filled 2015-04-03: qty 30

## 2015-04-03 MED ORDER — METHYLPREDNISOLONE SODIUM SUCC 125 MG IJ SOLR
125.0000 mg | Freq: Once | INTRAMUSCULAR | Status: AC
  Administered 2015-04-03: 125 mg via INTRAVENOUS
  Filled 2015-04-03: qty 2

## 2015-04-03 MED ORDER — SODIUM CHLORIDE 0.9 % IV SOLN
INTRAVENOUS | Status: DC
  Administered 2015-04-03: 100 mL/h via INTRAVENOUS
  Administered 2015-04-03 – 2015-04-07 (×4): via INTRAVENOUS

## 2015-04-03 MED ORDER — PIPERACILLIN-TAZOBACTAM 3.375 G IVPB 30 MIN
3.3750 g | Freq: Three times a day (TID) | INTRAVENOUS | Status: DC
  Filled 2015-04-03 (×2): qty 50

## 2015-04-03 NOTE — Progress Notes (Addendum)
Pharmacy Antibiotic Note  [name] is a 65 y.o. male admitted on 04/03/2015 with sepsis/HCAP. Pharmacy has been consulted for vanc/zosyn dosing. Afeb, LA 5.8, wbc wnl. SCr 1 on admit, CrCl~67.   Plan: Zosyn 3.375g IV (64min inf) x 1; then Zosyn 3.375g IV q8h Vanc 1500mg  IV x1; then Vanc 750mg  IV q12h Monitor clinical progress, c/s, renal function, abx plan/LOT VT@SS  as indicated   Height: 5\' 9"  (175.3 cm) Weight: 140 lb (63.504 kg) IBW/kg (Calculated) : 70.7  Temp (24hrs), Avg:97.1 F (36.2 C), Min:97.1 F (36.2 C), Max:97.1 F (36.2 C)   Recent Labs Lab 04/03/15 0811  LATICACIDVEN 5.90*    CrCl cannot be calculated (Patient has no serum creatinine result on file.).    Allergies not on file  Antimicrobials this admission: 3/13 vanc >>  3/13 zosyn >>   Dose adjustments this admission: n/a  Microbiology results: 3/13 BCx:   Elicia Lamp, PharmD, [facility] Clinical Pharmacist Pager [phone] 04/03/2015 8:19 AM
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
## 2015-04-03 NOTE — Progress Notes (Signed)
Attempted insertion of a 12 french foley catheter X 1  without success.  Continues to pass blood from his meatus. MD notified.
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
## 2015-04-03 NOTE — ED Notes (Signed)
Pt placed on cardiac monitor on arrival to treatment room-
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
## 2015-04-03 NOTE — Progress Notes (Signed)
Vent settings changed due to critical ABG. RR now at 24 and Fi02 at 80%. RT will continue to monitor.

## 2015-04-03 NOTE — ED Notes (Signed)
Pt has a chart to be merged-- med hx will merge

## 2015-04-03 NOTE — ED Notes (Signed)
[employer] -- Lupita Leash -- [phone] -- investigating officer

## 2015-04-03 NOTE — Progress Notes (Signed)
UR Completed. Lori Liew, RN, BSN.  [phone] 
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
## 2015-04-03 NOTE — Progress Notes (Signed)
Pharmacy Code Sepsis Protocol  Time of code sepsis page: [phone] [x]  Antibiotics delivered (zosyn prior to code sepsis, vanc at 0847)  Were antibiotics ordered at the time of the code sepsis page? Yes Was it required to contact the physician? [x]  Physician not contacted []  Physician contacted to order antibiotics for code sepsis []  Physician contacted to recommend changing antibiotics  Pharmacy consulted for: vanc/zosyn  Anti-infectives    Start     Dose/Rate Route Frequency Ordered Stop   04/03/15 0830  piperacillin-tazobactam (ZOSYN) IVPB 3.375 g     3.375 g 100 mL/hr over 30 Minutes Intravenous  Once 04/03/15 0820     04/03/15 0830  vancomycin (VANCOCIN) 1,500 mg in sodium chloride 0.9 % 500 mL IVPB     1,500 mg 250 mL/hr over 120 Minutes Intravenous  Once 04/03/15 0820          Nurse education provided: [x]  Minutes left to administer antibiotics to achieve 1 hour goal [x]  Correct order of antibiotic administration [x]  Antibiotic Y-site compatibilities     Elicia Lamp, PharmD, BCPS Clinical Pharmacist Pager [phone] 04/03/2015 8:49 AM
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
## 2015-04-03 NOTE — ED Notes (Signed)
Response from narcan-- pt awake, O2 sats increased to 90

## 2015-04-03 NOTE — Procedures (Signed)
Intubation Procedure Note Taher Wilch [id number] [birth year]  Procedure: Intubation Indications: Respiratory insufficiency  Procedure Details Consent: Unable to obtain consent because of emergent medical necessity. Time Out: Verified patient identification, verified procedure, site/side was marked, verified correct patient position, special equipment/implants available, medications/allergies/relevent history reviewed, required imaging and test results available.  Performed  Maximum sterile technique was used including gloves, hand hygiene and mask.  MAC and 4    Evaluation Hemodynamic Status: BP stable throughout; O2 sats: stable throughout Patient's Current Condition: stable Complications: No apparent complications Patient did tolerate procedure well. Chest X-ray ordered to verify placement.  CXR: pending.   [place] R Nadine Ryle 04/03/2015

## 2015-04-03 NOTE — ED Notes (Addendum)
Foley cath not draining any urine -- bladder scanned for 455cc-- foley d/c'd-- blood clot on end of catheter noted-- temp foley 32fr placed with dark red blood returned. Not draining urine-- Dr. Ree Kida, will leave foley in and transfer to 2106.

## 2015-04-04 ENCOUNTER — Ambulatory Visit: Payer: Medicare PPO | Admitting: Cardiology

## 2015-04-04 ENCOUNTER — Inpatient Hospital Stay (HOSPITAL_COMMUNITY): Payer: Medicare PPO

## 2015-04-04 DIAGNOSIS — T17908A Unspecified foreign body in respiratory tract, part unspecified causing other injury, initial encounter: Secondary | ICD-10-CM | POA: Insufficient documentation

## 2015-04-04 DIAGNOSIS — T17998A Other foreign object in respiratory tract, part unspecified causing other injury, initial encounter: Secondary | ICD-10-CM

## 2015-04-04 DIAGNOSIS — E440 Moderate protein-calorie malnutrition: Secondary | ICD-10-CM | POA: Insufficient documentation

## 2015-04-04 DIAGNOSIS — J9620 Acute and chronic respiratory failure, unspecified whether with hypoxia or hypercapnia: Secondary | ICD-10-CM | POA: Insufficient documentation

## 2015-04-04 LAB — HEPATIC FUNCTION PANEL
ALK PHOS: 55 U/L (ref 38–126)
ALT: 446 U/L — AB (ref 17–63)
AST: 336 U/L — ABNORMAL HIGH (ref 15–41)
Albumin: 2.4 g/dL — ABNORMAL LOW (ref 3.5–5.0)
BILIRUBIN DIRECT: 0.1 mg/dL (ref 0.1–0.5)
BILIRUBIN INDIRECT: 0.5 mg/dL (ref 0.3–0.9)
BILIRUBIN TOTAL: 0.6 mg/dL (ref 0.3–1.2)
Total Protein: 4.9 g/dL — ABNORMAL LOW (ref 6.5–8.1)

## 2015-04-04 LAB — BLOOD GAS, ARTERIAL
Acid-base deficit: 8.8 mmol/L — ABNORMAL HIGH (ref 0.0–2.0)
BICARBONATE: 15.6 meq/L — AB (ref 20.0–24.0)
Drawn by: 437071
FIO2: 0.4
MECHVT: 560 mL
O2 Saturation: 97.1 %
PCO2 ART: 28 mmHg — AB (ref 35.0–45.0)
PEEP/CPAP: 10 cmH2O
PO2 ART: 99.3 mmHg (ref 80.0–100.0)
Patient temperature: 98
RATE: 30 resp/min
TCO2: 16.5 mmol/L (ref 0–100)
pH, Arterial: 7.362 (ref 7.350–7.450)

## 2015-04-04 LAB — BASIC METABOLIC PANEL
Anion gap: 14 (ref 5–15)
BUN: 31 mg/dL — AB (ref 6–20)
CO2: 18 mmol/L — AB (ref 22–32)
Calcium: 7.9 mg/dL — ABNORMAL LOW (ref 8.9–10.3)
Chloride: 106 mmol/L (ref 101–111)
Creatinine, Ser: 1.2 mg/dL (ref 0.61–1.24)
GFR calc Af Amer: >60 mL/min (ref >60)
GLUCOSE: 192 mg/dL — AB (ref 65–99)
POTASSIUM: 3.6 mmol/L (ref 3.5–5.1)
Sodium: 138 mmol/L (ref 135–145)

## 2015-04-04 LAB — MAGNESIUM: MAGNESIUM: 1.3 mg/dL — AB (ref 1.7–2.4)

## 2015-04-04 LAB — PHOSPHORUS: Phosphorus: 3.6 mg/dL (ref 2.5–4.6)

## 2015-04-04 LAB — CBC
HCT: 34.9 % — ABNORMAL LOW (ref 39.0–52.0)
Hemoglobin: 11 g/dL — ABNORMAL LOW (ref 13.0–17.0)
MCH: 31.3 pg (ref 26.0–34.0)
MCHC: 31.5 g/dL (ref 30.0–36.0)
MCV: 99.4 fL (ref 78.0–100.0)
PLATELETS: 101 10*3/uL — AB (ref 150–400)
RBC: 3.51 MIL/uL — AB (ref 4.22–5.81)
RDW: 16.9 % — AB (ref 11.5–15.5)
WBC: 9.7 10*3/uL (ref 4.0–10.5)

## 2015-04-04 LAB — GLUCOSE, CAPILLARY
GLUCOSE-CAPILLARY: 136 mg/dL — AB (ref 65–99)
GLUCOSE-CAPILLARY: 160 mg/dL — AB (ref 65–99)
GLUCOSE-CAPILLARY: 173 mg/dL — AB (ref 65–99)
Glucose-Capillary: 165 mg/dL — ABNORMAL HIGH (ref 65–99)
Glucose-Capillary: 192 mg/dL — ABNORMAL HIGH (ref 65–99)

## 2015-04-04 LAB — PROTIME-INR
INR: 1.82 — ABNORMAL HIGH (ref 0.00–1.49)
Prothrombin Time: 21 seconds — ABNORMAL HIGH (ref 11.6–15.2)

## 2015-04-04 LAB — URINE CULTURE: Culture: NO GROWTH

## 2015-04-04 LAB — PROCALCITONIN: Procalcitonin: 0.76 ng/mL

## 2015-04-04 LAB — FIBRINOGEN: Fibrinogen: 208 mg/dL (ref 204–475)

## 2015-04-04 LAB — ACETAMINOPHEN LEVEL

## 2015-04-04 MED ORDER — VITAL HIGH PROTEIN PO LIQD
1000.0000 mL | ORAL | Status: DC
  Administered 2015-04-04: 1000 mL

## 2015-04-04 MED ORDER — VITAL AF 1.2 CAL PO LIQD
1000.0000 mL | ORAL | Status: DC
  Administered 2015-04-04 – 2015-04-06 (×3): 1000 mL

## 2015-04-04 MED ORDER — ANTISEPTIC ORAL RINSE SOLUTION (CORINZ)
7.0000 mL | Freq: Four times a day (QID) | OROMUCOSAL | Status: DC
  Administered 2015-04-04 – 2015-04-07 (×12): 7 mL via OROMUCOSAL

## 2015-04-04 MED ORDER — PRO-STAT SUGAR FREE PO LIQD
30.0000 mL | Freq: Two times a day (BID) | ORAL | Status: AC
  Administered 2015-04-04 (×2): 30 mL
  Filled 2015-04-04 (×2): qty 30

## 2015-04-04 MED ORDER — AMPICILLIN-SULBACTAM SODIUM 3 (2-1) G IJ SOLR
3.0000 g | Freq: Four times a day (QID) | INTRAMUSCULAR | Status: DC
  Administered 2015-04-04 – 2015-04-10 (×25): 3 g via INTRAVENOUS
  Filled 2015-04-04 (×27): qty 3

## 2015-04-04 MED ORDER — INSULIN ASPART 100 UNIT/ML ~~LOC~~ SOLN
0.0000 [IU] | SUBCUTANEOUS | Status: DC
  Administered 2015-04-04 (×3): 4 [IU] via SUBCUTANEOUS
  Administered 2015-04-05: 3 [IU] via SUBCUTANEOUS
  Administered 2015-04-05: 4 [IU] via SUBCUTANEOUS
  Administered 2015-04-05 (×2): 3 [IU] via SUBCUTANEOUS
  Administered 2015-04-05 – 2015-04-06 (×3): 4 [IU] via SUBCUTANEOUS
  Administered 2015-04-06 (×3): 3 [IU] via SUBCUTANEOUS
  Administered 2015-04-06: 4 [IU] via SUBCUTANEOUS
  Administered 2015-04-07 (×4): 3 [IU] via SUBCUTANEOUS
  Administered 2015-04-08: 2 [IU] via SUBCUTANEOUS
  Administered 2015-04-08: 4 [IU] via SUBCUTANEOUS
  Administered 2015-04-08 – 2015-04-09 (×2): 3 [IU] via SUBCUTANEOUS

## 2015-04-04 MED ORDER — CHLORHEXIDINE GLUCONATE 0.12% ORAL RINSE (MEDLINE KIT)
15.0000 mL | Freq: Two times a day (BID) | OROMUCOSAL | Status: DC
  Administered 2015-04-04 – 2015-04-06 (×6): 15 mL via OROMUCOSAL

## 2015-04-04 NOTE — Progress Notes (Signed)
Pharmacy Antibiotic Note  [name] is a 65 y.o. male admitted on 04/03/2015 with pneumonia.  Pharmacy has been consulted for Unasyn dosing.  Plan: D/c vancomycin and Zosyn Start Unasyn 3 gram q 6 hours Monitor clinical progress, c/s, renal function, abx plan/LOT   Height: 5\' 9"  (175.3 cm) Weight: 168 lb 10.4 oz (76.5 kg) IBW/kg (Calculated) : 70.7  Temp (24hrs), Avg:98.2 F (36.8 C), Min:97.7 F (36.5 C), Max:98.6 F (37 C)   Recent Labs Lab 04/03/15 0801 04/03/15 0811 04/03/15 0814 04/03/15 1104 04/03/15 1123 04/03/15 1446 04/04/15 0210 04/04/15 1032  WBC 9.2  --   --   --   --   --  9.7  --   CREATININE 1.16  --  1.00  --   --   --   --  1.20  LATICACIDVEN  --  5.90*  --  2.1* 2.20* 1.8  --   --     Estimated Creatinine Clearance: 63 mL/min (by C-G formula based on Cr of 1.2).    Allergies  Allergen Reactions  . Codeine     - per wife    Antimicrobials this admission: 3/13 vanc>>3/14 3/13 Zosyn>>3/14 3/14 Unasyn>>  Dose adjustments this admission: none  Microbiology results: 3/13 BC x 2>> ng 3/13 MRSA PCR negative 3/13 Urine cx>> ng   Thank you for allowing [place] to participate in this patients care. Jens Som, PharmD Pager: [phone]  04/04/2015 3:31 PM
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
## 2015-04-05 ENCOUNTER — Inpatient Hospital Stay (HOSPITAL_COMMUNITY): Payer: Medicare PPO

## 2015-04-05 DIAGNOSIS — R7989 Other specified abnormal findings of blood chemistry: Secondary | ICD-10-CM

## 2015-04-05 DIAGNOSIS — E872 Acidosis: Secondary | ICD-10-CM

## 2015-04-05 DIAGNOSIS — I7101 Dissection of thoracic aorta: Secondary | ICD-10-CM

## 2015-04-05 DIAGNOSIS — J189 Pneumonia, unspecified organism: Secondary | ICD-10-CM

## 2015-04-05 LAB — PROCALCITONIN: PROCALCITONIN: 0.49 ng/mL

## 2015-04-05 LAB — COMPREHENSIVE METABOLIC PANEL
ALBUMIN: 2.3 g/dL — AB (ref 3.5–5.0)
ALT: 373 U/L — ABNORMAL HIGH (ref 17–63)
AST: 240 U/L — AB (ref 15–41)
Alkaline Phosphatase: 48 U/L (ref 38–126)
Anion gap: 12 (ref 5–15)
BILIRUBIN TOTAL: 0.7 mg/dL (ref 0.3–1.2)
BUN: 30 mg/dL — AB (ref 6–20)
CALCIUM: 8.1 mg/dL — AB (ref 8.9–10.3)
CHLORIDE: 108 mmol/L (ref 101–111)
CO2: 21 mmol/L — AB (ref 22–32)
CREATININE: 0.88 mg/dL (ref 0.61–1.24)
GFR calc Af Amer: >60 mL/min (ref >60)
GFR calc non Af Amer: >60 mL/min (ref >60)
GLUCOSE: 149 mg/dL — AB (ref 65–99)
POTASSIUM: 2.9 mmol/L — AB (ref 3.5–5.1)
Sodium: 141 mmol/L (ref 135–145)
Total Protein: 4.5 g/dL — ABNORMAL LOW (ref 6.5–8.1)

## 2015-04-05 LAB — GLUCOSE, CAPILLARY
GLUCOSE-CAPILLARY: 133 mg/dL — AB (ref 65–99)
Glucose-Capillary: 149 mg/dL — ABNORMAL HIGH (ref 65–99)
Glucose-Capillary: 149 mg/dL — ABNORMAL HIGH (ref 65–99)
Glucose-Capillary: 151 mg/dL — ABNORMAL HIGH (ref 65–99)
Glucose-Capillary: 163 mg/dL — ABNORMAL HIGH (ref 65–99)

## 2015-04-05 LAB — CBC WITH DIFFERENTIAL/PLATELET
BASOS PCT: 0 %
Basophils Absolute: 0 10*3/uL (ref 0.0–0.1)
EOS ABS: 0 10*3/uL (ref 0.0–0.7)
Eosinophils Relative: 0 %
HEMATOCRIT: 29.2 % — AB (ref 39.0–52.0)
HEMOGLOBIN: 9.5 g/dL — AB (ref 13.0–17.0)
LYMPHS ABS: 0.4 10*3/uL — AB (ref 0.7–4.0)
Lymphocytes Relative: 4 %
MCH: 31.7 pg (ref 26.0–34.0)
MCHC: 32.5 g/dL (ref 30.0–36.0)
MCV: 97.3 fL (ref 78.0–100.0)
MONO ABS: 0.3 10*3/uL (ref 0.1–1.0)
MONOS PCT: 3 %
NEUTROS ABS: 9 10*3/uL — AB (ref 1.7–7.7)
Neutrophils Relative %: 93 %
Platelets: 77 10*3/uL — ABNORMAL LOW (ref 150–400)
RBC: 3 MIL/uL — ABNORMAL LOW (ref 4.22–5.81)
RDW: 17.3 % — AB (ref 11.5–15.5)
WBC: 9.7 10*3/uL (ref 4.0–10.5)

## 2015-04-05 LAB — PROTIME-INR
INR: 1.52 — AB (ref 0.00–1.49)
Prothrombin Time: 18.3 seconds — ABNORMAL HIGH (ref 11.6–15.2)

## 2015-04-05 MED ORDER — POTASSIUM CHLORIDE 20 MEQ/15ML (10%) PO SOLN
ORAL | Status: AC
  Filled 2015-04-05: qty 30

## 2015-04-05 MED ORDER — POTASSIUM CHLORIDE 10 MEQ/100ML IV SOLN
INTRAVENOUS | Status: AC
  Filled 2015-04-05: qty 100

## 2015-04-05 MED ORDER — FUROSEMIDE 10 MG/ML IJ SOLN
INTRAMUSCULAR | Status: AC
  Filled 2015-04-05: qty 4

## 2015-04-05 MED ORDER — POTASSIUM CHLORIDE 20 MEQ/15ML (10%) PO SOLN
40.0000 meq | Freq: Once | ORAL | Status: AC
Start: 2015-04-05 — End: 2015-04-05
  Administered 2015-04-05: 40 meq via ORAL

## 2015-04-05 MED ORDER — FUROSEMIDE 10 MG/ML IJ SOLN
20.0000 mg | Freq: Two times a day (BID) | INTRAMUSCULAR | Status: DC
  Administered 2015-04-05 – 2015-04-06 (×2): 20 mg via INTRAVENOUS
  Filled 2015-04-05 (×2): qty 2

## 2015-04-05 MED ORDER — POTASSIUM CHLORIDE 10 MEQ/100ML IV SOLN
10.0000 meq | INTRAVENOUS | Status: AC
  Administered 2015-04-05 (×4): 10 meq via INTRAVENOUS
  Filled 2015-04-05 (×2): qty 100

## 2015-04-05 MED ORDER — POTASSIUM CHLORIDE 20 MEQ/15ML (10%) PO SOLN
40.0000 meq | Freq: Once | ORAL | Status: AC
  Administered 2015-04-05: 40 meq via ORAL
  Filled 2015-04-05: qty 30

## 2015-04-05 MED ORDER — METOPROLOL TARTRATE 25 MG PO TABS
25.0000 mg | ORAL_TABLET | Freq: Two times a day (BID) | ORAL | Status: DC
  Administered 2015-04-05 – 2015-04-06 (×3): 25 mg via ORAL
  Filled 2015-04-05 (×4): qty 1

## 2015-04-05 MED ORDER — POTASSIUM CHLORIDE CRYS ER 20 MEQ PO TBCR: 40.0000 meq | EXTENDED_RELEASE_TABLET | Freq: Once | ORAL | Status: DC
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
## 2015-04-05 NOTE — Progress Notes (Signed)
[place] Progress Note Patient Name: [name] DOB: [birth year] MRN: [id number]   Date of Service  04/05/2015  HPI/Events of Note  K=2.9  eICU Interventions  Replaced 17meq PO and 40 meq IV     Intervention Category Major Interventions: Electrolyte abnormality - evaluation and management  [name] 04/05/2015, 6:28 AM

## 2015-04-06 ENCOUNTER — Inpatient Hospital Stay (HOSPITAL_COMMUNITY): Payer: Medicare PPO

## 2015-04-06 DIAGNOSIS — J9600 Acute respiratory failure, unspecified whether with hypoxia or hypercapnia: Secondary | ICD-10-CM

## 2015-04-06 LAB — GLUCOSE, CAPILLARY
GLUCOSE-CAPILLARY: 124 mg/dL — AB (ref 65–99)
GLUCOSE-CAPILLARY: 135 mg/dL — AB (ref 65–99)
GLUCOSE-CAPILLARY: 159 mg/dL — AB (ref 65–99)
GLUCOSE-CAPILLARY: 164 mg/dL — AB (ref 65–99)
GLUCOSE-CAPILLARY: 169 mg/dL — AB (ref 65–99)
Glucose-Capillary: 151 mg/dL — ABNORMAL HIGH (ref 65–99)
Glucose-Capillary: 134 mg/dL — ABNORMAL HIGH (ref 65–99)
Glucose-Capillary: 107 mg/dL — ABNORMAL HIGH (ref 65–99)

## 2015-04-06 LAB — CBC WITH DIFFERENTIAL/PLATELET
Basophils Absolute: 0 10*3/uL (ref 0.0–0.1)
Basophils Relative: 0 %
Eosinophils Absolute: 0 10*3/uL (ref 0.0–0.7)
Eosinophils Relative: 0 %
HEMATOCRIT: 30.3 % — AB (ref 39.0–52.0)
HEMOGLOBIN: 10.1 g/dL — AB (ref 13.0–17.0)
LYMPHS ABS: 0.5 10*3/uL — AB (ref 0.7–4.0)
Lymphocytes Relative: 3 %
MCH: 32.6 pg (ref 26.0–34.0)
MCHC: 33.3 g/dL (ref 30.0–36.0)
MCV: 97.7 fL (ref 78.0–100.0)
MONO ABS: 0.6 10*3/uL (ref 0.1–1.0)
MONOS PCT: 5 %
NEUTROS ABS: 12.6 10*3/uL — AB (ref 1.7–7.7)
NEUTROS PCT: 92 %
Platelets: 57 10*3/uL — ABNORMAL LOW (ref 150–400)
RBC: 3.1 MIL/uL — ABNORMAL LOW (ref 4.22–5.81)
RDW: 18.6 % — AB (ref 11.5–15.5)
WBC: 13.6 10*3/uL — ABNORMAL HIGH (ref 4.0–10.5)

## 2015-04-06 LAB — COMPREHENSIVE METABOLIC PANEL
ALK PHOS: 53 U/L (ref 38–126)
ALT: 389 U/L — ABNORMAL HIGH (ref 17–63)
ANION GAP: 11 (ref 5–15)
AST: 203 U/L — ABNORMAL HIGH (ref 15–41)
Albumin: 2.4 g/dL — ABNORMAL LOW (ref 3.5–5.0)
BILIRUBIN TOTAL: 1.5 mg/dL — AB (ref 0.3–1.2)
BUN: 25 mg/dL — ABNORMAL HIGH (ref 6–20)
CALCIUM: 8.4 mg/dL — AB (ref 8.9–10.3)
CO2: 25 mmol/L (ref 22–32)
Chloride: 108 mmol/L (ref 101–111)
Creatinine, Ser: 0.81 mg/dL (ref 0.61–1.24)
Glucose, Bld: 156 mg/dL — ABNORMAL HIGH (ref 65–99)
Potassium: 3.9 mmol/L (ref 3.5–5.1)
Sodium: 144 mmol/L (ref 135–145)
TOTAL PROTEIN: 4.8 g/dL — AB (ref 6.5–8.1)

## 2015-04-06 LAB — ECHOCARDIOGRAM COMPLETE
Height: 69 in
Weight: 2680.79 oz

## 2015-04-06 LAB — TRIGLYCERIDES: TRIGLYCERIDES: 76 mg/dL (ref <150)

## 2015-04-06 MED ORDER — FUROSEMIDE 10 MG/ML IJ SOLN
60.0000 mg | Freq: Three times a day (TID) | INTRAMUSCULAR | Status: DC
  Administered 2015-04-06 – 2015-04-07 (×3): 60 mg via INTRAVENOUS
  Filled 2015-04-06 (×4): qty 6

## 2015-04-06 MED ORDER — LABETALOL HCL 5 MG/ML IV SOLN
10.0000 mg | Freq: Once | INTRAVENOUS | Status: AC
  Administered 2015-04-06: 10 mg via INTRAVENOUS

## 2015-04-06 MED ORDER — PANTOPRAZOLE SODIUM 40 MG PO TBEC
40.0000 mg | DELAYED_RELEASE_TABLET | Freq: Every day | ORAL | Status: DC
  Administered 2015-04-06 – 2015-04-09 (×4): 40 mg via ORAL
  Filled 2015-04-06 (×4): qty 1

## 2015-04-06 MED ORDER — METOPROLOL TARTRATE 50 MG PO TABS
50.0000 mg | ORAL_TABLET | Freq: Two times a day (BID) | ORAL | Status: DC
  Administered 2015-04-06 – 2015-04-10 (×8): 50 mg via ORAL
  Filled 2015-04-06 (×10): qty 1

## 2015-04-06 MED ORDER — METHYLPREDNISOLONE SODIUM SUCC 40 MG IJ SOLR
20.0000 mg | INTRAMUSCULAR | Status: DC
  Administered 2015-04-07: 20 mg via INTRAVENOUS
  Filled 2015-04-06: qty 1
  Filled 2015-04-06: qty 0.5

## 2015-04-06 MED ORDER — LABETALOL HCL 5 MG/ML IV SOLN
INTRAVENOUS | Status: AC
  Filled 2015-04-06: qty 4

## 2015-04-06 NOTE — Progress Notes (Signed)
Poison control center called for update.

## 2015-04-06 NOTE — Progress Notes (Signed)
  Echocardiogram 2D Echocardiogram has been performed.  Darlina Sicilian M 04/06/2015, 8:49 AM
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
## 2015-04-06 NOTE — Progress Notes (Signed)
eLink Physician-Brief Progress Note Patient Name: Virginia Harleman DOB: 06/15/1950 MRN: [id number]   Date of Service  04/06/2015  HPI/Events of Note  Extubated, doing OK  eICU Interventions  Regular diet     Intervention Category Minor Interventions: Routine modifications to care plan (e.g. PRN medications for pain, fever)  Simonne Maffucci 04/06/2015, 7:34 PM

## 2015-04-06 NOTE — Progress Notes (Signed)
UR Completed. Bj Morlock, RN, BSN.  [phone] 

## 2015-04-06 NOTE — Procedures (Signed)
Extubation Procedure Note  Patient Details:   Name: [name] DOB: [birth year] MRN: [id number]   Airway Documentation:     Evaluation  O2 sats: stable throughout Complications: No apparent complications Patient did tolerate procedure well. Bilateral Breath Sounds: Clear, Diminished Suctioning: Oral, Airway Yes   Patient extubated to 2L nasal cannula per MD order.  Positive cuff leak noted.  No evidence of stridor.  Patient able to speak post extubation.  Sats currently 96%.  Vitals are stable.  No apparent complications.   Alphia Moh N 04/06/2015, 1:05 PM

## 2015-04-07 LAB — BASIC METABOLIC PANEL
Anion gap: 20 — ABNORMAL HIGH (ref 5–15)
BUN: 25 mg/dL — AB (ref 6–20)
CALCIUM: 8.4 mg/dL — AB (ref 8.9–10.3)
CO2: 28 mmol/L (ref 22–32)
CREATININE: 0.66 mg/dL (ref 0.61–1.24)
Chloride: 95 mmol/L — ABNORMAL LOW (ref 101–111)
GFR calc non Af Amer: >60 mL/min (ref >60)
GLUCOSE: 70 mg/dL (ref 65–99)
Potassium: 4.3 mmol/L (ref 3.5–5.1)
Sodium: 143 mmol/L (ref 135–145)

## 2015-04-07 LAB — HEPATIC FUNCTION PANEL
ALBUMIN: 2.8 g/dL — AB (ref 3.5–5.0)
ALK PHOS: 64 U/L (ref 38–126)
ALT: 370 U/L — ABNORMAL HIGH (ref 17–63)
AST: 180 U/L — ABNORMAL HIGH (ref 15–41)
BILIRUBIN INDIRECT: 1.7 mg/dL — AB (ref 0.3–0.9)
Bilirubin, Direct: 0.8 mg/dL — ABNORMAL HIGH (ref 0.1–0.5)
TOTAL PROTEIN: 5.2 g/dL — AB (ref 6.5–8.1)
Total Bilirubin: 2.5 mg/dL — ABNORMAL HIGH (ref 0.3–1.2)

## 2015-04-07 LAB — GLUCOSE, CAPILLARY
GLUCOSE-CAPILLARY: 101 mg/dL — AB (ref 65–99)
GLUCOSE-CAPILLARY: 122 mg/dL — AB (ref 65–99)
GLUCOSE-CAPILLARY: 122 mg/dL — AB (ref 65–99)
GLUCOSE-CAPILLARY: 132 mg/dL — AB (ref 65–99)
GLUCOSE-CAPILLARY: 132 mg/dL — AB (ref 65–99)
Glucose-Capillary: 81 mg/dL (ref 65–99)
Glucose-Capillary: 134 mg/dL — ABNORMAL HIGH (ref 65–99)
Glucose-Capillary: 78 mg/dL (ref 65–99)

## 2015-04-07 LAB — PHOSPHORUS: Phosphorus: 1.5 mg/dL — ABNORMAL LOW (ref 2.5–4.6)

## 2015-04-07 LAB — MAGNESIUM: Magnesium: 1.4 mg/dL — ABNORMAL LOW (ref 1.7–2.4)

## 2015-04-07 MED ORDER — ONDANSETRON HCL 4 MG/2ML IJ SOLN
4.0000 mg | Freq: Four times a day (QID) | INTRAMUSCULAR | Status: DC | PRN
  Administered 2015-04-07 – 2015-04-09 (×4): 4 mg via INTRAVENOUS
  Filled 2015-04-07 (×5): qty 2

## 2015-04-07 MED ORDER — VITAMIN B-1 100 MG PO TABS
100.0000 mg | ORAL_TABLET | Freq: Every day | ORAL | Status: DC
  Administered 2015-04-07 – 2015-04-10 (×4): 100 mg via ORAL
  Filled 2015-04-07 (×4): qty 1

## 2015-04-07 MED ORDER — FUROSEMIDE 10 MG/ML IJ SOLN: 60.0000 mg | Freq: Every day | INTRAMUSCULAR | Status: DC

## 2015-04-07 MED ORDER — NICOTINE 21 MG/24HR TD PT24
21.0000 mg | MEDICATED_PATCH | Freq: Every day | TRANSDERMAL | Status: DC
  Administered 2015-04-07 – 2015-04-09 (×3): 21 mg via TRANSDERMAL
  Filled 2015-04-07 (×4): qty 1

## 2015-04-07 MED ORDER — METHADONE HCL 10 MG PO TABS
30.0000 mg | ORAL_TABLET | Freq: Every day | ORAL | Status: DC
  Administered 2015-04-07: 30 mg via ORAL
  Filled 2015-04-07: qty 3

## 2015-04-07 MED ORDER — SERTRALINE HCL 50 MG PO TABS
50.0000 mg | ORAL_TABLET | Freq: Two times a day (BID) | ORAL | Status: DC
  Administered 2015-04-07 – 2015-04-10 (×7): 50 mg via ORAL
  Filled 2015-04-07 (×8): qty 1

## 2015-04-07 MED ORDER — FOLIC ACID 1 MG PO TABS
1.0000 mg | ORAL_TABLET | Freq: Every day | ORAL | Status: DC
  Administered 2015-04-07 – 2015-04-10 (×4): 1 mg via ORAL
  Filled 2015-04-07 (×4): qty 1

## 2015-04-07 MED ORDER — OXYCODONE HCL 5 MG PO TABS
10.0000 mg | ORAL_TABLET | Freq: Once | ORAL | Status: AC
  Administered 2015-04-07: 10 mg via ORAL
  Filled 2015-04-07: qty 2

## 2015-04-07 NOTE — Consult Note (Signed)
SLP Cancellation Note  Unable to complete BSE at this time, as pt is currently with PT. Will continue efforts.   Celia B. Quentin Ore [facility], [place] [phone]

## 2015-04-07 NOTE — Progress Notes (Signed)
Pt stated he was a pt at [facility]. Case Manager aware and pt was able to call and contact [facility] to let them know that he was currently in the hospital.

## 2015-04-07 NOTE — Consult Note (Signed)
SLP Cancellation Note  Pt now available, but is currently reporting nausea and declines swallow evaluation at this time. RN indicates pt has diet ordered, no difficulty with liquids. ST to follow up for assessment as pt tolerates. RN aware.  Celia B. Quentin Ore [facility], [place] [phone]
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
## 2015-04-07 NOTE — Progress Notes (Signed)
Report called to RN. Pt transferred to 5W via wheelchair with personal belongings. Pts wife called and updated about transfer.

## 2015-04-07 NOTE — Progress Notes (Signed)
Spoke with [employer] center in [place]. Patient last received methadone 30 mg on 04/03/15  Levester Fresh, PharmD, BCPS, [facility] Clinical Pharmacist Pager [phone] 04/07/2015 11:21 AM

## 2015-04-07 NOTE — Progress Notes (Signed)
Spoke with [employer]. Poison Control to sign off as of 04/07/15 at 1030.

## 2015-04-08 ENCOUNTER — Encounter (HOSPITAL_COMMUNITY): Payer: Self-pay | Admitting: Surgery

## 2015-04-08 DIAGNOSIS — T17998S Other foreign object in respiratory tract, part unspecified causing other injury, sequela: Secondary | ICD-10-CM

## 2015-04-08 LAB — GLUCOSE, CAPILLARY
GLUCOSE-CAPILLARY: 193 mg/dL — AB (ref 65–99)
Glucose-Capillary: 125 mg/dL — ABNORMAL HIGH (ref 65–99)
Glucose-Capillary: 72 mg/dL (ref 65–99)
Glucose-Capillary: 99 mg/dL (ref 65–99)
Glucose-Capillary: 129 mg/dL — ABNORMAL HIGH (ref 65–99)

## 2015-04-08 LAB — CULTURE, BLOOD (ROUTINE X 2)
Culture: NO GROWTH
Culture: NO GROWTH

## 2015-04-08 LAB — COMPREHENSIVE METABOLIC PANEL
ALK PHOS: 52 U/L (ref 38–126)
ALT: 218 U/L — ABNORMAL HIGH (ref 17–63)
AST: 53 U/L — AB (ref 15–41)
Albumin: 2.5 g/dL — ABNORMAL LOW (ref 3.5–5.0)
Anion gap: 10 (ref 5–15)
BILIRUBIN TOTAL: 1.4 mg/dL — AB (ref 0.3–1.2)
BUN: 23 mg/dL — AB (ref 6–20)
CALCIUM: 7.9 mg/dL — AB (ref 8.9–10.3)
CO2: 36 mmol/L — ABNORMAL HIGH (ref 22–32)
Chloride: 92 mmol/L — ABNORMAL LOW (ref 101–111)
Creatinine, Ser: 0.62 mg/dL (ref 0.61–1.24)
GFR calc Af Amer: >60 mL/min (ref >60)
Glucose, Bld: 60 mg/dL — ABNORMAL LOW (ref 65–99)
POTASSIUM: 2.4 mmol/L — AB (ref 3.5–5.1)
Sodium: 138 mmol/L (ref 135–145)
TOTAL PROTEIN: 4.7 g/dL — AB (ref 6.5–8.1)

## 2015-04-08 LAB — BLOOD PRODUCT ORDER (VERBAL) VERIFICATION

## 2015-04-08 LAB — MAGNESIUM: MAGNESIUM: 1.3 mg/dL — AB (ref 1.7–2.4)

## 2015-04-08 MED ORDER — METHADONE HCL 10 MG PO TABS
30.0000 mg | ORAL_TABLET | Freq: Every day | ORAL | Status: DC
Start: 2015-04-09 — End: 2015-04-10
  Administered 2015-04-09 – 2015-04-10 (×2): 30 mg via ORAL
  Filled 2015-04-08 (×2): qty 3

## 2015-04-08 MED ORDER — MAGNESIUM SULFATE 2 GM/50ML IV SOLN
2.0000 g | Freq: Once | INTRAVENOUS | Status: AC
  Administered 2015-04-08: 2 g via INTRAVENOUS
  Filled 2015-04-08: qty 50

## 2015-04-08 MED ORDER — METHOCARBAMOL 1000 MG/10ML IJ SOLN
500.0000 mg | Freq: Three times a day (TID) | INTRAVENOUS | Status: DC | PRN
  Filled 2015-04-08: qty 5

## 2015-04-08 MED ORDER — POTASSIUM CHLORIDE CRYS ER 20 MEQ PO TBCR
40.0000 meq | EXTENDED_RELEASE_TABLET | Freq: Three times a day (TID) | ORAL | Status: DC
  Administered 2015-04-08 (×3): 40 meq via ORAL
  Filled 2015-04-08 (×3): qty 2

## 2015-04-08 MED ORDER — OXYCODONE HCL 5 MG PO TABS
15.0000 mg | ORAL_TABLET | ORAL | Status: DC | PRN
  Administered 2015-04-08 – 2015-04-10 (×12): 15 mg via ORAL
  Filled 2015-04-08 (×12): qty 3

## 2015-04-08 NOTE — Progress Notes (Signed)
On-call MD paged regarding phlebitis of the right anterior forearm. The swelling has subsided with application of heat, however, the bruising seems to run up the vein from just above the wrist into the antecubital. More heat applied, patient still complaining of burning at the site. Will continue to monitor.

## 2015-04-08 NOTE — Progress Notes (Addendum)
Patient awoke at 0315 complaining of burning pain at a previous IV site. Upon assessment, the patient seems to have phlebitis of the right anterior forearm. Site presents as warm, bruised, and raised and feels firm to the touch. Heat was applied. Will continue to monitor.

## 2015-04-08 NOTE — Progress Notes (Addendum)
Patient found to be bleeding from his urethra. C/o pain with urination. Foley catheter was removed earlier today. On-call MD notified. Will continue to monitor and treat per orders.

## 2015-04-08 NOTE — Progress Notes (Signed)
CRITICAL VALUE ALERT  Critical value received:  K+ 2.4  Date of notification:  04/08/15  Time of notification:  907  Critical value read back:Yes.    Nurse who received alert:  Joslyn Hy   MD notified (1st page):  abrol  Time of first page:  913  MD notified (2nd page):  Time of second page:  Responding MD:  abrol  Time MD responded:  915

## 2015-04-08 NOTE — Progress Notes (Signed)
Scheduled neb not given, NT is in the bathroom with patient cleaning him up. PRN neb will be given IF needed for SOB/wheezing.
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
## 2015-04-09 LAB — COMPREHENSIVE METABOLIC PANEL
ALBUMIN: 2.4 g/dL — AB (ref 3.5–5.0)
ALT: 163 U/L — ABNORMAL HIGH (ref 17–63)
ANION GAP: 12 (ref 5–15)
AST: 35 U/L (ref 15–41)
Alkaline Phosphatase: 50 U/L (ref 38–126)
BUN: 21 mg/dL — AB (ref 6–20)
CHLORIDE: 93 mmol/L — AB (ref 101–111)
CO2: 33 mmol/L — AB (ref 22–32)
Calcium: 8.2 mg/dL — ABNORMAL LOW (ref 8.9–10.3)
Creatinine, Ser: 0.64 mg/dL (ref 0.61–1.24)
GFR calc Af Amer: >60 mL/min (ref >60)
GFR calc non Af Amer: >60 mL/min (ref >60)
GLUCOSE: 97 mg/dL (ref 65–99)
POTASSIUM: 3.3 mmol/L — AB (ref 3.5–5.1)
SODIUM: 138 mmol/L (ref 135–145)
Total Bilirubin: 1.2 mg/dL (ref 0.3–1.2)
Total Protein: 4.7 g/dL — ABNORMAL LOW (ref 6.5–8.1)

## 2015-04-09 LAB — GLUCOSE, CAPILLARY
GLUCOSE-CAPILLARY: 120 mg/dL — AB (ref 65–99)
GLUCOSE-CAPILLARY: 130 mg/dL — AB (ref 65–99)
GLUCOSE-CAPILLARY: 62 mg/dL — AB (ref 65–99)
GLUCOSE-CAPILLARY: 77 mg/dL (ref 65–99)
GLUCOSE-CAPILLARY: 77 mg/dL (ref 65–99)
Glucose-Capillary: 108 mg/dL — ABNORMAL HIGH (ref 65–99)
Glucose-Capillary: 98 mg/dL (ref 65–99)

## 2015-04-09 LAB — CBC
HEMATOCRIT: 30.4 % — AB (ref 39.0–52.0)
HEMOGLOBIN: 9.7 g/dL — AB (ref 13.0–17.0)
MCH: 32.1 pg (ref 26.0–34.0)
MCHC: 31.9 g/dL (ref 30.0–36.0)
MCV: 100.7 fL — AB (ref 78.0–100.0)
Platelets: 32 10*3/uL — ABNORMAL LOW (ref 150–400)
RBC: 3.02 MIL/uL — ABNORMAL LOW (ref 4.22–5.81)
RDW: 18.9 % — AB (ref 11.5–15.5)
WBC: 8.3 10*3/uL (ref 4.0–10.5)

## 2015-04-09 LAB — HEPATITIS PANEL, ACUTE
HCV Ab: 0.1 s/co ratio (ref 0.0–0.9)
HEP B C IGM: NEGATIVE
HEP B S AG: NEGATIVE
Hep A IgM: NEGATIVE

## 2015-04-09 LAB — TRIGLYCERIDES: TRIGLYCERIDES: 116 mg/dL (ref <150)

## 2015-04-09 LAB — MAGNESIUM: MAGNESIUM: 1.8 mg/dL (ref 1.7–2.4)

## 2015-04-09 MED ORDER — POTASSIUM CHLORIDE CRYS ER 20 MEQ PO TBCR
40.0000 meq | EXTENDED_RELEASE_TABLET | Freq: Three times a day (TID) | ORAL | Status: AC
  Administered 2015-04-09 – 2015-04-10 (×4): 40 meq via ORAL
  Filled 2015-04-09 (×4): qty 2

## 2015-04-09 MED ORDER — IPRATROPIUM-ALBUTEROL 0.5-2.5 (3) MG/3ML IN SOLN
3.0000 mL | Freq: Two times a day (BID) | RESPIRATORY_TRACT | Status: DC
  Administered 2015-04-10: 3 mL via RESPIRATORY_TRACT
  Filled 2015-04-09: qty 3

## 2015-04-09 MED ORDER — MAGNESIUM OXIDE 400 (241.3 MG) MG PO TABS
800.0000 mg | ORAL_TABLET | Freq: Every day | ORAL | Status: DC
  Administered 2015-04-09: 800 mg via ORAL
  Filled 2015-04-09: qty 2
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
## 2015-04-09 NOTE — Progress Notes (Addendum)
Hypoglycemic Event  CBG: 62 Treatment: 15 GM carbohydrate snack Symptoms: None Follow-up CBG: Time: 0100 CBG Result: 77 Possible Reasons for Event: Unknown MD notified  Alphia Kava

## 2015-04-09 NOTE — Progress Notes (Addendum)
On-call MD paged 3 times with no response. Pt stable in NAD. Pt reassured that we will be monitoring him carefully. Will continue to assess and monitor.

## 2015-04-09 NOTE — Progress Notes (Addendum)
Patient has finally voided since bleeding started. He states he didn't want to try to urinate due to the pain. He has started bleeding again since. Will notify the MD and continue to monitor.

## 2015-04-10 ENCOUNTER — Encounter (HOSPITAL_COMMUNITY): Payer: Self-pay | Admitting: *Deleted

## 2015-04-10 LAB — CBC
HCT: 32.4 % — ABNORMAL LOW (ref 39.0–52.0)
Hemoglobin: 10.5 g/dL — ABNORMAL LOW (ref 13.0–17.0)
MCH: 33.1 pg (ref 26.0–34.0)
MCHC: 32.4 g/dL (ref 30.0–36.0)
MCV: 102.2 fL — AB (ref 78.0–100.0)
PLATELETS: 49 10*3/uL — AB (ref 150–400)
RBC: 3.17 MIL/uL — ABNORMAL LOW (ref 4.22–5.81)
RDW: 19 % — AB (ref 11.5–15.5)
WBC: 8.4 10*3/uL (ref 4.0–10.5)

## 2015-04-10 LAB — GLUCOSE, CAPILLARY
GLUCOSE-CAPILLARY: 93 mg/dL (ref 65–99)
GLUCOSE-CAPILLARY: 123 mg/dL — AB (ref 65–99)
Glucose-Capillary: 102 mg/dL — ABNORMAL HIGH (ref 65–99)
Glucose-Capillary: 97 mg/dL (ref 65–99)

## 2015-04-10 LAB — COMPREHENSIVE METABOLIC PANEL
ALBUMIN: 2.6 g/dL — AB (ref 3.5–5.0)
ALT: 131 U/L — ABNORMAL HIGH (ref 17–63)
AST: 32 U/L (ref 15–41)
Alkaline Phosphatase: 54 U/L (ref 38–126)
Anion gap: 10 (ref 5–15)
BUN: 11 mg/dL (ref 6–20)
CHLORIDE: 98 mmol/L — AB (ref 101–111)
CO2: 28 mmol/L (ref 22–32)
Calcium: 8.4 mg/dL — ABNORMAL LOW (ref 8.9–10.3)
Creatinine, Ser: 0.77 mg/dL (ref 0.61–1.24)
GFR calc Af Amer: >60 mL/min (ref >60)
Glucose, Bld: 116 mg/dL — ABNORMAL HIGH (ref 65–99)
POTASSIUM: 4.2 mmol/L (ref 3.5–5.1)
SODIUM: 136 mmol/L (ref 135–145)
Total Bilirubin: 1.3 mg/dL — ABNORMAL HIGH (ref 0.3–1.2)
Total Protein: 5.3 g/dL — ABNORMAL LOW (ref 6.5–8.1)

## 2015-04-10 LAB — MAGNESIUM: MAGNESIUM: 1.6 mg/dL — AB (ref 1.7–2.4)

## 2015-04-10 MED ORDER — ONDANSETRON HCL 4 MG/2ML IJ SOLN: 4.0000 mg | Freq: Four times a day (QID) | INTRAMUSCULAR | Status: DC | PRN

## 2015-04-10 MED ORDER — POTASSIUM CHLORIDE CRYS ER 20 MEQ PO TBCR: 40.0000 meq | EXTENDED_RELEASE_TABLET | Freq: Two times a day (BID) | ORAL | Status: DC

## 2015-04-10 MED ORDER — NICOTINE 21 MG/24HR TD PT24: 21.0000 mg | MEDICATED_PATCH | Freq: Every day | TRANSDERMAL | Status: DC

## 2015-04-10 MED ORDER — ASPIRIN 81 MG PO CHEW: 81.0000 mg | CHEWABLE_TABLET | Freq: Every day | ORAL | Status: DC

## 2015-04-10 MED ORDER — FOLIC ACID 1 MG PO TABS: 1.0000 mg | ORAL_TABLET | Freq: Every day | ORAL | Status: DC

## 2015-04-10 MED ORDER — PANTOPRAZOLE SODIUM 40 MG PO TBEC: 40.0000 mg | DELAYED_RELEASE_TABLET | Freq: Every day | ORAL | Status: AC

## 2015-04-10 MED ORDER — MAGNESIUM OXIDE 400 (241.3 MG) MG PO TABS: 800.0000 mg | ORAL_TABLET | Freq: Two times a day (BID) | ORAL | Status: DC

## 2015-04-10 MED ORDER — OXYCODONE HCL 30 MG PO TABS: 15.0000 mg | ORAL_TABLET | ORAL | Status: DC | PRN

## 2015-04-10 MED ORDER — SILVER SULFADIAZINE 1 % EX CREA
TOPICAL_CREAM | Freq: Two times a day (BID) | CUTANEOUS | Status: DC
  Administered 2015-04-10 (×2): via TOPICAL
  Filled 2015-04-10: qty 85

## 2015-04-10 MED ORDER — MAGNESIUM OXIDE 400 (241.3 MG) MG PO TABS
800.0000 mg | ORAL_TABLET | Freq: Two times a day (BID) | ORAL | Status: DC
  Administered 2015-04-10: 800 mg via ORAL
  Filled 2015-04-10: qty 2

## 2015-04-10 MED ORDER — AMOXICILLIN-POT CLAVULANATE 875-125 MG PO TABS: 1.0000 | ORAL_TABLET | Freq: Two times a day (BID) | ORAL | Status: DC

## 2015-04-10 MED ORDER — THIAMINE HCL 100 MG PO TABS: 100.0000 mg | ORAL_TABLET | Freq: Every day | ORAL | Status: DC

## 2015-04-10 NOTE — Care Management Important Message (Signed)
Important Message  Patient Details  Name: [name] MRN: [id number] Date of Birth: Apr 02, 1950   Medicare Important Message Given:  Yes    Ayano Douthitt P Jahzier Villalon 04/10/2015, 3:59 PM

## 2015-04-10 NOTE — Progress Notes (Addendum)
Patient c/o pain in right foot. Upon assessment, a 6cm x 5cm x 0.5cm (height) serous blister on the right heel was noted. Wound is clean, dry, and intact. Foam was placed and heel is floated. Will continue to monitor.
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
## 2015-04-10 NOTE — Progress Notes (Signed)
CSW met with pt to determine disposition- pt wants to go home and get things in order but is nervous since he is so weak at this time.  CSW discussed options with pt- pt would like to go home and see how he does at home with wife support but would like [place] to work on Insurance underwriter auth in case he is not doing well at home  [facility] is agreeable and has started Josem Kaufmann- will inform pt either way of insurance decision.  Pt to DC home today  Domenica Reamer, [place] Social Worker [phone]

## 2015-04-10 NOTE — Progress Notes (Signed)
[name] to be D/C'd Home with [facility] per MD order.  Discussed with the patient and all questions fully answered.  VSS, Skin clean, dry and intact without evidence of skin break down, no evidence of skin tears noted. IV catheter discontinued intact. Site without signs and symptoms of complications. Dressing and pressure applied.  An After Visit Summary was printed and given to the patient. Patient received prescription.  D/c education completed with patient/family including follow up instructions, medication list, d/c activities limitations if indicated, with other d/c instructions as indicated by MD - patient able to verbalize understanding, all questions fully answered.   Patient instructed to return to ED, call 911, or call MD for any changes in condition.   Patient escorted via [place], and D/C home via private auto.  [name] 04/10/2015 1:25 PM

## 2015-04-10 NOTE — Progress Notes (Signed)
Pharmacist Provided - Patient Medication Education Prior to Discharge   [name] is an 65 y.o. male who presented to [facility] on 04/03/2015 with a chief complaint of  Chief Complaint  Patient presents with  . level 2    . Marine scientist  . Code Sepsis     [x]  Patient will be discharged with 1 new medications []  Patient being discharged without any new medications  The following medications were discussed with the patient: Augmentin  Pain Control medications: []  Yes    [x]  No  Diabetes Medications: []  Yes    [x]  No  Heart Failure Medications: []  Yes    [x]  No  Anticoagulation Medications:  []  Yes    [x]  No  Antibiotics at discharge: [x]  Yes    []  No  Allergy Assessment Completed and Updated: [x]  Yes    []  No Identified Patient Allergies:  Allergies  Allergen Reactions  . Codeine     - per wife     Medication Adherence Assessment: []  Excellent (no doses missed/week)      [x]  Good (1 dose missed/week)      []  Partial (2-3 doses missed/week)      []  Poor (>3 doses missed/week)  Barriers to Obtaining Medications: []  Yes [x]  No   Assessment: I reviewed his new Augmentin prescription with him, and let him know that he didn't need to start taking it until tonight, but that starting tomorrow it is a twice per day medication. I discussed side effects, especially diarrhea, and let him know he could use Imodium if he needed to. He states he has used that in the past and it has helped. I answered his questions and his wife's questions. I think he will be successful at home with this new medication, especially given that he has the help of his wife and home health nurses.   Time spent preparing for discharge counseling: 10 min Time spent counseling patient: 15 min  Governor Specking, PharmD Clinical Pharmacy Resident Pager: [phone]  04/10/2015, 1:29 PM
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
## 2015-04-10 NOTE — Progress Notes (Signed)
Patient went to get scripts filled at the [facility] in [place], [place] but MD ordered Zofran injection.  Paged Dr to get the script rewritten for oral Zofran,  Awaiting a response.  Will call [place] [phone]

## 2015-04-11 NOTE — NC FL2 (Signed)
[place] LEVEL OF CARE SCREENING TOOL     IDENTIFICATION  Patient Name: [name] Birthdate: October 21, 1950 Sex: male Admission Date (Current Location): 04/03/2015  [facility] and [place] Number:  Herbalist and Address:  [facility], [place] [address] [place], [place] [address]      Provider Number: [id number]  Attending Physician Name and Address:  No att. providers found  Relative Name and Phone Number:  Terri Piedra, spouse, [phone]    Current Level of Care: Hospital Recommended Level of Care: [place] Prior Approval Number:    Date Approved/Denied:   PASRR Number:    Discharge Plan: SNF    Current Diagnoses: Patient Active Problem List   Diagnosis Date Noted  . Malnutrition of moderate degree 04/04/2015  . Acute respiratory failure with hypoxia ([place])   . Aspiration into airway   . Respiratory failure with hypoxia ([place]) 04/03/2015  . Descending thoracic dissection ([place])   . Elevated troponin   . HCAP (healthcare-associated pneumonia)   . Lactic acidosis   . Tylenol toxicity   . Faintness   . Chest pain at rest 03/23/2015  . NSTEMI (non-ST elevated myocardial infarction) ([place])   . Chest pain 03/14/2015  . Elevated troponin 03/14/2015  . Acute myocardial infarction, subendocardial infarction, subsequent episode of care ([place]) 10/26/2014  . Angina pectoris ([place]) 10/26/2014  . COPD mixed type ([place]) 10/26/2014  . Arteriosclerosis of autologous vein coronary artery bypass graft 10/26/2014  . Arteriosclerosis of coronary artery 10/26/2014  . H/O adenomatous polyp of colon 10/26/2014  . Hypercholesterolemia without hypertriglyceridemia 10/26/2014  . LBP (low back pain) 10/26/2014  . Tobacco abuse 10/26/2014  . Depression 04/05/2013  . Cardiac tamponade   . Aortic dissection ([place])   . HTN (hypertension)   . CAD (coronary artery disease)   . Hypercholesterolemia   . Chronic back pain   . Schatzki's ring      Orientation RESPIRATION BLADDER Height & Weight     Self, Time, Situation, Place  O2 (2L) Continent, Indwelling catheter (urinary catheter) Weight: 128 lb 15.5 oz (58.5 kg) Height:  5\' 9"  (175.3 cm)  BEHAVIORAL SYMPTOMS/MOOD NEUROLOGICAL BOWEL NUTRITION STATUS      Continent  (Please see DC summary)  AMBULATORY STATUS COMMUNICATION OF NEEDS Skin   Extensive Assist Verbally Normal                       Personal Care Assistance Level of Assistance  Bathing, Feeding, Dressing Bathing Assistance: Maximum assistance Feeding assistance: Limited assistance Dressing Assistance: Limited assistance     Functional Limitations Info             SPECIAL CARE FACTORS FREQUENCY  PT (By licensed PT)                    Contractures      Additional Factors Info  Code Status, Allergies Code Status Info: Full Allergies Info: Codeine           Current Medications (04/11/2015):  This is the current hospital active medication list No current facility-administered medications for this encounter.   Current Outpatient Prescriptions  Medication Sig Dispense Refill  . albuterol (PROVENTIL HFA;VENTOLIN HFA) 108 (90 Base) MCG/ACT inhaler Inhale 2 puffs into the lungs every 4 (four) hours as needed for wheezing or shortness of breath.    . methadone (DOLOPHINE) 10 MG tablet Take 30 mg by mouth daily.    . metoprolol  tartrate (LOPRESSOR) 25 MG tablet Take 25 mg by mouth 2 (two) times daily.    . pantoprazole (PROTONIX) 40 MG tablet Take 40 mg by mouth 2 (two) times daily.    . sertraline (ZOLOFT) 50 MG tablet Take 50 mg by mouth 2 (two) times daily.    . ticagrelor (BRILINTA) 90 MG TABS tablet Take 90 mg by mouth 2 (two) times daily.    Marland Kitchen amLODipine (NORVASC) 5 MG tablet Take 1 tablet (5 mg total) by mouth daily. 30 tablet 11  . amoxicillin-clavulanate (AUGMENTIN) 875-125 MG tablet Take 1 tablet by mouth 2 (two) times daily. 8 tablet 0  . aspirin 81 MG chewable tablet Chew 1  tablet (81 mg total) by mouth daily. 30 tablet   . aspirin 81 MG chewable tablet Chew 1 tablet (81 mg total) by mouth daily. 30 tablet 0  . atorvastatin (LIPITOR) 80 MG tablet Take 1 tablet (80 mg total) by mouth daily at 6 PM. 30 tablet 11  . cyclobenzaprine (FLEXERIL) 10 MG tablet Take 1 tablet (10 mg total) by mouth 2 (two) times daily as needed for muscle spasms. (Patient not taking: Reported on 03/14/2015) 20 tablet 0  . folic acid (FOLVITE) 1 MG tablet Take 1 tablet (1 mg total) by mouth daily. 30 tablet 1  . isosorbide mononitrate (IMDUR) 60 MG 24 hr tablet TAKE ONE TABLET BY MOUTH TWICE DAILY 60 tablet 5  . magnesium oxide (MAG-OX) 400 (241.3 Mg) MG tablet Take 2 tablets (800 mg total) by mouth 2 (two) times daily. 60 tablet 0  . metoprolol tartrate (LOPRESSOR) 25 MG tablet TAKE ONE TABLET BY MOUTH TWICE DAILY 180 tablet 3  . nicotine (NICODERM CQ - DOSED IN MG/24 HOURS) 14 mg/24hr patch Place 1 patch (14 mg total) onto the skin daily. (Patient not taking: Reported on 03/23/2015) 28 patch 0  . nicotine (NICODERM CQ - DOSED IN MG/24 HOURS) 21 mg/24hr patch Place 1 patch (21 mg total) onto the skin daily. 28 patch 0  . nitroGLYCERIN (NITROSTAT) 0.4 MG SL tablet Place 0.4 mg under the tongue every 5 (five) minutes as needed. Chest pain    . ondansetron (ZOFRAN) 4 MG/2ML SOLN injection Inject 2 mLs (4 mg total) into the vein every 6 (six) hours as needed for nausea or vomiting. 2 mL 0  . oxycodone (ROXICODONE) 30 MG immediate release tablet Take 0.5 tablets (15 mg total) by mouth every 4 (four) hours as needed for pain. 1 tablet 0  . pantoprazole (PROTONIX) 40 MG tablet Take 40 mg by mouth 2 (two) times daily.     . pantoprazole (PROTONIX) 40 MG tablet Take 1 tablet (40 mg total) by mouth at bedtime. 30 tablet 0  . potassium chloride SA (K-DUR,KLOR-CON) 20 MEQ tablet Take 2 tablets (40 mEq total) by mouth 2 (two) times daily. 60 tablet 0  . PROAIR HFA 108 (90 BASE) MCG/ACT inhaler Take 2 puffs by  mouth every 6 (six) hours as needed for wheezing. Lung disease    . sertraline (ZOLOFT) 50 MG tablet Take 50 mg by mouth 2 (two) times daily.    Marland Kitchen thiamine 100 MG tablet Take 1 tablet (100 mg total) by mouth daily. 30 tablet 0  . ticagrelor (BRILINTA) 90 MG TABS tablet Take 1 tablet (90 mg total) by mouth 2 (two) times daily. 60 tablet 11   Facility-Administered Medications Ordered in Other Encounters  Medication Dose Route Frequency Provider Last Rate Last Dose  . vancomycin (VANCOCIN) 1,500 mg in  sodium chloride 0.9 % 500 mL IVPB  1,500 mg Intravenous Once Sanjuana Kava, MD         Discharge Medications: Please see discharge summary for a list of discharge medications.  Relevant Imaging Results:  Relevant Lab Results:   Additional Information SS#: [id number]  [name], [place]

## 2015-04-12 ENCOUNTER — Encounter: Payer: Self-pay | Admitting: Cardiology

## 2015-04-13 ENCOUNTER — Telehealth: Payer: Self-pay | Admitting: Cardiology

## 2015-04-13 NOTE — Telephone Encounter (Signed)
Thank you for update. I hope he gets better soon. Candee Furbish, MD

## 2015-04-13 NOTE — Telephone Encounter (Signed)
New Message  Pt wife wanted to inform RN that pt is in the hospital- [facility]

## 2015-04-13 NOTE — Telephone Encounter (Signed)
Noted - will forward to Dr Skains for his knowledge. 

## 2015-05-11 ENCOUNTER — Emergency Department (HOSPITAL_COMMUNITY): Payer: Medicare PPO

## 2015-05-11 ENCOUNTER — Observation Stay (HOSPITAL_COMMUNITY)
Admission: EM | Admit: 2015-05-11 | Discharge: 2015-05-16 | Disposition: A | Discharge status: Home / Self Care | Payer: Medicare PPO | Attending: Internal Medicine | Admitting: Internal Medicine

## 2015-05-11 ENCOUNTER — Encounter (HOSPITAL_COMMUNITY): Payer: Self-pay

## 2015-05-11 DIAGNOSIS — L89102 Pressure ulcer of unspecified part of back, stage 2: Secondary | ICD-10-CM | POA: Diagnosis present

## 2015-05-11 DIAGNOSIS — R531 Weakness: Secondary | ICD-10-CM | POA: Diagnosis not present

## 2015-05-11 DIAGNOSIS — E860 Dehydration: Secondary | ICD-10-CM | POA: Diagnosis not present

## 2015-05-11 DIAGNOSIS — F419 Anxiety disorder, unspecified: Secondary | ICD-10-CM | POA: Diagnosis not present

## 2015-05-11 DIAGNOSIS — Z720 Tobacco use: Secondary | ICD-10-CM

## 2015-05-11 DIAGNOSIS — Z951 Presence of aortocoronary bypass graft: Secondary | ICD-10-CM | POA: Diagnosis not present

## 2015-05-11 DIAGNOSIS — Z79899 Other long term (current) drug therapy: Secondary | ICD-10-CM | POA: Insufficient documentation

## 2015-05-11 DIAGNOSIS — E440 Moderate protein-calorie malnutrition: Secondary | ICD-10-CM | POA: Diagnosis present

## 2015-05-11 DIAGNOSIS — E039 Hypothyroidism, unspecified: Secondary | ICD-10-CM | POA: Diagnosis not present

## 2015-05-11 DIAGNOSIS — I7101 Dissection of thoracic aorta: Secondary | ICD-10-CM | POA: Diagnosis not present

## 2015-05-11 DIAGNOSIS — G8929 Other chronic pain: Secondary | ICD-10-CM | POA: Insufficient documentation

## 2015-05-11 DIAGNOSIS — I252 Old myocardial infarction: Secondary | ICD-10-CM | POA: Insufficient documentation

## 2015-05-11 DIAGNOSIS — K219 Gastro-esophageal reflux disease without esophagitis: Secondary | ICD-10-CM | POA: Diagnosis not present

## 2015-05-11 DIAGNOSIS — M549 Dorsalgia, unspecified: Secondary | ICD-10-CM | POA: Insufficient documentation

## 2015-05-11 DIAGNOSIS — I951 Orthostatic hypotension: Secondary | ICD-10-CM | POA: Diagnosis not present

## 2015-05-11 DIAGNOSIS — R410 Disorientation, unspecified: Secondary | ICD-10-CM | POA: Diagnosis not present

## 2015-05-11 DIAGNOSIS — R4182 Altered mental status, unspecified: Principal | ICD-10-CM | POA: Insufficient documentation

## 2015-05-11 DIAGNOSIS — N179 Acute kidney failure, unspecified: Secondary | ICD-10-CM | POA: Insufficient documentation

## 2015-05-11 DIAGNOSIS — I2510 Atherosclerotic heart disease of native coronary artery without angina pectoris: Secondary | ICD-10-CM | POA: Insufficient documentation

## 2015-05-11 DIAGNOSIS — Z7902 Long term (current) use of antithrombotics/antiplatelets: Secondary | ICD-10-CM | POA: Insufficient documentation

## 2015-05-11 DIAGNOSIS — G4700 Insomnia, unspecified: Secondary | ICD-10-CM | POA: Diagnosis not present

## 2015-05-11 DIAGNOSIS — I10 Essential (primary) hypertension: Secondary | ICD-10-CM | POA: Diagnosis present

## 2015-05-11 DIAGNOSIS — F329 Major depressive disorder, single episode, unspecified: Secondary | ICD-10-CM | POA: Diagnosis present

## 2015-05-11 DIAGNOSIS — J449 Chronic obstructive pulmonary disease, unspecified: Secondary | ICD-10-CM | POA: Insufficient documentation

## 2015-05-11 DIAGNOSIS — I7100 Dissection of unspecified site of aorta: Secondary | ICD-10-CM | POA: Diagnosis not present

## 2015-05-11 DIAGNOSIS — G9340 Encephalopathy, unspecified: Secondary | ICD-10-CM | POA: Diagnosis not present

## 2015-05-11 DIAGNOSIS — F32A Depression, unspecified: Secondary | ICD-10-CM | POA: Diagnosis present

## 2015-05-11 DIAGNOSIS — F17210 Nicotine dependence, cigarettes, uncomplicated: Secondary | ICD-10-CM | POA: Diagnosis present

## 2015-05-11 DIAGNOSIS — Z7982 Long term (current) use of aspirin: Secondary | ICD-10-CM | POA: Insufficient documentation

## 2015-05-11 DIAGNOSIS — E785 Hyperlipidemia, unspecified: Secondary | ICD-10-CM | POA: Insufficient documentation

## 2015-05-11 DIAGNOSIS — E43 Unspecified severe protein-calorie malnutrition: Secondary | ICD-10-CM | POA: Diagnosis not present

## 2015-05-11 HISTORY — DX: Gastro-esophageal reflux disease without esophagitis: K21.9

## 2015-05-11 HISTORY — DX: Major depressive disorder, single episode, unspecified: F32.9

## 2015-05-11 HISTORY — DX: Pneumonia, unspecified organism: J18.9

## 2015-05-11 HISTORY — DX: Anxiety disorder, unspecified: F41.9

## 2015-05-11 HISTORY — DX: Metabolic encephalopathy: G93.41

## 2015-05-11 HISTORY — DX: Depression, unspecified: F32.A

## 2015-05-11 HISTORY — DX: Constipation, unspecified: K59.00

## 2015-05-11 HISTORY — DX: Opioid abuse, uncomplicated: F11.10

## 2015-05-11 HISTORY — DX: Dysphagia, oropharyngeal phase: R13.12

## 2015-05-11 HISTORY — DX: Alcohol abuse, uncomplicated: F10.10

## 2015-05-11 HISTORY — DX: Anemia, unspecified: D64.9

## 2015-05-11 LAB — I-STAT TROPONIN, ED: TROPONIN I, POC: 0.05 ng/mL (ref 0.00–0.08)

## 2015-05-11 LAB — DIFFERENTIAL
BASOS ABS: 0 10*3/uL (ref 0.0–0.1)
BASOS PCT: 1 %
Eosinophils Absolute: 0 10*3/uL (ref 0.0–0.7)
Eosinophils Relative: 1 %
Lymphocytes Relative: 21 %
Lymphs Abs: 1.7 10*3/uL (ref 0.7–4.0)
MONOS PCT: 7 %
Monocytes Absolute: 0.6 10*3/uL (ref 0.1–1.0)
NEUTROS ABS: 5.6 10*3/uL (ref 1.7–7.7)
Neutrophils Relative %: 70 %

## 2015-05-11 LAB — URINALYSIS, ROUTINE W REFLEX MICROSCOPIC
Bilirubin Urine: NEGATIVE
Glucose, UA: NEGATIVE mg/dL
HGB URINE DIPSTICK: NEGATIVE
Ketones, ur: NEGATIVE mg/dL
Leukocytes, UA: NEGATIVE
Nitrite: NEGATIVE
PH: 5 (ref 5.0–8.0)
Protein, ur: NEGATIVE mg/dL
SPECIFIC GRAVITY, URINE: 1.013 (ref 1.005–1.030)

## 2015-05-11 LAB — MAGNESIUM: Magnesium: 1.8 mg/dL (ref 1.7–2.4)

## 2015-05-11 LAB — COMPREHENSIVE METABOLIC PANEL
ALBUMIN: 3.3 g/dL — AB (ref 3.5–5.0)
ALT: 14 U/L — ABNORMAL LOW (ref 17–63)
AST: 27 U/L (ref 15–41)
Alkaline Phosphatase: 67 U/L (ref 38–126)
Anion gap: 10 (ref 5–15)
BUN: 19 mg/dL (ref 6–20)
CHLORIDE: 103 mmol/L (ref 101–111)
CO2: 20 mmol/L — AB (ref 22–32)
Calcium: 9.1 mg/dL (ref 8.9–10.3)
Creatinine, Ser: 1.25 mg/dL — ABNORMAL HIGH (ref 0.61–1.24)
GFR calc Af Amer: >60 mL/min (ref >60)
GFR calc non Af Amer: 60 mL/min — ABNORMAL LOW (ref >60)
GLUCOSE: 122 mg/dL — AB (ref 65–99)
POTASSIUM: 4.5 mmol/L (ref 3.5–5.1)
SODIUM: 133 mmol/L — AB (ref 135–145)
Total Bilirubin: 0.6 mg/dL (ref 0.3–1.2)
Total Protein: 6.9 g/dL (ref 6.5–8.1)

## 2015-05-11 LAB — RAPID URINE DRUG SCREEN, HOSP PERFORMED
AMPHETAMINES: NOT DETECTED
BENZODIAZEPINES: NOT DETECTED
Barbiturates: NOT DETECTED
Cocaine: NOT DETECTED
OPIATES: POSITIVE — AB
Tetrahydrocannabinol: NOT DETECTED

## 2015-05-11 LAB — I-STAT CG4 LACTIC ACID, ED: Lactic Acid, Venous: 0.92 mmol/L (ref 0.5–2.0)

## 2015-05-11 LAB — CBC
HEMATOCRIT: 28.2 % — AB (ref 39.0–52.0)
Hemoglobin: 9.5 g/dL — ABNORMAL LOW (ref 13.0–17.0)
MCH: 32.8 pg (ref 26.0–34.0)
MCHC: 33.7 g/dL (ref 30.0–36.0)
MCV: 97.2 fL (ref 78.0–100.0)
Platelets: 365 10*3/uL (ref 150–400)
RBC: 2.9 MIL/uL — ABNORMAL LOW (ref 4.22–5.81)
RDW: 15.7 % — AB (ref 11.5–15.5)
WBC: 8.2 10*3/uL (ref 4.0–10.5)

## 2015-05-11 LAB — CBG MONITORING, ED: Glucose-Capillary: 123 mg/dL — ABNORMAL HIGH (ref 65–99)

## 2015-05-11 LAB — TSH: TSH: 15.193 u[IU]/mL — AB (ref 0.350–4.500)

## 2015-05-11 LAB — SALICYLATE LEVEL: Salicylate Lvl: <4 mg/dL (ref 2.8–30.0)

## 2015-05-11 LAB — ETHANOL: Alcohol, Ethyl (B): <5 mg/dL (ref <5)

## 2015-05-11 LAB — ACETAMINOPHEN LEVEL: Acetaminophen (Tylenol), Serum: <10 ug/mL — ABNORMAL LOW (ref 10–30)

## 2015-05-11 LAB — AMMONIA: Ammonia: 19 umol/L (ref 9–35)

## 2015-05-11 LAB — PHOSPHORUS: Phosphorus: 2.6 mg/dL (ref 2.5–4.6)

## 2015-05-11 MED ORDER — SODIUM CHLORIDE 0.9 % IV BOLUS (SEPSIS)
1000.0000 mL | Freq: Once | INTRAVENOUS | Status: AC
  Administered 2015-05-11: 1000 mL via INTRAVENOUS

## 2015-05-11 MED ORDER — RISPERIDONE 0.25 MG PO TABS
0.2500 mg | ORAL_TABLET | Freq: Two times a day (BID) | ORAL | Status: DC
  Administered 2015-05-11 – 2015-05-12 (×2): 0.25 mg via ORAL
  Filled 2015-05-11 (×3): qty 1

## 2015-05-11 MED ORDER — FOLIC ACID 1 MG PO TABS
1.0000 mg | ORAL_TABLET | Freq: Every day | ORAL | Status: DC
  Administered 2015-05-12 – 2015-05-16 (×5): 1 mg via ORAL
  Filled 2015-05-11 (×5): qty 1

## 2015-05-11 MED ORDER — OXYCODONE HCL 5 MG PO TABS
5.0000 mg | ORAL_TABLET | Freq: Three times a day (TID) | ORAL | Status: DC | PRN
Start: 2015-05-11 — End: 2015-05-12
  Administered 2015-05-11 – 2015-05-12 (×2): 5 mg via ORAL
  Filled 2015-05-11 (×2): qty 1

## 2015-05-11 MED ORDER — ACETAMINOPHEN 325 MG PO TABS
650.0000 mg | ORAL_TABLET | Freq: Three times a day (TID) | ORAL | Status: DC | PRN
  Administered 2015-05-12 – 2015-05-16 (×6): 650 mg via ORAL
  Filled 2015-05-11 (×6): qty 2

## 2015-05-11 MED ORDER — ENSURE ENLIVE PO LIQD
237.0000 mL | Freq: Two times a day (BID) | ORAL | Status: DC
  Administered 2015-05-12 – 2015-05-16 (×9): 237 mL via ORAL

## 2015-05-11 MED ORDER — IPRATROPIUM-ALBUTEROL 0.5-2.5 (3) MG/3ML IN SOLN
3.0000 mL | Freq: Four times a day (QID) | RESPIRATORY_TRACT | Status: DC | PRN
  Administered 2015-05-15 – 2015-05-16 (×2): 3 mL via RESPIRATORY_TRACT
  Filled 2015-05-11 (×3): qty 3

## 2015-05-11 MED ORDER — PANTOPRAZOLE SODIUM 40 MG PO TBEC
40.0000 mg | DELAYED_RELEASE_TABLET | Freq: Two times a day (BID) | ORAL | Status: DC
  Administered 2015-05-11 – 2015-05-16 (×10): 40 mg via ORAL
  Filled 2015-05-11 (×13): qty 1

## 2015-05-11 MED ORDER — TICAGRELOR 90 MG PO TABS
90.0000 mg | ORAL_TABLET | Freq: Two times a day (BID) | ORAL | Status: DC
  Administered 2015-05-11 – 2015-05-16 (×10): 90 mg via ORAL
  Filled 2015-05-11 (×12): qty 1

## 2015-05-11 MED ORDER — METOPROLOL TARTRATE 25 MG PO TABS
25.0000 mg | ORAL_TABLET | Freq: Two times a day (BID) | ORAL | Status: DC
  Administered 2015-05-12 – 2015-05-15 (×6): 25 mg via ORAL
  Filled 2015-05-11 (×9): qty 1

## 2015-05-11 MED ORDER — ATORVASTATIN CALCIUM 20 MG PO TABS
20.0000 mg | ORAL_TABLET | Freq: Every day | ORAL | Status: DC
  Administered 2015-05-11 – 2015-05-16 (×6): 20 mg via ORAL
  Filled 2015-05-11 (×6): qty 1

## 2015-05-11 MED ORDER — ACETAMINOPHEN ER 650 MG PO TBCR: 650.0000 mg | EXTENDED_RELEASE_TABLET | Freq: Three times a day (TID) | ORAL | Status: DC | PRN

## 2015-05-11 MED ORDER — VITAMIN B-1 100 MG PO TABS
100.0000 mg | ORAL_TABLET | Freq: Every day | ORAL | Status: DC
  Administered 2015-05-12 – 2015-05-16 (×5): 100 mg via ORAL
  Filled 2015-05-11 (×5): qty 1

## 2015-05-11 MED ORDER — HEPARIN SODIUM (PORCINE) 5000 UNIT/ML IJ SOLN
5000.0000 [IU] | Freq: Three times a day (TID) | INTRAMUSCULAR | Status: DC
  Administered 2015-05-11 – 2015-05-13 (×5): 5000 [IU] via SUBCUTANEOUS
  Filled 2015-05-11 (×8): qty 1

## 2015-05-11 MED ORDER — MAGNESIUM OXIDE 400 (241.3 MG) MG PO TABS
800.0000 mg | ORAL_TABLET | Freq: Two times a day (BID) | ORAL | Status: DC
  Administered 2015-05-11 – 2015-05-16 (×10): 800 mg via ORAL
  Filled 2015-05-11 (×11): qty 2

## 2015-05-11 MED ORDER — POTASSIUM CHLORIDE CRYS ER 20 MEQ PO TBCR
40.0000 meq | EXTENDED_RELEASE_TABLET | Freq: Every day | ORAL | Status: DC
  Administered 2015-05-12 – 2015-05-16 (×5): 40 meq via ORAL
  Filled 2015-05-11 (×5): qty 2

## 2015-05-11 MED ORDER — GUAIFENESIN-DM 100-10 MG/5ML PO SYRP
10.0000 mL | ORAL_SOLUTION | ORAL | Status: DC | PRN
  Administered 2015-05-11: 10 mL via ORAL
  Filled 2015-05-11: qty 10

## 2015-05-11 MED ORDER — SODIUM CHLORIDE 0.9 % IV SOLN
INTRAVENOUS | Status: AC
  Administered 2015-05-11 – 2015-05-12 (×2): via INTRAVENOUS

## 2015-05-11 MED ORDER — ASPIRIN 81 MG PO CHEW
81.0000 mg | CHEWABLE_TABLET | Freq: Every day | ORAL | Status: DC
  Administered 2015-05-12 – 2015-05-16 (×5): 81 mg via ORAL
  Filled 2015-05-11 (×5): qty 1

## 2015-05-11 MED ORDER — NICOTINE 21 MG/24HR TD PT24
21.0000 mg | MEDICATED_PATCH | Freq: Every day | TRANSDERMAL | Status: DC
Start: 2015-05-12 — End: 2015-05-16
  Administered 2015-05-12 – 2015-05-16 (×5): 21 mg via TRANSDERMAL
  Filled 2015-05-11 (×5): qty 1

## 2015-05-11 NOTE — ED Notes (Signed)
Pt called out stating that he needs to leave.  When this RN entered the room, Pt sts "I need to go take care of my family.  I need to get my wife to her appointment at 0830.  I only kept my appointment here (Pt began mumbling)."  Pt informed that is was 3:35pm.  Pt continues to ask why he is here and state that he needs to leave soon.

## 2015-05-11 NOTE — ED Notes (Signed)
Reminded pt of need for urine multiple times

## 2015-05-11 NOTE — ED Notes (Signed)
Pt made aware that we have ordered him a dinner tray.  Per request, Pt given his brown shirt.  Pt requesting to go outside for a minute and again, to feed his animals.  Attempted to redirect Pt.  Pt remains upset and unable to redirect.

## 2015-05-11 NOTE — ED Notes (Signed)
Pt is continually getting out of bed. When attempts are made to redirect pt the pt becomes angry and gets verbally abusive. He states he will " be leavening at 5:30 no matter what. You can't stop me." attempts have been made to explain to pt that he is being admitted to hospital.
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
## 2015-05-11 NOTE — ED Notes (Signed)
Hospitalist at bedside 

## 2015-05-11 NOTE — ED Notes (Signed)
While this RN was putting blood into tubes, Pt asked for me to give him "some peanut butter," while pointing at the wall.  No peanut in the room.  When asked the Pt to clarify where he was seeing peanut butter, Pt directed this RN to the glove boxes.

## 2015-05-11 NOTE — ED Notes (Signed)
Pt given crackers per DR Laurena Spies
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
## 2015-05-11 NOTE — ED Notes (Signed)
Per [employer], Pt, from [place], c/o increased confusion and AMS x 1 day.  Denies pain.  Facility reports the Pt is typically A & Ox4.  EMS sts "he's talking off the wall."

## 2015-05-12 DIAGNOSIS — I7101 Dissection of thoracic aorta: Secondary | ICD-10-CM | POA: Diagnosis not present

## 2015-05-12 DIAGNOSIS — N179 Acute kidney failure, unspecified: Secondary | ICD-10-CM | POA: Diagnosis not present

## 2015-05-12 DIAGNOSIS — R4182 Altered mental status, unspecified: Secondary | ICD-10-CM | POA: Diagnosis not present

## 2015-05-12 DIAGNOSIS — F332 Major depressive disorder, recurrent severe without psychotic features: Secondary | ICD-10-CM

## 2015-05-12 DIAGNOSIS — M549 Dorsalgia, unspecified: Secondary | ICD-10-CM | POA: Diagnosis not present

## 2015-05-12 LAB — BASIC METABOLIC PANEL
Anion gap: 9 (ref 5–15)
BUN: 15 mg/dL (ref 6–20)
CHLORIDE: 109 mmol/L (ref 101–111)
CO2: 21 mmol/L — AB (ref 22–32)
CREATININE: 0.84 mg/dL (ref 0.61–1.24)
Calcium: 9.2 mg/dL (ref 8.9–10.3)
GFR calc Af Amer: >60 mL/min (ref >60)
GFR calc non Af Amer: >60 mL/min (ref >60)
Glucose, Bld: 102 mg/dL — ABNORMAL HIGH (ref 65–99)
Potassium: 4.4 mmol/L (ref 3.5–5.1)
Sodium: 139 mmol/L (ref 135–145)

## 2015-05-12 LAB — RPR: RPR: NONREACTIVE

## 2015-05-12 LAB — VITAMIN B12: VITAMIN B 12: 700 pg/mL (ref 180–914)

## 2015-05-12 LAB — HIV ANTIBODY (ROUTINE TESTING W REFLEX): HIV Screen 4th Generation wRfx: NONREACTIVE

## 2015-05-12 MED ORDER — LORAZEPAM 2 MG/ML IJ SOLN: 1.0000 mg | Freq: Four times a day (QID) | INTRAMUSCULAR | Status: DC | PRN

## 2015-05-12 MED ORDER — OXYCODONE HCL 5 MG PO TABS
10.0000 mg | ORAL_TABLET | Freq: Four times a day (QID) | ORAL | Status: DC | PRN
  Administered 2015-05-12: 10 mg via ORAL
  Filled 2015-05-12: qty 2

## 2015-05-12 MED ORDER — LORAZEPAM 1 MG PO TABS
1.0000 mg | ORAL_TABLET | Freq: Four times a day (QID) | ORAL | Status: DC | PRN
  Filled 2015-05-12: qty 1

## 2015-05-12 MED ORDER — OXYCODONE HCL 5 MG PO TABS
10.0000 mg | ORAL_TABLET | Freq: Three times a day (TID) | ORAL | Status: DC | PRN
  Administered 2015-05-12 – 2015-05-14 (×4): 10 mg via ORAL
  Filled 2015-05-12 (×5): qty 2

## 2015-05-12 MED ORDER — ONDANSETRON HCL 4 MG/2ML IJ SOLN: 4.0000 mg | Freq: Four times a day (QID) | INTRAMUSCULAR | Status: DC | PRN

## 2015-05-12 MED ORDER — DICLOFENAC EPOLAMINE 1.3 % TD PTCH
1.0000 | MEDICATED_PATCH | Freq: Two times a day (BID) | TRANSDERMAL | Status: DC
  Administered 2015-05-12 – 2015-05-16 (×8): 1 via TRANSDERMAL
  Filled 2015-05-12 (×14): qty 1

## 2015-05-12 MED ORDER — LORAZEPAM 1 MG PO TABS: 0.0000 mg | ORAL_TABLET | Freq: Two times a day (BID) | ORAL | Status: DC

## 2015-05-12 MED ORDER — LEVOTHYROXINE SODIUM 88 MCG PO TABS
88.0000 ug | ORAL_TABLET | Freq: Every day | ORAL | Status: DC
  Administered 2015-05-13 – 2015-05-16 (×4): 88 ug via ORAL
  Filled 2015-05-12 (×5): qty 1

## 2015-05-12 MED ORDER — ARIPIPRAZOLE 5 MG PO TABS
5.0000 mg | ORAL_TABLET | Freq: Two times a day (BID) | ORAL | Status: DC
  Administered 2015-05-12 – 2015-05-16 (×9): 5 mg via ORAL
  Filled 2015-05-12 (×10): qty 1

## 2015-05-12 MED ORDER — MIRTAZAPINE 15 MG PO TABS
15.0000 mg | ORAL_TABLET | Freq: Every day | ORAL | Status: DC
  Administered 2015-05-12 – 2015-05-15 (×4): 15 mg via ORAL
  Filled 2015-05-12 (×5): qty 1

## 2015-05-12 MED ORDER — SODIUM CHLORIDE 0.9 % IV BOLUS (SEPSIS)
1000.0000 mL | Freq: Once | INTRAVENOUS | Status: AC
  Administered 2015-05-12: 1000 mL via INTRAVENOUS

## 2015-05-12 MED ORDER — LORAZEPAM 1 MG PO TABS
0.0000 mg | ORAL_TABLET | Freq: Four times a day (QID) | ORAL | Status: DC
Start: 2015-05-12 — End: 2015-05-13
  Administered 2015-05-12: 4 mg via ORAL
  Administered 2015-05-13: 1 mg via ORAL
  Administered 2015-05-13: 2 mg via ORAL
  Filled 2015-05-12: qty 2
  Filled 2015-05-12: qty 1
  Filled 2015-05-12: qty 4

## 2015-05-12 NOTE — Progress Notes (Signed)
Per facility:   Patient came to [facility] on March 21 He was referred to Pain MD Dr. Humphrey Rolls per [facility] due to reporting chronic pain.  On March 23rd Md changed medication: Continued same methadone (30 mg in the morning) He stopped fentanyl 75 mg and oxycodone 30mg  q 4x   Patient then started having sweats and confusion He went out with family on march 27th and came back more confused. Facility was concerned about drug use or if he was taking something else thus they ran a UDS which came back negative.  MD Cut methadone to q-20mg   And put him back on oxycodone 30mg  q4 April 3rd.  On April 4th he went out because he had pneumonia and came back to facility on April 8th.  MD was still assessing if AMS was from medication changes vs pneumonia.  Facility reports his Methadone was stopped on April 9th due to patient continuing to report 10/10 and 9/10 regarding pain on methadone. MD felt patient needing longer acting pain medication and remain with oxycodone.    Lane Hacker, MSW  Clinical Social Work: [employer] [phone]
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
## 2015-05-12 NOTE — Consult Note (Signed)
WOC wound consult note Reason for Consult:pressure ulcer on coccyx, Stage 2 Wound type:Pressure Pressure Ulcer POA: Yes Measurement:1cm x 1.4cm x 0.1cm Wound bed: clean, pink, moist Drainage (amount, consistency, odor) scant serous Periwound:erythematous, no induration, no warmth (blanches) Dressing procedure/placement/frequency: Patient will turn and reposition from side to side except for meals and use a pressure redistribution chair cushion while OOB to chair.  A soft silicone foam dressing is to be placed and changed twice weekly and PRN for rolling of dressing edges or soiling. [place] nursing team will not follow, but will remain available to this patient, the nursing and medical teams.  Please re-consult if needed. Thanks, Maudie Flakes, MSN, RN, [place], Arther Abbott  Pager# [phone]

## 2015-05-12 NOTE — Progress Notes (Signed)
[facility] PROGRESS NOTE  [name] [id number] DOB: Mar 02, 1950 DOA: 05/11/2015 PCP: [name], MD  Assessment/Plan: 1-Altered mental status/acute encephalopathy -most likely delirium/polypharmacy -risperdal and zoloft has been discontinued after discussing with psych service -patient narcotic dose has been adjusted -patient started on abilify and remeron dose changed to 15mg  QHS -his mentation is significantly improved and currently oriented X 3 -TSH is elevated at 15; normal B12 and pending RPR -started on synthroid  -will continue supportive care and follow response -PT found him in need for SNF -no signs of infection and ammonia level WNL -neg CT scan  2-Aortic dissection ([place]): -continue outpatient follow up -VSS -type 3  3-HTN (hypertension): -stable and well controlled -will continue current medication therapy  4-Chronic back pain: -on chronic narcotics and difficulty controlling pain -per family, has developed narcotic addiction and was started on methadone recently; unfortunately pain continue to be a problem and due to concerns on AMS, medication discontinued. -will need outpatient follow up and further adjustment on chronic regimen   5-Depression: chronic major depression; unknown recurrence type -psych consulted -recommended continue Remeron, but increased to 15mg  daily -also started on abilify 5 mg BID  6-COPD mixed type ([place]): -no wheezing -stable breathing and good O2 sat on RA -will continue PRN nebulizer  7-Tobacco abuse -cessation counseling provided -continue nicotine patch  8-AKI (acute kidney injury) ([place]): due to poor intake and dehydration most likely -patient reported anorexia and poor intake in the last week or so -Labs for SNF demonstrated creatinine up to 1.52 at some point -last renal function from April 14, 2015 was 0.87 and normal GFR -will continue avoiding/minimizing nephrotoxic agents -after IVF's Cr down to 0.8  again -UA w/o signs of infection  9-Decubitus ulcer of back, stage 2-3: -will follow wound care service rec's -continue preventive measures -present on admission   10-GERD without esophagitis: -will continue PPI  11-Severe protein-calorie malnutrition ([place]) -will consult nutritional service  -will start ensure BID  12-hx of CAD: with recent infarct (in March 2017) -no CP or SOB currently -troponin neg -will continue home medication regimen and outpatient follow up with cardiologist   13-hypothyroidism  -will start treatment with synthroid -TSH 15.193  Code Status: Full Family Communication: wife and daughter at bedside  Disposition Plan: to be determine; most likely SNF in 1-2 days.   Consultants:  Psychiatry   Procedures:  See below for x-ray reports   Antibiotics:  None   HPI/Subjective: Afebrile, feeling better, but continue experiencing severe back pain. Patient is now AAOX3   Objective: Filed Vitals:   05/12/15 0524 05/12/15 0900  BP: 136/82 123/73  Pulse: 92 110  Temp: 98.1 F (36.7 C) 99.4 F (37.4 C)  Resp: 20 21    Intake/Output Summary (Last 24 hours) at 05/12/15 1243 Last data filed at 05/12/15 0600  Gross per 24 hour  Intake   1165 ml  Output      0 ml  Net   1165 ml   Filed Weights   05/11/15 1339 05/11/15 2009  Weight: 51.256 kg (113 lb) 50.5 kg (111 lb 5.3 oz)    Exam:   General:  AAOX3 currently; denies any focal neurologic deficit. Patient with chronic pain in her back and feeling weak/deconditioned. No fever.  Cardiovascular: S1 and S2, no rubs or gallops  Respiratory: good air movement, no wheezing   Abdomen: soft, NT, ND, positive BS  Musculoskeletal: no edema or cyanosis   Skin: patient with stage 2-3 decubitus ulcer; no drainage  appreciated   Data Reviewed: Basic Metabolic Panel:  Recent Labs Lab 05/11/15 1353 05/11/15 2026 05/12/15 0520  NA 133*  --  139  K 4.5  --  4.4  CL 103  --  109  CO2 20*  --   21*  GLUCOSE 122*  --  102*  BUN 19  --  15  CREATININE 1.25*  --  0.84  CALCIUM 9.1  --  9.2  MG  --  1.8  --   PHOS  --  2.6  --    Liver Function Tests:  Recent Labs Lab 05/11/15 1353  AST 27  ALT 14*  ALKPHOS 67  BILITOT 0.6  PROT 6.9  ALBUMIN 3.3*    Recent Labs Lab 05/11/15 1356  AMMONIA 19   CBC:  Recent Labs Lab 05/11/15 1353  WBC 8.2  NEUTROABS 5.6  HGB 9.5*  HCT 28.2*  MCV 97.2  PLT 365   CBG:  Recent Labs Lab 05/11/15 1335  GLUCAP 123*   Studies: Dg Chest 2 View  05/11/2015  CLINICAL DATA:  Altered mental status and confusion. EXAM: CHEST  2 VIEW COMPARISON:  04/25/2015 FINDINGS: Normal cardiac silhouette. Lungs are hyperinflated. Reduction in volume of LEFT upper lobe rounded opacity. Improvement in RIGHT upper lobe airspace disease. Improvement in nodular densities in the LEFT lower lobe additionally. IMPRESSION: 1. Improvement in bilateral pulmonary opacities consistent with improving pulmonary infection. 2. Hyperinflated lungs. Electronically Signed   By: Suzy Bouchard M.D.   On: 05/11/2015 15:12   Ct Head Wo Contrast  05/11/2015  CLINICAL DATA:  Altered mental status for 1 day EXAM: CT HEAD WITHOUT CONTRAST TECHNIQUE: Contiguous axial images were obtained from the base of the skull through the vertex without intravenous contrast. COMPARISON:  04/03/2015 FINDINGS: Skull and Sinuses:Negative for fracture or destructive process. The visualized mastoids, middle ears, and imaged paranasal sinuses are clear. Visualized orbits: Negative. Brain: No evidence of acute infarction, hemorrhage, hydrocephalus, or mass lesion/mass effect. Chronic lacunar infarcts in the anterior limb right internal capsule, left thalamus, and bilateral caudate head. These are stable from prior. IMPRESSION: 1. No acute finding. 2. Remote lacunar infarcts. Electronically Signed   By: Monte Fantasia M.D.   On: 05/11/2015 14:34    Scheduled Meds: . ARIPiprazole  5 mg Oral BID   . aspirin  81 mg Oral Daily  . atorvastatin  20 mg Oral Daily  . feeding supplement (ENSURE ENLIVE)  237 mL Oral BID BM  . folic acid  1 mg Oral Daily  . heparin  5,000 Units Subcutaneous Q8H  . magnesium oxide  800 mg Oral BID  . metoprolol tartrate  25 mg Oral BID  . mirtazapine  15 mg Oral QHS  . nicotine  21 mg Transdermal Daily  . pantoprazole  40 mg Oral BID  . potassium chloride SA  40 mEq Oral Daily  . thiamine  100 mg Oral Daily  . ticagrelor  90 mg Oral BID   Continuous Infusions:   Principal Problem:   Altered mental status Active Problems:   Aortic dissection (H[facility])   HTN (hypertension)   Chronic back pain   Depression   COPD mixed type (H[facility])   Tobacco abuse   AKI (acute kidney injury) ([place])   Decubitus ulcer of back, stage 2   GERD without esophagitis   Severe protein-calorie malnutrition ([place])   Disorientation    Time spent: 77 minutes    Barton Dubois  [facility] Pager [phone]. If 7PM-7AM, please  contact night-coverage at [email or web address], password [facility] 05/12/2015, 12:43 PM
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
## 2015-05-12 NOTE — NC FL2 (Signed)
[place] LEVEL OF CARE SCREENING TOOL     IDENTIFICATION  Patient Name: [name] Birthdate: [birth year] Sex: male Admission Date (Current Location): 05/11/2015  [facility] and [place] Number:  Herbalist and Address:  [facility],  [place] [address], [place]      Provider Number: [id number]  Attending Physician Name and Address:  Barton Dubois, MD  Relative Name and Phone Number:       Current Level of Care: Hospital Recommended Level of Care: [place] Prior Approval Number:    Date Approved/Denied:   PASRR Number: [id number]  Discharge Plan: SNF    Current Diagnoses: Patient Active Problem List   Diagnosis Date Noted  . Altered mental status 05/11/2015  . AKI (acute kidney injury) ([place]) 05/11/2015  . Decubitus ulcer of back, stage 2 05/11/2015  . GERD without esophagitis 05/11/2015  . Severe protein-calorie malnutrition ([place]) 05/11/2015  . Disorientation 05/11/2015  . Malnutrition of moderate degree 04/04/2015  . Acute respiratory failure with hypoxia ([place])   . Aspiration into airway   . Respiratory failure with hypoxia ([place]) 04/03/2015  . Descending thoracic dissection ([place])   . Elevated troponin   . HCAP (healthcare-associated pneumonia)   . Lactic acidosis   . Tylenol toxicity   . Faintness   . Chest pain at rest 03/23/2015  . NSTEMI (non-ST elevated myocardial infarction) ([place])   . Chest pain 03/14/2015  . Elevated troponin 03/14/2015  . Acute myocardial infarction, subendocardial infarction, subsequent episode of care ([place]) 10/26/2014  . Angina pectoris ([place]) 10/26/2014  . COPD mixed type ([place]) 10/26/2014  . Arteriosclerosis of autologous vein coronary artery bypass graft 10/26/2014  . Arteriosclerosis of coronary artery 10/26/2014  . H/O adenomatous polyp of colon 10/26/2014  . Hypercholesterolemia without hypertriglyceridemia 10/26/2014  . LBP (low back pain) 10/26/2014  . Tobacco abuse  10/26/2014  . Depression 04/05/2013  . Cardiac tamponade   . Aortic dissection ([place])   . HTN (hypertension)   . CAD (coronary artery disease)   . Hypercholesterolemia   . Chronic back pain   . Schatzki's ring     Orientation RESPIRATION BLADDER Height & Weight     Self, Place  Normal Continent Weight: 111 lb 5.3 oz (50.5 kg) Height:  5\' 10"  (177.8 cm)  BEHAVIORAL SYMPTOMS/MOOD NEUROLOGICAL BOWEL NUTRITION STATUS      Continent Diet (Heart Healthy)  AMBULATORY STATUS COMMUNICATION OF NEEDS Skin   Extensive Assist Verbally PU Stage and Appropriate Care   PU Stage 2 Dressing: BID (Stage II -  Partial thickness loss of dermis presenting as a shallow open ulcer with a red, pink wound bed without slough)                   Personal Care Assistance Level of Assistance  Bathing, Feeding, Dressing Bathing Assistance: Limited assistance Feeding assistance: Limited assistance Dressing Assistance: Limited assistance     Functional Limitations Info  Sight, Hearing, Speech Sight Info: Adequate Hearing Info: Adequate Speech Info: Adequate    SPECIAL CARE FACTORS FREQUENCY  PT (By licensed PT), OT (By licensed OT)     PT Frequency: 5 OT Frequency: 5            Contractures Contractures Info: Not present    Additional Factors Info  Code Status, Allergies, Psychotropic, Insulin Sliding Scale, Isolation Precautions Code Status Info: Full Code Allergies Info: Codeine Psychotropic Info: Zoloft, Abilify, Remeron Insulin Sliding Scale Info: none Isolation Precautions Info: hx  of MRSA     Current Medications (05/12/2015):  This is the current hospital active medication list Current Facility-Administered Medications  Medication Dose Route Frequency Provider Last Rate Last Dose  . acetaminophen (TYLENOL) tablet 650 mg  650 mg Oral Q8H PRN Barton Dubois, MD      . ARIPiprazole (ABILIFY) tablet 5 mg  5 mg Oral BID Barton Dubois, MD      . aspirin chewable tablet 81 mg  81 mg Oral  Daily Barton Dubois, MD   81 mg at 05/12/15 1052  . atorvastatin (LIPITOR) tablet 20 mg  20 mg Oral Daily Barton Dubois, MD   20 mg at 05/12/15 1052  . feeding supplement (ENSURE ENLIVE) (ENSURE ENLIVE) liquid 237 mL  237 mL Oral BID BM Barton Dubois, MD   237 mL at 05/12/15 1000  . folic acid (FOLVITE) tablet 1 mg  1 mg Oral Daily Barton Dubois, MD   1 mg at 05/12/15 1051  . guaiFENesin-dextromethorphan (ROBITUSSIN DM) 100-10 MG/5ML syrup 10 mL  10 mL Oral Q4H PRN Barton Dubois, MD   10 mL at 05/11/15 2045  . heparin injection 5,000 Units  5,000 Units Subcutaneous Q8H Barton Dubois, MD   5,000 Units at 05/12/15 0527  . ipratropium-albuterol (DUONEB) 0.5-2.5 (3) MG/3ML nebulizer solution 3 mL  3 mL Nebulization Q6H PRN Barton Dubois, MD      . magnesium oxide (MAG-OX) tablet 800 mg  800 mg Oral BID Barton Dubois, MD   800 mg at 05/12/15 1051  . metoprolol tartrate (LOPRESSOR) tablet 25 mg  25 mg Oral BID Barton Dubois, MD   25 mg at 05/12/15 1051  . mirtazapine (REMERON) tablet 15 mg  15 mg Oral QHS Barton Dubois, MD      . nicotine (NICODERM CQ - dosed in mg/24 hours) patch 21 mg  21 mg Transdermal Daily Barton Dubois, MD   21 mg at 05/12/15 1052  . ondansetron (ZOFRAN) injection 4 mg  4 mg Intravenous Q6H PRN Gardiner Barefoot, NP      . oxyCODONE (Oxy IR/ROXICODONE) immediate release tablet 5 mg  5 mg Oral Q8H PRN Barton Dubois, MD   5 mg at 05/12/15 0740  . pantoprazole (PROTONIX) EC tablet 40 mg  40 mg Oral BID Barton Dubois, MD   40 mg at 05/12/15 1051  . potassium chloride SA (K-DUR,KLOR-CON) CR tablet 40 mEq  40 mEq Oral Daily Barton Dubois, MD   40 mEq at 05/12/15 1052  . thiamine (VITAMIN B-1) tablet 100 mg  100 mg Oral Daily Barton Dubois, MD   100 mg at 05/12/15 1051  . ticagrelor (BRILINTA) tablet 90 mg  90 mg Oral BID Barton Dubois, MD   90 mg at 05/12/15 1051   Facility-Administered Medications Ordered in Other Encounters  Medication Dose Route Frequency Provider Last Rate Last  Dose  . vancomycin (VANCOCIN) 1,500 mg in sodium chloride 0.9 % 500 mL IVPB  1,500 mg Intravenous Once Sanjuana Kava, MD         Discharge Medications: Please see discharge summary for a list of discharge medications.  Relevant Imaging Results:  Relevant Lab Results:   Additional Information SS:  [id number]  Lilly Cove, LCSW

## 2015-05-12 NOTE — Progress Notes (Signed)
Patient's teeth are at [facility]. Per their social worker, pt's wife is suppose to pick them up as well as the rest of his belongings.
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
## 2015-05-13 DIAGNOSIS — M549 Dorsalgia, unspecified: Secondary | ICD-10-CM | POA: Diagnosis not present

## 2015-05-13 DIAGNOSIS — R4182 Altered mental status, unspecified: Secondary | ICD-10-CM | POA: Diagnosis not present

## 2015-05-13 DIAGNOSIS — N179 Acute kidney failure, unspecified: Secondary | ICD-10-CM | POA: Diagnosis not present

## 2015-05-13 DIAGNOSIS — I7101 Dissection of thoracic aorta: Secondary | ICD-10-CM | POA: Diagnosis not present

## 2015-05-13 MED ORDER — HALOPERIDOL LACTATE 5 MG/ML IJ SOLN
1.0000 mg | Freq: Two times a day (BID) | INTRAMUSCULAR | Status: DC | PRN
  Administered 2015-05-14 – 2015-05-15 (×2): 1 mg via INTRAVENOUS
  Filled 2015-05-13 (×3): qty 1

## 2015-05-13 NOTE — Progress Notes (Signed)
[facility] PROGRESS NOTE  [name] [id number] DOB: 08/23/1950 DOA: 05/11/2015 PCP: [name], MD  Assessment/Plan: 1-Altered mental status/acute encephalopathy -most likely delirium/polypharmacy -risperdal and zoloft has been discontinued after discussing with psych service -patient narcotic dose has been adjusted -patient started on abilify and remeron dose changed to 15mg  QHS -his mentation is significantly improved and currently oriented X 3 -TSH is elevated at 15; normal B12 and RPR Non reactive -started on synthroid on 4/21 -will continue supportive care and follow response -PT found him in need for SNF -no signs of infection and ammonia level WNL -neg CT scan -last alcohol drink more than 3-4 months ago; doubt any component of alcohol withdrawl; will D/C CIWA   2-Aortic dissection ([place]): -continue outpatient follow up -VSS -type 3  3-HTN (hypertension): -stable and well controlled -will continue current medication therapy  4-Chronic back pain: -on chronic narcotics and difficulty controlling pain -per family, has developed narcotic addiction and was started on methadone recently; unfortunately pain continue to be a problem and due to concerns on AMS, medication discontinued. -will need outpatient follow up and further adjustment on chronic regimen   5-Depression/agitation: chronic major depression; unknown recurrence type -psych consulted -recommended continue Remeron, but increased to 15mg  daily -also started on abilify 5 mg BID -will use low dose haldol PRN; patient last drink months ago; so doubt any component of alcohol withdrawal at this point.   6-COPD mixed type [facility]): -no wheezing -stable breathing and good O2 sat on RA -will continue PRN nebulizer  7-Tobacco abuse -cessation counseling provided -continue nicotine patch  8-AKI (acute kidney injury) ([place]): due to poor intake and dehydration most likely -patient reported anorexia and  poor intake in the last week or so -Labs for SNF demonstrated creatinine up to 1.52 at some point -last renal function from April 14, 2015 was 0.87 and normal GFR -will continue avoiding/minimizing nephrotoxic agents -after IVF's Cr down to 0.8 again -UA w/o signs of infection -will monitor   9-Decubitus ulcer of back, stage 2: -will follow wound care service rec's -continue preventive measures -present on admission   10-GERD without esophagitis: -will continue PPI  11-Severe protein-calorie malnutrition ([place]) -will follow nutritional service rec's -will started on ensure BID  12-hx of CAD: with recent infarct (in March 2017) -no CP or SOB currently -troponin neg -will continue home medication regimen and outpatient follow up with cardiologist   13-hypothyroidism  -will continue treatment with synthroid -TSH 15.193  Code Status: Full Family Communication: wife and daughter at bedside  Disposition Plan: to be determine; most likely SNF in 1-2 days.   Consultants:  Psychiatry   Procedures:  See below for x-ray reports   Antibiotics:  None   HPI/Subjective: Afebrile, complaining of pain on his back; apparently unrelieved by pain meds. Patient is oriented X2 and with poor insight. Very anxious and unable to keep himself still overnight. Gait is off and unstable; making high risk for falls.   Objective: Filed Vitals:   05/12/15 2036 05/13/15 0500  BP: 91/55 138/90  Pulse: 85 82  Temp: 99.1 F (37.3 C) 98.1 F (36.7 C)  Resp: 20 18    Intake/Output Summary (Last 24 hours) at 05/13/15 1205 Last data filed at 05/13/15 0841  Gross per 24 hour  Intake    350 ml  Output      0 ml  Net    350 ml   Filed Weights   05/11/15 1339 05/11/15 2009 05/13/15 0500  Weight: 51.256 kg (  113 lb) 50.5 kg (111 lb 5.3 oz) 49.6 kg (109 lb 5.6 oz)    Exam:   General:  Patient with poor insight and oriented X2 only; able to follow commands. Denies any focal neurologic  deficit. Patient with chronic pain in his back and feeling that is what creates all his problems. No fever.  Cardiovascular: S1 and S2, no rubs or gallops  Respiratory: good air movement, no wheezing   Abdomen: soft, NT, ND, positive BS  Musculoskeletal: no edema or cyanosis   Skin: patient with stage 2 decubitus ulcer; no drainage appreciated   Data Reviewed: Basic Metabolic Panel:  Recent Labs Lab 05/11/15 1353 05/11/15 2026 05/12/15 0520  NA 133*  --  139  K 4.5  --  4.4  CL 103  --  109  CO2 20*  --  21*  GLUCOSE 122*  --  102*  BUN 19  --  15  CREATININE 1.25*  --  0.84  CALCIUM 9.1  --  9.2  MG  --  1.8  --   PHOS  --  2.6  --    Liver Function Tests:  Recent Labs Lab 05/11/15 1353  AST 27  ALT 14*  ALKPHOS 67  BILITOT 0.6  PROT 6.9  ALBUMIN 3.3*    Recent Labs Lab 05/11/15 1356  AMMONIA 19   CBC:  Recent Labs Lab 05/11/15 1353  WBC 8.2  NEUTROABS 5.6  HGB 9.5*  HCT 28.2*  MCV 97.2  PLT 365   CBG:  Recent Labs Lab 05/11/15 1335  GLUCAP 123*   Studies: Dg Chest 2 View  05/11/2015  CLINICAL DATA:  Altered mental status and confusion. EXAM: CHEST  2 VIEW COMPARISON:  04/25/2015 FINDINGS: Normal cardiac silhouette. Lungs are hyperinflated. Reduction in volume of LEFT upper lobe rounded opacity. Improvement in RIGHT upper lobe airspace disease. Improvement in nodular densities in the LEFT lower lobe additionally. IMPRESSION: 1. Improvement in bilateral pulmonary opacities consistent with improving pulmonary infection. 2. Hyperinflated lungs. Electronically Signed   By: Suzy Bouchard M.D.   On: 05/11/2015 15:12   Ct Head Wo Contrast  05/11/2015  CLINICAL DATA:  Altered mental status for 1 day EXAM: CT HEAD WITHOUT CONTRAST TECHNIQUE: Contiguous axial images were obtained from the base of the skull through the vertex without intravenous contrast. COMPARISON:  04/03/2015 FINDINGS: Skull and Sinuses:Negative for fracture or destructive process.  The visualized mastoids, middle ears, and imaged paranasal sinuses are clear. Visualized orbits: Negative. Brain: No evidence of acute infarction, hemorrhage, hydrocephalus, or mass lesion/mass effect. Chronic lacunar infarcts in the anterior limb right internal capsule, left thalamus, and bilateral caudate head. These are stable from prior. IMPRESSION: 1. No acute finding. 2. Remote lacunar infarcts. Electronically Signed   By: Monte Fantasia M.D.   On: 05/11/2015 14:34    Scheduled Meds: . ARIPiprazole  5 mg Oral BID  . aspirin  81 mg Oral Daily  . atorvastatin  20 mg Oral Daily  . diclofenac  1 patch Transdermal BID  . feeding supplement (ENSURE ENLIVE)  237 mL Oral BID BM  . folic acid  1 mg Oral Daily  . levothyroxine  88 mcg Oral QAC breakfast  . magnesium oxide  800 mg Oral BID  . metoprolol tartrate  25 mg Oral BID  . mirtazapine  15 mg Oral QHS  . nicotine  21 mg Transdermal Daily  . pantoprazole  40 mg Oral BID  . potassium chloride SA  40 mEq Oral Daily  .  thiamine  100 mg Oral Daily  . ticagrelor  90 mg Oral BID   Continuous Infusions:   Principal Problem:   Altered mental status Active Problems:   Aortic dissection (HCC)   HTN (hypertension)   Chronic back pain   Depression   COPD mixed type (HCC)   Tobacco abuse   AKI (acute kidney injury) ([place])   Decubitus ulcer of back, stage 2   GERD without esophagitis   Severe protein-calorie malnutrition ([place])   Disorientation    Time spent: 22 minutes    Barton Dubois  [facility] Pager [phone]. If 7PM-7AM, please contact night-coverage at [email or web address], password [facility] 05/13/2015, 12:05 PM

## 2015-05-14 DIAGNOSIS — N179 Acute kidney failure, unspecified: Secondary | ICD-10-CM | POA: Diagnosis not present

## 2015-05-14 DIAGNOSIS — I7101 Dissection of thoracic aorta: Secondary | ICD-10-CM | POA: Diagnosis not present

## 2015-05-14 DIAGNOSIS — R410 Disorientation, unspecified: Secondary | ICD-10-CM | POA: Diagnosis not present

## 2015-05-14 DIAGNOSIS — R4182 Altered mental status, unspecified: Secondary | ICD-10-CM | POA: Diagnosis not present

## 2015-05-14 LAB — CBC
HEMATOCRIT: 31.7 % — AB (ref 39.0–52.0)
HEMOGLOBIN: 10.4 g/dL — AB (ref 13.0–17.0)
MCH: 32.3 pg (ref 26.0–34.0)
MCHC: 32.8 g/dL (ref 30.0–36.0)
MCV: 98.4 fL (ref 78.0–100.0)
Platelets: 294 10*3/uL (ref 150–400)
RBC: 3.22 MIL/uL — ABNORMAL LOW (ref 4.22–5.81)
RDW: 15.7 % — AB (ref 11.5–15.5)
WBC: 8 10*3/uL (ref 4.0–10.5)

## 2015-05-14 LAB — COMPREHENSIVE METABOLIC PANEL
ALT: 13 U/L — ABNORMAL LOW (ref 17–63)
AST: 20 U/L (ref 15–41)
Albumin: 3 g/dL — ABNORMAL LOW (ref 3.5–5.0)
Alkaline Phosphatase: 60 U/L (ref 38–126)
Anion gap: 4 — ABNORMAL LOW (ref 5–15)
BILIRUBIN TOTAL: 0.2 mg/dL — AB (ref 0.3–1.2)
BUN: 19 mg/dL (ref 6–20)
CALCIUM: 8.3 mg/dL — AB (ref 8.9–10.3)
CO2: 23 mmol/L (ref 22–32)
Chloride: 105 mmol/L (ref 101–111)
Creatinine, Ser: 0.91 mg/dL (ref 0.61–1.24)
GFR calc Af Amer: >60 mL/min (ref >60)
Glucose, Bld: 65 mg/dL (ref 65–99)
POTASSIUM: 5 mmol/L (ref 3.5–5.1)
SODIUM: 132 mmol/L — AB (ref 135–145)
TOTAL PROTEIN: 6.5 g/dL (ref 6.5–8.1)

## 2015-05-14 LAB — LACTIC ACID, PLASMA: LACTIC ACID, VENOUS: 1.8 mmol/L (ref 0.5–2.0)

## 2015-05-14 MED ORDER — SODIUM CHLORIDE 0.9 % IV BOLUS (SEPSIS)
1000.0000 mL | Freq: Once | INTRAVENOUS | Status: AC
  Administered 2015-05-14: 1000 mL via INTRAVENOUS

## 2015-05-14 MED ORDER — OXYCODONE HCL 5 MG PO TABS
10.0000 mg | ORAL_TABLET | ORAL | Status: DC | PRN
  Administered 2015-05-14 – 2015-05-15 (×6): 10 mg via ORAL
  Filled 2015-05-14 (×6): qty 2
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
## 2015-05-14 NOTE — Care Management Note (Addendum)
Case Management Note  Patient Details  Name: [name] MRN: [id number] Date of Birth: 1950-07-04  Subjective/Objective:  Altered mental status                  Action/Plan: Discharge Planning:  NCM spoke to pt and gave information about Medicare ABN. States he did not understand form and wanted NCM to contact his wife, [name]. Attempted call to speak to [facility] # [phone] or [phone] . No voice mail. Pt states he did not know if he was going back to [facility] or home. Contacted attending and plan is for dc tomorrow. Pt needed to be without sitter for 24 hours and his mentation was improving.  05/14/2015 1800 NCM spoke to wife, [name] and she plans to take him home if his mentation has improved. He has RW at home. He had [facility] in the past for [facility]. Will have NCM arrange [facility] when orders available on 05/15/2015. Wife states pt has exhausted his Medicare SNF days and would have to pay out of pocket for continued stay at [facility]. Explained Medicare ABN to her and she verbalized an understanding.   Expected Discharge Date: 05/15/2015            Expected Discharge Plan:  Skilled Nursing Facility  In-House Referral:  Clinical Social Work  Discharge planning Services  CM Consult  Post Acute Care Choice:  NA Choice offered to:  NA  DME Arranged:  N/A DME Agency:  NA  HH Arranged:  NA HH Agency:  NA  Status of Service:  Completed, signed off  Medicare Important Message Given:    Date Medicare IM Given:    Medicare IM give by:    Date Additional Medicare IM Given:    Additional Medicare Important Message give by:     If discussed at [place] of Stay Meetings, dates discussed:    Additional Comments:  Erenest Rasher, RN 05/14/2015, 5:04 PM

## 2015-05-14 NOTE — Care Management Obs Status (Signed)
[facility] NOTIFICATION   Patient Details  Name: [name] MRN: [id number] Date of Birth: 10/01/1950   Medicare Observation Status Notification Given:  Yes    Erenest Rasher, RN 05/14/2015, 10:44 AM

## 2015-05-15 DIAGNOSIS — R4182 Altered mental status, unspecified: Secondary | ICD-10-CM | POA: Diagnosis not present

## 2015-05-15 DIAGNOSIS — J449 Chronic obstructive pulmonary disease, unspecified: Secondary | ICD-10-CM | POA: Diagnosis not present

## 2015-05-15 DIAGNOSIS — I7101 Dissection of thoracic aorta: Secondary | ICD-10-CM | POA: Diagnosis not present

## 2015-05-15 DIAGNOSIS — N179 Acute kidney failure, unspecified: Secondary | ICD-10-CM | POA: Insufficient documentation

## 2015-05-15 LAB — BASIC METABOLIC PANEL
Anion gap: 7 (ref 5–15)
BUN: 18 mg/dL (ref 6–20)
CO2: 23 mmol/L (ref 22–32)
CREATININE: 0.83 mg/dL (ref 0.61–1.24)
Calcium: 8.3 mg/dL — ABNORMAL LOW (ref 8.9–10.3)
Chloride: 101 mmol/L (ref 101–111)
GFR calc Af Amer: >60 mL/min (ref >60)
Glucose, Bld: 108 mg/dL — ABNORMAL HIGH (ref 65–99)
POTASSIUM: 4.5 mmol/L (ref 3.5–5.1)
SODIUM: 131 mmol/L — AB (ref 135–145)

## 2015-05-15 LAB — LACTIC ACID, PLASMA: LACTIC ACID, VENOUS: 1.4 mmol/L (ref 0.5–2.0)

## 2015-05-15 MED ORDER — LORAZEPAM 0.5 MG PO TABS
0.5000 mg | ORAL_TABLET | Freq: Once | ORAL | Status: AC
  Administered 2015-05-15: 0.5 mg via ORAL
  Filled 2015-05-15: qty 1

## 2015-05-15 MED ORDER — OXYCODONE HCL 5 MG PO TABS
10.0000 mg | ORAL_TABLET | Freq: Four times a day (QID) | ORAL | Status: DC | PRN
  Administered 2015-05-15 – 2015-05-16 (×4): 10 mg via ORAL
  Filled 2015-05-15 (×4): qty 2

## 2015-05-15 MED ORDER — METOPROLOL TARTRATE 12.5 MG HALF TABLET
12.5000 mg | ORAL_TABLET | Freq: Two times a day (BID) | ORAL | Status: DC
  Administered 2015-05-15 – 2015-05-16 (×2): 12.5 mg via ORAL
  Filled 2015-05-15 (×3): qty 1

## 2015-05-15 MED ORDER — SODIUM CHLORIDE 0.9 % IV BOLUS (SEPSIS)
500.0000 mL | Freq: Once | INTRAVENOUS | Status: AC
  Administered 2015-05-15: 500 mL via INTRAVENOUS

## 2015-05-15 MED ORDER — FLUOXETINE HCL 20 MG PO CAPS
20.0000 mg | ORAL_CAPSULE | Freq: Every day | ORAL | Status: DC
  Administered 2015-05-15 – 2015-05-16 (×2): 20 mg via ORAL
  Filled 2015-05-15 (×2): qty 1

## 2015-05-15 NOTE — Progress Notes (Signed)
[facility] PROGRESS NOTE  [name] [id number] DOB: December 03, 1950 DOA: 05/11/2015 PCP: [name], MD  Assessment/Plan: 1-Altered mental status/acute encephalopathy -most likely delirium/polypharmacy -risperdal and zoloft has been discontinued after discussing with psych service -patient narcotic dose has been adjusted -patient started on abilify and remeron dose changed to 15mg  QHS -also on prozac 15mg  daily  -his mentation is significantly improved and currently oriented X 3 -TSH is elevated at 15; normal B12 and RPR Non reactive -started on synthroid on 4/21; will need thyroid test repeated in 4-6 weeks -will continue supportive care and follow response -PT found him in need for SNF; but not qualifying from insurance standpoint and family unable to pay out of pocket  -no signs of infection and ammonia level WNL -neg CT scan -last alcohol drink more than 3-4 months ago; doubt any component of alcohol withdrawal into his burst of mood changes.    2-Aortic dissection ([place]): -continue outpatient follow up -VSS -type 3  3-HTN (hypertension): with orthostatic changes -stable and well controlled -will continue metoprolol; but will cut dose in half  4-Chronic back pain: -on chronic narcotics and difficulty controlling pain -per family, has developed narcotic addiction and was started on methadone recently; unfortunately pain continue to be a problem and due to concerns on AMS, medication discontinued. -will need outpatient follow up and further adjustment on chronic regimen  -will continue PRN oxycodone 10 mg Q6 Hr   5-Depression/agitation: chronic major depression; unknown recurrence type -psych consulted -recommended continue Remeron, but increased to 15mg  daily -also started on abilify 5 mg BID -will use low dose haldol PRN; patient last drink months ago; so doubt any component of alcohol withdrawal at this point.   6-COPD mixed type [facility]): -no wheezing -stable  breathing and good O2 sat on RA -will continue PRN nebulizer  7-Tobacco abuse -cessation counseling provided -continue nicotine patch  8-AKI (acute kidney injury) ([place]): due to poor intake and dehydration most likely -patient reported anorexia and poor intake in the last week or so -Labs for SNF demonstrated creatinine up to 1.52 at some point -last renal function from April 14, 2015 was 0.87 and normal GFR -will continue avoiding/minimizing nephrotoxic agents -after IVF's Cr down to 0.8 again -UA w/o signs of infection -will monitor   9-Decubitus ulcer of back, stage 2: -will follow wound care service rec's -continue preventive measures -present on admission   10-GERD without esophagitis: -will continue PPI  11-Severe protein-calorie malnutrition ([place]) -will follow nutritional service rec's -will started on ensure BID  12-hx of CAD: with recent infarct (in March 2017) -no CP or SOB currently -troponin neg -will continue home medication regimen and outpatient follow up with cardiologist   13-hypothyroidism  -will continue treatment with synthroid -TSH 15.193  Code Status: Full Family Communication: wife and daughter at bedside  Disposition Plan: to be determine; most likely SNF in 1day (hopefully tomorrow 4/24)   Consultants:  Psychiatry   Procedures:  See below for x-ray reports   Antibiotics:  None   HPI/Subjective: Afebrile, with main complainints of pain on his back. No CP, no SOB. Oriented X3 currently and following commands. With orthostatic VS and dizziness when standing.  Objective: Filed Vitals:   05/15/15 1405 05/15/15 1505  BP: 81/60 99/69  Pulse: 88 78  Temp:  98.6 F (37 C)  Resp:  20    Intake/Output Summary (Last 24 hours) at 05/15/15 1517 Last data filed at 05/15/15 0858  Gross per 24 hour  Intake   1720  ml  Output    450 ml  Net   1270 ml   Filed Weights   05/13/15 0500 05/14/15 0552 05/15/15 0447  Weight: 49.6 kg (109 lb  5.6 oz) 53.2 kg (117 lb 4.6 oz) 52.2 kg (115 lb 1.3 oz)    Exam:   General:  Patient with improved; oriented X 3, interactive and able to follow commands. Denies any focal neurologic deficit. Patient with chronic pain in his back and essentially unrelieved according to him. No fever and no SOB. Positive orthostatic changes and dizziness when standing.   Cardiovascular: S1 and S2, no rubs or gallops  Respiratory: good air movement, no wheezing   Abdomen: soft, NT, ND, positive BS  Musculoskeletal: no edema or cyanosis   Skin: patient with stage 2 decubitus ulcer; no drainage appreciated; steri-foam dressing in place.  Data Reviewed: Basic Metabolic Panel:  Recent Labs Lab 05/11/15 1353 05/11/15 2026 05/12/15 0520 05/14/15 2225 05/15/15 0141  NA 133*  --  139 132* 131*  K 4.5  --  4.4 5.0 4.5  CL 103  --  109 105 101  CO2 20*  --  21* 23 23  GLUCOSE 122*  --  102* 65 108*  BUN 19  --  15 19 18   CREATININE 1.25*  --  0.84 0.91 0.83  CALCIUM 9.1  --  9.2 8.3* 8.3*  MG  --  1.8  --   --   --   PHOS  --  2.6  --   --   --    Liver Function Tests:  Recent Labs Lab 05/11/15 1353 05/14/15 2225  AST 27 20  ALT 14* 13*  ALKPHOS 67 60  BILITOT 0.6 0.2*  PROT 6.9 6.5  ALBUMIN 3.3* 3.0*    Recent Labs Lab 05/11/15 1356  AMMONIA 19   CBC:  Recent Labs Lab 05/11/15 1353 05/14/15 2225  WBC 8.2 8.0  NEUTROABS 5.6  --   HGB 9.5* 10.4*  HCT 28.2* 31.7*  MCV 97.2 98.4  PLT 365 294   CBG:  Recent Labs Lab 05/11/15 1335  GLUCAP 123*   Studies: No results found.  Scheduled Meds: . ARIPiprazole  5 mg Oral BID  . aspirin  81 mg Oral Daily  . atorvastatin  20 mg Oral Daily  . diclofenac  1 patch Transdermal BID  . feeding supplement (ENSURE ENLIVE)  237 mL Oral BID BM  . FLUoxetine  20 mg Oral Daily  . folic acid  1 mg Oral Daily  . levothyroxine  88 mcg Oral QAC breakfast  . magnesium oxide  800 mg Oral BID  . metoprolol tartrate  12.5 mg Oral BID  .  mirtazapine  15 mg Oral QHS  . nicotine  21 mg Transdermal Daily  . pantoprazole  40 mg Oral BID  . potassium chloride SA  40 mEq Oral Daily  . sodium chloride  500 mL Intravenous Once  . thiamine  100 mg Oral Daily  . ticagrelor  90 mg Oral BID   Continuous Infusions:   Principal Problem:   Altered mental status Active Problems:   Aortic dissection (H[facility])   HTN (hypertension)   Chronic back pain   Depression   COPD mixed type (H[facility])   Tobacco abuse   AKI (acute kidney injury) ([place])   Decubitus ulcer of back, stage 2   GERD without esophagitis   Severe protein-calorie malnutrition ([facility])   Disorientation    Time spent: 30 minutes  Barton Dubois  [facility] Pager [phone]. If 7PM-7AM, please contact night-coverage at [email or web address], password [facility] 05/15/2015, 3:17 PM

## 2015-05-15 NOTE — Progress Notes (Addendum)
LCSW continues to follow for disposition: Return to SNF.   LCSW has called both daughter and wife. Daughter: Nelva Nay [phone].   (message left) Terri Piedra (wife)  Numbers are unable to work or leave message.   Disposition :  Home with Home health per wife Terri Piedra) Wife called LCSW back at 11:15am.   Wife reports family is unable to pay out of pocket at this time for continued SNF care. Wife is open to home health and has used [facility] in the past when patient has MRSA in his arm. Plan will be for family to take patient home at DC. RN CM: Rhonda made aware of plan and will follow up with family.  No other CSW needs at this time. If needs arise, please reconsult. Otherwise: signing off.   Lane Hacker, MSW Clinical Social Work: [employer] [phone]

## 2015-05-15 NOTE — Progress Notes (Signed)
[id number] [place],BSN,RN,CCM,RN3:  Home health providers discussed with patient.  List of home health providers given.  Patient has chosen [facility] with [facility] notified.
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
## 2015-05-15 NOTE — Progress Notes (Signed)
Pt was incoherent, shakey, getting panicky.  BP checked, 88/49, rechecked, 71/48.  PA called, 1 liter bolus given, bp up to 112/72 Pt coherent and calm. Lactic acid also ordered which was 1.8. PA aware. Fara Olden P

## 2015-05-16 DIAGNOSIS — I951 Orthostatic hypotension: Secondary | ICD-10-CM | POA: Insufficient documentation

## 2015-05-16 DIAGNOSIS — I7101 Dissection of thoracic aorta: Secondary | ICD-10-CM | POA: Diagnosis not present

## 2015-05-16 DIAGNOSIS — N179 Acute kidney failure, unspecified: Secondary | ICD-10-CM | POA: Diagnosis not present

## 2015-05-16 DIAGNOSIS — M549 Dorsalgia, unspecified: Secondary | ICD-10-CM | POA: Diagnosis not present

## 2015-05-16 DIAGNOSIS — R4182 Altered mental status, unspecified: Secondary | ICD-10-CM | POA: Diagnosis not present

## 2015-05-16 MED ORDER — OXYCODONE HCL 10 MG PO TABS: 10.0000 mg | ORAL_TABLET | Freq: Four times a day (QID) | ORAL | Status: DC | PRN

## 2015-05-16 MED ORDER — LEVOTHYROXINE SODIUM 88 MCG PO TABS: 88.0000 ug | ORAL_TABLET | Freq: Every day | ORAL | Status: DC

## 2015-05-16 MED ORDER — ENSURE ENLIVE PO LIQD: 237.0000 mL | Freq: Three times a day (TID) | ORAL | Status: DC

## 2015-05-16 MED ORDER — FLUDROCORTISONE ACETATE 0.1 MG PO TABS
0.1000 mg | ORAL_TABLET | Freq: Two times a day (BID) | ORAL | Status: DC
  Administered 2015-05-16: 0.1 mg via ORAL
  Filled 2015-05-16 (×2): qty 1

## 2015-05-16 MED ORDER — METOPROLOL TARTRATE 25 MG PO TABS: 12.5000 mg | ORAL_TABLET | Freq: Two times a day (BID) | ORAL | Status: DC

## 2015-05-16 MED ORDER — ARIPIPRAZOLE 5 MG PO TABS: 5.0000 mg | ORAL_TABLET | Freq: Two times a day (BID) | ORAL | Status: DC

## 2015-05-16 MED ORDER — MIRTAZAPINE 15 MG PO TABS
15.0000 mg | ORAL_TABLET | Freq: Every day | ORAL | Status: DC
Start: 2015-05-16 — End: 2017-12-31

## 2015-05-16 MED ORDER — SODIUM CHLORIDE 0.9 % IV BOLUS (SEPSIS)
1000.0000 mL | Freq: Once | INTRAVENOUS | Status: AC
  Administered 2015-05-16: 1000 mL via INTRAVENOUS

## 2015-05-16 MED ORDER — FLUDROCORTISONE ACETATE 0.1 MG PO TABS: 0.2000 mg | ORAL_TABLET | Freq: Every day | ORAL | Status: DC

## 2015-05-16 MED ORDER — LORAZEPAM 2 MG/ML IJ SOLN
0.5000 mg | Freq: Once | INTRAMUSCULAR | Status: AC
  Administered 2015-05-16: 0.5 mg via INTRAVENOUS
  Filled 2015-05-16: qty 1

## 2015-05-16 MED ORDER — FLUOXETINE HCL 20 MG PO CAPS: 20.0000 mg | ORAL_CAPSULE | Freq: Every day | ORAL | Status: DC
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
## 2015-05-16 NOTE — Progress Notes (Signed)
Pt discharged from the unit via wheelchair. Discharge instructions were reviewed with the pt and family members. Pt informed to see PCP about chronic anxiety issues and for anxiety medications. No questions or concerns from the pt at this time.  Romello Hoehn W Jeston Junkins, RN

## 2015-05-20 LAB — CULTURE, BLOOD (ROUTINE X 2)
CULTURE: NO GROWTH
Culture: NO GROWTH

## 2015-07-20 ENCOUNTER — Ambulatory Visit (INDEPENDENT_AMBULATORY_CARE_PROVIDER_SITE_OTHER): Payer: Medicare PPO | Admitting: Cardiology

## 2015-07-20 ENCOUNTER — Encounter: Payer: Self-pay | Admitting: Cardiology

## 2015-07-20 VITALS — BP 140/88 | HR 92 | Ht 70.0 in | Wt 147.0 lb

## 2015-07-20 DIAGNOSIS — E7800 Pure hypercholesterolemia, unspecified: Secondary | ICD-10-CM

## 2015-07-20 DIAGNOSIS — I10 Essential (primary) hypertension: Secondary | ICD-10-CM | POA: Diagnosis not present

## 2015-07-20 DIAGNOSIS — I7103 Dissection of thoracoabdominal aorta: Secondary | ICD-10-CM

## 2015-07-20 DIAGNOSIS — I2583 Coronary atherosclerosis due to lipid rich plaque: Principal | ICD-10-CM

## 2015-07-20 DIAGNOSIS — I2510 Atherosclerotic heart disease of native coronary artery without angina pectoris: Secondary | ICD-10-CM | POA: Diagnosis not present

## 2015-07-20 NOTE — Patient Instructions (Signed)
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
## 2015-08-03 ENCOUNTER — Emergency Department (HOSPITAL_COMMUNITY): Payer: Medicare PPO

## 2015-08-03 ENCOUNTER — Encounter (HOSPITAL_COMMUNITY): Payer: Self-pay | Admitting: Cardiology

## 2015-08-03 ENCOUNTER — Inpatient Hospital Stay (HOSPITAL_COMMUNITY)
Admission: EM | Admit: 2015-08-03 | Discharge: 2015-08-06 | DRG: 190 | Disposition: A | Discharge status: Home / Self Care | Payer: Medicare PPO | Attending: Internal Medicine | Admitting: Internal Medicine

## 2015-08-03 DIAGNOSIS — Z951 Presence of aortocoronary bypass graft: Secondary | ICD-10-CM

## 2015-08-03 DIAGNOSIS — E7800 Pure hypercholesterolemia, unspecified: Secondary | ICD-10-CM | POA: Diagnosis present

## 2015-08-03 DIAGNOSIS — J9620 Acute and chronic respiratory failure, unspecified whether with hypoxia or hypercapnia: Secondary | ICD-10-CM | POA: Diagnosis present

## 2015-08-03 DIAGNOSIS — Z87891 Personal history of nicotine dependence: Secondary | ICD-10-CM | POA: Diagnosis not present

## 2015-08-03 DIAGNOSIS — I10 Essential (primary) hypertension: Secondary | ICD-10-CM | POA: Diagnosis present

## 2015-08-03 DIAGNOSIS — Z8249 Family history of ischemic heart disease and other diseases of the circulatory system: Secondary | ICD-10-CM | POA: Diagnosis not present

## 2015-08-03 DIAGNOSIS — K219 Gastro-esophageal reflux disease without esophagitis: Secondary | ICD-10-CM | POA: Diagnosis present

## 2015-08-03 DIAGNOSIS — J9601 Acute respiratory failure with hypoxia: Secondary | ICD-10-CM | POA: Diagnosis present

## 2015-08-03 DIAGNOSIS — E785 Hyperlipidemia, unspecified: Secondary | ICD-10-CM | POA: Diagnosis present

## 2015-08-03 DIAGNOSIS — R748 Abnormal levels of other serum enzymes: Secondary | ICD-10-CM | POA: Diagnosis present

## 2015-08-03 DIAGNOSIS — I2510 Atherosclerotic heart disease of native coronary artery without angina pectoris: Secondary | ICD-10-CM | POA: Diagnosis present

## 2015-08-03 DIAGNOSIS — I7100 Dissection of unspecified site of aorta: Secondary | ICD-10-CM | POA: Diagnosis present

## 2015-08-03 DIAGNOSIS — J441 Chronic obstructive pulmonary disease with (acute) exacerbation: Secondary | ICD-10-CM | POA: Diagnosis present

## 2015-08-03 DIAGNOSIS — I7101 Dissection of thoracic aorta: Secondary | ICD-10-CM

## 2015-08-03 DIAGNOSIS — R0902 Hypoxemia: Secondary | ICD-10-CM

## 2015-08-03 DIAGNOSIS — Z8711 Personal history of peptic ulcer disease: Secondary | ICD-10-CM

## 2015-08-03 DIAGNOSIS — R0602 Shortness of breath: Secondary | ICD-10-CM | POA: Diagnosis not present

## 2015-08-03 LAB — BLOOD GAS, ARTERIAL
ACID-BASE EXCESS: 0.2 mmol/L (ref 0.0–2.0)
BICARBONATE: 24.1 meq/L — AB (ref 20.0–24.0)
DRAWN BY: 277331
O2 CONTENT: 4 L/min
O2 SAT: 97.5 %
PATIENT TEMPERATURE: 37
PO2 ART: 105 mmHg — AB (ref 80.0–100.0)
TCO2: 48.2 mmol/L (ref 0–100)
pCO2 arterial: 48.2 mmHg — ABNORMAL HIGH (ref 35.0–45.0)
pH, Arterial: 7.34 — ABNORMAL LOW (ref 7.350–7.450)

## 2015-08-03 LAB — I-STAT CG4 LACTIC ACID, ED: LACTIC ACID, VENOUS: 1.91 mmol/L — AB (ref 0.5–1.9)

## 2015-08-03 LAB — TROPONIN I
TROPONIN I: 0.05 ng/mL — AB (ref <0.03)
Troponin I: 0.05 ng/mL (ref <0.03)
Troponin I: 0.05 ng/mL (ref <0.03)

## 2015-08-03 LAB — CBC WITH DIFFERENTIAL/PLATELET
BASOS ABS: 0.1 10*3/uL (ref 0.0–0.1)
Basophils Relative: 1 %
EOS ABS: 1 10*3/uL — AB (ref 0.0–0.7)
EOS PCT: 12 %
HCT: 38.6 % — ABNORMAL LOW (ref 39.0–52.0)
HEMOGLOBIN: 12.8 g/dL — AB (ref 13.0–17.0)
Lymphocytes Relative: 31 %
Lymphs Abs: 2.7 10*3/uL (ref 0.7–4.0)
MCH: 32.3 pg (ref 26.0–34.0)
MCHC: 33.2 g/dL (ref 30.0–36.0)
MCV: 97.5 fL (ref 78.0–100.0)
Monocytes Absolute: 0.9 10*3/uL (ref 0.1–1.0)
Monocytes Relative: 11 %
NEUTROS PCT: 45 %
Neutro Abs: 3.9 10*3/uL (ref 1.7–7.7)
PLATELETS: 215 10*3/uL (ref 150–400)
RBC: 3.96 MIL/uL — AB (ref 4.22–5.81)
RDW: 13.2 % (ref 11.5–15.5)
WBC: 8.5 10*3/uL (ref 4.0–10.5)

## 2015-08-03 LAB — COMPREHENSIVE METABOLIC PANEL
ALT: 11 U/L — AB (ref 17–63)
AST: 20 U/L (ref 15–41)
Albumin: 4 g/dL (ref 3.5–5.0)
Alkaline Phosphatase: 53 U/L (ref 38–126)
Anion gap: 9 (ref 5–15)
BILIRUBIN TOTAL: 0.3 mg/dL (ref 0.3–1.2)
BUN: 11 mg/dL (ref 6–20)
CHLORIDE: 104 mmol/L (ref 101–111)
CO2: 25 mmol/L (ref 22–32)
CREATININE: 0.73 mg/dL (ref 0.61–1.24)
Calcium: 9 mg/dL (ref 8.9–10.3)
Glucose, Bld: 135 mg/dL — ABNORMAL HIGH (ref 65–99)
POTASSIUM: 3.7 mmol/L (ref 3.5–5.1)
Sodium: 138 mmol/L (ref 135–145)
TOTAL PROTEIN: 7.5 g/dL (ref 6.5–8.1)

## 2015-08-03 LAB — MRSA PCR SCREENING: MRSA by PCR: POSITIVE — AB

## 2015-08-03 LAB — TSH: TSH: 6.826 u[IU]/mL — ABNORMAL HIGH (ref 0.350–4.500)

## 2015-08-03 MED ORDER — MORPHINE SULFATE 15 MG PO TABS
15.0000 mg | ORAL_TABLET | Freq: Once | ORAL | Status: AC
  Administered 2015-08-03: 15 mg via ORAL
  Filled 2015-08-03: qty 1

## 2015-08-03 MED ORDER — ARIPIPRAZOLE 10 MG PO TABS
5.0000 mg | ORAL_TABLET | Freq: Two times a day (BID) | ORAL | Status: DC
Start: 2015-08-03 — End: 2015-08-06
  Administered 2015-08-03 – 2015-08-06 (×5): 5 mg via ORAL
  Filled 2015-08-03 (×6): qty 1

## 2015-08-03 MED ORDER — ASPIRIN 81 MG PO CHEW
81.0000 mg | CHEWABLE_TABLET | Freq: Every day | ORAL | Status: DC
  Administered 2015-08-03 – 2015-08-06 (×4): 81 mg via ORAL
  Filled 2015-08-03 (×4): qty 1

## 2015-08-03 MED ORDER — NICOTINE 14 MG/24HR TD PT24
14.0000 mg | MEDICATED_PATCH | Freq: Every day | TRANSDERMAL | Status: DC
  Administered 2015-08-03 – 2015-08-05 (×3): 14 mg via TRANSDERMAL
  Filled 2015-08-03 (×3): qty 1

## 2015-08-03 MED ORDER — ENOXAPARIN SODIUM 40 MG/0.4ML ~~LOC~~ SOLN
40.0000 mg | SUBCUTANEOUS | Status: DC
  Administered 2015-08-03 – 2015-08-05 (×3): 40 mg via SUBCUTANEOUS
  Filled 2015-08-03 (×3): qty 0.4

## 2015-08-03 MED ORDER — PANTOPRAZOLE SODIUM 40 MG PO TBEC
40.0000 mg | DELAYED_RELEASE_TABLET | Freq: Every day | ORAL | Status: DC
Start: 2015-08-03 — End: 2015-08-06
  Administered 2015-08-03 – 2015-08-05 (×3): 40 mg via ORAL
  Filled 2015-08-03 (×3): qty 1

## 2015-08-03 MED ORDER — SODIUM CHLORIDE 0.9% FLUSH: 3.0000 mL | INTRAVENOUS | Status: DC | PRN

## 2015-08-03 MED ORDER — MUPIROCIN 2 % EX OINT
1.0000 "application " | TOPICAL_OINTMENT | Freq: Two times a day (BID) | CUTANEOUS | Status: DC
  Administered 2015-08-04 – 2015-08-05 (×3): 1 via NASAL
  Filled 2015-08-03: qty 22

## 2015-08-03 MED ORDER — TIOTROPIUM BROMIDE MONOHYDRATE 18 MCG IN CAPS
18.0000 ug | ORAL_CAPSULE | Freq: Every day | RESPIRATORY_TRACT | Status: DC
  Administered 2015-08-04 – 2015-08-06 (×3): 18 ug via RESPIRATORY_TRACT
  Filled 2015-08-03: qty 5

## 2015-08-03 MED ORDER — ATORVASTATIN CALCIUM 20 MG PO TABS
20.0000 mg | ORAL_TABLET | Freq: Every day | ORAL | Status: DC
  Administered 2015-08-03 – 2015-08-06 (×4): 20 mg via ORAL
  Filled 2015-08-03 (×4): qty 1

## 2015-08-03 MED ORDER — MOMETASONE FURO-FORMOTEROL FUM 200-5 MCG/ACT IN AERO
2.0000 | INHALATION_SPRAY | Freq: Two times a day (BID) | RESPIRATORY_TRACT | Status: DC
  Administered 2015-08-03 – 2015-08-06 (×6): 2 via RESPIRATORY_TRACT
  Filled 2015-08-03: qty 8.8

## 2015-08-03 MED ORDER — IPRATROPIUM-ALBUTEROL 0.5-2.5 (3) MG/3ML IN SOLN
3.0000 mL | Freq: Four times a day (QID) | RESPIRATORY_TRACT | Status: DC
  Administered 2015-08-03 – 2015-08-06 (×12): 3 mL via RESPIRATORY_TRACT
  Filled 2015-08-03 (×12): qty 3

## 2015-08-03 MED ORDER — CHLORHEXIDINE GLUCONATE CLOTH 2 % EX PADS
6.0000 | MEDICATED_PAD | Freq: Every day | CUTANEOUS | Status: DC
  Administered 2015-08-04 – 2015-08-06 (×2): 6 via TOPICAL

## 2015-08-03 MED ORDER — MIRTAZAPINE 15 MG PO TABS
15.0000 mg | ORAL_TABLET | Freq: Every day | ORAL | Status: DC
  Administered 2015-08-03 – 2015-08-05 (×3): 15 mg via ORAL
  Filled 2015-08-03 (×3): qty 1

## 2015-08-03 MED ORDER — TICAGRELOR 90 MG PO TABS
90.0000 mg | ORAL_TABLET | Freq: Two times a day (BID) | ORAL | Status: DC
  Administered 2015-08-04 – 2015-08-06 (×4): 90 mg via ORAL
  Filled 2015-08-03 (×8): qty 1

## 2015-08-03 MED ORDER — ACETAMINOPHEN 325 MG PO TABS
650.0000 mg | ORAL_TABLET | Freq: Four times a day (QID) | ORAL | Status: DC | PRN
  Administered 2015-08-04 – 2015-08-06 (×2): 650 mg via ORAL
  Filled 2015-08-03 (×2): qty 2

## 2015-08-03 MED ORDER — ALBUTEROL (5 MG/ML) CONTINUOUS INHALATION SOLN
10.0000 mg/h | INHALATION_SOLUTION | Freq: Once | RESPIRATORY_TRACT | Status: AC
  Administered 2015-08-03: 10 mg/h via RESPIRATORY_TRACT
  Filled 2015-08-03: qty 20

## 2015-08-03 MED ORDER — METHYLPREDNISOLONE SODIUM SUCC 125 MG IJ SOLR
80.0000 mg | Freq: Four times a day (QID) | INTRAMUSCULAR | Status: DC
  Administered 2015-08-03 – 2015-08-06 (×12): 80 mg via INTRAVENOUS
  Filled 2015-08-03 (×12): qty 2

## 2015-08-03 MED ORDER — METHYLPREDNISOLONE SODIUM SUCC 125 MG IJ SOLR
125.0000 mg | Freq: Once | INTRAMUSCULAR | Status: AC
  Administered 2015-08-03: 125 mg via INTRAVENOUS
  Filled 2015-08-03: qty 2

## 2015-08-03 MED ORDER — ALBUTEROL SULFATE (2.5 MG/3ML) 0.083% IN NEBU
2.5000 mg | INHALATION_SOLUTION | RESPIRATORY_TRACT | Status: DC | PRN
Start: 2015-08-03 — End: 2015-08-06
  Administered 2015-08-03 – 2015-08-06 (×3): 2.5 mg via RESPIRATORY_TRACT
  Filled 2015-08-03 (×3): qty 3

## 2015-08-03 MED ORDER — ONDANSETRON HCL 4 MG/2ML IJ SOLN
4.0000 mg | Freq: Four times a day (QID) | INTRAMUSCULAR | Status: DC | PRN
Start: 2015-08-03 — End: 2015-08-06

## 2015-08-03 MED ORDER — ENSURE ENLIVE PO LIQD: 237.0000 mL | Freq: Three times a day (TID) | ORAL | Status: DC

## 2015-08-03 MED ORDER — SENNOSIDES-DOCUSATE SODIUM 8.6-50 MG PO TABS: 1.0000 | ORAL_TABLET | Freq: Every evening | ORAL | Status: DC | PRN

## 2015-08-03 MED ORDER — ACETAMINOPHEN 650 MG RE SUPP: 650.0000 mg | Freq: Four times a day (QID) | RECTAL | Status: DC | PRN

## 2015-08-03 MED ORDER — METOPROLOL TARTRATE 25 MG PO TABS
12.5000 mg | ORAL_TABLET | Freq: Two times a day (BID) | ORAL | Status: DC
Start: 2015-08-03 — End: 2015-08-06
  Administered 2015-08-03 – 2015-08-06 (×6): 12.5 mg via ORAL
  Filled 2015-08-03 (×6): qty 1

## 2015-08-03 MED ORDER — ENSURE ENLIVE PO LIQD
237.0000 mL | Freq: Two times a day (BID) | ORAL | Status: DC
  Administered 2015-08-04 (×2): 237 mL via ORAL

## 2015-08-03 MED ORDER — SODIUM CHLORIDE 0.9% FLUSH
3.0000 mL | Freq: Two times a day (BID) | INTRAVENOUS | Status: DC
  Administered 2015-08-04 – 2015-08-06 (×4): 3 mL via INTRAVENOUS

## 2015-08-03 MED ORDER — BENZONATATE 100 MG PO CAPS
100.0000 mg | ORAL_CAPSULE | Freq: Two times a day (BID) | ORAL | Status: DC | PRN
  Administered 2015-08-03 – 2015-08-04 (×3): 100 mg via ORAL
  Filled 2015-08-03 (×3): qty 1

## 2015-08-03 MED ORDER — MORPHINE SULFATE 15 MG PO TABS
15.0000 mg | ORAL_TABLET | Freq: Two times a day (BID) | ORAL | Status: DC
  Administered 2015-08-03 – 2015-08-04 (×2): 15 mg via ORAL
  Filled 2015-08-03 (×2): qty 1

## 2015-08-03 MED ORDER — ONDANSETRON HCL 4 MG PO TABS: 4.0000 mg | ORAL_TABLET | Freq: Four times a day (QID) | ORAL | Status: DC | PRN

## 2015-08-03 MED ORDER — SODIUM CHLORIDE 0.9 % IV SOLN: 250.0000 mL | INTRAVENOUS | Status: DC | PRN

## 2015-08-03 NOTE — ED Notes (Signed)
Pt states he is feeling a little better, appears to be resting more comfortably.

## 2015-08-03 NOTE — Progress Notes (Signed)
RN, [name], paged requesting nicoderm patch for [name]. Pt stated he has been wearing a 44mcg for past 2 months. Says it makes his BP high sometimes. When asked about smoking hx, [name] stated that he has smoked "on and off" for 20 yrs, 1 PPD. He quit "2 months ago". Advised [name] that he should be able to go down to 20mcg now and after 6-8 more weeks, go down to 52mcg, and after that, should be able to quit. He is OK with this plan. 47mcg ordered. Hx in chart changed to reflect smoking hx.  KJKG, NP [facility]

## 2015-08-03 NOTE — ED Provider Notes (Signed)
CSN: [id number]     Arrival date & time 08/03/15  0813 History  By signing my name below, I, [name], attest that this documentation has been prepared under the direction and in the presence of [name], MD . Electronically Signed: Higinio Plan, Scribe. 08/03/2015. 10:09 AM.   Chief Complaint  Patient presents with  . Shortness of Breath   The history is provided by the patient. No language interpreter was used.   HPI Comments: [name] is a 65 y.o. male with PMHx of HTN, CAD, and COPD, who presents to the Emergency Department complaining of gradually worsening, shortness of breath that began 4 days ago and worsened this morning. Pt states he "nearly passes out" walking from his bed to his bathroom at home due to difficulty breathing. He notes he has had breathing treatments with relief in the past; though, he notes he has not used any treatments at home recently. Pt reports he still currently smokes. He notes hx of pneumonia recently that was treated in the ED on 3/13.   Past Medical History  Diagnosis Date  . Cardiac tamponade   . Aortic dissection (HCC)     TYPE 3  . HTN (hypertension)   . CAD (coronary artery disease)   . Hypercholesterolemia   . Chronic back pain   . Hiatal hernia   . Schatzki's ring   . Chronic fatigue   . Chronic pain   . Peptic ulcer disease 08/2010    EGD   . COPD (chronic obstructive pulmonary disease) ([place])   . Chronic bronchitis ([place])   . Dysphagia, oropharyngeal phase   . Anemia   . Anxiety   . GERD (gastroesophageal reflux disease)   . Constipation   . PNA (pneumonia)   . ETOH abuse   . Opioid abuse   . Depression   . Hyperlipidemia   . Metabolic encephalopathy   . Cardiac tamponade   . Peptic ulcer    Past Surgical History  Procedure Laterality Date  . Sternal wire removeal  12/31/2007    HENDRICKSON  . Cabg x 6  07/31/2007    HENDRICKSON  . Cardiac catheterization    . Knee surgery Right     acl  . Shoulder surgery Left   .  Orif scapular fracture Right   . Back surgery    . Olecranon bursectomy Left 08/27/2012    Procedure: EXCISION LEFT OLECRANON BURSA;  Surgeon: Sanjuana Kava, MD;  Location: [facility];  Service: Orthopedics;  Laterality: Left;  . Olecranon bursectomy Left 05/25/2013    Procedure:  Excision sinus tract and tissue left elbow ;  Surgeon: Sanjuana Kava, MD;  Location: [facility];  Service: Orthopedics;  Laterality: Left;  . Cardiac catheterization N/A 03/16/2015    Procedure: Coronary/Graft Angiography;  Surgeon: Sherren Mocha, MD;  Location: [facility];  Service: Cardiovascular;  Laterality: N/A;  . Cardiac catheterization N/A 03/16/2015    Procedure: Coronary Stent Intervention;  Surgeon: Sherren Mocha, MD;  Location: [place];  Service: Cardiovascular;  Laterality: N/A;   Family History  Problem Relation Age of Onset  . Heart disease Mother   . Heart disease Father    Social History  Substance Use Topics  . Smoking status: Never Smoker   . Smokeless tobacco: Never Used  . Alcohol Use: No    Review of Systems  Constitutional: Positive for diaphoresis.  Respiratory: Positive for shortness of breath.   Neurological: Positive for syncope (near syncope).  All other systems reviewed and are negative.  Allergies  Bee venom and Codeine  Home Medications   Prior to Admission medications   Medication Sig Start Date End Date Taking? Authorizing Provider  acetaminophen (TYLENOL 8 HOUR ARTHRITIS PAIN) 650 MG CR tablet Take 650 mg by mouth every 4 (four) hours as needed for pain.    Historical Provider, MD  ARIPiprazole (ABILIFY) 5 MG tablet Take 1 tablet (5 mg total) by mouth 2 (two) times daily. 05/16/15   Vassie Loll, MD  aspirin 81 MG chewable tablet Chew 1 tablet (81 mg total) by mouth daily. 04/10/15   Richarda Overlie, MD  atorvastatin (LIPITOR) 20 MG tablet Take 20 mg by mouth daily.    Historical Provider, MD  feeding supplement, ENSURE ENLIVE, (ENSURE ENLIVE) LIQD Take 237 mLs by  mouth 3 (three) times daily between meals. 05/16/15   Vassie Loll, MD  fludrocortisone (FLORINEF) 0.1 MG tablet Take 2 tablets (0.2 mg total) by mouth daily. 05/16/15   Vassie Loll, MD  levothyroxine (SYNTHROID, LEVOTHROID) 88 MCG tablet Take 1 tablet (88 mcg total) by mouth daily before breakfast. 05/16/15   Vassie Loll, MD  metoprolol tartrate (LOPRESSOR) 25 MG tablet Take 0.5 tablets (12.5 mg total) by mouth 2 (two) times daily. 05/16/15   Vassie Loll, MD  mirtazapine (REMERON) 15 MG tablet Take 1 tablet (15 mg total) by mouth at bedtime. 05/16/15   Vassie Loll, MD  pantoprazole (PROTONIX) 40 MG tablet Take 1 tablet (40 mg total) by mouth at bedtime. 04/10/15   Richarda Overlie, MD  ticagrelor (BRILINTA) 90 MG TABS tablet Take 1 tablet (90 mg total) by mouth 2 (two) times daily. 03/17/15   Dwana Melena, PA-C   BP 163/106 mmHg  Pulse 110  Temp(Src) 98.3 F (36.8 C) (Oral)  Resp 28  Ht  (1.778 m)  Wt 150 lb (68.04 kg)  BMI 21.52 kg/m2  SpO2 86% Physical Exam  Constitutional: He is oriented to person, place, and time. He appears distressed.  HENT:  Head: Normocephalic and atraumatic.  Eyes: Conjunctivae are normal. Pupils are equal, round, and reactive to light. Right eye exhibits no discharge. Left eye exhibits no discharge. No scleral icterus.  Neck: Normal range of motion. No JVD present. No tracheal deviation present.  Cardiovascular: Tachycardia present.   tachypnic  Pulmonary/Chest: No stridor. Tachypnea noted. He has wheezes.  Neurological: He is alert and oriented to person, place, and time. Coordination normal.  Skin: He is diaphoretic.  Psychiatric: He has a normal mood and affect. His behavior is normal. Judgment and thought content normal.  Nursing note and vitals reviewed.   ED Course  Procedures  DIAGNOSTIC STUDIES:  Oxygen Saturation is 97% on RA, normal by my interpretation.    COORDINATION OF CARE:  8:23 AM Discussed treatment plan with pt at bedside and  pt agreed to plan.  Labs Review Labs Reviewed  COMPREHENSIVE METABOLIC PANEL - Abnormal; Notable for the following:    Glucose, Bld 135 (*)    ALT 11 (*)    All other components within normal limits  CBC WITH DIFFERENTIAL/PLATELET - Abnormal; Notable for the following:    RBC 3.96 (*)    Hemoglobin 12.8 (*)    HCT 38.6 (*)    Eosinophils Absolute 1.0 (*)    All other components within normal limits  TROPONIN I - Abnormal; Notable for the following:    Troponin I 0.05 (*)    All other components within normal limits  BLOOD  GAS, ARTERIAL - Abnormal; Notable for the following:    pH, Arterial 7.34 (*)    pCO2 arterial 48.2 (*)    pO2, Arterial 105 (*)    Bicarbonate 24.1 (*)    All other components within normal limits  TSH - Abnormal; Notable for the following:    TSH 6.826 (*)    All other components within normal limits  I-STAT CG4 LACTIC ACID, ED - Abnormal; Notable for the following:    Lactic Acid, Venous 1.91 (*)    All other components within normal limits  CULTURE, BLOOD (ROUTINE X 2)  CULTURE, BLOOD (ROUTINE X 2)  URINALYSIS, ROUTINE W REFLEX MICROSCOPIC (NOT AT [facility])    Imaging Review Dg Chest 2 View  08/03/2015  CLINICAL DATA:  65 year old male with shortness of breath for 4 days. Known descending thoracic aortic dissection. EXAM: CHEST  2 VIEW COMPARISON:  05/11/2015 and prior studies FINDINGS: The cardiomediastinal silhouette is unchanged with prominence of the transverse and descending thoracic aorta again noted. CABG changes are present. COPD changes again noted. There is no evidence of focal airspace disease, pulmonary edema, suspicious pulmonary nodule/mass, pleural effusion, or pneumothorax. No acute bony abnormalities are identified. IMPRESSION: No evidence of acute abnormality or changes from prior study. Unchanged prominence of the transverse and descending thoracic aorta. Electronically Signed   By: Margarette Canada M.D.   On: 08/03/2015 09:32   I have personally  reviewed and evaluated these images and lab results as part of my medical decision-making.   EKG Interpretation   Date/Time:  Thursday August 03 2015 08:21:02 EDT Ventricular Rate:  119 PR Interval:    QRS Duration: 84 QT Interval:  332 QTC Calculation: 468 R Axis:   74 Text Interpretation:  Sinus tachycardia Probable left atrial enlargement  RSR' in V1 or V2, probably normal variant Nonspecific T abnormalities,  inferior leads Confirmed by Jaivian Battaglini MD, Maquita Sandoval ([id number]) on 08/03/2015  8:23:21 AM      MDM  Pt has improved, but he is still sob.  Pt d/w Dr. Jerilee Hoh ([facility]) who will admit pt.  Final diagnoses:  Acute exacerbation of chronic obstructive pulmonary disease (COPD) ([place])  Hypoxia    I personally performed the services described in this documentation, which was scribed in my presence. The recorded information has been reviewed and is accurate.   [name], MD 08/03/15 1351

## 2015-08-03 NOTE — Progress Notes (Signed)
Nutrition Brief Note  Patient identified on the Malnutrition Screening Tool (MST) Report. With score of 4-0, severe weight loss without trouble eating  Wt Readings from Last 15 Encounters:  08/03/15 150 lb (68.04 kg)  07/20/15 147 lb (66.679 kg)  05/15/15 115 lb 1.3 oz (52.2 kg)  04/09/15 128 lb 15.5 oz (58.5 kg)  03/24/15 126 lb 12.2 oz (57.5 kg)  03/15/15 136 lb 3.9 oz (61.8 kg)  12/04/14 155 lb (70.308 kg)  12/04/14 155 lb (70.308 kg)  11/14/14 150 lb 3.2 oz (68.13 kg)  04/29/14 151 lb 6.4 oz (68.675 kg)  02/07/14 140 lb (63.504 kg)  02/04/14 140 lb (63.504 kg)  01/07/14 147 lb (66.679 kg)  08/29/13 145 lb (65.772 kg)  07/01/13 143 lb (64.864 kg)   Body mass index is 21.52 kg/(m^2). Patient meets criteria for normal/healthy ht to wt based on current BMI.   When asked about pt's reported weight loss he said "I am on something for that.Marland KitchenMarland KitchenRemeron and Ensure twice a day". He confirms that his weight loss was over the course of a year. He says he weighed 215 lbs last year. This is not consistent with documentation.  Acutely, he has had PNA symptoms for 1 week. He says this has not affected his PO intake, but it just took him much longer to eat and he had some choking on his food.   RD asked if his wt gain diet was working and he says he has gained >20 lbs in the last few months. He is trying to eat 600 extra kcals more per day than he needs in order to gain 10 lbs a month.   Unsure where pt got his baseline kcal/pro needs from, but 600 extra kcals per day is only 5 lbs a month. Regardless, he has been successful with his weight regain. RD went over high kcal/pro foods which is usually a cheaper option to what he is doing. He asked a few questions about best food/drinks for weight gain.   Will order Ensure Enlive BID to mimic his home regimen.   Current diet order is Regular, patient is consuming approximately 100% of meals at this time. Labs and medications reviewed.   Pt says he  believes he will be discharged tomorrow.  If nutrition issues arise, please consult RD.   Burtis Junes RD, LDN, CNSC Clinical Nutrition Pager: [phone] 08/03/2015 4:07 PM

## 2015-08-03 NOTE — H&P (Signed)
History and Physical    [name] [id number] DOB: 1950-12-26 DOA: 08/03/2015  Referring MD/NP/PA: [name], EDP PCP: [name], MD  Patient coming from: Home  Chief Complaint: Shortness of breath, cough  HPI: [name] is a 65 y.o. male with history of COPD, thoracic aortic dissection, hypertension, coronary artery disease, hyperlipidemia presents to the hospital with the above complaints. He states it has been progressively getting worse over the course of the past week. This morning he started having a coughing spell has not been able to stop, is very difficult for him to catch his breath. He denies fevers or chills, recent travel, sick contacts, upper respiratory infection signs or symptoms. In the emergency department he failed to improve after initial nebulizer treatments and hence admission was requested for further evaluation and management. Of note his troponin is elevated at 0.05. He currently denies chest pain.  Past Medical/Surgical History: Past Medical History  Diagnosis Date  . Cardiac tamponade   . Aortic dissection (HCC)     TYPE 3  . HTN (hypertension)   . CAD (coronary artery disease)   . Hypercholesterolemia   . Chronic back pain   . Hiatal hernia   . Schatzki's ring   . Chronic fatigue   . Chronic pain   . Peptic ulcer disease 08/2010    EGD   . COPD (chronic obstructive pulmonary disease) ([place])   . Chronic bronchitis ([place])   . Dysphagia, oropharyngeal phase   . Anemia   . Anxiety   . GERD (gastroesophageal reflux disease)   . Constipation   . PNA (pneumonia)   . ETOH abuse   . Opioid abuse   . Depression   . Hyperlipidemia   . Metabolic encephalopathy   . Cardiac tamponade   . Peptic ulcer     Past Surgical History  Procedure Laterality Date  . Sternal wire removeal  12/31/2007    [facility]  . Cabg x 6  07/31/2007    [facility]  . Cardiac catheterization    . Knee surgery Right     acl  . Shoulder surgery Left   . Orif  scapular fracture Right   . Back surgery    . Olecranon bursectomy Left 08/27/2012    Procedure: EXCISION LEFT OLECRANON BURSA;  Surgeon: Sanjuana Kava, MD;  Location: [facility];  Service: Orthopedics;  Laterality: Left;  . Olecranon bursectomy Left 05/25/2013    Procedure:  Excision sinus tract and tissue left elbow ;  Surgeon: Sanjuana Kava, MD;  Location: [facility];  Service: Orthopedics;  Laterality: Left;  . Cardiac catheterization N/A 03/16/2015    Procedure: Coronary/Graft Angiography;  Surgeon: Sherren Mocha, MD;  Location: [facility];  Service: Cardiovascular;  Laterality: N/A;  . Cardiac catheterization N/A 03/16/2015    Procedure: Coronary Stent Intervention;  Surgeon: Sherren Mocha, MD;  Location: [facility];  Service: Cardiovascular;  Laterality: N/A;    Social History:  reports that he has never smoked. He has never used smokeless tobacco. He reports that he does not drink alcohol or use illicit drugs.  Allergies: Allergies  Allergen Reactions  . Bee Venom Swelling    Neck swelling, passes out  . Codeine Other (See Comments)    Causes GI Upset    Family History:  Family History  Problem Relation Age of Onset  . Heart disease Mother   . Heart disease Father     Prior to Admission medications   Medication Sig Start Date  End Date Taking? Authorizing Provider  acetaminophen (TYLENOL 8 HOUR ARTHRITIS PAIN) 650 MG CR tablet Take 650 mg by mouth every 4 (four) hours as needed for pain.   Yes Historical Provider, MD  ARIPiprazole (ABILIFY) 5 MG tablet Take 1 tablet (5 mg total) by mouth 2 (two) times daily. 05/16/15  Yes Barton Dubois, MD  aspirin 81 MG chewable tablet Chew 1 tablet (81 mg total) by mouth daily. 04/10/15  Yes [name], MD  atorvastatin (LIPITOR) 20 MG tablet Take 20 mg by mouth daily.   Yes Historical Provider, MD  EMBEDA 20-0.8 MG CPCR Take 1 tablet by mouth 2 (two) times daily. 07/26/15  Yes Historical Provider, MD  feeding supplement, ENSURE  ENLIVE, (ENSURE ENLIVE) LIQD Take 237 mLs by mouth 3 (three) times daily between meals. 05/16/15  Yes Barton Dubois, MD  metoprolol tartrate (LOPRESSOR) 25 MG tablet Take 0.5 tablets (12.5 mg total) by mouth 2 (two) times daily. 05/16/15  Yes Barton Dubois, MD  mirtazapine (REMERON) 15 MG tablet Take 1 tablet (15 mg total) by mouth at bedtime. 05/16/15  Yes Barton Dubois, MD  morphine (MSIR) 15 MG tablet Take 15 mg by mouth 2 (two) times daily. 07/21/15  Yes Historical Provider, MD  pantoprazole (PROTONIX) 40 MG tablet Take 1 tablet (40 mg total) by mouth at bedtime. 04/10/15  Yes [name], MD  ticagrelor (BRILINTA) 90 MG TABS tablet Take 1 tablet (90 mg total) by mouth 2 (two) times daily. 03/17/15  Yes Brett Canales, PA-C    Review of Systems:  Constitutional: Denies fever, chills, diaphoresis, appetite change and fatigue.  HEENT: Denies photophobia, eye pain, redness, hearing loss, ear pain, congestion, sore throat, rhinorrhea, sneezing, mouth sores, trouble swallowing, neck pain, neck stiffness and tinnitus.   Respiratory: Denies chest tightness, Cardiovascular: Denies chest pain, palpitations and leg swelling.  Gastrointestinal: Denies nausea, vomiting, abdominal pain, diarrhea, constipation, blood in stool and abdominal distention.  Genitourinary: Denies dysuria, urgency, frequency, hematuria, flank pain and difficulty urinating.  Endocrine: Denies: hot or cold intolerance, sweats, changes in hair or nails, polyuria, polydipsia. Musculoskeletal: Denies myalgias, back pain, joint swelling, arthralgias and gait problem.  Skin: Denies pallor, rash and wound.  Neurological: Denies dizziness, seizures, syncope, weakness, light-headedness, numbness and headaches.  Hematological: Denies adenopathy. Easy bruising, personal or family bleeding history  Psychiatric/Behavioral: Denies suicidal ideation, mood changes, confusion, nervousness, sleep disturbance and agitation    Physical Exam: Filed  Vitals:   08/03/15 1005 08/03/15 1030 08/03/15 1100 08/03/15 1207  BP:  158/89 136/88 150/86  Pulse:  88 93 99  Temp:    98.6 F (37 C)  TempSrc:    Oral  Resp:  21 16 16   Height:    5\' 10"  (1.778 m)  Weight:    68.04 kg (150 lb)  SpO2: 96% 95% 92% 99%     Constitutional: NAD, calm, comfortable Eyes: PERRL, lids and conjunctivae normal ENMT: Mucous membranes are moist. Posterior pharynx clear of any exudate or lesions.Normal dentition.  Neck: normal, supple, no masses, no thyromegaly Respiratory: Poor air movement, bilateral expiratory wheezing  Cardiovascular: Regular rate and rhythm, no murmurs / rubs / gallops. No extremity edema. 2+ pedal pulses. No carotid bruits.  Abdomen: no tenderness, no masses palpated. No hepatosplenomegaly. Bowel sounds positive.  Musculoskeletal: no clubbing / cyanosis. No joint deformity upper and lower extremities. Good ROM, no contractures. Normal muscle tone.  Skin: no rashes, lesions, ulcers. No induration Neurologic: CN 2-12 grossly intact. Sensation intact, DTR normal. Strength  5/5 in all 4.  Psychiatric: Normal judgment and insight. Alert and oriented x 3. Normal mood.    Labs on Admission: I have personally reviewed the following labs and imaging studies  CBC:  Recent Labs Lab 08/03/15 0851  WBC 8.5  NEUTROABS 3.9  HGB 12.8*  HCT 38.6*  MCV 97.5  PLT 123456   Basic Metabolic Panel:  Recent Labs Lab 08/03/15 0851  NA 138  K 3.7  CL 104  CO2 25  GLUCOSE 135*  BUN 11  CREATININE 0.73  CALCIUM 9.0   GFR: Estimated Creatinine Clearance: 90.9 mL/min (by C-G formula based on Cr of 0.73). Liver Function Tests:  Recent Labs Lab 08/03/15 0851  AST 20  ALT 11*  ALKPHOS 53  BILITOT 0.3  PROT 7.5  ALBUMIN 4.0   No results for input(s): LIPASE, AMYLASE in the last 168 hours. No results for input(s): AMMONIA in the last 168 hours. Coagulation Profile: No results for input(s): INR, PROTIME in the last 168 hours. Cardiac  Enzymes:  Recent Labs Lab 08/03/15 0851  TROPONINI 0.05*   BNP (last 3 results) No results for input(s): PROBNP in the last 8760 hours. HbA1C: No results for input(s): HGBA1C in the last 72 hours. CBG: No results for input(s): GLUCAP in the last 168 hours. Lipid Profile: No results for input(s): CHOL, HDL, LDLCALC, TRIG, CHOLHDL, LDLDIRECT in the last 72 hours. Thyroid Function Tests:  Recent Labs  08/03/15 0851  TSH 6.826*   Anemia Panel: No results for input(s): VITAMINB12, FOLATE, FERRITIN, TIBC, IRON, RETICCTPCT in the last 72 hours. Urine analysis:    Component Value Date/Time   COLORURINE YELLOW 05/11/2015 1612   APPEARANCEUR CLEAR 05/11/2015 1612   LABSPEC 1.013 05/11/2015 1612   PHURINE 5.0 05/11/2015 1612   GLUCOSEU NEGATIVE 05/11/2015 1612   HGBUR NEGATIVE 05/11/2015 1612   BILIRUBINUR NEGATIVE 05/11/2015 1612   KETONESUR NEGATIVE 05/11/2015 1612   PROTEINUR NEGATIVE 05/11/2015 1612   UROBILINOGEN 0.2 08/26/2012 0930   NITRITE NEGATIVE 05/11/2015 1612   LEUKOCYTESUR NEGATIVE 05/11/2015 1612   Sepsis Labs: @LABRCNTIP (procalcitonin:4,lacticidven:4) )No results found for this or any previous visit (from the past 240 hour(s)).   Radiological Exams on Admission: Dg Chest 2 View  08/03/2015  CLINICAL DATA:  65 year old male with shortness of breath for 4 days. Known descending thoracic aortic dissection. EXAM: CHEST  2 VIEW COMPARISON:  05/11/2015 and prior studies FINDINGS: The cardiomediastinal silhouette is unchanged with prominence of the transverse and descending thoracic aorta again noted. CABG changes are present. COPD changes again noted. There is no evidence of focal airspace disease, pulmonary edema, suspicious pulmonary nodule/mass, pleural effusion, or pneumothorax. No acute bony abnormalities are identified. IMPRESSION: No evidence of acute abnormality or changes from prior study. Unchanged prominence of the transverse and descending thoracic aorta.  Electronically Signed   By: Margarette Canada M.D.   On: 08/03/2015 09:32    EKG: Independently reviewed. No acute ischemic abnormalities  Assessment/Plan Principal Problem:   COPD with exacerbation (HCC) Active Problems:   Aortic dissection (HCC)   HTN (hypertension)   Hypercholesterolemia   Acute respiratory failure with hypoxia (HCC)   COPD exacerbation (HCC)    Acute hypoxemic respiratory failure -On account of COPD with acute exacerbation. -Admit to the hospital, frequent nebs, steroids, will optimize COPD regimen to include an inhaled steroid, a long-acting beta agonist as well as an anticholinergic. -Oxygen supplementation as required.  Elevated troponin -He currently denies any chest pain, suspect this is demand ischemia related to  his hypoxemia and COPD exacerbation. -Echocardiogram in March 2017 with ejection fraction of 55-60% and grade 1 diastolic dysfunction. -Cycle troponin for now.  Stanford type B aortic dissection -Per CT chest in March is chronic and unchanged. -Since he currently denies chest discomfort, do not believe we need to CT him again to follow. -Continue to strive for good blood pressure control.  Hypertension -Continue home medications, try to keep blood pressure under good control given aortic dissection.     DVT prophylaxis: Lovenox  Code Status: Full code  Family Communication: Patient only  Disposition Plan: To be determined  Consults called: None  Admission status: Inpatient    Time Spent: 75 minutes  Lelon Frohlich MD [facility] Pager [phone]  If 7PM-7AM, please contact night-coverage [email or web address] Password [facility]  08/03/2015, 3:59 PM

## 2015-08-03 NOTE — Progress Notes (Signed)
Patient asked at beginning of shift to have nicotine patch.  Patch stated that he has not smoked for two months and has been using nicotine patches 16mcg for past months. Patient then showed nurse discharge paper where he was on a nicotine patch 62mcg since April 25.  Called mid-level and explained situation.  Mid-level advised nurse to advise patient that since he was using nicotine patch and not smoking he could titrate himself down.  Explained this to patient and he was understanding.  Patient now resting in bed with no complaints or issues.

## 2015-08-03 NOTE — ED Notes (Signed)
Sob and cough times 4 days.  Denies any fever.

## 2015-08-03 NOTE — ED Notes (Signed)
CRITICAL VALUE ALERT  Critical value received:  Troponin 0.05  Date of notification:  08/03/15  Time of notification:  [id number]  Critical value read back:Yes.    Nurse who received alert:  Parthenia Ames  MD notified (1st page):  Dr. Gilford Raid  Time of first page:  934  MD notified (2nd page):  Time of second page:  Responding MD:  Dr. Gilford Raid  Time MD responded:  934

## 2015-08-04 LAB — BASIC METABOLIC PANEL
Anion gap: 6 (ref 5–15)
BUN: 20 mg/dL (ref 6–20)
CHLORIDE: 100 mmol/L — AB (ref 101–111)
CO2: 28 mmol/L (ref 22–32)
CREATININE: 0.7 mg/dL (ref 0.61–1.24)
Calcium: 8.9 mg/dL (ref 8.9–10.3)
GFR calc non Af Amer: >60 mL/min (ref >60)
GLUCOSE: 152 mg/dL — AB (ref 65–99)
Potassium: 4.3 mmol/L (ref 3.5–5.1)
Sodium: 134 mmol/L — ABNORMAL LOW (ref 135–145)

## 2015-08-04 LAB — CBC
HCT: 36.6 % — ABNORMAL LOW (ref 39.0–52.0)
Hemoglobin: 12.2 g/dL — ABNORMAL LOW (ref 13.0–17.0)
MCH: 32 pg (ref 26.0–34.0)
MCHC: 33.3 g/dL (ref 30.0–36.0)
MCV: 96.1 fL (ref 78.0–100.0)
PLATELETS: 204 10*3/uL (ref 150–400)
RBC: 3.81 MIL/uL — AB (ref 4.22–5.81)
RDW: 12.7 % (ref 11.5–15.5)
WBC: 8.9 10*3/uL (ref 4.0–10.5)

## 2015-08-04 LAB — TROPONIN I: Troponin I: 0.05 ng/mL (ref <0.03)

## 2015-08-04 MED ORDER — MORPHINE SULFATE 15 MG PO TABS
15.0000 mg | ORAL_TABLET | ORAL | Status: DC | PRN
  Administered 2015-08-04 – 2015-08-05 (×6): 15 mg via ORAL
  Filled 2015-08-04 (×6): qty 1

## 2015-08-04 MED ORDER — MORPHINE SULFATE ER 15 MG PO TBCR
15.0000 mg | EXTENDED_RELEASE_TABLET | Freq: Two times a day (BID) | ORAL | Status: DC
  Administered 2015-08-04 – 2015-08-06 (×4): 15 mg via ORAL
  Filled 2015-08-04 (×4): qty 1

## 2015-08-04 NOTE — Care Management Important Message (Signed)
Important Message  Patient Details  Name: [name] MRN: [id number] Date of Birth: [birth year]   Medicare Important Message Given:  Yes     Vasconcelos, Chauncey Reading, RN 08/04/2015, 1:58 PM

## 2015-08-04 NOTE — Care Management Note (Signed)
Case Management Note  Patient Details  Name: GRANDVILLE GALEY MRN: [id number] Date of Birth: October 04, 1950  Subjective/Objective: Patient adm with COPD exacerbation. Ind with ADL's. Has a PCP and reports no issues with obtaining medications.                    Action/Plan: Anticipate d/c home with self care. Will need to be weaned oxygen prior to d/c or have DME arranged. Patient has had [facility] in the past and would like to use them for oxygen in needed.    Expected Discharge Date:       08/05/2015           Expected Discharge Plan:  Home/Self Care  In-House Referral:  NA  Discharge planning Services  CM Consult  Post Acute Care Choice:    Choice offered to:     DME Arranged:    DME Agency:     HH Arranged:    HH Agency:     Status of Service:  In process, will continue to follow  If discussed at Long Length of Stay Meetings, dates discussed:    Additional Comments:  Lumina Gitto, Chauncey Reading, RN 08/04/2015, 2:06 PM

## 2015-08-04 NOTE — Progress Notes (Signed)
PROGRESS NOTE    ISIAHA [name]  [id number] DOB: 06-07-1950 DOA: 08/03/2015 PCP: [name], MD     Brief Narrative:  65 y/o man admitted from home on 7/13 with SOB. Admitted for COPD exacerbation.   Assessment & Plan:   Principal Problem:   COPD with exacerbation ([place]) Active Problems:   Aortic dissection (HCC)   HTN (hypertension)   Hypercholesterolemia   Acute respiratory failure with hypoxia (HCC)   COPD exacerbation (HCC)   Acute Hypoxemic Respiratory Failure -on account of COPD with acute exacerbation. -Seems improved, still wheezing altho improved air movement. -Plan to continue nebs and steroids. -May need oxygen upon DC.  Elevated troponin -Flat, no EKG changes. -Echocardiogram in March 2017 with ejection fraction of 55-60% and grade 1 diastolic dysfunction. -No further cardiac workup anticipated.  Stanford type B aortic dissection -Per CT chest in March is chronic and unchanged. -Since he currently denies chest discomfort, do not believe we need to CT him again to follow. -Continue to strive for good blood pressure control.  Hypertension -Continue home medications, try to keep blood pressure under good control given aortic dissection.  DVT prophylaxis: lovenox Code Status: full code Family Communication: patient only Disposition Plan: likely home in 48-72 hours  Consultants:   none  Procedures:   none  Antimicrobials:   None    Subjective: Feels improved, still SOB with ambulation  Objective: Filed Vitals:   08/04/15 0745 08/04/15 1245 08/04/15 1400 08/04/15 1627  BP:   135/77   Pulse:   74   Temp:   97.8 F (36.6 C)   TempSrc:   Oral   Resp:   18   Height:      Weight:      SpO2: 97% 96% 94% 96%    Intake/Output Summary (Last 24 hours) at 08/04/15 1711 Last data filed at 08/04/15 1219  Gross per 24 hour  Intake    480 ml  Output      0 ml  Net    480 ml   Filed Weights   08/03/15 0818 08/03/15 1207  Weight: 68.04  kg (150 lb) 68.04 kg (150 lb)    Examination:  General exam: Alert, awake, oriented x 3 Respiratory system: Increased air movement, bilateral exp wheezes. Cardiovascular system:RRR. No murmurs, rubs, gallops. Gastrointestinal system: Abdomen is nondistended, soft and nontender. No organomegaly or masses felt. Normal bowel sounds heard. Central nervous system: Alert and oriented. No focal neurological deficits. Extremities: No C/C/E, +pedal pulses Skin: No rashes, lesions or ulcers Psychiatry: Judgement and insight appear normal. Mood & affect appropriate.     Data Reviewed: I have personally reviewed following labs and imaging studies  CBC:  Recent Labs Lab 08/03/15 0851 08/04/15 0426  WBC 8.5 8.9  NEUTROABS 3.9  --   HGB 12.8* 12.2*  HCT 38.6* 36.6*  MCV 97.5 96.1  PLT 215 0000000   Basic Metabolic Panel:  Recent Labs Lab 08/03/15 0851 08/04/15 0426  NA 138 134*  K 3.7 4.3  CL 104 100*  CO2 25 28  GLUCOSE 135* 152*  BUN 11 20  CREATININE 0.73 0.70  CALCIUM 9.0 8.9   GFR: Estimated Creatinine Clearance: 90.9 mL/min (by C-G formula based on Cr of 0.7). Liver Function Tests:  Recent Labs Lab 08/03/15 0851  AST 20  ALT 11*  ALKPHOS 53  BILITOT 0.3  PROT 7.5  ALBUMIN 4.0   No results for input(s): LIPASE, AMYLASE in the last 168 hours. No results for input(s):  AMMONIA in the last 168 hours. Coagulation Profile: No results for input(s): INR, PROTIME in the last 168 hours. Cardiac Enzymes:  Recent Labs Lab 08/03/15 0851 08/03/15 1608 08/03/15 2152 08/04/15 0426  TROPONINI 0.05* 0.05* 0.05* 0.05*   BNP (last 3 results) No results for input(s): PROBNP in the last 8760 hours. HbA1C: No results for input(s): HGBA1C in the last 72 hours. CBG: No results for input(s): GLUCAP in the last 168 hours. Lipid Profile: No results for input(s): CHOL, HDL, LDLCALC, TRIG, CHOLHDL, LDLDIRECT in the last 72 hours. Thyroid Function Tests:  Recent Labs   08/03/15 0851  TSH 6.826*   Anemia Panel: No results for input(s): VITAMINB12, FOLATE, FERRITIN, TIBC, IRON, RETICCTPCT in the last 72 hours. Urine analysis:    Component Value Date/Time   COLORURINE YELLOW 05/11/2015 1612   APPEARANCEUR CLEAR 05/11/2015 1612   LABSPEC 1.013 05/11/2015 1612   PHURINE 5.0 05/11/2015 1612   GLUCOSEU NEGATIVE 05/11/2015 1612   HGBUR NEGATIVE 05/11/2015 1612   BILIRUBINUR NEGATIVE 05/11/2015 1612   KETONESUR NEGATIVE 05/11/2015 1612   PROTEINUR NEGATIVE 05/11/2015 1612   UROBILINOGEN 0.2 08/26/2012 0930   NITRITE NEGATIVE 05/11/2015 1612   LEUKOCYTESUR NEGATIVE 05/11/2015 1612   Sepsis Labs: @LABRCNTIP (procalcitonin:4,lacticidven:4)  ) Recent Results (from the past 240 hour(s))  MRSA PCR Screening     Status: Abnormal   Collection Time: 08/03/15  4:20 PM  Result Value Ref Range Status   MRSA by PCR POSITIVE (A) NEGATIVE Final    Comment:        The GeneXpert MRSA Assay (FDA approved for NASAL specimens only), is one component of a comprehensive MRSA colonization surveillance program. It is not intended to diagnose MRSA infection nor to guide or monitor treatment for MRSA infections. RESULT CALLED TO, READ BACK BY AND VERIFIED WITH: BROWER,B. AT 1859 ON 08/03/2015 BY BAUGHAM,M.          Radiology Studies: Dg Chest 2 View  08/03/2015  CLINICAL DATA:  65 year old male with shortness of breath for 4 days. Known descending thoracic aortic dissection. EXAM: CHEST  2 VIEW COMPARISON:  05/11/2015 and prior studies FINDINGS: The cardiomediastinal silhouette is unchanged with prominence of the transverse and descending thoracic aorta again noted. CABG changes are present. COPD changes again noted. There is no evidence of focal airspace disease, pulmonary edema, suspicious pulmonary nodule/mass, pleural effusion, or pneumothorax. No acute bony abnormalities are identified. IMPRESSION: No evidence of acute abnormality or changes from prior study.  Unchanged prominence of the transverse and descending thoracic aorta. Electronically Signed   By: Margarette Canada M.D.   On: 08/03/2015 09:32        Scheduled Meds: . ARIPiprazole  5 mg Oral BID  . aspirin  81 mg Oral Daily  . atorvastatin  20 mg Oral Daily  . Chlorhexidine Gluconate Cloth  6 each Topical Q0600  . enoxaparin (LOVENOX) injection  40 mg Subcutaneous Q24H  . feeding supplement (ENSURE ENLIVE)  237 mL Oral BID BM  . ipratropium-albuterol  3 mL Nebulization QID  . methylPREDNISolone (SOLU-MEDROL) injection  80 mg Intravenous Q6H  . metoprolol tartrate  12.5 mg Oral BID  . mirtazapine  15 mg Oral QHS  . mometasone-formoterol  2 puff Inhalation BID  . morphine  15 mg Oral Q12H  . mupirocin ointment  1 application Nasal BID  . nicotine  14 mg Transdermal QHS  . pantoprazole  40 mg Oral QHS  . sodium chloride flush  3 mL Intravenous Q12H  . ticagrelor  90 mg Oral BID  . tiotropium  18 mcg Inhalation Daily   Continuous Infusions:    LOS: 1 day    Time spent: 25 minutes. Greater than 50% of this time was spent in direct contact with the patient coordinating care.     Lelon Frohlich, MD [facility] Pager [phone]  If 7PM-7AM, please contact night-coverage [email or web address] Password [email or web address] 08/04/2015, 5:11 PM

## 2015-08-05 NOTE — Progress Notes (Signed)
PROGRESS NOTE    [name]  [id number] DOB: 1950/11/04 DOA: 08/03/2015 PCP: [name], MD     Brief Narrative:  65 y/o man admitted from home on 7/13 with SOB. Admitted for COPD exacerbation.   Assessment & Plan:   Principal Problem:   COPD with exacerbation ([place]) Active Problems:   Aortic dissection (HCC)   HTN (hypertension)   Hypercholesterolemia   Acute respiratory failure with hypoxia (HCC)   COPD exacerbation (HCC)   Acute Hypoxemic Respiratory Failure -on account of COPD with acute exacerbation. -Seems improved, still wheezing altho improved air movement. -Plan to continue nebs and steroids. -May need oxygen upon DC.  Elevated troponin -Flat, no EKG changes. -Echocardiogram in March 2017 with ejection fraction of 55-60% and grade 1 diastolic dysfunction. -No further cardiac workup anticipated.  Stanford type B aortic dissection -Per CT chest in March is chronic and unchanged. -Since he currently denies chest discomfort, do not believe we need to CT him again to follow. -Continue to strive for good blood pressure control.  Hypertension -Continue home medications, try to keep blood pressure under good control given aortic dissection.  DVT prophylaxis: lovenox Code Status: full code Family Communication: patient only Disposition Plan: likely home in 48-72 hours  Consultants:   none  Procedures:   none  Antimicrobials:   None    Subjective: Feels improved, still SOB with ambulation  Objective: Filed Vitals:   08/05/15 0801 08/05/15 1216 08/05/15 1503 08/05/15 1625  BP:   125/78   Pulse:   70   Temp:   97.7 F (36.5 C)   TempSrc:      Resp:   20   Height:      Weight:      SpO2: 97% 97% 98% 97%    Intake/Output Summary (Last 24 hours) at 08/05/15 1654 Last data filed at 08/05/15 1300  Gross per 24 hour  Intake   1062 ml  Output      0 ml  Net   1062 ml   Filed Weights   08/03/15 0818 08/03/15 1207  Weight: 68.04 kg  (150 lb) 68.04 kg (150 lb)    Examination:  General exam: Alert, awake, oriented x 3 Respiratory system: Increased air movement, bilateral exp wheezes. Cardiovascular system:RRR. No murmurs, rubs, gallops. Gastrointestinal system: Abdomen is nondistended, soft and nontender. No organomegaly or masses felt. Normal bowel sounds heard. Central nervous system: Alert and oriented. No focal neurological deficits. Extremities: No C/C/E, +pedal pulses Skin: No rashes, lesions or ulcers Psychiatry: Judgement and insight appear normal. Mood & affect appropriate.     Data Reviewed: I have personally reviewed following labs and imaging studies  CBC:  Recent Labs Lab 08/03/15 0851 08/04/15 0426  WBC 8.5 8.9  NEUTROABS 3.9  --   HGB 12.8* 12.2*  HCT 38.6* 36.6*  MCV 97.5 96.1  PLT 215 0000000   Basic Metabolic Panel:  Recent Labs Lab 08/03/15 0851 08/04/15 0426  NA 138 134*  K 3.7 4.3  CL 104 100*  CO2 25 28  GLUCOSE 135* 152*  BUN 11 20  CREATININE 0.73 0.70  CALCIUM 9.0 8.9   GFR: Estimated Creatinine Clearance: 90.9 mL/min (by C-G formula based on Cr of 0.7). Liver Function Tests:  Recent Labs Lab 08/03/15 0851  AST 20  ALT 11*  ALKPHOS 53  BILITOT 0.3  PROT 7.5  ALBUMIN 4.0   No results for input(s): LIPASE, AMYLASE in the last 168 hours. No results for input(s): AMMONIA in  the last 168 hours. Coagulation Profile: No results for input(s): INR, PROTIME in the last 168 hours. Cardiac Enzymes:  Recent Labs Lab 08/03/15 0851 08/03/15 1608 08/03/15 2152 08/04/15 0426  TROPONINI 0.05* 0.05* 0.05* 0.05*   BNP (last 3 results) No results for input(s): PROBNP in the last 8760 hours. HbA1C: No results for input(s): HGBA1C in the last 72 hours. CBG: No results for input(s): GLUCAP in the last 168 hours. Lipid Profile: No results for input(s): CHOL, HDL, LDLCALC, TRIG, CHOLHDL, LDLDIRECT in the last 72 hours. Thyroid Function Tests:  Recent Labs   08/03/15 0851  TSH 6.826*   Anemia Panel: No results for input(s): VITAMINB12, FOLATE, FERRITIN, TIBC, IRON, RETICCTPCT in the last 72 hours. Urine analysis:    Component Value Date/Time   COLORURINE YELLOW 05/11/2015 1612   APPEARANCEUR CLEAR 05/11/2015 1612   LABSPEC 1.013 05/11/2015 1612   PHURINE 5.0 05/11/2015 1612   GLUCOSEU NEGATIVE 05/11/2015 1612   HGBUR NEGATIVE 05/11/2015 1612   BILIRUBINUR NEGATIVE 05/11/2015 1612   KETONESUR NEGATIVE 05/11/2015 1612   PROTEINUR NEGATIVE 05/11/2015 1612   UROBILINOGEN 0.2 08/26/2012 0930   NITRITE NEGATIVE 05/11/2015 1612   LEUKOCYTESUR NEGATIVE 05/11/2015 1612   Sepsis Labs: @LABRCNTIP (procalcitonin:4,lacticidven:4)  ) Recent Results (from the past 240 hour(s))  Blood culture (routine x 2)     Status: None (Preliminary result)   Collection Time: 08/03/15  9:20 AM  Result Value Ref Range Status   Specimen Description BLOOD RIGHT ARM  Final   Special Requests   Final    BOTTLES DRAWN AEROBIC AND ANAEROBIC AEB=6CC ANA=4CC   Culture NO GROWTH 2 DAYS  Final   Report Status PENDING  Incomplete  Blood culture (routine x 2)     Status: None (Preliminary result)   Collection Time: 08/03/15  9:25 AM  Result Value Ref Range Status   Specimen Description BLOOD RIGHT FOREARM  Final   Special Requests BOTTLES DRAWN AEROBIC AND ANAEROBIC 5CC EACH  Final   Culture NO GROWTH 2 DAYS  Final   Report Status PENDING  Incomplete  MRSA PCR Screening     Status: Abnormal   Collection Time: 08/03/15  4:20 PM  Result Value Ref Range Status   MRSA by PCR POSITIVE (A) NEGATIVE Final    Comment:        The GeneXpert MRSA Assay (FDA approved for NASAL specimens only), is one component of a comprehensive MRSA colonization surveillance program. It is not intended to diagnose MRSA infection nor to guide or monitor treatment for MRSA infections. RESULT CALLED TO, READ BACK BY AND VERIFIED WITH: BROWER,B. AT 1859 ON 08/03/2015 BY BAUGHAM,M.           Radiology Studies: No results found.      Scheduled Meds: . ARIPiprazole  5 mg Oral BID  . aspirin  81 mg Oral Daily  . atorvastatin  20 mg Oral Daily  . Chlorhexidine Gluconate Cloth  6 each Topical Q0600  . enoxaparin (LOVENOX) injection  40 mg Subcutaneous Q24H  . feeding supplement (ENSURE ENLIVE)  237 mL Oral BID BM  . ipratropium-albuterol  3 mL Nebulization QID  . methylPREDNISolone (SOLU-MEDROL) injection  80 mg Intravenous Q6H  . metoprolol tartrate  12.5 mg Oral BID  . mirtazapine  15 mg Oral QHS  . mometasone-formoterol  2 puff Inhalation BID  . morphine  15 mg Oral Q12H  . mupirocin ointment  1 application Nasal BID  . nicotine  14 mg Transdermal QHS  . pantoprazole  40 mg Oral QHS  . sodium chloride flush  3 mL Intravenous Q12H  . ticagrelor  90 mg Oral BID  . tiotropium  18 mcg Inhalation Daily   Continuous Infusions:    LOS: 2 days    Time spent: 25 minutes. Greater than 50% of this time was spent in direct contact with the patient coordinating care.     Lelon Frohlich, MD [facility] Pager [phone]  If 7PM-7AM, please contact night-coverage [email or web address] Password [facility] 08/05/2015, 4:54 PM

## 2015-08-06 MED ORDER — TIOTROPIUM BROMIDE MONOHYDRATE 18 MCG IN CAPS: 18.0000 ug | ORAL_CAPSULE | Freq: Every day | RESPIRATORY_TRACT | Status: DC

## 2015-08-06 MED ORDER — BENZONATATE 100 MG PO CAPS: 100.0000 mg | ORAL_CAPSULE | Freq: Two times a day (BID) | ORAL | Status: DC | PRN

## 2015-08-06 MED ORDER — PREDNISONE 10 MG PO TABS: 10.0000 mg | ORAL_TABLET | Freq: Every day | ORAL | Status: DC

## 2015-08-06 MED ORDER — ALBUTEROL SULFATE (2.5 MG/3ML) 0.083% IN NEBU: 2.5000 mg | INHALATION_SOLUTION | RESPIRATORY_TRACT | Status: DC | PRN

## 2015-08-06 MED ORDER — FLUTICASONE-SALMETEROL 250-50 MCG/DOSE IN AEPB: 1.0000 | INHALATION_SPRAY | Freq: Two times a day (BID) | RESPIRATORY_TRACT | Status: DC

## 2015-08-06 NOTE — Progress Notes (Signed)
Patient's IV removed.  Site WNL.  AVS reviewed with patient and patient verbalized understanding of discharge instructions, physician follow-up, medications.  Prescriptions given to patient.  Dr. Jerilee Hoh aware that patient will not be getting medications filled until tomorrow.  Patient instructed to use Dulera inhaler until the Advair is filled.  Patient verbalized understanding.  [facility] delivered oxygen for transport home and is to meet patient at home for oxygen and nebulizer machine delivery.  Patient escorted by nurse to main entrance for discharge.  Patient stable at time of discharge.  Patient transported home by wife in private vehicle.

## 2015-08-06 NOTE — Discharge Summary (Signed)
Physician Discharge Summary  [name] [id number] DOB: 08-Dec-1950 DOA: 08/03/2015  PCP: [name], MD  Admit date: 08/03/2015 Discharge date: 08/06/2015  Time spent: 45 minutes  Recommendations for Outpatient Follow-up:  Will be discharged home today Advised to follow-up with primary care provider in 2 weeks Will be discharged on home oxygen   Discharge Diagnoses:  Principal Problem:   COPD with exacerbation [facility]) Active Problems:   Aortic dissection (HCC)   HTN (hypertension)   Hypercholesterolemia   Acute respiratory failure with hypoxia (HCC)   COPD exacerbation ([place])   Discharge Condition: Stable and improved  Filed Weights   08/03/15 0818 08/03/15 1207  Weight: 68.04 kg (150 lb) 68.04 kg (150 lb)    History of present illness:  [name] is a 65 y.o. male with history of COPD, thoracic aortic dissection, hypertension, coronary artery disease, hyperlipidemia presents to the hospital with the above complaints. He states it has been progressively getting worse over the course of the past week. This morning he started having a coughing spell has not been able to stop, is very difficult for him to catch his breath. He denies fevers or chills, recent travel, sick contacts, upper respiratory infection signs or symptoms. In the emergency department he failed to improve after initial nebulizer treatments and hence admission was requested for further evaluation and management. Of note his troponin is elevated at 0.05. He currently denies chest pain.  Hospital Course:   Acute Hypoxemic Respiratory Failure -on account of COPD with acute exacerbation. -Clinically improved. -Plan to continue nebs and steroid taper upon discharge. -We'll need oxygen upon discharge due to ambulating sats dropping into the 70s.  Elevated troponin -Flat, no EKG changes. -Echocardiogram in March 2017 with ejection fraction of 55-60% and grade 1 diastolic dysfunction. -No further cardiac  workup anticipated.  Stanford type B aortic dissection -Per CT chest in March is chronic and unchanged. -Since he currently denies chest discomfort, do not believe we need to CT him again to follow. -Continue to strive for good blood pressure control.  Hypertension -Continue home medications, try to keep blood pressure under good control given aortic dissection.  Procedures:  None   Consultations:  None  Discharge Instructions  Discharge Instructions    Diet - low sodium heart healthy    Complete by:  As directed      Increase activity slowly    Complete by:  As directed             Medication List    TAKE these medications        albuterol (2.5 MG/3ML) 0.083% nebulizer solution  Commonly known as:  PROVENTIL  Take 3 mLs (2.5 mg total) by nebulization every 2 (two) hours as needed for wheezing or shortness of breath.     ARIPiprazole 5 MG tablet  Commonly known as:  ABILIFY  Take 1 tablet (5 mg total) by mouth 2 (two) times daily.     aspirin 81 MG chewable tablet  Chew 1 tablet (81 mg total) by mouth daily.     atorvastatin 20 MG tablet  Commonly known as:  LIPITOR  Take 20 mg by mouth daily.     benzonatate 100 MG capsule  Commonly known as:  TESSALON  Take 1 capsule (100 mg total) by mouth 2 (two) times daily as needed for cough.     EMBEDA 20-0.8 MG Cpcr  Generic drug:  Morphine-Naltrexone  Take 1 tablet by mouth 2 (two) times daily.  feeding supplement (ENSURE ENLIVE) Liqd  Take 237 mLs by mouth 3 (three) times daily between meals.     Fluticasone-Salmeterol 250-50 MCG/DOSE Aepb  Commonly known as:  ADVAIR DISKUS  Inhale 1 puff into the lungs 2 (two) times daily.     metoprolol tartrate 25 MG tablet  Commonly known as:  LOPRESSOR  Take 0.5 tablets (12.5 mg total) by mouth 2 (two) times daily.     mirtazapine 15 MG tablet  Commonly known as:  REMERON  Take 1 tablet (15 mg total) by mouth at bedtime.     morphine 15 MG tablet  Commonly  known as:  MSIR  Take 15 mg by mouth 2 (two) times daily.     pantoprazole 40 MG tablet  Commonly known as:  PROTONIX  Take 1 tablet (40 mg total) by mouth at bedtime.     predniSONE 10 MG tablet  Commonly known as:  DELTASONE  Take 1 tablet (10 mg total) by mouth daily with breakfast. Take 6 tablets today and then decrease by 1 tablet daily until none are left.     ticagrelor 90 MG Tabs tablet  Commonly known as:  BRILINTA  Take 1 tablet (90 mg total) by mouth 2 (two) times daily.     tiotropium 18 MCG inhalation capsule  Commonly known as:  SPIRIVA  Place 1 capsule (18 mcg total) into inhaler and inhale daily.     TYLENOL 8 HOUR ARTHRITIS PAIN 650 MG CR tablet  Generic drug:  acetaminophen  Take 650 mg by mouth every 4 (four) hours as needed for pain.       Allergies  Allergen Reactions  . Bee Venom Swelling    Neck swelling, passes out  . Codeine Other (See Comments)    Causes GI Upset       Follow-up Information    Follow up with [name], MD. Schedule an appointment as soon as possible for a visit in 2 weeks.   Specialty:  Gastroenterology   Contact information:   [address] [place] [address] [phone]        The results of significant diagnostics from this hospitalization (including imaging, microbiology, ancillary and laboratory) are listed below for reference.    Significant Diagnostic Studies: Dg Chest 2 View  08/03/2015  CLINICAL DATA:  65 year old male with shortness of breath for 4 days. Known descending thoracic aortic dissection. EXAM: CHEST  2 VIEW COMPARISON:  05/11/2015 and prior studies FINDINGS: The cardiomediastinal silhouette is unchanged with prominence of the transverse and descending thoracic aorta again noted. CABG changes are present. COPD changes again noted. There is no evidence of focal airspace disease, pulmonary edema, suspicious pulmonary nodule/mass, pleural effusion, or pneumothorax. No acute bony  abnormalities are identified. IMPRESSION: No evidence of acute abnormality or changes from prior study. Unchanged prominence of the transverse and descending thoracic aorta. Electronically Signed   By: Margarette Canada M.D.   On: 08/03/2015 09:32    Microbiology: Recent Results (from the past 240 hour(s))  Blood culture (routine x 2)     Status: None (Preliminary result)   Collection Time: 08/03/15  9:20 AM  Result Value Ref Range Status   Specimen Description BLOOD RIGHT ARM  Final   Special Requests   Final    BOTTLES DRAWN AEROBIC AND ANAEROBIC AEB=6CC ANA=4CC   Culture NO GROWTH 3 DAYS  Final   Report Status PENDING  Incomplete  Blood culture (routine x 2)  Status: None (Preliminary result)   Collection Time: 08/03/15  9:25 AM  Result Value Ref Range Status   Specimen Description BLOOD RIGHT FOREARM  Final   Special Requests BOTTLES DRAWN AEROBIC AND ANAEROBIC 5CC EACH  Final   Culture NO GROWTH 3 DAYS  Final   Report Status PENDING  Incomplete  MRSA PCR Screening     Status: Abnormal   Collection Time: 08/03/15  4:20 PM  Result Value Ref Range Status   MRSA by PCR POSITIVE (A) NEGATIVE Final    Comment:        The GeneXpert MRSA Assay (FDA approved for NASAL specimens only), is one component of a comprehensive MRSA colonization surveillance program. It is not intended to diagnose MRSA infection nor to guide or monitor treatment for MRSA infections. RESULT CALLED TO, READ BACK BY AND VERIFIED WITH: BROWER,B. AT 1859 ON 08/03/2015 BY BAUGHAM,M.      Labs: Basic Metabolic Panel:  Recent Labs Lab 08/03/15 0851 08/04/15 0426  NA 138 134*  K 3.7 4.3  CL 104 100*  CO2 25 28  GLUCOSE 135* 152*  BUN 11 20  CREATININE 0.73 0.70  CALCIUM 9.0 8.9   Liver Function Tests:  Recent Labs Lab 08/03/15 0851  AST 20  ALT 11*  ALKPHOS 53  BILITOT 0.3  PROT 7.5  ALBUMIN 4.0   No results for input(s): LIPASE, AMYLASE in the last 168 hours. No results for input(s):  AMMONIA in the last 168 hours. CBC:  Recent Labs Lab 08/03/15 0851 08/04/15 0426  WBC 8.5 8.9  NEUTROABS 3.9  --   HGB 12.8* 12.2*  HCT 38.6* 36.6*  MCV 97.5 96.1  PLT 215 204   Cardiac Enzymes:  Recent Labs Lab 08/03/15 0851 08/03/15 1608 08/03/15 2152 08/04/15 0426  TROPONINI 0.05* 0.05* 0.05* 0.05*   BNP: BNP (last 3 results) No results for input(s): BNP in the last 8760 hours.  ProBNP (last 3 results) No results for input(s): PROBNP in the last 8760 hours.  CBG: No results for input(s): GLUCAP in the last 168 hours.     SignedLelon Frohlich  [facility] Pager: [phone] 08/06/2015, 3:24 PM

## 2015-08-06 NOTE — Progress Notes (Addendum)
SATURATION QUALIFICATIONS: (This note is used to comply with regulatory documentation for home oxygen)  Patient Saturations on Room Air at Rest = 94%  Patient Saturations on Room Air while Ambulating = 70%  Patient Saturations on 2 Liters of oxygen while Ambulating = 98%  Please briefly explain why patient needs home oxygen:  Patient desats when on RA with exertion.  Dr. Jerilee Hoh and Maudie Mercury, Case Manager notified.

## 2015-08-06 NOTE — Progress Notes (Signed)
CM received information and faxed to [facility] for oxygen.  Left number to representative from [facility] to contact on unit for any questions.

## 2015-08-08 LAB — CULTURE, BLOOD (ROUTINE X 2)
CULTURE: NO GROWTH
Culture: NO GROWTH

## 2015-11-16 ENCOUNTER — Encounter (HOSPITAL_COMMUNITY): Payer: Self-pay | Admitting: Emergency Medicine

## 2015-11-16 ENCOUNTER — Inpatient Hospital Stay (HOSPITAL_COMMUNITY)
Admission: EM | Admit: 2015-11-16 | Discharge: 2015-11-20 | DRG: 189 | Disposition: A | Discharge status: Home Health | Payer: Medicare PPO | Attending: Internal Medicine | Admitting: Internal Medicine

## 2015-11-16 ENCOUNTER — Emergency Department (HOSPITAL_COMMUNITY): Payer: Medicare PPO

## 2015-11-16 DIAGNOSIS — Z8679 Personal history of other diseases of the circulatory system: Secondary | ICD-10-CM

## 2015-11-16 DIAGNOSIS — J441 Chronic obstructive pulmonary disease with (acute) exacerbation: Secondary | ICD-10-CM | POA: Diagnosis present

## 2015-11-16 DIAGNOSIS — Z8711 Personal history of peptic ulcer disease: Secondary | ICD-10-CM

## 2015-11-16 DIAGNOSIS — Z6820 Body mass index (BMI) 20.0-20.9, adult: Secondary | ICD-10-CM

## 2015-11-16 DIAGNOSIS — F419 Anxiety disorder, unspecified: Secondary | ICD-10-CM | POA: Diagnosis present

## 2015-11-16 DIAGNOSIS — F329 Major depressive disorder, single episode, unspecified: Secondary | ICD-10-CM | POA: Diagnosis present

## 2015-11-16 DIAGNOSIS — Z79891 Long term (current) use of opiate analgesic: Secondary | ICD-10-CM

## 2015-11-16 DIAGNOSIS — E876 Hypokalemia: Secondary | ICD-10-CM | POA: Diagnosis present

## 2015-11-16 DIAGNOSIS — Z7902 Long term (current) use of antithrombotics/antiplatelets: Secondary | ICD-10-CM

## 2015-11-16 DIAGNOSIS — I10 Essential (primary) hypertension: Secondary | ICD-10-CM | POA: Diagnosis present

## 2015-11-16 DIAGNOSIS — Z79899 Other long term (current) drug therapy: Secondary | ICD-10-CM

## 2015-11-16 DIAGNOSIS — Z951 Presence of aortocoronary bypass graft: Secondary | ICD-10-CM

## 2015-11-16 DIAGNOSIS — Z87891 Personal history of nicotine dependence: Secondary | ICD-10-CM

## 2015-11-16 DIAGNOSIS — F32A Depression, unspecified: Secondary | ICD-10-CM | POA: Diagnosis present

## 2015-11-16 DIAGNOSIS — Z7982 Long term (current) use of aspirin: Secondary | ICD-10-CM

## 2015-11-16 DIAGNOSIS — E039 Hypothyroidism, unspecified: Secondary | ICD-10-CM | POA: Diagnosis present

## 2015-11-16 DIAGNOSIS — E7800 Pure hypercholesterolemia, unspecified: Secondary | ICD-10-CM | POA: Diagnosis present

## 2015-11-16 DIAGNOSIS — R739 Hyperglycemia, unspecified: Secondary | ICD-10-CM | POA: Diagnosis present

## 2015-11-16 DIAGNOSIS — E440 Moderate protein-calorie malnutrition: Secondary | ICD-10-CM | POA: Diagnosis present

## 2015-11-16 DIAGNOSIS — Z91030 Bee allergy status: Secondary | ICD-10-CM

## 2015-11-16 DIAGNOSIS — Z9981 Dependence on supplemental oxygen: Secondary | ICD-10-CM

## 2015-11-16 DIAGNOSIS — J9620 Acute and chronic respiratory failure, unspecified whether with hypoxia or hypercapnia: Secondary | ICD-10-CM | POA: Diagnosis not present

## 2015-11-16 DIAGNOSIS — M549 Dorsalgia, unspecified: Secondary | ICD-10-CM | POA: Diagnosis present

## 2015-11-16 DIAGNOSIS — F17210 Nicotine dependence, cigarettes, uncomplicated: Secondary | ICD-10-CM | POA: Diagnosis present

## 2015-11-16 DIAGNOSIS — G8929 Other chronic pain: Secondary | ICD-10-CM | POA: Diagnosis present

## 2015-11-16 DIAGNOSIS — I2510 Atherosclerotic heart disease of native coronary artery without angina pectoris: Secondary | ICD-10-CM | POA: Diagnosis present

## 2015-11-16 DIAGNOSIS — K219 Gastro-esophageal reflux disease without esophagitis: Secondary | ICD-10-CM | POA: Diagnosis present

## 2015-11-16 DIAGNOSIS — Z885 Allergy status to narcotic agent status: Secondary | ICD-10-CM

## 2015-11-16 LAB — BASIC METABOLIC PANEL
Anion gap: 9 (ref 5–15)
BUN: 15 mg/dL (ref 6–20)
CHLORIDE: 98 mmol/L — AB (ref 101–111)
CO2: 28 mmol/L (ref 22–32)
Calcium: 8.9 mg/dL (ref 8.9–10.3)
Creatinine, Ser: 0.75 mg/dL (ref 0.61–1.24)
Glucose, Bld: 109 mg/dL — ABNORMAL HIGH (ref 65–99)
POTASSIUM: 3.2 mmol/L — AB (ref 3.5–5.1)
Sodium: 135 mmol/L (ref 135–145)

## 2015-11-16 LAB — CBC
HEMATOCRIT: 36.9 % — AB (ref 39.0–52.0)
Hemoglobin: 12.3 g/dL — ABNORMAL LOW (ref 13.0–17.0)
MCH: 31.2 pg (ref 26.0–34.0)
MCHC: 33.3 g/dL (ref 30.0–36.0)
MCV: 93.7 fL (ref 78.0–100.0)
PLATELETS: 179 10*3/uL (ref 150–400)
RBC: 3.94 MIL/uL — AB (ref 4.22–5.81)
RDW: 13.7 % (ref 11.5–15.5)
WBC: 7.3 10*3/uL (ref 4.0–10.5)

## 2015-11-16 MED ORDER — POTASSIUM CHLORIDE CRYS ER 20 MEQ PO TBCR
40.0000 meq | EXTENDED_RELEASE_TABLET | Freq: Once | ORAL | Status: AC
  Administered 2015-11-17: 40 meq via ORAL
  Filled 2015-11-16: qty 2

## 2015-11-16 MED ORDER — IPRATROPIUM BROMIDE 0.02 % IN SOLN
0.5000 mg | Freq: Once | RESPIRATORY_TRACT | Status: AC
  Administered 2015-11-16: 0.5 mg via RESPIRATORY_TRACT
  Filled 2015-11-16: qty 2.5

## 2015-11-16 MED ORDER — METHYLPREDNISOLONE SODIUM SUCC 125 MG IJ SOLR
125.0000 mg | Freq: Once | INTRAMUSCULAR | Status: AC
Start: 2015-11-16 — End: 2015-11-16
  Administered 2015-11-16: 125 mg via INTRAVENOUS
  Filled 2015-11-16: qty 2

## 2015-11-16 MED ORDER — ALBUTEROL (5 MG/ML) CONTINUOUS INHALATION SOLN
15.0000 mg/h | INHALATION_SOLUTION | Freq: Once | RESPIRATORY_TRACT | Status: AC
  Administered 2015-11-16: 15 mg/h via RESPIRATORY_TRACT
  Filled 2015-11-16: qty 20

## 2015-11-16 MED ORDER — ALBUTEROL SULFATE (2.5 MG/3ML) 0.083% IN NEBU: 5.0000 mg | INHALATION_SOLUTION | Freq: Once | RESPIRATORY_TRACT | Status: DC

## 2015-11-16 NOTE — ED Provider Notes (Signed)
[facility] DEPT Provider Note   CSN: [id number] Arrival date & time: 11/16/15  2246 By signing my name below, I, Dyke Brackett, attest that this documentation has been prepared under the direction and in the presence of Rolland Porter, MD . Electronically Signed: Dyke Brackett, Scribe. 11/17/2015. 1:47 AM.   Time seen 23:04 PM  History   Chief Complaint Chief Complaint  Patient presents with  . Shortness of Breath   HPI [name] is a 65 y.o. male with hx of COPD who presents to the Emergency Department complaining of shortness of breath onset a few days ago. It is exacerbated by exertion. He notes some associated intermittent rare dry cough, wheezing, and chest tightness. Pt has some rhinorrhea which he states is normal for him because he is on oxygen.  He has used his albuterol inhaler with relief, but states he has to use it more often than normal. He is on 3L O2 at home, but states he has had to use it continuously for the last few days due to his SOB. He was admitted for COPD exacerbation on 08/03/15 and states this feels as severe. He endorses some tobacco use. Pt does not drink alcohol. He denies fever, sore throat, or leg swelling.  The history is provided by the patient. No language interpreter was used.   PCP Dr Mellody Memos  Past Medical History:  Diagnosis Date  . Anemia   . Anxiety   . Aortic dissection (HCC)    TYPE 3  . CAD (coronary artery disease)   . Cardiac tamponade   . Cardiac tamponade   . Chronic back pain   . Chronic bronchitis ([place])   . Chronic fatigue   . Chronic pain   . Constipation   . COPD (chronic obstructive pulmonary disease) ([place])   . Depression   . Dysphagia, oropharyngeal phase   . ETOH abuse   . GERD (gastroesophageal reflux disease)   . Hiatal hernia   . HTN (hypertension)   . Hypercholesterolemia   . Hyperlipidemia   . Metabolic encephalopathy   . Opioid abuse   . Peptic ulcer   . Peptic ulcer disease 08/2010   EGD   . PNA  (pneumonia)   . Schatzki's ring     Patient Active Problem List   Diagnosis Date Noted  . COPD with exacerbation ([place]) 08/03/2015  . COPD exacerbation ([place]) 08/03/2015  . Orthostatic hypotension   . Acute kidney injury ([place])   . Altered mental status 05/11/2015  . AKI (acute kidney injury) ([place]) 05/11/2015  . Decubitus ulcer of back, stage 2 05/11/2015  . GERD without esophagitis 05/11/2015  . Severe protein-calorie malnutrition ([place]) 05/11/2015  . Disorientation 05/11/2015  . Malnutrition of moderate degree 04/04/2015  . Acute respiratory failure with hypoxia ([place])   . Aspiration into airway   . Respiratory failure with hypoxia ([place]) 04/03/2015  . Descending thoracic dissection ([place])   . Elevated troponin   . HCAP (healthcare-associated pneumonia)   . Lactic acidosis   . Tylenol toxicity   . Faintness   . Chest pain at rest 03/23/2015  . NSTEMI (non-ST elevated myocardial infarction) ([place])   . Chest pain 03/14/2015  . Elevated troponin 03/14/2015  . Acute myocardial infarction, subendocardial infarction, subsequent episode of care ([place]) 10/26/2014  . Angina pectoris ([place]) 10/26/2014  . COPD mixed type ([place]) 10/26/2014  . Arteriosclerosis of autologous vein coronary artery bypass graft 10/26/2014  . Arteriosclerosis of coronary artery 10/26/2014  . H/O adenomatous polyp  of colon 10/26/2014  . Hypercholesterolemia without hypertriglyceridemia 10/26/2014  . LBP (low back pain) 10/26/2014  . Tobacco abuse 10/26/2014  . Depression 04/05/2013  . Cardiac tamponade   . Aortic dissection ([place])   . HTN (hypertension)   . CAD (coronary artery disease)   . Hypercholesterolemia   . Chronic back pain   . Schatzki's ring     Past Surgical History:  Procedure Laterality Date  . BACK SURGERY    . CABG X 6  07/31/2007   [facility]  . CARDIAC CATHETERIZATION    . CARDIAC CATHETERIZATION N/A 03/16/2015   Procedure: Coronary/Graft Angiography;  Surgeon: Sherren Mocha, MD;   Location: [facility];  Service: Cardiovascular;  Laterality: N/A;  . CARDIAC CATHETERIZATION N/A 03/16/2015   Procedure: Coronary Stent Intervention;  Surgeon: Sherren Mocha, MD;  Location: [facility];  Service: Cardiovascular;  Laterality: N/A;  . KNEE SURGERY Right    acl  . OLECRANON BURSECTOMY Left 08/27/2012   Procedure: EXCISION LEFT OLECRANON BURSA;  Surgeon: Sanjuana Kava, MD;  Location: [facility];  Service: Orthopedics;  Laterality: Left;  . OLECRANON BURSECTOMY Left 05/25/2013   Procedure:  Excision sinus tract and tissue left elbow ;  Surgeon: Sanjuana Kava, MD;  Location: [facility];  Service: Orthopedics;  Laterality: Left;  . ORIF SCAPULAR FRACTURE Right   . SHOULDER SURGERY Left   . STERNAL WIRE REMOVEAL  12/31/2007   [facility]       Home Medications    Prior to Admission medications   Medication Sig Start Date End Date Taking? Authorizing Provider  acetaminophen (TYLENOL 8 HOUR ARTHRITIS PAIN) 650 MG CR tablet Take 650 mg by mouth every 4 (four) hours as needed for pain.    Historical Provider, MD  albuterol (PROVENTIL) (2.5 MG/3ML) 0.083% nebulizer solution Take 3 mLs (2.5 mg total) by nebulization every 2 (two) hours as needed for wheezing or shortness of breath. 08/06/15   Erline Hau, MD  ARIPiprazole (ABILIFY) 5 MG tablet Take 1 tablet (5 mg total) by mouth 2 (two) times daily. 05/16/15   Barton Dubois, MD  aspirin 81 MG chewable tablet Chew 1 tablet (81 mg total) by mouth daily. 04/10/15   [name], MD  atorvastatin (LIPITOR) 20 MG tablet Take 20 mg by mouth daily.    Historical Provider, MD  benzonatate (TESSALON) 100 MG capsule Take 1 capsule (100 mg total) by mouth 2 (two) times daily as needed for cough. 08/06/15   Erline Hau, MD  EMBEDA 20-0.8 MG CPCR Take 1 tablet by mouth 2 (two) times daily. 07/26/15   Historical Provider, MD  feeding supplement, ENSURE ENLIVE, (ENSURE ENLIVE) LIQD Take 237 mLs by mouth 3 (three) times daily  between meals. 05/16/15   Barton Dubois, MD  Fluticasone-Salmeterol (ADVAIR DISKUS) 250-50 MCG/DOSE AEPB Inhale 1 puff into the lungs 2 (two) times daily. 08/06/15   Erline Hau, MD  metoprolol tartrate (LOPRESSOR) 25 MG tablet Take 0.5 tablets (12.5 mg total) by mouth 2 (two) times daily. 05/16/15   Barton Dubois, MD  mirtazapine (REMERON) 15 MG tablet Take 1 tablet (15 mg total) by mouth at bedtime. 05/16/15   Barton Dubois, MD  morphine (MSIR) 15 MG tablet Take 15 mg by mouth 2 (two) times daily. 07/21/15   Historical Provider, MD  pantoprazole (PROTONIX) 40 MG tablet Take 1 tablet (40 mg total) by mouth at bedtime. 04/10/15   [name], MD  predniSONE (DELTASONE) 10 MG tablet Take 1  tablet (10 mg total) by mouth daily with breakfast. Take 6 tablets today and then decrease by 1 tablet daily until none are left. 08/06/15   Erline Hau, MD  ticagrelor (BRILINTA) 90 MG TABS tablet Take 1 tablet (90 mg total) by mouth 2 (two) times daily. 03/17/15   Brett Canales, PA-C  tiotropium (SPIRIVA) 18 MCG inhalation capsule Place 1 capsule (18 mcg total) into inhaler and inhale daily. 08/06/15   Estela Leonie Green, MD    Family History Family History  Problem Relation Age of Onset  . Heart disease Mother   . Heart disease Father     Social History Social History  Substance Use Topics  . Smoking status: Former Smoker    Packs/day: 1.00    Years: 20.00    Types: Cigarettes  . Smokeless tobacco: Former Systems developer    Quit date: 06/05/2015     Comment: smoked one ppd "on and off" for 20 yrs. quit in May 17  . Alcohol use No  smoking  Lives with spouse Oxygen 3 lpm [place]   Allergies   Bee venom and Codeine   Review of Systems Review of Systems  Constitutional: Negative for fever.  HENT: Negative for sore throat.   Respiratory: Positive for cough, chest tightness, shortness of breath and wheezing.   Cardiovascular: Negative for leg swelling.  All other systems reviewed  and are negative.  Physical Exam Updated Vital Signs BP (!) 165/104 (BP Location: Left Arm)   Pulse 90   Temp 97.7 F (36.5 C) (Oral)   Resp 20   Ht 5' 10.5" (1.791 m)   Wt 160 lb (72.6 kg)   SpO2 100%   BMI 22.63 kg/m   Vital signs normal except for hypertension   Physical Exam  Constitutional: He is oriented to person, place, and time. He appears well-developed and well-nourished.  Non-toxic appearance. He does not appear ill. No distress.  HENT:  Head: Normocephalic and atraumatic.  Right Ear: External ear normal.  Left Ear: External ear normal.  Nose: Nose normal. No mucosal edema or rhinorrhea.  Mouth/Throat: Oropharynx is clear and moist and mucous membranes are normal. No dental abscesses or uvula swelling.  Eyes: Conjunctivae and EOM are normal. Pupils are equal, round, and reactive to light.  Neck: Normal range of motion and full passive range of motion without pain. Neck supple.  Cardiovascular: Normal rate, regular rhythm and normal heart sounds.  Exam reveals no gallop and no friction rub.   No murmur heard. Pulmonary/Chest: Accessory muscle usage present. Tachypnea noted. No respiratory distress. He has wheezes. He has no rhonchi. He has no rales. He exhibits no tenderness and no crepitus.  Diffuse, prolonged, audible expiratory wheezing. Retractions are present.   Abdominal: Soft. Normal appearance and bowel sounds are normal. He exhibits no distension. There is no tenderness. There is no rebound and no guarding.  Musculoskeletal: Normal range of motion. He exhibits no edema or tenderness.  Neurological: He is alert and oriented to person, place, and time. He has normal strength. No cranial nerve deficit.  Skin: Skin is warm, dry and intact. No rash noted. No erythema. No pallor.  Psychiatric: He has a normal mood and affect. His speech is normal and behavior is normal. His mood appears not anxious.  Nursing note and vitals reviewed.   ED Treatments / Results    DIAGNOSTIC STUDIES:  Oxygen Saturation is 100% on RA, normal by my interpretation.    COORDINATION OF CARE:  11:10 PM Discussed treatment plan with pt at bedside and pt agreed to plan.  Labs (all labs ordered are listed, but only abnormal results are displayed) Results for orders placed or performed during the hospital encounter of [id number]  Basic metabolic panel  Result Value Ref Range   Sodium 135 135 - 145 mmol/L   Potassium 3.2 (L) 3.5 - 5.1 mmol/L   Chloride 98 (L) 101 - 111 mmol/L   CO2 28 22 - 32 mmol/L   Glucose, Bld 109 (H) 65 - 99 mg/dL   BUN 15 6 - 20 mg/dL   Creatinine, Ser 0.75 0.61 - 1.24 mg/dL   Calcium 8.9 8.9 - 10.3 mg/dL   GFR calc non Af Amer >60 >60 mL/min   GFR calc Af Amer >60 >60 mL/min   Anion gap 9 5 - 15  CBC  Result Value Ref Range   WBC 7.3 4.0 - 10.5 K/uL   RBC 3.94 (L) 4.22 - 5.81 MIL/uL   Hemoglobin 12.3 (L) 13.0 - 17.0 g/dL   HCT 36.9 (L) 39.0 - 52.0 %   MCV 93.7 78.0 - 100.0 fL   MCH 31.2 26.0 - 34.0 pg   MCHC 33.3 30.0 - 36.0 g/dL   RDW 13.7 11.5 - 15.5 %   Platelets 179 150 - 400 K/uL   Laboratory interpretation all normal except for hypokalemia, mild anemia    EKG  EKG Interpretation  Date/Time:  Thursday November 16 2015 22:54:32 EDT Ventricular Rate:  94 PR Interval:  154 QRS Duration: 82 QT Interval:  378 QTC Calculation: 472 R Axis:   63 Text Interpretation:  Sinus rhythm with Premature atrial complexes Possible Left atrial enlargement Nonspecific ST and T wave abnormality Prolonged QT No significant change since last tracing 03 Aug 2015 Confirmed by Marcelina Mclaurin  MD-I, Tianah Lonardo ([id number]) on 11/16/2015 10:58:53 PM       Radiology Dg Chest 2 View  Result Date: 11/16/2015 CLINICAL DATA:  65 year old male with shortness of breath. EXAM: CHEST  2 VIEW COMPARISON:  Chest radiograph dated 08/03/2015 FINDINGS: There is emphysematous changes of the lungs with hyperinflation. No focal consolidation, pleural effusion, or pneumothorax. The  cardiac silhouette is within normal limits. The aorta is tortuous. CABG clips in noted along the mediastinum similar to prior radiograph. The descending thoracic aorta measures up to 3.7 cm in diameter. The aortic arch measures approximately 6 cm in diameter. These findings are similar to the prior radiograph. No acute osseous pathology. IMPRESSION: No acute cardiopulmonary process. COPD changes. Prominence of the aortic arch and descending thoracic aorta similar to the prior radiograph. Electronically Signed   By: Anner Crete M.D.   On: 11/16/2015 23:42    Procedures Procedures (including critical care time)  Medications Ordered in ED Medications  ondansetron (ZOFRAN) 4 MG/2ML injection (not administered)  albuterol (PROVENTIL,VENTOLIN) solution continuous neb (15 mg/hr Nebulization Given 11/16/15 2327)  ipratropium (ATROVENT) nebulizer solution 0.5 mg (0.5 mg Nebulization Given 11/16/15 2327)  methylPREDNISolone sodium succinate (SOLU-MEDROL) 125 mg/2 mL injection 125 mg (125 mg Intravenous Given 11/16/15 2340)  potassium chloride SA (K-DUR,KLOR-CON) CR tablet 40 mEq (40 mEq Oral Given 11/17/15 0020)  albuterol (PROVENTIL) (2.5 MG/3ML) 0.083% nebulizer solution 5 mg (5 mg Nebulization Given 11/17/15 0043)  ipratropium (ATROVENT) nebulizer solution 0.5 mg (0.5 mg Nebulization Given 11/17/15 0043)  oxyCODONE-acetaminophen (PERCOCET/ROXICET) 5-325 MG per tablet 1 tablet (1 tablet Oral Given 11/17/15 0059)  ondansetron (ZOFRAN) injection 4 mg (4 mg Intravenous Given 11/17/15 0111)   Initial Impression /  Assessment and Plan / ED Course  I have reviewed the triage vital signs and the nursing notes.  Pertinent labs & imaging results that were available during my care of the patient were reviewed by me and considered in my medical decision making (see chart for details).  Pt was started on a continuous nebulizer and IV steroids.   After reviewing his labs he was given oral potassium.  12:30  AM Clinical Course  Comment By Time  He walked to the bathroom and became more dyspneic. On lung exam, he has diminished breath sounds now with diffuse wheezing. Retractions are worse. He appears more distressed. We discussed admission and he is agreeable. Pt is at the end of his continuous nebulizer, will add a single and talk to hospitalist about admission. Dyke Brackett 10/27 0030   01:45 AM Dr Lorin Mercy, hospitalist, admit to med-surg  Final Clinical Impressions(s) / ED Diagnoses   Final diagnoses:  COPD exacerbation ([place])   Plan admission  Rolland Porter, MD, FACEP  CRITICAL CARE Performed by: Dvontae Ruan L Nolia Tschantz Total critical care time: 35 minutes Critical care time was exclusive of separately billable procedures and treating other patients. Critical care was necessary to treat or prevent imminent or life-threatening deterioration. Critical care was time spent personally by me on the following activities: development of treatment plan with patient and/or surrogate as well as nursing, discussions with consultants, evaluation of patient's response to treatment, examination of patient, obtaining history from patient or surrogate, ordering and performing treatments and interventions, ordering and review of laboratory studies, ordering and review of radiographic studies, pulse oximetry and re-evaluation of patient's condition.  Rolland Porter, MD, FACEP  I personally performed the services described in this documentation, which was scribed in my presence. The recorded information has been reviewed and considered.  Rolland Porter, MD, Barbette Or, MD 11/17/15 [phone]

## 2015-11-16 NOTE — ED Triage Notes (Signed)
Pt c/o increased sob over the last couple of days. Pt is on 3lpm continuous o2 at home.

## 2015-11-17 ENCOUNTER — Encounter (HOSPITAL_COMMUNITY): Payer: Self-pay | Admitting: Internal Medicine

## 2015-11-17 DIAGNOSIS — E440 Moderate protein-calorie malnutrition: Secondary | ICD-10-CM | POA: Diagnosis present

## 2015-11-17 DIAGNOSIS — Z885 Allergy status to narcotic agent status: Secondary | ICD-10-CM | POA: Diagnosis not present

## 2015-11-17 DIAGNOSIS — J9621 Acute and chronic respiratory failure with hypoxia: Secondary | ICD-10-CM | POA: Diagnosis not present

## 2015-11-17 DIAGNOSIS — R739 Hyperglycemia, unspecified: Secondary | ICD-10-CM | POA: Diagnosis present

## 2015-11-17 DIAGNOSIS — M549 Dorsalgia, unspecified: Secondary | ICD-10-CM | POA: Diagnosis present

## 2015-11-17 DIAGNOSIS — J9622 Acute and chronic respiratory failure with hypercapnia: Secondary | ICD-10-CM

## 2015-11-17 DIAGNOSIS — K219 Gastro-esophageal reflux disease without esophagitis: Secondary | ICD-10-CM | POA: Diagnosis present

## 2015-11-17 DIAGNOSIS — Z79899 Other long term (current) drug therapy: Secondary | ICD-10-CM | POA: Diagnosis not present

## 2015-11-17 DIAGNOSIS — Z951 Presence of aortocoronary bypass graft: Secondary | ICD-10-CM | POA: Diagnosis not present

## 2015-11-17 DIAGNOSIS — E039 Hypothyroidism, unspecified: Secondary | ICD-10-CM | POA: Diagnosis present

## 2015-11-17 DIAGNOSIS — Z8711 Personal history of peptic ulcer disease: Secondary | ICD-10-CM | POA: Diagnosis not present

## 2015-11-17 DIAGNOSIS — I2510 Atherosclerotic heart disease of native coronary artery without angina pectoris: Secondary | ICD-10-CM | POA: Diagnosis present

## 2015-11-17 DIAGNOSIS — I10 Essential (primary) hypertension: Secondary | ICD-10-CM | POA: Diagnosis present

## 2015-11-17 DIAGNOSIS — E7800 Pure hypercholesterolemia, unspecified: Secondary | ICD-10-CM | POA: Diagnosis present

## 2015-11-17 DIAGNOSIS — Z7902 Long term (current) use of antithrombotics/antiplatelets: Secondary | ICD-10-CM | POA: Diagnosis not present

## 2015-11-17 DIAGNOSIS — G8929 Other chronic pain: Secondary | ICD-10-CM | POA: Diagnosis present

## 2015-11-17 DIAGNOSIS — J9620 Acute and chronic respiratory failure, unspecified whether with hypoxia or hypercapnia: Secondary | ICD-10-CM | POA: Diagnosis present

## 2015-11-17 DIAGNOSIS — Z79891 Long term (current) use of opiate analgesic: Secondary | ICD-10-CM | POA: Diagnosis not present

## 2015-11-17 DIAGNOSIS — J441 Chronic obstructive pulmonary disease with (acute) exacerbation: Secondary | ICD-10-CM | POA: Diagnosis present

## 2015-11-17 DIAGNOSIS — E876 Hypokalemia: Secondary | ICD-10-CM | POA: Diagnosis present

## 2015-11-17 DIAGNOSIS — Z6820 Body mass index (BMI) 20.0-20.9, adult: Secondary | ICD-10-CM | POA: Diagnosis not present

## 2015-11-17 DIAGNOSIS — F419 Anxiety disorder, unspecified: Secondary | ICD-10-CM | POA: Diagnosis present

## 2015-11-17 DIAGNOSIS — Z87891 Personal history of nicotine dependence: Secondary | ICD-10-CM | POA: Diagnosis not present

## 2015-11-17 DIAGNOSIS — F329 Major depressive disorder, single episode, unspecified: Secondary | ICD-10-CM | POA: Diagnosis present

## 2015-11-17 DIAGNOSIS — Z7982 Long term (current) use of aspirin: Secondary | ICD-10-CM | POA: Diagnosis not present

## 2015-11-17 DIAGNOSIS — Z9981 Dependence on supplemental oxygen: Secondary | ICD-10-CM | POA: Diagnosis not present

## 2015-11-17 LAB — BASIC METABOLIC PANEL
Anion gap: 9 (ref 5–15)
BUN: 16 mg/dL (ref 6–20)
CHLORIDE: 99 mmol/L — AB (ref 101–111)
CO2: 28 mmol/L (ref 22–32)
Calcium: 8.8 mg/dL — ABNORMAL LOW (ref 8.9–10.3)
Creatinine, Ser: 0.71 mg/dL (ref 0.61–1.24)
GFR calc Af Amer: >60 mL/min (ref >60)
GFR calc non Af Amer: >60 mL/min (ref >60)
GLUCOSE: 188 mg/dL — AB (ref 65–99)
POTASSIUM: 3.2 mmol/L — AB (ref 3.5–5.1)
Sodium: 136 mmol/L (ref 135–145)

## 2015-11-17 LAB — CBC
HEMATOCRIT: 35.7 % — AB (ref 39.0–52.0)
Hemoglobin: 11.7 g/dL — ABNORMAL LOW (ref 13.0–17.0)
MCH: 30.7 pg (ref 26.0–34.0)
MCHC: 32.8 g/dL (ref 30.0–36.0)
MCV: 93.7 fL (ref 78.0–100.0)
Platelets: 181 10*3/uL (ref 150–400)
RBC: 3.81 MIL/uL — ABNORMAL LOW (ref 4.22–5.81)
RDW: 13.1 % (ref 11.5–15.5)
WBC: 6.2 10*3/uL (ref 4.0–10.5)

## 2015-11-17 LAB — T4, FREE: Free T4: 0.68 ng/dL (ref 0.61–1.12)

## 2015-11-17 LAB — TSH: TSH: 5.291 u[IU]/mL — AB (ref 0.350–4.500)

## 2015-11-17 LAB — MRSA PCR SCREENING: MRSA by PCR: POSITIVE — AB

## 2015-11-17 MED ORDER — GUAIFENESIN-DM 100-10 MG/5ML PO SYRP
5.0000 mL | ORAL_SOLUTION | ORAL | Status: DC | PRN
  Administered 2015-11-17 – 2015-11-18 (×5): 5 mL via ORAL
  Filled 2015-11-17 (×4): qty 5

## 2015-11-17 MED ORDER — IPRATROPIUM BROMIDE 0.02 % IN SOLN
0.5000 mg | Freq: Once | RESPIRATORY_TRACT | Status: AC
  Administered 2015-11-17: 0.5 mg via RESPIRATORY_TRACT
  Filled 2015-11-17: qty 2.5

## 2015-11-17 MED ORDER — TICAGRELOR 90 MG PO TABS
90.0000 mg | ORAL_TABLET | Freq: Two times a day (BID) | ORAL | Status: DC
  Administered 2015-11-18 – 2015-11-20 (×4): 90 mg via ORAL
  Filled 2015-11-17 (×11): qty 1

## 2015-11-17 MED ORDER — METHYLPREDNISOLONE SODIUM SUCC 125 MG IJ SOLR
80.0000 mg | Freq: Three times a day (TID) | INTRAMUSCULAR | Status: DC
  Administered 2015-11-17 – 2015-11-20 (×9): 80 mg via INTRAVENOUS
  Filled 2015-11-17 (×9): qty 2

## 2015-11-17 MED ORDER — ARIPIPRAZOLE 10 MG PO TABS
5.0000 mg | ORAL_TABLET | Freq: Two times a day (BID) | ORAL | Status: DC
  Administered 2015-11-17 – 2015-11-20 (×7): 5 mg via ORAL
  Filled 2015-11-17 (×7): qty 1

## 2015-11-17 MED ORDER — OXYCODONE-ACETAMINOPHEN 5-325 MG PO TABS
1.0000 | ORAL_TABLET | ORAL | Status: DC | PRN
Start: 2015-11-17 — End: 2015-11-19
  Administered 2015-11-17 – 2015-11-19 (×7): 1 via ORAL
  Filled 2015-11-17 (×7): qty 1

## 2015-11-17 MED ORDER — BENZONATATE 100 MG PO CAPS
100.0000 mg | ORAL_CAPSULE | Freq: Two times a day (BID) | ORAL | Status: DC | PRN
  Administered 2015-11-17: 100 mg via ORAL
  Filled 2015-11-17: qty 1

## 2015-11-17 MED ORDER — MUPIROCIN 2 % EX OINT
1.0000 "application " | TOPICAL_OINTMENT | Freq: Two times a day (BID) | CUTANEOUS | Status: DC
  Administered 2015-11-17 – 2015-11-20 (×7): 1 via NASAL
  Filled 2015-11-17 (×2): qty 22

## 2015-11-17 MED ORDER — BOOST / RESOURCE BREEZE PO LIQD
1.0000 | ORAL | Status: DC
  Administered 2015-11-17 – 2015-11-20 (×4): 1 via ORAL

## 2015-11-17 MED ORDER — TRAZODONE HCL 50 MG PO TABS
50.0000 mg | ORAL_TABLET | Freq: Every day | ORAL | Status: DC
  Administered 2015-11-17 – 2015-11-19 (×3): 50 mg via ORAL
  Filled 2015-11-17 (×3): qty 1

## 2015-11-17 MED ORDER — ONDANSETRON HCL 4 MG/2ML IJ SOLN
4.0000 mg | Freq: Once | INTRAMUSCULAR | Status: AC
  Administered 2015-11-17: 4 mg via INTRAVENOUS

## 2015-11-17 MED ORDER — ATORVASTATIN CALCIUM 20 MG PO TABS
20.0000 mg | ORAL_TABLET | Freq: Every day | ORAL | Status: DC
  Administered 2015-11-17 – 2015-11-20 (×4): 20 mg via ORAL
  Filled 2015-11-17 (×4): qty 1

## 2015-11-17 MED ORDER — ACETAMINOPHEN 325 MG PO TABS
650.0000 mg | ORAL_TABLET | Freq: Four times a day (QID) | ORAL | Status: DC | PRN
  Administered 2015-11-17: 650 mg via ORAL
  Filled 2015-11-17: qty 2

## 2015-11-17 MED ORDER — ENOXAPARIN SODIUM 40 MG/0.4ML ~~LOC~~ SOLN
40.0000 mg | SUBCUTANEOUS | Status: DC
  Administered 2015-11-18 – 2015-11-20 (×3): 40 mg via SUBCUTANEOUS
  Filled 2015-11-17 (×4): qty 0.4

## 2015-11-17 MED ORDER — ASPIRIN 81 MG PO CHEW
81.0000 mg | CHEWABLE_TABLET | Freq: Every day | ORAL | Status: DC
  Administered 2015-11-17 – 2015-11-20 (×4): 81 mg via ORAL
  Filled 2015-11-17 (×4): qty 1

## 2015-11-17 MED ORDER — POTASSIUM CHLORIDE CRYS ER 20 MEQ PO TBCR
60.0000 meq | EXTENDED_RELEASE_TABLET | Freq: Four times a day (QID) | ORAL | Status: AC
  Administered 2015-11-17 (×2): 60 meq via ORAL
  Filled 2015-11-17 (×2): qty 3

## 2015-11-17 MED ORDER — PANTOPRAZOLE SODIUM 40 MG PO TBEC
40.0000 mg | DELAYED_RELEASE_TABLET | Freq: Every day | ORAL | Status: DC
  Administered 2015-11-17 – 2015-11-19 (×3): 40 mg via ORAL
  Filled 2015-11-17 (×3): qty 1

## 2015-11-17 MED ORDER — METHYLPREDNISOLONE SODIUM SUCC 125 MG IJ SOLR
80.0000 mg | Freq: Two times a day (BID) | INTRAMUSCULAR | Status: DC
  Administered 2015-11-17: 80 mg via INTRAVENOUS
  Filled 2015-11-17: qty 2

## 2015-11-17 MED ORDER — ONDANSETRON HCL 4 MG/2ML IJ SOLN
INTRAMUSCULAR | Status: AC
  Filled 2015-11-17: qty 2

## 2015-11-17 MED ORDER — OXYCODONE-ACETAMINOPHEN 5-325 MG PO TABS
1.0000 | ORAL_TABLET | Freq: Once | ORAL | Status: AC
  Administered 2015-11-17: 1 via ORAL
  Filled 2015-11-17: qty 1

## 2015-11-17 MED ORDER — METOPROLOL TARTRATE 25 MG PO TABS
12.5000 mg | ORAL_TABLET | Freq: Two times a day (BID) | ORAL | Status: DC
Start: 2015-11-17 — End: 2015-11-20
  Administered 2015-11-17 – 2015-11-20 (×7): 12.5 mg via ORAL
  Filled 2015-11-17 (×8): qty 1

## 2015-11-17 MED ORDER — ENSURE ENLIVE PO LIQD: 237.0000 mL | Freq: Three times a day (TID) | ORAL | Status: DC

## 2015-11-17 MED ORDER — IPRATROPIUM-ALBUTEROL 0.5-2.5 (3) MG/3ML IN SOLN
3.0000 mL | Freq: Four times a day (QID) | RESPIRATORY_TRACT | Status: DC
  Administered 2015-11-17 (×3): 3 mL via RESPIRATORY_TRACT
  Filled 2015-11-17 (×3): qty 3

## 2015-11-17 MED ORDER — CHLORHEXIDINE GLUCONATE CLOTH 2 % EX PADS
6.0000 | MEDICATED_PAD | Freq: Every day | CUTANEOUS | Status: DC
  Administered 2015-11-17 – 2015-11-20 (×3): 6 via TOPICAL

## 2015-11-17 MED ORDER — IPRATROPIUM-ALBUTEROL 0.5-2.5 (3) MG/3ML IN SOLN
3.0000 mL | Freq: Three times a day (TID) | RESPIRATORY_TRACT | Status: DC
  Administered 2015-11-18 – 2015-11-20 (×6): 3 mL via RESPIRATORY_TRACT
  Filled 2015-11-17 (×7): qty 3

## 2015-11-17 MED ORDER — ALBUTEROL SULFATE (2.5 MG/3ML) 0.083% IN NEBU
5.0000 mg | INHALATION_SOLUTION | Freq: Once | RESPIRATORY_TRACT | Status: AC
  Administered 2015-11-17: 5 mg via RESPIRATORY_TRACT
  Filled 2015-11-17: qty 6

## 2015-11-17 MED ORDER — NICOTINE 14 MG/24HR TD PT24
14.0000 mg | MEDICATED_PATCH | Freq: Every day | TRANSDERMAL | Status: DC
  Administered 2015-11-17 – 2015-11-20 (×4): 14 mg via TRANSDERMAL
  Filled 2015-11-17 (×4): qty 1

## 2015-11-17 MED ORDER — ACETAMINOPHEN 650 MG RE SUPP: 650.0000 mg | Freq: Four times a day (QID) | RECTAL | Status: DC | PRN

## 2015-11-17 MED ORDER — ALBUTEROL SULFATE (2.5 MG/3ML) 0.083% IN NEBU
2.5000 mg | INHALATION_SOLUTION | RESPIRATORY_TRACT | Status: DC | PRN
  Administered 2015-11-17 – 2015-11-19 (×2): 2.5 mg via RESPIRATORY_TRACT
  Filled 2015-11-17 (×2): qty 3

## 2015-11-17 MED ORDER — ENSURE ENLIVE PO LIQD
237.0000 mL | ORAL | Status: DC
  Administered 2015-11-17 – 2015-11-19 (×3): 237 mL via ORAL

## 2015-11-17 MED ORDER — ONDANSETRON HCL 4 MG PO TABS
4.0000 mg | ORAL_TABLET | Freq: Four times a day (QID) | ORAL | Status: DC | PRN
  Administered 2015-11-18: 4 mg via ORAL
  Filled 2015-11-17 (×2): qty 1

## 2015-11-17 MED ORDER — MORPHINE SULFATE ER 15 MG PO TBCR
15.0000 mg | EXTENDED_RELEASE_TABLET | Freq: Two times a day (BID) | ORAL | Status: DC
  Administered 2015-11-17 – 2015-11-20 (×7): 15 mg via ORAL
  Filled 2015-11-17 (×7): qty 1

## 2015-11-17 MED ORDER — MORPHINE-NALTREXONE 20-0.8 MG PO CPCR: 1.0000 | ORAL_CAPSULE | Freq: Two times a day (BID) | ORAL | Status: DC

## 2015-11-17 MED ORDER — ONDANSETRON HCL 4 MG/2ML IJ SOLN
4.0000 mg | Freq: Four times a day (QID) | INTRAMUSCULAR | Status: DC | PRN
  Administered 2015-11-17 – 2015-11-20 (×6): 4 mg via INTRAVENOUS
  Filled 2015-11-17 (×6): qty 2

## 2015-11-17 MED ORDER — MIRTAZAPINE 15 MG PO TABS
15.0000 mg | ORAL_TABLET | Freq: Every day | ORAL | Status: DC
  Administered 2015-11-17 – 2015-11-19 (×3): 15 mg via ORAL
  Filled 2015-11-17 (×3): qty 1

## 2015-11-17 MED ORDER — FUROSEMIDE 10 MG/ML IJ SOLN
40.0000 mg | Freq: Once | INTRAMUSCULAR | Status: AC
  Administered 2015-11-17: 40 mg via INTRAVENOUS
  Filled 2015-11-17: qty 4

## 2015-11-17 MED ORDER — DOCUSATE SODIUM 100 MG PO CAPS
100.0000 mg | ORAL_CAPSULE | Freq: Two times a day (BID) | ORAL | Status: DC
  Administered 2015-11-17 – 2015-11-20 (×7): 100 mg via ORAL
  Filled 2015-11-17 (×7): qty 1

## 2015-11-17 NOTE — ED Notes (Signed)
Pt ambulated to restroom. Pt became SOB w/ sats at 87%. Pt placed back on oxygen & sats returned to 99%. Pt requesting pain meds for his back.

## 2015-11-17 NOTE — Progress Notes (Signed)
Pt a/o.vss. Saline lock patent. Patient medicated for pain. Report called to Ander Purpura, RN and verbalized understanding of report.  Pt to be transferred to room 335 via wheelchair with nursing staff.

## 2015-11-17 NOTE — Care Management Important Message (Signed)
Important Message  Patient Details  Name: [name] MRN: [id number] Date of Birth: Nov 21, 1950   Medicare Important Message Given:  Yes    Sherald Barge, RN 11/17/2015, 8:42 AM

## 2015-11-17 NOTE — Progress Notes (Signed)
PROGRESS NOTE  [name]  [id number] DOB: Sep 10, 1950 DOA: 11/16/2015 PCP: [name], MD Outpatient Specialists:  Subjective: Complaining about intractable cough with minimal sputum production. Denies any fever or chills, still has some shortness of breath.  Brief Narrative:  [name] is a 65 y.o. male with medical history significant of chronic respiratory failure from COPD, HTN, HLD, thoracic aortic dissection, and CAD presenting with SOB.  He reports that he "can't breathe."  Bad the last 3 days, got to where he was out of breath with minimal exertion/talking.  Wears 3L home O2 intermittently, but continuous the last 3 days.  Some cough, nonproductive, unchanged from baseline.  No sick contacts.  No fevers.    Initially reported that he quit smoking 3 months ago.  When asked if he needed a patch, he said he did.  Upon further questioning, the patient has not really quit smoking.  Assessment & Plan:   Principal Problem:   Acute on chronic respiratory failure (HCC) Active Problems:   HTN (hypertension)   Hypercholesterolemia   Chronic back pain   Depression   Tobacco abuse   COPD exacerbation (HCC)   Hypokalemia   Hyperglycemia   Acute on chronic respiratory failure resulting from COPD with exacerbation -Patient's shortness of breath is most likely caused by acute COPD exacerbation.  -Chest x-ray is not consistent with pneumonia  -He is on 3 L of oxygen at home, has chronic hypoxic respiratory failure secondary to COPD. -Started on IV antibiotics and Solu-Medrol. -Continue supportive management with bronchodilators, mucolytics, antitussives and oxygen as needed.  HTN -Continue Lopressor  HLD -Continue Lipitor  Depression -Questionable diagnosis based on chronic meds, although he does have a blunted affect and appears somewhat depressed -Continue Abilify and Remeron for now -Elevated TSH in April and July, will recheck TSH and T4  Tobacco  dependence -Encourage cessation.  This was discussed with the patient and should be reviewed on an ongoing basis.  -Patch ordered at patient request.  Hypokalemia -Repleted with oral supplements, check in a.m.  Hyperglycemia -No recent A1c, will check  Chronic pain -Admitting physician reviewed this patient in the [place] Controlled Substances Reporting System.   -He is receiving his medications from only one provider and appears to be taking them as prescribed.  Will continue home meds as ordered.   DVT prophylaxis: Lovenox Code Status: Full Code Family Communication:  Disposition Plan:  Diet: Diet Heart Room service appropriate? Yes; Fluid consistency: Thin  Consultants:   None  Procedures:   None  Antimicrobials:   None   Objective: Vitals:   11/17/15 1000 11/17/15 1100 11/17/15 1129 11/17/15 1200  BP: (!) 142/94 135/88  (!) 142/102  Pulse: 100 88  96  Resp: 14 18    Temp:      TempSrc:      SpO2: 99% 97% 97% 98%  Weight:      Height:       No intake or output data in the 24 hours ending 11/17/15 1349 Filed Weights   11/16/15 2249 11/17/15 0300 11/17/15 0400  Weight: 72.6 kg (160 lb) 69.7 kg (153 lb 10.6 oz) 64.7 kg (142 lb 10.2 oz)    Examination: General exam: Appears calm and comfortable  Respiratory system: Clear to auscultation. Respiratory effort normal. Cardiovascular system: S1 & S2 heard, RRR. No JVD, murmurs, rubs, gallops or clicks. No pedal edema. Gastrointestinal system: Abdomen is nondistended, soft and nontender. No organomegaly or masses felt. Normal bowel sounds heard. Central nervous  system: Alert and oriented. No focal neurological deficits. Extremities: Symmetric 5 x 5 power. Skin: No rashes, lesions or ulcers Psychiatry: Judgement and insight appear normal. Mood & affect appropriate.   Data Reviewed: I have personally reviewed following labs and imaging studies  CBC:  Recent Labs Lab 11/16/15 2309 11/17/15 0501  WBC 7.3 6.2   HGB 12.3* 11.7*  HCT 36.9* 35.7*  MCV 93.7 93.7  PLT 179 0000000   Basic Metabolic Panel:  Recent Labs Lab 11/16/15 2309 11/17/15 0501  NA 135 136  K 3.2* 3.2*  CL 98* 99*  CO2 28 28  GLUCOSE 109* 188*  BUN 15 16  CREATININE 0.75 0.71  CALCIUM 8.9 8.8*   GFR: Estimated Creatinine Clearance: 85.4 mL/min (by C-G formula based on SCr of 0.71 mg/dL). Liver Function Tests: No results for input(s): AST, ALT, ALKPHOS, BILITOT, PROT, ALBUMIN in the last 168 hours. No results for input(s): LIPASE, AMYLASE in the last 168 hours. No results for input(s): AMMONIA in the last 168 hours. Coagulation Profile: No results for input(s): INR, PROTIME in the last 168 hours. Cardiac Enzymes: No results for input(s): CKTOTAL, CKMB, CKMBINDEX, TROPONINI in the last 168 hours. BNP (last 3 results) No results for input(s): PROBNP in the last 8760 hours. HbA1C: No results for input(s): HGBA1C in the last 72 hours. CBG: No results for input(s): GLUCAP in the last 168 hours. Lipid Profile: No results for input(s): CHOL, HDL, LDLCALC, TRIG, CHOLHDL, LDLDIRECT in the last 72 hours. Thyroid Function Tests:  Recent Labs  11/16/15 0239 11/16/15 2309  TSH 5.291*  --   FREET4  --  0.68   Anemia Panel: No results for input(s): VITAMINB12, FOLATE, FERRITIN, TIBC, IRON, RETICCTPCT in the last 72 hours. Urine analysis:    Component Value Date/Time   COLORURINE YELLOW 05/11/2015 1612   APPEARANCEUR CLEAR 05/11/2015 1612   LABSPEC 1.013 05/11/2015 1612   PHURINE 5.0 05/11/2015 1612   GLUCOSEU NEGATIVE 05/11/2015 1612   HGBUR NEGATIVE 05/11/2015 1612   BILIRUBINUR NEGATIVE 05/11/2015 1612   KETONESUR NEGATIVE 05/11/2015 1612   PROTEINUR NEGATIVE 05/11/2015 1612   UROBILINOGEN 0.2 08/26/2012 0930   NITRITE NEGATIVE 05/11/2015 1612   LEUKOCYTESUR NEGATIVE 05/11/2015 1612   Sepsis Labs: @LABRCNTIP (procalcitonin:4,lacticidven:4)  ) Recent Results (from the past 240 hour(s))  MRSA PCR Screening      Status: Abnormal   Collection Time: 11/17/15  3:28 AM  Result Value Ref Range Status   MRSA by PCR POSITIVE (A) NEGATIVE Final    Comment: RESULT CALLED TO, READ BACK BY AND VERIFIED WITH: SPANGLER, E AT 0900 BY HFLYNT 11/17/15        The GeneXpert MRSA Assay (FDA approved for NASAL specimens only), is one component of a comprehensive MRSA colonization surveillance program. It is not intended to diagnose MRSA infection nor to guide or monitor treatment for MRSA infections.      Invalid input(s): PROCALCITONIN, LACTICACIDVEN   Radiology Studies: Dg Chest 2 View  Result Date: 11/16/2015 CLINICAL DATA:  65 year old male with shortness of breath. EXAM: CHEST  2 VIEW COMPARISON:  Chest radiograph dated 08/03/2015 FINDINGS: There is emphysematous changes of the lungs with hyperinflation. No focal consolidation, pleural effusion, or pneumothorax. The cardiac silhouette is within normal limits. The aorta is tortuous. CABG clips in noted along the mediastinum similar to prior radiograph. The descending thoracic aorta measures up to 3.7 cm in diameter. The aortic arch measures approximately 6 cm in diameter. These findings are similar to the prior radiograph. No acute  osseous pathology. IMPRESSION: No acute cardiopulmonary process. COPD changes. Prominence of the aortic arch and descending thoracic aorta similar to the prior radiograph. Electronically Signed   By: Anner Crete M.D.   On: 11/16/2015 23:42        Scheduled Meds: . ARIPiprazole  5 mg Oral BID  . aspirin  81 mg Oral Daily  . atorvastatin  20 mg Oral Daily  . Chlorhexidine Gluconate Cloth  6 each Topical Q0600  . docusate sodium  100 mg Oral BID  . enoxaparin (LOVENOX) injection  40 mg Subcutaneous Q24H  . feeding supplement  1 Container Oral Q24H  . feeding supplement (ENSURE ENLIVE)  237 mL Oral Q24H  . ipratropium-albuterol  3 mL Nebulization Q6H  . methylPREDNISolone (SOLU-MEDROL) injection  80 mg Intravenous  Q8H  . metoprolol tartrate  12.5 mg Oral BID  . mirtazapine  15 mg Oral QHS  . morphine  15 mg Oral BID  . Morphine-Naltrexone  1 tablet Oral BID  . mupirocin ointment  1 application Nasal BID  . nicotine  14 mg Transdermal Daily  . pantoprazole  40 mg Oral QHS  . potassium chloride  60 mEq Oral Q6H  . ticagrelor  90 mg Oral BID   Continuous Infusions:    LOS: 0 days    Time spent: 35 minutes    Viaan Knippenberg A, MD [facility] Pager [phone]  If 7PM-7AM, please contact night-coverage [email or web address] Password [email or web address] 11/17/2015, 1:49 PM

## 2015-11-17 NOTE — Progress Notes (Signed)
Initial Nutrition Assessment  DOCUMENTATION CODES:  Non-severe (moderate) malnutrition in context of social or environmental circumstances   Pt meets criteria for MODERATE MALNUTRITION in the context of Chronic Illness as evidenced by Mild muscle/fat loss.  INTERVENTION:  Decrease Ensure to q 24 hrs  Mighty Shake II TID with Lunch and Dinner, each supplement provides 480-500 kcals and 20-23 grams of protein  Boost Breeze po q 24 hrs, each supplement provides 250 kcal and 9 grams of protein  NUTRITION DIAGNOSIS:  Increased nutrient needs related to catabolic illness (End stage COPD) as evidenced by the estimated nutritional requirements for this condition  GOAL:  Patient will meet greater than or equal to 90% of their needs  MONITOR:  PO intake, Supplement acceptance, Labs, I & O's  REASON FOR ASSESSMENT:  Consult COPD Protocol  ASSESSMENT:  65 y/o male PMHx COPD on 3L at home, HTN, HLD, CAD, PUD, Depression/anxiety, opioid abuse, Dysphagia, ongoing tobacco abuse. Presents with 3 days SOB. Admitted for COPD exacerbation  Pt reports that he has lost his appetite for only 1 day. Prior to that he says he was eating well and believes he has gained weight over the past few weeks.   He reports that the Remeron he is on greatly helps his appetite. Denies any C/D/V. He does have nausea that he rates as 5/10.   He reports his UBW is 190 lbs, however there is no documentation to show he has weighed this much in the past 5 yeas. pER CHART REVIEW, HE MAY BE DOWN ~5 Lbs in the last 3 months. He had a severe illness this past spring which he has rebounded from.   He drinks Ensure, but he does not like it. RD offered alternatives to which he was accepting. Declined any food preferences.   NFPE: Physical exam Reveals mild muscle and fat loss  Medications: Colace, Ensure Enlive TID, Methylprednisone, Remeron, zofran, KCL, PPI Labs:K: 3.2, BG:188   Recent Labs Lab 11/16/15 2309  11/17/15 0501  NA 135 136  K 3.2* 3.2*  CL 98* 99*  CO2 28 28  BUN 15 16  CREATININE 0.75 0.71  CALCIUM 8.9 8.8*  GLUCOSE 109* 188*   Diet Order:  Diet Heart Room service appropriate? Yes; Fluid consistency: Thin  Skin:  Reviewed, no issues  Last BM:  10/26  Height:  Ht Readings from Last 1 Encounters:  11/17/15 5' 10.5" (1.791 m)   Weight:  Wt Readings from Last 1 Encounters:  11/17/15 142 lb 10.2 oz (64.7 kg)   Wt Readings from Last 10 Encounters:  11/17/15 142 lb 10.2 oz (64.7 kg)  08/03/15 150 lb (68 kg)  07/20/15 147 lb (66.7 kg)  05/15/15 115 lb 1.3 oz (52.2 kg)  04/09/15 128 lb 15.5 oz (58.5 kg)  03/24/15 126 lb 12.2 oz (57.5 kg)  03/15/15 136 lb 3.9 oz (61.8 kg)  12/04/14 155 lb (70.3 kg)  12/04/14 155 lb (70.3 kg)  11/14/14 150 lb 3.2 oz (68.1 kg)   Ideal Body Weight:  76.82 kg  BMI:  Body mass index is 20.18 kg/m.  Estimated Nutritional Needs:  Kcal:  2050-2250 (32-35 kcal/kg bw) Protein:  91-104 g (1.4-1.6 g/kg bw) Fluid:  >2.2 liters  EDUCATION NEEDS:  No education needs identified at this time  Burtis Junes RD, LDN, [place] [facility] Pager: [phone] 11/17/2015 11:04 AM

## 2015-11-17 NOTE — Progress Notes (Signed)
OT Cancellation Note  Patient Details Name: [name] MRN: [id number] DOB: 12-Dec-1950   Cancelled Treatment:     Reason evaluation not completed: Chart reviewed, pt screened. Pt is at baseline independence with ADL and functional mobility tasks. No further OT services required at this time.   Guadelupe Sabin, OTR/L  [phone] 11/17/2015, 3:13 PM

## 2015-11-17 NOTE — H&P (Addendum)
History and Physical    [name] [id number] DOB: 1950-02-19 DOA: 11/16/2015  PCP: [name], MD Consultants:  Etter Sjogren - cardiology; Edison Pace - pain management Patient coming from: home - lives with wife  Chief Complaint: SOB  HPI: [name] is a 65 y.o. male with medical history significant of chronic respiratory failure from COPD, HTN, HLD, thoracic aortic dissection, and CAD presenting with SOB.  He reports that he "can't breathe."  Bad the last 3 days, got to where he was out of breath with minimal exertion/talking.  Wears 3L home O2 intermittently, but continuous the last 3 days.  Some cough, nonproductive, unchanged from baseline.  No sick contacts.  No fevers.    Initially reported that he quit smoking 3 months ago.  When asked if he needed a patch, he said he did.  Upon further questioning, the patient has not really quit smoking.  ED Course: Per Dr. Tomi Bamberger: Pt was started on a continuous nebulizer and IV steroids.   After reviewing his labs he was given oral potassium.  He walked to the bathroom and became more dyspneic. On lung exam, he has diminished breath sounds now with diffuse wheezing. Retractions are worse. He appears more distressed. We discussed admission and he is agreeable. Pt is at the end of his continuous nebulizer, will add a single and talk to hospitalist about admission.   Review of Systems: As per HPI; otherwise 10 point review of systems reviewed and negative.   Ambulatory Status:  A,bulates indepdently.  Past Medical History:  Diagnosis Date  . Anemia   . Anxiety   . Aortic dissection (HCC)    TYPE 3  . CAD (coronary artery disease)    multiple, most recently Jan-March 2017  . Cardiac tamponade   . Chronic bronchitis ([place])   . Chronic fatigue   . Chronic pain   . Constipation   . COPD (chronic obstructive pulmonary disease) ([place])   . Depression   . Dysphagia, oropharyngeal phase   . ETOH abuse   . GERD (gastroesophageal reflux  disease)   . Hiatal hernia   . HTN (hypertension)   . Hypercholesterolemia   . Metabolic encephalopathy   . Opioid abuse   . Peptic ulcer disease 08/2010   EGD   . PNA (pneumonia)   . Schatzki's ring     Past Surgical History:  Procedure Laterality Date  . BACK SURGERY    . CABG X 6  07/31/2007   [facility]  . CARDIAC CATHETERIZATION    . CARDIAC CATHETERIZATION N/A 03/16/2015   Procedure: Coronary/Graft Angiography;  Surgeon: Sherren Mocha, MD;  Location: [place];  Service: Cardiovascular;  Laterality: N/A;  . CARDIAC CATHETERIZATION N/A 03/16/2015   Procedure: Coronary Stent Intervention;  Surgeon: Sherren Mocha, MD;  Location: [facility];  Service: Cardiovascular;  Laterality: N/A;  . KNEE SURGERY Right    acl  . OLECRANON BURSECTOMY Left 08/27/2012   Procedure: EXCISION LEFT OLECRANON BURSA;  Surgeon: Sanjuana Kava, MD;  Location: [facility];  Service: Orthopedics;  Laterality: Left;  . OLECRANON BURSECTOMY Left 05/25/2013   Procedure:  Excision sinus tract and tissue left elbow ;  Surgeon: Sanjuana Kava, MD;  Location: [facility];  Service: Orthopedics;  Laterality: Left;  . ORIF SCAPULAR FRACTURE Right   . SHOULDER SURGERY Left   . STERNAL WIRE REMOVEAL  12/31/2007   [facility]    Social History   Social History  . Marital status: Married  Spouse name: N/A  . Number of children: N/A  . Years of education: N/A   Occupational History  . Dealer    Social History Main Topics  . Smoking status: Former Smoker    Packs/day: 1.00    Years: 20.00    Types: Cigarettes    Quit date: 07/22/2015  . Smokeless tobacco: Former Systems developer    Quit date: 06/05/2015     Comment: smoked one ppd "on and off" for 20 yrs. quit in May 17  . Alcohol use No  . Drug use: No  . Sexual activity: Not on file   Other Topics Concern  . Not on file   Social History Narrative   ** Merged History Encounter **        Allergies  Allergen Reactions  . Bee Venom Swelling    Neck  swelling, passes out  . Codeine Other (See Comments)    Causes GI Upset    Family History  Problem Relation Age of Onset  . Heart disease Mother   . Heart disease Father     Prior to Admission medications   Medication Sig Start Date End Date Taking? Authorizing Provider  acetaminophen (TYLENOL 8 HOUR ARTHRITIS PAIN) 650 MG CR tablet Take 650 mg by mouth every 4 (four) hours as needed for pain.    Historical Provider, MD  albuterol (PROVENTIL) (2.5 MG/3ML) 0.083% nebulizer solution Take 3 mLs (2.5 mg total) by nebulization every 2 (two) hours as needed for wheezing or shortness of breath. 08/06/15   Erline Hau, MD  ARIPiprazole (ABILIFY) 5 MG tablet Take 1 tablet (5 mg total) by mouth 2 (two) times daily. 05/16/15   Barton Dubois, MD  aspirin 81 MG chewable tablet Chew 1 tablet (81 mg total) by mouth daily. 04/10/15   [name], MD  atorvastatin (LIPITOR) 20 MG tablet Take 20 mg by mouth daily.    Historical Provider, MD  benzonatate (TESSALON) 100 MG capsule Take 1 capsule (100 mg total) by mouth 2 (two) times daily as needed for cough. 08/06/15   Erline Hau, MD  EMBEDA 20-0.8 MG CPCR Take 1 tablet by mouth 2 (two) times daily. 07/26/15   Historical Provider, MD  feeding supplement, ENSURE ENLIVE, (ENSURE ENLIVE) LIQD Take 237 mLs by mouth 3 (three) times daily between meals. 05/16/15   Barton Dubois, MD  Fluticasone-Salmeterol (ADVAIR DISKUS) 250-50 MCG/DOSE AEPB Inhale 1 puff into the lungs 2 (two) times daily. 08/06/15   Erline Hau, MD  metoprolol tartrate (LOPRESSOR) 25 MG tablet Take 0.5 tablets (12.5 mg total) by mouth 2 (two) times daily. 05/16/15   Barton Dubois, MD  mirtazapine (REMERON) 15 MG tablet Take 1 tablet (15 mg total) by mouth at bedtime. 05/16/15   Barton Dubois, MD  morphine (MSIR) 15 MG tablet Take 15 mg by mouth 2 (two) times daily. 07/21/15   Historical Provider, MD  pantoprazole (PROTONIX) 40 MG tablet Take 1 tablet (40 mg total)  by mouth at bedtime. 04/10/15   [name], MD  predniSONE (DELTASONE) 10 MG tablet Take 1 tablet (10 mg total) by mouth daily with breakfast. Take 6 tablets today and then decrease by 1 tablet daily until none are left. 08/06/15   Erline Hau, MD  ticagrelor (BRILINTA) 90 MG TABS tablet Take 1 tablet (90 mg total) by mouth 2 (two) times daily. 03/17/15   Brett Canales, PA-C  tiotropium (SPIRIVA) 18 MCG inhalation capsule Place 1 capsule (18 mcg  total) into inhaler and inhale daily. 08/06/15   Erline Hau, MD    Physical Exam: Vitals:   11/17/15 0000 11/17/15 0043 11/17/15 0130 11/17/15 0200  BP: 162/96  133/89 139/88  Pulse: 89  104 101  Resp: 15  18   Temp:      TempSrc:      SpO2: 100% 100% 94% 95%  Weight:      Height:         General:  Appears far older than stated age, NAD on [place] O2 Eyes:  PERRL, EOMI, normal lids, iris ENT:  grossly normal hearing, lips & tongue, mmm Neck:  no LAD, masses or thyromegaly Cardiovascular:  RRR, no m/r/g. No LE edema.  Respiratory:  Diffuse wheezing, moderate air movement.  Normal respiratory effort at rest. Abdomen:  soft, ntnd, NABS Skin:  no rash or induration seen on limited exam Musculoskeletal:  grossly normal tone BUE/BLE, good ROM, no bony abnormality Psychiatric:  blunted affect, speech fluent and appropriate, AOx3 Neurologic:  CN 2-12 grossly intact, moves all extremities in coordinated fashion, sensation intact  Labs on Admission: I have personally reviewed following labs and imaging studies  CBC:  Recent Labs Lab 11/16/15 2309  WBC 7.3  HGB 12.3*  HCT 36.9*  MCV 93.7  PLT 0000000   Basic Metabolic Panel:  Recent Labs Lab 11/16/15 2309  NA 135  K 3.2*  CL 98*  CO2 28  GLUCOSE 109*  BUN 15  CREATININE 0.75  CALCIUM 8.9   GFR: Estimated Creatinine Clearance: 95.8 mL/min (by C-G formula based on SCr of 0.75 mg/dL). Liver Function Tests: No results for input(s): AST, ALT, ALKPHOS, BILITOT,  PROT, ALBUMIN in the last 168 hours. No results for input(s): LIPASE, AMYLASE in the last 168 hours. No results for input(s): AMMONIA in the last 168 hours. Coagulation Profile: No results for input(s): INR, PROTIME in the last 168 hours. Cardiac Enzymes: No results for input(s): CKTOTAL, CKMB, CKMBINDEX, TROPONINI in the last 168 hours. BNP (last 3 results) No results for input(s): PROBNP in the last 8760 hours. HbA1C: No results for input(s): HGBA1C in the last 72 hours. CBG: No results for input(s): GLUCAP in the last 168 hours. Lipid Profile: No results for input(s): CHOL, HDL, LDLCALC, TRIG, CHOLHDL, LDLDIRECT in the last 72 hours. Thyroid Function Tests: No results for input(s): TSH, T4TOTAL, FREET4, T3FREE, THYROIDAB in the last 72 hours. Anemia Panel: No results for input(s): VITAMINB12, FOLATE, FERRITIN, TIBC, IRON, RETICCTPCT in the last 72 hours. Urine analysis:    Component Value Date/Time   COLORURINE YELLOW 05/11/2015 1612   APPEARANCEUR CLEAR 05/11/2015 1612   LABSPEC 1.013 05/11/2015 1612   PHURINE 5.0 05/11/2015 1612   GLUCOSEU NEGATIVE 05/11/2015 1612   HGBUR NEGATIVE 05/11/2015 1612   BILIRUBINUR NEGATIVE 05/11/2015 1612   KETONESUR NEGATIVE 05/11/2015 1612   PROTEINUR NEGATIVE 05/11/2015 1612   UROBILINOGEN 0.2 08/26/2012 0930   NITRITE NEGATIVE 05/11/2015 1612   LEUKOCYTESUR NEGATIVE 05/11/2015 1612    Creatinine Clearance: Estimated Creatinine Clearance: 95.8 mL/min (by C-G formula based on SCr of 0.75 mg/dL).  Sepsis Labs: @LABRCNTIP (procalcitonin:4,lacticidven:4) )No results found for this or any previous visit (from the past 240 hour(s)).   Radiological Exams on Admission: Dg Chest 2 View  Result Date: 11/16/2015 CLINICAL DATA:  65 year old male with shortness of breath. EXAM: CHEST  2 VIEW COMPARISON:  Chest radiograph dated 08/03/2015 FINDINGS: There is emphysematous changes of the lungs with hyperinflation. No focal consolidation, pleural  effusion, or pneumothorax.  The cardiac silhouette is within normal limits. The aorta is tortuous. CABG clips in noted along the mediastinum similar to prior radiograph. The descending thoracic aorta measures up to 3.7 cm in diameter. The aortic arch measures approximately 6 cm in diameter. These findings are similar to the prior radiograph. No acute osseous pathology. IMPRESSION: No acute cardiopulmonary process. COPD changes. Prominence of the aortic arch and descending thoracic aorta similar to the prior radiograph. Electronically Signed   By: Anner Crete M.D.   On: 11/16/2015 23:42    EKG: Independently reviewed.  NSR with rate 94; nonspecific ST changes with no evidence of acute ischemia and NSCSLT  Assessment/Plan Principal Problem:   Acute on chronic respiratory failure (HCC) Active Problems:   HTN (hypertension)   Hypercholesterolemia   Chronic back pain   Depression   Tobacco abuse   COPD exacerbation (HCC)   Hypokalemia   Hyperglycemia    Acute on chronic respiratory failure resulting from COPD with exacerbation -Patient's shortness of breath is most likely caused by acute COPD exacerbation.  -Chest x-ray is not consistent with pneumonia  -will admit patient to Med Surg bed with continuous pulse ox -Nebulizers: scheduled Duoneb and prn albuterol -Solu-Medrol 80 mg IV BID  -Only 1 of 3 cardinal symptoms and so does not currently require antibiotics.  -Urine drug screen, HIV -Continue home O2 (3L), increase if needed but watch for suppression of respiratory drive -Patient is willing to be intubated if needed -He continues to smoke (see below)  HTN -Continue Lopressor  HLD -Continue Lipitor  Depression -Questionable diagnosis based on chronic meds, although he does have a blunted affect and appears somewhat depressed -Continue Abilify and Remeron for now -Elevated TSH in April and July, will recheck TSH and T4  Tobacco dependence -Encourage cessation.  This was  discussed with the patient and should be reviewed on an ongoing basis.  -Patch ordered at patient request.  Hypokalemia -Repleted in ER. -Will follow.  Hyperglycemia -No recent A1c, will check  Chronic pain -I have reviewed this patient in the [place] Controlled Substances Reporting System.  He is receiving his medications from only one provider and appears to be taking them as prescribed.  Will continue home meds as ordered.   DVT prophylaxis: Lovenox Code Status:  Full - confirmed with patient Family Communication: None present  Disposition Plan:  Home once clinically improved Consults called: None  Admission status: Admit - It is my clinical opinion that admission to INPATIENT is reasonable and necessary because this patient will require at least 2 midnights in the hospital to treat this condition based on the medical complexity of the problems presented.  Given the aforementioned information, the predictability of an adverse outcome is felt to be significant.     Karmen Bongo MD [facility]  If 7PM-7AM, please contact night-coverage [email or web address] Password [email or web address]  11/17/2015, 2:32 AM

## 2015-11-17 NOTE — H&P (Signed)
CSW received COPD gold protocol. Patient has not had three admissions in the past six months. Patient has a diagnosis of anxiety and is taking psychotropic medications.    Reconsult if needed.   Artavia Jeanlouis, Clydene Pugh, LCSW

## 2015-11-17 NOTE — ED Notes (Signed)
Pt nauseated w/ dry heaves. EDP notified.

## 2015-11-17 NOTE — Progress Notes (Signed)
PT Cancellation Note  Patient Details Name: [name] MRN: [id number] DOB: 03-29-1950   Cancelled Treatment:    Reason Eval/Treat Not Completed: PT screened, no needs identified, will sign off (Pt visualized ambulating in the room independently.  Pt expressed that his mobility is at baseline.  Pt has no skilled PT need at this time, will sign off.  )   Beth Desman Polak, PT, DPT X: [phone]

## 2015-11-18 DIAGNOSIS — R739 Hyperglycemia, unspecified: Secondary | ICD-10-CM

## 2015-11-18 DIAGNOSIS — E7800 Pure hypercholesterolemia, unspecified: Secondary | ICD-10-CM

## 2015-11-18 DIAGNOSIS — I10 Essential (primary) hypertension: Secondary | ICD-10-CM

## 2015-11-18 DIAGNOSIS — M549 Dorsalgia, unspecified: Secondary | ICD-10-CM

## 2015-11-18 DIAGNOSIS — F329 Major depressive disorder, single episode, unspecified: Secondary | ICD-10-CM

## 2015-11-18 DIAGNOSIS — E876 Hypokalemia: Secondary | ICD-10-CM

## 2015-11-18 DIAGNOSIS — G8929 Other chronic pain: Secondary | ICD-10-CM

## 2015-11-18 DIAGNOSIS — J441 Chronic obstructive pulmonary disease with (acute) exacerbation: Secondary | ICD-10-CM

## 2015-11-18 LAB — RAPID URINE DRUG SCREEN, HOSP PERFORMED
AMPHETAMINES: NOT DETECTED
BENZODIAZEPINES: NOT DETECTED
Barbiturates: NOT DETECTED
COCAINE: NOT DETECTED
OPIATES: POSITIVE — AB
TETRAHYDROCANNABINOL: NOT DETECTED

## 2015-11-18 LAB — HIV ANTIBODY (ROUTINE TESTING W REFLEX): HIV Screen 4th Generation wRfx: NONREACTIVE

## 2015-11-18 LAB — CBC
HCT: 39.6 % (ref 39.0–52.0)
Hemoglobin: 12.8 g/dL — ABNORMAL LOW (ref 13.0–17.0)
MCH: 30.9 pg (ref 26.0–34.0)
MCHC: 32.3 g/dL (ref 30.0–36.0)
MCV: 95.7 fL (ref 78.0–100.0)
PLATELETS: 218 10*3/uL (ref 150–400)
RBC: 4.14 MIL/uL — ABNORMAL LOW (ref 4.22–5.81)
RDW: 14 % (ref 11.5–15.5)
WBC: 18.3 10*3/uL — ABNORMAL HIGH (ref 4.0–10.5)

## 2015-11-18 LAB — BASIC METABOLIC PANEL
Anion gap: 9 (ref 5–15)
BUN: 26 mg/dL — AB (ref 6–20)
CALCIUM: 9.4 mg/dL (ref 8.9–10.3)
CO2: 29 mmol/L (ref 22–32)
CREATININE: 0.96 mg/dL (ref 0.61–1.24)
Chloride: 96 mmol/L — ABNORMAL LOW (ref 101–111)
GFR calc non Af Amer: >60 mL/min (ref >60)
GLUCOSE: 153 mg/dL — AB (ref 65–99)
Potassium: 4.7 mmol/L (ref 3.5–5.1)
Sodium: 134 mmol/L — ABNORMAL LOW (ref 135–145)

## 2015-11-18 LAB — HEMOGLOBIN A1C
Hgb A1c MFr Bld: 5.3 % (ref 4.8–5.6)
MEAN PLASMA GLUCOSE: 105 mg/dL

## 2015-11-18 MED ORDER — HYDROCOD POLST-CPM POLST ER 10-8 MG/5ML PO SUER
5.0000 mL | Freq: Two times a day (BID) | ORAL | Status: DC
  Administered 2015-11-18 – 2015-11-20 (×5): 5 mL via ORAL
  Filled 2015-11-18 (×5): qty 5

## 2015-11-18 MED ORDER — ALUM & MAG HYDROXIDE-SIMETH 200-200-20 MG/5ML PO SUSP
15.0000 mL | Freq: Four times a day (QID) | ORAL | Status: DC | PRN
  Administered 2015-11-18 – 2015-11-19 (×3): 15 mL via ORAL
  Filled 2015-11-18 (×3): qty 30

## 2015-11-18 MED ORDER — BENZONATATE 100 MG PO CAPS
100.0000 mg | ORAL_CAPSULE | Freq: Three times a day (TID) | ORAL | Status: DC
  Administered 2015-11-18 – 2015-11-20 (×7): 100 mg via ORAL
  Filled 2015-11-18 (×7): qty 1

## 2015-11-18 MED ORDER — GUAIFENESIN ER 600 MG PO TB12
1200.0000 mg | ORAL_TABLET | Freq: Two times a day (BID) | ORAL | Status: DC
  Administered 2015-11-18 – 2015-11-20 (×5): 1200 mg via ORAL
  Filled 2015-11-18 (×5): qty 2

## 2015-11-18 NOTE — Progress Notes (Signed)
PROGRESS NOTE  Jim CROXFORD  [id number] DOB: 1950/03/29 DOA: 11/16/2015 PCP: [name], MD Outpatient Specialists:  Subjective: Still has some cough without significant sputum production, reported epigastric pain associated with tenderness likely secondary to cough.  Brief Narrative:  [name] is a 65 y.o. male with medical history significant of chronic respiratory failure from COPD, HTN, HLD, thoracic aortic dissection, and CAD presenting with SOB.  He reports that he "can't breathe."  Bad the last 3 days, got to where he was out of breath with minimal exertion/talking.  Wears 3L home O2 intermittently, but continuous the last 3 days.  Some cough, nonproductive, unchanged from baseline.  No sick contacts.  No fevers.    Initially reported that he quit smoking 3 months ago.  When asked if he needed a patch, he said he did.  Upon further questioning, the patient has not really quit smoking.  Assessment & Plan:   Principal Problem:   Acute on chronic respiratory failure (HCC) Active Problems:   HTN (hypertension)   Hypercholesterolemia   Chronic back pain   Depression   Tobacco abuse   COPD exacerbation (HCC)   Hypokalemia   Hyperglycemia   Acute on chronic respiratory failure resulting from COPD with exacerbation -Patient's shortness of breath is most likely caused by acute COPD exacerbation.  -Chest x-ray is not consistent with pneumonia  -He is on 3 L of oxygen at home, has chronic hypoxic respiratory failure secondary to COPD. -Started on IV antibiotics and Solu-Medrol. -Continue supportive management with bronchodilators, mucolytics, antitussives and oxygen as needed. -Change antitussives to Tussionex.  HTN -Continue Lopressor  HLD -Continue Lipitor  Depression -Questionable diagnosis based on chronic meds, although he does have a blunted affect and appears somewhat depressed -Continue Abilify and Remeron for now -Elevated TSH in April and July, will  recheck TSH and T4  Tobacco dependence -Encourage cessation.  This was discussed with the patient and should be reviewed on an ongoing basis.  -Patch ordered at patient request.  Hypokalemia -Repleted with oral supplements, BMP is not back from this morning.  Hyperglycemia -No recent A1c, will check  Chronic pain -Admitting physician reviewed this patient in the [place] Controlled Substances Reporting System.   -He is receiving his medications from only one provider and appears to be taking them as prescribed.  Will continue home meds as ordered.   DVT prophylaxis: Lovenox Code Status: Full Code Family Communication:  Disposition Plan:  Diet: Diet Heart Room service appropriate? Yes; Fluid consistency: Thin  Consultants:   None  Procedures:   None  Antimicrobials:   None   Objective: Vitals:   11/17/15 1446 11/17/15 2042 11/17/15 2206 11/18/15 0814  BP:   131/74   Pulse:   87   Resp:   20   Temp:   98.3 F (36.8 C)   TempSrc:   Oral   SpO2: 98% 98% 97% 96%  Weight:      Height:        Intake/Output Summary (Last 24 hours) at 11/18/15 1103 Last data filed at 11/18/15 0607  Gross per 24 hour  Intake                0 ml  Output              350 ml  Net             -350 ml   Filed Weights   11/16/15 2249 11/17/15 0300 11/17/15 0400  Weight: 72.6  kg (160 lb) 69.7 kg (153 lb 10.6 oz) 64.7 kg (142 lb 10.2 oz)    Examination: General exam: Appears calm and comfortable  Respiratory system: Clear to auscultation. Respiratory effort normal. Cardiovascular system: S1 & S2 heard, RRR. No JVD, murmurs, rubs, gallops or clicks. No pedal edema. Gastrointestinal system: Abdomen is nondistended, soft and nontender. No organomegaly or masses felt. Normal bowel sounds heard. Central nervous system: Alert and oriented. No focal neurological deficits. Extremities: Symmetric 5 x 5 power. Skin: No rashes, lesions or ulcers Psychiatry: Judgement and insight appear normal.  Mood & affect appropriate.   Data Reviewed: I have personally reviewed following labs and imaging studies  CBC:  Recent Labs Lab 11/16/15 2309 11/17/15 0501  WBC 7.3 6.2  HGB 12.3* 11.7*  HCT 36.9* 35.7*  MCV 93.7 93.7  PLT 179 0000000   Basic Metabolic Panel:  Recent Labs Lab 11/16/15 2309 11/17/15 0501  NA 135 136  K 3.2* 3.2*  CL 98* 99*  CO2 28 28  GLUCOSE 109* 188*  BUN 15 16  CREATININE 0.75 0.71  CALCIUM 8.9 8.8*   GFR: Estimated Creatinine Clearance: 85.4 mL/min (by C-G formula based on SCr of 0.71 mg/dL). Liver Function Tests: No results for input(s): AST, ALT, ALKPHOS, BILITOT, PROT, ALBUMIN in the last 168 hours. No results for input(s): LIPASE, AMYLASE in the last 168 hours. No results for input(s): AMMONIA in the last 168 hours. Coagulation Profile: No results for input(s): INR, PROTIME in the last 168 hours. Cardiac Enzymes: No results for input(s): CKTOTAL, CKMB, CKMBINDEX, TROPONINI in the last 168 hours. BNP (last 3 results) No results for input(s): PROBNP in the last 8760 hours. HbA1C:  Recent Labs  11/16/15 2309  HGBA1C 5.3   CBG: No results for input(s): GLUCAP in the last 168 hours. Lipid Profile: No results for input(s): CHOL, HDL, LDLCALC, TRIG, CHOLHDL, LDLDIRECT in the last 72 hours. Thyroid Function Tests:  Recent Labs  11/16/15 0239 11/16/15 2309  TSH 5.291*  --   FREET4  --  0.68   Anemia Panel: No results for input(s): VITAMINB12, FOLATE, FERRITIN, TIBC, IRON, RETICCTPCT in the last 72 hours. Urine analysis:    Component Value Date/Time   COLORURINE YELLOW 05/11/2015 1612   APPEARANCEUR CLEAR 05/11/2015 1612   LABSPEC 1.013 05/11/2015 1612   PHURINE 5.0 05/11/2015 1612   GLUCOSEU NEGATIVE 05/11/2015 1612   HGBUR NEGATIVE 05/11/2015 1612   BILIRUBINUR NEGATIVE 05/11/2015 1612   KETONESUR NEGATIVE 05/11/2015 1612   PROTEINUR NEGATIVE 05/11/2015 1612   UROBILINOGEN 0.2 08/26/2012 0930   NITRITE NEGATIVE 05/11/2015  1612   LEUKOCYTESUR NEGATIVE 05/11/2015 1612   Sepsis Labs: @LABRCNTIP (procalcitonin:4,lacticidven:4)  ) Recent Results (from the past 240 hour(s))  MRSA PCR Screening     Status: Abnormal   Collection Time: 11/17/15  3:28 AM  Result Value Ref Range Status   MRSA by PCR POSITIVE (A) NEGATIVE Final    Comment: RESULT CALLED TO, READ BACK BY AND VERIFIED WITH: SPANGLER, E AT 0900 BY HFLYNT 11/17/15        The GeneXpert MRSA Assay (FDA approved for NASAL specimens only), is one component of a comprehensive MRSA colonization surveillance program. It is not intended to diagnose MRSA infection nor to guide or monitor treatment for MRSA infections.      Invalid input(s): PROCALCITONIN, LACTICACIDVEN   Radiology Studies: Dg Chest 2 View  Result Date: 11/16/2015 CLINICAL DATA:  65 year old male with shortness of breath. EXAM: CHEST  2 VIEW COMPARISON:  Chest radiograph  dated 08/03/2015 FINDINGS: There is emphysematous changes of the lungs with hyperinflation. No focal consolidation, pleural effusion, or pneumothorax. The cardiac silhouette is within normal limits. The aorta is tortuous. CABG clips in noted along the mediastinum similar to prior radiograph. The descending thoracic aorta measures up to 3.7 cm in diameter. The aortic arch measures approximately 6 cm in diameter. These findings are similar to the prior radiograph. No acute osseous pathology. IMPRESSION: No acute cardiopulmonary process. COPD changes. Prominence of the aortic arch and descending thoracic aorta similar to the prior radiograph. Electronically Signed   By: Anner Crete M.D.   On: 11/16/2015 23:42        Scheduled Meds: . ARIPiprazole  5 mg Oral BID  . aspirin  81 mg Oral Daily  . atorvastatin  20 mg Oral Daily  . Chlorhexidine Gluconate Cloth  6 each Topical Q0600  . docusate sodium  100 mg Oral BID  . enoxaparin (LOVENOX) injection  40 mg Subcutaneous Q24H  . feeding supplement  1 Container Oral  Q24H  . feeding supplement (ENSURE ENLIVE)  237 mL Oral Q24H  . ipratropium-albuterol  3 mL Nebulization TID  . methylPREDNISolone (SOLU-MEDROL) injection  80 mg Intravenous Q8H  . metoprolol tartrate  12.5 mg Oral BID  . mirtazapine  15 mg Oral QHS  . morphine  15 mg Oral BID  . mupirocin ointment  1 application Nasal BID  . nicotine  14 mg Transdermal Daily  . pantoprazole  40 mg Oral QHS  . ticagrelor  90 mg Oral BID  . traZODone  50 mg Oral QHS   Continuous Infusions:    LOS: 1 day    Time spent: 35 minutes    Seona Clemenson A, MD [facility] Pager [phone]  If 7PM-7AM, please contact night-coverage [email or web address] Password [email or web address] 11/18/2015, 11:03 AM

## 2015-11-19 LAB — BASIC METABOLIC PANEL
Anion gap: 8 (ref 5–15)
BUN: 29 mg/dL — ABNORMAL HIGH (ref 6–20)
CHLORIDE: 95 mmol/L — AB (ref 101–111)
CO2: 29 mmol/L (ref 22–32)
CREATININE: 0.82 mg/dL (ref 0.61–1.24)
Calcium: 8.7 mg/dL — ABNORMAL LOW (ref 8.9–10.3)
GFR calc non Af Amer: >60 mL/min (ref >60)
Glucose, Bld: 141 mg/dL — ABNORMAL HIGH (ref 65–99)
POTASSIUM: 4.8 mmol/L (ref 3.5–5.1)
SODIUM: 132 mmol/L — AB (ref 135–145)

## 2015-11-19 MED ORDER — MAGNESIUM SULFATE 2 GM/50ML IV SOLN
2.0000 g | Freq: Once | INTRAVENOUS | Status: AC
  Administered 2015-11-19: 2 g via INTRAVENOUS
  Filled 2015-11-19: qty 50

## 2015-11-19 MED ORDER — ALUM & MAG HYDROXIDE-SIMETH 200-200-20 MG/5ML PO SUSP
15.0000 mL | Freq: Three times a day (TID) | ORAL | Status: DC
  Administered 2015-11-19 – 2015-11-20 (×3): 15 mL via ORAL
  Filled 2015-11-19 (×3): qty 30

## 2015-11-19 MED ORDER — CLONAZEPAM 0.5 MG PO TABS
0.2500 mg | ORAL_TABLET | Freq: Every day | ORAL | Status: DC
  Administered 2015-11-19 – 2015-11-20 (×2): 0.25 mg via ORAL
  Filled 2015-11-19 (×2): qty 1

## 2015-11-19 MED ORDER — FUROSEMIDE 10 MG/ML IJ SOLN
40.0000 mg | Freq: Once | INTRAMUSCULAR | Status: AC
  Administered 2015-11-19: 40 mg via INTRAVENOUS
  Filled 2015-11-19: qty 4

## 2015-11-19 MED ORDER — OXYCODONE-ACETAMINOPHEN 5-325 MG PO TABS
2.0000 | ORAL_TABLET | ORAL | Status: DC | PRN
  Administered 2015-11-19 – 2015-11-20 (×5): 2 via ORAL
  Filled 2015-11-19 (×5): qty 2

## 2015-11-19 MED ORDER — POTASSIUM CHLORIDE CRYS ER 20 MEQ PO TBCR
20.0000 meq | EXTENDED_RELEASE_TABLET | Freq: Once | ORAL | Status: AC
  Administered 2015-11-19: 20 meq via ORAL
  Filled 2015-11-19: qty 1

## 2015-11-19 NOTE — Progress Notes (Signed)
PROGRESS NOTE  [name]  [id number] DOB: [birth year] DOA: 11/16/2015 PCP: [name], MD Outpatient Specialists:  Subjective: Stating he is feeling better he still has some pain when he coughs especially around rectus abdominis insertion in the ribs.  Brief Narrative:  [name] is a 65 y.o. male with medical history significant of chronic respiratory failure from COPD, HTN, HLD, thoracic aortic dissection, and CAD presenting with SOB.  He reports that he "can't breathe."  Bad the last 3 days, got to where he was out of breath with minimal exertion/talking.  Wears 3L home O2 intermittently, but continuous the last 3 days.  Some cough, nonproductive, unchanged from baseline.  No sick contacts.  No fevers.    Initially reported that he quit smoking 3 months ago.  When asked if he needed a patch, he said he did.  Upon further questioning, the patient has not really quit smoking.  Assessment & Plan:   Principal Problem:   Acute on chronic respiratory failure (HCC) Active Problems:   HTN (hypertension)   Hypercholesterolemia   Chronic back pain   Depression   Tobacco abuse   COPD exacerbation (HCC)   Hypokalemia   Hyperglycemia   Acute on chronic respiratory failure resulting from COPD with exacerbation -Patient's shortness of breath is most likely caused by acute COPD exacerbation.  -Chest x-ray is not consistent with pneumonia  -He is on 3 L of oxygen at home, has chronic hypoxic respiratory failure secondary to COPD. -Started on IV antibiotics and Solu-Medrol. -Continue supportive management with bronchodilators, mucolytics, antitussives and oxygen as needed. -Feels much better after started on the Tussionex, and incentive spirometry  HTN -Continue Lopressor  HLD -Continue Lipitor  Depression -Questionable diagnosis based on chronic meds, although he does have a blunted affect and appears somewhat depressed -Continue Abilify and Remeron for now -Elevated  TSH in April and July, will recheck TSH and T4  Tobacco dependence -Encourage cessation.  This was discussed with the patient and should be reviewed on an ongoing basis.  -Patch ordered at patient request. -Asked for something for anxiety.  Hypokalemia -Repleted with oral supplements, BMP is not back from this morning. -Has history of hypomagnesemia we'll replete magnesium and check in a.m.  Hyperglycemia -No recent A1c, will check  Chronic pain -Admitting physician reviewed this patient in the [place] Controlled Substances Reporting System.   -He is receiving his medications from only one provider and appears to be taking them as prescribed.  Will continue home meds as ordered. -Reported new acute pain in his abdomen has fourth Percocet to be increased.   DVT prophylaxis: Lovenox Code Status: Full Code Family Communication:  Disposition Plan:  Diet: Diet Heart Room service appropriate? Yes; Fluid consistency: Thin  Consultants:   None  Procedures:   None  Antimicrobials:   None   Objective: Vitals:   11/18/15 1933 11/18/15 2213 11/19/15 0543 11/19/15 0756  BP:  138/78 (!) 145/82   Pulse: 77 75 74   Resp: 18 20 (!) 21   Temp:  98.2 F (36.8 C) 98.2 F (36.8 C)   TempSrc:  Oral Oral   SpO2: 96% 96% 97% 97%  Weight:      Height:        Intake/Output Summary (Last 24 hours) at 11/19/15 1056 Last data filed at 11/18/15 1200  Gross per 24 hour  Intake              240 ml  Output  0 ml  Net              240 ml   Filed Weights   11/16/15 2249 11/17/15 0300 11/17/15 0400  Weight: 72.6 kg (160 lb) 69.7 kg (153 lb 10.6 oz) 64.7 kg (142 lb 10.2 oz)    Examination: General exam: Appears calm and comfortable  Respiratory system: Clear to auscultation. Respiratory effort normal. Cardiovascular system: S1 & S2 heard, RRR. No JVD, murmurs, rubs, gallops or clicks. No pedal edema. Gastrointestinal system: Abdomen is nondistended, soft and nontender. No  organomegaly or masses felt. Normal bowel sounds heard. Central nervous system: Alert and oriented. No focal neurological deficits. Extremities: Symmetric 5 x 5 power. Skin: No rashes, lesions or ulcers Psychiatry: Judgement and insight appear normal. Mood & affect appropriate.   Data Reviewed: I have personally reviewed following labs and imaging studies  CBC:  Recent Labs Lab 11/16/15 2309 11/17/15 0501 11/18/15 0621  WBC 7.3 6.2 18.3*  HGB 12.3* 11.7* 12.8*  HCT 36.9* 35.7* 39.6  MCV 93.7 93.7 95.7  PLT 179 181 [id number]   Basic Metabolic Panel:  Recent Labs Lab 11/16/15 2309 11/17/15 0501 11/18/15 0621 11/19/15 0619  NA 135 136 134* 132*  K 3.2* 3.2* 4.7 4.8  CL 98* 99* 96* 95*  CO2 28 28 29 29   GLUCOSE 109* 188* 153* 141*  BUN 15 16 26* 29*  CREATININE 0.75 0.71 0.96 0.82  CALCIUM 8.9 8.8* 9.4 8.7*   GFR: Estimated Creatinine Clearance: 83.3 mL/min (by C-G formula based on SCr of 0.82 mg/dL). Liver Function Tests: No results for input(s): AST, ALT, ALKPHOS, BILITOT, PROT, ALBUMIN in the last 168 hours. No results for input(s): LIPASE, AMYLASE in the last 168 hours. No results for input(s): AMMONIA in the last 168 hours. Coagulation Profile: No results for input(s): INR, PROTIME in the last 168 hours. Cardiac Enzymes: No results for input(s): CKTOTAL, CKMB, CKMBINDEX, TROPONINI in the last 168 hours. BNP (last 3 results) No results for input(s): PROBNP in the last 8760 hours. HbA1C:  Recent Labs  11/16/15 2309  HGBA1C 5.3   CBG: No results for input(s): GLUCAP in the last 168 hours. Lipid Profile: No results for input(s): CHOL, HDL, LDLCALC, TRIG, CHOLHDL, LDLDIRECT in the last 72 hours. Thyroid Function Tests:  Recent Labs  11/16/15 2309  FREET4 0.68   Anemia Panel: No results for input(s): VITAMINB12, FOLATE, FERRITIN, TIBC, IRON, RETICCTPCT in the last 72 hours. Urine analysis:    Component Value Date/Time   COLORURINE YELLOW 05/11/2015 1612     APPEARANCEUR CLEAR 05/11/2015 1612   LABSPEC 1.013 05/11/2015 1612   PHURINE 5.0 05/11/2015 1612   GLUCOSEU NEGATIVE 05/11/2015 1612   HGBUR NEGATIVE 05/11/2015 1612   BILIRUBINUR NEGATIVE 05/11/2015 1612   KETONESUR NEGATIVE 05/11/2015 1612   PROTEINUR NEGATIVE 05/11/2015 1612   UROBILINOGEN 0.2 08/26/2012 0930   NITRITE NEGATIVE 05/11/2015 1612   LEUKOCYTESUR NEGATIVE 05/11/2015 1612   Sepsis Labs: @LABRCNTIP (procalcitonin:4,lacticidven:4)  ) Recent Results (from the past 240 hour(s))  MRSA PCR Screening     Status: Abnormal   Collection Time: 11/17/15  3:28 AM  Result Value Ref Range Status   MRSA by PCR POSITIVE (A) NEGATIVE Final    Comment: RESULT CALLED TO, READ BACK BY AND VERIFIED WITH: SPANGLER, E AT 0900 BY HFLYNT 11/17/15        The GeneXpert MRSA Assay (FDA approved for NASAL specimens only), is one component of a comprehensive MRSA colonization surveillance program. It is not intended to  diagnose MRSA infection nor to guide or monitor treatment for MRSA infections.      Invalid input(s): PROCALCITONIN, [place]   Radiology Studies: No results found.      Scheduled Meds: . ARIPiprazole  5 mg Oral BID  . aspirin  81 mg Oral Daily  . atorvastatin  20 mg Oral Daily  . benzonatate  100 mg Oral TID  . Chlorhexidine Gluconate Cloth  6 each Topical Q0600  . chlorpheniramine-HYDROcodone  5 mL Oral Q12H  . docusate sodium  100 mg Oral BID  . enoxaparin (LOVENOX) injection  40 mg Subcutaneous Q24H  . feeding supplement  1 Container Oral Q24H  . feeding supplement (ENSURE ENLIVE)  237 mL Oral Q24H  . guaiFENesin  1,200 mg Oral BID  . ipratropium-albuterol  3 mL Nebulization TID  . methylPREDNISolone (SOLU-MEDROL) injection  80 mg Intravenous Q8H  . metoprolol tartrate  12.5 mg Oral BID  . mirtazapine  15 mg Oral QHS  . morphine  15 mg Oral BID  . mupirocin ointment  1 application Nasal BID  . nicotine  14 mg Transdermal Daily  . pantoprazole   40 mg Oral QHS  . ticagrelor  90 mg Oral BID  . traZODone  50 mg Oral QHS   Continuous Infusions:    LOS: 2 days    Time spent: 35 minutes    Shawneequa Baldridge A, MD [facility] Pager [phone]  If 7PM-7AM, please contact night-coverage [email or web address] Password [email or web address] 11/19/2015, 10:56 AM

## 2015-11-20 DIAGNOSIS — Z720 Tobacco use: Secondary | ICD-10-CM

## 2015-11-20 LAB — BASIC METABOLIC PANEL
Anion gap: 9 (ref 5–15)
BUN: 41 mg/dL — AB (ref 6–20)
CALCIUM: 8.6 mg/dL — AB (ref 8.9–10.3)
CO2: 30 mmol/L (ref 22–32)
CREATININE: 1.13 mg/dL (ref 0.61–1.24)
Chloride: 91 mmol/L — ABNORMAL LOW (ref 101–111)
GFR calc non Af Amer: >60 mL/min (ref >60)
GLUCOSE: 142 mg/dL — AB (ref 65–99)
Potassium: 4.7 mmol/L (ref 3.5–5.1)
Sodium: 130 mmol/L — ABNORMAL LOW (ref 135–145)

## 2015-11-20 LAB — MAGNESIUM: Magnesium: 2.5 mg/dL — ABNORMAL HIGH (ref 1.7–2.4)

## 2015-11-20 MED ORDER — ATORVASTATIN CALCIUM 20 MG PO TABS: 20.0000 mg | ORAL_TABLET | Freq: Every day | ORAL | Status: DC

## 2015-11-20 MED ORDER — GUAIFENESIN ER 600 MG PO TB12: 1200.0000 mg | ORAL_TABLET | Freq: Two times a day (BID) | ORAL | 0 refills | Status: DC

## 2015-11-20 MED ORDER — PREDNISONE 10 MG PO TABS: ORAL_TABLET | ORAL | 0 refills | Status: DC

## 2015-11-20 MED ORDER — LEVOFLOXACIN 750 MG PO TABS: 750.0000 mg | ORAL_TABLET | Freq: Every day | ORAL | 0 refills | Status: DC

## 2015-11-20 NOTE — Discharge Summary (Addendum)
Physician Discharge Summary  [name] [id number] DOB: 30-Mar-1950 DOA: 11/16/2015  PCP: [name], MD  Admit date: 11/16/2015 Discharge date: 11/20/2015  Admitted From: Home Disposition: Home  Recommendations for Outpatient Follow-up:  1. Follow up with PCP in 1-2 weeks 2. Please obtain BMP/CBC in one week 3. Follow-up for the need of Synthroid with PCP, unclear why it was discontinued.  Home Health: NA Equipment/Devices:NA  Discharge Condition: Stable CODE STATUS: Full Code Diet recommendation: Diet Heart Room service appropriate? Yes; Fluid consistency: Thin  Brief/Interim Summary: [name] a 65 y.o.malewith medical history significant of chronic respiratory failure from COPD, HTN, HLD, thoracic aortic dissection, and CAD presenting with SOB. He reports that he "can't breathe." Bad the last 3 days, got to where he was out of breath with minimal exertion/talking. Wears 3L home O2 intermittently, but continuous the last 3 days. Some cough, nonproductive, unchanged from baseline. No sick contacts. No fevers.   Initially reported that he quit smoking 3 months ago. When asked if he needed a patch, he said he did. Upon further questioning, the patient has not really quit smoking.  Discharge Diagnoses:  Principal Problem:   Acute on chronic respiratory failure (HCC) Active Problems:   HTN (hypertension)   Hypercholesterolemia   Chronic back pain   Depression   Tobacco abuse   COPD exacerbation (HCC)   Hypokalemia   Hyperglycemia   Acute on chronic respiratory failure resulting from COPD with exacerbation -Patient's shortness of breath is most likely caused by acute COPD exacerbation.  -Chest x-ray is not consistent with pneumonia  -He is on 3 L of oxygen at home, has chronic hypoxic respiratory failure secondary to COPD. -Started Initially on IV antibiotics and Solu-Medrol. -Continue supportive management with bronchodilators, mucolytics,  antitussives and oxygen as needed. -Discharged home on levofloxacin for 5 more days, prednisone taper and Mucinex  HTN -Continue Lopressor  HLD -Continue Lipitor  Depression -Questionable diagnosis based on chronic meds, although he does have a blunted affect and appears somewhat depressed -Continue Abilify and Remeron for now -Per patient Abilify discontinued.  Tobacco dependence -Encourage cessation. This was discussed with the patient and should be reviewed on an ongoing basis.  -Patch ordered at patient request.  Hypokalemia -Repleted with oral supplements, BMP is not back from this morning. -Has history of hypomagnesemia we'll replete magnesium and check in a.m.  Hyperglycemia -No recent A1c, will check  Chronic pain -Admitting physician reviewed this patient in the [place] Controlled Substances Reporting System.  -He is receiving his medications from only one provider and appears to be taking them as prescribed. Will continue home meds as ordered.  Hypothyroidism -Patient TSH was 15.193 in April 2017, he was on Synthroid back then. -Currently his TSH is 5.291, not on Synthroid, patient is not sure about medications, follow-up with Dr. Wynetta [name] for further evaluation.  Moderate (Non-severe) malnutrition -Per RD note Non-severe (moderate) malnutrition in context of social or environmental circumstances. -Nutritional supplements   Discharge Instructions     Medication List    TAKE these medications   albuterol (2.5 MG/3ML) 0.083% nebulizer solution Commonly known as:  PROVENTIL Take 3 mLs (2.5 mg total) by nebulization every 2 (two) hours as needed for wheezing or shortness of breath.   aspirin 81 MG chewable tablet Chew 1 tablet (81 mg total) by mouth daily.   atorvastatin 20 MG tablet Commonly known as:  LIPITOR Take 1 tablet (20 mg total) by mouth daily.   benzonatate 100 MG capsule Commonly known  as:  TESSALON Take 1 capsule (100 mg total) by mouth  2 (two) times daily as needed for cough.   cyclobenzaprine 10 MG tablet Commonly known as:  FLEXERIL 1 tablet every 8 (eight) hours.   feeding supplement (ENSURE ENLIVE) Liqd Take 237 mLs by mouth 3 (three) times daily between meals.   Fluticasone-Salmeterol 250-50 MCG/DOSE Aepb Commonly known as:  ADVAIR DISKUS Inhale 1 puff into the lungs 2 (two) times daily.   guaiFENesin 600 MG 12 hr tablet Commonly known as:  MUCINEX Take 2 tablets (1,200 mg total) by mouth 2 (two) times daily.   levofloxacin 750 MG tablet Commonly known as:  LEVAQUIN Take 1 tablet (750 mg total) by mouth daily.   metoprolol tartrate 25 MG tablet Commonly known as:  LOPRESSOR Take 0.5 tablets (12.5 mg total) by mouth 2 (two) times daily.   mirtazapine 15 MG tablet Commonly known as:  REMERON Take 1 tablet (15 mg total) by mouth at bedtime.   morphine 100 MG 12 hr tablet Commonly known as:  MS CONTIN 1 tablet 2 (two) times daily.   oxyCODONE-acetaminophen 5-325 MG tablet Commonly known as:  PERCOCET/ROXICET Take 1 tablet by mouth every 4 (four) hours as needed for severe pain.   pantoprazole 40 MG tablet Commonly known as:  PROTONIX Take 1 tablet (40 mg total) by mouth at bedtime.   predniSONE 10 MG tablet Commonly known as:  DELTASONE Take 6-5-4-3-2-1 PO daily till gone   tiotropium 18 MCG inhalation capsule Commonly known as:  SPIRIVA Place 1 capsule (18 mcg total) into inhaler and inhale daily.   TYLENOL 8 HOUR ARTHRITIS PAIN 650 MG CR tablet Generic drug:  acetaminophen Take 650 mg by mouth every 4 (four) hours as needed for pain.       Allergies  Allergen Reactions  . Bee Venom Swelling    Neck swelling, passes out  . Codeine Other (See Comments)    Causes GI Upset    Consultations:  None   Procedures (Echo, Carotid, EGD, Colonoscopy, ERCP)   Radiological studies: Dg Chest 2 View  Result Date: 11/16/2015 CLINICAL DATA:  65 year old male with shortness of breath.  EXAM: CHEST  2 VIEW COMPARISON:  Chest radiograph dated 08/03/2015 FINDINGS: There is emphysematous changes of the lungs with hyperinflation. No focal consolidation, pleural effusion, or pneumothorax. The cardiac silhouette is within normal limits. The aorta is tortuous. CABG clips in noted along the mediastinum similar to prior radiograph. The descending thoracic aorta measures up to 3.7 cm in diameter. The aortic arch measures approximately 6 cm in diameter. These findings are similar to the prior radiograph. No acute osseous pathology. IMPRESSION: No acute cardiopulmonary process. COPD changes. Prominence of the aortic arch and descending thoracic aorta similar to the prior radiograph. Electronically Signed   By: Anner Crete M.D.   On: 11/16/2015 23:42    Subjective:  Discharge Exam: Vitals:   11/19/15 2045 11/19/15 2055 11/20/15 0532 11/20/15 0728  BP:  108/65 102/61   Pulse:  71 63   Resp:  18 18   Temp:  98 F (36.7 C) 98.7 F (37.1 C)   TempSrc:  Oral Oral   SpO2: 96% 98% 99% 94%  Weight:      Height:       General: Pt is alert, awake, not in acute distress Cardiovascular: RRR, S1/S2 +, no rubs, no gallops Respiratory: CTA bilaterally, no wheezing, no rhonchi Abdominal: Soft, NT, ND, bowel sounds + Extremities: no edema, no cyanosis  The results of significant diagnostics from this hospitalization (including imaging, microbiology, ancillary and laboratory) are listed below for reference.    Microbiology: Recent Results (from the past 240 hour(s))  MRSA PCR Screening     Status: Abnormal   Collection Time: 11/17/15  3:28 AM  Result Value Ref Range Status   MRSA by PCR POSITIVE (A) NEGATIVE Final    Comment: RESULT CALLED TO, READ BACK BY AND VERIFIED WITH: SPANGLER, E AT 0900 BY HFLYNT 11/17/15        The GeneXpert MRSA Assay (FDA approved for NASAL specimens only), is one component of a comprehensive MRSA colonization surveillance program. It is not intended to  diagnose MRSA infection nor to guide or monitor treatment for MRSA infections.      Labs: BNP (last 3 results) No results for input(s): BNP in the last 8760 hours. Basic Metabolic Panel:  Recent Labs Lab 11/16/15 2309 11/17/15 0501 11/18/15 0621 11/19/15 0619 11/20/15 0619  NA 135 136 134* 132* 130*  K 3.2* 3.2* 4.7 4.8 4.7  CL 98* 99* 96* 95* 91*  CO2 28 28 29 29 30   GLUCOSE 109* 188* 153* 141* 142*  BUN 15 16 26* 29* 41*  CREATININE 0.75 0.71 0.96 0.82 1.13  CALCIUM 8.9 8.8* 9.4 8.7* 8.6*  MG  --   --   --   --  2.5*   Liver Function Tests: No results for input(s): AST, ALT, ALKPHOS, BILITOT, PROT, ALBUMIN in the last 168 hours. No results for input(s): LIPASE, AMYLASE in the last 168 hours. No results for input(s): AMMONIA in the last 168 hours. CBC:  Recent Labs Lab 11/16/15 2309 11/17/15 0501 11/18/15 0621  WBC 7.3 6.2 18.3*  HGB 12.3* 11.7* 12.8*  HCT 36.9* 35.7* 39.6  MCV 93.7 93.7 95.7  PLT 179 181 218   Cardiac Enzymes: No results for input(s): CKTOTAL, CKMB, CKMBINDEX, TROPONINI in the last 168 hours. BNP: Invalid input(s): POCBNP CBG: No results for input(s): GLUCAP in the last 168 hours. D-Dimer No results for input(s): DDIMER in the last 72 hours. Hgb A1c No results for input(s): HGBA1C in the last 72 hours. Lipid Profile No results for input(s): CHOL, HDL, LDLCALC, TRIG, CHOLHDL, LDLDIRECT in the last 72 hours. Thyroid function studies No results for input(s): TSH, T4TOTAL, T3FREE, THYROIDAB in the last 72 hours.  Invalid input(s): FREET3 Anemia work up No results for input(s): VITAMINB12, FOLATE, FERRITIN, TIBC, IRON, RETICCTPCT in the last 72 hours. Urinalysis    Component Value Date/Time   COLORURINE YELLOW 05/11/2015 1612   APPEARANCEUR CLEAR 05/11/2015 1612   LABSPEC 1.013 05/11/2015 1612   PHURINE 5.0 05/11/2015 1612   GLUCOSEU NEGATIVE 05/11/2015 1612   HGBUR NEGATIVE 05/11/2015 1612   BILIRUBINUR NEGATIVE 05/11/2015 1612    KETONESUR NEGATIVE 05/11/2015 1612   PROTEINUR NEGATIVE 05/11/2015 1612   UROBILINOGEN 0.2 08/26/2012 0930   NITRITE NEGATIVE 05/11/2015 1612   LEUKOCYTESUR NEGATIVE 05/11/2015 1612   Sepsis Labs Invalid input(s): PROCALCITONIN,  WBC,  LACTICIDVEN Microbiology Recent Results (from the past 240 hour(s))  MRSA PCR Screening     Status: Abnormal   Collection Time: 11/17/15  3:28 AM  Result Value Ref Range Status   MRSA by PCR POSITIVE (A) NEGATIVE Final    Comment: RESULT CALLED TO, READ BACK BY AND VERIFIED WITH: SPANGLER, E AT 0900 BY HFLYNT 11/17/15        The GeneXpert MRSA Assay (FDA approved for NASAL specimens only), is one component of a comprehensive MRSA colonization  surveillance program. It is not intended to diagnose MRSA infection nor to guide or monitor treatment for MRSA infections.      Time coordinating discharge: Over 30 minutes  SIGNED:   Birdie Hopes, MD  [facility] 11/20/2015, 10:43 AM Pager   If 7PM-7AM, please contact night-coverage [email or web address] Password [email or web address]

## 2015-11-20 NOTE — Care Management Note (Signed)
Case Management Note  Patient Details  Name: [name] MRN: [id number] Date of Birth: 1950/11/10    Expected Discharge Date:  11/20/15               Expected Discharge Plan:  [place]  In-House Referral:  NA  Discharge planning Services  CM Consult  Post Acute Care Choice:    Choice offered to:  Patient  DME Arranged:    DME Agency:     HH Arranged:  RN [place] Agency:  [place]  Status of Service:  Completed, signed off  If discussed at [place] of Stay Meetings, dates discussed:    Additional Comments: Patient discharging home today. Has home oxygen PTA. He would like to have [facility] RN at discharge. He has used [facility] in the past and would like to use them again. Romualdo Bolk of [facility] notified and will obtain order from chart. Patient aware [facility] has 48 hours to initiative services.   Dawan Farney, Chauncey Reading, RN 11/20/2015, 12:33 PM

## 2015-11-29 ENCOUNTER — Telehealth: Payer: Self-pay | Admitting: Cardiology

## 2015-11-29 NOTE — Telephone Encounter (Signed)
According to documentation Isosorbide was d/c in 04/2015 d/t orthostatic hypotension.

## 2015-11-29 NOTE — Telephone Encounter (Signed)
vm for [name] of the last time he was seen in the office (07/20/15) he was not taking Imdur then.  According to documentation the Imdur was d/ced 4/17 d/t orthostatic hypotension.  Requested she call back if any further questions or concerns.

## 2015-11-29 NOTE — Telephone Encounter (Signed)
New Message:     Kennyth Lose from Dr Hassell Done [name]'s office wants to know if pt is taking Imdur?

## 2016-03-26 NOTE — Progress Notes (Deleted)
Cardiology Office Note    Date:  03/26/2016   ID:  [name], DOB 1950-09-08, MRN [id number]  PCP:  [name], MD  Cardiologist: [name]  CC: follow up   History of Present Illness:  [name] is a 66 y.o. male with a history of COPD, Type B aortic dissection, CAD s/p CABG x4V (2009) and DES to [place] (02/2015), chronic back pain on narcotics, hypothyroidism, tobacco abuse, depression and orthostatic hypotension who presents to clinic for follow up.   He has a history of CAD s/p NSTEMI in 07/2007 treated with emergency CABG. 7 months later he had recurrent chest pain and cardiac catheterization in 2010 revealed severe three-vessel CAD, EF 60%, mild MR, chronic thoracic aortic aneurysm dissection, occluded SVG to the RCA and occluded SVG to the OM, patent SVG to the diagonal and patent free LIMA to the ramus. The disease was too extensive and significant for PCI and medical therapy was recommended.  He was admitted to [facility] in 23/2017 for chest pain with slightly elevated troponin. 2-D echocardiogram revealed an EF of 50-55% with hypokinesis of the infero-lateral myocardium and G1DD,   He underwent cardiac catheterization which revealed severe 3 vessel CAD. S/p CABG with continued patency of the LIMA-LAD, SVG-diagonal, and free RIMA-ramus. Chronic occlusion of the SVG-PDA and SVG-OM. Distal RCA was 95% stenosed and he underwent successful PCI using a DES platform.  He was last seen by [name] in 06/2015 and doing okay. He has been admitted twice since that time in 07/2015 and 10/2015 for AECOPD.   Today he presents to clinic for follow up.    Past Medical History:  Diagnosis Date  . Anemia   . Anxiety   . Aortic dissection (HCC)    TYPE 3  . CAD (coronary artery disease)    multiple, most recently Jan-March 2017  . Cardiac tamponade   . Chronic bronchitis ([place])   . Chronic fatigue   . Chronic pain   . Constipation   . COPD (chronic obstructive pulmonary disease) ([place])   .  Depression   . Dysphagia, oropharyngeal phase   . ETOH abuse   . GERD (gastroesophageal reflux disease)   . Hiatal hernia   . HTN (hypertension)   . Hypercholesterolemia   . Metabolic encephalopathy   . Opioid abuse   . Peptic ulcer disease 08/2010   EGD   . PNA (pneumonia)   . Schatzki's ring     Past Surgical History:  Procedure Laterality Date  . BACK SURGERY    . CABG X 6  07/31/2007   [facility]  . CARDIAC CATHETERIZATION    . CARDIAC CATHETERIZATION N/A 03/16/2015   Procedure: Coronary/Graft Angiography;  Surgeon: Sherren Mocha, MD;  Location: [place] [facility];  Service: Cardiovascular;  Laterality: N/A;  . CARDIAC CATHETERIZATION N/A 03/16/2015   Procedure: Coronary Stent Intervention;  Surgeon: Sherren Mocha, MD;  Location: [place] [facility];  Service: Cardiovascular;  Laterality: N/A;  . KNEE SURGERY Right    acl  . OLECRANON BURSECTOMY Left 08/27/2012   Procedure: EXCISION LEFT OLECRANON BURSA;  Surgeon: Sanjuana Kava, MD;  Location: [facility];  Service: Orthopedics;  Laterality: Left;  . OLECRANON BURSECTOMY Left 05/25/2013   Procedure:  Excision sinus tract and tissue left elbow ;  Surgeon: Sanjuana Kava, MD;  Location: [facility];  Service: Orthopedics;  Laterality: Left;  . ORIF SCAPULAR FRACTURE Right   . SHOULDER SURGERY Left   . STERNAL WIRE REMOVEAL  12/31/2007  [facility]    Current Medications: Outpatient Medications Prior to Visit  Medication Sig Dispense Refill  . acetaminophen (TYLENOL 8 HOUR ARTHRITIS PAIN) 650 MG CR tablet Take 650 mg by mouth every 4 (four) hours as needed for pain.    Marland Kitchen albuterol (PROVENTIL) (2.5 MG/3ML) 0.083% nebulizer solution Take 3 mLs (2.5 mg total) by nebulization every 2 (two) hours as needed for wheezing or shortness of breath. 75 mL 12  . aspirin 81 MG chewable tablet Chew 1 tablet (81 mg total) by mouth daily. 30 tablet 0  . atorvastatin (LIPITOR) 20 MG tablet Take 1 tablet (20 mg total) by mouth daily.    . benzonatate  (TESSALON) 100 MG capsule Take 1 capsule (100 mg total) by mouth 2 (two) times daily as needed for cough. 20 capsule 0  . cyclobenzaprine (FLEXERIL) 10 MG tablet 1 tablet every 8 (eight) hours.    . feeding supplement, ENSURE ENLIVE, (ENSURE ENLIVE) LIQD Take 237 mLs by mouth 3 (three) times daily between meals. 237 mL 12  . Fluticasone-Salmeterol (ADVAIR DISKUS) 250-50 MCG/DOSE AEPB Inhale 1 puff into the lungs 2 (two) times daily. 1 each 11  . guaiFENesin (MUCINEX) 600 MG 12 hr tablet Take 2 tablets (1,200 mg total) by mouth 2 (two) times daily. 30 tablet 0  . levofloxacin (LEVAQUIN) 750 MG tablet Take 1 tablet (750 mg total) by mouth daily. 5 tablet 0  . metoprolol tartrate (LOPRESSOR) 25 MG tablet Take 0.5 tablets (12.5 mg total) by mouth 2 (two) times daily. 60 tablet 1  . mirtazapine (REMERON) 15 MG tablet Take 1 tablet (15 mg total) by mouth at bedtime. 30 tablet 1  . morphine (MS CONTIN) 100 MG 12 hr tablet 1 tablet 2 (two) times daily.    Marland Kitchen oxyCODONE-acetaminophen (PERCOCET/ROXICET) 5-325 MG tablet Take 1 tablet by mouth every 4 (four) hours as needed for severe pain.    . pantoprazole (PROTONIX) 40 MG tablet Take 1 tablet (40 mg total) by mouth at bedtime. 30 tablet 0  . predniSONE (DELTASONE) 10 MG tablet Take 6-5-4-3-2-1 PO daily till gone 21 tablet 0  . tiotropium (SPIRIVA) 18 MCG inhalation capsule Place 1 capsule (18 mcg total) into inhaler and inhale daily. 30 capsule 12   Facility-Administered Medications Prior to Visit  Medication Dose Route Frequency Provider Last Rate Last Dose  . vancomycin (VANCOCIN) 1,500 mg in sodium chloride 0.9 % 500 mL IVPB  1,500 mg Intravenous Once Sanjuana Kava, MD         Allergies:   Bee venom and Codeine   Social History   Social History  . Marital status: Married    Spouse name: N/A  . Number of children: N/A  . Years of education: N/A   Occupational History  . Dealer    Social History Main Topics  . Smoking status: Former Smoker      Packs/day: 1.00    Years: 20.00    Types: Cigarettes    Quit date: 07/22/2015  . Smokeless tobacco: Former Systems developer    Quit date: 06/05/2015     Comment: smoked one ppd "on and off" for 20 yrs. quit in May 17  . Alcohol use No  . Drug use: No  . Sexual activity: Not on file   Other Topics Concern  . Not on file   Social History Narrative   ** Merged History Encounter **         Family History:  The patient's ***family history includes Heart disease in his  father and mother.      *** ROS/PE    Wt Readings from Last 3 Encounters:  11/17/15 142 lb 10.2 oz (64.7 kg)  08/03/15 150 lb (68 kg)  07/20/15 147 lb (66.7 kg)      Studies/Labs Reviewed:   EKG:  EKG is*** ordered today.  The ekg ordered today demonstrates ***  Recent Labs: 08/03/2015: ALT 11 11/16/2015: TSH 5.291 11/18/2015: Hemoglobin 12.8; Platelets 218 11/20/2015: BUN 41; Creatinine, Ser 1.13; Magnesium 2.5; Potassium 4.7; Sodium 130   Lipid Panel    Component Value Date/Time   CHOL 204 (H) 03/15/2015 0558   TRIG 116 04/09/2015 0802   HDL 29 (L) 03/15/2015 0558   CHOLHDL 7.0 03/15/2015 0558   VLDL 26 03/15/2015 0558   LDLCALC 149 (H) 03/15/2015 0558    Additional studies/ records that were reviewed today include:  2D ECHO: 04/06/2015 LV EF: 55% -   60% Study Conclusions - Left ventricle: The cavity size was normal. Wall thickness was   increased in a pattern of mild LVH. Systolic function was normal.   The estimated ejection fraction was in the range of 55% to 60%.   Normal GLPSS at -19%. Wall motion was normal; there were no   regional wall motion abnormalities. Doppler parameters are   consistent with abnormal left ventricular relaxation (grade 1   diastolic dysfunction). The E/e&' ratio is between 8-15,   suggesting indeterminate LV filling pressure. - Aortic valve: Trileaflet. Sclerosis without stenosis. There was   no regurgitation. - Left atrium: The atrium was normal in size. - Atrial  septum: Aneurysmal IAS - cannot exclude PFO. - Inferior vena cava: The vessel was normal in size. The   respirophasic diameter changes were in the normal range (= 50%),   consistent with normal central venous pressure. - Pericardium, extracardiac: There was no pericardial effusion. Impressions: - LVEF 55-60%, mild LVH, normal wall motion, diastolic dysfunction   with indeterminate LV filling pressure, normal LA size, no   pericardial effusion, normal caliber ascending and descending   aorta partially visualized without obvious dissection flap.  Cath 03/16/15 Coronary Stent Intervention  Coronary/Graft Angiography  Conclusion    SVG .  100% occluded  Origin lesion, 100% stenosed.  Ost RCA to Prox RCA lesion, 50% stenosed.  SVG .  This is a sequential graft with an SVG-diagonal and free RIMA that arises from the body of the SVG and goes to the ramus intermedius. The graft is patent throughout  Mid RCA lesion, 30% stenosed.  LIMA .  Patent graft  SVG .  Chronically occluded  Prox Graft lesion, 100% stenosed.  LM lesion, 50% stenosed.  Prox LAD lesion, 100% stenosed.  Prox Cx lesion, 99% stenosed.  Dist RCA lesion, 95% stenosed. Post intervention, there is a 0% residual stenosis.   1. Severe 3 vessel CAD 2. S/p CABG with continued patency of the LIMA-LAD, SVG-diagonal, and free RIMA-ramus 3. Chronic occlusion of the SVG-PDA and SVG-OM 4. Successful PCI of critical stenosis in the native RCA using a DES platform  ASA/Brilinta x 12 months as tolerated. Should be ok for hospital discharge tomorrow as long as no complications arise.        ASSESSMENT & PLAN:   CAD s/p CABG:  Chronic Type B aortic dissection:   COPD:   Tobacco abuse:   HTN:  HLD:  Orthostatic hypotension:    Medication Adjustments/Labs and Tests Ordered: Current medicines are reviewed at length with the patient today.  Concerns regarding medicines  are outlined above.  Medication  changes, Labs and Tests ordered today are listed in the Patient Instructions below. There are no Patient Instructions on file for this visit.   Signed, Angelena Form, PA-C  03/26/2016 3:53 PM    [facility] [address], [place], [place]  [address] Phone: [phone]; Fax: [phone]

## 2016-03-27 ENCOUNTER — Telehealth: Payer: Self-pay | Admitting: Internal Medicine

## 2016-03-27 ENCOUNTER — Ambulatory Visit: Payer: Self-pay | Admitting: Physician Assistant

## 2016-03-27 NOTE — Telephone Encounter (Signed)
Pt's PCP is calling wanting to schedule an appointment with MW for COPD. Pt last saw CY in 2016.  MW - would you be okay with seeing this pt? Thanks.

## 2016-03-27 NOTE — Telephone Encounter (Signed)
ATC Dr. Durenda Age office, office is closed.   wcb tomorrow.

## 2016-03-27 NOTE — Telephone Encounter (Signed)
Fine with me

## 2016-03-28 NOTE — Telephone Encounter (Signed)
Dr. Durenda Age office returned phone call. Per message, I scheduled consult with Dr. Melvyn Novas on 04/30/2016. Dr. Durenda Age office will notify patient of appointment.

## 2016-03-28 NOTE — Telephone Encounter (Signed)
lmomtcb  x1 for [place].  Will need to make consult appt with MW for COPD>

## 2016-04-09 NOTE — Progress Notes (Deleted)
Cardiology Office Note    Date:  04/09/2016   ID:  [name], DOB April 17, 1950, MRN [id number]  PCP:  [name], MD  Cardiologist: [name]  CC: follow up   History of Present Illness:  [name] is a 66 y.o. male with a history of COPD, Type B aortic dissection, CAD s/p CABG x4V (2009) and DES to [place] (02/2015), chronic back pain on narcotics, hypothyroidism, tobacco abuse, depression and orthostatic hypotension who presents to clinic for follow up.   He has a history of CAD s/p NSTEMI in 07/2007 treated with emergency CABG. 7 months later he had recurrent chest pain and cardiac catheterization in 2010 revealed severe three-vessel CAD, EF 60%, mild MR, chronic thoracic aortic aneurysm dissection, occluded SVG to the RCA and occluded SVG to the OM, patent SVG to the diagonal and patent free LIMA to the ramus. The disease was too extensive and significant for PCI and medical therapy was recommended.  He was admitted to [facility] in 23/2017 for chest pain with slightly elevated troponin. 2-D echocardiogram revealed an EF of 50-55% with hypokinesis of the infero-lateral myocardium and G1DD,  He underwent cardiac catheterization which revealed severe 3 vessel CAD. S/p CABG with continued patency of the LIMA-LAD, SVG-diagonal, and free RIMA-ramus. Chronic occlusion of the SVG-PDA and SVG-OM. Distal RCA was 95% stenosed and he underwent successful PCI using a DES platform.  He was last seen by [name] in 06/2015 and doing okay. He has been admitted twice since that time in 07/2015 and 10/2015 for AECOPD.   Today he presents to clinic for follow up.      Past Medical History:  Diagnosis Date  . Anemia   . Anxiety   . Aortic dissection (HCC)    TYPE 3  . CAD (coronary artery disease)    multiple, most recently Jan-March 2017  . Cardiac tamponade   . Chronic bronchitis ([place])   . Chronic fatigue   . Chronic pain   . Constipation   . COPD (chronic obstructive pulmonary  disease) ([place])   . Depression   . Dysphagia, oropharyngeal phase   . ETOH abuse   . GERD (gastroesophageal reflux disease)   . Hiatal hernia   . HTN (hypertension)   . Hypercholesterolemia   . Metabolic encephalopathy   . Opioid abuse   . Peptic ulcer disease 08/2010   EGD   . PNA (pneumonia)   . Schatzki's ring     Past Surgical History:  Procedure Laterality Date  . BACK SURGERY    . CABG X 6  07/31/2007   [facility]  . CARDIAC CATHETERIZATION    . CARDIAC CATHETERIZATION N/A 03/16/2015   Procedure: Coronary/Graft Angiography;  Surgeon: Sherren Mocha, MD;  Location: [place] [facility];  Service: Cardiovascular;  Laterality: N/A;  . CARDIAC CATHETERIZATION N/A 03/16/2015   Procedure: Coronary Stent Intervention;  Surgeon: Sherren Mocha, MD;  Location: [place] [facility];  Service: Cardiovascular;  Laterality: N/A;  . KNEE SURGERY Right    acl  . OLECRANON BURSECTOMY Left 08/27/2012   Procedure: EXCISION LEFT OLECRANON BURSA;  Surgeon: Sanjuana Kava, MD;  Location: [facility];  Service: Orthopedics;  Laterality: Left;  . OLECRANON BURSECTOMY Left 05/25/2013   Procedure:  Excision sinus tract and tissue left elbow ;  Surgeon: Sanjuana Kava, MD;  Location: [facility];  Service: Orthopedics;  Laterality: Left;  . ORIF SCAPULAR FRACTURE Right   . SHOULDER SURGERY Left   . STERNAL WIRE REMOVEAL  12/31/2007   [facility]    Current Medications: Outpatient Medications Prior to Visit  Medication Sig Dispense Refill  . acetaminophen (TYLENOL 8 HOUR ARTHRITIS PAIN) 650 MG CR tablet Take 650 mg by mouth every 4 (four) hours as needed for pain.    Marland Kitchen albuterol (PROVENTIL) (2.5 MG/3ML) 0.083% nebulizer solution Take 3 mLs (2.5 mg total) by nebulization every 2 (two) hours as needed for wheezing or shortness of breath. 75 mL 12  . aspirin 81 MG chewable tablet Chew 1 tablet (81 mg total) by mouth daily. 30 tablet 0  . atorvastatin (LIPITOR) 20 MG tablet Take 1 tablet (20 mg total) by mouth daily.      . benzonatate (TESSALON) 100 MG capsule Take 1 capsule (100 mg total) by mouth 2 (two) times daily as needed for cough. 20 capsule 0  . cyclobenzaprine (FLEXERIL) 10 MG tablet 1 tablet every 8 (eight) hours.    . feeding supplement, ENSURE ENLIVE, (ENSURE ENLIVE) LIQD Take 237 mLs by mouth 3 (three) times daily between meals. 237 mL 12  . Fluticasone-Salmeterol (ADVAIR DISKUS) 250-50 MCG/DOSE AEPB Inhale 1 puff into the lungs 2 (two) times daily. 1 each 11  . guaiFENesin (MUCINEX) 600 MG 12 hr tablet Take 2 tablets (1,200 mg total) by mouth 2 (two) times daily. 30 tablet 0  . levofloxacin (LEVAQUIN) 750 MG tablet Take 1 tablet (750 mg total) by mouth daily. 5 tablet 0  . metoprolol tartrate (LOPRESSOR) 25 MG tablet Take 0.5 tablets (12.5 mg total) by mouth 2 (two) times daily. 60 tablet 1  . mirtazapine (REMERON) 15 MG tablet Take 1 tablet (15 mg total) by mouth at bedtime. 30 tablet 1  . morphine (MS CONTIN) 100 MG 12 hr tablet 1 tablet 2 (two) times daily.    Marland Kitchen oxyCODONE-acetaminophen (PERCOCET/ROXICET) 5-325 MG tablet Take 1 tablet by mouth every 4 (four) hours as needed for severe pain.    . pantoprazole (PROTONIX) 40 MG tablet Take 1 tablet (40 mg total) by mouth at bedtime. 30 tablet 0  . predniSONE (DELTASONE) 10 MG tablet Take 6-5-4-3-2-1 PO daily till gone 21 tablet 0  . tiotropium (SPIRIVA) 18 MCG inhalation capsule Place 1 capsule (18 mcg total) into inhaler and inhale daily. 30 capsule 12   Facility-Administered Medications Prior to Visit  Medication Dose Route Frequency Provider Last Rate Last Dose  . vancomycin (VANCOCIN) 1,500 mg in sodium chloride 0.9 % 500 mL IVPB  1,500 mg Intravenous Once Sanjuana Kava, MD         Allergies:   Bee venom and Codeine   Social History   Social History  . Marital status: Married    Spouse name: N/A  . Number of children: N/A  . Years of education: N/A   Occupational History  . Dealer    Social History Main Topics  . Smoking status:  Former Smoker    Packs/day: 1.00    Years: 20.00    Types: Cigarettes    Quit date: 07/22/2015  . Smokeless tobacco: Former Systems developer    Quit date: 06/05/2015     Comment: smoked one ppd "on and off" for 20 yrs. quit in May 17  . Alcohol use No  . Drug use: No  . Sexual activity: Not on file   Other Topics Concern  . Not on file   Social History Narrative   ** Merged History Encounter **         Family History:  The patient's ***family history includes Heart  disease in his father and mother.      *** ROS/PE    Wt Readings from Last 3 Encounters:  11/17/15 142 lb 10.2 oz (64.7 kg)  08/03/15 150 lb (68 kg)  07/20/15 147 lb (66.7 kg)      Studies/Labs Reviewed:   EKG:  EKG is*** ordered today.  The ekg ordered today demonstrates ***  Recent Labs: 08/03/2015: ALT 11 11/16/2015: TSH 5.291 11/18/2015: Hemoglobin 12.8; Platelets 218 11/20/2015: BUN 41; Creatinine, Ser 1.13; Magnesium 2.5; Potassium 4.7; Sodium 130   Lipid Panel    Component Value Date/Time   CHOL 204 (H) 03/15/2015 0558   TRIG 116 04/09/2015 0802   HDL 29 (L) 03/15/2015 0558   CHOLHDL 7.0 03/15/2015 0558   VLDL 26 03/15/2015 0558   LDLCALC 149 (H) 03/15/2015 0558    Additional studies/ records that were reviewed today include:  2D ECHO: 04/06/2015 LV EF: 55% - 60% Study Conclusions - Left ventricle: The cavity size was normal. Wall thickness was increased in a pattern of mild LVH. Systolic function was normal. The estimated ejection fraction was in the range of 55% to 60%. Normal GLPSS at -19%. Wall motion was normal; there were no regional wall motion abnormalities. Doppler parameters are consistent with abnormal left ventricular relaxation (grade 1 diastolic dysfunction). The E/e&' ratio is between 8-15, suggesting indeterminate LV filling pressure. - Aortic valve: Trileaflet. Sclerosis without stenosis. There was no regurgitation. - Left atrium: The atrium was normal in  size. - Atrial septum: Aneurysmal IAS - cannot exclude PFO. - Inferior vena cava: The vessel was normal in size. The respirophasic diameter changes were in the normal range (= 50%), consistent with normal central venous pressure. - Pericardium, extracardiac: There was no pericardial effusion. Impressions: - LVEF 55-60%, mild LVH, normal wall motion, diastolic dysfunction with indeterminate LV filling pressure, normal LA size, no pericardial effusion, normal caliber ascending and descending aorta partially visualized without obvious dissection flap.  Cath 03/16/15 Coronary Stent Intervention  Coronary/Graft Angiography  Conclusion    SVG .  100% occluded  Origin lesion, 100% stenosed.  Ost RCA to Prox RCA lesion, 50% stenosed.  SVG .  This is a sequential graft with an SVG-diagonal and free RIMA that arises from the body of the SVG and goes to the ramus intermedius. The graft is patent throughout  Mid RCA lesion, 30% stenosed.  LIMA .  Patent graft  SVG .  Chronically occluded  Prox Graft lesion, 100% stenosed.  LM lesion, 50% stenosed.  Prox LAD lesion, 100% stenosed.  Prox Cx lesion, 99% stenosed.  Dist RCA lesion, 95% stenosed. Post intervention, there is a 0% residual stenosis.  1. Severe 3 vessel CAD 2. S/p CABG with continued patency of the LIMA-LAD, SVG-diagonal, and free RIMA-ramus 3. Chronic occlusion of the SVG-PDA and SVG-OM 4. Successful PCI of critical stenosis in the native RCA using a DES platform  ASA/Brilinta x 12 months as tolerated. Should be ok for hospital discharge tomorrow as long as no complications arise.        ASSESSMENT & PLAN:   CAD s/p CABG:  Chronic Type B aortic dissection:   COPD:   Tobacco abuse:   HTN:  HLD:  Orthostatic hypotension:     Medication Adjustments/Labs and Tests Ordered: Current medicines are reviewed at length with the patient today.  Concerns regarding medicines  are outlined above.  Medication changes, Labs and Tests ordered today are listed in the Patient Instructions below. There are no Patient Instructions on  file for this visit.   Signed, Angelena Form, PA-C  04/09/2016 8:54 PM    [facility] [address], [place], [place]  [address] Phone: [phone]; Fax: [phone]

## 2016-04-10 ENCOUNTER — Ambulatory Visit: Payer: Medicare PPO | Admitting: Physician Assistant

## 2016-04-16 ENCOUNTER — Encounter: Payer: Self-pay | Admitting: Physician Assistant

## 2016-04-19 ENCOUNTER — Encounter: Payer: Self-pay | Admitting: Physician Assistant

## 2016-04-30 ENCOUNTER — Encounter: Payer: Self-pay | Admitting: Internal Medicine

## 2016-04-30 ENCOUNTER — Ambulatory Visit (INDEPENDENT_AMBULATORY_CARE_PROVIDER_SITE_OTHER): Payer: Medicare PPO | Admitting: Internal Medicine

## 2016-04-30 VITALS — BP 128/72 | HR 75 | Ht 68.0 in | Wt 143.0 lb

## 2016-04-30 DIAGNOSIS — R0609 Other forms of dyspnea: Secondary | ICD-10-CM

## 2016-04-30 DIAGNOSIS — J449 Chronic obstructive pulmonary disease, unspecified: Secondary | ICD-10-CM

## 2016-04-30 DIAGNOSIS — F17210 Nicotine dependence, cigarettes, uncomplicated: Secondary | ICD-10-CM | POA: Diagnosis not present

## 2016-04-30 DIAGNOSIS — J9611 Chronic respiratory failure with hypoxia: Secondary | ICD-10-CM | POA: Diagnosis not present

## 2016-04-30 MED ORDER — BUDESONIDE-FORMOTEROL FUMARATE 160-4.5 MCG/ACT IN AERO: 2.0000 | INHALATION_SPRAY | Freq: Two times a day (BID) | RESPIRATORY_TRACT | 0 refills | Status: DC

## 2016-04-30 MED ORDER — BUDESONIDE-FORMOTEROL FUMARATE 160-4.5 MCG/ACT IN AERO: 2.0000 | INHALATION_SPRAY | Freq: Two times a day (BID) | RESPIRATORY_TRACT | 11 refills | Status: DC

## 2016-04-30 NOTE — Progress Notes (Signed)
Subjective:     Patient ID: [name], male   DOB: 08/04/1950,   MRN: [id number]  HPI  51 yowm active smoker with h/o IHD then sob both with exertion and at rest and noct x summer 2016 eval by Dr Annamaria Boots in 11/14/14 rec anoro and temporarily improved but says could not get it refilled and since then just using saba in multiple forms so referred to pulmonary clinic 04/30/2016 by Dr   Earle Gell   04/30/2016 1st [facility] office visit/ [name] on saba only   Chief Complaint  Patient presents with  . Pulmonary Consult    Referred by Dr. Earle Gell for eval of COPD.  Pt states he was dxed with COPD approx 1 yr ago. He has been on o2 24/7 since July 2018.  He is using neb with albuterol and ventolin inhaler approx 3 x per day. He states that he feels SOB with exertion such as walking to the mailbox. He is sometimes SOB just sitting and occ wakes up with SOB.    Lives in [place]:   MB is  downhill and has to stop, uses inhaler and no problem at all walking back to house s 02  Never tried to  Use saba  before exertion/ last used one hour prior to OV  sob  can occur s warning at rest even if not assoc cough or cp   No obvious patterns in day to day or daytime variability or assoc excess/ purulent sputum or mucus plugs or hemoptysis or cp or chest tightness, subjective wheeze or overt sinus or hb symptoms. No unusual exp hx or h/o childhood pna/ asthma or knowledge of premature birth.  Sleeping ok without nocturnal  or early am exacerbation  of respiratory  c/o's or need for noct saba. Also denies any obvious fluctuation of symptoms with weather or environmental changes or other aggravating or alleviating factors except as outlined above   Current Medications, Allergies, Complete Past Medical History, Past Surgical History, Family History, and Social History were reviewed in [employer] record.  ROS  The following are not active complaints unless bolded sore throat,  dysphagia, dental problems, itching, sneezing,  nasal congestion or excess/ purulent secretions, ear ache,   fever, chills, sweats, unintended wt loss, classically pleuritic or exertional cp,  orthopnea pnd or leg swelling, presyncope, palpitations, abdominal pain, anorexia, nausea, vomiting, diarrhea  or change in bowel or bladder habits, change in stools or urine, dysuria,hematuria,  rash, arthralgias, visual complaints, headache, numbness, weakness or ataxia or problems with walking or coordination,  change in mood/affect or memory.         Review of Systems     Objective:   Physical Exam    amb wm nad   Wt Readings from Last 3 Encounters:  04/30/16 143 lb (64.9 kg)  11/17/15 142 lb 10.2 oz (64.7 kg)  08/03/15 150 lb (68 kg)    Vital signs reviewed -  - Note on arrival 02 sats  95% on 3lpm   HEENT: nl   turbinates bilaterally, and oropharynx. Nl external ear canals without cough reflex - edentulous    NECK :  without JVD/Nodes/TM/ nl carotid upstrokes bilaterally   LUNGS: no acc muscle use,  slt barrel contour chest with slt diminished bs and insp/ exp rhonchi bilaterally    CV:  RRR  no s3 or murmur or increase in P2, and no edema   ABD:  soft and nontender with nl inspiratory  excursion in the supine position. No bruits or organomegaly appreciated, bowel sounds nl  MS:  Nl gait/ ext warm without deformities, calf tenderness, cyanosis or clubbing No obvious joint restrictions   SKIN: warm and dry without lesions    NEURO:  alert, approp, nl sensorium with  no motor or cerebellar deficits apparent.    CXR PA and Lateral:   04/30/2016 :    I personally reviewed images and agree with radiology impression as follows:        Did not go for labs or xrays ordered      Assessment:

## 2016-04-30 NOTE — Patient Instructions (Addendum)
Plan A = Automatic = symbicort 160 Take 2 puffs first thing in am and then another 2 puffs about 12 hours later.   Work on inhaler technique:  relax and gently blow all the way out then take a nice smooth deep breath back in, triggering the inhaler at same time you start breathing in.  Hold for up to 5 seconds if you can. Blow out thru nose. Rinse and gargle with water when done      Plan B = Backup Only use your albuterol (VENTOLIN)  as a rescue medication to be used if you can't catch your breath by resting or doing a relaxed purse lip breathing pattern.  - The less you use it, the better it will work when you need it. - Ok to use the inhaler up to 2 puffs  every 4 hours if you must but call for appointment if use goes up over your usual need - Don't leave home without it !!  (think of it like the spare tire for your car)   Plan C = Crisis - only use your albuterol nebulizer if you first try Plan B and it fails to help > ok to use the nebulizer up to every 4 hours but if start needing it regularly call for immediate appointment  The key is to stop smoking completely before smoking completely stops you!   Please remember to go to the lab and x-ray department downstairs in the basement  for your tests - we will call you with the results when they are available.     Please schedule a follow up office visit in 6 weeks, call sooner if needed with pfts first

## 2016-04-30 NOTE — Assessment & Plan Note (Signed)

## 2016-05-01 DIAGNOSIS — J9611 Chronic respiratory failure with hypoxia: Secondary | ICD-10-CM | POA: Insufficient documentation

## 2016-05-01 NOTE — Assessment & Plan Note (Signed)
Symptoms are markedly disproportionate to objective findings and not clear this is a lung problem but pt does appear to have difficult airway management issues. The differential diagnosis of difficult to control airways disorders is extensive with no quick and easy answers but easy to remember because it consists of 13 A's,  Two Bs and one C: 1. Adherence, always a challenge and the leading suspect.  2. Acid reflux disease, with the greater proportion of pulmonary patients with no overt heartburn symptoms, and no easy way to treat non-acid reflux 3. Ace inhibitor use, the side effects of which give  even experienced pulmonologists a challenge sorting out (it's not all about the dry cough, though if cough is present ace's will need to be stopped for at least a month to tease out this component) 4. Active sinus dz, best addressed by a sinus ct 5. Active smoking,  Usually sureptitious in this setting 6. Allergic diseases, usually with a hx dating back to childhood with prominent allergic rhinitis features in up 90% of pts 7. Aspiration, a perennial problem in the elderly or other patients at risk 8. Allergic Bronchopulmonary Aspergillosis, associated with IgE's in the thousands 9. Alpha one Antitrypsin deficiency, a must screen in patients with chronic airflow obstruction syndromes out of proportion to smoking history. 10. Adverse effect of inhalers, especially DPI's and especially with poor inhaler technique 11 Anxiety, always a diagnosis of exclusion 12. A bunch of PE's ie moderately large clot burden, a few small ones peripherally can cause pleuritic cp syndromes but not unexplained dyspnea 13 Anemia or Thyroid disorders, easily excluded with standard labs but frequently overlooked in the chronically symptomatic/ frequent return pt. Two B's 1. Bronchiectasis:  Pos CT is the sine que non here 2  Beta blocker effects:  Coreg and Timolol use are pervasive in the adult population and both have  significant spillover effects on the airways One C 1. Congestive heart failure,easily  ruled out now with BNP level of < 100 when symptomatic    Pt did not go to lab or radiology to sort thru above ddx

## 2016-05-01 NOTE — Assessment & Plan Note (Signed)
Very poor insight into how/ when to use 02 but for now rec 3lpm 24/7

## 2016-05-01 NOTE — Assessment & Plan Note (Signed)
Active smoker - 04/30/2016  After extensive coaching HFA effectiveness =    75% > try symbicort 160 2bid   Spirometry today is uninterpretible but clinically he has at least moderate dz with active AB so best initial rx here is symb 160 2bid and then regroup in 6 weeks   Total time devoted to counseling  > 50 % of initial 60 min office visit:  review case with pt/ discussion of options/alternatives/ personally creating written customized instructions  in presence of pt  then going over those specific  Instructions directly with the pt including how to use all of the meds but in particular covering each new medication in detail and the difference between the maintenance= "automatic" meds and the prns using an action plan format for the latter (If this problem/symptom => do that organization reading Left to right).  Please see AVS from this visit for a full list of these instructions which I personally wrote for this pt and  are unique to this visit.

## 2016-05-06 ENCOUNTER — Telehealth: Payer: Self-pay | Admitting: Internal Medicine

## 2016-05-06 MED ORDER — BUDESONIDE-FORMOTEROL FUMARATE 160-4.5 MCG/ACT IN AERO: 2.0000 | INHALATION_SPRAY | Freq: Two times a day (BID) | RESPIRATORY_TRACT | 11 refills | Status: DC

## 2016-05-06 NOTE — Telephone Encounter (Signed)
rx sent to preferred pharmacy.  Pt aware.  Nothing further needed.  

## 2016-07-08 ENCOUNTER — Ambulatory Visit: Payer: Self-pay | Admitting: Internal Medicine

## 2016-07-29 ENCOUNTER — Ambulatory Visit: Payer: Self-pay | Admitting: Internal Medicine

## 2016-09-10 ENCOUNTER — Ambulatory Visit: Payer: Self-pay | Admitting: Internal Medicine

## 2016-10-11 ENCOUNTER — Encounter: Payer: Self-pay | Admitting: Internal Medicine

## 2016-10-11 ENCOUNTER — Ambulatory Visit (INDEPENDENT_AMBULATORY_CARE_PROVIDER_SITE_OTHER): Payer: Medicare PPO | Admitting: Internal Medicine

## 2016-10-11 ENCOUNTER — Ambulatory Visit (INDEPENDENT_AMBULATORY_CARE_PROVIDER_SITE_OTHER)
Admission: RE | Admit: 2016-10-11 | Discharge: 2016-10-11 | Disposition: A | Discharge status: Home / Self Care | Payer: Medicare PPO | Source: Ambulatory Visit | Attending: Internal Medicine | Admitting: Internal Medicine

## 2016-10-11 VITALS — BP 144/90 | HR 89 | Ht 68.0 in | Wt 144.0 lb

## 2016-10-11 DIAGNOSIS — F17210 Nicotine dependence, cigarettes, uncomplicated: Secondary | ICD-10-CM | POA: Diagnosis not present

## 2016-10-11 DIAGNOSIS — J449 Chronic obstructive pulmonary disease, unspecified: Secondary | ICD-10-CM

## 2016-10-11 DIAGNOSIS — J9611 Chronic respiratory failure with hypoxia: Secondary | ICD-10-CM | POA: Diagnosis not present

## 2016-10-11 NOTE — Assessment & Plan Note (Addendum)
Active smoker - 04/30/2016   try symbicort 160 2bid   - 10/11/2016  After extensive coaching HFA effectiveness =    75 % from a baselne of 50%   He needs to inhale his medications more effectively and his cigarettes a lot less (see separate a/p) but in the absence of the latter there is little we can offer for the cough as it represents CB/poor mucociliary fxn from smoking so rec add mucinex or mucinex dm max doses and focus on smoking cessaton   Needs cxr and return for pfts in 6 weeks    I had an extended discussion with the patient reviewing all relevant studies completed to date and  lasting 15 to 20 minutes of a 25 minute visit    Each maintenance medication was reviewed in detail including most importantly the difference between maintenance and prns and under what circumstances the prns are to be triggered using an action plan format that is not reflected in the computer generated alphabetically organized AVS.    Please see AVS for specific instructions unique to this visit that I personally wrote and verbalized to the the pt in detail and then reviewed with pt  by my nurse highlighting any  changes in therapy recommended at today's visit to their plan of care.

## 2016-10-11 NOTE — Assessment & Plan Note (Signed)
As of 10/11/2016  =  3lpm 24/7 but 4lpm walking or titrate to keep over 90%

## 2016-10-11 NOTE — Patient Instructions (Addendum)
Cough / congestion > mucinex or mucinex dm up to 1200 mg every 12 hours as needed   Monitor 02 sats with exertion and adjust to keep it over 90%  Work on inhaler technique:  relax and gently blow all the way out then take a nice smooth deep breath back in, triggering the inhaler at same time you start breathing in.  Hold for up to 5 seconds if you can. Blow out thru nose. Rinse and gargle with water when done     Please remember to go to the  x-ray department downstairs in the basement  for your tests - we will call you with the results when they are available.  The key is to stop smoking completely before smoking completely stops you!      Please schedule a follow up office visit in 6 weeks, call sooner if needed with pfts

## 2016-10-11 NOTE — Assessment & Plan Note (Signed)
>   3 m  Complains of the cost of chantix being the limiting but reviewed economics of smoking = cost of cigs plus cost of care from cigs and there really is no comparison but he is not committed to quitting at this point >  Follow up per Primary Care planned

## 2016-10-11 NOTE — Progress Notes (Signed)
Subjective:     Patient ID: [name], male   DOB: [birth year],   MRN: [id number]    Brief patient profile: 79 yowm active smoker with h/o IHD then sob both with exertion and at rest and noct x summer 2016 eval by Dr Annamaria Boots in 11/14/14 rec anoro and temporarily improved but says could not get it refilled and since then just using saba in multiple forms so referred to pulmonary clinic 04/30/2016 by Dr   Earle Gell and unable to perform spirometry    04/30/2016 1st [facility] office visit/ Ansel Ferrall on saba only   Chief Complaint  Patient presents with  . Pulmonary Consult    Referred by Dr. Earle Gell for eval of COPD.  Pt states he was dxed with COPD approx 1 yr ago. He has been on o2 24/7 since July 2018.  He is using neb with albuterol and ventolin inhaler approx 3 x per day. He states that he feels SOB with exertion such as walking to the mailbox. He is sometimes SOB just sitting and occ wakes up with SOB.    Lives in [place]:   MB is  downhill and has to stop, uses inhaler and no problem at all walking back to house s 02  Never tried to  Use saba  before exertion/ last used one hour prior to OV  sob  can occur s warning at rest even if not assoc cough or cp rec  Plan A = Automatic = symbicort 160 Take 2 puffs first thing in am and then another 2 puffs about 12 hours later.  Work on inhaler technique:  relax and gently blow all the way out then take a nice smooth deep breath back in, triggering the inhaler at same time you start breathing in.  Hold for up to 5 seconds if you can. Blow out thru nose. Rinse and gargle with water when done Plan B = Backup Only use your albuterol (VENTOLIN)  as a rescue medication Plan C = Crisis - only use your albuterol nebulizer if you first try Plan B and it fails to help > ok to use the nebulizer up to every 4 hours but if start needing it regularly call for immediate appointment The key is to stop smoking completely before smoking completely stops  you!    10/11/2016  f/u ov/Shoni Quijas re:  Copd / 02 dep with actiivty 4lpm and sleep 3lpm  And symb 160 2bid / still smoking Chief Complaint  Patient presents with  . Follow-up    He had stopped smoking for approx 1 month, and then started back due to stress after spouse had a stroke. He has a cough but no sputum production. He states his chest "burns". He has been having increased DOE over the past month- winded walking just from room to room.    saba once a day 3-6pm p am symb 6 am and hfa suboptimal and still smoking  Lots of rattling congestion but not able to bring up mucus/ sleeps ok on 3lpm / chest discomfort diffuse/ ant with coughing fits  Doe = MMRC3 = can't walk 100 yards even at a slow pace at a flat grade s stopping due to sob    No obvious day to day or daytime variability or assoc excess/ purulent sputum or mucus plugs or hemoptysis or  chest tightness, subjective wheeze or overt sinus or hb symptoms. No unusual exp hx or h/o childhood pna/ asthma or knowledge of premature birth.  Sleeping ok flat on 3lpm  without nocturnal  or early am exacerbation  of respiratory  c/o's or need for noct saba. Also denies any obvious fluctuation of symptoms with weather or environmental changes or other aggravating or alleviating factors except as outlined above   Current Allergies, Complete Past Medical History, Past Surgical History, Family History, and Social History were reviewed in [employer] record.  ROS  The following are not active complaints unless bolded sore throat, dysphagia, dental problems, itching, sneezing,  nasal congestion or disharge of excess mucus or purulent secretions, ear ache,   fever, chills, sweats, unintended wt loss or wt gain, classically pleuritic or exertional cp,  orthopnea pnd or leg swelling, presyncope, palpitations, abdominal pain, anorexia, nausea, vomiting, diarrhea  or change in bowel habits or bladder habits, change in stools or change in  urine, dysuria, hematuria,  rash, arthralgias, visual complaints, headache, numbness, weakness or ataxia or problems with walking or coordination,  change in mood/affect or memory.        Current Meds  Medication Sig  . acetaminophen (TYLENOL 8 HOUR ARTHRITIS PAIN) 650 MG CR tablet Take 650 mg by mouth every 4 (four) hours as needed for pain.  Marland Kitchen albuterol (PROVENTIL) (2.5 MG/3ML) 0.083% nebulizer solution Take 3 mLs (2.5 mg total) by nebulization every 2 (two) hours as needed for wheezing or shortness of breath.  Marland Kitchen albuterol (VENTOLIN HFA) 108 (90 Base) MCG/ACT inhaler Inhale 2 puffs into the lungs every 6 (six) hours as needed for wheezing or shortness of breath.  Marland Kitchen aspirin 81 MG chewable tablet Chew 1 tablet (81 mg total) by mouth daily.  . budesonide-formoterol (SYMBICORT) 160-4.5 MCG/ACT inhaler Inhale 2 puffs into the lungs 2 (two) times daily.  . cyclobenzaprine (FLEXERIL) 10 MG tablet 1 tablet every 8 (eight) hours.  . feeding supplement, ENSURE ENLIVE, (ENSURE ENLIVE) LIQD Take 237 mLs by mouth 3 (three) times daily between meals.  . metoprolol tartrate (LOPRESSOR) 25 MG tablet Take 0.5 tablets (12.5 mg total) by mouth 2 (two) times daily.  . mirtazapine (REMERON) 15 MG tablet Take 1 tablet (15 mg total) by mouth at bedtime.  Marland Kitchen morphine (MSIR) 15 MG tablet Take 1 tablet by mouth every 12 (twelve) hours.  Marland Kitchen oxyCODONE (ROXICODONE) 15 MG immediate release tablet Take 1 tablet by mouth every 8 (eight) hours as needed.  . OXYGEN 3-3.5lpm 24/7  . pantoprazole (PROTONIX) 40 MG tablet Take 1 tablet (40 mg total) by mouth at bedtime.                  Objective:   Physical Exam    amb wm nad   10/11/2016       144   04/30/16 143 lb (64.9 kg)  11/17/15 142 lb 10.2 oz (64.7 kg)  08/03/15 150 lb (68 kg)    Vital signs reviewed -  - Note on arrival 02 sats  98% on 4lpm   HEENT: nl   turbinates bilaterally, and oropharynx. Nl external ear canals without cough reflex - edentulous     NECK :  without JVD/Nodes/TM/ nl carotid upstrokes bilaterally   LUNGS: no acc muscle use,   Junky insp and exp rhonchi bilaterally   CV:  RRR  no s3 or murmur or increase in P2, and no edema   ABD:  soft and nontender with nl inspiratory excursion in the supine position. No bruits or organomegaly appreciated, bowel sounds nl  MS:  Nl gait/ ext warm without deformities, calf  tenderness, cyanosis or clubbing No obvious joint restrictions   SKIN: warm and dry without lesions    NEURO:  alert, approp, nl sensorium with  no motor or cerebellar deficits apparent.            Assessment:

## 2016-10-14 NOTE — Progress Notes (Signed)
Spoke with pt and notified of results per Dr. Wert. Pt verbalized understanding and denied any questions. 

## 2016-11-22 ENCOUNTER — Ambulatory Visit: Payer: Self-pay | Admitting: Internal Medicine

## 2017-03-17 ENCOUNTER — Ambulatory Visit
Admission: RE | Admit: 2017-03-17 | Discharge: 2017-03-17 | Disposition: A | Discharge status: Home / Self Care | Payer: Medicare PPO | Source: Ambulatory Visit | Attending: Nurse Practitioner | Admitting: Nurse Practitioner

## 2017-03-17 ENCOUNTER — Other Ambulatory Visit: Payer: Self-pay | Admitting: Nurse Practitioner

## 2017-03-17 DIAGNOSIS — J449 Chronic obstructive pulmonary disease, unspecified: Secondary | ICD-10-CM

## 2017-04-21 IMAGING — CR DG CHEST 1V PORT
1 series · 1 of 1 positions shown · non-contrast
Comparison: Chest x-ray and chest CT 04/03/2015

CLINICAL DATA: Hypoxia and respiratory failure.

EXAM:
PORTABLE CHEST 1 VIEW

[AP]
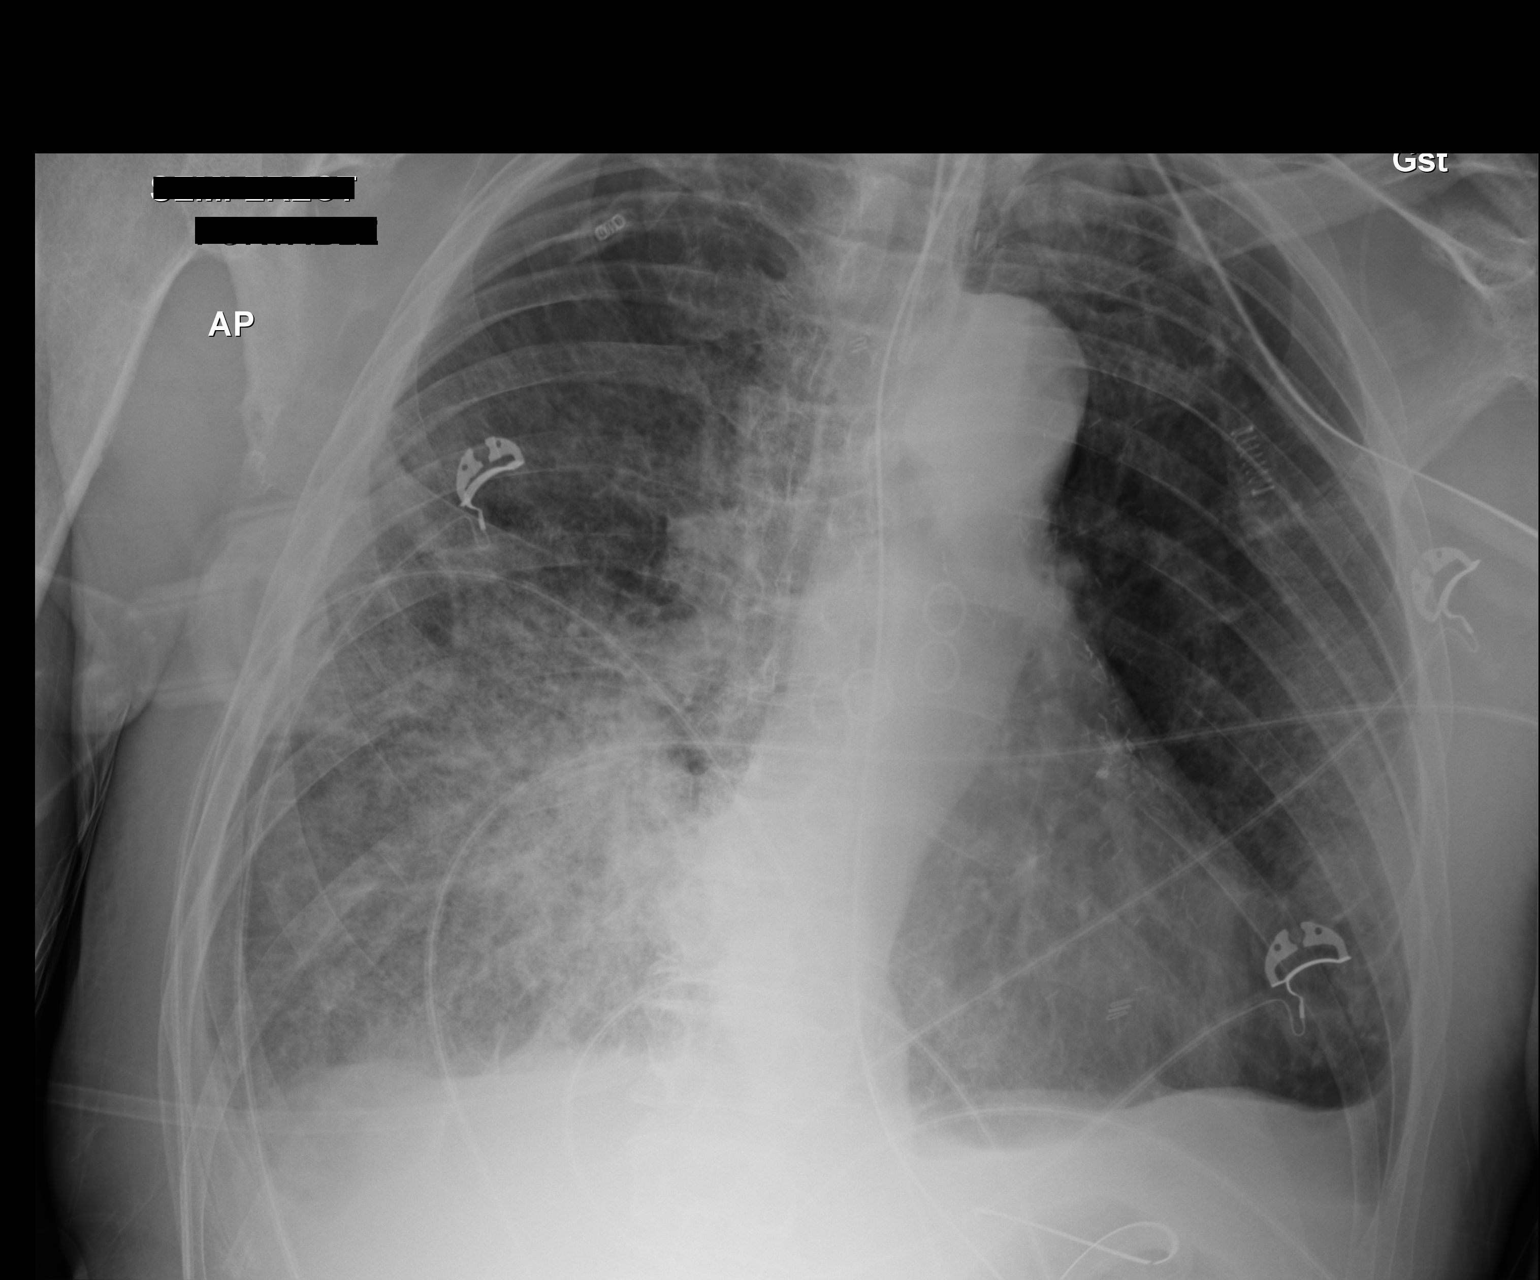

[1 of 1 positions shown; findings below may reference images not displayed]

FINDINGS: The support apparatus is stable. The endotracheal tube is 5 cm above
the carina. Cardiac silhouette, mediastinal and hilar contours are
stable. Interval improved aeration of the left upper lobe and right
upper lobe. Persistent right lower lobe infiltrate. Persistent small
effusions.
IMPRESSION: Stable support apparatus.

Slight interval improved aeration of the right upper lobe and left
upper lobe.

Persistent right lower lobe airspace consolidation.

## 2017-04-23 IMAGING — CR DG CHEST 1V PORT
1 series · 1 of 1 positions shown · non-contrast
Comparison: April 05, 2015.

CLINICAL DATA: Pneumonia.

EXAM:
PORTABLE CHEST 1 VIEW

[AP]
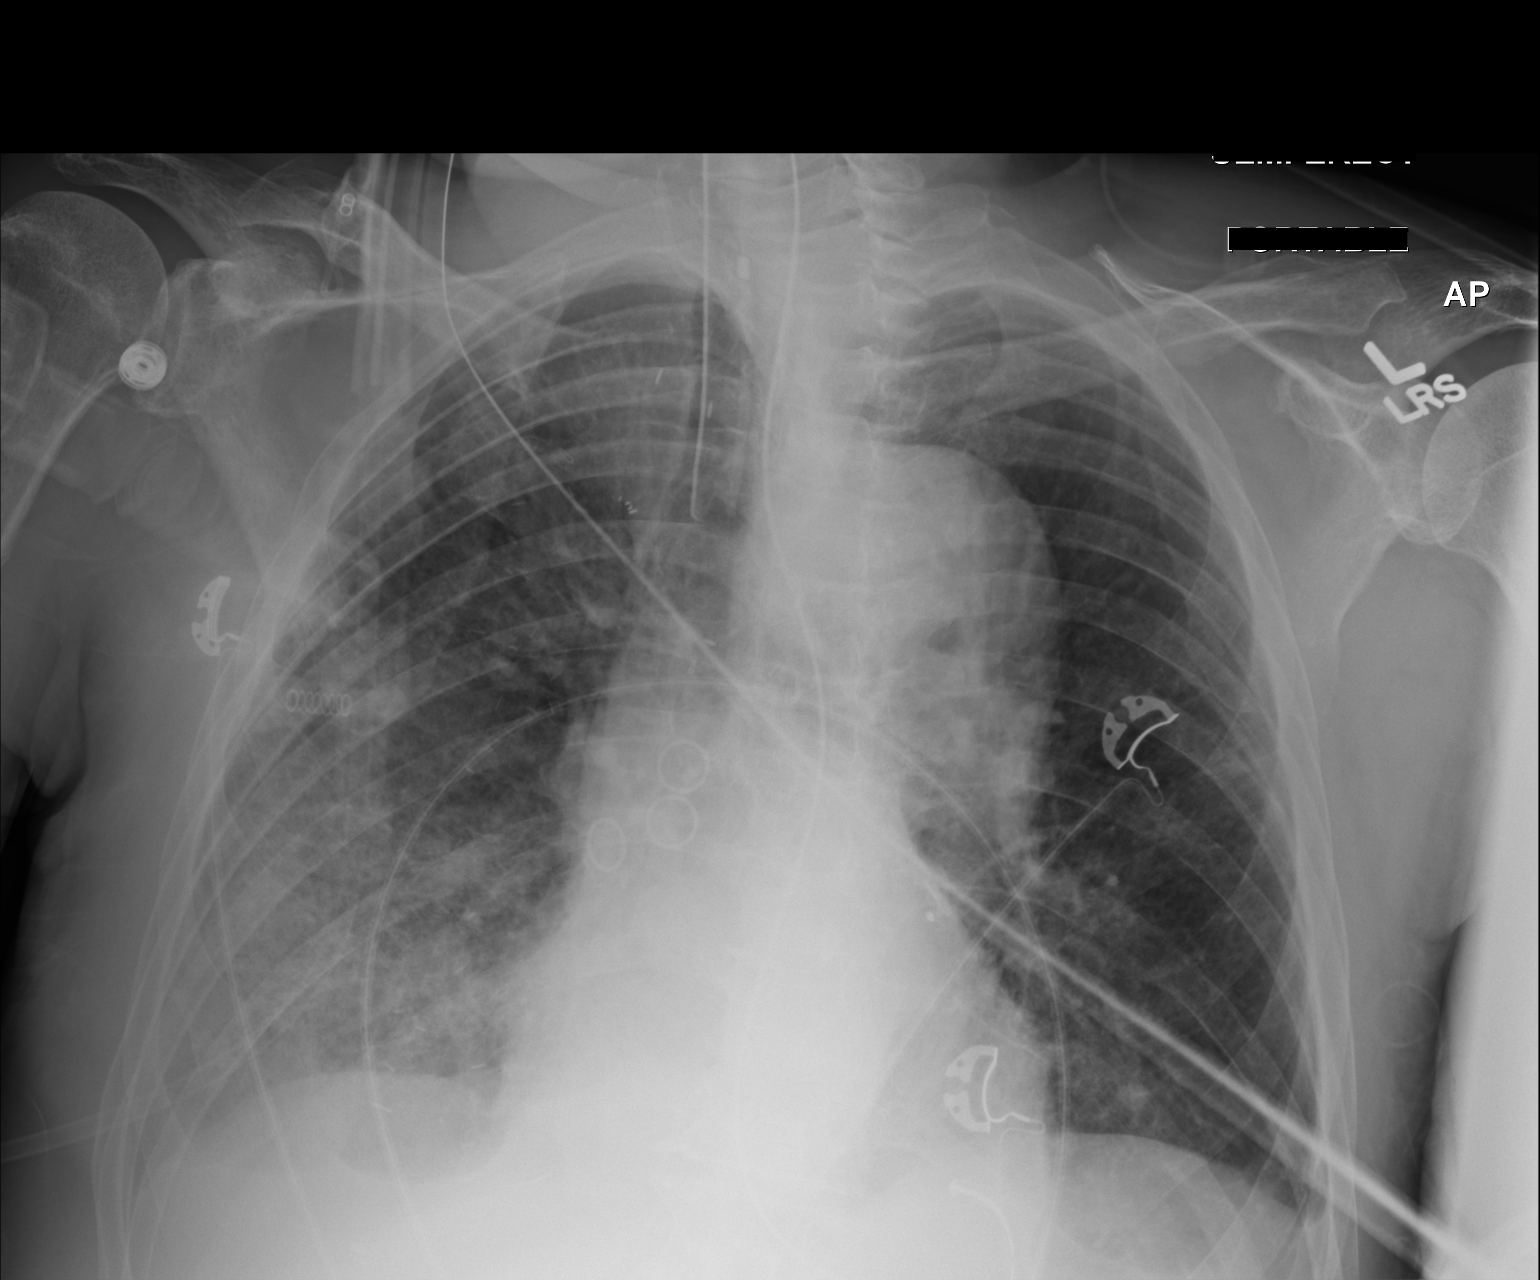

[1 of 1 positions shown; findings below may reference images not displayed]

FINDINGS: Stable cardiomediastinal silhouette. Status post coronary artery
bypass graft. Endotracheal and nasogastric tubes are unchanged in
position. No pneumothorax is noted. Minimal left basilar
subsegmental atelectasis is noted. Stable right basilar opacity is
noted concerning for edema or pneumonia with minimal associated
pleural effusion. Minimal left pleural effusion is noted as well.
Bony thorax is intact.
IMPRESSION: Stable right basilar opacity concerning for edema or pneumonia with
minimal associated pleural effusion.

## 2017-04-24 ENCOUNTER — Encounter: Payer: Self-pay | Admitting: Family Medicine

## 2017-04-24 ENCOUNTER — Other Ambulatory Visit: Payer: Self-pay

## 2017-04-24 ENCOUNTER — Encounter (HOSPITAL_COMMUNITY): Payer: Self-pay | Admitting: Emergency Medicine

## 2017-04-24 ENCOUNTER — Emergency Department (HOSPITAL_COMMUNITY)
Admission: EM | Admit: 2017-04-24 | Discharge: 2017-04-24 | Disposition: A | Discharge status: Home / Self Care | Payer: Medicare PPO | Attending: Emergency Medicine | Admitting: Emergency Medicine

## 2017-04-24 ENCOUNTER — Emergency Department (HOSPITAL_COMMUNITY): Payer: Medicare PPO

## 2017-04-24 DIAGNOSIS — Z79899 Other long term (current) drug therapy: Secondary | ICD-10-CM | POA: Diagnosis not present

## 2017-04-24 DIAGNOSIS — J449 Chronic obstructive pulmonary disease, unspecified: Secondary | ICD-10-CM | POA: Diagnosis not present

## 2017-04-24 DIAGNOSIS — F17210 Nicotine dependence, cigarettes, uncomplicated: Secondary | ICD-10-CM | POA: Diagnosis not present

## 2017-04-24 DIAGNOSIS — M546 Pain in thoracic spine: Secondary | ICD-10-CM | POA: Insufficient documentation

## 2017-04-24 DIAGNOSIS — Z7982 Long term (current) use of aspirin: Secondary | ICD-10-CM | POA: Insufficient documentation

## 2017-04-24 DIAGNOSIS — I10 Essential (primary) hypertension: Secondary | ICD-10-CM | POA: Diagnosis not present

## 2017-04-24 DIAGNOSIS — I2510 Atherosclerotic heart disease of native coronary artery without angina pectoris: Secondary | ICD-10-CM | POA: Insufficient documentation

## 2017-04-24 DIAGNOSIS — M549 Dorsalgia, unspecified: Secondary | ICD-10-CM | POA: Diagnosis present

## 2017-04-24 LAB — CBC WITH DIFFERENTIAL/PLATELET
Basophils Absolute: 0 10*3/uL (ref 0.0–0.1)
Basophils Relative: 0 %
Eosinophils Absolute: 0.1 10*3/uL (ref 0.0–0.7)
Eosinophils Relative: 1 %
HCT: 37.6 % — ABNORMAL LOW (ref 39.0–52.0)
Hemoglobin: 12.5 g/dL — ABNORMAL LOW (ref 13.0–17.0)
Lymphocytes Relative: 19 %
Lymphs Abs: 2.2 10*3/uL (ref 0.7–4.0)
MCH: 32 pg (ref 26.0–34.0)
MCHC: 33.2 g/dL (ref 30.0–36.0)
MCV: 96.2 fL (ref 78.0–100.0)
Monocytes Absolute: 0.5 10*3/uL (ref 0.1–1.0)
Monocytes Relative: 4 %
Neutro Abs: 8.8 10*3/uL — ABNORMAL HIGH (ref 1.7–7.7)
Neutrophils Relative %: 76 %
Platelets: 378 10*3/uL (ref 150–400)
RBC: 3.91 MIL/uL — ABNORMAL LOW (ref 4.22–5.81)
RDW: 14.4 % (ref 11.5–15.5)
WBC: 11.6 10*3/uL — ABNORMAL HIGH (ref 4.0–10.5)

## 2017-04-24 LAB — BASIC METABOLIC PANEL
Anion gap: 14 (ref 5–15)
BUN: 16 mg/dL (ref 6–20)
CO2: 21 mmol/L — ABNORMAL LOW (ref 22–32)
Calcium: 8.5 mg/dL — ABNORMAL LOW (ref 8.9–10.3)
Chloride: 103 mmol/L (ref 101–111)
Creatinine, Ser: 1.03 mg/dL (ref 0.61–1.24)
GFR calc Af Amer: >60 mL/min (ref >60)
GFR calc non Af Amer: >60 mL/min (ref >60)
Glucose, Bld: 162 mg/dL — ABNORMAL HIGH (ref 65–99)
Potassium: 3.7 mmol/L (ref 3.5–5.1)
Sodium: 138 mmol/L (ref 135–145)

## 2017-04-24 LAB — TROPONIN I: Troponin I: 0.04 ng/mL (ref <0.03)

## 2017-04-24 MED ORDER — IOPAMIDOL (ISOVUE-370) INJECTION 76%
INTRAVENOUS | Status: AC
Start: 2017-04-24 — End: 2017-04-24
  Administered 2017-04-24: 100 mL
  Filled 2017-04-24: qty 100

## 2017-04-24 MED ORDER — IOPAMIDOL (ISOVUE-370) INJECTION 76%: 100.0000 mL | Freq: Once | INTRAVENOUS | Status: DC | PRN

## 2017-04-24 MED ORDER — FENTANYL CITRATE (PF) 100 MCG/2ML IJ SOLN
50.0000 ug | INTRAMUSCULAR | Status: DC | PRN
  Administered 2017-04-24: 50 ug via INTRAVENOUS
  Filled 2017-04-24: qty 2

## 2017-04-24 MED ORDER — SODIUM CHLORIDE 0.9 % IV BOLUS
1000.0000 mL | Freq: Once | INTRAVENOUS | Status: AC
  Administered 2017-04-24: 1000 mL via INTRAVENOUS

## 2017-04-24 NOTE — ED Provider Notes (Signed)
[facility] EMERGENCY DEPARTMENT Provider Note   CSN: [id number] Arrival date & time: 04/24/17  1134     History   Chief Complaint Chief Complaint  Patient presents with  . Back Pain    HPI [name] is a 67 y.o. male.  HPI   67 year old male with back pain.  Patient has a history of chronic back pain.  States this pain is different though.  Began having pain in his mid back earlier today.  Describes the pain is sharp and intense.  Worse with certain movements.  Has been constant since onset.  No acute numbness, tingling or focal loss of strength.  Denies any abdominal or chest pain.  No respiratory complaints.  Past Medical History:  Diagnosis Date  . Anemia   . Anxiety   . Aortic dissection (HCC)    TYPE 3  . CAD (coronary artery disease)    multiple, most recently Jan-March 2017  . Cardiac tamponade   . Chronic bronchitis ([place])   . Chronic fatigue   . Chronic pain   . Constipation   . COPD (chronic obstructive pulmonary disease) ([place])   . Depression   . Dysphagia, oropharyngeal phase   . ETOH abuse   . GERD (gastroesophageal reflux disease)   . Hiatal hernia   . HTN (hypertension)   . Hypercholesterolemia   . Metabolic encephalopathy   . Opioid abuse ([place])   . Peptic ulcer disease 08/2010   EGD   . PNA (pneumonia)   . Schatzki's ring     Patient Active Problem List   Diagnosis Date Noted  . Chronic respiratory failure with hypoxia ([place]) 05/01/2016  . Dyspnea on exertion 04/30/2016  . Hypokalemia 11/17/2015  . Hyperglycemia 11/17/2015  . COPD exacerbation ([place]) 08/03/2015  . Orthostatic hypotension   . Acute kidney injury ([place])   . Altered mental status 05/11/2015  . AKI (acute kidney injury) ([place]) 05/11/2015  . Decubitus ulcer of back, stage 2 05/11/2015  . GERD without esophagitis 05/11/2015  . Severe protein-calorie malnutrition ([place]) 05/11/2015  . Disorientation 05/11/2015  . Malnutrition of moderate degree 04/04/2015  . Acute  on chronic respiratory failure ([place])   . Aspiration into airway   . Descending thoracic dissection ([place])   . HCAP (healthcare-associated pneumonia)   . Lactic acidosis   . Tylenol toxicity   . Faintness   . NSTEMI (non-ST elevated myocardial infarction) ([place])   . Elevated troponin 03/14/2015  . Acute myocardial infarction, subendocardial infarction, subsequent episode of care ([place]) 10/26/2014  . Angina pectoris ([place]) 10/26/2014  . COPD clinical dx/ could not perforom spirometry  10/26/2014  . Arteriosclerosis of autologous vein coronary artery bypass graft 10/26/2014  . Arteriosclerosis of coronary artery 10/26/2014  . H/O adenomatous polyp of colon 10/26/2014  . Hypercholesterolemia without hypertriglyceridemia 10/26/2014  . Cigarette smoker 10/26/2014  . Depression 04/05/2013  . Cardiac tamponade   . Aortic dissection ([place])   . HTN (hypertension)   . CAD (coronary artery disease)   . Hypercholesterolemia   . Chronic back pain   . Schatzki's ring     Past Surgical History:  Procedure Laterality Date  . BACK SURGERY    . CABG X 6  07/31/2007   [facility]  . CARDIAC CATHETERIZATION    . CARDIAC CATHETERIZATION N/A 03/16/2015   Procedure: Coronary/Graft Angiography;  Surgeon: Sherren Mocha, MD;  Location: [facility];  Service: Cardiovascular;  Laterality: N/A;  . CARDIAC CATHETERIZATION N/A 03/16/2015   Procedure:  Coronary Stent Intervention;  Surgeon: Sherren Mocha, MD;  Location: [facility];  Service: Cardiovascular;  Laterality: N/A;  . KNEE SURGERY Right    acl  . OLECRANON BURSECTOMY Left 08/27/2012   Procedure: EXCISION LEFT OLECRANON BURSA;  Surgeon: Sanjuana Kava, MD;  Location: [facility];  Service: Orthopedics;  Laterality: Left;  . OLECRANON BURSECTOMY Left 05/25/2013   Procedure:  Excision sinus tract and tissue left elbow ;  Surgeon: Sanjuana Kava, MD;  Location: [facility];  Service: Orthopedics;  Laterality: Left;  . ORIF SCAPULAR FRACTURE Right   .  SHOULDER SURGERY Left   . STERNAL WIRE REMOVEAL  12/31/2007   [facility]        Home Medications    Prior to Admission medications   Medication Sig Start Date End Date Taking? Authorizing Provider  acetaminophen (TYLENOL 8 HOUR ARTHRITIS PAIN) 650 MG CR tablet Take 650 mg by mouth every 4 (four) hours as needed for pain.   Yes [provider]  albuterol (PROVENTIL) (2.5 MG/3ML) 0.083% nebulizer solution Take 3 mLs (2.5 mg total) by nebulization every 2 (two) hours as needed for wheezing or shortness of breath. 08/06/15  Yes Isaac Bliss, Rayford Halsted, MD  albuterol (VENTOLIN HFA) 108 (90 Base) MCG/ACT inhaler Inhale 2 puffs into the lungs every 6 (six) hours as needed for wheezing or shortness of breath.   Yes [provider]  aspirin 81 MG chewable tablet Chew 1 tablet (81 mg total) by mouth daily. 04/10/15  Yes [name], MD  atorvastatin (LIPITOR) 20 MG tablet Take 20 mg by mouth daily. 03/17/17  Yes [provider]  budesonide-formoterol (SYMBICORT) 160-4.5 MCG/ACT inhaler Inhale 2 puffs into the lungs 2 (two) times daily. 05/06/16  Yes Tanda Rockers, MD  cyclobenzaprine (FLEXERIL) 10 MG tablet 1 tablet every 8 (eight) hours. 09/28/15  Yes [provider]  feeding supplement, ENSURE ENLIVE, (ENSURE ENLIVE) LIQD Take 237 mLs by mouth 3 (three) times daily between meals. 05/16/15  Yes Barton Dubois, MD  levothyroxine (SYNTHROID, LEVOTHROID) 50 MCG tablet Take 50 mcg by mouth daily. 03/18/17  Yes [provider]  metoprolol tartrate (LOPRESSOR) 25 MG tablet Take 0.5 tablets (12.5 mg total) by mouth 2 (two) times daily. 05/16/15  Yes Barton Dubois, MD  pantoprazole (PROTONIX) 40 MG tablet Take 1 tablet (40 mg total) by mouth at bedtime. 04/10/15  Yes [name], MD  SPIRIVA RESPIMAT 2.5 MCG/ACT AERS Take 1 puff by mouth daily. 03/17/17  Yes [provider]  mirtazapine (REMERON) 15 MG tablet Take 1 tablet (15 mg total) by mouth at  bedtime. Patient not taking: Reported on 04/24/2017 05/16/15   Barton Dubois, MD  morphine (MSIR) 15 MG tablet Take 1 tablet by mouth every 12 (twelve) hours. 09/23/16   [provider]  oxyCODONE (ROXICODONE) 15 MG immediate release tablet Take 1 tablet by mouth every 8 (eight) hours as needed. 09/26/16   [provider]  OXYGEN 3-3.5lpm 24/7    [provider]    Family History Family History  Problem Relation Age of Onset  . Heart disease Father   . Emphysema Paternal Grandmother        never smoked, worked in a [employer]  . Emphysema Paternal Grandfather        never smoked, worked in a [employer]   . Stomach cancer Paternal Grandfather   . Asthma Sister     Social History Social History   Tobacco Use  . Smoking status: Current  Every Day Smoker    Packs/day: 1.00    Years: 20.00    Pack years: 20.00    Types: Cigarettes  . Smokeless tobacco: Former Systems developer    Quit date: 06/05/2015  . Tobacco comment: 04/30/16- smoking 1 cig per day//lmr  Substance Use Topics  . Alcohol use: No    Alcohol/week: 0.0 oz  . Drug use: No     Allergies   Bee venom and Codeine   Review of Systems Review of Systems  All systems reviewed and negative, other than as noted in HPI.  Physical Exam Updated Vital Signs BP 127/81   Pulse 80   Resp 19   Ht 5\' 10"  (1.778 m)   Wt 63.5 kg (140 lb)   SpO2 99%   BMI 20.09 kg/m   Physical Exam  Constitutional: He appears well-developed and well-nourished. No distress.  HENT:  Head: Normocephalic and atraumatic.  Eyes: Conjunctivae are normal. Right eye exhibits no discharge. Left eye exhibits no discharge.  Neck: Neck supple.  Cardiovascular: Normal rate, regular rhythm and normal heart sounds. Exam reveals no gallop and no friction rub.  No murmur heard. Pulmonary/Chest: Effort normal and breath sounds normal. No respiratory distress.  Abdominal: Soft. He exhibits no distension. There is no tenderness.    Musculoskeletal: He exhibits no edema or tenderness.  Back normal to inspection.  There is tenderness to palpation paraspinally on the right in the mid thoracic back.  No overlying concerning skin changes.  No midline spinal tenderness.  Neurological: He is alert.  Skin: Skin is warm and dry.  Psychiatric: He has a normal mood and affect. His behavior is normal. Thought content normal.  Nursing note and vitals reviewed.    ED Treatments / Results  Labs (all labs ordered are listed, but only abnormal results are displayed) Labs Reviewed  CBC WITH DIFFERENTIAL/PLATELET - Abnormal; Notable for the following components:      Result Value   WBC 11.6 (*)    RBC 3.91 (*)    Hemoglobin 12.5 (*)    HCT 37.6 (*)    Neutro Abs 8.8 (*)    All other components within normal limits  BASIC METABOLIC PANEL - Abnormal; Notable for the following components:   CO2 21 (*)    Glucose, Bld 162 (*)    Calcium 8.5 (*)    All other components within normal limits  TROPONIN I - Abnormal; Notable for the following components:   Troponin I 0.04 (*)    All other components within normal limits  TYPE AND SCREEN    EKG EKG Interpretation  Date/Time:  Thursday April 24 2017 11:43:33 EDT Ventricular Rate:  97 PR Interval:    QRS Duration: 94 QT Interval:  345 QTC Calculation: 439 R Axis:   85 Text Interpretation:  Sinus rhythm Ventricular premature complex Borderline right axis deviation Non-specific ST-t changes Confirmed by Virgel Manifold [phone]) on 04/24/2017 12:41:55 PM   Radiology Ct Angio Chest/abd/pel For Dissection W And/or Wo Contrast  Result Date: 04/24/2017 CLINICAL DATA:  Chest and back pain, acute. Aortic dissection suspected. EXAM: CT ANGIOGRAPHY CHEST, ABDOMEN AND PELVIS TECHNIQUE: Multidetector CT imaging through the chest, abdomen and pelvis was performed using the standard protocol during bolus administration of intravenous contrast. Multiplanar reconstructed images and MIPs were  obtained and reviewed to evaluate the vascular anatomy. CONTRAST:  169mL ISOVUE-370 IOPAMIDOL (ISOVUE-370) INJECTION 76% COMPARISON:  04/03/2015 FINDINGS: CTA CHEST FINDINGS Cardiovascular: Aortic dissection beginning beyond the left subclavian origin, type-B. There is  no involvement of the ascending segment or great vessels. The false lumen shows some contrast enhancement proximally, but the large majority of the false lumen is newly thrombosed. The dissection continues into the abdomen, with infra diaphragmatic description above. Aortic dimensions: Valve 26 mm Sinuses of Valsalva 36 mm Sino-tubular junction 27 mm Ascending segment 35 mm Arch 31 mm Proximal descending aorta 48 mm -4.1 cm in 2017 Mid descending aorta 44 mm Aorta at the hiatus 43 mm.  No mediastinal hematoma for rupture. Status post CABG. A LIMA graft is enhancing. There are 3 venous grafts, only one seen to enhance the on the markers. No noted pulmonary embolism. Mediastinum/Nodes: Negative for hematoma Lungs/Pleura: Large lung volumes compatible with COPD. There is no edema, consolidation, effusion, or pneumothorax. Musculoskeletal: No acute or aggressive finding. Review of the MIP images confirms the above findings. CTA ABDOMEN AND PELVIS FINDINGS VASCULAR Aorta: Thoracic dissection continues along the left aspect of the abdominal aorta with persistent false lumen thrombosis. The dissection does not confidently extend beyond the left renal artery. The infrarenal aorta has circumferential low-density more suggestive of low-density thrombus. The infrarenal aorta measures up to 4.5 cm diameter-4 cm in 2017. No visible rupture. Celiac: Thrombosed dissection continues into the celiac axis with dissection flap seen to the level of the bifurcation. 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 No superimposed rupture. No visible embolic phenomenon. SMA: Atheromatous changes at the ostium. No visible occlusion or flow  limiting stenosis. Renals: High-grade narrowing at the left origin. Solitary bilateral renal arteries are seen. IMA: Not visualized Inflow: 50% stenosis at the right common iliac origin. No visible dissection. Veins: No obvious venous abnormality within the limitations of this arterial phase study. Review of the MIP images confirms the above findings. NON-VASCULAR Hepatobiliary: No focal liver abnormality.No evidence of biliary obstruction or stone. Pancreas: Unremarkable. Spleen: Unremarkable. Adrenals/Urinary Tract: Negative adrenals. No evidence of infarct. No asymmetric atrophy. Unremarkable bladder. Stomach/Bowel: Early enhancement of small bowel and stomach mucosa. No detected colonic enhancement, crossing vascular territories and presumably from early phase imaging. No bowel wall thickening for ischemic change. Lymphatic: No mass or adenopathy. Reproductive:No pathologic findings. Other: No ascites or pneumoperitoneum. Musculoskeletal: No acute abnormalities. Critical Value/emergent results were called by telephone at the time of interpretation on 04/24/2017 at 2:30 pm to Dr. Virgel Manifold , who verbally acknowledged these results. Review of the MIP images confirms the above findings. IMPRESSION: 1. Known type B aortic dissection from comparison March 2017 exam (which is being merged for comparison purposes). Except for a small area proximally, the false lumen has thrombosed since prior. 2. Fusiform aneurysmal enlargement of the descending and abdominal aorta with progression since prior. Proximal descending aorta measures up to 4.8 cm, previously 4.1 cm. The infrarenal aorta measures up to 4.5 cm, previously 4.1 cm. 3. 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 Moderate narrowing of the proximal lumen and 9 mm fusiform dilatation of the distal lumen. 4. The dissection appears to terminate at the left renal artery which is chronically stenotic. 5. No definite end-organ ischemia. 6. COPD. 7. Status post  CABG. Good patency of the LIMA graft. Only 1 of 3 saphenous grafts are seen beyond the ostial markers. Electronically Signed   By: Monte Fantasia M.D.   On: 04/24/2017 14:50    Procedures Procedures (including critical care time)  Medications Ordered in ED Medications  fentaNYL (SUBLIMAZE) injection 50 mcg (50 mcg Intravenous Given 04/24/17 1210)  iopamidol (ISOVUE-370) 76 %  injection 100 mL (has no administration in time range)  sodium chloride 0.9 % bolus 1,000 mL (0 mLs Intravenous Stopped 04/24/17 1423)  iopamidol (ISOVUE-370) 76 % injection (100 mLs  Contrast Given 04/24/17 1358)     Initial Impression / Assessment and Plan / ED Course  I have reviewed the triage vital signs and the nursing notes.  Pertinent labs & imaging results that were available during my care of the patient were reviewed by me and considered in my medical decision making (see chart for details).     67 year old male with midthoracic back pain.  Is extremely reproducible on exam.  He has known type B thoracic dissection.  Imaging today shows mild progression from prior imaging just over 2 years ago.  Clinically, I doubt this is the etiology of his symptoms.  His blood pressure is very well controlled.  Imaging was reviewed by Dr. Ricard Dillon, cardiothoracic surgery.  Case is discussed and he feels that it is appropriate for him to follow-up as an outpatient with Dr. Roxan Hockey whom he has seen previously and also recommended that he see vascular surgery as well.  Pattern appears to be chronically mildly elevated.  Very atypical symptoms for ACS.  Final Clinical Impressions(s) / ED Diagnoses   Final diagnoses:  Thoracic back pain, unspecified back pain laterality, unspecified chronicity    ED Discharge Orders    None       Virgel Manifold, MD 04/27/17 [phone]

## 2017-04-24 NOTE — Progress Notes (Signed)
[name], 67 y.o male He is not established at [facility]. The following is for documentation purposes only. Patient arrived with spouse Scot Shiraishi who was scheduled for a routine follow-up appointment today. I was involved with another patient encounter, when my assistant Jeneen Rinks, notifies that the patient was having acute difficulty breathing. Upon entering the room the patient had increased work of breathing. He was immediately placed on 2 L of oxygen O2 sats remained between 93% and 99%.. Auscultated bilateral lung sounds with diffuse wheezing.  Patient was administered a DuoNeb treatment.  He suddenly became diaphoretic began to complain of back pain midline.  He was lethargic during the encounter however never lost consciousness and remained oriented.  EKG was obtained however leads begin to fall off as patient was diaphoretic however EKG did show an acute MI/STEMI however patient has a history of a STEMI in 2016.  Patient was tachycardic with a heart rate accelerating up to 120 bpm.  EMS was called and took over upon arrival.  Patient left with EMS along with wife at bedside.  Patient was transported to [facility] ED.  Patient's chart was only access to obtain information to assist in his care while at [facility].  His spouse did provide verbal consent for access to his medical records and care provided.  Carroll Sage. Kenton Kingfisher, MSN, FNP-C [facility]  [address] [place], [place] [address] [phone]

## 2017-04-25 LAB — TYPE AND SCREEN
ABO/RH(D): A NEG
Antibody Screen: NEGATIVE

## 2017-05-19 ENCOUNTER — Other Ambulatory Visit: Payer: Self-pay | Admitting: Internal Medicine

## 2017-05-19 ENCOUNTER — Telehealth: Payer: Self-pay | Admitting: Internal Medicine

## 2017-05-19 MED ORDER — BUDESONIDE-FORMOTEROL FUMARATE 160-4.5 MCG/ACT IN AERO: 2.0000 | INHALATION_SPRAY | Freq: Two times a day (BID) | RESPIRATORY_TRACT | 0 refills | Status: DC

## 2017-05-19 NOTE — Telephone Encounter (Signed)
Spoke with Kenney Houseman at [facility], advised that we have Symbicort samples but no Spiriva samples.  Samples left up front for pt.  Kenney Houseman will let pt know to pick up samples.  Nothing further needed.

## 2017-05-20 ENCOUNTER — Other Ambulatory Visit: Payer: Self-pay | Admitting: Internal Medicine

## 2017-05-27 ENCOUNTER — Other Ambulatory Visit: Payer: Self-pay

## 2017-05-27 ENCOUNTER — Ambulatory Visit: Payer: Medicare PPO | Admitting: Thoracic Surgery (Cardiothoracic Vascular Surgery)

## 2017-05-27 ENCOUNTER — Encounter: Payer: Self-pay | Admitting: Thoracic Surgery (Cardiothoracic Vascular Surgery)

## 2017-05-27 VITALS — BP 165/94 | HR 77 | Resp 16 | Ht 68.0 in | Wt 145.0 lb

## 2017-05-27 DIAGNOSIS — I7101 Dissection of thoracic aorta: Secondary | ICD-10-CM

## 2017-05-27 DIAGNOSIS — I71012 Dissection of descending thoracic aorta: Secondary | ICD-10-CM

## 2017-05-27 MED ORDER — METOPROLOL TARTRATE 25 MG PO TABS: 25.0000 mg | ORAL_TABLET | Freq: Two times a day (BID) | ORAL | 1 refills | Status: DC

## 2017-05-27 NOTE — Progress Notes (Signed)
PCP is [name], MD Referring Provider is Virgel Manifold, MD  Chief Complaint  Patient presents with  . Follow-up    per ED vs with CTA C/A/P on 04/24/17...felt back pain not attributed to h/o AORTIC DISSECTION    HPI: Mr. Reinders is sent for consultation regarding a type B dissection.  Taeshawn Helfman is a 67 year old man with a past medical history significant for coronary artery disease (status post coronary bypass grafting in 2009) a type III aortic dissection, ethanol abuse, reflux, anxiety, COPD, chronic back pain, depression, and ongoing tobacco abuse.  He was first noted to have a type III aortic dissection back in 2008.  He was followed through 2017 and then lost to follow-up.  He recently was seen in the emergency room with back pain.  A CT of the chest showed a type B dissection with partial thrombosis of the false lumen.  The size was noted to be increased from his most recent scan.  There was no evidence of extension of the dissection or rupture.  His back pain was felt to be musculoskeletal in origin.  Says that he feels fatigued.  He is not having any chest pain, pressure, or tightness.  He does have chronic back pain.  He says he checks his blood pressure.  He says it was 200 this morning.  Past Medical History:  Diagnosis Date  . Anemia   . Anxiety   . Aortic dissection (HCC)    TYPE 3  . CAD (coronary artery disease)    multiple, most recently Jan-March 2017  . Cardiac tamponade   . Chronic bronchitis ([place])   . Chronic fatigue   . Chronic pain   . Constipation   . COPD (chronic obstructive pulmonary disease) ([place])   . Depression   . Dysphagia, oropharyngeal phase   . ETOH abuse   . GERD (gastroesophageal reflux disease)   . Hiatal hernia   . HTN (hypertension)   . Hypercholesterolemia   . Metabolic encephalopathy   . Opioid abuse ([place])   . Peptic ulcer disease 08/2010   EGD   . PNA (pneumonia)   . Schatzki's ring     Past Surgical History:  Procedure  Laterality Date  . BACK SURGERY    . CABG X 6  07/31/2007   [facility]  . CARDIAC CATHETERIZATION    . CARDIAC CATHETERIZATION N/A 03/16/2015   Procedure: Coronary/Graft Angiography;  Surgeon: Sherren Mocha, MD;  Location: [facility];  Service: Cardiovascular;  Laterality: N/A;  . CARDIAC CATHETERIZATION N/A 03/16/2015   Procedure: Coronary Stent Intervention;  Surgeon: Sherren Mocha, MD;  Location: [facility];  Service: Cardiovascular;  Laterality: N/A;  . KNEE SURGERY Right    acl  . OLECRANON BURSECTOMY Left 08/27/2012   Procedure: EXCISION LEFT OLECRANON BURSA;  Surgeon: Sanjuana Kava, MD;  Location: [facility];  Service: Orthopedics;  Laterality: Left;  . OLECRANON BURSECTOMY Left 05/25/2013   Procedure:  Excision sinus tract and tissue left elbow ;  Surgeon: Sanjuana Kava, MD;  Location: [facility];  Service: Orthopedics;  Laterality: Left;  . ORIF SCAPULAR FRACTURE Right   . SHOULDER SURGERY Left   . STERNAL WIRE REMOVEAL  12/31/2007   [facility]    Family History  Problem Relation Age of Onset  . Heart disease Father   . Emphysema Paternal Grandmother        never smoked, worked in a [employer]  . Emphysema Paternal Grandfather  never smoked, worked in a [employer]   . Stomach cancer Paternal Grandfather   . Asthma Sister     Social History Social History   Tobacco Use  . Smoking status: Current Every Day Smoker    Packs/day: 1.00    Years: 20.00    Pack years: 20.00    Types: Cigarettes  . Smokeless tobacco: Former Systems developer    Quit date: 06/05/2015  . Tobacco comment: 04/30/16- smoking 1 cig per day//lmr  Substance Use Topics  . Alcohol use: No    Alcohol/week: 0.0 oz  . Drug use: No    Current Outpatient Medications  Medication Sig Dispense Refill  . acetaminophen (TYLENOL 8 HOUR ARTHRITIS PAIN) 650 MG CR tablet Take 650 mg by mouth every 4 (four) hours as needed for pain.    Marland Kitchen albuterol (PROVENTIL) (2.5 MG/3ML) 0.083% nebulizer solution Take 3  mLs (2.5 mg total) by nebulization every 2 (two) hours as needed for wheezing or shortness of breath. 75 mL 12  . albuterol (VENTOLIN HFA) 108 (90 Base) MCG/ACT inhaler Inhale 2 puffs into the lungs every 6 (six) hours as needed for wheezing or shortness of breath.    Marland Kitchen aspirin 81 MG chewable tablet Chew 1 tablet (81 mg total) by mouth daily. 30 tablet 0  . atorvastatin (LIPITOR) 20 MG tablet Take 20 mg by mouth daily.    . budesonide-formoterol (SYMBICORT) 160-4.5 MCG/ACT inhaler Inhale 2 puffs into the lungs 2 (two) times daily. 2 Inhaler 0  . cyclobenzaprine (FLEXERIL) 10 MG tablet 1 tablet every 8 (eight) hours.    . feeding supplement, ENSURE ENLIVE, (ENSURE ENLIVE) LIQD Take 237 mLs by mouth 3 (three) times daily between meals. 237 mL 12  . levothyroxine (SYNTHROID, LEVOTHROID) 50 MCG tablet Take 50 mcg by mouth daily.    . metoprolol tartrate (LOPRESSOR) 25 MG tablet Take 1 tablet (25 mg total) by mouth 2 (two) times daily. 60 tablet 1  . OXYGEN 3-3.5lpm 24/7    . pantoprazole (PROTONIX) 40 MG tablet Take 1 tablet (40 mg total) by mouth at bedtime. 30 tablet 0  . SPIRIVA RESPIMAT 2.5 MCG/ACT AERS Take 1 puff by mouth daily.    . mirtazapine (REMERON) 15 MG tablet Take 1 tablet (15 mg total) by mouth at bedtime. (Patient not taking: Reported on 04/24/2017) 30 tablet 1   No current facility-administered medications for this visit.    Facility-Administered Medications Ordered in Other Visits  Medication Dose Route Frequency Provider Last Rate Last Dose  . vancomycin (VANCOCIN) 1,500 mg in sodium chloride 0.9 % 500 mL IVPB  1,500 mg Intravenous Once Sanjuana Kava, MD        Allergies  Allergen Reactions  . Bee Venom Swelling    Neck swelling, passes out  . Codeine Other (See Comments)    Causes GI Upset    Review of Systems  Constitutional: Positive for fatigue.  HENT: Positive for trouble swallowing. Negative for voice change.   Eyes: Negative for visual disturbance.  Respiratory:  Negative for chest tightness.   Cardiovascular: Negative for chest pain.  Gastrointestinal: Positive for abdominal pain (Reflux). Negative for abdominal distention.  Genitourinary: Negative for difficulty urinating and dysuria.  Musculoskeletal: Positive for arthralgias and back pain.  Neurological: Negative for seizures, weakness and numbness.  Hematological: Negative for adenopathy. Does not bruise/bleed easily.  All other systems reviewed and are negative.   BP (!) 165/94 (BP Location: Left Arm, Patient Position: Sitting, Cuff Size: Normal)   Pulse 77  Resp 16   Ht 5\' 8"  (1.727 m)   Wt 145 lb (65.8 kg)   SpO2 98% Comment: ON RA  BMI 22.05 kg/m  Physical Exam  Constitutional: He is oriented to person, place, and time. He appears well-developed and well-nourished.  HENT:  Head: Normocephalic and atraumatic.  Eyes: Pupils are equal, round, and reactive to light. EOM are normal. No scleral icterus.  Neck: No thyromegaly present.  Cardiovascular: Normal rate, regular rhythm and normal heart sounds.  No murmur heard. Pulmonary/Chest: Effort normal and breath sounds normal. No stridor. No respiratory distress. He has no wheezes.  Abdominal: Soft. He exhibits no distension. There is no tenderness.  Musculoskeletal: He exhibits deformity (Kyphosis).  Lymphadenopathy:    He has no cervical adenopathy.  Neurological: He is alert and oriented to person, place, and time. No cranial nerve deficit. He exhibits normal muscle tone. Coordination normal.  Skin: Skin is warm and dry.  Vitals reviewed.    Diagnostic Tests: CT ANGIOGRAPHY CHEST, ABDOMEN AND PELVIS  TECHNIQUE: Multidetector CT imaging through the chest, abdomen and pelvis was performed using the standard protocol during bolus administration of intravenous contrast. Multiplanar reconstructed images and MIPs were obtained and reviewed to evaluate the vascular anatomy.  CONTRAST:  144mL ISOVUE-370 IOPAMIDOL (ISOVUE-370)  INJECTION 76%  COMPARISON:  04/03/2015  FINDINGS: CTA CHEST FINDINGS  Cardiovascular: Aortic dissection beginning beyond the left subclavian origin, type-B. There is no involvement of the ascending segment or great vessels. The false lumen shows some contrast enhancement proximally, but the large majority of the false lumen is newly thrombosed. The dissection continues into the abdomen, with infra diaphragmatic description above.  Aortic dimensions:  Valve 26 mm  Sinuses of Valsalva 36 mm  Sino-tubular junction 27 mm  Ascending segment 35 mm  Arch 31 mm  Proximal descending aorta 48 mm -4.1 cm in 2017  Mid descending aorta 44 mm  Aorta at the hiatus 43 mm.  No mediastinal hematoma for rupture.  Status post CABG. A LIMA graft is enhancing. There are 3 venous grafts, only one seen to enhance the on the markers.  No noted pulmonary embolism.  Mediastinum/Nodes: Negative for hematoma  Lungs/Pleura: Large lung volumes compatible with COPD. There is no edema, consolidation, effusion, or pneumothorax.  Musculoskeletal: No acute or aggressive finding.  Review of the MIP images confirms the above findings.  CTA ABDOMEN AND PELVIS FINDINGS  VASCULAR  Aorta: Thoracic dissection continues along the left aspect of the abdominal aorta with persistent false lumen thrombosis. The dissection does not confidently extend beyond the left renal artery. The infrarenal aorta has circumferential low-density more suggestive of low-density thrombus. The infrarenal aorta measures up to 4.5 cm diameter-4 cm in 2017. No visible rupture.  Celiac: Thrombosed dissection continues into the celiac axis with dissection flap seen to the level of the bifurcation. 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 No superimposed rupture. No visible embolic phenomenon.  SMA: Atheromatous changes at the ostium. No visible occlusion  or flow limiting stenosis.  Renals: High-grade narrowing at the left origin. Solitary bilateral renal arteries are seen.  IMA: Not visualized  Inflow: 50% stenosis at the right common iliac origin. No visible dissection.  Veins: No obvious venous abnormality within the limitations of this arterial phase study.  Review of the MIP images confirms the above findings.  NON-VASCULAR  Hepatobiliary: No focal liver abnormality.No evidence of biliary obstruction or stone.  Pancreas: Unremarkable.  Spleen: Unremarkable.  Adrenals/Urinary Tract: Negative  adrenals. No evidence of infarct. No asymmetric atrophy. Unremarkable bladder.  Stomach/Bowel: Early enhancement of small bowel and stomach mucosa. No detected colonic enhancement, crossing vascular territories and presumably from early phase imaging. No bowel wall thickening for ischemic change.  Lymphatic: No mass or adenopathy.  Reproductive:No pathologic findings.  Other: No ascites or pneumoperitoneum.  Musculoskeletal: No acute abnormalities.  Critical Value/emergent results were called by telephone at the time of interpretation on 04/24/2017 at 2:30 pm to Dr. Virgel Manifold , who verbally acknowledged these results.  Review of the MIP images confirms the above findings.  IMPRESSION: 1. Known type B aortic dissection from comparison March 2017 exam (which is being merged for comparison purposes). Except for a small area proximally, the false lumen has thrombosed since prior. 2. Fusiform aneurysmal enlargement of the descending and abdominal aorta with progression since prior. Proximal descending aorta measures up to 4.8 cm, previously 4.1 cm. The infrarenal aorta measures up to 4.5 cm, previously 4.1 cm. 3. 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 Moderate narrowing of the proximal lumen and 9 mm fusiform dilatation of the distal lumen. 4. The dissection appears to terminate at the left  renal artery which is chronically stenotic. 5. No definite end-organ ischemia. 6. COPD. 7. Status post CABG. Good patency of the LIMA graft. Only 1 of 3 saphenous grafts are seen beyond the ostial markers.   Electronically Signed   By: Monte Fantasia M.D.   On: 04/24/2017 14:50 I personally reviewed the CT images.  I agree with the assessment currently.  I do think it was larger on his previous scans and then accurately measured at that time.  I think it was about 4.5 cm in 2017.  Impression: Mr. Gatlin is a 67 year old man with a chronic type III aortic dissection.  His false lumen is partially thrombosed.  He has had this for about 11 years now.  Over that time the proximal descending thoracic aorta has increased in size from 4.1 to 4.8 cm.  In the past 2 years is increased from 4.5 to 4.8 cm.  I do not see an indication for surgery presently but certainly he is approaching the level where that would need to be considered.  He needs 65month follow-up.  He is scheduled to see Dr. Trula Slade in a couple of days.  I encouraged him to keep that appointment.  Tobacco abuse-advised to quit smoking.  He does not seem motivated to do that.  Hypertension-blood pressure elevated 165/97 today.  He says it was over 200 this morning.  I am going to increase his Lopressor to 25 mg twice daily.  He is currently on 12.5 mg twice daily.  He was advised we will keep his blood pressure below 140 at the highest and preferably below 915 systolic.  He is establishing with a new primary care doctor in June  Plan: Consultation with Dr. Trula Slade  Increase metoprolol to 25 mg twice daily  Return with CT angiogram in 6 months.  Melrose Nakayama, MD [facility] [phone]

## 2017-06-09 ENCOUNTER — Encounter: Payer: Medicare PPO | Admitting: Surgery

## 2017-11-10 ENCOUNTER — Encounter

## 2017-11-10 ENCOUNTER — Encounter: Payer: Medicare PPO | Admitting: Surgery

## 2017-11-27 DIAGNOSIS — I10 Essential (primary) hypertension: Secondary | ICD-10-CM | POA: Diagnosis not present

## 2017-11-27 DIAGNOSIS — M48061 Spinal stenosis, lumbar region without neurogenic claudication: Secondary | ICD-10-CM | POA: Diagnosis not present

## 2017-11-27 DIAGNOSIS — E039 Hypothyroidism, unspecified: Secondary | ICD-10-CM | POA: Diagnosis not present

## 2017-11-27 DIAGNOSIS — E7800 Pure hypercholesterolemia, unspecified: Secondary | ICD-10-CM | POA: Diagnosis not present

## 2017-11-27 DIAGNOSIS — J449 Chronic obstructive pulmonary disease, unspecified: Secondary | ICD-10-CM | POA: Diagnosis not present

## 2017-11-27 DIAGNOSIS — K219 Gastro-esophageal reflux disease without esophagitis: Secondary | ICD-10-CM | POA: Diagnosis not present

## 2017-11-27 DIAGNOSIS — I7101 Dissection of thoracic aorta: Secondary | ICD-10-CM | POA: Diagnosis not present

## 2017-11-27 DIAGNOSIS — Z23 Encounter for immunization: Secondary | ICD-10-CM | POA: Diagnosis not present

## 2017-11-27 DIAGNOSIS — F17210 Nicotine dependence, cigarettes, uncomplicated: Secondary | ICD-10-CM | POA: Diagnosis not present

## 2017-12-06 DIAGNOSIS — R269 Unspecified abnormalities of gait and mobility: Secondary | ICD-10-CM | POA: Diagnosis not present

## 2017-12-08 ENCOUNTER — Encounter: Payer: Medicare PPO | Admitting: Surgery

## 2017-12-09 ENCOUNTER — Encounter: Payer: Self-pay | Admitting: Surgery

## 2017-12-31 ENCOUNTER — Emergency Department (HOSPITAL_COMMUNITY): Payer: Medicare PPO

## 2017-12-31 ENCOUNTER — Encounter (HOSPITAL_COMMUNITY): Payer: Self-pay

## 2017-12-31 ENCOUNTER — Other Ambulatory Visit: Payer: Self-pay

## 2017-12-31 ENCOUNTER — Inpatient Hospital Stay (HOSPITAL_COMMUNITY)
Admission: EM | Admit: 2017-12-31 | Discharge: 2018-01-04 | DRG: 190 | Disposition: A | Discharge status: Home Health | Payer: Medicare PPO | Attending: Internal Medicine | Admitting: Internal Medicine

## 2017-12-31 DIAGNOSIS — E039 Hypothyroidism, unspecified: Secondary | ICD-10-CM | POA: Diagnosis present

## 2017-12-31 DIAGNOSIS — E440 Moderate protein-calorie malnutrition: Secondary | ICD-10-CM | POA: Diagnosis not present

## 2017-12-31 DIAGNOSIS — R0902 Hypoxemia: Secondary | ICD-10-CM | POA: Diagnosis not present

## 2017-12-31 DIAGNOSIS — I10 Essential (primary) hypertension: Secondary | ICD-10-CM | POA: Diagnosis present

## 2017-12-31 DIAGNOSIS — R7989 Other specified abnormal findings of blood chemistry: Secondary | ICD-10-CM | POA: Diagnosis not present

## 2017-12-31 DIAGNOSIS — I7101 Dissection of thoracic aorta: Secondary | ICD-10-CM | POA: Diagnosis not present

## 2017-12-31 DIAGNOSIS — Z951 Presence of aortocoronary bypass graft: Secondary | ICD-10-CM

## 2017-12-31 DIAGNOSIS — K219 Gastro-esophageal reflux disease without esophagitis: Secondary | ICD-10-CM | POA: Diagnosis not present

## 2017-12-31 DIAGNOSIS — R6510 Systemic inflammatory response syndrome (SIRS) of non-infectious origin without acute organ dysfunction: Secondary | ICD-10-CM | POA: Diagnosis not present

## 2017-12-31 DIAGNOSIS — R0602 Shortness of breath: Secondary | ICD-10-CM | POA: Diagnosis not present

## 2017-12-31 DIAGNOSIS — J441 Chronic obstructive pulmonary disease with (acute) exacerbation: Secondary | ICD-10-CM | POA: Diagnosis not present

## 2017-12-31 DIAGNOSIS — J9621 Acute and chronic respiratory failure with hypoxia: Secondary | ICD-10-CM | POA: Diagnosis present

## 2017-12-31 DIAGNOSIS — G894 Chronic pain syndrome: Secondary | ICD-10-CM | POA: Diagnosis present

## 2017-12-31 DIAGNOSIS — Z79899 Other long term (current) drug therapy: Secondary | ICD-10-CM | POA: Diagnosis not present

## 2017-12-31 DIAGNOSIS — Z886 Allergy status to analgesic agent status: Secondary | ICD-10-CM

## 2017-12-31 DIAGNOSIS — L89152 Pressure ulcer of sacral region, stage 2: Secondary | ICD-10-CM | POA: Diagnosis present

## 2017-12-31 DIAGNOSIS — I71012 Dissection of descending thoracic aorta: Secondary | ICD-10-CM | POA: Diagnosis present

## 2017-12-31 DIAGNOSIS — F17210 Nicotine dependence, cigarettes, uncomplicated: Secondary | ICD-10-CM | POA: Diagnosis present

## 2017-12-31 DIAGNOSIS — R062 Wheezing: Secondary | ICD-10-CM | POA: Diagnosis not present

## 2017-12-31 DIAGNOSIS — Z6823 Body mass index (BMI) 23.0-23.9, adult: Secondary | ICD-10-CM | POA: Diagnosis not present

## 2017-12-31 DIAGNOSIS — Z7951 Long term (current) use of inhaled steroids: Secondary | ICD-10-CM

## 2017-12-31 DIAGNOSIS — Z885 Allergy status to narcotic agent status: Secondary | ICD-10-CM

## 2017-12-31 DIAGNOSIS — R000 Tachycardia, unspecified: Secondary | ICD-10-CM | POA: Diagnosis not present

## 2017-12-31 DIAGNOSIS — E43 Unspecified severe protein-calorie malnutrition: Secondary | ICD-10-CM

## 2017-12-31 DIAGNOSIS — Z7982 Long term (current) use of aspirin: Secondary | ICD-10-CM | POA: Diagnosis not present

## 2017-12-31 DIAGNOSIS — F418 Other specified anxiety disorders: Secondary | ICD-10-CM | POA: Diagnosis present

## 2017-12-31 DIAGNOSIS — I2510 Atherosclerotic heart disease of native coronary artery without angina pectoris: Secondary | ICD-10-CM | POA: Diagnosis present

## 2017-12-31 DIAGNOSIS — J471 Bronchiectasis with (acute) exacerbation: Principal | ICD-10-CM | POA: Diagnosis present

## 2017-12-31 DIAGNOSIS — E7800 Pure hypercholesterolemia, unspecified: Secondary | ICD-10-CM | POA: Diagnosis present

## 2017-12-31 DIAGNOSIS — F5105 Insomnia due to other mental disorder: Secondary | ICD-10-CM

## 2017-12-31 DIAGNOSIS — R778 Other specified abnormalities of plasma proteins: Secondary | ICD-10-CM | POA: Diagnosis present

## 2017-12-31 DIAGNOSIS — R0689 Other abnormalities of breathing: Secondary | ICD-10-CM | POA: Diagnosis not present

## 2017-12-31 LAB — COMPREHENSIVE METABOLIC PANEL
ALT: 11 U/L (ref 0–44)
AST: 23 U/L (ref 15–41)
Albumin: 3.3 g/dL — ABNORMAL LOW (ref 3.5–5.0)
Alkaline Phosphatase: 52 U/L (ref 38–126)
Anion gap: 11 (ref 5–15)
BUN: 11 mg/dL (ref 8–23)
CO2: 24 mmol/L (ref 22–32)
Calcium: 7.9 mg/dL — ABNORMAL LOW (ref 8.9–10.3)
Chloride: 97 mmol/L — ABNORMAL LOW (ref 98–111)
Creatinine, Ser: 0.8 mg/dL (ref 0.61–1.24)
GFR calc Af Amer: >60 mL/min (ref >60)
GFR calc non Af Amer: >60 mL/min (ref >60)
Glucose, Bld: 119 mg/dL — ABNORMAL HIGH (ref 70–99)
Potassium: 3.5 mmol/L (ref 3.5–5.1)
Sodium: 132 mmol/L — ABNORMAL LOW (ref 135–145)
Total Bilirubin: 0.6 mg/dL (ref 0.3–1.2)
Total Protein: 7.1 g/dL (ref 6.5–8.1)

## 2017-12-31 LAB — CBC WITH DIFFERENTIAL/PLATELET
Abs Immature Granulocytes: 0.1 10*3/uL — ABNORMAL HIGH (ref 0.00–0.07)
Basophils Absolute: 0 10*3/uL (ref 0.0–0.1)
Basophils Relative: 0 %
Eosinophils Absolute: 0 10*3/uL (ref 0.0–0.5)
Eosinophils Relative: 0 %
HCT: 38.6 % — ABNORMAL LOW (ref 39.0–52.0)
Hemoglobin: 12.5 g/dL — ABNORMAL LOW (ref 13.0–17.0)
Immature Granulocytes: 1 %
Lymphocytes Relative: 12 %
Lymphs Abs: 1.1 10*3/uL (ref 0.7–4.0)
MCH: 31.8 pg (ref 26.0–34.0)
MCHC: 32.4 g/dL (ref 30.0–36.0)
MCV: 98.2 fL (ref 80.0–100.0)
Monocytes Absolute: 0.6 10*3/uL (ref 0.1–1.0)
Monocytes Relative: 6 %
Neutro Abs: 7.6 10*3/uL (ref 1.7–7.7)
Neutrophils Relative %: 81 %
Platelets: 127 10*3/uL — ABNORMAL LOW (ref 150–400)
RBC: 3.93 MIL/uL — ABNORMAL LOW (ref 4.22–5.81)
RDW: 13.5 % (ref 11.5–15.5)
WBC: 9.4 10*3/uL (ref 4.0–10.5)
nRBC: 0 % (ref 0.0–0.2)

## 2017-12-31 LAB — URINALYSIS, ROUTINE W REFLEX MICROSCOPIC
Bilirubin Urine: NEGATIVE
Glucose, UA: 250 mg/dL — AB
Ketones, ur: NEGATIVE mg/dL
Leukocytes, UA: NEGATIVE
Nitrite: NEGATIVE
Protein, ur: 100 mg/dL — AB
Specific Gravity, Urine: 1.025 (ref 1.005–1.030)
pH: 5 (ref 5.0–8.0)

## 2017-12-31 LAB — I-STAT CG4 LACTIC ACID, ED: LACTIC ACID, VENOUS: 1.15 mmol/L (ref 0.5–1.9)

## 2017-12-31 LAB — URINALYSIS, MICROSCOPIC (REFLEX)

## 2017-12-31 LAB — PROTIME-INR
INR: 0.99
Prothrombin Time: 13 seconds (ref 11.4–15.2)

## 2017-12-31 LAB — TROPONIN I
Troponin I: 0.06 ng/mL (ref <0.03)
Troponin I: 0.06 ng/mL (ref <0.03)

## 2017-12-31 LAB — INFLUENZA PANEL BY PCR (TYPE A & B)
Influenza A By PCR: NEGATIVE
Influenza B By PCR: NEGATIVE

## 2017-12-31 MED ORDER — DIPHENHYDRAMINE HCL 25 MG PO CAPS
25.0000 mg | ORAL_CAPSULE | Freq: Three times a day (TID) | ORAL | Status: DC | PRN
  Administered 2017-12-31 – 2018-01-01 (×2): 25 mg via ORAL
  Filled 2017-12-31 (×2): qty 1

## 2017-12-31 MED ORDER — BUDESONIDE 0.5 MG/2ML IN SUSP
0.5000 mg | Freq: Two times a day (BID) | RESPIRATORY_TRACT | Status: DC
  Administered 2017-12-31 – 2018-01-04 (×8): 0.5 mg via RESPIRATORY_TRACT
  Filled 2017-12-31 (×9): qty 2

## 2017-12-31 MED ORDER — SODIUM CHLORIDE 0.9 % IV BOLUS (SEPSIS): 1000.0000 mL | Freq: Once | INTRAVENOUS | Status: DC

## 2017-12-31 MED ORDER — PANTOPRAZOLE SODIUM 40 MG PO TBEC
40.0000 mg | DELAYED_RELEASE_TABLET | Freq: Every day | ORAL | Status: DC
  Administered 2018-01-01 – 2018-01-03 (×3): 40 mg via ORAL
  Filled 2017-12-31 (×3): qty 1

## 2017-12-31 MED ORDER — LEVOTHYROXINE SODIUM 50 MCG PO TABS
50.0000 ug | ORAL_TABLET | Freq: Every day | ORAL | Status: DC
  Administered 2018-01-01 – 2018-01-04 (×4): 50 ug via ORAL
  Filled 2017-12-31 (×4): qty 1

## 2017-12-31 MED ORDER — ARFORMOTEROL TARTRATE 15 MCG/2ML IN NEBU
15.0000 ug | INHALATION_SOLUTION | Freq: Two times a day (BID) | RESPIRATORY_TRACT | Status: DC
  Administered 2017-12-31 – 2018-01-04 (×8): 15 ug via RESPIRATORY_TRACT
  Filled 2017-12-31 (×9): qty 2

## 2017-12-31 MED ORDER — BENZONATATE 100 MG PO CAPS
200.0000 mg | ORAL_CAPSULE | Freq: Three times a day (TID) | ORAL | Status: DC | PRN
  Administered 2017-12-31 – 2018-01-02 (×4): 200 mg via ORAL
  Filled 2017-12-31 (×4): qty 2

## 2017-12-31 MED ORDER — ALBUTEROL (5 MG/ML) CONTINUOUS INHALATION SOLN
7.5000 mg/h | INHALATION_SOLUTION | Freq: Once | RESPIRATORY_TRACT | Status: AC
Start: 2017-12-31 — End: 2017-12-31
  Administered 2017-12-31: 7.5 mg/h via RESPIRATORY_TRACT
  Filled 2017-12-31: qty 20

## 2017-12-31 MED ORDER — SODIUM CHLORIDE 0.9 % IV SOLN
1.0000 g | Freq: Once | INTRAVENOUS | Status: AC
  Administered 2017-12-31: 1 g via INTRAVENOUS
  Filled 2017-12-31: qty 10

## 2017-12-31 MED ORDER — SODIUM CHLORIDE 0.9 % IV SOLN
INTRAVENOUS | Status: AC
  Administered 2017-12-31: 20:00:00 via INTRAVENOUS

## 2017-12-31 MED ORDER — TRAMADOL HCL 50 MG PO TABS
100.0000 mg | ORAL_TABLET | Freq: Once | ORAL | Status: AC
  Administered 2017-12-31: 100 mg via ORAL
  Filled 2017-12-31: qty 2

## 2017-12-31 MED ORDER — METHYLPREDNISOLONE SODIUM SUCC 125 MG IJ SOLR
60.0000 mg | Freq: Three times a day (TID) | INTRAMUSCULAR | Status: DC
  Administered 2017-12-31 – 2018-01-01 (×4): 60 mg via INTRAVENOUS
  Filled 2017-12-31 (×4): qty 2

## 2017-12-31 MED ORDER — NICOTINE 21 MG/24HR TD PT24
21.0000 mg | MEDICATED_PATCH | Freq: Every day | TRANSDERMAL | Status: DC
  Administered 2017-12-31 – 2018-01-04 (×5): 21 mg via TRANSDERMAL
  Filled 2017-12-31 (×5): qty 1

## 2017-12-31 MED ORDER — SODIUM CHLORIDE 0.9 % IV BOLUS (SEPSIS)
250.0000 mL | Freq: Once | INTRAVENOUS | Status: AC
  Administered 2017-12-31: 250 mL via INTRAVENOUS

## 2017-12-31 MED ORDER — CYCLOBENZAPRINE HCL 10 MG PO TABS: 10.0000 mg | ORAL_TABLET | Freq: Three times a day (TID) | ORAL | Status: DC | PRN

## 2017-12-31 MED ORDER — ASPIRIN 81 MG PO CHEW
81.0000 mg | CHEWABLE_TABLET | Freq: Every day | ORAL | Status: DC
  Administered 2018-01-01 – 2018-01-04 (×4): 81 mg via ORAL
  Filled 2017-12-31 (×4): qty 1

## 2017-12-31 MED ORDER — ALBUTEROL SULFATE (2.5 MG/3ML) 0.083% IN NEBU: 2.5000 mg | INHALATION_SOLUTION | RESPIRATORY_TRACT | Status: DC | PRN

## 2017-12-31 MED ORDER — SODIUM CHLORIDE 0.9 % IV SOLN
INTRAVENOUS | Status: DC | PRN
  Administered 2017-12-31: 250 mL via INTRAVENOUS

## 2017-12-31 MED ORDER — ENSURE ENLIVE PO LIQD
237.0000 mL | Freq: Two times a day (BID) | ORAL | Status: DC
  Administered 2018-01-01 – 2018-01-03 (×5): 237 mL via ORAL

## 2017-12-31 MED ORDER — OXYCODONE-ACETAMINOPHEN 5-325 MG PO TABS
1.0000 | ORAL_TABLET | Freq: Three times a day (TID) | ORAL | Status: DC | PRN
  Administered 2017-12-31 – 2018-01-03 (×7): 1 via ORAL
  Filled 2017-12-31 (×7): qty 1

## 2017-12-31 MED ORDER — ATORVASTATIN CALCIUM 20 MG PO TABS
20.0000 mg | ORAL_TABLET | Freq: Every day | ORAL | Status: DC
  Administered 2017-12-31 – 2018-01-03 (×4): 20 mg via ORAL
  Filled 2017-12-31 (×4): qty 1

## 2017-12-31 MED ORDER — MAGNESIUM SULFATE 2 GM/50ML IV SOLN
2.0000 g | Freq: Once | INTRAVENOUS | Status: AC
  Administered 2017-12-31: 2 g via INTRAVENOUS
  Filled 2017-12-31: qty 50

## 2017-12-31 MED ORDER — AZITHROMYCIN 250 MG PO TABS
500.0000 mg | ORAL_TABLET | Freq: Once | ORAL | Status: AC
  Administered 2017-12-31: 500 mg via ORAL
  Filled 2017-12-31: qty 2

## 2017-12-31 MED ORDER — MIRTAZAPINE 15 MG PO TABS
15.0000 mg | ORAL_TABLET | Freq: Every day | ORAL | Status: DC
  Administered 2017-12-31 – 2018-01-01 (×2): 15 mg via ORAL
  Filled 2017-12-31 (×2): qty 1

## 2017-12-31 MED ORDER — IPRATROPIUM-ALBUTEROL 0.5-2.5 (3) MG/3ML IN SOLN
3.0000 mL | Freq: Four times a day (QID) | RESPIRATORY_TRACT | Status: DC
  Administered 2017-12-31 – 2018-01-04 (×15): 3 mL via RESPIRATORY_TRACT
  Filled 2017-12-31 (×15): qty 3

## 2017-12-31 MED ORDER — METOPROLOL TARTRATE 25 MG PO TABS
25.0000 mg | ORAL_TABLET | Freq: Two times a day (BID) | ORAL | Status: DC
  Administered 2017-12-31 – 2018-01-04 (×8): 25 mg via ORAL
  Filled 2017-12-31 (×8): qty 1

## 2017-12-31 MED ORDER — HEPARIN SODIUM (PORCINE) 5000 UNIT/ML IJ SOLN
5000.0000 [IU] | Freq: Three times a day (TID) | INTRAMUSCULAR | Status: DC
  Administered 2018-01-01 – 2018-01-04 (×7): 5000 [IU] via SUBCUTANEOUS
  Filled 2017-12-31 (×8): qty 1

## 2017-12-31 MED ORDER — ACETAMINOPHEN 325 MG PO TABS
650.0000 mg | ORAL_TABLET | Freq: Once | ORAL | Status: AC
  Administered 2017-12-31: 650 mg via ORAL
  Filled 2017-12-31: qty 2

## 2017-12-31 MED ORDER — SODIUM CHLORIDE 0.9 % IV BOLUS (SEPSIS)
1000.0000 mL | Freq: Once | INTRAVENOUS | Status: AC
  Administered 2017-12-31: 1000 mL via INTRAVENOUS

## 2017-12-31 MED ORDER — LEVOFLOXACIN IN D5W 750 MG/150ML IV SOLN
750.0000 mg | INTRAVENOUS | Status: DC
  Administered 2018-01-01: 750 mg via INTRAVENOUS
  Filled 2017-12-31: qty 150

## 2017-12-31 MED ORDER — GABAPENTIN 300 MG PO CAPS
300.0000 mg | ORAL_CAPSULE | Freq: Three times a day (TID) | ORAL | Status: DC
  Administered 2017-12-31 – 2018-01-04 (×12): 300 mg via ORAL
  Filled 2017-12-31 (×9): qty 1
  Filled 2017-12-31 (×2): qty 3

## 2017-12-31 MED ORDER — ACETAMINOPHEN 325 MG PO TABS
650.0000 mg | ORAL_TABLET | Freq: Once | ORAL | Status: AC | PRN
  Administered 2018-01-01: 650 mg via ORAL
  Filled 2017-12-31: qty 2

## 2017-12-31 NOTE — Progress Notes (Signed)
Pt performed peak flow at this time- 150lpm

## 2017-12-31 NOTE — H&P (Addendum)
History and Physical    [name] XNT:[id number] DOB: [birth year] DOA: 12/31/2017  Referring MD/NP/PA: Dr. Melina Copa PCP: Shelbie Ammons, MD  Patient coming from: Home  Chief Complaint: Shortness of breath, increased wheezing and productive cough.  HPI: [name] is a 67 y.o. male with a past medical history significant for depression/anxiety, aortic dissection type III, COPD with chronic bronchitis and chronic respiratory failure using 2.5-3 L nasal cannula supplementation at baseline; presented to the emergency department with worsening shortness of breath, productive cough and increased wheezing.  Patient expressed that his symptoms have been present for over a week now and have failed to improve.  He has had some upper respiratory infection symptoms and express no sick contacts.  Patient cough initially was nonproductive but has now moved to have some increased yellowish/white phlegm.  He reports some intermittent discomfort when coughing in his costal area.  Patient expressed that despite the use of inhaler treatments at home he continued to be short of breath.  She denies headache, blurred vision, focal weakness, hemoptysis, nausea, vomiting, dysuria, hematuria, melena, hematochezia or any other complaints.  In the ED patient was found to be unable to speak in full sentences, tachypneic and with oxygen saturation of 88 despite the use of 3 L.  He required to be placed between 5-7 L to maintain his O2 sats above 90%.  He was found to be febrile at 103 T-max and with mild tachycardia.  Chest x-ray demonstrated bronchitic changes but no definitive infiltrates.  Lactic acid within normal limits.  Patient had no acute ischemic changes on EKG but has a mild elevation in his troponin (0.06).  Patient receive DuoNeb, steroids, cultures were taken and influenza panel check.  He was started on antibiotics.  TRH has been called on the patient for further evaluation and management of acute on chronic  respiratory failure in the setting of COPD exacerbation.  Past Medical/Surgical History: Past Medical History:  Diagnosis Date  . Anemia   . Anxiety   . Aortic dissection (HCC)    TYPE 3  . CAD (coronary artery disease)    multiple, most recently Jan-March 2017  . Cardiac tamponade   . Chronic bronchitis ([place])   . Chronic fatigue   . Chronic pain   . Constipation   . COPD (chronic obstructive pulmonary disease) ([place])   . Depression   . Dysphagia, oropharyngeal phase   . ETOH abuse   . GERD (gastroesophageal reflux disease)   . Hiatal hernia   . HTN (hypertension)   . Hypercholesterolemia   . Metabolic encephalopathy   . Opioid abuse ([place])   . Peptic ulcer disease 08/2010   EGD   . PNA (pneumonia)   . Schatzki's ring     Past Surgical History:  Procedure Laterality Date  . BACK SURGERY    . CABG X 6  07/31/2007   [facility]  . CARDIAC CATHETERIZATION    . CARDIAC CATHETERIZATION N/A 03/16/2015   Procedure: Coronary/Graft Angiography;  Surgeon: Sherren Mocha, MD;  Location: [name];  Service: Cardiovascular;  Laterality: N/A;  . CARDIAC CATHETERIZATION N/A 03/16/2015   Procedure: Coronary Stent Intervention;  Surgeon: Sherren Mocha, MD;  Location: [facility];  Service: Cardiovascular;  Laterality: N/A;  . KNEE SURGERY Right    acl  . OLECRANON BURSECTOMY Left 08/27/2012   Procedure: EXCISION LEFT OLECRANON BURSA;  Surgeon: Sanjuana Kava, MD;  Location: [facility];  Service: Orthopedics;  Laterality: Left;  . OLECRANON  BURSECTOMY Left 05/25/2013   Procedure:  Excision sinus tract and tissue left elbow ;  Surgeon: Sanjuana Kava, MD;  Location: [facility];  Service: Orthopedics;  Laterality: Left;  . ORIF SCAPULAR FRACTURE Right   . SHOULDER SURGERY Left   . STERNAL WIRE REMOVEAL  12/31/2007   [facility]    Social History:  reports that he has been smoking cigarettes. He has a 20.00 pack-year smoking history. He quit smokeless tobacco use about 2 years ago. He  reports that he does not drink alcohol or use drugs.  Allergies: Allergies  Allergen Reactions  . Nsaids Shortness Of Breath  . Bee Venom Swelling    Neck swelling, passes out  . Codeine Other (See Comments)    Causes GI Upset    Family History:  Family History  Problem Relation Age of Onset  . Heart disease Father   . Emphysema Paternal Grandmother        never smoked, worked in a [employer]  . Emphysema Paternal Grandfather        never smoked, worked in a [employer]   . Stomach cancer Paternal Grandfather   . Asthma Sister     Prior to Admission medications   Medication Sig Start Date End Date Taking? Authorizing Provider  albuterol (PROVENTIL) (2.5 MG/3ML) 0.083% nebulizer solution Take 3 mLs (2.5 mg total) by nebulization every 2 (two) hours as needed for wheezing or shortness of breath. 08/06/15  Yes Isaac Bliss, Rayford Halsted, MD  albuterol (VENTOLIN HFA) 108 (90 Base) MCG/ACT inhaler Inhale 2 puffs into the lungs every 6 (six) hours as needed for wheezing or shortness of breath.   Yes [provider]  aspirin 81 MG chewable tablet Chew 1 tablet (81 mg total) by mouth daily. 04/10/15  Yes [name], MD  atorvastatin (LIPITOR) 20 MG tablet Take 20 mg by mouth daily. 03/17/17  Yes [provider]  cyclobenzaprine (FLEXERIL) 10 MG tablet Take 10 mg by mouth 3 (three) times daily as needed for muscle spasms.  09/28/15  Yes [provider]  Fluticasone-Umeclidin-Vilant (TRELEGY ELLIPTA) 100-62.5-25 MCG/INH AEPB Inhale 1 puff into the lungs daily.   Yes [provider]  levothyroxine (SYNTHROID, LEVOTHROID) 50 MCG tablet Take 50 mcg by mouth daily. 03/18/17  Yes [provider]  metoprolol tartrate (LOPRESSOR) 25 MG tablet Take 1 tablet (25 mg total) by mouth 2 (two) times daily. 05/27/17  Yes Melrose Nakayama, MD  oxyCODONE-acetaminophen (PERCOCET/ROXICET) 5-325 MG tablet Take 1 tablet by mouth daily as needed for severe pain.   Yes  [provider]  OXYGEN Inhale 3.5 L into the lungs daily as needed (for shortness of breath). 3-3.5lpm 24/7    Yes [provider]  pantoprazole (PROTONIX) 40 MG tablet Take 1 tablet (40 mg total) by mouth at bedtime. 04/10/15  Yes [name], MD  traMADol (ULTRAM) 50 MG tablet Take 50 mg by mouth every 6 (six) hours as needed for moderate pain.   Yes [provider]  gabapentin (NEURONTIN) 300 MG capsule Take 300 mg by mouth 3 (three) times daily. 12/20/17   [provider]    Review of Systems:  Negative except as otherwise mentioned in HPI.    Physical Exam: Vitals:   12/31/17 1230 12/31/17 1247 12/31/17 1340 12/31/17 1834  BP: (!) 146/80   (!) 145/96  Pulse: (!) 102   94  Resp:    16  Temp:   99.4 F (37.4 C) 97.8 F (36.6 C)  TempSrc:   Oral Oral  SpO2: 93% 98%  96%  Weight:      Height:       Constitutional: Mild distress secondary to shortness of breath and ongoing productive coughing spells.  Patient unable to speak in full sentences.  No nausea, no vomiting. Eyes: PERRL, lids and conjunctivae normal, no icterus, no nystagmus ENMT: Mucous membranes are moist. Posterior pharynx clear of any exudate or lesions. Neck: normal, supple, no masses, no thyromegaly, no JVD. Respiratory: Poor air movement bilaterally, diffuse expiratory wheezing, positive rhonchi.  Tachypneic with minimal exertion, no using accessory muscles.  Patient is requiring 4-5 L nasal cannula supplementation.   Cardiovascular: Sinus tachycardia, no murmurs / rubs / gallops. No extremity edema. 2+ pedal pulses. No carotid bruits.  Abdomen: no tenderness, no masses palpated. No hepatosplenomegaly. Bowel sounds positive.  Musculoskeletal: no clubbing / cyanosis. No joint deformity upper and lower extremities. Good ROM, no contractures. Normal muscle tone.  Skin: No open wounds; patient with multiple scratches and scabs appreciated on his left upper extremity. No  induration Neurologic: CN 2-12 grossly intact. Sensation intact, DTR normal. Strength 5/5 in all 4.  Psychiatric: Normal judgment and insight. Alert and oriented x 3. Normal mood.    Labs on Admission: I have personally reviewed the following labs and imaging studies  CBC: Recent Labs  Lab 12/31/17 1208  WBC 9.4  NEUTROABS 7.6  HGB 12.5*  HCT 38.6*  MCV 98.2  PLT 546*   Basic Metabolic Panel: Recent Labs  Lab 12/31/17 1208  NA 132*  K 3.5  CL 97*  CO2 24  GLUCOSE 119*  BUN 11  CREATININE 0.80  CALCIUM 7.9*   GFR: Estimated Creatinine Clearance: 93.8 mL/min (by C-G formula based on SCr of 0.8 mg/dL).   Liver Function Tests: Recent Labs  Lab 12/31/17 1208  AST 23  ALT 11  ALKPHOS 52  BILITOT 0.6  PROT 7.1  ALBUMIN 3.3*   Coagulation Profile: Recent Labs  Lab 12/31/17 1208  INR 0.99   Cardiac Enzymes: Recent Labs  Lab 12/31/17 1211  TROPONINI 0.06*   Urine analysis:    Component Value Date/Time   COLORURINE YELLOW 05/11/2015 1612   APPEARANCEUR CLEAR 05/11/2015 1612   LABSPEC 1.013 05/11/2015 1612   PHURINE 5.0 05/11/2015 1612   GLUCOSEU NEGATIVE 05/11/2015 1612   HGBUR NEGATIVE 05/11/2015 1612   BILIRUBINUR NEGATIVE 05/11/2015 1612   KETONESUR NEGATIVE 05/11/2015 1612   PROTEINUR NEGATIVE 05/11/2015 1612   UROBILINOGEN 0.2 08/26/2012 0930   NITRITE NEGATIVE 05/11/2015 1612   LEUKOCYTESUR NEGATIVE 05/11/2015 1612    Recent Results (from the past 240 hour(s))  Culture, blood (Routine x 2)     Status: None (Preliminary result)   Collection Time: 12/31/17 12:08 PM  Result Value Ref Range Status   Specimen Description BLOOD LEFT FOREARM  Final   Special Requests   Final    BOTTLES DRAWN AEROBIC AND ANAEROBIC Blood Culture results may not be optimal due to an excessive volume of blood received in culture bottles   Culture   Final    NO GROWTH <12 HOURS Performed at [facility], [address], [place], [place] [address]    Report Status  PENDING  Incomplete  Culture, blood (Routine x 2)     Status: None (Preliminary result)   Collection Time: 12/31/17 12:14 PM  Result Value Ref Range Status   Specimen Description BLOOD LEFT HAND  Final   Special Requests   Final    BOTTLES  DRAWN AEROBIC AND ANAEROBIC Blood Culture results may not be optimal due to an inadequate volume of blood received in culture bottles   Culture   Final    NO GROWTH <12 HOURS Performed at [facility], [address], [place], [place] [address]    Report Status PENDING  Incomplete     Radiological Exams on Admission: Dg Chest Port 1 View  Result Date: 12/31/2017 CLINICAL DATA:  Shortness of breath EXAM: PORTABLE CHEST 1 VIEW COMPARISON:  March 17, 2017 FINDINGS: Lungs are somewhat hyperexpanded. There is chronic blunting of the left costophrenic angle. There is no frank edema or consolidation. Heart size and pulmonary vascularity are normal. No adenopathy. Aorta is mildly prominent but stable. No adenopathy. There is evidence of old trauma involving each lateral clavicle. IMPRESSION: Lungs hyperexpanded with chronic blunting of the left costophrenic angle. No edema or consolidation. Stable cardiac silhouette. Aortic prominence is likely indicative of chronic hypertensive change. Electronically Signed   By: Lowella Grip III M.D.   On: 12/31/2017 12:39    EKG: Independently reviewed.  Sinus tachycardia, normal QT; right axis deviation appreciated.  No acute ischemic changes.  Assessment/Plan 1-Acute on chronic respiratory failure with hypoxia ([place]): In the setting of COPD exacerbation and bronchiectasis. -Patient with moderate exacerbation symptoms, positive difficulty speaking in full sentences, increased oxygen supplementation needs and also having tachypnea and high-grade temperature. -Started on IV steroids, DuoNeb, PRN albuterol, Brovana and Pulmicort -IV Levaquin with intention to treat for 5 days -Flutter valve and as needed Tessalon  [place] -Fluid resuscitation we will given over the next 15 hours to further stabilize his condition and will use as needed antipyretics -Continue oxygen supplementation and titrate down to his 2.5-3 L nasal cannula supplementation chronically used at home.  2-HTN (hypertension) -Stable -Will resume home antihypertensive regimen (using metoprolol)  3-Hypercholesterolemia -Continue statins  4-Elevated troponin -Most likely in the setting of COPD exacerbation with mild demand ischemia -Patient denies left-sided chest discomfort, orthopnea or palpitations -Will monitor on telemetry for 24 hours, cycle EKG and troponin -Continue treatment as mentioned above for COPD -Will continue home metoprolol, statins and aspirin.  5-Descending thoracic dissection ([place]) -Hemodynamically stable -Continue outpatient follow-up.  6-GERD without esophagitis -Continue PPI  7-Moderate protein-calorie malnutrition ([place]) -Started on Ensure twice a day -Will follow nutritional service recommendation for further feeding supplements.  8-Insomnia secondary to depression with anxiety -Resume the use of Remeron  9-tobacco abuse -I have discussed tobacco cessation with the patient.  I have counseled the patient regarding the negative impacts of continued tobacco use including but not limited to lung cancer, COPD, and cardiovascular disease.  I have discussed alternatives to tobacco and modalities that may help facilitate tobacco cessation including but not limited to biofeedback, hypnosis, and medications.  Total time spent with tobacco counseling was 5 minutes -Nicotine patch ordered.  10-hypothyroidism -Will continue the use of Synthroid.  11-SIRS: In the setting of COPD exacerbation and bronchiectasis -No sepsis have been ruled in at this moment -Will continue treatment as mentioned above using antibiotic for bronchiectasis and providing support and treatment for his COPD. -Follow clinical  response. -Continue as needed antipyretics.  DVT prophylaxis: Heparin Code Status: Full code Family Communication: No family at bedside Disposition Plan: To be determined; hopefully home once breathing stabilized. Consults called: None Admission status: Patient, length of state more than 2 midnights; telemetry bed.  It is my clinical impression that Mr. Wakeley will require more than 2 midnights hospitalization according to stabilize his respiratory status  and condition to be able to complete outpatient therapy.  He ended experiencing SIRS in the setting of acute on chronic respiratory failure due to COPD exacerbation and bronchiectasis.  He is having difficulty speaking in full sentences and is requiring higher level of oxygen supplementation though he normally wears at home.    Patient will require IV steroids, IV antibiotics, nebulizer treatments and ongoing antitussives to control his exacerbation symptoms.   Time Spent: 70 minutes  Barton Dubois MD [facility] Pager [phone]  If 7PM-7AM, please contact night-coverage [email or web address] Password [email or web address]  12/31/2017, 7:06 PM

## 2017-12-31 NOTE — ED Notes (Signed)
CRITICAL VALUE ALERT  Critical Value:  Troponin 0.06  Date & Time Notied:  12-31-17 1307  Provider Notified: Butler,MD  Orders Received/Actions taken: continue to monitor

## 2017-12-31 NOTE — ED Triage Notes (Signed)
Ems reports was called out for SOB.  Reports o2 sat on room air was in the 70's.  Reports has home o2 prn.  Pt c/o worsening sob today and pain in r ribs and back.  EMS gave duoneb and 125mg  solumedrol PTA.

## 2017-12-31 NOTE — ED Notes (Signed)
Pt was informed that we need a urine sample. 

## 2017-12-31 NOTE — ED Provider Notes (Signed)
[facility] EMERGENCY DEPARTMENT Provider Note   CSN: [id number] Arrival date & time: 12/31/17  1150     History   Chief Complaint Chief Complaint  Patient presents with  . Shortness of Breath    HPI [name] is a 67 y.o. male.  67 year old male with CAD aortic dissection COPD here with increased shortness of breath for about a week.  He is on 3-1/2 L chronic O2.  Per EMS his sats were in the 70s on arrival.  His home oxygen has been working.  He has been coughing nonproductive and his ribs are sore.  He did not realize he was febrile.  He had a few episodes of vomiting no abdominal pain no urinary symptoms.  He has chronic back pain he says his back's been hurting him more.  No headache no runny nose no sore throat.  He is tried his inhalers without any improvement.  The history is provided by the patient.  Shortness of Breath  This is a new problem. The average episode lasts 1 week. The problem occurs intermittently.The problem has been gradually worsening. Associated symptoms include cough, wheezing, chest pain and vomiting. Pertinent negatives include no fever, no headaches, no rhinorrhea, no sore throat, no sputum production, no hemoptysis, no syncope, no abdominal pain, no rash, no leg pain and no leg swelling. It is unknown what precipitated the problem. Risk factors include smoking. He has tried beta-agonist inhalers for the symptoms. The treatment provided no relief. He has had prior hospitalizations. He has had prior ED visits. Associated medical issues include COPD, CAD and past MI.    Past Medical History:  Diagnosis Date  . Anemia   . Anxiety   . Aortic dissection (HCC)    TYPE 3  . CAD (coronary artery disease)    multiple, most recently Jan-March 2017  . Cardiac tamponade   . Chronic bronchitis ([place])   . Chronic fatigue   . Chronic pain   . Constipation   . COPD (chronic obstructive pulmonary disease) ([place])   . Depression   . Dysphagia, oropharyngeal phase     . ETOH abuse   . GERD (gastroesophageal reflux disease)   . Hiatal hernia   . HTN (hypertension)   . Hypercholesterolemia   . Metabolic encephalopathy   . Opioid abuse ([place])   . Peptic ulcer disease 08/2010   EGD   . PNA (pneumonia)   . Schatzki's ring     Patient Active Problem List   Diagnosis Date Noted  . Chronic respiratory failure with hypoxia ([place]) 05/01/2016  . Dyspnea on exertion 04/30/2016  . Hypokalemia 11/17/2015  . Hyperglycemia 11/17/2015  . COPD exacerbation ([place]) 08/03/2015  . Orthostatic hypotension   . Acute kidney injury ([place])   . Altered mental status 05/11/2015  . AKI (acute kidney injury) ([place]) 05/11/2015  . Decubitus ulcer of back, stage 2 ([place]) 05/11/2015  . GERD without esophagitis 05/11/2015  . Severe protein-calorie malnutrition ([place]) 05/11/2015  . Disorientation 05/11/2015  . Malnutrition of moderate degree 04/04/2015  . Acute on chronic respiratory failure ([place])   . Aspiration into airway   . Descending thoracic dissection ([place])   . HCAP (healthcare-associated pneumonia)   . Lactic acidosis   . Tylenol toxicity   . Faintness   . NSTEMI (non-ST elevated myocardial infarction) ([place])   . Elevated troponin 03/14/2015  . Acute myocardial infarction, subendocardial infarction, subsequent episode of care ([place]) 10/26/2014  . Angina pectoris ([place]) 10/26/2014  . COPD clinical dx/  could not perforom spirometry  10/26/2014  . Arteriosclerosis of autologous vein coronary artery bypass graft 10/26/2014  . Arteriosclerosis of coronary artery 10/26/2014  . H/O adenomatous polyp of colon 10/26/2014  . Hypercholesterolemia without hypertriglyceridemia 10/26/2014  . Cigarette smoker 10/26/2014  . Depression 04/05/2013  . Cardiac tamponade   . Aortic dissection ([place])   . HTN (hypertension)   . CAD (coronary artery disease)   . Hypercholesterolemia   . Chronic back pain   . Schatzki's ring     Past Surgical History:  Procedure Laterality Date  . BACK  SURGERY    . CABG X 6  07/31/2007   [facility]  . CARDIAC CATHETERIZATION    . CARDIAC CATHETERIZATION N/A 03/16/2015   Procedure: Coronary/Graft Angiography;  Surgeon: Sherren Mocha, MD;  Location: [facility];  Service: Cardiovascular;  Laterality: N/A;  . CARDIAC CATHETERIZATION N/A 03/16/2015   Procedure: Coronary Stent Intervention;  Surgeon: Sherren Mocha, MD;  Location: [facility];  Service: Cardiovascular;  Laterality: N/A;  . KNEE SURGERY Right    acl  . OLECRANON BURSECTOMY Left 08/27/2012   Procedure: EXCISION LEFT OLECRANON BURSA;  Surgeon: Sanjuana Kava, MD;  Location: [facility];  Service: Orthopedics;  Laterality: Left;  . OLECRANON BURSECTOMY Left 05/25/2013   Procedure:  Excision sinus tract and tissue left elbow ;  Surgeon: Sanjuana Kava, MD;  Location: [facility];  Service: Orthopedics;  Laterality: Left;  . ORIF SCAPULAR FRACTURE Right   . SHOULDER SURGERY Left   . STERNAL WIRE REMOVEAL  12/31/2007   [facility]        Home Medications    Prior to Admission medications   Medication Sig Start Date End Date Taking? Authorizing Provider  acetaminophen (TYLENOL 8 HOUR ARTHRITIS PAIN) 650 MG CR tablet Take 650 mg by mouth every 4 (four) hours as needed for pain.    [provider]  albuterol (PROVENTIL) (2.5 MG/3ML) 0.083% nebulizer solution Take 3 mLs (2.5 mg total) by nebulization every 2 (two) hours as needed for wheezing or shortness of breath. 08/06/15   Isaac Bliss, Rayford Halsted, MD  albuterol (VENTOLIN HFA) 108 (90 Base) MCG/ACT inhaler Inhale 2 puffs into the lungs every 6 (six) hours as needed for wheezing or shortness of breath.    [provider]  aspirin 81 MG chewable tablet Chew 1 tablet (81 mg total) by mouth daily. 04/10/15   [name], MD  atorvastatin (LIPITOR) 20 MG tablet Take 20 mg by mouth daily. 03/17/17   [provider]  budesonide-formoterol (SYMBICORT) 160-4.5 MCG/ACT inhaler Inhale 2 puffs into the lungs 2  (two) times daily. 05/19/17   Tanda Rockers, MD  cyclobenzaprine (FLEXERIL) 10 MG tablet 1 tablet every 8 (eight) hours. 09/28/15   [provider]  feeding supplement, ENSURE ENLIVE, (ENSURE ENLIVE) LIQD Take 237 mLs by mouth 3 (three) times daily between meals. 05/16/15   Barton Dubois, MD  levothyroxine (SYNTHROID, LEVOTHROID) 50 MCG tablet Take 50 mcg by mouth daily. 03/18/17   [provider]  metoprolol tartrate (LOPRESSOR) 25 MG tablet Take 1 tablet (25 mg total) by mouth 2 (two) times daily. 05/27/17   Melrose Nakayama, MD  mirtazapine (REMERON) 15 MG tablet Take 1 tablet (15 mg total) by mouth at bedtime. Patient not taking: Reported on 04/24/2017 05/16/15   Barton Dubois, MD  OXYGEN 3-3.5lpm 24/7    [provider]  pantoprazole (PROTONIX) 40 MG tablet Take 1 tablet (40 mg total) by mouth at bedtime. 04/10/15  [name], MD  SPIRIVA RESPIMAT 2.5 MCG/ACT AERS Take 1 puff by mouth daily. 03/17/17   [provider]    Family History Family History  Problem Relation Age of Onset  . Heart disease Father   . Emphysema Paternal Grandmother        never smoked, worked in a [employer]  . Emphysema Paternal Grandfather        never smoked, worked in a [employer]   . Stomach cancer Paternal Grandfather   . Asthma Sister     Social History Social History   Tobacco Use  . Smoking status: Current Every Day Smoker    Packs/day: 1.00    Years: 20.00    Pack years: 20.00    Types: Cigarettes  . Smokeless tobacco: Former Systems developer    Quit date: 06/05/2015  . Tobacco comment: 04/30/16- smoking 1 cig per day//lmr  Substance Use Topics  . Alcohol use: No    Alcohol/week: 0.0 standard drinks    Comment: denies  . Drug use: No     Allergies   Bee venom; Codeine; and Nsaids   Review of Systems Review of Systems  Constitutional: Negative for fever.  HENT: Negative for rhinorrhea and sore throat.   Eyes: Negative for visual disturbance.    Respiratory: Positive for cough, shortness of breath and wheezing. Negative for hemoptysis and sputum production.   Cardiovascular: Positive for chest pain. Negative for leg swelling and syncope.  Gastrointestinal: Positive for vomiting. Negative for abdominal pain.  Genitourinary: Negative for dysuria.  Musculoskeletal: Positive for back pain.  Skin: Negative for rash.  Neurological: Negative for headaches.     Physical Exam Updated Vital Signs BP (!) 171/108 (BP Location: Left Arm)   Pulse (!) 106   Temp (!) 103 F (39.4 C) (Oral)   Resp (!) 22   Ht 5\' 10"  (1.778 m)   Wt 74.8 kg   SpO2 94%   BMI 23.68 kg/m   Physical Exam  Constitutional: He appears well-developed and well-nourished.  HENT:  Head: Normocephalic and atraumatic.  Eyes: Conjunctivae and EOM are normal.  Neck: Neck supple. No tracheal deviation present.  Cardiovascular: Normal rate, regular rhythm and normal heart sounds.  No murmur heard. Pulmonary/Chest: Accessory muscle usage present. Tachypnea noted. No respiratory distress. He has wheezes.  Abdominal: Soft. He exhibits no mass. There is no tenderness. There is no guarding.  Musculoskeletal: Normal range of motion. He exhibits no edema.       Right lower leg: He exhibits no tenderness and no edema.       Left lower leg: He exhibits no tenderness and no edema.  Neurological: He is alert.  Skin: Skin is warm and dry. Capillary refill takes less than 2 seconds.  Psychiatric: He has a normal mood and affect.  Nursing note and vitals reviewed.    ED Treatments / Results  Labs (all labs ordered are listed, but only abnormal results are displayed) Labs Reviewed  COMPREHENSIVE METABOLIC PANEL - Abnormal; Notable for the following components:      Result Value   Sodium 132 (*)    Chloride 97 (*)    Glucose, Bld 119 (*)    Calcium 7.9 (*)    Albumin 3.3 (*)    All other components within normal limits  CBC WITH DIFFERENTIAL/PLATELET - Abnormal;  Notable for the following components:   RBC 3.93 (*)    Hemoglobin 12.5 (*)    HCT 38.6 (*)    Platelets 127 (*)  Abs Immature Granulocytes 0.10 (*)    All other components within normal limits  TROPONIN I - Abnormal; Notable for the following components:   Troponin I 0.06 (*)    All other components within normal limits  URINALYSIS, ROUTINE W REFLEX MICROSCOPIC - Abnormal; Notable for the following components:   Glucose, UA 250 (*)    Hgb urine dipstick MODERATE (*)    Protein, ur 100 (*)    All other components within normal limits  TROPONIN I - Abnormal; Notable for the following components:   Troponin I 0.06 (*)    All other components within normal limits  URINALYSIS, MICROSCOPIC (REFLEX) - Abnormal; Notable for the following components:   Bacteria, UA RARE (*)    All other components within normal limits  CULTURE, BLOOD (ROUTINE X 2)  CULTURE, BLOOD (ROUTINE X 2)  EXPECTORATED SPUTUM ASSESSMENT W REFEX TO RESP CULTURE  PROTIME-INR  INFLUENZA PANEL BY PCR (TYPE A & B)  TROPONIN I  TROPONIN I  HIV ANTIBODY (ROUTINE TESTING W REFLEX)  BASIC METABOLIC PANEL  CBC  I-STAT CG4 LACTIC ACID, ED    EKG EKG Interpretation  Date/Time:  Wednesday December 31 2017 13:32:13 EST Ventricular Rate:  107 PR Interval:    QRS Duration: 89 QT Interval:  348 QTC Calculation: 465 R Axis:   94 Text Interpretation:  Sinus tachycardia Multiple ventricular premature complexes Probable left atrial enlargement Right axis deviation Borderline T abnormalities, lateral leads similar to prior 4/19 Confirmed by Aletta Edouard [phone]) on 12/31/2017 1:34:42 PM   Radiology Dg Chest Port 1 View  Result Date: 12/31/2017 CLINICAL DATA:  Shortness of breath EXAM: PORTABLE CHEST 1 VIEW COMPARISON:  March 17, 2017 FINDINGS: Lungs are somewhat hyperexpanded. There is chronic blunting of the left costophrenic angle. There is no frank edema or consolidation. Heart size and pulmonary vascularity are  normal. No adenopathy. Aorta is mildly prominent but stable. No adenopathy. There is evidence of old trauma involving each lateral clavicle. IMPRESSION: Lungs hyperexpanded with chronic blunting of the left costophrenic angle. No edema or consolidation. Stable cardiac silhouette. Aortic prominence is likely indicative of chronic hypertensive change. Electronically Signed   By: Lowella Grip III M.D.   On: 12/31/2017 12:39    Procedures .Critical Care Performed by: Hayden Rasmussen, MD Authorized by: Hayden Rasmussen, MD   Critical care provider statement:    Critical care time (minutes):  45   Critical care time was exclusive of:  Separately billable procedures and treating other patients   Critical care was necessary to treat or prevent imminent or life-threatening deterioration of the following conditions:  Respiratory failure (SIRS)   Critical care was time spent personally by me on the following activities:  Discussions with consultants, evaluation of patient's response to treatment, examination of patient, ordering and performing treatments and interventions, ordering and review of laboratory studies, ordering and review of radiographic studies, pulse oximetry, re-evaluation of patient's condition, obtaining history from patient or surrogate, review of old charts and development of treatment plan with patient or surrogate   I assumed direction of critical care for this patient from another provider in my specialty: no     (including critical care time)  Medications Ordered in ED Medications  albuterol (PROVENTIL,VENTOLIN) solution continuous neb (has no administration in time range)  magnesium sulfate IVPB 2 g 50 mL (has no administration in time range)  acetaminophen (TYLENOL) tablet 650 mg (has no administration in time range)  sodium chloride 0.9 % bolus 1,000  mL (has no administration in time range)    And  sodium chloride 0.9 % bolus 1,000 mL (has no administration in time  range)    And  sodium chloride 0.9 % bolus 250 mL (has no administration in time range)  acetaminophen (TYLENOL) tablet 650 mg (has no administration in time range)     Initial Impression / Assessment and Plan / ED Course  I have reviewed the triage vital signs and the nursing notes.  Pertinent labs & imaging results that were available during my care of the patient were reviewed by me and considered in my medical decision making (see chart for details).  Clinical Course as of Jan 01 2155  Wed Dec 31, 2017  1355 Patient's lab work shows his lactate to be in the normal range.  His white blood cell count is also normal.  His troponin slightly elevated 0.06.  His EKG was nonspecific with some PVCs.  He has blood cultures drawn and have started him empirically on ceftriaxone and Zithromax.  His chest x-ray was read as COPD and no obvious infiltrates though.  Temp is improved to 99 4.  He states he feels very nervous after the breathing treatment and he usually takes tramadol and is asking for a dose of that.   [MB]  1356 Due to his increased respiratory distress and his hypoxia patient will need to be admitted to the hospital.  He is in agreement with this.  Hospitalist has been paged.   [MB]  63 Gust with Dr. Dyann Kief from hospital service who will evaluate the patient for admission.   [MB]    Clinical Course User Index [MB] Hayden Rasmussen, MD     Final Clinical Impressions(s) / ED Diagnoses   Final diagnoses:  COPD exacerbation ([place])  SIRS (systemic inflammatory response syndrome) [facility])  Hypoxia    ED Discharge Orders    None       Hayden Rasmussen, MD 12/31/17 2157

## 2018-01-01 DIAGNOSIS — E440 Moderate protein-calorie malnutrition: Secondary | ICD-10-CM

## 2018-01-01 LAB — CBC
HCT: 36.2 % — ABNORMAL LOW (ref 39.0–52.0)
Hemoglobin: 11.4 g/dL — ABNORMAL LOW (ref 13.0–17.0)
MCH: 31 pg (ref 26.0–34.0)
MCHC: 31.5 g/dL (ref 30.0–36.0)
MCV: 98.4 fL (ref 80.0–100.0)
NRBC: 0 % (ref 0.0–0.2)
Platelets: 127 10*3/uL — ABNORMAL LOW (ref 150–400)
RBC: 3.68 MIL/uL — ABNORMAL LOW (ref 4.22–5.81)
RDW: 13.3 % (ref 11.5–15.5)
WBC: 9.7 10*3/uL (ref 4.0–10.5)

## 2018-01-01 LAB — BASIC METABOLIC PANEL
Anion gap: 8 (ref 5–15)
BUN: 13 mg/dL (ref 8–23)
CHLORIDE: 100 mmol/L (ref 98–111)
CO2: 25 mmol/L (ref 22–32)
Calcium: 7.7 mg/dL — ABNORMAL LOW (ref 8.9–10.3)
Creatinine, Ser: 0.83 mg/dL (ref 0.61–1.24)
GFR calc Af Amer: >60 mL/min (ref >60)
GFR calc non Af Amer: >60 mL/min (ref >60)
Glucose, Bld: 201 mg/dL — ABNORMAL HIGH (ref 70–99)
Potassium: 3.7 mmol/L (ref 3.5–5.1)
Sodium: 133 mmol/L — ABNORMAL LOW (ref 135–145)

## 2018-01-01 LAB — TROPONIN I
Troponin I: 0.06 ng/mL (ref <0.03)
Troponin I: 0.06 ng/mL (ref <0.03)

## 2018-01-01 MED ORDER — DM-GUAIFENESIN ER 30-600 MG PO TB12
1.0000 | ORAL_TABLET | Freq: Two times a day (BID) | ORAL | Status: DC
  Administered 2018-01-01 – 2018-01-04 (×6): 1 via ORAL
  Filled 2018-01-01 (×6): qty 1

## 2018-01-01 MED ORDER — HYDROXYZINE HCL 50 MG/ML IM SOLN
25.0000 mg | Freq: Four times a day (QID) | INTRAMUSCULAR | Status: DC | PRN
  Administered 2018-01-01: 25 mg via INTRAMUSCULAR
  Filled 2018-01-01: qty 1

## 2018-01-01 MED ORDER — ACETYLCYSTEINE 10% NICU INHALATION SOLUTION
3.0000 mL | Freq: Four times a day (QID) | RESPIRATORY_TRACT | Status: DC
  Filled 2018-01-01 (×16): qty 3

## 2018-01-01 MED ORDER — ACETYLCYSTEINE 20 % IN SOLN
3.0000 mL | Freq: Four times a day (QID) | RESPIRATORY_TRACT | Status: DC
  Administered 2018-01-01 – 2018-01-03 (×6): 3 mL via RESPIRATORY_TRACT
  Filled 2018-01-01 (×6): qty 4

## 2018-01-01 NOTE — Progress Notes (Signed)
Initial Nutrition Assessment  DOCUMENTATION CODES:   Non-severe (moderate) malnutrition in context of acute illness/injury  INTERVENTION:  Add gravy to foods to add moisture   Ensure Enlive po BID, each supplement provides 350 kcal and 20 grams of protein    NUTRITION DIAGNOSIS:   Moderate Malnutrition related to acute illness, chronic illness, decreased appetite(acute on chronic respiratory illness) as evidenced by energy intake < 75% for > 7 days, mild fat depletion, mild muscle depletion.   GOAL:   Patient will meet greater than or equal to 90% of their needs  MONITOR:   PO intake, Supplement acceptance  REASON FOR ASSESSMENT:   Consult Assessment of nutrition requirement/status  ASSESSMENT: patient is from home. He has a hx of COPD-chronic bronchitis (home O2),CAD, HTN, GERD, Dysphagia, Schatzki's ring, Opioid and ETOH abuse. He presents with shortness of breath which has been persisting the past week. He remains short of breath and it is difficult for him to answer details of nutrition history without coughing.  focused of interview more toward yes / no simple questions.  Patient likes cold cereal at breakfast, sometimes eats a bologna sandwich for lunch and eats chicken, fish or salmon with mashed potatoes, green beans or pintos mostly at dinner. Beverages: water, coke, lemonade. Patient complains of constant dry mouth and sips on some type beverage all day to help manage this.   Reviewed weight history shows gain of 20 lbs (9 kg) /13% increase in weight over the past 7 months. Patient says he has been working on adding additional foods/ONS to boost daily intake. He likes Ensure or simular product and has been consuming 1-2 daily.   Medications reviewed and include: Ensure Enlive , Neurontin, Remeron, Protonix  Labs: hyponatremia BMP Latest Ref Rng & Units 01/01/2018 12/31/2017 04/24/2017  Glucose 70 - 99 mg/dL 201(H) 119(H) 162(H)  BUN 8 - 23 mg/dL 13 11 16   Creatinine  0.61 - 1.24 mg/dL 0.83 0.80 1.03  Sodium 135 - 145 mmol/L 133(L) 132(L) 138  Potassium 3.5 - 5.1 mmol/L 3.7 3.5 3.7  Chloride 98 - 111 mmol/L 100 97(L) 103  CO2 22 - 32 mmol/L 25 24 21(L)  Calcium 8.9 - 10.3 mg/dL 7.7(L) 7.9(L) 8.5(L)      NUTRITION - FOCUSED PHYSICAL EXAM:    Most Recent Value  Orbital Region  Mild depletion  Upper Arm Region  Mild depletion  Temple Region  Mild depletion  Clavicle Bone Region  Mild depletion  Clavicle and Acromion Bone Region  Moderate depletion  Dorsal Hand  No depletion  Hair  Reviewed  Eyes  Reviewed  Skin  Reviewed      Diet Order:   Diet Order            Diet Heart Room service appropriate? Yes; Fluid consistency: Thin  Diet effective now              EDUCATION NEEDS:   Education needs have been addressed Skin:  Skin Assessment: Reviewed RN Assessment  Last BM:  12/11   Height:   Ht Readings from Last 1 Encounters:  12/31/17 5\' 10"  (1.778 m)    Weight:   Wt Readings from Last 1 Encounters:  12/31/17 74.8 kg    Ideal Body Weight:  75 kg  BMI:  Body mass index is 23.68 kg/m.  Estimated Nutritional Needs:   Kcal:  2250-2475 (30-33 kcal/kg/bw)  Protein:  105-120 gr (1.4-1.6 gr/kg/bw)  Fluid:  per MD goals    Colman Cater MS,RD,CSG,LDN Office: [phone]  Pager: [phone]

## 2018-01-01 NOTE — Progress Notes (Signed)
OT Cancellation Note  Patient Details Name: [name] MRN: [id number] DOB: 1950-12-28   Cancelled Treatment:    Reason Eval/Treat Not Completed: OT screened, no needs identified, will sign off. Pt is at baseline for ADL completion, primarily limited due to SOB. Pt is familiar with energy conservation strategies and employs daily. No further OT services required at this time. Thank you for this referral.   Guadelupe Sabin, OTR/L  [phone] 01/01/2018, 8:36 AM

## 2018-01-01 NOTE — Progress Notes (Signed)
PT Cancellation Note  Patient Details Name: [name] MRN: [id number] DOB: 1950/09/20   Cancelled Treatment:    Reason Eval/Treat Not Completed: Medical issues which prohibited therapy.  Patient presents seated at bedside and unable to participate with therapy due to difficulty breathing - RN present in room and aware.  Will check back tomorrow.   3:25 PM, 01/01/18 Lonell Grandchild, MPT Physical Therapist with [facility] [phone] office [phone] mobile phone

## 2018-01-01 NOTE — Progress Notes (Signed)
PROGRESS NOTE    [name]  PXT:[id number] DOB: 1950/07/06 DOA: 12/31/2017 PCP: Shelbie Ammons, MD     Brief Narrative:  67 y.o. male with a past medical history significant for depression/anxiety, aortic dissection type III, COPD with chronic bronchitis and chronic respiratory failure using 2.5-3 L nasal cannula supplementation at baseline; presented to the emergency department with worsening shortness of breath, productive cough and increased wheezing.  Patient expressed that his symptoms have been present for over a week now and have failed to improve.  He has had some upper respiratory infection symptoms and express no sick contacts.  Patient cough initially was nonproductive but has now moved to have some increased yellowish/white phlegm.  He reports some intermittent discomfort when coughing in his costal area.  Patient expressed that despite the use of inhaler treatments at home he continued to be short of breath.  She denies headache, blurred vision, focal weakness, hemoptysis, nausea, vomiting, dysuria, hematuria, melena, hematochezia or any other complaints.  In the ED patient was found to be unable to speak in full sentences, tachypneic and with oxygen saturation of 88 despite the use of 3 L.  He required to be placed between 5-7 L to maintain his O2 sats above 90%.   Assessment & Plan: 1-acute on chronic respiratory failure with hypoxia: In the setting of COPD exacerbation and bronchiectasis -Continue to have moderate exacerbation symptoms with difficulty speaking in full sentences and increased oxygen supplementation needs. -Has remained afebrile -Still with diffuse expiratory wheezing. -Continue IV steroids, DuoNeb, PRN albuterol, continue Brovana and Pulmicort -Continue IV Levaquin and flutter valve -Patient will be started on Mucinex every 12 hours -Continue supportive care and try to wean oxygen supplementation as tolerated. -Continue PRN antitussives and  antipyretics. -SIRS presentation resolved.  2-hypertension -Has remained stable -Continue metoprolol -Follow vital signs  3-hyperlipidemia -Continue statins  4-elevated troponin -Most likely in the setting of mild demand ischemia with ongoing COPD exacerbation -EKGs has remained without acute ischemic changes -Patient denies chest pain -Troponin with flat elevation suggesting no ischemic process -Continue metoprolol, statins and aspirin -Discontinue telemetry.  5-gastroesophageal reflux disease without esophagitis -Continue PPI  6-descending thoracic dissection -Continue outpatient follow-up -Patient has remained hemodynamically stable -No chest pain.  7-moderate protein calorie malnutrition -Continue Ensure twice a day -Appreciate nutritional service evaluation and recommendations  8-tobacco abuse -Extensive cessation counseling has been provided on 12/31/2016 -Continue nicotine patch  9-insomnia secondary to depression with anxiety -Continue the use of Remeron nightly.  10-Hypothyroidism -Continue Synthroid  DVT prophylaxis: Heparin Code Status: Full code Family Communication: No family at bedside Disposition Plan: Remains inpatient, continue steroids, continue nebulizer treatment, start the use of Mucinex, continue flutter valve and continue supportive care.  Consultants:   None  Procedures:   See below for x-ray reports.  Antimicrobials:  Anti-infectives (From admission, onward)   Start     Dose/Rate Route Frequency Ordered Stop   01/01/18 1200  levofloxacin (LEVAQUIN) IVPB 750 mg     750 mg 100 mL/hr over 90 Minutes Intravenous Every 24 hours 12/31/17 1906     12/31/17 1315  cefTRIAXone (ROCEPHIN) 1 g in sodium chloride 0.9 % 100 mL IVPB     1 g 200 mL/hr over 30 Minutes Intravenous  Once 12/31/17 1301 12/31/17 1345   12/31/17 1315  azithromycin (ZITHROMAX) tablet 500 mg     500 mg Oral  Once 12/31/17 1301 12/31/17 1311       Subjective: No  fever.  Reports shortness of  breath with minimal exertion and is still experiencing weight coughing spells with hard time expectorating.  No chest pain.  Requiring higher level of oxygen supplementation no he normally uses at home.  Objective: Vitals:   01/01/18 0847 01/01/18 0853 01/01/18 1326 01/01/18 1454  BP:   (!) 159/114   Pulse:   83   Resp:   (!) 21   Temp:   98 F (36.7 C)   TempSrc:   Oral   SpO2: 99% 100% 99% 95%  Weight:      Height:        Intake/Output Summary (Last 24 hours) at 01/01/2018 1614 Last data filed at 01/01/2018 1512 Gross per 24 hour  Intake 1527.54 ml  Output 2050 ml  Net -522.46 ml   Filed Weights   12/31/17 1152  Weight: 74.8 kg    Examination: General exam: Alert, awake, oriented x 3; still having difficulty speaking in full sentences; very short of breath with minimal exertion and requiring 4 L Loop oxygen supplementation.  Clinically appears to be frail and underweight. Respiratory system: Fair air movement, diffuse expiratory wheezing, positive rhonchi right, no using accessory muscles.  Very tachypneic with minimal exertion. Cardiovascular system: RRR. No murmurs, rubs, gallops. Gastrointestinal system: Abdomen is nondistended, soft and nontender. No organomegaly or masses felt. Normal bowel sounds heard. Central nervous system: Alert and oriented. No focal neurological deficits. Extremities: No C/C/E, +pedal pulses Skin: No rashes, lesions or ulcers Psychiatry: Judgement and insight appear normal. Mood & affect appropriate.     Data Reviewed: I have personally reviewed following labs and imaging studies  CBC: Recent Labs  Lab 12/31/17 1208 01/01/18 0200  WBC 9.4 9.7  NEUTROABS 7.6  --   HGB 12.5* 11.4*  HCT 38.6* 36.2*  MCV 98.2 98.4  PLT 127* 619*   Basic Metabolic Panel: Recent Labs  Lab 12/31/17 1208 01/01/18 0200  NA 132* 133*  K 3.5 3.7  CL 97* 100  CO2 24 25  GLUCOSE 119* 201*  BUN 11 13  CREATININE 0.80 0.83    CALCIUM 7.9* 7.7*   GFR: Estimated Creatinine Clearance: 90.4 mL/min (by C-G formula based on SCr of 0.83 mg/dL).   Liver Function Tests: Recent Labs  Lab 12/31/17 1208  AST 23  ALT 11  ALKPHOS 52  BILITOT 0.6  PROT 7.1  ALBUMIN 3.3*   Coagulation Profile: Recent Labs  Lab 12/31/17 1208  INR 0.99   Cardiac Enzymes: Recent Labs  Lab 12/31/17 1211 12/31/17 2002 01/01/18 0200 01/01/18 0827  TROPONINI 0.06* 0.06* 0.06* 0.06*   Urine analysis:    Component Value Date/Time   COLORURINE YELLOW 12/31/2017 2047   APPEARANCEUR CLEAR 12/31/2017 2047   LABSPEC 1.025 12/31/2017 2047   PHURINE 5.0 12/31/2017 2047   GLUCOSEU 250 (A) 12/31/2017 2047   HGBUR MODERATE (A) 12/31/2017 2047   BILIRUBINUR NEGATIVE 12/31/2017 2047   KETONESUR NEGATIVE 12/31/2017 2047   PROTEINUR 100 (A) 12/31/2017 2047   UROBILINOGEN 0.2 08/26/2012 0930   NITRITE NEGATIVE 12/31/2017 2047   LEUKOCYTESUR NEGATIVE 12/31/2017 2047    Recent Results (from the past 240 hour(s))  Culture, blood (Routine x 2)     Status: None (Preliminary result)   Collection Time: 12/31/17 12:08 PM  Result Value Ref Range Status   Specimen Description BLOOD LEFT FOREARM  Final   Special Requests   Final    BOTTLES DRAWN AEROBIC AND ANAEROBIC Blood Culture results may not be optimal due to an excessive volume of blood received in  culture bottles   Culture   Final    NO GROWTH < 24 HOURS Performed at [facility], [address], [place], [place] [address]    Report Status PENDING  Incomplete  Culture, blood (Routine x 2)     Status: None (Preliminary result)   Collection Time: 12/31/17 12:14 PM  Result Value Ref Range Status   Specimen Description BLOOD LEFT HAND  Final   Special Requests   Final    BOTTLES DRAWN AEROBIC AND ANAEROBIC Blood Culture results may not be optimal due to an inadequate volume of blood received in culture bottles   Culture   Final    NO GROWTH < 24 HOURS Performed at [facility], [address] [place], [place] [address]    Report Status PENDING  Incomplete     Radiology Studies: Dg Chest Port 1 View  Result Date: 12/31/2017 CLINICAL DATA:  Shortness of breath EXAM: PORTABLE CHEST 1 VIEW COMPARISON:  March 17, 2017 FINDINGS: Lungs are somewhat hyperexpanded. There is chronic blunting of the left costophrenic angle. There is no frank edema or consolidation. Heart size and pulmonary vascularity are normal. No adenopathy. Aorta is mildly prominent but stable. No adenopathy. There is evidence of old trauma involving each lateral clavicle. IMPRESSION: Lungs hyperexpanded with chronic blunting of the left costophrenic angle. No edema or consolidation. Stable cardiac silhouette. Aortic prominence is likely indicative of chronic hypertensive change. Electronically Signed   By: Lowella Grip III M.D.   On: 12/31/2017 12:39    Scheduled Meds: . arformoterol  15 mcg Nebulization BID  . aspirin  81 mg Oral Daily  . atorvastatin  20 mg Oral q1800  . budesonide (PULMICORT) nebulizer solution  0.5 mg Nebulization BID  . dextromethorphan-guaiFENesin  1 tablet Oral BID  . feeding supplement (ENSURE ENLIVE)  237 mL Oral BID BM  . gabapentin  300 mg Oral TID  . heparin injection (subcutaneous)  5,000 Units Subcutaneous Q8H  . ipratropium-albuterol  3 mL Nebulization Q6H  . levothyroxine  50 mcg Oral Q0600  . methylPREDNISolone (SOLU-MEDROL) injection  60 mg Intravenous Q8H  . metoprolol tartrate  25 mg Oral BID  . mirtazapine  15 mg Oral QHS  . nicotine  21 mg Transdermal Daily  . pantoprazole  40 mg Oral QHS   Continuous Infusions: . sodium chloride 250 mL (12/31/17 1240)  . levofloxacin (LEVAQUIN) IV 100 mL/hr at 01/01/18 1512     LOS: 1 day    Time spent: 30 minutes.    Barton Dubois, MD [facility] Pager [phone]  If 7PM-7AM, please contact night-coverage [email or web address] Password [facility] 01/01/2018, 4:14 PM

## 2018-01-02 LAB — HIV ANTIBODY (ROUTINE TESTING W REFLEX): HIV Screen 4th Generation wRfx: NONREACTIVE

## 2018-01-02 MED ORDER — HYDROXYZINE HCL 25 MG PO TABS
25.0000 mg | ORAL_TABLET | Freq: Four times a day (QID) | ORAL | Status: DC | PRN
  Administered 2018-01-02 – 2018-01-03 (×3): 25 mg via ORAL
  Filled 2018-01-02 (×3): qty 1

## 2018-01-02 MED ORDER — TRAZODONE HCL 50 MG PO TABS
100.0000 mg | ORAL_TABLET | Freq: Every evening | ORAL | Status: DC | PRN
  Administered 2018-01-02 – 2018-01-03 (×2): 100 mg via ORAL
  Filled 2018-01-02 (×2): qty 2

## 2018-01-02 MED ORDER — HYDRALAZINE HCL 20 MG/ML IJ SOLN
10.0000 mg | Freq: Three times a day (TID) | INTRAMUSCULAR | Status: DC | PRN
  Administered 2018-01-02 – 2018-01-03 (×2): 10 mg via INTRAVENOUS
  Filled 2018-01-02 (×2): qty 1

## 2018-01-02 MED ORDER — BUSPIRONE HCL 5 MG PO TABS
5.0000 mg | ORAL_TABLET | Freq: Three times a day (TID) | ORAL | Status: DC
  Administered 2018-01-02 – 2018-01-03 (×5): 5 mg via ORAL
  Filled 2018-01-02 (×5): qty 1

## 2018-01-02 MED ORDER — LEVOFLOXACIN 750 MG PO TABS
750.0000 mg | ORAL_TABLET | Freq: Every day | ORAL | Status: DC
  Administered 2018-01-02 – 2018-01-03 (×2): 750 mg via ORAL
  Filled 2018-01-02 (×2): qty 1

## 2018-01-02 MED ORDER — METHYLPREDNISOLONE SODIUM SUCC 125 MG IJ SOLR
60.0000 mg | Freq: Two times a day (BID) | INTRAMUSCULAR | Status: DC
  Administered 2018-01-02 – 2018-01-04 (×4): 60 mg via INTRAVENOUS
  Filled 2018-01-02 (×4): qty 2

## 2018-01-02 NOTE — Care Management Note (Signed)
Case Management Note  Patient Details  Name: [name] MRN: [id number] Date of Birth: 13-Feb-1950  Subjective/Objective:                 Admitted with COPD. Pt from home, ind pta. Has home oxygen and neb machine. Active with [facility] for CM services. PT recommends HH PT and pt agreeable, has chosen [facility] from list of [facility] providers. Aware HH has 48hrs to make first visit.   Action/Plan: DC home  With [facility] PT. Referral given to [facility], [facility] rep. Potential for DC Sunday/Monday.   Expected Discharge Date:     12 /13/19             Expected Discharge Plan:  [place]  In-House Referral:  NA  Discharge planning Services  CM Consult  Post Acute Care Choice:  Home Health Choice offered to:  Patient  HH Arranged:  PT [place]:  [place]  Status of Service:  Completed, signed off  Sherald Barge, RN 01/02/2018, 11:57 AM

## 2018-01-02 NOTE — Plan of Care (Signed)
  Problem: Acute Rehab PT Goals(only PT should resolve) Goal: Patient Will Transfer Sit To/From Stand Outcome: Progressing Flowsheets (Taken 01/02/2018 1411) Patient will transfer sit to/from stand: Independently Goal: Pt Will Transfer Bed To Chair/Chair To Bed Outcome: Progressing Flowsheets (Taken 01/02/2018 1411) Pt will Transfer Bed to Chair/Chair to Bed: Independently Goal: Pt Will Ambulate Outcome: Progressing Flowsheets (Taken 01/02/2018 1411) Pt will Ambulate: 50 feet; with modified independence   2:11 PM, 01/02/18 Lonell Grandchild, MPT Physical Therapist with [facility] [phone] office [phone] mobile phone

## 2018-01-02 NOTE — Progress Notes (Signed)
PROGRESS NOTE    [name]  NWG:[id number] DOB: [birth year] DOA: 12/31/2017 PCP: Shelbie Ammons, MD     Brief Narrative:  67 y.o. male with a past medical history significant for depression/anxiety, aortic dissection type III, COPD with chronic bronchitis and chronic respiratory failure using 2.5-3 L nasal cannula supplementation at baseline; presented to the emergency department with worsening shortness of breath, productive cough and increased wheezing.  Patient expressed that his symptoms have been present for over a week now and have failed to improve.  He has had some upper respiratory infection symptoms and express no sick contacts.  Patient cough initially was nonproductive but has now moved to have some increased yellowish/white phlegm.  He reports some intermittent discomfort when coughing in his costal area.  Patient expressed that despite the use of inhaler treatments at home he continued to be short of breath.  She denies headache, blurred vision, focal weakness, hemoptysis, nausea, vomiting, dysuria, hematuria, melena, hematochezia or any other complaints.  In the ED patient was found to be unable to speak in full sentences, tachypneic and with oxygen saturation of 88 despite the use of 3 L.  He required to be placed between 5-7 L to maintain his O2 sats above 90%.   Assessment & Plan: 1-acute on chronic respiratory failure with hypoxia: In the setting of COPD exacerbation and bronchiectasis -Continue to have moderate exacerbation symptoms with difficulty speaking in full sentences and increased oxygen supplementation needs. -Has remained afebrile and slowly improving. -Still with diffuse expiratory wheezing and having difficulty speaking in full sentences.  -Continue IV steroids (starting slow tapering), DuoNeb, PRN albuterol, continue Brovana and Pulmicort -Continue Levaquin (now by mouth) and flutter valve -Patient will continue Mucinex every 12 hours -Continue  supportive care and try to wean oxygen supplementation as tolerated. -Continue PRN antitussives and antipyretics. -SIRS presentation resolved.  2-hypertension -Has remained stable to slightly increase -Continue metoprolol -Follow vital signs -PRN hydralazine ordered  3-hyperlipidemia -Continue statins  4-elevated troponin -Most likely in the setting of mild demand ischemia with ongoing COPD exacerbation -EKGs has remained without acute ischemic changes -Patient denies chest pain -Troponin with flat elevation suggesting no ischemic process -Continue metoprolol, statins and aspirin -Telemetry discontinued on 12/12  5-gastroesophageal reflux disease without esophagitis -Continue PPI  6-descending thoracic dissection -Continue outpatient follow-up -Patient has remained hemodynamically stable -No chest pain.  7-moderate protein calorie malnutrition -Continue Ensure twice a day -Appreciate nutritional service evaluation and recommendations  8-tobacco abuse -Extensive cessation counseling has been provided on 12/31/2016 -Continue nicotine patch  9-insomnia secondary to depression with anxiety -will start buspar -PRN trazodone for insomnia  10-Hypothyroidism -Continue Synthroid  DVT prophylaxis: Heparin Code Status: Full code Family Communication: No family at bedside Disposition Plan: Remains inpatient, continue steroids, continue nebulizer treatment, start the use of Mucinex, continue flutter valve and continue supportive care.  Consultants:   None  Procedures:   See below for x-ray reports.  Antimicrobials:  Anti-infectives (From admission, onward)   Start     Dose/Rate Route Frequency Ordered Stop   01/02/18 1215  levofloxacin (LEVAQUIN) tablet 750 mg     750 mg Oral Daily 01/02/18 1206     01/01/18 1200  levofloxacin (LEVAQUIN) IVPB 750 mg  Status:  Discontinued     750 mg 100 mL/hr over 90 Minutes Intravenous Every 24 hours 12/31/17 1906 01/02/18 1206    12/31/17 1315  cefTRIAXone (ROCEPHIN) 1 g in sodium chloride 0.9 % 100 mL IVPB     1  g 200 mL/hr over 30 Minutes Intravenous  Once 12/31/17 1301 12/31/17 1345   12/31/17 1315  azithromycin (ZITHROMAX) tablet 500 mg     500 mg Oral  Once 12/31/17 1301 12/31/17 1311       Subjective: Afebrile. No nausea, no vomiting. Still SOB and unable to speak in full sentences. Very tachypneic and SOB with minimal exertion.  Objective: Vitals:   01/02/18 0927 01/02/18 0931 01/02/18 0932 01/02/18 1031  BP:   (!) 180/115 (!) 165/110  Pulse:   (!) 102 93  Resp:      Temp:      TempSrc:      SpO2: 97% 99%    Weight:      Height:        Intake/Output Summary (Last 24 hours) at 01/02/2018 1225 Last data filed at 01/02/2018 0900 Gross per 24 hour  Intake 1223.25 ml  Output 700 ml  Net 523.25 ml   Filed Weights   12/31/17 1152  Weight: 74.8 kg    Examination: General exam: Alert, awake, oriented x 3; still SOB with minimal exertion, having difficulty speaking in full sentences and experiencing intermittent productive coughing spells. Patient with significant anxiety attack when unable to catch his breath; sometimes so much, that make him even more SOB. Respiratory system: fair air movement, diffuse exp wheezing, no crackles. No using accessory muscles currently. Positive rhonchi. Cardiovascular system:RRR. No murmurs, rubs, gallops. Gastrointestinal system: Abdomen is nondistended, soft and nontender. No organomegaly or masses felt. Normal bowel sounds heard. Central nervous system: Alert and oriented. No focal neurological deficits. Extremities: No C/C/E, +pedal pulses Skin: No rashes, lesions or ulcers Psychiatry: Judgement and insight appear normal. Mood & affect appropriate at this time.    Data Reviewed: I have personally reviewed following labs and imaging studies  CBC: Recent Labs  Lab 12/31/17 1208 01/01/18 0200  WBC 9.4 9.7  NEUTROABS 7.6  --   HGB 12.5* 11.4*  HCT 38.6*  36.2*  MCV 98.2 98.4  PLT 127* 151*   Basic Metabolic Panel: Recent Labs  Lab 12/31/17 1208 01/01/18 0200  NA 132* 133*  K 3.5 3.7  CL 97* 100  CO2 24 25  GLUCOSE 119* 201*  BUN 11 13  CREATININE 0.80 0.83  CALCIUM 7.9* 7.7*   GFR: Estimated Creatinine Clearance: 90.4 mL/min (by C-G formula based on SCr of 0.83 mg/dL).   Liver Function Tests: Recent Labs  Lab 12/31/17 1208  AST 23  ALT 11  ALKPHOS 52  BILITOT 0.6  PROT 7.1  ALBUMIN 3.3*   Coagulation Profile: Recent Labs  Lab 12/31/17 1208  INR 0.99   Cardiac Enzymes: Recent Labs  Lab 12/31/17 1211 12/31/17 2002 01/01/18 0200 01/01/18 0827  TROPONINI 0.06* 0.06* 0.06* 0.06*   Urine analysis:    Component Value Date/Time   COLORURINE YELLOW 12/31/2017 2047   APPEARANCEUR CLEAR 12/31/2017 2047   LABSPEC 1.025 12/31/2017 2047   PHURINE 5.0 12/31/2017 2047   GLUCOSEU 250 (A) 12/31/2017 2047   HGBUR MODERATE (A) 12/31/2017 2047   BILIRUBINUR NEGATIVE 12/31/2017 2047   Mineral NEGATIVE 12/31/2017 2047   PROTEINUR 100 (A) 12/31/2017 2047   UROBILINOGEN 0.2 08/26/2012 0930   NITRITE NEGATIVE 12/31/2017 2047   LEUKOCYTESUR NEGATIVE 12/31/2017 2047    Recent Results (from the past 240 hour(s))  Culture, blood (Routine x 2)     Status: None (Preliminary result)   Collection Time: 12/31/17 12:08 PM  Result Value Ref Range Status   Specimen Description BLOOD LEFT  FOREARM  Final   Special Requests   Final    BOTTLES DRAWN AEROBIC AND ANAEROBIC Blood Culture results may not be optimal due to an excessive volume of blood received in culture bottles   Culture   Final    NO GROWTH 2 DAYS Performed at [facility], [address] [place], [place] [address]    Report Status PENDING  Incomplete  Culture, blood (Routine x 2)     Status: None (Preliminary result)   Collection Time: 12/31/17 12:14 PM  Result Value Ref Range Status   Specimen Description BLOOD LEFT HAND  Final   Special Requests   Final     BOTTLES DRAWN AEROBIC AND ANAEROBIC Blood Culture results may not be optimal due to an inadequate volume of blood received in culture bottles   Culture   Final    NO GROWTH 2 DAYS Performed at [facility], [address], [place], [place] [address]    Report Status PENDING  Incomplete     Radiology Studies: Dg Chest Port 1 View  Result Date: 12/31/2017 CLINICAL DATA:  Shortness of breath EXAM: PORTABLE CHEST 1 VIEW COMPARISON:  March 17, 2017 FINDINGS: Lungs are somewhat hyperexpanded. There is chronic blunting of the left costophrenic angle. There is no frank edema or consolidation. Heart size and pulmonary vascularity are normal. No adenopathy. Aorta is mildly prominent but stable. No adenopathy. There is evidence of old trauma involving each lateral clavicle. IMPRESSION: Lungs hyperexpanded with chronic blunting of the left costophrenic angle. No edema or consolidation. Stable cardiac silhouette. Aortic prominence is likely indicative of chronic hypertensive change. Electronically Signed   By: Lowella Grip III M.D.   On: 12/31/2017 12:39    Scheduled Meds: . acetylcysteine  3 mL Nebulization Q6H  . arformoterol  15 mcg Nebulization BID  . aspirin  81 mg Oral Daily  . atorvastatin  20 mg Oral q1800  . budesonide (PULMICORT) nebulizer solution  0.5 mg Nebulization BID  . busPIRone  5 mg Oral TID  . dextromethorphan-guaiFENesin  1 tablet Oral BID  . feeding supplement (ENSURE ENLIVE)  237 mL Oral BID BM  . gabapentin  300 mg Oral TID  . heparin injection (subcutaneous)  5,000 Units Subcutaneous Q8H  . ipratropium-albuterol  3 mL Nebulization Q6H  . levofloxacin  750 mg Oral Daily  . levothyroxine  50 mcg Oral Q0600  . methylPREDNISolone (SOLU-MEDROL) injection  60 mg Intravenous Q12H  . metoprolol tartrate  25 mg Oral BID  . nicotine  21 mg Transdermal Daily  . pantoprazole  40 mg Oral QHS   Continuous Infusions: . sodium chloride 250 mL (12/31/17 1240)     LOS: 2 days      Time spent: 30 minutes.    Barton Dubois, MD [facility] Pager [phone]  If 7PM-7AM, please contact night-coverage [email or web address] Password [facility] 01/02/2018, 12:25 PM

## 2018-01-02 NOTE — Care Management Important Message (Signed)
Important Message  Patient Details  Name: [name] MRN: [id number] Date of Birth: May 10, 1950   Medicare Important Message Given:  Yes    Shelda Altes 01/02/2018, 1:49 PM

## 2018-01-02 NOTE — Progress Notes (Signed)
Hydralazine injection 10 mg PRN administered to patient for B/P 188/117. Will continue to monitor patient.

## 2018-01-02 NOTE — Evaluation (Signed)
Physical Therapy Evaluation Patient Details Name: [name] MRN: [id number] DOB: [birth year] Today's Date: 01/02/2018   History of Present Illness  [name] is a 67 y.o. male with a past medical history significant for depression/anxiety, aortic dissection type III, COPD with chronic bronchitis and chronic respiratory failure using 2.5-3 L nasal cannula supplementation at baseline; presented to the emergency department with worsening shortness of breath, productive cough and increased wheezing.  Patient expressed that his symptoms have been present for over a week now and have failed to improve.  He has had some upper respiratory infection symptoms and express no sick contacts.  Patient cough initially was nonproductive but has now moved to have some increased yellowish/white phlegm.  He reports some intermittent discomfort when coughing in his costal area.  Patient expressed that despite the use of inhaler treatments at home he continued to be short of breath.     Clinical Impression  Patient functioning near baseline for functional mobility and gait.  Patient mostly limited due to SOB limiting gait distance and out of bed activities.   Patient tolerated sitting up on commode in bathroom after therapy to attempt a bowel movement - nursing staff notified.  Patient will benefit from continued physical therapy in hospital and recommended venue below to increase strength, balance, endurance for safe ADLs and gait.     Follow Up Recommendations Home health PT;Supervision - Intermittent    Equipment Recommendations  None recommended by PT    Recommendations for Other Services       Precautions / Restrictions Precautions Precautions: Fall Precaution Comments: home oxygen dependent Restrictions Weight Bearing Restrictions: No      Mobility  Bed Mobility Overal bed mobility: Modified Independent             General bed mobility comments: head of bed raised  Transfers Overall  transfer level: Modified independent Equipment used: None             General transfer comment: increased time  Ambulation/Gait Ambulation/Gait assistance: Supervision Gait Distance (Feet): 15 Feet Assistive device: None Gait Pattern/deviations: Decreased step length - right;Decreased step length - left;Decreased stance time - left Gait velocity: decreased   General Gait Details: slightly labored cadence pushing O2 tank/roller without loss of balance, mostly limited due to SOB with exertion  Stairs            Wheelchair Mobility    Modified Rankin (Stroke Patients Only)       Balance Overall balance assessment: No apparent balance deficits (not formally assessed)                                           Pertinent Vitals/Pain      Home Living Family/patient expects to be discharged to:: Private residence Living Arrangements: Spouse/significant other Available Help at Discharge: Family Type of Home: House Home Access: Stairs to enter Entrance Stairs-Rails: None Entrance Stairs-Number of Steps: 5 Home Layout: One level Home Equipment: Cane - single point;Shower seat      Prior Function Level of Independence: Independent with assistive device(s)         Comments: household and short distanced community, drives     Journalist, newspaper        Extremity/Trunk Assessment   Upper Extremity Assessment Upper Extremity Assessment: Overall WFL for tasks assessed    Lower Extremity Assessment Lower Extremity Assessment: Generalized weakness  Cervical / Trunk Assessment Cervical / Trunk Assessment: Normal  Communication   Communication: No difficulties  Cognition Arousal/Alertness: Awake/alert Behavior During Therapy: WFL for tasks assessed/performed Overall Cognitive Status: Within Functional Limits for tasks assessed                                        General Comments      Exercises     Assessment/Plan     PT Assessment Patient needs continued PT services  PT Problem List Decreased strength;Decreased activity tolerance;Decreased mobility       PT Treatment Interventions Gait training;Stair training;Functional mobility training;Therapeutic activities;Patient/family education;Therapeutic exercise    PT Goals (Current goals can be found in the Care Plan section)  Acute Rehab PT Goals Patient Stated Goal: return home with spouse to assist PT Goal Formulation: With patient Time For Goal Achievement: 01/09/18 Potential to Achieve Goals: Good    Frequency Min 3X/week   Barriers to discharge        Co-evaluation               AM-PAC PT "6 Clicks" Mobility  Outcome Measure Help needed turning from your back to your side while in a flat bed without using bedrails?: None Help needed moving from lying on your back to sitting on the side of a flat bed without using bedrails?: None Help needed moving to and from a bed to a chair (including a wheelchair)?: None Help needed standing up from a chair using your arms (e.g., wheelchair or bedside chair)?: None Help needed to walk in hospital room?: A Little Help needed climbing 3-5 steps with a railing? : A Little 6 Click Score: 22    End of Session Equipment Utilized During Treatment: Oxygen Activity Tolerance: Patient tolerated treatment well;Patient limited by fatigue(Patient limited secondary to SOB) Patient left: Other (comment);with call bell/phone within reach(left in bathroom sitting on commode) Nurse Communication: Mobility status;Other (comment)(nursing staff informed that patient sitting on commode in bathroom) PT Visit Diagnosis: Unsteadiness on feet (R26.81);Other abnormalities of gait and mobility (R26.89);Muscle weakness (generalized) (M62.81)    Time: 5038-8828 PT Time Calculation (min) (ACUTE ONLY): 27 min   Charges:   PT Evaluation $PT Eval Moderate Complexity: 1 Mod PT Treatments $Therapeutic Activity: 23-37  mins        2:10 PM, 01/02/18 Lonell Grandchild, MPT Physical Therapist with [facility] 336 [phone] office [phone] mobile phone

## 2018-01-03 MED ORDER — ALPRAZOLAM 0.5 MG PO TABS
0.5000 mg | ORAL_TABLET | Freq: Three times a day (TID) | ORAL | Status: DC | PRN
  Administered 2018-01-03 – 2018-01-04 (×3): 0.5 mg via ORAL
  Filled 2018-01-03 (×3): qty 1

## 2018-01-03 MED ORDER — IPRATROPIUM-ALBUTEROL 0.5-2.5 (3) MG/3ML IN SOLN: 3.0000 mL | RESPIRATORY_TRACT | Status: DC | PRN

## 2018-01-03 MED ORDER — OXYCODONE-ACETAMINOPHEN 5-325 MG PO TABS
1.0000 | ORAL_TABLET | ORAL | Status: DC | PRN
  Administered 2018-01-03 – 2018-01-04 (×6): 1 via ORAL
  Filled 2018-01-03 (×6): qty 1

## 2018-01-03 MED ORDER — ALBUTEROL SULFATE (2.5 MG/3ML) 0.083% IN NEBU
INHALATION_SOLUTION | RESPIRATORY_TRACT | Status: AC
  Administered 2018-01-03: 5 mg
  Filled 2018-01-03: qty 6

## 2018-01-03 NOTE — Progress Notes (Signed)
Pt still exhibiting an anxiety attack at this time. Began during breathing treatment. Nurse encouraging pt to control breathing with return demonstration. Pt continues to ask for more medication for anxiety to "get him through this." Xanax recently given, see eMAR. Will continue to sit with patient and talk him through this.

## 2018-01-03 NOTE — Progress Notes (Signed)
PROGRESS NOTE    [name]  OXB:[id number] DOB: 06/07/1950 DOA: 12/31/2017 PCP: Shelbie Ammons, MD    Brief Narrative:  67 year old male who presented with dyspnea, increased wheezing and productive cough.  He does have significant past medical history for depression/anxiety, aortic dissection type III, and COPD with chronic hypoxic respiratory failure.  Patient reports 7 days of worsening dyspnea, associated with increased mucus production and wheezing, his symptoms were refractive to bronchodilator therapy at home, on his initial physical examination he was tachypneic and unable to speak in full sentences.  His oxygen saturation was 88% on 3 L per nasal cannula of supplemental oxygen, he was febrile, 103 F, his heart rate was 102 bpm.  He had moist mucous membranes, poor air movement and diffuse expiratory wheezing along with rhonchi, heart S1-S2 present, tachycardic, no rubs murmurs or gallops, abdomen soft nontender, no significant lower extremity edema.  His chest x-ray had significant hyperinflation with increased lung markings bilaterally no defined infiltrate.  His EKG was sinus rhythm, right inferior axis deviation, normal intervals.  Patient was admitted to the hospital with working diagnosis of acute on chronic hypoxic respiratory failure due to COPD exacerbation.  Assessment & Plan:   Principal Problem:   Acute on chronic respiratory failure with hypoxia (HCC) Active Problems:   HTN (hypertension)   Hypercholesterolemia   Elevated troponin   Descending thoracic dissection (HCC)   GERD without esophagitis   Severe protein-calorie malnutrition (HCC)   COPD exacerbation (HCC)   Insomnia secondary to depression with anxiety   1. Acute copd exacerbation, with acute on chronic hypoxic respiratory failure. Will continue aggressive bronchodilator therapy and systemic steroids, will continue oxymetry monitoring and supplemental 0-2 per [place]. At home patient on 3 LPM. Will add as  needed alprazolam for anxiety and increase frequency of oxycodone, apparently pain and anxiety are worsening his dyspnea. Out of bed as tolerated. Continue arformoterol and budesonide. Will hold on levofloxacin for now to prevent side effects.   2. HTN. Continue blood pressure control with metoprolol.   3. Dyslipidemia. Continue statin therapy with atorvastatin.   4. Hypothyroid. On levothyroxine.   5. Moderate calorie protein malnutrition. Continue nutritional supplements.    6. Tobacco abuse. Nicotine replacement, smoking cessation counseling.   7. Anxiety and depression. Continue trazodone, hydroxyzine and buspirone, will add as needed alprazolam.    DVT prophylaxis: enoxaparin   Code Status: full Family Communication: no family at the bedside  Disposition Plan/ discharge barriers: pending clinical improvement.   Body mass index is 23.68 kg/m. Malnutrition Type:  Nutrition Problem: Moderate Malnutrition Etiology: acute illness, chronic illness, decreased appetite(acute on chronic respiratory illness)   Malnutrition Characteristics:  Signs/Symptoms: energy intake < 75% for > 7 days, mild fat depletion, mild muscle depletion   Nutrition Interventions:  Interventions: Ensure Enlive (each supplement provides 350kcal and 20 grams of protein)  RN Pressure Injury Documentation: Pressure Ulcer 05/11/15 Stage II -  Partial thickness loss of dermis presenting as a shallow open ulcer with a red, pink wound bed without slough. two small finger sized, open, round areas at top/mid sacrum, and one healing spot on butt cheek (Active)  05/11/15 2000  Location: Sacrum  Location Orientation: Mid;Upper  Staging: Stage II -  Partial thickness loss of dermis presenting as a shallow open ulcer with a red, pink wound bed without slough.  Wound Description (Comments): two small finger sized, open, round areas at top/mid sacrum, and one healing spot on butt cheek  Present on Admission:  Yes      Consultants:     Procedures:     Antimicrobials:       Subjective: Patient continue to have significant dyspnea, worse with exertion, associated with wheezing and cough. Positive back pain and anxiety, persistent and making his dyspnea worse.   Objective: Vitals:   01/03/18 0533 01/03/18 0800 01/03/18 0802 01/03/18 0835  BP: (!) 132/97     Pulse: 83   96  Resp: 19   14  Temp: 98.1 F (36.7 C)     TempSrc: Oral     SpO2: 97% 98% 98% 98%  Weight:      Height:        Intake/Output Summary (Last 24 hours) at 01/03/2018 0930 Last data filed at 01/02/2018 1700 Gross per 24 hour  Intake 240 ml  Output -  Net 240 ml   Filed Weights   12/31/17 1152  Weight: 74.8 kg    Examination:   General: deconditioned and ill looking appearing  Neurology: Awake and alert, non focal  E ENT: mild pallor, no icterus, oral mucosa moist Cardiovascular: No JVD. S1-S2 present, rhythmic, no gallops, rubs, or murmurs. No lower extremity edema. Pulmonary: decreassed breath sounds bilaterally, poor air movement, positive expiratory wheezing, with no rhonchi or rales. Gastrointestinal. Abdomen with no organomegaly, non tender, no rebound or guarding Skin. No rashes Musculoskeletal: no joint deformities     Data Reviewed: I have personally reviewed following labs and imaging studies  CBC: Recent Labs  Lab 12/31/17 1208 01/01/18 0200  WBC 9.4 9.7  NEUTROABS 7.6  --   HGB 12.5* 11.4*  HCT 38.6* 36.2*  MCV 98.2 98.4  PLT 127* 073*   Basic Metabolic Panel: Recent Labs  Lab 12/31/17 1208 01/01/18 0200  NA 132* 133*  K 3.5 3.7  CL 97* 100  CO2 24 25  GLUCOSE 119* 201*  BUN 11 13  CREATININE 0.80 0.83  CALCIUM 7.9* 7.7*   GFR: Estimated Creatinine Clearance: 90.4 mL/min (by C-G formula based on SCr of 0.83 mg/dL). Liver Function Tests: Recent Labs  Lab 12/31/17 1208  AST 23  ALT 11  ALKPHOS 52  BILITOT 0.6  PROT 7.1  ALBUMIN 3.3*   No results for  input(s): LIPASE, AMYLASE in the last 168 hours. No results for input(s): AMMONIA in the last 168 hours. Coagulation Profile: Recent Labs  Lab 12/31/17 1208  INR 0.99   Cardiac Enzymes: Recent Labs  Lab 12/31/17 1211 12/31/17 2002 01/01/18 0200 01/01/18 0827  TROPONINI 0.06* 0.06* 0.06* 0.06*   BNP (last 3 results) No results for input(s): PROBNP in the last 8760 hours. HbA1C: No results for input(s): HGBA1C in the last 72 hours. CBG: No results for input(s): GLUCAP in the last 168 hours. Lipid Profile: No results for input(s): CHOL, HDL, LDLCALC, TRIG, CHOLHDL, LDLDIRECT in the last 72 hours. Thyroid Function Tests: No results for input(s): TSH, T4TOTAL, FREET4, T3FREE, THYROIDAB in the last 72 hours. Anemia Panel: No results for input(s): VITAMINB12, FOLATE, FERRITIN, TIBC, IRON, RETICCTPCT in the last 72 hours.    Radiology Studies: I have reviewed all of the imaging during this hospital visit personally     Scheduled Meds: . arformoterol  15 mcg Nebulization BID  . aspirin  81 mg Oral Daily  . atorvastatin  20 mg Oral q1800  . budesonide (PULMICORT) nebulizer solution  0.5 mg Nebulization BID  . busPIRone  5 mg Oral TID  . dextromethorphan-guaiFENesin  1 tablet Oral BID  . feeding supplement (  ENSURE ENLIVE)  237 mL Oral BID BM  . gabapentin  300 mg Oral TID  . heparin injection (subcutaneous)  5,000 Units Subcutaneous Q8H  . ipratropium-albuterol  3 mL Nebulization Q6H  . levofloxacin  750 mg Oral Daily  . levothyroxine  50 mcg Oral Q0600  . methylPREDNISolone (SOLU-MEDROL) injection  60 mg Intravenous Q12H  . metoprolol tartrate  25 mg Oral BID  . nicotine  21 mg Transdermal Daily  . pantoprazole  40 mg Oral QHS   Continuous Infusions: . sodium chloride 250 mL (12/31/17 1240)     LOS: 3 days        Mauricio Gerome Apley, MD [facility] Pager [phone]

## 2018-01-03 NOTE — Progress Notes (Signed)
Patient has sever anxiety, he had an attack while taking his breathing treatment. tx given.

## 2018-01-03 NOTE — Progress Notes (Signed)
Pt now calm after anxiety attack. Breathing non-labored. Vital signs stable. Able to carry on full conversation with nurse without stopping to catch breath. Will continue to monitor.

## 2018-01-03 NOTE — Progress Notes (Signed)
Stopping mucomyst, it is doubtful if drug will lossen any secretions if not done so by now.

## 2018-01-04 DIAGNOSIS — R6510 Systemic inflammatory response syndrome (SIRS) of non-infectious origin without acute organ dysfunction: Secondary | ICD-10-CM

## 2018-01-04 LAB — BASIC METABOLIC PANEL
Anion gap: 7 (ref 5–15)
BUN: 43 mg/dL — ABNORMAL HIGH (ref 8–23)
CHLORIDE: 96 mmol/L — AB (ref 98–111)
CO2: 33 mmol/L — ABNORMAL HIGH (ref 22–32)
Calcium: 8.3 mg/dL — ABNORMAL LOW (ref 8.9–10.3)
Creatinine, Ser: 1.12 mg/dL (ref 0.61–1.24)
GFR calc Af Amer: >60 mL/min (ref >60)
GFR calc non Af Amer: >60 mL/min (ref >60)
Glucose, Bld: 154 mg/dL — ABNORMAL HIGH (ref 70–99)
Potassium: 4.5 mmol/L (ref 3.5–5.1)
Sodium: 136 mmol/L (ref 135–145)

## 2018-01-04 MED ORDER — BENZONATATE 200 MG PO CAPS: 200.0000 mg | ORAL_CAPSULE | Freq: Three times a day (TID) | ORAL | 0 refills | Status: DC | PRN

## 2018-01-04 MED ORDER — ALPRAZOLAM 0.25 MG PO TABS: 0.2500 mg | ORAL_TABLET | Freq: Two times a day (BID) | ORAL | 0 refills | Status: AC | PRN

## 2018-01-04 MED ORDER — BUSPIRONE HCL 7.5 MG PO TABS: 7.5000 mg | ORAL_TABLET | Freq: Three times a day (TID) | ORAL | 0 refills | Status: AC

## 2018-01-04 MED ORDER — ALPRAZOLAM 0.25 MG PO TABS
0.2500 mg | ORAL_TABLET | Freq: Once | ORAL | Status: AC
  Administered 2018-01-04: 0.25 mg via ORAL
  Filled 2018-01-04: qty 1

## 2018-01-04 MED ORDER — NICOTINE 21 MG/24HR TD PT24: 21.0000 mg | MEDICATED_PATCH | TRANSDERMAL | 1 refills | Status: DC

## 2018-01-04 MED ORDER — ALBUTEROL SULFATE (2.5 MG/3ML) 0.083% IN NEBU: 2.5000 mg | INHALATION_SOLUTION | Freq: Four times a day (QID) | RESPIRATORY_TRACT | Status: AC | PRN

## 2018-01-04 MED ORDER — BUSPIRONE HCL 5 MG PO TABS
7.5000 mg | ORAL_TABLET | Freq: Three times a day (TID) | ORAL | Status: DC
  Administered 2018-01-04 (×2): 7.5 mg via ORAL
  Filled 2018-01-04 (×2): qty 2

## 2018-01-04 MED ORDER — ENSURE ENLIVE PO LIQD: 237.0000 mL | Freq: Two times a day (BID) | ORAL | Status: AC

## 2018-01-04 MED ORDER — PREDNISONE 20 MG PO TABS: ORAL_TABLET | ORAL | 0 refills | Status: DC

## 2018-01-04 MED ORDER — OXYCODONE-ACETAMINOPHEN 5-325 MG PO TABS: 1.0000 | ORAL_TABLET | Freq: Three times a day (TID) | ORAL | 0 refills | Status: AC | PRN

## 2018-01-04 MED ORDER — TRAZODONE HCL 100 MG PO TABS: 100.0000 mg | ORAL_TABLET | Freq: Every evening | ORAL | 0 refills | Status: AC | PRN

## 2018-01-04 MED ORDER — PREDNISONE 20 MG PO TABS
60.0000 mg | ORAL_TABLET | Freq: Every day | ORAL | Status: DC
  Administered 2018-01-04: 60 mg via ORAL
  Filled 2018-01-04: qty 3

## 2018-01-04 NOTE — Progress Notes (Signed)
Patient has such bad anxiety that even the thought of losing his breath or oxygen change can cause him to have an attack of anxiety. At which time his work of breathing increases such his air ways shut down. If anxiety can be controled his work of breathing can be corrected.

## 2018-01-04 NOTE — Discharge Summary (Signed)
Physician Discharge Summary  MOE GRACA JGG:[id number] DOB: 01/19/1951 DOA: 12/31/2017  PCP: Shelbie Ammons, MD  Admit date: 12/31/2017 Discharge date: 01/04/2018  Time spent: 35 minutes  Recommendations for Outpatient Follow-up:  1-repeat basic metabolic panel to follow electrolytes and renal function 2-arrange outpatient follow-up with pulmonary service to repeat PFTs and further adjust maintenance therapy for his COPD. 3-close follow-up on patient's mood instability with further adjustment to his regimen and if needed referral to psychiatry service. 4-continue to follow and assist as needed on patient smoking cessation journey.  Discharge Diagnoses:  Principal Problem:   Acute on chronic respiratory failure with hypoxia (HCC) Active Problems:   HTN (hypertension)   Hypercholesterolemia   Elevated troponin   Descending thoracic dissection (HCC)   GERD without esophagitis   Moderate protein malnutrition (HCC)   COPD exacerbation (HCC)   Insomnia secondary to depression with anxiety   SIRS (systemic inflammatory response syndrome) ([place])   Discharge Condition: Stable and improved.  Patient discharged home with instruction to follow-up with PCP in 10 days.  Diet recommendation: Heart healthy diet  Filed Weights   12/31/17 1152  Weight: 74.8 kg    History of present illness:  67 y.o.malewith a past medical history significant for depression/anxiety, aortic dissection type III, COPD with chronic bronchitis and chronic respiratory failure using 2.5-3 L nasal cannula supplementation at baseline; presented to the emergency department with worsening shortness of breath, productive cough and increased wheezing. Patient expressed that his symptoms have been present for over a week now and have failed to improve. He has had some upper respiratory infection symptoms and express no sick contacts. Patient cough initially was nonproductive but has now moved to have some increased  yellowish/white phlegm.He reports some intermittent discomfort when coughing in his costal area.Patient expressed that despite the use of inhaler treatments at home he continued to be short of breath.  She denies headache, blurred vision, focal weakness, hemoptysis, nausea, vomiting, dysuria, hematuria, melena, hematochezia or any other complaints.  In the ED patient was found to be unable to speak in full sentences, tachypneic and with oxygen saturation of 88 despite the use of 3 L. He required to be placed between 5-7 L to maintain his O2 sats above 90%.  Hospital Course:  1-acute on chronic respiratory failure with hypoxia: In the setting of COPD exacerbation and bronchiectasis. -At discharge patient was able to speak in full sentences and even is still having some shortness of breath on exertion significantly improve and very close to his baseline oxygen supplementation 3.5-4 L nasal cannula. -Patient remained afebrile and not demonstrating any acute systemic infection. -He completed antibiotic therapy for bronchiectasis while inpatient -At discharge will use steroids tapering, resume the use of as needed albuterol and Trelegy Ellipta. -Continue PRN antitussives and the use of flutter valve as instructed. -Will benefit of outpatient follow-up by pulmonary service for repeat PFTs and further adjustment on maintenance therapy.  2-essential hypertension -Stable overall -Continue metoprolol -Patient advised to follow heart healthy diet.  3-hyperlipidemia  -continue statins  4-depression/anxiety/insomnia -Patient has been discharged on BuSpar and trazodone -Prescription for as needed Xanax for severe anxiety attacks also provided -Will benefit of outpatient follow-up with psychiatry service. -No suicidal ideation or hallucinations at discharge.  5-elevated troponin -Most likely in the setting of mild demand ischemia with ongoing COPD exacerbation -EKGs remain without acute ischemic  changes -The patient denies any active chest pain and his telemetry did not demonstrate any abnormal changes -Troponin was checked x4  with just mild flat elevation suggesting no acute ischemic process -Will continue the use of beta-blocker, continue statins and aspirin.  6-gastroesophageal reflux disease -Continue PPI.  7-descending thoracic dissection -Continue outpatient follow-up -Patient has remained hemodynamically stable and reports no chest pain.  8-tobacco abuse -Extensive cessation counseling has been provided -Continue nicotine patch at discharge  9-hypothyroidism -Continue Synthroid.  10-chronic pain syndrome -Continue the use of as needed Percocet. -continue neurontin  11-moderate calorie malnutrition -Patient instructed to use Ensure twice a day.  Procedures:  See below for x-ray reports  Consultations:  None  Discharge Exam: Vitals:   01/04/18 1337 01/04/18 1440  BP: 130/81   Pulse: 78   Resp: 20   Temp: 98.1 F (36.7 C)   SpO2: 99% 98%    General: Afebrile; no chest pain.  Patient with significant improvement in his respiratory process and able to speak in full sentences at discharge.  Oxygen supplementation essentially back to baseline. Cardiovascular: S1 and S2, no rubs, no gallops, no JVD.  No murmurs appreciated on exam. Respiratory: Improved air movement bilaterally, very little expiratory wheezing on exam; no crackles.  Normal respiratory effort. Abdomen: Soft, nontender, nondistended, positive bowel sounds Extremities: No edema, no cyanosis, no clubbing   Discharge Instructions   Discharge Instructions    Diet - low sodium heart healthy   Complete by:  As directed    Discharge instructions   Complete by:  As directed    Take medications as prescribed Arrange follow-up with PCP in 10 days Use oxygen supplementation as instructed Pace yourself while doing activities  Please avoid smoking and secondhand exposure.   Increase activity  slowly   Complete by:  As directed      Allergies as of 01/04/2018      Reactions   Nsaids Shortness Of Breath   Bee Venom Swelling   Neck swelling, passes out   Codeine Other (See Comments)   Causes GI Upset      Medication List    STOP taking these medications   cyclobenzaprine 10 MG tablet Commonly known as:  FLEXERIL   traMADol 50 MG tablet Commonly known as:  ULTRAM     TAKE these medications   ALPRAZolam 0.25 MG tablet Commonly known as:  XANAX Take 1 tablet (0.25 mg total) by mouth 2 (two) times daily as needed for anxiety.   aspirin 81 MG chewable tablet Chew 1 tablet (81 mg total) by mouth daily.   atorvastatin 20 MG tablet Commonly known as:  LIPITOR Take 20 mg by mouth daily.   benzonatate 200 MG capsule Commonly known as:  TESSALON Take 1 capsule (200 mg total) by mouth 3 (three) times daily as needed for cough.   busPIRone 7.5 MG tablet Commonly known as:  BUSPAR Take 1 tablet (7.5 mg total) by mouth 3 (three) times daily.   feeding supplement (ENSURE ENLIVE) Liqd Take 237 mLs by mouth 2 (two) times daily between meals.   gabapentin 300 MG capsule Commonly known as:  NEURONTIN Take 300 mg by mouth 3 (three) times daily.   levothyroxine 50 MCG tablet Commonly known as:  SYNTHROID, LEVOTHROID Take 50 mcg by mouth daily.   metoprolol tartrate 25 MG tablet Commonly known as:  LOPRESSOR Take 1 tablet (25 mg total) by mouth 2 (two) times daily.   nicotine 21 mg/24hr patch Commonly known as:  NICODERM CQ - dosed in mg/24 hours Place 1 patch (21 mg total) onto the skin daily.   oxyCODONE-acetaminophen 5-325 MG tablet  Commonly known as:  PERCOCET/ROXICET Take 1 tablet by mouth every 8 (eight) hours as needed for up to 7 days for severe pain. What changed:  when to take this   OXYGEN Inhale 3.5 L into the lungs daily as needed (for shortness of breath). 3-3.5lpm 24/7   pantoprazole 40 MG tablet Commonly known as:  PROTONIX Take 1 tablet (40  mg total) by mouth at bedtime.   predniSONE 20 MG tablet Commonly known as:  DELTASONE Take 3 tablets daily by mouth x1 day; then 2 tablets by mouth daily x2 days; then 1 tablet by mouth daily 3 days; then half tablet by mouth daily x3 days and stop prednisone.   traZODone 100 MG tablet Commonly known as:  DESYREL Take 1 tablet (100 mg total) by mouth at bedtime as needed for sleep.   TRELEGY ELLIPTA 100-62.5-25 MCG/INH Aepb Generic drug:  Fluticasone-Umeclidin-Vilant Inhale 1 puff into the lungs daily.   VENTOLIN HFA 108 (90 Base) MCG/ACT inhaler Generic drug:  albuterol Inhale 2 puffs into the lungs every 6 (six) hours as needed for wheezing or shortness of breath. What changed:  Another medication with the same name was changed. Make sure you understand how and when to take each.   albuterol (2.5 MG/3ML) 0.083% nebulizer solution Commonly known as:  PROVENTIL Take 3 mLs (2.5 mg total) by nebulization every 6 (six) hours as needed for wheezing or shortness of breath. What changed:  when to take this      Allergies  Allergen Reactions  . Nsaids Shortness Of Breath  . Bee Venom Swelling    Neck swelling, passes out  . Codeine Other (See Comments)    Causes GI Upset   Follow-up Information    Shelbie Ammons, MD. Schedule an appointment as soon as possible for a visit in 10 day(s).   Specialty:  Internal Medicine           The results of significant diagnostics from this hospitalization (including imaging, microbiology, ancillary and laboratory) are listed below for reference.    Significant Diagnostic Studies: Dg Chest Port 1 View  Result Date: 12/31/2017 CLINICAL DATA:  Shortness of breath EXAM: PORTABLE CHEST 1 VIEW COMPARISON:  March 17, 2017 FINDINGS: Lungs are somewhat hyperexpanded. There is chronic blunting of the left costophrenic angle. There is no frank edema or consolidation. Heart size and pulmonary vascularity are normal. No adenopathy. Aorta is  mildly prominent but stable. No adenopathy. There is evidence of old trauma involving each lateral clavicle. IMPRESSION: Lungs hyperexpanded with chronic blunting of the left costophrenic angle. No edema or consolidation. Stable cardiac silhouette. Aortic prominence is likely indicative of chronic hypertensive change. Electronically Signed   By: Lowella Grip III M.D.   On: 12/31/2017 12:39    Microbiology: Recent Results (from the past 240 hour(s))  Culture, blood (Routine x 2)     Status: None (Preliminary result)   Collection Time: 12/31/17 12:08 PM  Result Value Ref Range Status   Specimen Description BLOOD LEFT FOREARM  Final   Special Requests   Final    BOTTLES DRAWN AEROBIC AND ANAEROBIC Blood Culture results may not be optimal due to an excessive volume of blood received in culture bottles   Culture   Final    NO GROWTH 4 DAYS Performed at [facility], [address] [place], [place] [address]    Report Status PENDING  Incomplete  Culture, blood (Routine x 2)     Status: None (Preliminary result)  Collection Time: 12/31/17 12:14 PM  Result Value Ref Range Status   Specimen Description BLOOD LEFT HAND  Final   Special Requests   Final    BOTTLES DRAWN AEROBIC AND ANAEROBIC Blood Culture results may not be optimal due to an inadequate volume of blood received in culture bottles   Culture   Final    NO GROWTH 4 DAYS Performed at [facility], [address] [place], [place] [address]    Report Status PENDING  Incomplete     Labs: Basic Metabolic Panel: Recent Labs  Lab 12/31/17 1208 01/01/18 0200 01/04/18 0632  NA 132* 133* 136  K 3.5 3.7 4.5  CL 97* 100 96*  CO2 24 25 33*  GLUCOSE 119* 201* 154*  BUN 11 13 43*  CREATININE 0.80 0.83 1.12  CALCIUM 7.9* 7.7* 8.3*   Liver Function Tests: Recent Labs  Lab 12/31/17 1208  AST 23  ALT 11  ALKPHOS 52  BILITOT 0.6  PROT 7.1  ALBUMIN 3.3*   CBC: Recent Labs  Lab 12/31/17 1208 01/01/18 0200  WBC 9.4 9.7   NEUTROABS 7.6  --   HGB 12.5* 11.4*  HCT 38.6* 36.2*  MCV 98.2 98.4  PLT 127* 127*   Cardiac Enzymes: Recent Labs  Lab 12/31/17 1211 12/31/17 2002 01/01/18 0200 01/01/18 0827  TROPONINI 0.06* 0.06* 0.06* 0.06*    Signed:  Barton Dubois MD.  [facility] 01/04/2018, 3:37 PM

## 2018-01-05 ENCOUNTER — Other Ambulatory Visit: Payer: Self-pay

## 2018-01-05 ENCOUNTER — Inpatient Hospital Stay (HOSPITAL_COMMUNITY)
Admission: EM | Admit: 2018-01-05 | Discharge: 2018-01-18 | DRG: 280 | Disposition: A | Discharge status: Home Health | Payer: Medicare PPO | Attending: Internal Medicine | Admitting: Internal Medicine

## 2018-01-05 ENCOUNTER — Encounter (HOSPITAL_COMMUNITY): Payer: Self-pay | Admitting: Emergency Medicine

## 2018-01-05 ENCOUNTER — Emergency Department (HOSPITAL_COMMUNITY): Payer: Medicare PPO

## 2018-01-05 DIAGNOSIS — R578 Other shock: Secondary | ICD-10-CM | POA: Diagnosis not present

## 2018-01-05 DIAGNOSIS — R069 Unspecified abnormalities of breathing: Secondary | ICD-10-CM

## 2018-01-05 DIAGNOSIS — Z87891 Personal history of nicotine dependence: Secondary | ICD-10-CM | POA: Diagnosis not present

## 2018-01-05 DIAGNOSIS — R0689 Other abnormalities of breathing: Secondary | ICD-10-CM | POA: Diagnosis not present

## 2018-01-05 DIAGNOSIS — Z955 Presence of coronary angioplasty implant and graft: Secondary | ICD-10-CM

## 2018-01-05 DIAGNOSIS — Z7982 Long term (current) use of aspirin: Secondary | ICD-10-CM

## 2018-01-05 DIAGNOSIS — Z7952 Long term (current) use of systemic steroids: Secondary | ICD-10-CM

## 2018-01-05 DIAGNOSIS — Y92239 Unspecified place in hospital as the place of occurrence of the external cause: Secondary | ICD-10-CM | POA: Diagnosis not present

## 2018-01-05 DIAGNOSIS — D62 Acute posthemorrhagic anemia: Secondary | ICD-10-CM | POA: Diagnosis not present

## 2018-01-05 DIAGNOSIS — R0602 Shortness of breath: Secondary | ICD-10-CM | POA: Diagnosis not present

## 2018-01-05 DIAGNOSIS — R7989 Other specified abnormal findings of blood chemistry: Secondary | ICD-10-CM | POA: Diagnosis not present

## 2018-01-05 DIAGNOSIS — R0789 Other chest pain: Secondary | ICD-10-CM

## 2018-01-05 DIAGNOSIS — R269 Unspecified abnormalities of gait and mobility: Secondary | ICD-10-CM | POA: Diagnosis not present

## 2018-01-05 DIAGNOSIS — E7800 Pure hypercholesterolemia, unspecified: Secondary | ICD-10-CM | POA: Diagnosis present

## 2018-01-05 DIAGNOSIS — D72829 Elevated white blood cell count, unspecified: Secondary | ICD-10-CM | POA: Diagnosis not present

## 2018-01-05 DIAGNOSIS — J9811 Atelectasis: Secondary | ICD-10-CM | POA: Diagnosis not present

## 2018-01-05 DIAGNOSIS — R5382 Chronic fatigue, unspecified: Secondary | ICD-10-CM | POA: Diagnosis present

## 2018-01-05 DIAGNOSIS — M879 Osteonecrosis, unspecified: Secondary | ICD-10-CM | POA: Diagnosis present

## 2018-01-05 DIAGNOSIS — Z886 Allergy status to analgesic agent status: Secondary | ICD-10-CM | POA: Diagnosis not present

## 2018-01-05 DIAGNOSIS — I214 Non-ST elevation (NSTEMI) myocardial infarction: Secondary | ICD-10-CM | POA: Diagnosis present

## 2018-01-05 DIAGNOSIS — T380X5A Adverse effect of glucocorticoids and synthetic analogues, initial encounter: Secondary | ICD-10-CM | POA: Diagnosis not present

## 2018-01-05 DIAGNOSIS — I10 Essential (primary) hypertension: Secondary | ICD-10-CM | POA: Diagnosis present

## 2018-01-05 DIAGNOSIS — I951 Orthostatic hypotension: Secondary | ICD-10-CM | POA: Diagnosis present

## 2018-01-05 DIAGNOSIS — Z79891 Long term (current) use of opiate analgesic: Secondary | ICD-10-CM

## 2018-01-05 DIAGNOSIS — J441 Chronic obstructive pulmonary disease with (acute) exacerbation: Secondary | ICD-10-CM | POA: Diagnosis present

## 2018-01-05 DIAGNOSIS — Z91018 Allergy to other foods: Secondary | ICD-10-CM

## 2018-01-05 DIAGNOSIS — G9340 Encephalopathy, unspecified: Secondary | ICD-10-CM | POA: Diagnosis not present

## 2018-01-05 DIAGNOSIS — Z951 Presence of aortocoronary bypass graft: Secondary | ICD-10-CM

## 2018-01-05 DIAGNOSIS — J9601 Acute respiratory failure with hypoxia: Secondary | ICD-10-CM | POA: Diagnosis not present

## 2018-01-05 DIAGNOSIS — R062 Wheezing: Secondary | ICD-10-CM | POA: Diagnosis not present

## 2018-01-05 DIAGNOSIS — R778 Other specified abnormalities of plasma proteins: Secondary | ICD-10-CM

## 2018-01-05 DIAGNOSIS — J90 Pleural effusion, not elsewhere classified: Secondary | ICD-10-CM | POA: Diagnosis present

## 2018-01-05 DIAGNOSIS — Z885 Allergy status to narcotic agent status: Secondary | ICD-10-CM | POA: Diagnosis not present

## 2018-01-05 DIAGNOSIS — Z91030 Bee allergy status: Secondary | ICD-10-CM

## 2018-01-05 DIAGNOSIS — Z9981 Dependence on supplemental oxygen: Secondary | ICD-10-CM

## 2018-01-05 DIAGNOSIS — I252 Old myocardial infarction: Secondary | ICD-10-CM | POA: Diagnosis not present

## 2018-01-05 DIAGNOSIS — I209 Angina pectoris, unspecified: Secondary | ICD-10-CM | POA: Diagnosis present

## 2018-01-05 DIAGNOSIS — M48061 Spinal stenosis, lumbar region without neurogenic claudication: Secondary | ICD-10-CM | POA: Diagnosis present

## 2018-01-05 DIAGNOSIS — I2510 Atherosclerotic heart disease of native coronary artery without angina pectoris: Secondary | ICD-10-CM | POA: Diagnosis present

## 2018-01-05 DIAGNOSIS — R58 Hemorrhage, not elsewhere classified: Secondary | ICD-10-CM | POA: Diagnosis not present

## 2018-01-05 DIAGNOSIS — Z825 Family history of asthma and other chronic lower respiratory diseases: Secondary | ICD-10-CM | POA: Diagnosis not present

## 2018-01-05 DIAGNOSIS — F419 Anxiety disorder, unspecified: Secondary | ICD-10-CM | POA: Diagnosis present

## 2018-01-05 DIAGNOSIS — Z8249 Family history of ischemic heart disease and other diseases of the circulatory system: Secondary | ICD-10-CM | POA: Diagnosis not present

## 2018-01-05 DIAGNOSIS — E785 Hyperlipidemia, unspecified: Secondary | ICD-10-CM | POA: Diagnosis present

## 2018-01-05 DIAGNOSIS — Z79890 Hormone replacement therapy (postmenopausal): Secondary | ICD-10-CM

## 2018-01-05 DIAGNOSIS — J9621 Acute and chronic respiratory failure with hypoxia: Secondary | ICD-10-CM | POA: Diagnosis present

## 2018-01-05 DIAGNOSIS — I493 Ventricular premature depolarization: Secondary | ICD-10-CM | POA: Diagnosis present

## 2018-01-05 DIAGNOSIS — K219 Gastro-esophageal reflux disease without esophagitis: Secondary | ICD-10-CM | POA: Diagnosis present

## 2018-01-05 DIAGNOSIS — I712 Thoracic aortic aneurysm, without rupture: Secondary | ICD-10-CM | POA: Diagnosis not present

## 2018-01-05 DIAGNOSIS — E861 Hypovolemia: Secondary | ICD-10-CM | POA: Diagnosis not present

## 2018-01-05 DIAGNOSIS — R29818 Other symptoms and signs involving the nervous system: Secondary | ICD-10-CM | POA: Diagnosis not present

## 2018-01-05 DIAGNOSIS — I71012 Dissection of descending thoracic aorta: Secondary | ICD-10-CM | POA: Diagnosis present

## 2018-01-05 DIAGNOSIS — Z79899 Other long term (current) drug therapy: Secondary | ICD-10-CM

## 2018-01-05 DIAGNOSIS — R0600 Dyspnea, unspecified: Secondary | ICD-10-CM | POA: Diagnosis not present

## 2018-01-05 DIAGNOSIS — Z800 Family history of malignant neoplasm of digestive organs: Secondary | ICD-10-CM

## 2018-01-05 DIAGNOSIS — F411 Generalized anxiety disorder: Secondary | ICD-10-CM | POA: Diagnosis present

## 2018-01-05 DIAGNOSIS — G894 Chronic pain syndrome: Secondary | ICD-10-CM | POA: Diagnosis present

## 2018-01-05 DIAGNOSIS — R4781 Slurred speech: Secondary | ICD-10-CM | POA: Diagnosis not present

## 2018-01-05 DIAGNOSIS — I7101 Dissection of thoracic aorta: Secondary | ICD-10-CM | POA: Diagnosis present

## 2018-01-05 DIAGNOSIS — R000 Tachycardia, unspecified: Secondary | ICD-10-CM | POA: Diagnosis not present

## 2018-01-05 DIAGNOSIS — D649 Anemia, unspecified: Secondary | ICD-10-CM | POA: Diagnosis not present

## 2018-01-05 DIAGNOSIS — Z8711 Personal history of peptic ulcer disease: Secondary | ICD-10-CM

## 2018-01-05 DIAGNOSIS — J9600 Acute respiratory failure, unspecified whether with hypoxia or hypercapnia: Secondary | ICD-10-CM

## 2018-01-05 DIAGNOSIS — F329 Major depressive disorder, single episode, unspecified: Secondary | ICD-10-CM | POA: Diagnosis present

## 2018-01-05 DIAGNOSIS — I350 Nonrheumatic aortic (valve) stenosis: Secondary | ICD-10-CM | POA: Diagnosis not present

## 2018-01-05 DIAGNOSIS — R109 Unspecified abdominal pain: Secondary | ICD-10-CM | POA: Diagnosis not present

## 2018-01-05 DIAGNOSIS — I2583 Coronary atherosclerosis due to lipid rich plaque: Secondary | ICD-10-CM | POA: Diagnosis not present

## 2018-01-05 LAB — TROPONIN I
TROPONIN I: 0.12 ng/mL — AB (ref <0.03)
Troponin I: 0.16 ng/mL (ref <0.03)

## 2018-01-05 LAB — BASIC METABOLIC PANEL
Anion gap: 9 (ref 5–15)
BUN: 31 mg/dL — ABNORMAL HIGH (ref 8–23)
CO2: 33 mmol/L — ABNORMAL HIGH (ref 22–32)
Calcium: 8.7 mg/dL — ABNORMAL LOW (ref 8.9–10.3)
Chloride: 98 mmol/L (ref 98–111)
Creatinine, Ser: 0.87 mg/dL (ref 0.61–1.24)
Glucose, Bld: 146 mg/dL — ABNORMAL HIGH (ref 70–99)
Potassium: 4 mmol/L (ref 3.5–5.1)
Sodium: 140 mmol/L (ref 135–145)

## 2018-01-05 LAB — BLOOD GAS, ARTERIAL
Acid-Base Excess: 9.3 mmol/L — ABNORMAL HIGH (ref 0.0–2.0)
Bicarbonate: 31.9 mmol/L — ABNORMAL HIGH (ref 20.0–28.0)
Drawn by: 317771
O2 CONTENT: 4 L/min
O2 Saturation: 94.8 %
pCO2 arterial: 56.2 mmHg — ABNORMAL HIGH (ref 32.0–48.0)
pH, Arterial: 7.403 (ref 7.350–7.450)
pO2, Arterial: 73.4 mmHg — ABNORMAL LOW (ref 83.0–108.0)

## 2018-01-05 LAB — CBC WITH DIFFERENTIAL/PLATELET
Abs Immature Granulocytes: 0.16 10*3/uL — ABNORMAL HIGH (ref 0.00–0.07)
BASOS PCT: 0 %
Basophils Absolute: 0 10*3/uL (ref 0.0–0.1)
Eosinophils Absolute: 0.1 10*3/uL (ref 0.0–0.5)
Eosinophils Relative: 0 %
HCT: 43 % (ref 39.0–52.0)
Hemoglobin: 13.1 g/dL (ref 13.0–17.0)
Immature Granulocytes: 1 %
Lymphocytes Relative: 9 %
Lymphs Abs: 1.2 10*3/uL (ref 0.7–4.0)
MCH: 31 pg (ref 26.0–34.0)
MCHC: 30.5 g/dL (ref 30.0–36.0)
MCV: 101.7 fL — ABNORMAL HIGH (ref 80.0–100.0)
Monocytes Absolute: 0.9 10*3/uL (ref 0.1–1.0)
Monocytes Relative: 7 %
Neutro Abs: 10.8 10*3/uL — ABNORMAL HIGH (ref 1.7–7.7)
Neutrophils Relative %: 83 %
PLATELETS: 223 10*3/uL (ref 150–400)
RBC: 4.23 MIL/uL (ref 4.22–5.81)
RDW: 13.2 % (ref 11.5–15.5)
WBC: 13.1 10*3/uL — ABNORMAL HIGH (ref 4.0–10.5)
nRBC: 0 % (ref 0.0–0.2)

## 2018-01-05 LAB — CULTURE, BLOOD (ROUTINE X 2)
Culture: NO GROWTH
Culture: NO GROWTH

## 2018-01-05 LAB — I-STAT TROPONIN, ED: Troponin i, poc: 0.17 ng/mL (ref 0.00–0.08)

## 2018-01-05 LAB — MRSA PCR SCREENING: MRSA by PCR: NEGATIVE

## 2018-01-05 LAB — BRAIN NATRIURETIC PEPTIDE: B Natriuretic Peptide: 422 pg/mL — ABNORMAL HIGH (ref 0.0–100.0)

## 2018-01-05 MED ORDER — ALPRAZOLAM 0.25 MG PO TABS
0.2500 mg | ORAL_TABLET | Freq: Two times a day (BID) | ORAL | Status: DC | PRN
  Administered 2018-01-06: 0.25 mg via ORAL
  Filled 2018-01-05: qty 1

## 2018-01-05 MED ORDER — METHYLPREDNISOLONE SODIUM SUCC 125 MG IJ SOLR
60.0000 mg | Freq: Two times a day (BID) | INTRAMUSCULAR | Status: DC
  Administered 2018-01-06: 60 mg via INTRAVENOUS
  Filled 2018-01-05: qty 2

## 2018-01-05 MED ORDER — ACETAMINOPHEN 325 MG PO TABS
650.0000 mg | ORAL_TABLET | Freq: Four times a day (QID) | ORAL | Status: DC | PRN
  Administered 2018-01-08: 650 mg via ORAL
  Filled 2018-01-05: qty 2

## 2018-01-05 MED ORDER — POLYETHYLENE GLYCOL 3350 17 G PO PACK
17.0000 g | PACK | Freq: Every day | ORAL | Status: DC | PRN
  Administered 2018-01-11: 17 g via ORAL
  Filled 2018-01-05: qty 1

## 2018-01-05 MED ORDER — MORPHINE SULFATE (PF) 4 MG/ML IV SOLN
4.0000 mg | Freq: Once | INTRAVENOUS | Status: AC
  Administered 2018-01-05: 4 mg via INTRAVENOUS
  Filled 2018-01-05: qty 1

## 2018-01-05 MED ORDER — GABAPENTIN 300 MG PO CAPS
300.0000 mg | ORAL_CAPSULE | Freq: Three times a day (TID) | ORAL | Status: DC
  Administered 2018-01-05 – 2018-01-18 (×36): 300 mg via ORAL
  Filled 2018-01-05 (×5): qty 1
  Filled 2018-01-05: qty 3
  Filled 2018-01-05 (×17): qty 1
  Filled 2018-01-05: qty 3
  Filled 2018-01-05 (×3): qty 1
  Filled 2018-01-05: qty 3
  Filled 2018-01-05 (×9): qty 1

## 2018-01-05 MED ORDER — FLUTICASONE PROPIONATE HFA 110 MCG/ACT IN AERO: 2.0000 | INHALATION_SPRAY | Freq: Two times a day (BID) | RESPIRATORY_TRACT | Status: DC

## 2018-01-05 MED ORDER — IPRATROPIUM-ALBUTEROL 0.5-2.5 (3) MG/3ML IN SOLN
3.0000 mL | Freq: Four times a day (QID) | RESPIRATORY_TRACT | Status: DC
  Administered 2018-01-05 – 2018-01-08 (×13): 3 mL via RESPIRATORY_TRACT
  Filled 2018-01-05 (×13): qty 3

## 2018-01-05 MED ORDER — LORAZEPAM 2 MG/ML IJ SOLN
0.5000 mg | Freq: Once | INTRAMUSCULAR | Status: AC
  Administered 2018-01-05: 0.5 mg via INTRAVENOUS
  Filled 2018-01-05: qty 1

## 2018-01-05 MED ORDER — MORPHINE SULFATE (PF) 4 MG/ML IV SOLN: 4.0000 mg | Freq: Once | INTRAVENOUS | Status: DC

## 2018-01-05 MED ORDER — ONDANSETRON HCL 4 MG PO TABS: 4.0000 mg | ORAL_TABLET | Freq: Four times a day (QID) | ORAL | Status: DC | PRN

## 2018-01-05 MED ORDER — LEVOTHYROXINE SODIUM 50 MCG PO TABS
50.0000 ug | ORAL_TABLET | Freq: Every day | ORAL | Status: DC
  Administered 2018-01-06 – 2018-01-18 (×11): 50 ug via ORAL
  Filled 2018-01-05 (×12): qty 1

## 2018-01-05 MED ORDER — ASPIRIN 81 MG PO CHEW
81.0000 mg | CHEWABLE_TABLET | Freq: Every day | ORAL | Status: DC
  Administered 2018-01-06 – 2018-01-09 (×5): 81 mg via ORAL
  Filled 2018-01-05 (×4): qty 1

## 2018-01-05 MED ORDER — SODIUM CHLORIDE 0.9 % IV BOLUS
500.0000 mL | Freq: Once | INTRAVENOUS | Status: AC
  Administered 2018-01-05: 500 mL via INTRAVENOUS

## 2018-01-05 MED ORDER — BUDESONIDE 0.5 MG/2ML IN SUSP
0.5000 mg | Freq: Two times a day (BID) | RESPIRATORY_TRACT | Status: DC
  Administered 2018-01-05 – 2018-01-18 (×25): 0.5 mg via RESPIRATORY_TRACT
  Filled 2018-01-05 (×26): qty 2

## 2018-01-05 MED ORDER — DM-GUAIFENESIN ER 30-600 MG PO TB12
1.0000 | ORAL_TABLET | Freq: Two times a day (BID) | ORAL | Status: DC
  Administered 2018-01-05 – 2018-01-18 (×24): 1 via ORAL
  Filled 2018-01-05 (×26): qty 1

## 2018-01-05 MED ORDER — ACETAMINOPHEN 650 MG RE SUPP
650.0000 mg | Freq: Four times a day (QID) | RECTAL | Status: DC | PRN
  Administered 2018-01-10: 650 mg via RECTAL
  Filled 2018-01-05: qty 1

## 2018-01-05 MED ORDER — IOPAMIDOL (ISOVUE-370) INJECTION 76%
100.0000 mL | Freq: Once | INTRAVENOUS | Status: AC | PRN
  Administered 2018-01-05: 100 mL via INTRAVENOUS

## 2018-01-05 MED ORDER — PANTOPRAZOLE SODIUM 40 MG PO TBEC
40.0000 mg | DELAYED_RELEASE_TABLET | Freq: Every day | ORAL | Status: DC
  Administered 2018-01-05 – 2018-01-17 (×12): 40 mg via ORAL
  Filled 2018-01-05 (×12): qty 1

## 2018-01-05 MED ORDER — FENTANYL CITRATE (PF) 100 MCG/2ML IJ SOLN
50.0000 ug | Freq: Once | INTRAMUSCULAR | Status: AC
  Administered 2018-01-05: 50 ug via INTRAVENOUS
  Filled 2018-01-05: qty 2

## 2018-01-05 MED ORDER — TRAZODONE HCL 50 MG PO TABS
100.0000 mg | ORAL_TABLET | Freq: Every evening | ORAL | Status: DC | PRN
  Administered 2018-01-05 – 2018-01-08 (×4): 100 mg via ORAL
  Filled 2018-01-05 (×2): qty 2
  Filled 2018-01-05: qty 1
  Filled 2018-01-05: qty 2

## 2018-01-05 MED ORDER — METOPROLOL TARTRATE 25 MG PO TABS
25.0000 mg | ORAL_TABLET | Freq: Two times a day (BID) | ORAL | Status: DC
  Administered 2018-01-05 – 2018-01-09 (×8): 25 mg via ORAL
  Filled 2018-01-05 (×8): qty 1

## 2018-01-05 MED ORDER — ASPIRIN 325 MG PO TABS
325.0000 mg | ORAL_TABLET | Freq: Once | ORAL | Status: AC
  Administered 2018-01-05: 325 mg via ORAL
  Filled 2018-01-05: qty 1

## 2018-01-05 MED ORDER — NICOTINE 21 MG/24HR TD PT24
21.0000 mg | MEDICATED_PATCH | TRANSDERMAL | Status: DC
  Administered 2018-01-06 – 2018-01-11 (×6): 21 mg via TRANSDERMAL
  Filled 2018-01-05 (×7): qty 1

## 2018-01-05 MED ORDER — ALBUTEROL (5 MG/ML) CONTINUOUS INHALATION SOLN
INHALATION_SOLUTION | RESPIRATORY_TRACT | Status: AC
  Administered 2018-01-05: 10 mg/h
  Filled 2018-01-05: qty 20

## 2018-01-05 MED ORDER — ENSURE ENLIVE PO LIQD
237.0000 mL | Freq: Two times a day (BID) | ORAL | Status: DC
  Administered 2018-01-06 – 2018-01-18 (×18): 237 mL via ORAL

## 2018-01-05 MED ORDER — HEPARIN BOLUS VIA INFUSION
4000.0000 [IU] | Freq: Once | INTRAVENOUS | Status: AC
  Administered 2018-01-05: 4000 [IU] via INTRAVENOUS

## 2018-01-05 MED ORDER — BUSPIRONE HCL 5 MG PO TABS
7.5000 mg | ORAL_TABLET | Freq: Three times a day (TID) | ORAL | Status: DC
  Administered 2018-01-05 – 2018-01-11 (×16): 7.5 mg via ORAL
  Administered 2018-01-11: 15 mg via ORAL
  Administered 2018-01-12 – 2018-01-18 (×19): 7.5 mg via ORAL
  Filled 2018-01-05 (×9): qty 2
  Filled 2018-01-05: qty 1
  Filled 2018-01-05: qty 2
  Filled 2018-01-05: qty 1
  Filled 2018-01-05: qty 2
  Filled 2018-01-05: qty 1
  Filled 2018-01-05 (×2): qty 2
  Filled 2018-01-05: qty 1
  Filled 2018-01-05 (×4): qty 2
  Filled 2018-01-05: qty 1
  Filled 2018-01-05 (×5): qty 2
  Filled 2018-01-05: qty 1
  Filled 2018-01-05 (×6): qty 2
  Filled 2018-01-05: qty 1
  Filled 2018-01-05: qty 2

## 2018-01-05 MED ORDER — HEPARIN (PORCINE) 25000 UT/250ML-% IV SOLN
1050.0000 [IU]/h | INTRAVENOUS | Status: DC
  Administered 2018-01-05: 1000 [IU]/h via INTRAVENOUS
  Administered 2018-01-06: 1150 [IU]/h via INTRAVENOUS
  Filled 2018-01-05 (×4): qty 250

## 2018-01-05 MED ORDER — ATORVASTATIN CALCIUM 10 MG PO TABS
20.0000 mg | ORAL_TABLET | Freq: Every day | ORAL | Status: DC
  Administered 2018-01-05 – 2018-01-17 (×12): 20 mg via ORAL
  Filled 2018-01-05 (×2): qty 2
  Filled 2018-01-05 (×2): qty 1
  Filled 2018-01-05 (×5): qty 2
  Filled 2018-01-05: qty 1
  Filled 2018-01-05 (×2): qty 2

## 2018-01-05 MED ORDER — IPRATROPIUM-ALBUTEROL 0.5-2.5 (3) MG/3ML IN SOLN: 3.0000 mL | RESPIRATORY_TRACT | Status: DC | PRN

## 2018-01-05 MED ORDER — OXYCODONE-ACETAMINOPHEN 5-325 MG PO TABS
1.0000 | ORAL_TABLET | Freq: Three times a day (TID) | ORAL | Status: DC | PRN
  Administered 2018-01-06 – 2018-01-09 (×7): 1 via ORAL
  Filled 2018-01-05 (×7): qty 1

## 2018-01-05 MED ORDER — ALBUTEROL (5 MG/ML) CONTINUOUS INHALATION SOLN
5.0000 mg/h | INHALATION_SOLUTION | RESPIRATORY_TRACT | Status: DC
  Administered 2018-01-05: 5 mg/h via RESPIRATORY_TRACT
  Filled 2018-01-05: qty 20

## 2018-01-05 MED ORDER — ONDANSETRON HCL 4 MG/2ML IJ SOLN
4.0000 mg | Freq: Four times a day (QID) | INTRAMUSCULAR | Status: DC | PRN
  Administered 2018-01-08: 4 mg via INTRAVENOUS
  Filled 2018-01-05: qty 2

## 2018-01-05 NOTE — H&P (Signed)
History and Physical    [name] EYC:[id number] DOB: February 04, 1950 DOA: 01/05/2018  PCP: Shelbie Ammons, MD   Patient coming from: Home  I have personally briefly reviewed patient's old medical records in [place]  Chief Complaint: SOB, chest pain  HPI: [name] is a 67 y.o. male with medical history significant for COPD, aortic dissection, cardiac tamponade, myocardial infarction, HTN, presented to the ED with complaints of increasing difficulty breathing that started this morning.  Symptoms actually started with an anxiety attack, then he subsequently became short of breath, which is significantly worse with activity and he reports sharp central chest pain.  Chest pain is nonradiating.  Chest pain-free at this time. Patient on 3-1/2 L at baseline.  Last cigarette was 2 weeks ago he plans to quit he is on nicotine patch.  Patient was just discharged from the hospital yesterday after a 4-day hospital stay, managed for COPD exacerbation.  Reports he felt better until this morning.  Troponin on admission stable at 0.06.  Influenza negative.  Completed course of antibiotics with IV Levaquin.  ED Course: Tachycardic to 127.  Blood pressure systolic 149F to 026V.  O2 sats currently greater than 92% on 3-1/2 L.  Troponin elevated 0.16.  EKG with T wave inversions inferior leads.  CTA negative for PE.  EDP talked to cardiology on-call who was okay with admission here.  Aspirin was deferred as he has an allergy to aspirin.   Review of Systems: As per HPI all other systems reviewed and negative.  Past Medical History:  Diagnosis Date  . Anemia   . Anxiety   . Aortic dissection (HCC)    TYPE 3  . CAD (coronary artery disease)    multiple, most recently Jan-March 2017  . Cardiac tamponade   . Chronic bronchitis ([place])   . Chronic fatigue   . Chronic pain   . Constipation   . COPD (chronic obstructive pulmonary disease) ([place])   . Depression   . Dysphagia, oropharyngeal phase     . ETOH abuse   . GERD (gastroesophageal reflux disease)   . Hiatal hernia   . HTN (hypertension)   . Hypercholesterolemia   . Metabolic encephalopathy   . Opioid abuse ([place])   . Peptic ulcer disease 08/2010   EGD   . PNA (pneumonia)   . Schatzki's ring     Past Surgical History:  Procedure Laterality Date  . BACK SURGERY    . CABG X 6  07/31/2007   [facility]  . CARDIAC CATHETERIZATION    . CARDIAC CATHETERIZATION N/A 03/16/2015   Procedure: Coronary/Graft Angiography;  Surgeon: Sherren Mocha, MD;  Location: [facility];  Service: Cardiovascular;  Laterality: N/A;  . CARDIAC CATHETERIZATION N/A 03/16/2015   Procedure: Coronary Stent Intervention;  Surgeon: Sherren Mocha, MD;  Location: [facility];  Service: Cardiovascular;  Laterality: N/A;  . KNEE SURGERY Right    acl  . OLECRANON BURSECTOMY Left 08/27/2012   Procedure: EXCISION LEFT OLECRANON BURSA;  Surgeon: Sanjuana Kava, MD;  Location: [facility];  Service: Orthopedics;  Laterality: Left;  . OLECRANON BURSECTOMY Left 05/25/2013   Procedure:  Excision sinus tract and tissue left elbow ;  Surgeon: Sanjuana Kava, MD;  Location: [facility];  Service: Orthopedics;  Laterality: Left;  . ORIF SCAPULAR FRACTURE Right   . SHOULDER SURGERY Left   . STERNAL WIRE REMOVEAL  12/31/2007   [facility]     reports that he quit smoking 8 days  ago. His smoking use included cigarettes. He has a 20.00 pack-year smoking history. He quit smokeless tobacco use about 2 years ago. He reports that he does not drink alcohol or use drugs.  Allergies  Allergen Reactions  . Nsaids Shortness Of Breath  . Bee Venom Swelling    Neck swelling, passes out  . Codeine Other (See Comments)    Causes GI Upset    Family History  Problem Relation Age of Onset  . Heart disease Father   . Emphysema Paternal Grandmother        never smoked, worked in a [employer]  . Emphysema Paternal Grandfather        never smoked, worked in a [employer]   .  Stomach cancer Paternal Grandfather   . Asthma Sister     Prior to Admission medications   Medication Sig Start Date End Date Taking? Authorizing Provider  albuterol (PROVENTIL) (2.5 MG/3ML) 0.083% nebulizer solution Take 3 mLs (2.5 mg total) by nebulization every 6 (six) hours as needed for wheezing or shortness of breath. 01/04/18  Yes Barton Dubois, MD  albuterol (VENTOLIN HFA) 108 (90 Base) MCG/ACT inhaler Inhale 2 puffs into the lungs every 6 (six) hours as needed for wheezing or shortness of breath.   Yes [provider]  ALPRAZolam (XANAX) 0.25 MG tablet Take 1 tablet (0.25 mg total) by mouth 2 (two) times daily as needed for anxiety. 01/04/18 01/04/19 Yes Barton Dubois, MD  aspirin 81 MG chewable tablet Chew 1 tablet (81 mg total) by mouth daily. 04/10/15  Yes [name], MD  atorvastatin (LIPITOR) 20 MG tablet Take 20 mg by mouth daily. 03/17/17  Yes [provider]  benzonatate (TESSALON) 200 MG capsule Take 1 capsule (200 mg total) by mouth 3 (three) times daily as needed for cough. 01/04/18  Yes Barton Dubois, MD  busPIRone (BUSPAR) 7.5 MG tablet Take 1 tablet (7.5 mg total) by mouth 3 (three) times daily. 01/04/18  Yes Barton Dubois, MD  feeding supplement, ENSURE ENLIVE, (ENSURE ENLIVE) LIQD Take 237 mLs by mouth 2 (two) times daily between meals. 01/04/18  Yes Barton Dubois, MD  Fluticasone-Umeclidin-Vilant (TRELEGY ELLIPTA) 100-62.5-25 MCG/INH AEPB Inhale 1 puff into the lungs daily.   Yes [provider]  gabapentin (NEURONTIN) 300 MG capsule Take 300 mg by mouth 3 (three) times daily. 12/20/17  Yes [provider]  levothyroxine (SYNTHROID, LEVOTHROID) 50 MCG tablet Take 50 mcg by mouth daily. 03/18/17  Yes [provider]  metoprolol tartrate (LOPRESSOR) 25 MG tablet Take 1 tablet (25 mg total) by mouth 2 (two) times daily. 05/27/17  Yes Melrose Nakayama, MD  nicotine (NICODERM CQ - DOSED IN MG/24 HOURS) 21 mg/24hr patch Place  1 patch (21 mg total) onto the skin daily. 01/04/18 01/04/19 Yes Barton Dubois, MD  oxyCODONE-acetaminophen (PERCOCET/ROXICET) 5-325 MG tablet Take 1 tablet by mouth every 8 (eight) hours as needed for up to 7 days for severe pain. 01/04/18 01/11/18 Yes Barton Dubois, MD  OXYGEN Inhale 3.5 L into the lungs daily as needed (for shortness of breath). 3-3.5lpm 24/7    Yes [provider]  pantoprazole (PROTONIX) 40 MG tablet Take 1 tablet (40 mg total) by mouth at bedtime. 04/10/15  Yes [name], MD  predniSONE (DELTASONE) 20 MG tablet Take 3 tablets daily by mouth x1 day; then 2 tablets by mouth daily x2 days; then 1 tablet by mouth daily 3 days; then half tablet by mouth daily x3 days and stop prednisone. 01/04/18  Yes Barton Dubois, MD  traZODone (DESYREL) 100 MG tablet Take 1 tablet (100 mg total) by mouth at bedtime as needed for sleep. 01/04/18  Yes Barton Dubois, MD    Physical Exam: Vitals:   01/05/18 1359 01/05/18 1530 01/05/18 1600 01/05/18 1630  BP:  (!) 160/98 (!) 151/89 (!) 138/121  Pulse:   91 (!) 117  Resp:   (!) 27 18  Temp:      TempSrc:      SpO2: 92% 95% 95% 100%  Weight:      Height:        Constitutional: moderate difficulty breathing, no evident with activity or talking.  Patient very anxious, jittery, tremulous extremities. Vitals:   01/05/18 1359 01/05/18 1530 01/05/18 1600 01/05/18 1630  BP:  (!) 160/98 (!) 151/89 (!) 138/121  Pulse:   91 (!) 117  Resp:   (!) 27 18  Temp:      TempSrc:      SpO2: 92% 95% 95% 100%  Weight:      Height:       Eyes: PERRL, lids and conjunctivae normal ENMT: Mucous membranes are moist. Posterior pharynx clear of any exudate or lesions. Neck: normal, supple, no masses, no thyromegaly Respiratory: Globally reduced air entry.  Moderate increased work of breathing Cardiovascular: Regular rate and rhythm, no murmurs / rubs / gallops. No extremity edema. 2+ pedal pulses.   Abdomen: no tenderness, no masses palpated.  No hepatosplenomegaly. Bowel sounds positive.  Musculoskeletal: no clubbing / cyanosis. No joint deformity upper and lower extremities. Good ROM, no contractures. Normal muscle tone.  Skin: no rashes, lesions, ulcers. No induration Neurologic: CN 2-12 grossly intact.  Strength 5/5 in all 4.  Psychiatric: Very anxious.  Tremulous.  Alert and oriented x3.  Labs on Admission: I have personally reviewed following labs and imaging studies  CBC: Recent Labs  Lab 12/31/17 1208 01/01/18 0200 01/05/18 1226  WBC 9.4 9.7 13.1*  NEUTROABS 7.6  --  10.8*  HGB 12.5* 11.4* 13.1  HCT 38.6* 36.2* 43.0  MCV 98.2 98.4 101.7*  PLT 127* 127* 989   Basic Metabolic Panel: Recent Labs  Lab 12/31/17 1208 01/01/18 0200 01/04/18 0632 01/05/18 1226  NA 132* 133* 136 140  K 3.5 3.7 4.5 4.0  CL 97* 100 96* 98  CO2 24 25 33* 33*  GLUCOSE 119* 201* 154* 146*  BUN 11 13 43* 31*  CREATININE 0.80 0.83 1.12 0.87  CALCIUM 7.9* 7.7* 8.3* 8.7*   Liver Function Tests: Recent Labs  Lab 12/31/17 1208  AST 23  ALT 11  ALKPHOS 52  BILITOT 0.6  PROT 7.1  ALBUMIN 3.3*   Coagulation Profile: Recent Labs  Lab 12/31/17 1208  INR 0.99   Cardiac Enzymes: Recent Labs  Lab 12/31/17 1211 12/31/17 2002 01/01/18 0200 01/01/18 0827 01/05/18 1507  TROPONINI 0.06* 0.06* 0.06* 0.06* 0.16*   Urine analysis:    Component Value Date/Time   COLORURINE YELLOW 12/31/2017 2047   APPEARANCEUR CLEAR 12/31/2017 2047   LABSPEC 1.025 12/31/2017 2047   PHURINE 5.0 12/31/2017 2047   GLUCOSEU 250 (A) 12/31/2017 2047   HGBUR MODERATE (A) 12/31/2017 2047   BILIRUBINUR NEGATIVE 12/31/2017 2047   KETONESUR NEGATIVE 12/31/2017 2047   PROTEINUR 100 (A) 12/31/2017 2047   UROBILINOGEN 0.2 08/26/2012 0930   NITRITE NEGATIVE 12/31/2017 2047   LEUKOCYTESUR NEGATIVE 12/31/2017 2047    Radiological Exams on Admission: Ct Angio Chest Pe W And/or Wo Contrast  Result Date: 01/05/2018 CLINICAL DATA:  Increased  shortness of  breath since discharge from the hospital on 01/04/2018. EXAM: CT ANGIOGRAPHY CHEST WITH CONTRAST TECHNIQUE: Multidetector CT imaging of the chest was performed using the standard protocol during bolus administration of intravenous contrast. Multiplanar CT image reconstructions and MIPs were obtained to evaluate the vascular anatomy. CONTRAST:  193mL ISOVUE-370 IOPAMIDOL (ISOVUE-370) INJECTION 76% COMPARISON:  Formal chest obtained earlier today and chest CTA dated 04/03/2015. 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 There has been interval thrombosis of the false lumen of the dissection with the exception of a small amount of persistent opacified blood in the aortic arch superiorly on the left. No significant narrowing of the true lumen of the aorta is seen. Stable post CABG changes. Atheromatous calcifications, including the coronary arteries and aorta. Mediastinum/Nodes: No enlarged mediastinal, hilar, or axillary lymph nodes. Thyroid gland, trachea, and esophagus demonstrate no significant findings. Lungs/Pleura: The lungs remain mildly hyperexpanded with mild bullous changes. Minimal residual atelectasis or scarring at the right lung base posteriorly. No pleural fluid. Upper Abdomen: Aortic dissection extending into the proximal left renal artery, as described above. Musculoskeletal: Motion artifacts making it difficult to assess for rib and sternal fractures. Thoracic spine degenerative changes and mild scoliosis. Lower cervical spine degenerative changes. Review of the MIP images confirms the above findings. IMPRESSION: 1. Previously demonstrated Stanford type B dissection of the aorta beginning  just beyond the origin of the left subclavian artery and extending into the upper abdomen and into the proximal left renal artery, causing approximately 90+% stenosis of the proximal left renal artery. 2. Interval thrombosis of the false lumen of the dissection with the exception of a small amount of persistent opacified blood in the aortic arch superiorly on the left. 3. Mild changes of COPD. 4. Calcific coronary artery and aortic atherosclerosis and post CABG changes. Aortic Atherosclerosis (ICD10-I70.0) and Emphysema (ICD10-J43.9). Electronically Signed   By: Claudie Revering M.D.   On: 01/05/2018 15:40   Dg Chest Port 1 View  Result Date: 01/05/2018 CLINICAL DATA:  PT was just d/c yesterday afternoon for acute on chronic copd. became short of breath shortly after leaving yesterday. Hx pneumonia/htn/ex smoker/hx heart cath EXAM: PORTABLE CHEST 1 VIEW COMPARISON:  CT chest 04/03/2015 FINDINGS: The lungs are hyperinflated likely secondary to COPD. There is mild bilateral interstitial thickening likely chronic. There is no pleural effusion or pneumothorax. The heart and mediastinal contours are unremarkable. There is dilatation of the aortic arch. The osseous structures are unremarkable. IMPRESSION: No active disease. Dilatation of the aortic arch. Electronically Signed   By: Kathreen Devoid   On: 01/05/2018 12:20    EKG: Independently reviewed.  T wave inversions inferior leads change compared to prior.  QTc- 480.  Assessment/Plan Active Problems:   COPD exacerbation (HCC)  COPD exacerbation-increased difficulty breathing, but chronic unchanged minimally productive cough.  Exacerbation likely provoked by anxiety attack.  Patient very anxious, tremulous and jittery, continuous aibuterol nebs likely contributing.  CTA negative for PE.  No infiltrates.  WBC 13.1.  25 Solu-Medrol given in ED.-Duo nebs PRN, scheduled -Mucolytics, supplemental O2 - Will hold off on further antibiotics  - IV Solu-Medrol 60 twice  daily - ABG. -Encourage smoking cessation -Ativan 0.51 IV, cont home Xanax to  0.5 3 times daily as needed  Chest pain-with elevated troponin 0.16.  EKG with new T wave inversions inferior leads.  CAD history status post CABG- 2009.  EDP talked to cardiology okay with admission here. He has tolerated aspirin recent admission despite allergy. - Trend trop - EKG a.m -Echocardiogram -Aspirin, statins, continue home metoprolol. -Cardiology consult in a.m. -Start heparin GTT ACS protocol.  HTN-initially elevated likely from anxiety improved. -Continue home metoprolol  Anxiety -Continue new medications just started on recent admission BuSpar and trazodone, Xanax 0.25 3 times daily, may need to use adjustment if not adequate.  Descending thoracic aorta dissection -Follow-up as outpatient.  CTA chest not remarkable for concerning abnormalities (see detailed report)  DVT prophylaxis: Heparin GTT Code Status: Full Family Communication: None at bedside  disposition Plan: Per rounding team Consults called: Cardiology Admission status: Observation, telemetry   Nicosha Struve Arlyce Dice MD [facility] Pager [phone] From 3PM-11PM.  Otherwise please contact night-coverage [email or web address] Password [email or web address]  01/05/2018, 6:06 PM

## 2018-01-05 NOTE — Progress Notes (Signed)
ANTICOAGULATION CONSULT NOTE - Initial Consult  Pharmacy Consult for heparin gtt  Indication: chest pain/ACS  Allergies  Allergen Reactions  . Nsaids Shortness Of Breath  . Bee Venom Swelling    Neck swelling, passes out  . Codeine Other (See Comments)    Causes GI Upset    Patient Measurements: Height: 5\' 10"  (177.8 cm) Weight: 165 lb 5.5 oz (75 kg) IBW/kg (Calculated) : 73 Heparin Dosing Weight: HEPARIN DW (KG): 75   Vital Signs: Temp: 97.9 F (36.6 C) (12/16 1055) Temp Source: Axillary (12/16 1055) BP: 159/90 (12/16 1730) Pulse Rate: 98 (12/16 1730)  Labs: Recent Labs    01/04/18 0632 01/05/18 1226 01/05/18 1507  HGB  --  13.1  --   HCT  --  43.0  --   PLT  --  223  --   CREATININE 1.12 0.87  --   TROPONINI  --   --  0.16*    Estimated Creatinine Clearance: 86.2 mL/min (by C-G formula based on SCr of 0.87 mg/dL).   Medical History: Past Medical History:  Diagnosis Date  . Anemia   . Anxiety   . Aortic dissection (HCC)    TYPE 3  . CAD (coronary artery disease)    multiple, most recently Jan-March 2017  . Cardiac tamponade   . Chronic bronchitis ([place])   . Chronic fatigue   . Chronic pain   . Constipation   . COPD (chronic obstructive pulmonary disease) ([place])   . Depression   . Dysphagia, oropharyngeal phase   . ETOH abuse   . GERD (gastroesophageal reflux disease)   . Hiatal hernia   . HTN (hypertension)   . Hypercholesterolemia   . Metabolic encephalopathy   . Opioid abuse ([place])   . Peptic ulcer disease 08/2010   EGD   . PNA (pneumonia)   . Schatzki's ring     Medications:  (Not in a hospital admission)  Scheduled:  . aspirin  325 mg Oral Once  . heparin  4,000 Units Intravenous Once   Infusions:  . heparin     PRN: ipratropium-albuterol Anti-infectives (From admission, onward)   None      Assessment: [name] a 67 y.o. male requires anticoagulation with a heparin iv infusion for the indication of  chest pain/ACS. Heparin  gtt will be started following pharmacy protocol per pharmacy consult. Patient is not on previous oral anticoagulant that will require aPTT/HL correlation before transitioning to only HL monitoring.   Goal of Therapy:  Heparin level 0.3-0.7 units/ml Monitor platelets by anticoagulation protocol: Yes   Plan:  Give 4000 units bolus x 1 Start heparin infusion at 1000 units/hr Check anti-Xa level in 6 hours and daily while on heparin Continue to monitor H&H and platelets  Heparin level to be drawn in 6 hours. 8 hours for patients >64 years old or crcl < 42ml/min  [name] 01/05/2018,6:11 PM

## 2018-01-05 NOTE — ED Provider Notes (Signed)
[facility] EMERGENCY DEPARTMENT Provider Note   CSN: [id number] Arrival date & time: 01/05/18  1039   History   Chief Complaint Chief Complaint  Patient presents with  . Shortness of Breath    HPI [name] is a 67 y.o. male with PMH COPD and anxiety presenting with SOB. He was discharged from hospital yesterday, was admitted for COPD exacerbation. He reports he is usually on 3L O2 at home, had to turn it up to four today. He reports severe anxiety. He reports low back pain. He was discharged with rx for xanax but did not fill this prescription.  HPI  Past Medical History:  Diagnosis Date  . Anemia   . Anxiety   . Aortic dissection (HCC)    TYPE 3  . CAD (coronary artery disease)    multiple, most recently Jan-March 2017  . Cardiac tamponade   . Chronic bronchitis ([place])   . Chronic fatigue   . Chronic pain   . Constipation   . COPD (chronic obstructive pulmonary disease) ([place])   . Depression   . Dysphagia, oropharyngeal phase   . ETOH abuse   . GERD (gastroesophageal reflux disease)   . Hiatal hernia   . HTN (hypertension)   . Hypercholesterolemia   . Metabolic encephalopathy   . Opioid abuse ([place])   . Peptic ulcer disease 08/2010   EGD   . PNA (pneumonia)   . Schatzki's ring     Patient Active Problem List   Diagnosis Date Noted  . SIRS (systemic inflammatory response syndrome) (HCC)   . Acute on chronic respiratory failure with hypoxia ([place]) 12/31/2017  . Insomnia secondary to depression with anxiety 12/31/2017  . Chronic respiratory failure with hypoxia ([place]) 05/01/2016  . Dyspnea on exertion 04/30/2016  . Hypokalemia 11/17/2015  . Hyperglycemia 11/17/2015  . COPD exacerbation ([place]) 08/03/2015  . Orthostatic hypotension   . Acute kidney injury ([place])   . Altered mental status 05/11/2015  . AKI (acute kidney injury) ([place]) 05/11/2015  . Decubitus ulcer of back, stage 2 ([place]) 05/11/2015  . GERD without esophagitis 05/11/2015  . Moderate protein  malnutrition ([place]) 05/11/2015  . Disorientation 05/11/2015  . Malnutrition of moderate degree 04/04/2015  . Acute on chronic respiratory failure ([place])   . Aspiration into airway   . Descending thoracic dissection ([place])   . HCAP (healthcare-associated pneumonia)   . Lactic acidosis   . Tylenol toxicity   . Faintness   . NSTEMI (non-ST elevated myocardial infarction) ([place])   . Elevated troponin 03/14/2015  . Acute myocardial infarction, subendocardial infarction, subsequent episode of care ([place]) 10/26/2014  . Angina pectoris ([place]) 10/26/2014  . COPD clinical dx/ could not perforom spirometry  10/26/2014  . Arteriosclerosis of autologous vein coronary artery bypass graft 10/26/2014  . Arteriosclerosis of coronary artery 10/26/2014  . H/O adenomatous polyp of colon 10/26/2014  . Hypercholesterolemia without hypertriglyceridemia 10/26/2014  . Cigarette smoker 10/26/2014  . Depression 04/05/2013  . Cardiac tamponade   . Aortic dissection ([place])   . HTN (hypertension)   . CAD (coronary artery disease)   . Hypercholesterolemia   . Chronic back pain   . Schatzki's ring     Past Surgical History:  Procedure Laterality Date  . BACK SURGERY    . CABG X 6  07/31/2007   [facility]  . CARDIAC CATHETERIZATION    . CARDIAC CATHETERIZATION N/A 03/16/2015   Procedure: Coronary/Graft Angiography;  Surgeon: Sherren Mocha, MD;  Location: [facility];  Service:  Cardiovascular;  Laterality: N/A;  . CARDIAC CATHETERIZATION N/A 03/16/2015   Procedure: Coronary Stent Intervention;  Surgeon: Sherren Mocha, MD;  Location: [facility];  Service: Cardiovascular;  Laterality: N/A;  . KNEE SURGERY Right    acl  . OLECRANON BURSECTOMY Left 08/27/2012   Procedure: EXCISION LEFT OLECRANON BURSA;  Surgeon: Sanjuana Kava, MD;  Location: [facility];  Service: Orthopedics;  Laterality: Left;  . OLECRANON BURSECTOMY Left 05/25/2013   Procedure:  Excision sinus tract and tissue left elbow ;  Surgeon: Sanjuana Kava, MD;  Location: [facility];  Service: Orthopedics;  Laterality: Left;  . ORIF SCAPULAR FRACTURE Right   . SHOULDER SURGERY Left   . STERNAL WIRE REMOVEAL  12/31/2007   [facility]        Home Medications    Prior to Admission medications   Medication Sig Start Date End Date Taking? Authorizing Provider  albuterol (PROVENTIL) (2.5 MG/3ML) 0.083% nebulizer solution Take 3 mLs (2.5 mg total) by nebulization every 6 (six) hours as needed for wheezing or shortness of breath. 01/04/18  Yes Barton Dubois, MD  albuterol (VENTOLIN HFA) 108 (90 Base) MCG/ACT inhaler Inhale 2 puffs into the lungs every 6 (six) hours as needed for wheezing or shortness of breath.   Yes [provider]  ALPRAZolam (XANAX) 0.25 MG tablet Take 1 tablet (0.25 mg total) by mouth 2 (two) times daily as needed for anxiety. 01/04/18 01/04/19 Yes Barton Dubois, MD  aspirin 81 MG chewable tablet Chew 1 tablet (81 mg total) by mouth daily. 04/10/15  Yes [name], MD  atorvastatin (LIPITOR) 20 MG tablet Take 20 mg by mouth daily. 03/17/17  Yes [provider]  benzonatate (TESSALON) 200 MG capsule Take 1 capsule (200 mg total) by mouth 3 (three) times daily as needed for cough. 01/04/18  Yes Barton Dubois, MD  busPIRone (BUSPAR) 7.5 MG tablet Take 1 tablet (7.5 mg total) by mouth 3 (three) times daily. 01/04/18  Yes Barton Dubois, MD  feeding supplement, ENSURE ENLIVE, (ENSURE ENLIVE) LIQD Take 237 mLs by mouth 2 (two) times daily between meals. 01/04/18  Yes Barton Dubois, MD  Fluticasone-Umeclidin-Vilant (TRELEGY ELLIPTA) 100-62.5-25 MCG/INH AEPB Inhale 1 puff into the lungs daily.   Yes [provider]  gabapentin (NEURONTIN) 300 MG capsule Take 300 mg by mouth 3 (three) times daily. 12/20/17  Yes [provider]  levothyroxine (SYNTHROID, LEVOTHROID) 50 MCG tablet Take 50 mcg by mouth daily. 03/18/17  Yes [provider]  metoprolol tartrate (LOPRESSOR) 25 MG tablet Take 1  tablet (25 mg total) by mouth 2 (two) times daily. 05/27/17  Yes Melrose Nakayama, MD  nicotine (NICODERM CQ - DOSED IN MG/24 HOURS) 21 mg/24hr patch Place 1 patch (21 mg total) onto the skin daily. 01/04/18 01/04/19 Yes Barton Dubois, MD  oxyCODONE-acetaminophen (PERCOCET/ROXICET) 5-325 MG tablet Take 1 tablet by mouth every 8 (eight) hours as needed for up to 7 days for severe pain. 01/04/18 01/11/18 Yes Barton Dubois, MD  OXYGEN Inhale 3.5 L into the lungs daily as needed (for shortness of breath). 3-3.5lpm 24/7    Yes [provider]  pantoprazole (PROTONIX) 40 MG tablet Take 1 tablet (40 mg total) by mouth at bedtime. 04/10/15  Yes [name], MD  predniSONE (DELTASONE) 20 MG tablet Take 3 tablets daily by mouth x1 day; then 2 tablets by mouth daily x2 days; then 1 tablet by mouth daily 3 days; then half tablet by mouth daily x3 days and stop prednisone. 01/04/18  Yes Barton Dubois, MD  traZODone (DESYREL) 100 MG tablet Take 1 tablet (100 mg total) by mouth at bedtime as needed for sleep. 01/04/18  Yes Barton Dubois, MD    Family History Family History  Problem Relation Age of Onset  . Heart disease Father   . Emphysema Paternal Grandmother        never smoked, worked in a [employer]  . Emphysema Paternal Grandfather        never smoked, worked in a [employer]   . Stomach cancer Paternal Grandfather   . Asthma Sister     Social History Social History   Tobacco Use  . Smoking status: Former Smoker    Packs/day: 1.00    Years: 20.00    Pack years: 20.00    Types: Cigarettes    Last attempt to quit: 12/28/2017    Years since quitting: 0.0  . Smokeless tobacco: Former Systems developer    Quit date: 06/05/2015  . Tobacco comment: 04/30/16- smoking 1 cig per day//lmr  Substance Use Topics  . Alcohol use: No    Alcohol/week: 0.0 standard drinks    Comment: denies  . Drug use: No     Allergies   Nsaids; Bee venom; and Codeine   Review of Systems Review of Systems    Constitutional: Negative for appetite change, chills and fever.  HENT: Negative for congestion.   Eyes: Negative for visual disturbance.  Respiratory: Positive for shortness of breath and wheezing.   Cardiovascular: Negative for chest pain.  Gastrointestinal: Negative for abdominal pain.  Genitourinary: Negative for decreased urine volume and dysuria.  Musculoskeletal: Positive for back pain.  Skin: Negative for rash.  Psychiatric/Behavioral: The patient is nervous/anxious.      Physical Exam Updated Vital Signs BP (!) 187/107   Pulse (!) 109   Temp 97.9 F (36.6 C) (Axillary)   Resp 16   Ht 5\' 10"  (1.778 m)   Wt 75 kg   SpO2 92%   BMI 23.72 kg/m   Physical Exam Vitals signs and nursing note reviewed.  Constitutional:      General: He is in acute distress.     Appearance: He is not ill-appearing.  HENT:     Head: Normocephalic and atraumatic.     Mouth/Throat:     Mouth: Mucous membranes are moist.  Eyes:     Extraocular Movements: Extraocular movements intact.     Pupils: Pupils are equal, round, and reactive to light.  Neck:     Musculoskeletal: Neck supple.  Cardiovascular:     Rate and Rhythm: Tachycardia present.     Heart sounds: No murmur.  Pulmonary:     Effort: Tachypnea, accessory muscle usage and respiratory distress present.     Breath sounds: Wheezing present.  Abdominal:     Palpations: Abdomen is soft.     Tenderness: There is no abdominal tenderness.  Musculoskeletal:     Right lower leg: No edema.     Left lower leg: No edema.  Skin:    General: Skin is warm and dry.  Neurological:     Mental Status: He is alert.  Psychiatric:        Mood and Affect: Mood is anxious.      ED Treatments / Results  Labs (all labs ordered are listed, but only abnormal results are displayed) Labs Reviewed  CBC WITH DIFFERENTIAL/PLATELET - Abnormal; Notable for the following components:      Result Value   WBC 13.1 (*)  MCV 101.7 (*)    Neutro Abs  10.8 (*)    Abs Immature Granulocytes 0.16 (*)    All other components within normal limits  BASIC METABOLIC PANEL - Abnormal; Notable for the following components:   CO2 33 (*)    Glucose, Bld 146 (*)    BUN 31 (*)    Calcium 8.7 (*)    All other components within normal limits  I-STAT TROPONIN, ED - Abnormal; Notable for the following components:   Troponin i, poc 0.17 (*)    All other components within normal limits  TROPONIN I  BRAIN NATRIURETIC PEPTIDE  I-STAT VENOUS BLOOD GAS, ED    EKG EKG Interpretation  Date/Time:  Monday January 05 2018 10:56:09 EST Ventricular Rate:  127 PR Interval:    QRS Duration: 86 QT Interval:  330 QTC Calculation: 480 R Axis:   90 Text Interpretation:  Sinus tachycardia Borderline right axis deviation Consider left ventricular hypertrophy No STEMI.  Confirmed by Nanda Quinton [phone]) on 01/05/2018 1:17:40 PM   Radiology Dg Chest Port 1 View  Result Date: 01/05/2018 CLINICAL DATA:  PT was just d/c yesterday afternoon for acute on chronic copd. became short of breath shortly after leaving yesterday. Hx pneumonia/htn/ex smoker/hx heart cath EXAM: PORTABLE CHEST 1 VIEW COMPARISON:  CT chest 04/03/2015 FINDINGS: The lungs are hyperinflated likely secondary to COPD. There is mild bilateral interstitial thickening likely chronic. There is no pleural effusion or pneumothorax. The heart and mediastinal contours are unremarkable. There is dilatation of the aortic arch. The osseous structures are unremarkable. IMPRESSION: No active disease. Dilatation of the aortic arch. Electronically Signed   By: Kathreen Devoid   On: 01/05/2018 12:20    Procedures Procedures (including critical care time)  Medications Ordered in ED Medications  albuterol (PROVENTIL,VENTOLIN) solution continuous neb (5 mg/hr Nebulization New Bag/Given 01/05/18 1359)  iopamidol (ISOVUE-370) 76 % injection 100 mL (has no administration in time range)  sodium chloride 0.9 % bolus 500 mL  (500 mLs Intravenous New Bag/Given 01/05/18 1444)  fentaNYL (SUBLIMAZE) injection 50 mcg (has no administration in time range)  albuterol (PROVENTIL, VENTOLIN) (5 MG/ML) 0.5% continuous inhalation solution (10 mg/hr  Given 01/05/18 1104)  LORazepam (ATIVAN) injection 0.5 mg (0.5 mg Intravenous Given 01/05/18 1132)  morphine 4 MG/ML injection 4 mg (4 mg Intravenous Given 01/05/18 1249)     Initial Impression / Assessment and Plan / ED Course  I have reviewed the triage vital signs and the nursing notes.  Pertinent labs & imaging results that were available during my care of the patient were reviewed by me and considered in my medical decision making (see chart for details).     67 year old with COPD, anxiety presenting with SOB. Given duoneb and solumedrol, on CAT at time of my assessment. He is in mild respiratory distress and shaking, reporting severe anxiety. Will continue CAT, give ativan 0.5 mg IV to control anxiety.   1200 Patient with minimal improvement after Ativan, endorsing pain and anxiety. Breathing seems to have improved slightly on CAT. Will order 4 mg IV morphine for pain, air hunger. Screening labs and portable CXR ordered.   1400 patient continues to have wheezing, tremors, report anxiety and low back pain. Will repeat CAT and get CTA to r/o PE as he is persistently tachycardic, dyspneic, and has elevated troponin to 0.17. fentanyl ordered for pain.   CTA pending, patient to be admitted for COPD exacerbation once study has resulted. Care handed off to Dr. Reather Converse  Final Clinical Impressions(s) / ED Diagnoses   Final diagnoses:  COPD exacerbation [facility])  Anxiety    ED Discharge Orders    None       Steve Rattler, DO 01/05/18 1509    Elnora Morrison, MD 01/05/18 [phone]

## 2018-01-05 NOTE — ED Notes (Signed)
Respiratory notified of continuous neb. 

## 2018-01-05 NOTE — Progress Notes (Signed)
Patient complaint of chest pain but not chest pressure. Paged Mid-Level. Mid-Level ordered a 12 lead EKG.

## 2018-01-05 NOTE — ED Notes (Signed)
Date and time results received: 01/05/18 4:09 PM  (use smartphrase ".now" to insert current time)  Test: Troponin Critical Value: 0.16  Name of Provider Notified: Zavitz  Orders Received? Or Actions Taken?: Orders Received - See Orders for details

## 2018-01-05 NOTE — Progress Notes (Signed)
Patients current troponin is 0.12. Previous troponin was 0.16. Paged Mid Level with results.No new orders at this time. Will continue to monitor throughout shift.

## 2018-01-05 NOTE — ED Triage Notes (Signed)
PT was just d/c yesterday afternoon for acute on chronic copd.  Did not get rx filled, became short of breath shortly after leaving yesterday.  Used nebulizer prior to arrival, was given Duoneb enroute to ED.  Given Solumedrol 125mg  be ems.  Pt states he wants anxiety medication as well.

## 2018-01-06 ENCOUNTER — Observation Stay (HOSPITAL_BASED_OUTPATIENT_CLINIC_OR_DEPARTMENT_OTHER): Payer: Medicare PPO

## 2018-01-06 DIAGNOSIS — I214 Non-ST elevation (NSTEMI) myocardial infarction: Secondary | ICD-10-CM | POA: Diagnosis present

## 2018-01-06 DIAGNOSIS — I10 Essential (primary) hypertension: Secondary | ICD-10-CM | POA: Diagnosis present

## 2018-01-06 DIAGNOSIS — I2583 Coronary atherosclerosis due to lipid rich plaque: Secondary | ICD-10-CM | POA: Diagnosis not present

## 2018-01-06 DIAGNOSIS — I350 Nonrheumatic aortic (valve) stenosis: Secondary | ICD-10-CM

## 2018-01-06 DIAGNOSIS — E7800 Pure hypercholesterolemia, unspecified: Secondary | ICD-10-CM | POA: Diagnosis present

## 2018-01-06 DIAGNOSIS — J9600 Acute respiratory failure, unspecified whether with hypoxia or hypercapnia: Secondary | ICD-10-CM | POA: Diagnosis not present

## 2018-01-06 DIAGNOSIS — I252 Old myocardial infarction: Secondary | ICD-10-CM | POA: Diagnosis not present

## 2018-01-06 DIAGNOSIS — I7101 Dissection of thoracic aorta: Secondary | ICD-10-CM | POA: Diagnosis present

## 2018-01-06 DIAGNOSIS — D649 Anemia, unspecified: Secondary | ICD-10-CM | POA: Diagnosis not present

## 2018-01-06 DIAGNOSIS — J90 Pleural effusion, not elsewhere classified: Secondary | ICD-10-CM | POA: Diagnosis present

## 2018-01-06 DIAGNOSIS — J9621 Acute and chronic respiratory failure with hypoxia: Secondary | ICD-10-CM | POA: Diagnosis present

## 2018-01-06 DIAGNOSIS — Z886 Allergy status to analgesic agent status: Secondary | ICD-10-CM | POA: Diagnosis not present

## 2018-01-06 DIAGNOSIS — Z825 Family history of asthma and other chronic lower respiratory diseases: Secondary | ICD-10-CM | POA: Diagnosis not present

## 2018-01-06 DIAGNOSIS — Z885 Allergy status to narcotic agent status: Secondary | ICD-10-CM | POA: Diagnosis not present

## 2018-01-06 DIAGNOSIS — I209 Angina pectoris, unspecified: Secondary | ICD-10-CM | POA: Diagnosis present

## 2018-01-06 DIAGNOSIS — Z87891 Personal history of nicotine dependence: Secondary | ICD-10-CM | POA: Diagnosis not present

## 2018-01-06 DIAGNOSIS — R58 Hemorrhage, not elsewhere classified: Secondary | ICD-10-CM | POA: Diagnosis not present

## 2018-01-06 DIAGNOSIS — F419 Anxiety disorder, unspecified: Secondary | ICD-10-CM | POA: Diagnosis present

## 2018-01-06 DIAGNOSIS — G9340 Encephalopathy, unspecified: Secondary | ICD-10-CM | POA: Diagnosis not present

## 2018-01-06 DIAGNOSIS — D62 Acute posthemorrhagic anemia: Secondary | ICD-10-CM | POA: Diagnosis not present

## 2018-01-06 DIAGNOSIS — R0600 Dyspnea, unspecified: Secondary | ICD-10-CM | POA: Diagnosis not present

## 2018-01-06 DIAGNOSIS — Z8249 Family history of ischemic heart disease and other diseases of the circulatory system: Secondary | ICD-10-CM | POA: Diagnosis not present

## 2018-01-06 DIAGNOSIS — R578 Other shock: Secondary | ICD-10-CM | POA: Diagnosis not present

## 2018-01-06 DIAGNOSIS — F411 Generalized anxiety disorder: Secondary | ICD-10-CM | POA: Diagnosis present

## 2018-01-06 DIAGNOSIS — R5382 Chronic fatigue, unspecified: Secondary | ICD-10-CM | POA: Diagnosis present

## 2018-01-06 DIAGNOSIS — R7989 Other specified abnormal findings of blood chemistry: Secondary | ICD-10-CM | POA: Diagnosis not present

## 2018-01-06 DIAGNOSIS — Z91018 Allergy to other foods: Secondary | ICD-10-CM | POA: Diagnosis not present

## 2018-01-06 DIAGNOSIS — I2510 Atherosclerotic heart disease of native coronary artery without angina pectoris: Secondary | ICD-10-CM | POA: Diagnosis present

## 2018-01-06 DIAGNOSIS — Y92239 Unspecified place in hospital as the place of occurrence of the external cause: Secondary | ICD-10-CM | POA: Diagnosis not present

## 2018-01-06 DIAGNOSIS — J9811 Atelectasis: Secondary | ICD-10-CM | POA: Diagnosis not present

## 2018-01-06 DIAGNOSIS — J441 Chronic obstructive pulmonary disease with (acute) exacerbation: Secondary | ICD-10-CM | POA: Diagnosis present

## 2018-01-06 DIAGNOSIS — M879 Osteonecrosis, unspecified: Secondary | ICD-10-CM | POA: Diagnosis present

## 2018-01-06 DIAGNOSIS — R0789 Other chest pain: Secondary | ICD-10-CM

## 2018-01-06 DIAGNOSIS — J9601 Acute respiratory failure with hypoxia: Secondary | ICD-10-CM | POA: Diagnosis not present

## 2018-01-06 DIAGNOSIS — Z91030 Bee allergy status: Secondary | ICD-10-CM | POA: Diagnosis not present

## 2018-01-06 LAB — CBC
HEMATOCRIT: 40.1 % (ref 39.0–52.0)
Hemoglobin: 12.6 g/dL — ABNORMAL LOW (ref 13.0–17.0)
MCH: 31.2 pg (ref 26.0–34.0)
MCHC: 31.4 g/dL (ref 30.0–36.0)
MCV: 99.3 fL (ref 80.0–100.0)
Platelets: 230 10*3/uL (ref 150–400)
RBC: 4.04 MIL/uL — ABNORMAL LOW (ref 4.22–5.81)
RDW: 13.1 % (ref 11.5–15.5)
WBC: 11.4 10*3/uL — ABNORMAL HIGH (ref 4.0–10.5)
nRBC: 0 % (ref 0.0–0.2)

## 2018-01-06 LAB — ECHOCARDIOGRAM COMPLETE
Height: 70 in
Weight: 2366.86 oz

## 2018-01-06 LAB — HEPARIN LEVEL (UNFRACTIONATED)
Heparin Unfractionated: 0.48 IU/mL (ref 0.30–0.70)
Heparin Unfractionated: 0.22 IU/mL — ABNORMAL LOW (ref 0.30–0.70)
Heparin Unfractionated: 0.55 IU/mL (ref 0.30–0.70)

## 2018-01-06 LAB — TROPONIN I: Troponin I: 0.19 ng/mL (ref <0.03)

## 2018-01-06 MED ORDER — LOSARTAN POTASSIUM 50 MG PO TABS
50.0000 mg | ORAL_TABLET | Freq: Every day | ORAL | Status: DC
  Administered 2018-01-07 – 2018-01-09 (×3): 50 mg via ORAL
  Filled 2018-01-06 (×3): qty 1

## 2018-01-06 MED ORDER — ALPRAZOLAM 0.25 MG PO TABS: 0.2500 mg | ORAL_TABLET | Freq: Three times a day (TID) | ORAL | Status: DC | PRN

## 2018-01-06 MED ORDER — OXYCODONE-ACETAMINOPHEN 5-325 MG PO TABS
1.0000 | ORAL_TABLET | Freq: Once | ORAL | Status: AC
  Administered 2018-01-06: 1 via ORAL
  Filled 2018-01-06: qty 1

## 2018-01-06 MED ORDER — HYDRALAZINE HCL 25 MG PO TABS
25.0000 mg | ORAL_TABLET | Freq: Four times a day (QID) | ORAL | Status: DC | PRN
  Administered 2018-01-06: 25 mg via ORAL
  Filled 2018-01-06: qty 1

## 2018-01-06 MED ORDER — ALPRAZOLAM 0.25 MG PO TABS
0.2500 mg | ORAL_TABLET | Freq: Three times a day (TID) | ORAL | Status: DC | PRN
  Administered 2018-01-06 – 2018-01-17 (×19): 0.25 mg via ORAL
  Filled 2018-01-06 (×20): qty 1

## 2018-01-06 MED ORDER — METHYLPREDNISOLONE SODIUM SUCC 125 MG IJ SOLR
60.0000 mg | Freq: Four times a day (QID) | INTRAMUSCULAR | Status: DC
  Administered 2018-01-06 – 2018-01-07 (×4): 60 mg via INTRAVENOUS
  Filled 2018-01-06 (×4): qty 2

## 2018-01-06 NOTE — Progress Notes (Signed)
Dr. Darrick Meigs gave one time order for hydralazine. Refer to [facility] for dosage. Will continue to monitor throughout shift.

## 2018-01-06 NOTE — Progress Notes (Signed)
PROGRESS NOTE    [name]  BJS:[id number] DOB: 09-01-1950 DOA: 01/05/2018 PCP: Shelbie Ammons, MD    Brief Narrative:  67 year old male with a history of COPD, chronic aortic dissection, coronary artery disease, hypertension, presents to the hospital with complaints of shortness of breath.  He was noted to be extremely anxious, had shortness of breath and wheezing.  He was recently discharged from the hospital after being treated for COPD exacerbation.  He presented with tachycardia with a heart rate in the 120s, mild elevation of troponin and new T wave inversions on EKG.  He was admitted for further treatments.  Started on medical management for elevated troponin with cardiology following.  He is also being treated for COPD exacerbation.  Anticipate further cardiac work-up once respiratory status has stabilized.  Anxiety is also playing a role in his symptoms.   Assessment & Plan:   Active Problems:   HTN (hypertension)   CAD (coronary artery disease)   Hypercholesterolemia   Elevated troponin   Descending thoracic dissection (HCC)   COPD exacerbation (HCC)   Acute on chronic respiratory failure with hypoxia (HCC)   Anxiety   Ischemic chest pain ([place])   1. COPD exacerbation.  Continues to have evidence of wheezing.  Continue bronchodilators and IV steroids. 2. Acute on chronic respiratory failure.  Chronically on 3 to 4 L of oxygen.  Try and wean back down to home oxygen requirement.   3. Chest pain with EKG changes.  Patient has known coronary artery disease.  Noted to have new T wave versions in lateral leads.  Has mild elevation of troponin.  Cardiology following.  Currently on heparin infusion.  Echocardiogram has been done without any wall motion abnormalities.  Plan is to consider further ischemic testing once his respiratory status has stabilized. 4. Hypertension.  Continue on home dose metoprolol. 5. Anxiety.  Will continue on benzodiazepines as needed. 6. Type aortic  dissection.  Diagnosed in 2008.  Blood pressures are elevated.  Will consider adding losartan. 7. Hyperlipidemia.  Continue on statin   DVT prophylaxis: Heparin infusion Code Status: Full code Family Communication: No family present Disposition Plan: Discharge home once improved   Consultants:   Cardiology  Procedures:  Echo: - Left ventricle: The cavity size was normal. Wall thickness was   increased in a pattern of moderate LVH. Systolic function was   normal. The estimated ejection fraction was in the range of 55%   to 60%. - Aortic valve: Mildly calcified annulus. Mildly thickened   leaflets. Valve area (VTI): 2.26 cm^2. Valve area (Vmax): 2.26   cm^2. - Mitral valve: Mildly calcified annulus. Mildly thickened leaflets   .  - Technically difficult study.  Antimicrobials:      Subjective: Continues to feel short of breath.  Has wheezing and cough.  Objective: Vitals:   01/06/18 0617 01/06/18 0801 01/06/18 1359 01/06/18 1500  BP: (!) 141/112  (!) 183/107   Pulse: 79  87   Resp: 17  (!) 22   Temp: 98.2 F (36.8 C)  98.2 F (36.8 C)   TempSrc: Oral  Oral   SpO2: 100% 93% 100% 98%  Weight: 67.1 kg     Height:        Intake/Output Summary (Last 24 hours) at 01/06/2018 1739 Last data filed at 01/06/2018 1434 Gross per 24 hour  Intake 252.47 ml  Output 900 ml  Net -647.53 ml   Filed Weights   01/05/18 1101 01/05/18 1950 01/06/18 0617  Weight:  75 kg 67.1 kg 67.1 kg    Examination:  General exam: Appears calm and comfortable  Respiratory system: Bilateral wheezes.  Mild increased respiratory effort Cardiovascular system: S1 & S2 heard, RRR. No JVD, murmurs, rubs, gallops or clicks. No pedal edema. Gastrointestinal system: Abdomen is nondistended, soft and nontender. No organomegaly or masses felt. Normal bowel sounds heard. Central nervous system: Alert and oriented. No focal neurological deficits. Extremities: Symmetric 5 x 5 power. Skin: No rashes,  lesions or ulcers Psychiatry: Appears anxious.     Data Reviewed: I have personally reviewed following labs and imaging studies  CBC: Recent Labs  Lab 12/31/17 1208 01/01/18 0200 01/05/18 1226 01/06/18 0210  WBC 9.4 9.7 13.1* 11.4*  NEUTROABS 7.6  --  10.8*  --   HGB 12.5* 11.4* 13.1 12.6*  HCT 38.6* 36.2* 43.0 40.1  MCV 98.2 98.4 101.7* 99.3  PLT 127* 127* 223 614   Basic Metabolic Panel: Recent Labs  Lab 12/31/17 1208 01/01/18 0200 01/04/18 0632 01/05/18 1226  NA 132* 133* 136 140  K 3.5 3.7 4.5 4.0  CL 97* 100 96* 98  CO2 24 25 33* 33*  GLUCOSE 119* 201* 154* 146*  BUN 11 13 43* 31*  CREATININE 0.80 0.83 1.12 0.87  CALCIUM 7.9* 7.7* 8.3* 8.7*   GFR: Estimated Creatinine Clearance: 79.3 mL/min (by C-G formula based on SCr of 0.87 mg/dL). Liver Function Tests: Recent Labs  Lab 12/31/17 1208  AST 23  ALT 11  ALKPHOS 52  BILITOT 0.6  PROT 7.1  ALBUMIN 3.3*   No results for input(s): LIPASE, AMYLASE in the last 168 hours. No results for input(s): AMMONIA in the last 168 hours. Coagulation Profile: Recent Labs  Lab 12/31/17 1208  INR 0.99   Cardiac Enzymes: Recent Labs  Lab 01/01/18 0200 01/01/18 0827 01/05/18 1507 01/05/18 2047 01/06/18 0210  TROPONINI 0.06* 0.06* 0.16* 0.12* 0.19*   BNP (last 3 results) No results for input(s): PROBNP in the last 8760 hours. HbA1C: No results for input(s): HGBA1C in the last 72 hours. CBG: No results for input(s): GLUCAP in the last 168 hours. Lipid Profile: No results for input(s): CHOL, HDL, LDLCALC, TRIG, CHOLHDL, LDLDIRECT in the last 72 hours. Thyroid Function Tests: No results for input(s): TSH, T4TOTAL, FREET4, T3FREE, THYROIDAB in the last 72 hours. Anemia Panel: No results for input(s): VITAMINB12, FOLATE, FERRITIN, TIBC, IRON, RETICCTPCT in the last 72 hours. Sepsis Labs: Recent Labs  Lab 12/31/17 1211  LATICACIDVEN 1.15    Recent Results (from the past 240 hour(s))  Culture, blood  (Routine x 2)     Status: None   Collection Time: 12/31/17 12:08 PM  Result Value Ref Range Status   Specimen Description BLOOD LEFT FOREARM  Final   Special Requests   Final    BOTTLES DRAWN AEROBIC AND ANAEROBIC Blood Culture results may not be optimal due to an excessive volume of blood received in culture bottles   Culture   Final    NO GROWTH 5 DAYS Performed at [facility], [address] [place], [place] [address]    Report Status 01/05/2018 FINAL  Final  Culture, blood (Routine x 2)     Status: None   Collection Time: 12/31/17 12:14 PM  Result Value Ref Range Status   Specimen Description BLOOD LEFT HAND  Final   Special Requests   Final    BOTTLES DRAWN AEROBIC AND ANAEROBIC Blood Culture results may not be optimal due to an inadequate volume of blood received in  culture bottles   Culture   Final    NO GROWTH 5 DAYS Performed at [facility], [address], [place], [place] [address]    Report Status 01/05/2018 FINAL  Final  MRSA PCR Screening     Status: None   Collection Time: 01/05/18  7:53 PM  Result Value Ref Range Status   MRSA by PCR NEGATIVE NEGATIVE Final    Comment:        The GeneXpert MRSA Assay (FDA approved for NASAL specimens only), is one component of a comprehensive MRSA colonization surveillance program. It is not intended to diagnose MRSA infection nor to guide or monitor treatment for MRSA infections. Performed at [facility], [address], [place], [place] [address]          Radiology Studies: Ct Angio Chest Pe W And/or Wo Contrast  Result Date: 01/05/2018 CLINICAL DATA:  Increased shortness of breath since discharge from the hospital on 01/04/2018. EXAM: CT ANGIOGRAPHY CHEST WITH CONTRAST TECHNIQUE: Multidetector CT imaging of the chest was performed using the standard protocol during bolus administration of intravenous contrast. Multiplanar CT image reconstructions and MIPs were obtained to evaluate the vascular anatomy. CONTRAST:   146mL ISOVUE-370 IOPAMIDOL (ISOVUE-370) INJECTION 76% COMPARISON:  Formal chest obtained earlier today and chest CTA dated 04/03/2015. 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 There has been interval thrombosis of the false lumen of the dissection with the exception of a small amount of persistent opacified blood in the aortic arch superiorly on the left. No significant narrowing of the true lumen of the aorta is seen. Stable post CABG changes. Atheromatous calcifications, including the coronary arteries and aorta. Mediastinum/Nodes: No enlarged mediastinal, hilar, or axillary lymph nodes. Thyroid gland, trachea, and esophagus demonstrate no significant findings. Lungs/Pleura: The lungs remain mildly hyperexpanded with mild bullous changes. Minimal residual atelectasis or scarring at the right lung base posteriorly. No pleural fluid. Upper Abdomen: Aortic dissection extending into the proximal left renal artery, as described above. Musculoskeletal: Motion artifacts making it difficult to assess for rib and sternal fractures. Thoracic spine degenerative changes and mild scoliosis. Lower cervical spine degenerative changes. Review of the MIP images confirms the above findings. IMPRESSION: 1. 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 2. Interval thrombosis of the false lumen of the dissection with the exception of a small amount of persistent opacified blood in the aortic  arch superiorly on the left. 3. Mild changes of COPD. 4. Calcific coronary artery and aortic atherosclerosis and post CABG changes. Aortic Atherosclerosis (ICD10-I70.0) and Emphysema (ICD10-J43.9). Electronically Signed   By: Claudie Revering M.D.   On: 01/05/2018 15:40   Dg Chest Port 1 View  Result Date: 01/05/2018 CLINICAL DATA:  PT was just d/c yesterday afternoon for acute on chronic copd. became short of breath shortly after leaving yesterday. Hx pneumonia/htn/ex smoker/hx heart cath EXAM: PORTABLE CHEST 1 VIEW COMPARISON:  CT chest 04/03/2015 FINDINGS: The lungs are hyperinflated likely secondary to COPD. There is mild bilateral interstitial thickening likely chronic. There is no  pleural effusion or pneumothorax. The heart and mediastinal contours are unremarkable. There is dilatation of the aortic arch. The osseous structures are unremarkable. IMPRESSION: No active disease. Dilatation of the aortic arch. Electronically Signed   By: Kathreen Devoid   On: 01/05/2018 12:20        Scheduled Meds: . aspirin  81 mg Oral Daily  . atorvastatin  20 mg Oral q1800  . budesonide (PULMICORT) nebulizer solution  0.5 mg Nebulization BID  . busPIRone  7.5 mg Oral TID  . dextromethorphan-guaiFENesin  1 tablet Oral BID  . feeding supplement (ENSURE ENLIVE)  237 mL Oral BID BM  . gabapentin  300 mg Oral TID  . ipratropium-albuterol  3 mL Nebulization Q6H  . levothyroxine  50 mcg Oral Q0600  . methylPREDNISolone (SOLU-MEDROL) injection  60 mg Intravenous Q6H  . metoprolol tartrate  25 mg Oral BID  . nicotine  21 mg Transdermal Q24H  . pantoprazole  40 mg Oral QHS   Continuous Infusions: . heparin 1,150 Units/hr (01/06/18 1548)     LOS: 0 days    Time spent: 28mins    Kathie Dike, MD [facility] Pager [phone]  If 7PM-7AM, please contact night-coverage [email or web address] Password [facility] 01/06/2018, 5:39 PM

## 2018-01-06 NOTE — Progress Notes (Signed)
[place] for heparin Indication: chest pain/ACS  Allergies  Allergen Reactions  . Nsaids Shortness Of Breath  . Bee Venom Swelling    Neck swelling, passes out  . Codeine Other (See Comments)    Causes GI Upset    Patient Measurements: Height: 5\' 10"  (177.8 cm) Weight: 147 lb 14.9 oz (67.1 kg) IBW/kg (Calculated) : 73 Heparin Dosing Weight: HEPARIN DW (KG): 67.1   Vital Signs: Temp: 98.2 F (36.8 C) (12/17 1359) Temp Source: Oral (12/17 1359) BP: 183/107 (12/17 1359) Pulse Rate: 87 (12/17 1359)  Labs: Recent Labs    01/04/18 0632 01/05/18 1226 01/05/18 1507 01/05/18 2047 01/06/18 0210 01/06/18 0537 01/06/18 1308  HGB  --  13.1  --   --  12.6*  --   --   HCT  --  43.0  --   --  40.1  --   --   PLT  --  223  --   --  230  --   --   HEPARINUNFRC  --   --   --   --  0.48 0.22* 0.55  CREATININE 1.12 0.87  --   --   --   --   --   TROPONINI  --   --  0.16* 0.12* 0.19*  --   --     Estimated Creatinine Clearance: 79.3 mL/min (by C-G formula based on SCr of 0.87 mg/dL).  Assessment: Initial heparin level was therapeutic at 0.48 units/ml then 0.22 units/ml.  Some bleeding from IV site but otherwise no complications noted. Heparin level is therapeutic now.    Goal of Therapy:  Heparin level 0.3-0.7 units/ml Monitor platelets by anticoagulation protocol: Yes   Plan:  Continue heparin infusion at 1150 units/hr Check anti-Xa level daily while on heparin Continue to monitor H&H and platelets  Isac Sarna, BS Vena [place], BCPS Clinical Pharmacist Pager [phone] 01/06/2018,3:14 PM

## 2018-01-06 NOTE — Consult Note (Addendum)
Cardiology Consult    Patient ID: [name]; [id number]; June 26, 1950   Admit date: 01/05/2018 Date of Consult: 01/06/2018  Primary Care Provider: Shelbie Ammons, MD Primary Cardiologist: Candee Furbish, MD    Patient Profile    [name] is a 67 y.o. male with past medical history of CAD (s/p CABG in 2009 with cath in 02/2008 showing occluded SVG-PDA and SVG-OM, repeat cath in 02/2015 showing patency of LIMA-LAD, SVG-D1, and free RIMA-Ramus and PCI/DES of native RCA), Type B aortic dissection, chronic hypoxic respiratory failure (on 3-4L [place] at baseline), HLD, and orthostatic hypotension  who is being seen today for the evaluation of chest pain and elevated troponin values at the request of Dr. Denton Brick.   History of Present Illness    [name] was last examined by [name] in 06/2015 and reported constant chest discomfort 24/7 and had recently been prescribed a Fentanyl patch by pain management. He was noted to be depressed and was encouraged to follow-up with his PCP. No changes were made to his medication regimen at that time and he has not been evaluated by Cardiology since.  He was recently admitted to [facility] from 12/31/2017 to 01/04/2018 for acute on chronic hypoxic respiratory failure in the setting of a COPD exacerbation. Troponin values were obtained that admission and remained flat at 0.06 and given that he denied any recent chest pain, no further cardiac evaluation was pursued.  He presented back to the ED on 01/05/2018 for worsening dyspnea. In talking with the patient today, it is difficult to obtain a detailed history as he reports "hurting all over" and is requesting pain medication multiple times. He reports having severe shortness of breath which was occurring last admission and worsened upon returning home. Notes feeling very anxious about this and was recently given Xanax earlier this morning. He has a tremor on examination which resolves once he attempts to relax  and focus on his breathing. He reports having chronic lower back pain along with pain along his abdomen. No recent constipation or diarrhea. Does note his chest hurts when taking a deep breath. Denies any recent orthopnea, PND, or lower extremity edema.   Initial labs show WBC 13.1, Hgb 13.1, platelets 223, Na+ 140, K+ 4.0, and creatinine 0.87.  BNP elevated to 422.  Initial and cyclic troponin values have been elevated to 0.16, 0.12, and 0.19.  EKG showed sinus tachycardia, HR 127, with TWI along inferolateral leads. CXR shows no active cardiopulmonary disease. CTA showing evidence of previously noted type B dissection of the aorta beginning just beyond the origin of the left subclavian artery and extending into the upper abdomen and into the proximal left renal artery, causing approximately 90+% stenosis of the proximal left renal artery, interval thrombosis of the false lumen of the dissection, COPD, and known coronary calcifications.   Past Medical History:  Diagnosis Date  . Anemia   . Anxiety   . Aortic dissection (HCC)    TYPE 3  . CAD (coronary artery disease)    multiple, most recently Jan-March 2017  . Cardiac tamponade   . Chronic bronchitis ([place])   . Chronic fatigue   . Chronic pain   . Constipation   . COPD (chronic obstructive pulmonary disease) ([place])   . Depression   . Dysphagia, oropharyngeal phase   . ETOH abuse   . GERD (gastroesophageal reflux disease)   . Hiatal hernia   . HTN (hypertension)   . Hypercholesterolemia   .  Metabolic encephalopathy   . Opioid abuse ([place])   . Peptic ulcer disease 08/2010   EGD   . PNA (pneumonia)   . Schatzki's ring     Past Surgical History:  Procedure Laterality Date  . BACK SURGERY    . CABG X 6  07/31/2007   [facility]  . CARDIAC CATHETERIZATION    . CARDIAC CATHETERIZATION N/A 03/16/2015   Procedure: Coronary/Graft Angiography;  Surgeon: Sherren Mocha, MD;  Location: [place] [facility];  Service: Cardiovascular;   Laterality: N/A;  . CARDIAC CATHETERIZATION N/A 03/16/2015   Procedure: Coronary Stent Intervention;  Surgeon: Sherren Mocha, MD;  Location: [place] [facility];  Service: Cardiovascular;  Laterality: N/A;  . KNEE SURGERY Right    acl  . OLECRANON BURSECTOMY Left 08/27/2012   Procedure: EXCISION LEFT OLECRANON BURSA;  Surgeon: Sanjuana Kava, MD;  Location: [facility];  Service: Orthopedics;  Laterality: Left;  . OLECRANON BURSECTOMY Left 05/25/2013   Procedure:  Excision sinus tract and tissue left elbow ;  Surgeon: Sanjuana Kava, MD;  Location: [facility];  Service: Orthopedics;  Laterality: Left;  . ORIF SCAPULAR FRACTURE Right   . SHOULDER SURGERY Left   . STERNAL WIRE REMOVEAL  12/31/2007   [facility]     Home Medications:  Prior to Admission medications   Medication Sig Start Date End Date Taking? Authorizing Provider  albuterol (PROVENTIL) (2.5 MG/3ML) 0.083% nebulizer solution Take 3 mLs (2.5 mg total) by nebulization every 6 (six) hours as needed for wheezing or shortness of breath. 01/04/18  Yes Barton Dubois, MD  albuterol (VENTOLIN HFA) 108 (90 Base) MCG/ACT inhaler Inhale 2 puffs into the lungs every 6 (six) hours as needed for wheezing or shortness of breath.   Yes [provider]  ALPRAZolam (XANAX) 0.25 MG tablet Take 1 tablet (0.25 mg total) by mouth 2 (two) times daily as needed for anxiety. 01/04/18 01/04/19 Yes Barton Dubois, MD  aspirin 81 MG chewable tablet Chew 1 tablet (81 mg total) by mouth daily. 04/10/15  Yes [name], MD  atorvastatin (LIPITOR) 20 MG tablet Take 20 mg by mouth daily. 03/17/17  Yes [provider]  benzonatate (TESSALON) 200 MG capsule Take 1 capsule (200 mg total) by mouth 3 (three) times daily as needed for cough. 01/04/18  Yes Barton Dubois, MD  busPIRone (BUSPAR) 7.5 MG tablet Take 1 tablet (7.5 mg total) by mouth 3 (three) times daily. 01/04/18  Yes Barton Dubois, MD  feeding supplement, ENSURE ENLIVE, (ENSURE ENLIVE) LIQD Take 237  mLs by mouth 2 (two) times daily between meals. 01/04/18  Yes Barton Dubois, MD  Fluticasone-Umeclidin-Vilant (TRELEGY ELLIPTA) 100-62.5-25 MCG/INH AEPB Inhale 1 puff into the lungs daily.   Yes [provider]  gabapentin (NEURONTIN) 300 MG capsule Take 300 mg by mouth 3 (three) times daily. 12/20/17  Yes [provider]  levothyroxine (SYNTHROID, LEVOTHROID) 50 MCG tablet Take 50 mcg by mouth daily. 03/18/17  Yes [provider]  metoprolol tartrate (LOPRESSOR) 25 MG tablet Take 1 tablet (25 mg total) by mouth 2 (two) times daily. 05/27/17  Yes Melrose Nakayama, MD  nicotine (NICODERM CQ - DOSED IN MG/24 HOURS) 21 mg/24hr patch Place 1 patch (21 mg total) onto the skin daily. 01/04/18 01/04/19 Yes Barton Dubois, MD  oxyCODONE-acetaminophen (PERCOCET/ROXICET) 5-325 MG tablet Take 1 tablet by mouth every 8 (eight) hours as needed for up to 7 days for severe pain. 01/04/18 01/11/18 Yes Barton Dubois, MD  OXYGEN Inhale 3.5 L into the lungs daily  as needed (for shortness of breath). 3-3.5lpm 24/7    Yes [provider]  pantoprazole (PROTONIX) 40 MG tablet Take 1 tablet (40 mg total) by mouth at bedtime. 04/10/15  Yes [name], MD  predniSONE (DELTASONE) 20 MG tablet Take 3 tablets daily by mouth x1 day; then 2 tablets by mouth daily x2 days; then 1 tablet by mouth daily 3 days; then half tablet by mouth daily x3 days and stop prednisone. 01/04/18  Yes Barton Dubois, MD  traZODone (DESYREL) 100 MG tablet Take 1 tablet (100 mg total) by mouth at bedtime as needed for sleep. 01/04/18  Yes Barton Dubois, MD    Inpatient Medications: Scheduled Meds: . aspirin  81 mg Oral Daily  . atorvastatin  20 mg Oral q1800  . budesonide (PULMICORT) nebulizer solution  0.5 mg Nebulization BID  . busPIRone  7.5 mg Oral TID  . dextromethorphan-guaiFENesin  1 tablet Oral BID  . feeding supplement (ENSURE ENLIVE)  237 mL Oral BID BM  . gabapentin  300 mg Oral TID  .  ipratropium-albuterol  3 mL Nebulization Q6H  . levothyroxine  50 mcg Oral Q0600  . methylPREDNISolone (SOLU-MEDROL) injection  60 mg Intravenous Q12H  . metoprolol tartrate  25 mg Oral BID  . nicotine  21 mg Transdermal Q24H  . pantoprazole  40 mg Oral QHS   Continuous Infusions: . heparin 1,150 Units/hr (01/06/18 0643)   PRN Meds: acetaminophen **OR** acetaminophen, ALPRAZolam, hydrALAZINE, ipratropium-albuterol, ondansetron **OR** ondansetron (ZOFRAN) IV, oxyCODONE-acetaminophen, polyethylene glycol, traZODone  Allergies:    Allergies  Allergen Reactions  . Nsaids Shortness Of Breath  . Bee Venom Swelling    Neck swelling, passes out  . Codeine Other (See Comments)    Causes GI Upset    Social History:   Social History   Socioeconomic History  . Marital status: Married    Spouse name: Not on file  . Number of children: Not on file  . Years of education: Not on file  . Highest education level: Not on file  Occupational History  . Occupation: Dealer  Social Needs  . Financial resource strain: Not on file  . Food insecurity:    Worry: Not on file    Inability: Not on file  . Transportation needs:    Medical: Not on file    Non-medical: Not on file  Tobacco Use  . Smoking status: Former Smoker    Packs/day: 1.00    Years: 20.00    Pack years: 20.00    Types: Cigarettes    Last attempt to quit: 12/28/2017    Years since quitting: 0.0  . Smokeless tobacco: Former Systems developer    Quit date: 06/05/2015  . Tobacco comment: 04/30/16- smoking 1 cig per day//lmr  Substance and Sexual Activity  . Alcohol use: No    Alcohol/week: 0.0 standard drinks    Comment: denies  . Drug use: No  . Sexual activity: Not on file  Lifestyle  . Physical activity:    Days per week: Not on file    Minutes per session: Not on file  . Stress: Not on file  Relationships  . Social connections:    Talks on phone: Not on file    Gets together: Not on file    Attends religious service: Not on  file    Active member of club or organization: Not on file    Attends meetings of clubs or organizations: Not on file    Relationship status: Not on file  .  Intimate partner violence:    Fear of current or ex partner: Not on file    Emotionally abused: Not on file    Physically abused: Not on file    Forced sexual activity: Not on file  Other Topics Concern  . Not on file  Social History Narrative            Family History:    Family History  Problem Relation Age of Onset  . Heart disease Father   . Emphysema Paternal Grandmother        never smoked, worked in a [employer]  . Emphysema Paternal Grandfather        never smoked, worked in a [employer]   . Stomach cancer Paternal Grandfather   . Asthma Sister       Review of Systems    General:  No chills, fever, night sweats or weight changes.  Cardiovascular:  No edema, orthopnea, palpitations, paroxysmal nocturnal dyspnea. Positive for chest pain and dyspnea on exertion.   Dermatological: No rash, lesions/masses Respiratory: Positive for productive cough and dyspnea. Urologic: No hematuria, dysuria Abdominal:   No nausea, vomiting, diarrhea, bright red blood per rectum, melena, or hematemesis Neurologic:  No visual changes, wkns, changes in mental status. All other systems reviewed and are otherwise negative except as noted above.  Physical Exam/Data    Vitals:   01/06/18 0413 01/06/18 0449 01/06/18 0617 01/06/18 0801  BP: (!) 165/124 (!) 170/94 (!) 141/112   Pulse: 84  79   Resp:   17   Temp:   98.2 F (36.8 C)   TempSrc:   Oral   SpO2:   100% 93%  Weight:   67.1 kg   Height:        Intake/Output Summary (Last 24 hours) at 01/06/2018 0905 Last data filed at 01/06/2018 0400 Gross per 24 hour  Intake 632.47 ml  Output -  Net 632.47 ml   Filed Weights   01/05/18 1101 01/05/18 1950 01/06/18 0617  Weight: 75 kg 67.1 kg 67.1 kg   Body mass index is 21.23 kg/m.   General: Caucasian male, appearing  anxious with tremor along right upper extremity.  Psych: Normal affect. Neuro: Alert and oriented X 3. Moves all extremities spontaneously. HEENT: Normal  Neck: Supple without bruits or JVD. Lungs:  Resp regular and unlabored, expiratory wheeze along upper lung fields. Heart: RRR no s3, s4, or murmurs. Abdomen: Soft, non-tender, non-distended, BS + x 4.  Extremities: No clubbing, cyanosis or lower extremity edema. DP/PT/Radials 2+ and equal bilaterally.   EKG:  The EKG was personally reviewed and demonstrates:  Sinus tachycardia, HR 127, with TWI along inferolateral leads.  Telemetry:  Telemetry was personally reviewed and demonstrates: NSR, HR in 70's to 90's with occasional PVC's.    Labs/Studies     Relevant CV Studies:  Cardiac Catheterization: 03/16/2015  SVG .  100% occluded  Origin lesion, 100% stenosed.  Ost RCA to Prox RCA lesion, 50% stenosed.  SVG .  This is a sequential graft with an SVG-diagonal and free RIMA that arises from the body of the SVG and goes to the ramus intermedius. The graft is patent throughout  Mid RCA lesion, 30% stenosed.  LIMA .  Patent graft  SVG .  Chronically occluded  Prox Graft lesion, 100% stenosed.  LM lesion, 50% stenosed.  Prox LAD lesion, 100% stenosed.  Prox Cx lesion, 99% stenosed.  Dist RCA lesion, 95% stenosed. Post intervention, there is a 0% residual stenosis.  1. Severe 3 vessel CAD 2. S/p CABG with continued patency of the LIMA-LAD, SVG-diagonal, and free RIMA-ramus 3. Chronic occlusion of the SVG-PDA and SVG-OM 4. Successful PCI of critical stenosis in the native RCA using a DES platform  ASA/Brilinta x 12 months as tolerated. Should be ok for hospital discharge tomorrow as long as no complications arise.    Echocardiogram: 03/2015 Study Conclusions  - Left ventricle: The cavity size was normal. Wall thickness was   increased in a pattern of mild LVH. Systolic function was normal.   The  estimated ejection fraction was in the range of 55% to 60%.   Normal GLPSS at -19%. Wall motion was normal; there were no   regional wall motion abnormalities. Doppler parameters are   consistent with abnormal left ventricular relaxation (grade 1   diastolic dysfunction). The E/e&' ratio is between 8-15,   suggesting indeterminate LV filling pressure. - Aortic valve: Trileaflet. Sclerosis without stenosis. There was   no regurgitation. - Left atrium: The atrium was normal in size. - Atrial septum: Aneurysmal IAS - cannot exclude PFO. - Inferior vena cava: The vessel was normal in size. The   respirophasic diameter changes were in the normal range (= 50%),   consistent with normal central venous pressure. - Pericardium, extracardiac: There was no pericardial effusion.  Impressions:  - LVEF 55-60%, mild LVH, normal wall motion, diastolic dysfunction   with indeterminate LV filling pressure, normal LA size, no   pericardial effusion, normal caliber ascending and descending   aorta partially visualized without obvious dissection flap.  Laboratory Data:  Chemistry Recent Labs  Lab 01/01/18 0200 01/04/18 0632 01/05/18 1226  NA 133* 136 140  K 3.7 4.5 4.0  CL 100 96* 98  CO2 25 33* 33*  GLUCOSE 201* 154* 146*  BUN 13 43* 31*  CREATININE 0.83 1.12 0.87  CALCIUM 7.7* 8.3* 8.7*  GFRNONAA >60 >60 >60  GFRAA >60 >60 >60  ANIONGAP 8 7 9     Recent Labs  Lab 12/31/17 1208  PROT 7.1  ALBUMIN 3.3*  AST 23  ALT 11  ALKPHOS 52  BILITOT 0.6   Hematology Recent Labs  Lab 01/01/18 0200 01/05/18 1226 01/06/18 0210  WBC 9.7 13.1* 11.4*  RBC 3.68* 4.23 4.04*  HGB 11.4* 13.1 12.6*  HCT 36.2* 43.0 40.1  MCV 98.4 101.7* 99.3  MCH 31.0 31.0 31.2  MCHC 31.5 30.5 31.4  RDW 13.3 13.2 13.1  PLT 127* 223 230   Cardiac Enzymes Recent Labs  Lab 12/31/17 2002 01/01/18 0200 01/01/18 0827 01/05/18 1507 01/05/18 2047 01/06/18 0210  TROPONINI 0.06* 0.06* 0.06* 0.16* 0.12* 0.19*      Recent Labs  Lab 01/05/18 1231  TROPIPOC 0.17*    BNP Recent Labs  Lab 01/05/18 1226  BNP 422.0*    DDimer No results for input(s): DDIMER in the last 168 hours.  Radiology/Studies:  Ct Angio Chest Pe W And/or Wo Contrast  Result Date: 01/05/2018 CLINICAL DATA:  Increased shortness of breath since discharge from the hospital on 01/04/2018. EXAM: CT ANGIOGRAPHY CHEST WITH CONTRAST TECHNIQUE: Multidetector CT imaging of the chest was performed using the standard protocol during bolus administration of intravenous contrast. Multiplanar CT image reconstructions and MIPs were obtained to evaluate the vascular anatomy. CONTRAST:  115mL ISOVUE-370 IOPAMIDOL (ISOVUE-370) INJECTION 76% COMPARISON:  Formal chest obtained earlier today and chest CTA dated 04/03/2015. FINDINGS: Cardiovascular: Previously demonstrated Stanford type B dissection of the aorta beginning just beyond the origin of the left subclavian artery  and extending into the upper abdomen to the level of the origin of the renal arteries with extension into the proximal left renal artery, causing approximately 90+% stenosis of the proximal left renal artery. There has been interval thrombosis of the false lumen of the dissection with the exception of a small amount of persistent opacified blood in the aortic arch superiorly on the left. No significant narrowing of the true lumen of the aorta is seen. Stable post CABG changes. Atheromatous calcifications, including the coronary arteries and aorta. Mediastinum/Nodes: No enlarged mediastinal, hilar, or axillary lymph nodes. Thyroid gland, trachea, and esophagus demonstrate no significant findings. Lungs/Pleura: The lungs remain mildly hyperexpanded with mild bullous changes. Minimal residual atelectasis or scarring at the right lung base posteriorly. No pleural fluid. Upper Abdomen: Aortic dissection extending into the proximal left renal artery, as described above. Musculoskeletal: Motion  artifacts making it difficult to assess for rib and sternal fractures. Thoracic spine degenerative changes and mild scoliosis. Lower cervical spine degenerative changes. Review of the MIP images confirms the above findings. IMPRESSION: 1. 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 2. Interval thrombosis of the false lumen of the dissection with the exception of a small amount of persistent opacified blood in the aortic arch superiorly on the left. 3. Mild changes of COPD. 4. Calcific coronary artery and aortic atherosclerosis and post CABG changes. Aortic Atherosclerosis (ICD10-I70.0) and Emphysema (ICD10-J43.9). Electronically Signed   By: Claudie Revering M.D.   On: 01/05/2018 15:40   Dg Chest Port 1 View  Result Date: 01/05/2018 CLINICAL DATA:  PT was just d/c yesterday afternoon for acute on chronic copd. became short of breath shortly after leaving yesterday. Hx pneumonia/htn/ex smoker/hx heart cath EXAM: PORTABLE CHEST 1 VIEW COMPARISON:  CT chest 04/03/2015 FINDINGS: The lungs are hyperinflated likely secondary to COPD. There is mild bilateral interstitial thickening likely chronic. There is no pleural effusion or pneumothorax. The heart and mediastinal contours are unremarkable. There is dilatation of the aortic arch. The osseous structures are unremarkable. IMPRESSION: No active disease. Dilatation of the aortic arch. Electronically Signed   By: Kathreen Devoid   On: 01/05/2018 12:20     Assessment & Plan    1. Chest Pain/ Elevated Troponin Values - presented with worsening dyspnea after having recently been discharged for a COPD exacerbation. He tells me he "hurts all over" due to having chronic back pain and abdominal pain. Does mention also having chest discomfort when  coughing but denies any chest pain at this time. He does have known CAD as outlined above with cyclic troponin values being flat at 0.16, 0.12, and 0.19 this admission. His most recent EKG does show LVH with TWI along the inferolateral leads which is new when compared to prior tracings earlier this month. He has been started on Heparin and a repeat echo has been ordered to assess LV function and wall motion. Given his elevated enzymes (had been flat at 0.04 - 0.06 during prior admissions) and EKG changes, his clinical picture is concerning for ischemic changes. However, at the time of this encounter, he is severely anxious and tremulous requesting his PTA Oxycodone. Pending echo results and improvement in his respiratory status and anxiety, may need to consider a repeat catheterization for definitve evaluation. Continue Heparin along with ASA, statin, and  BB therapy.   2. CAD - s/p CABG in 2009 with cath in 02/2008 showing occluded SVG-PDA and SVG-OM, repeat cath in 02/2015 showing patency of LIMA-LAD, SVG-D1, and free RIMA-Ramus with PCI/DES of native RCA as outlined above. - repeat echo is pending to assess LV function.  - continue ASA, statin, and cardioselective BB therapy.   3. Type B Aortic Dissection - diagnosed in 2008 by review of prior imaging. Repeat CTA this admission shows evidence of previously noted type B dissection of the aorta beginning just beyond the origin of the left subclavian artery and extending into the upper abdomen and into the proximal left renal artery, causing approximately 90+% stenosis of the proximal left renal artery and interval thrombosis of the false lumen of the dissection. - BP has been variable at 138/89 - 193/100 since admission, at 141/112 on most recent check. Only on Lopressor 25mg  BID PTA as he previously had issues with orthostatic hypotension. Would consider the addition of Losartan or Amlodipine this admission (prefer ACE-I or ARB if EF reduced by repeat  echocardiogram).   4. HLD - followed by PCP. Remains on Atorvastatin 20mg  daily. Goal LDL is < 70 with known CAD.  5. Chronic Hypoxic Respiratory Failure/ COPD - he is on  3-4L [place] at baseline. He has been started on IV steroids and scheduled nebulizer treatments.    For questions or updates, please contact [place] Please consult [email or web address] for contact info under Cardiology/STEMI.  Signed, Erma Heritage, PA-C 01/06/2018, 9:05 AM Pager: [phone]  Attending note Patient seen and discussed with PA Ahmed Prima, I agree with her documentation above. 67 yo male history of CAD with prior CABG, type B aortic dissection managed medically. Clinic notes mention prior demand ischemia w elevated troponins in setting of hypoxemic respiratory failure admission, orthostatic hypotension, COPD. Admitted with chest pain and SOB.   Admit labs  BNP 422 K 4 Cr 0.87 BUN 31 WBC 13.1 Hgb 13.1 Plt 223  ABG 7.4/56/73/32 istat trop 0.17 Trop 0.16-->0.12-->0.19--> EKG SR, inferior and lateral precordial TWIs CXR no acute process  CT PE chronic type B dissection 03/2015 echo LVEF 55-60%, no WMAs, grade I diastolic dysfunction 09/9369 cath LM 50%, LAD occluded, LCX 99%, RCA 50% with distal RCA 95%.  LIMA-LAD patent, SVG-diag patent, RIMA-ramus patent. Occluded SVG-PDA, SVG-OM. Received DES to native RCA  Patient presents with chest pain and SOB in setting of COPD exacerbation. From cardiac standpoint mild trop thus far to 0.19, new TWIs inferior and lateral precordial leads. To me he describes atypical chest pain. Sharp constant pain worst with position.   Continue medical therapy for possible ACS with ASA, atorva, hep gtt, lopressor. May try low dose ACE-I this admit. Follow trop trend and echo, pending further data would consider ischemic evaluation once his respiratory status has improved from his COPD exacerbation, either invasive or noninvasive.   Carlyle Dolly MD

## 2018-01-06 NOTE — Progress Notes (Signed)
*  PRELIMINARY RESULTS* Echocardiogram 2D Echocardiogram has been performed.  Leavy Cella 01/06/2018, 10:32 AM

## 2018-01-06 NOTE — Progress Notes (Signed)
[place] for heparin Indication: chest pain/ACS  Allergies  Allergen Reactions  . Nsaids Shortness Of Breath  . Bee Venom Swelling    Neck swelling, passes out  . Codeine Other (See Comments)    Causes GI Upset    Patient Measurements: Height: 5\' 10"  (177.8 cm) Weight: 147 lb 14.9 oz (67.1 kg) IBW/kg (Calculated) : 73 Heparin Dosing Weight: HEPARIN DW (KG): 67.1   Vital Signs: Temp: 98.2 F (36.8 C) (12/17 0617) Temp Source: Oral (12/17 0617) BP: 141/112 (12/17 0617) Pulse Rate: 79 (12/17 0617)  Labs: Recent Labs    01/04/18 0632 01/05/18 1226 01/05/18 1507 01/05/18 2047 01/06/18 0210 01/06/18 0537  HGB  --  13.1  --   --  12.6*  --   HCT  --  43.0  --   --  40.1  --   PLT  --  223  --   --  230  --   HEPARINUNFRC  --   --   --   --  0.48 0.22*  CREATININE 1.12 0.87  --   --   --   --   TROPONINI  --   --  0.16* 0.12* 0.19*  --     Estimated Creatinine Clearance: 79.3 mL/min (by C-G formula based on SCr of 0.87 mg/dL).  Assessment: Initial heparin level was therapeutic at 0.48 units/ml.  Heparin level this am 0.22 units/ml.  Some bleeding from IV site but otherwise no complications noted Goal of Therapy:  Heparin level 0.3-0.7 units/ml Monitor platelets by anticoagulation protocol: Yes   Plan:  Increase heparin infusion to 1150 units/hr Check anti-Xa level in 6 hours and daily while on heparin Continue to monitor H&H and platelets  Regis Hinton Poteet 01/06/2018,6:40 AM

## 2018-01-06 NOTE — Progress Notes (Signed)
Patients current troponin level is 0.19.Previous troponin level was 0.12. MD made aware. No new orders at this time. Will continue to monitor throughout shift.

## 2018-01-07 LAB — TROPONIN I: Troponin I: 0.08 ng/mL (ref <0.03)

## 2018-01-07 LAB — BASIC METABOLIC PANEL
Anion gap: 10 (ref 5–15)
BUN: 37 mg/dL — ABNORMAL HIGH (ref 8–23)
CO2: 33 mmol/L — ABNORMAL HIGH (ref 22–32)
Calcium: 8.4 mg/dL — ABNORMAL LOW (ref 8.9–10.3)
Chloride: 92 mmol/L — ABNORMAL LOW (ref 98–111)
Creatinine, Ser: 1 mg/dL (ref 0.61–1.24)
GFR calc Af Amer: >60 mL/min (ref >60)
GFR calc non Af Amer: >60 mL/min (ref >60)
Glucose, Bld: 170 mg/dL — ABNORMAL HIGH (ref 70–99)
Potassium: 4.4 mmol/L (ref 3.5–5.1)
Sodium: 135 mmol/L (ref 135–145)

## 2018-01-07 LAB — CBC
HCT: 37.8 % — ABNORMAL LOW (ref 39.0–52.0)
Hemoglobin: 12 g/dL — ABNORMAL LOW (ref 13.0–17.0)
MCH: 31.5 pg (ref 26.0–34.0)
MCHC: 31.7 g/dL (ref 30.0–36.0)
MCV: 99.2 fL (ref 80.0–100.0)
Platelets: 251 10*3/uL (ref 150–400)
RBC: 3.81 MIL/uL — ABNORMAL LOW (ref 4.22–5.81)
RDW: 13.1 % (ref 11.5–15.5)
WBC: 9 10*3/uL (ref 4.0–10.5)
nRBC: 0 % (ref 0.0–0.2)

## 2018-01-07 LAB — HEPARIN LEVEL (UNFRACTIONATED): Heparin Unfractionated: 0.76 IU/mL — ABNORMAL HIGH (ref 0.30–0.70)

## 2018-01-07 MED ORDER — METHYLPREDNISOLONE SODIUM SUCC 125 MG IJ SOLR
60.0000 mg | Freq: Two times a day (BID) | INTRAMUSCULAR | Status: DC
  Administered 2018-01-08 (×2): 60 mg via INTRAVENOUS
  Filled 2018-01-07 (×2): qty 2

## 2018-01-07 MED ORDER — NITROGLYCERIN 2 % TD OINT
0.5000 [in_us] | TOPICAL_OINTMENT | Freq: Four times a day (QID) | TRANSDERMAL | Status: DC
  Administered 2018-01-07 – 2018-01-09 (×7): 0.5 [in_us] via TOPICAL
  Filled 2018-01-07 (×3): qty 1
  Filled 2018-01-07: qty 30
  Filled 2018-01-07 (×2): qty 1

## 2018-01-07 MED ORDER — HYDROCODONE-HOMATROPINE 5-1.5 MG/5ML PO SYRP
5.0000 mL | ORAL_SOLUTION | ORAL | Status: DC | PRN
  Administered 2018-01-07 – 2018-01-08 (×3): 5 mL via ORAL
  Filled 2018-01-07 (×3): qty 5

## 2018-01-07 NOTE — Progress Notes (Addendum)
Progress Note  Patient Name: [name] Date of Encounter: 01/07/2018  Primary Cardiologist: Candee Furbish, MD   Subjective   Breathing improved but still not at baseline. Reports having chest pain and upper abdominal pain intermittently, mostly exacerbated with coughing.   Inpatient Medications    Scheduled Meds: . aspirin  81 mg Oral Daily  . atorvastatin  20 mg Oral q1800  . budesonide (PULMICORT) nebulizer solution  0.5 mg Nebulization BID  . busPIRone  7.5 mg Oral TID  . dextromethorphan-guaiFENesin  1 tablet Oral BID  . feeding supplement (ENSURE ENLIVE)  237 mL Oral BID BM  . gabapentin  300 mg Oral TID  . ipratropium-albuterol  3 mL Nebulization Q6H  . levothyroxine  50 mcg Oral Q0600  . losartan  50 mg Oral Daily  . methylPREDNISolone (SOLU-MEDROL) injection  60 mg Intravenous Q6H  . metoprolol tartrate  25 mg Oral BID  . nicotine  21 mg Transdermal Q24H  . pantoprazole  40 mg Oral QHS   Continuous Infusions: . heparin 1,150 Units/hr (01/06/18 1548)   PRN Meds: acetaminophen **OR** acetaminophen, ALPRAZolam, hydrALAZINE, ipratropium-albuterol, ondansetron **OR** ondansetron (ZOFRAN) IV, oxyCODONE-acetaminophen, polyethylene glycol, traZODone   Vital Signs    Vitals:   01/06/18 2130 01/06/18 2135 01/07/18 0124 01/07/18 0618  BP: 113/77   139/87  Pulse: 93   74  Resp: 17   17  Temp:  (!) 97.3 F (36.3 C)  98.2 F (36.8 C)  TempSrc:  Oral  Oral  SpO2: 98%  98% 95%  Weight:      Height:        Intake/Output Summary (Last 24 hours) at 01/07/2018 0819 Last data filed at 01/07/2018 7425 Gross per 24 hour  Intake 360 ml  Output 1350 ml  Net -990 ml   Filed Weights   01/05/18 1101 01/05/18 1950 01/06/18 0617  Weight: 75 kg 67.1 kg 67.1 kg    Telemetry    NSR, HR in 70's to 80's with occasional PAC's and PVC's. - Personally Reviewed  ECG    NSR, HR 92, with PAC's. LVH and TWI along inferolateral leads.  - Personally Reviewed  Physical Exam    General: Well developed, well nourished Caucasian male appearing in no acute distress. Head: Normocephalic, atraumatic.  Neck: Supple without bruits, JVD not elevated. Lungs:  Resp regular and unlabored, expiratory wheeze along upper lung fields bilaterally. Heart: RRR, S1, S2, no S3, S4, or murmur; no rub. Abdomen: Soft, non-tender, non-distended with normoactive bowel sounds. No hepatomegaly. No rebound/guarding. No obvious abdominal masses. Extremities: No clubbing, cyanosis, or lower extremity edema. Distal pedal pulses are 2+ bilaterally. Neuro: Alert and oriented X 3. Moves all extremities spontaneously. Psych: Normal affect.  Labs    Chemistry Recent Labs  Lab 12/31/17 1208  01/04/18 0632 01/05/18 1226 01/07/18 0514  NA 132*   < > 136 140 135  K 3.5   < > 4.5 4.0 4.4  CL 97*   < > 96* 98 92*  CO2 24   < > 33* 33* 33*  GLUCOSE 119*   < > 154* 146* 170*  BUN 11   < > 43* 31* 37*  CREATININE 0.80   < > 1.12 0.87 1.00  CALCIUM 7.9*   < > 8.3* 8.7* 8.4*  PROT 7.1  --   --   --   --   ALBUMIN 3.3*  --   --   --   --   AST 23  --   --   --   --  ALT 11  --   --   --   --   ALKPHOS 52  --   --   --   --   BILITOT 0.6  --   --   --   --   GFRNONAA >60   < > >60 >60 >60  GFRAA >60   < > >60 >60 >60  ANIONGAP 11   < > 7 9 10    < > = values in this interval not displayed.     Hematology Recent Labs  Lab 01/05/18 1226 01/06/18 0210 01/07/18 0514  WBC 13.1* 11.4* 9.0  RBC 4.23 4.04* 3.81*  HGB 13.1 12.6* 12.0*  HCT 43.0 40.1 37.8*  MCV 101.7* 99.3 99.2  MCH 31.0 31.2 31.5  MCHC 30.5 31.4 31.7  RDW 13.2 13.1 13.1  PLT 223 230 251    Cardiac Enzymes Recent Labs  Lab 01/01/18 0827 01/05/18 1507 01/05/18 2047 01/06/18 0210  TROPONINI 0.06* 0.16* 0.12* 0.19*    Recent Labs  Lab 01/05/18 1231  TROPIPOC 0.17*     BNP Recent Labs  Lab 01/05/18 1226  BNP 422.0*     DDimer No results for input(s): DDIMER in the last 168 hours.   Radiology    Ct  Angio Chest Pe W And/or Wo Contrast  Result Date: 01/05/2018 CLINICAL DATA:  Increased shortness of breath since discharge from the hospital on 01/04/2018. EXAM: CT ANGIOGRAPHY CHEST WITH CONTRAST TECHNIQUE: Multidetector CT imaging of the chest was performed using the standard protocol during bolus administration of intravenous contrast. Multiplanar CT image reconstructions and MIPs were obtained to evaluate the vascular anatomy. CONTRAST:  185mL ISOVUE-370 IOPAMIDOL (ISOVUE-370) INJECTION 76% COMPARISON:  Formal chest obtained earlier today and chest CTA dated 04/03/2015. 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 There has been interval thrombosis of the false lumen of the dissection with the exception of a small amount of persistent opacified blood in the aortic arch superiorly on the left. No significant narrowing of the true lumen of the aorta is seen. Stable post CABG changes. Atheromatous calcifications, including the coronary arteries and aorta. Mediastinum/Nodes: No enlarged mediastinal, hilar, or axillary lymph nodes. Thyroid gland, trachea, and esophagus demonstrate no significant findings. Lungs/Pleura: The lungs remain mildly hyperexpanded with mild bullous changes. Minimal residual atelectasis or scarring at the right lung base posteriorly. No pleural fluid. Upper Abdomen: Aortic dissection extending into the proximal left renal artery, as described above. Musculoskeletal: Motion artifacts making it difficult to assess for rib and sternal fractures. Thoracic spine degenerative changes and mild scoliosis. Lower cervical spine degenerative changes. Review of the MIP images confirms the  above findings. IMPRESSION: 1. 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 2. Interval thrombosis of the false lumen of the dissection with the exception of a small amount of persistent opacified blood in the aortic arch superiorly on the left. 3. Mild changes of COPD. 4. Calcific coronary artery and aortic atherosclerosis and post CABG changes. Aortic Atherosclerosis (ICD10-I70.0) and Emphysema (ICD10-J43.9). Electronically Signed  By: Claudie Revering M.D.   On: 01/05/2018 15:40   Dg Chest Port 1 View  Result Date: 01/05/2018 CLINICAL DATA:  PT was just d/c yesterday afternoon for acute on chronic copd. became short of breath shortly after leaving yesterday. Hx pneumonia/htn/ex smoker/hx heart cath EXAM: PORTABLE CHEST 1 VIEW COMPARISON:  CT chest 04/03/2015 FINDINGS: The lungs are hyperinflated likely secondary to COPD. There is mild bilateral interstitial thickening likely chronic. There is no pleural effusion or pneumothorax. The heart and mediastinal contours are unremarkable. There is dilatation of the aortic arch. The osseous structures are unremarkable. IMPRESSION: No active disease. Dilatation of the aortic arch. Electronically Signed   By: Kathreen Devoid   On: 01/05/2018 12:20    Cardiac Studies   Cardiac Catheterization: 03/16/2015  SVG .  100% occluded  Origin lesion, 100% stenosed.  Ost RCA to Prox RCA lesion, 50% stenosed.  SVG .  This is a sequential graft with an SVG-diagonal and free RIMA that arises from the body of the SVG and goes to the ramus intermedius. The graft is patent throughout  Mid RCA lesion, 30% stenosed.  LIMA .  Patent graft  SVG .  Chronically occluded  Prox Graft lesion, 100% stenosed.  LM lesion, 50% stenosed.  Prox  LAD lesion, 100% stenosed.  Prox Cx lesion, 99% stenosed.  Dist RCA lesion, 95% stenosed. Post intervention, there is a 0% residual stenosis.   1. Severe 3 vessel CAD 2. S/p CABG with continued patency of the LIMA-LAD, SVG-diagonal, and free RIMA-ramus 3. Chronic occlusion of the SVG-PDA and SVG-OM 4. Successful PCI of critical stenosis in the native RCA using a DES platform  ASA/Brilinta x 12 months as tolerated. Should be ok for hospital discharge tomorrow as long as no complications arise.   Echocardiogram: 01/06/2018 Study Conclusions  - Left ventricle: The cavity size was normal. Wall thickness was   increased in a pattern of moderate LVH. Systolic function was   normal. The estimated ejection fraction was in the range of 55%   to 60%. - Aortic valve: Mildly calcified annulus. Mildly thickened   leaflets. Valve area (VTI): 2.26 cm^2. Valve area (Vmax): 2.26   cm^2. - Mitral valve: Mildly calcified annulus. Mildly thickened leaflets   . - Technically difficult study.  Patient Profile     67 y.o. male w/ PMH of CAD (s/p CABG in 2009 with cath in 02/2008 showing occluded SVG-PDA and SVG-OM, repeat cath in 02/2015 showing patency of LIMA-LAD, SVG-D1, and free RIMA-Ramus and PCI/DES of native RCA), Type B aortic dissection, chronic hypoxic respiratory failure (on 3-4L [place] at baseline), HLD, and orthostatic hypotension who presented to [facility] ED on 01/05/2018 for worsening dyspnea. Cardiology consulted for evaluation of chest pain and elevated troponin values.   Assessment & Plan    1. Chest Pain/ Elevated Troponin Values - presented with worsening dyspnea after having recently been discharged for a COPD exacerbation. Cyclic troponin values have been flat at 0.16, 0.12, and 0.19 this admission but EKG tracings do show new TWI along the inferolateral leads. Will re-check troponin value this morning to make sure his trend remains flat.  - echocardiogram this admission shows a  preserved EF of 55-60% with no regional WMA. He still reports having episodes of chest pain now, mostly exacerbated with coughing but tells me he had been experiencing "angina" for the past several months. Given his persistent wheezing on examination, would not be an ideal candidate for Lexiscan. Although his symptoms have typical  and atypical components, will likely need to consider a catheterization later this admission in the setting of his new EKG changes and elevated enzymes above his typical baseline. Respiratory status needs to improve prior to proceeding with testing as he is still coughing very frequently during this encounter. - continue Heparin, ASA, statin, and BB therapy.   2. CAD - s/p CABG in 2009 with cath in 02/2008 showing occluded SVG-PDA and SVG-OM, repeat cath in 02/2015 showing patency of LIMA-LAD, SVG-D1, and free RIMA-Ramus with PCI/DES of native RCA as outlined above. - repeat echo this admission shows a preserved EF of 55-60% with no regional WMA.  - continue ASA, statin, and cardioselective BB therapy. Also on Heparin at this time given elevated troponin values.   3. Type B Aortic Dissection - diagnosed in 2008 by review of prior imaging. Repeat CTA this admission shows evidence of previously noted type B dissection of the aorta beginning just beyond the origin of the left subclavian artery and extending into the upper abdomen and into the proximal left renal artery, causing approximately 90+% stenosis of the proximal left renal artery and interval thrombosis of the false lumen of the dissection. - BP initially elevated upon admission but has improved to 113/77 and 139/87 on most recent checks. He has been continued on PTA Lopressor 25mg  BID with Losartan being initiated by the admitting team as he has required intermittent doses of Hydralazine.   4. HLD - followed by PCP as an outpatient. Continue PTA Atorvastatin 20mg  daily.   5. Chronic Hypoxic Respiratory Failure/  COPD - he is on  3-4L [place] at baseline. Being treated for a COPD exacerbation per the admitting team with IV steroids and bronchodilators.    For questions or updates, please contact [place] Please consult [email or web address] for contact info under Cardiology/STEMI.   Arna Medici , PA-C 8:19 AM 01/07/2018 Pager: [phone]  Attending note Patient seen and discussed with PA Ahmed Prima, I agree with her documentation above. Admitted with SOB and chest pain in setting of COPD exacerbation.  Trop up to 0.19, awaiting to establish peak. new TWIs inferior and lateral precordial leads.He has history of CAD with prior CABG, last cath 2017 where he received a stent to his native RCA. At that time LIMA-LAD patent, SVG-diag patent, RIMA-raums patent, occluded SVG-PDA and occluded SVG-OM. Echo this admit LVEF 55-60%.   Unclear if demand ischemia in setting of COPD exacerbation and chronic occlusive CAD or a component of acute obstructive CAD. Continue to follow trop trend, respiraotry status would need to be improved for either invasive or noninvasive ischemic testing. Continue medical therapy with hep gtt, ASA, statin, ARB, beta blocker. I would anticipate likely a cath later this week once respiratory status improves.    Zandra Abts MD

## 2018-01-07 NOTE — Progress Notes (Addendum)
[place] for heparin Indication: chest pain/ACS  Allergies  Allergen Reactions  . Nsaids Shortness Of Breath  . Bee Venom Swelling    Neck swelling, passes out  . Codeine Other (See Comments)    Causes GI Upset    Patient Measurements: Height: 5\' 10"  (177.8 cm) Weight: 147 lb 14.9 oz (67.1 kg) IBW/kg (Calculated) : 73 Heparin Dosing Weight: HEPARIN DW (KG): 67.1   Vital Signs: Temp: 98.2 F (36.8 C) (12/18 0618) Temp Source: Oral (12/18 0618) BP: 139/87 (12/18 0856) Pulse Rate: 74 (12/18 0856)  Labs: Recent Labs    01/05/18 1226  01/05/18 2047  01/06/18 0210 01/06/18 0537 01/06/18 1308 01/07/18 0514 01/07/18 0855  HGB 13.1  --   --   --  12.6*  --   --  12.0*  --   HCT 43.0  --   --   --  40.1  --   --  37.8*  --   PLT 223  --   --   --  230  --   --  251  --   HEPARINUNFRC  --   --   --    < > 0.48 0.22* 0.55  --  0.76*  CREATININE 0.87  --   --   --   --   --   --  1.00  --   TROPONINI  --    < > 0.12*  --  0.19*  --   --  0.08*  --    < > = values in this interval not displayed.    Estimated Creatinine Clearance: 69 mL/min (by C-G formula based on SCr of 1 mg/dL).  Assessment: Initial heparin level was therapeutic at 0.48 units/ml then 0.22 units/ml.  Some bleeding from IV site but otherwise no complications noted. Heparin level is supratherapeutic now.    Goal of Therapy:  Heparin level 0.3-0.7 units/ml Monitor platelets by anticoagulation protocol: Yes   Plan:  Decrease heparin infusion to 1050 units/hr Check anti-Xa level daily while on heparin Continue to monitor H&H and platelets  Margot Ables, PharmD Clinical Pharmacist 01/07/2018 9:47 AM

## 2018-01-07 NOTE — Progress Notes (Signed)
PROGRESS NOTE    [name]  WEX:[id number] DOB: [birth year] DOA: 01/05/2018 PCP: Shelbie Ammons, MD    Brief Narrative:  67 year old male with a history of COPD, chronic aortic dissection, coronary artery disease, hypertension, presents to the hospital with complaints of shortness of breath.  He was noted to be extremely anxious, had shortness of breath and wheezing.  He was recently discharged from the hospital after being treated for COPD exacerbation.  He presented with tachycardia with a heart rate in the 120s, mild elevation of troponin and new T wave inversions on EKG.  He was admitted for further treatments.  Started on medical management for elevated troponin with cardiology following.  He is also being treated for COPD exacerbation.  Anticipate further cardiac work-up once respiratory status has stabilized.  Anxiety is also playing a role in his symptoms.   Assessment & Plan:   Active Problems:   HTN (hypertension)   CAD (coronary artery disease)   Hypercholesterolemia   Elevated troponin   Descending thoracic dissection (HCC)   COPD exacerbation (HCC)   Acute on chronic respiratory failure with hypoxia (HCC)   Anxiety   Ischemic chest pain ([place])   1. COPD exacerbation.  Overall wheezing is improving.  We will start to decrease steroid dosing.  Continue bronchodilators.  He continues to have significant cough which is likely leading to soreness in his abdomen and back.  We will add Hycodan as needed for cough. 2. Acute on chronic respiratory failure.  Chronically on 3 to 4 L of oxygen.  Try and wean back down to home oxygen requirement.   3. Chest pain with EKG changes.  Patient has known coronary artery disease.  Noted to have new T wave versions in lateral leads.  Has mild elevation of troponin.  Cardiology following.  Currently on heparin infusion.  Echocardiogram has been done without any wall motion abnormalities.  Plan is to consider further ischemic testing once his  respiratory status has stabilized. 4. Hypertension.  Continue on metoprolol and losartan 5. Anxiety.  Will continue on benzodiazepines as needed. 6. Type aortic dissection.  Diagnosed in 2008.  Blood pressures are stable.  Started on losartan. 7. Hyperlipidemia.  Continue on statin   DVT prophylaxis: Heparin infusion Code Status: Full code Family Communication: No family present Disposition Plan: Discharge home once improved   Consultants:   Cardiology  Procedures:  Echo: - Left ventricle: The cavity size was normal. Wall thickness was   increased in a pattern of moderate LVH. Systolic function was   normal. The estimated ejection fraction was in the range of 55%   to 60%. - Aortic valve: Mildly calcified annulus. Mildly thickened   leaflets. Valve area (VTI): 2.26 cm^2. Valve area (Vmax): 2.26   cm^2. - Mitral valve: Mildly calcified annulus. Mildly thickened leaflets   .  - Technically difficult study.  Antimicrobials:      Subjective: Complains of continued anxiety, coughing and shortness of breath.  Complains of back pain and abdominal pain which is worsened with cough.  Objective: Vitals:   01/07/18 0839 01/07/18 0856 01/07/18 1359 01/07/18 1432  BP:  139/87 110/67   Pulse:  74 74   Resp:   20   Temp:   99.1 F (37.3 C)   TempSrc:      SpO2: 98%  99% 98%  Weight:      Height:        Intake/Output Summary (Last 24 hours) at 01/07/2018 1445 Last data filed  at 01/07/2018 8546 Gross per 24 hour  Intake 240 ml  Output 450 ml  Net -210 ml   Filed Weights   01/05/18 1101 01/05/18 1950 01/06/18 0617  Weight: 75 kg 67.1 kg 67.1 kg    Examination:  General exam: Alert, awake, oriented x 3 Respiratory system: Mild wheeze, better than yesterday. Respiratory effort normal. Cardiovascular system:RRR. No murmurs, rubs, gallops. Gastrointestinal system: Abdomen is nondistended, soft and nontender. No organomegaly or masses felt. Normal bowel sounds  heard. Central nervous system: Alert and oriented. No focal neurological deficits. Extremities: No C/C/E, +pedal pulses Skin: No rashes, lesions or ulcers Psychiatry: Overall anxiety but appears little better today.  Data Reviewed: I have personally reviewed following labs and imaging studies  CBC: Recent Labs  Lab 01/01/18 0200 01/05/18 1226 01/06/18 0210 01/07/18 0514  WBC 9.7 13.1* 11.4* 9.0  NEUTROABS  --  10.8*  --   --   HGB 11.4* 13.1 12.6* 12.0*  HCT 36.2* 43.0 40.1 37.8*  MCV 98.4 101.7* 99.3 99.2  PLT 127* 223 230 270   Basic Metabolic Panel: Recent Labs  Lab 01/01/18 0200 01/04/18 0632 01/05/18 1226 01/07/18 0514  NA 133* 136 140 135  K 3.7 4.5 4.0 4.4  CL 100 96* 98 92*  CO2 25 33* 33* 33*  GLUCOSE 201* 154* 146* 170*  BUN 13 43* 31* 37*  CREATININE 0.83 1.12 0.87 1.00  CALCIUM 7.7* 8.3* 8.7* 8.4*   GFR: Estimated Creatinine Clearance: 69 mL/min (by C-G formula based on SCr of 1 mg/dL). Liver Function Tests: No results for input(s): AST, ALT, ALKPHOS, BILITOT, PROT, ALBUMIN in the last 168 hours. No results for input(s): LIPASE, AMYLASE in the last 168 hours. No results for input(s): AMMONIA in the last 168 hours. Coagulation Profile: No results for input(s): INR, PROTIME in the last 168 hours. Cardiac Enzymes: Recent Labs  Lab 01/01/18 0827 01/05/18 1507 01/05/18 2047 01/06/18 0210 01/07/18 0514  TROPONINI 0.06* 0.16* 0.12* 0.19* 0.08*   BNP (last 3 results) No results for input(s): PROBNP in the last 8760 hours. HbA1C: No results for input(s): HGBA1C in the last 72 hours. CBG: No results for input(s): GLUCAP in the last 168 hours. Lipid Profile: No results for input(s): CHOL, HDL, LDLCALC, TRIG, CHOLHDL, LDLDIRECT in the last 72 hours. Thyroid Function Tests: No results for input(s): TSH, T4TOTAL, FREET4, T3FREE, THYROIDAB in the last 72 hours. Anemia Panel: No results for input(s): VITAMINB12, FOLATE, FERRITIN, TIBC, IRON, RETICCTPCT  in the last 72 hours. Sepsis Labs: No results for input(s): PROCALCITON, LATICACIDVEN in the last 168 hours.  Recent Results (from the past 240 hour(s))  Culture, blood (Routine x 2)     Status: None   Collection Time: 12/31/17 12:08 PM  Result Value Ref Range Status   Specimen Description BLOOD LEFT FOREARM  Final   Special Requests   Final    BOTTLES DRAWN AEROBIC AND ANAEROBIC Blood Culture results may not be optimal due to an excessive volume of blood received in culture bottles   Culture   Final    NO GROWTH 5 DAYS Performed at [facility], [address], [place], [place] [address]    Report Status 01/05/2018 FINAL  Final  Culture, blood (Routine x 2)     Status: None   Collection Time: 12/31/17 12:14 PM  Result Value Ref Range Status   Specimen Description BLOOD LEFT HAND  Final   Special Requests   Final    BOTTLES DRAWN AEROBIC AND ANAEROBIC Blood  Culture results may not be optimal due to an inadequate volume of blood received in culture bottles   Culture   Final    NO GROWTH 5 DAYS Performed at [facility], [address], [place], [place] [address]    Report Status 01/05/2018 FINAL  Final  MRSA PCR Screening     Status: None   Collection Time: 01/05/18  7:53 PM  Result Value Ref Range Status   MRSA by PCR NEGATIVE NEGATIVE Final    Comment:        The GeneXpert MRSA Assay (FDA approved for NASAL specimens only), is one component of a comprehensive MRSA colonization surveillance program. It is not intended to diagnose MRSA infection nor to guide or monitor treatment for MRSA infections. Performed at [facility], [address] [place], [place] [address]          Radiology Studies: Ct Angio Chest Pe W And/or Wo Contrast  Result Date: 01/05/2018 CLINICAL DATA:  Increased shortness of breath since discharge from the hospital on 01/04/2018. EXAM: CT ANGIOGRAPHY CHEST WITH CONTRAST TECHNIQUE: Multidetector CT imaging of the chest was performed using the  standard protocol during bolus administration of intravenous contrast. Multiplanar CT image reconstructions and MIPs were obtained to evaluate the vascular anatomy. CONTRAST:  129mL ISOVUE-370 IOPAMIDOL (ISOVUE-370) INJECTION 76% COMPARISON:  Formal chest obtained earlier today and chest CTA dated 04/03/2015. 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 There has been interval thrombosis of the false lumen of the dissection with the exception of a small amount of persistent opacified blood in the aortic arch superiorly on the left. No significant narrowing of the true lumen of the aorta is seen. Stable post CABG changes. Atheromatous calcifications, including the coronary arteries and aorta. Mediastinum/Nodes: No enlarged mediastinal, hilar, or axillary lymph nodes. Thyroid gland, trachea, and esophagus demonstrate no significant findings. Lungs/Pleura: The lungs remain mildly hyperexpanded with mild bullous changes. Minimal residual atelectasis or scarring at the right lung base posteriorly. No pleural fluid. Upper Abdomen: Aortic dissection extending into the proximal left renal artery, as described above. Musculoskeletal: Motion artifacts making it difficult to assess for rib and sternal fractures. Thoracic spine degenerative changes and mild scoliosis. Lower cervical spine degenerative changes. Review of the MIP images confirms the above findings. IMPRESSION: 1. Previously demonstrated Stanford type B dissection of the aorta beginning just beyond the origin of the left subclavian artery and extending into the upper abdomen and into the proximal left renal artery, causing approximately 90+% stenosis of  the proximal left renal artery. 2. Interval thrombosis of the false lumen of the dissection with the exception of a small amount of persistent opacified blood in the aortic arch superiorly on the left. 3. Mild changes of COPD. 4. Calcific coronary artery and aortic atherosclerosis and post CABG changes. Aortic Atherosclerosis (ICD10-I70.0) and Emphysema (ICD10-J43.9). Electronically Signed   By: Claudie Revering M.D.   On: 01/05/2018 15:40        Scheduled Meds: . aspirin  81 mg Oral Daily  . atorvastatin  20 mg Oral q1800  . budesonide (PULMICORT) nebulizer solution  0.5 mg Nebulization BID  . busPIRone  7.5 mg Oral TID  . dextromethorphan-guaiFENesin  1 tablet Oral BID  .  feeding supplement (ENSURE ENLIVE)  237 mL Oral BID BM  . gabapentin  300 mg Oral TID  . ipratropium-albuterol  3 mL Nebulization Q6H  . levothyroxine  50 mcg Oral Q0600  . losartan  50 mg Oral Daily  . methylPREDNISolone (SOLU-MEDROL) injection  60 mg Intravenous Q6H  . metoprolol tartrate  25 mg Oral BID  . nicotine  21 mg Transdermal Q24H  . nitroGLYCERIN  0.5 inch Topical Q6H  . pantoprazole  40 mg Oral QHS   Continuous Infusions: . heparin 1,050 Units/hr (01/07/18 1349)     LOS: 1 day    Time spent: 81mins    Kathie Dike, MD [facility] Pager [phone]  If 7PM-7AM, please contact night-coverage [email or web address] Password [email or web address] 01/07/2018, 2:45 PM

## 2018-01-08 ENCOUNTER — Inpatient Hospital Stay (HOSPITAL_COMMUNITY): Payer: Medicare PPO

## 2018-01-08 LAB — BLOOD GAS, ARTERIAL
Acid-Base Excess: 10.8 mmol/L — ABNORMAL HIGH (ref 0.0–2.0)
Bicarbonate: 33.9 mmol/L — ABNORMAL HIGH (ref 20.0–28.0)
Drawn by: 382351
FIO2: 36
O2 Saturation: 91 %
PATIENT TEMPERATURE: 37
pCO2 arterial: 55.2 mmHg — ABNORMAL HIGH (ref 32.0–48.0)
pH, Arterial: 7.426 (ref 7.350–7.450)
pO2, Arterial: 63 mmHg — ABNORMAL LOW (ref 83.0–108.0)

## 2018-01-08 LAB — CBC
HCT: 29 % — ABNORMAL LOW (ref 39.0–52.0)
Hemoglobin: 9.2 g/dL — ABNORMAL LOW (ref 13.0–17.0)
MCH: 32.1 pg (ref 26.0–34.0)
MCHC: 31.7 g/dL (ref 30.0–36.0)
MCV: 101 fL — ABNORMAL HIGH (ref 80.0–100.0)
Platelets: 204 10*3/uL (ref 150–400)
RBC: 2.87 MIL/uL — ABNORMAL LOW (ref 4.22–5.81)
RDW: 13.1 % (ref 11.5–15.5)
WBC: 11.8 10*3/uL — ABNORMAL HIGH (ref 4.0–10.5)
nRBC: 0 % (ref 0.0–0.2)

## 2018-01-08 LAB — PROTIME-INR
INR: 1.08
Prothrombin Time: 13.9 seconds (ref 11.4–15.2)

## 2018-01-08 LAB — HEPARIN LEVEL (UNFRACTIONATED): Heparin Unfractionated: 0.51 IU/mL (ref 0.30–0.70)

## 2018-01-08 LAB — APTT: aPTT: 115 seconds — ABNORMAL HIGH (ref 24–36)

## 2018-01-08 LAB — GLUCOSE, CAPILLARY: Glucose-Capillary: 158 mg/dL — ABNORMAL HIGH (ref 70–99)

## 2018-01-08 LAB — AMMONIA: Ammonia: 27 umol/L (ref 9–35)

## 2018-01-08 MED ORDER — SODIUM CHLORIDE 0.9% FLUSH
3.0000 mL | Freq: Two times a day (BID) | INTRAVENOUS | Status: DC
  Administered 2018-01-09 (×2): 3 mL via INTRAVENOUS
  Administered 2018-01-10: 22:00:00 via INTRAVENOUS
  Administered 2018-01-10 – 2018-01-11 (×2): 3 mL via INTRAVENOUS

## 2018-01-08 MED ORDER — MORPHINE SULFATE (PF) 2 MG/ML IV SOLN
2.0000 mg | INTRAVENOUS | Status: AC | PRN
  Administered 2018-01-08 – 2018-01-09 (×3): 2 mg via INTRAVENOUS
  Filled 2018-01-08 (×3): qty 1

## 2018-01-08 MED ORDER — IPRATROPIUM-ALBUTEROL 0.5-2.5 (3) MG/3ML IN SOLN
3.0000 mL | Freq: Four times a day (QID) | RESPIRATORY_TRACT | Status: DC | PRN
  Administered 2018-01-10 – 2018-01-17 (×3): 3 mL via RESPIRATORY_TRACT
  Filled 2018-01-08 (×3): qty 3

## 2018-01-08 MED ORDER — SODIUM CHLORIDE 0.9 % IV SOLN
INTRAVENOUS | Status: DC
  Administered 2018-01-09 (×2): via INTRAVENOUS
  Administered 2018-01-10: 950 mL via INTRAVENOUS
  Administered 2018-01-11: 20:00:00 via INTRAVENOUS

## 2018-01-08 MED ORDER — HYDROCODONE-HOMATROPINE 5-1.5 MG/5ML PO SYRP: 5.0000 mL | ORAL_SOLUTION | Freq: Three times a day (TID) | ORAL | Status: DC | PRN

## 2018-01-08 MED ORDER — SODIUM CHLORIDE 0.9 % IV SOLN: 250.0000 mL | INTRAVENOUS | Status: DC | PRN

## 2018-01-08 MED ORDER — ASPIRIN 81 MG PO CHEW
81.0000 mg | CHEWABLE_TABLET | ORAL | Status: AC
  Filled 2018-01-08: qty 1

## 2018-01-08 MED ORDER — PREDNISONE 20 MG PO TABS
40.0000 mg | ORAL_TABLET | Freq: Every day | ORAL | Status: DC
  Administered 2018-01-09 – 2018-01-11 (×3): 40 mg via ORAL
  Filled 2018-01-08 (×3): qty 2

## 2018-01-08 MED ORDER — SODIUM CHLORIDE 0.9% FLUSH: 3.0000 mL | INTRAVENOUS | Status: DC | PRN

## 2018-01-08 NOTE — Progress Notes (Signed)
Progress Note  Patient Name: [name] Date of Encounter: 01/08/2018  Primary Cardiologist: Candee Furbish, MD  Subjective   Some improvmeent in SOB  Inpatient Medications    Scheduled Meds: . aspirin  81 mg Oral Daily  . atorvastatin  20 mg Oral q1800  . budesonide (PULMICORT) nebulizer solution  0.5 mg Nebulization BID  . busPIRone  7.5 mg Oral TID  . dextromethorphan-guaiFENesin  1 tablet Oral BID  . feeding supplement (ENSURE ENLIVE)  237 mL Oral BID BM  . gabapentin  300 mg Oral TID  . ipratropium-albuterol  3 mL Nebulization Q6H  . levothyroxine  50 mcg Oral Q0600  . losartan  50 mg Oral Daily  . methylPREDNISolone (SOLU-MEDROL) injection  60 mg Intravenous Q12H  . metoprolol tartrate  25 mg Oral BID  . nicotine  21 mg Transdermal Q24H  . nitroGLYCERIN  0.5 inch Topical Q6H  . pantoprazole  40 mg Oral QHS   Continuous Infusions: . heparin 1,050 Units/hr (01/07/18 1349)   PRN Meds: acetaminophen **OR** acetaminophen, ALPRAZolam, hydrALAZINE, HYDROcodone-homatropine, ipratropium-albuterol, ondansetron **OR** ondansetron (ZOFRAN) IV, oxyCODONE-acetaminophen, polyethylene glycol, traZODone   Vital Signs    Vitals:   01/08/18 0256 01/08/18 0624 01/08/18 0759 01/08/18 0806  BP:  100/64    Pulse:  72    Resp:  17    Temp:  97.7 F (36.5 C)    TempSrc:  Oral    SpO2: 99% 100% 99% 100%  Weight:      Height:        Intake/Output Summary (Last 24 hours) at 01/08/2018 0952 Last data filed at 01/08/2018 0630 Gross per 24 hour  Intake 480 ml  Output 1050 ml  Net -570 ml   Filed Weights   01/05/18 1101 01/05/18 1950 01/06/18 0617  Weight: 75 kg 67.1 kg 67.1 kg    Telemetry    SR - Personally Reviewed  ECG    na  Physical Exam   GEN: No acute distress.   Neck: No JVD Cardiac: RRR, no murmurs, rubs, or gallops.  Respiratory: mild wheezing bilaterally GI: Soft, nontender, non-distended  MS: No edema; No deformity. Neuro:  Nonfocal  Psych: Normal  affect   Labs    Chemistry Recent Labs  Lab 01/04/18 0632 01/05/18 1226 01/07/18 0514  NA 136 140 135  K 4.5 4.0 4.4  CL 96* 98 92*  CO2 33* 33* 33*  GLUCOSE 154* 146* 170*  BUN 43* 31* 37*  CREATININE 1.12 0.87 1.00  CALCIUM 8.3* 8.7* 8.4*  GFRNONAA >60 >60 >60  GFRAA >60 >60 >60  ANIONGAP 7 9 10      Hematology Recent Labs  Lab 01/06/18 0210 01/07/18 0514 01/08/18 0437  WBC 11.4* 9.0 11.8*  RBC 4.04* 3.81* 2.87*  HGB 12.6* 12.0* 9.2*  HCT 40.1 37.8* 29.0*  MCV 99.3 99.2 101.0*  MCH 31.2 31.5 32.1  MCHC 31.4 31.7 31.7  RDW 13.1 13.1 13.1  PLT 230 251 204    Cardiac Enzymes Recent Labs  Lab 01/05/18 1507 01/05/18 2047 01/06/18 0210 01/07/18 0514  TROPONINI 0.16* 0.12* 0.19* 0.08*    Recent Labs  Lab 01/05/18 1231  TROPIPOC 0.17*     BNP Recent Labs  Lab 01/05/18 1226  BNP 422.0*     DDimer No results for input(s): DDIMER in the last 168 hours.   Radiology    Ct Head Code Stroke Wo Contrast  Result Date: 01/08/2018 CLINICAL DATA:  Code stroke. EXAM: CT HEAD WITHOUT CONTRAST TECHNIQUE:  Contiguous axial images were obtained from the base of the skull through the vertex without intravenous contrast. COMPARISON:  Head CT 05/11/2015 FINDINGS: Brain: There is no mass, hemorrhage or extra-axial collection. There is generalized atrophy without lobar predilection. Areas of hypoattenuation of the deep gray nuclei and confluent periventricular white matter hypodensity, consistent with chronic small vessel disease. Vascular: Atherosclerotic calcification of the vertebral and internal carotid arteries at the skull base. No abnormal hyperdensity of the major intracranial arteries or dural venous sinuses. Skull: The visualized skull base, calvarium and extracranial soft tissues are normal. Sinuses/Orbits: No fluid levels or advanced mucosal thickening of the visualized paranasal sinuses. No mastoid or middle ear effusion. The orbits are normal. ASPECTS [facility]  Stroke Program Early CT Score) - Ganglionic level infarction (caudate, lentiform nuclei, internal capsule, insula, M1-M3 cortex): 7 - Supraganglionic infarction (M4-M6 cortex): 3 Total score (0-10 with 10 being normal): 10 IMPRESSION: 1. CLINICAL DATA:  Code stroke. EXAM: CT HEAD WITHOUT CONTRAST TECHNIQUE: Contiguous axial images were obtained from the base of the skull through the vertex without intravenous contrast. COMPARISON:  Head CT 05/11/2015 FINDINGS: Brain: There is no mass, hemorrhage or extra-axial collection. There is generalized atrophy without lobar predilection. Areas of hypoattenuation of the deep gray nuclei and confluent periventricular white matter hypodensity, consistent with chronic small vessel disease. Vascular: Atherosclerotic calcification of the vertebral and internal carotid arteries at the skull base. No abnormal hyperdensity of the major intracranial arteries or dural venous sinuses. Skull: The visualized skull base, calvarium and extracranial soft tissues are normal. Sinuses/Orbits: No fluid levels or advanced mucosal thickening of the visualized paranasal sinuses. No mastoid or middle ear effusion. The orbits are normal. ASPECTS [facility] Stroke Program Early CT Score) - Ganglionic level infarction (caudate, lentiform nuclei, internal capsule, insula, M1-M3 cortex): 7 - Supraganglionic infarction (M4-M6 cortex): 3 Total score (0-10 with 10 being normal): 10 IMPRESSION: 1. No acute hemorrhage or mass effect. 2. ASPECTS is 10. 3. Generalized atrophy and findings of chronic ischemic microangiopathy, including multiple old lacunar infarcts. These results were called by telephone at the time of interpretation on 01/08/2018 at 12:28 am to Dr. Derrill Kay , who verbally acknowledged these results. Electronically Signed   By: Ulyses Jarred M.D.   On: 01/08/2018 00:30    Cardiac Studies    Patient Profile     67 y.o. male w/ PMH of CAD (s/p CABG in 2009 with cath in 02/2008 showing  occluded SVG-PDA and SVG-OM, repeat cath in 02/2015 showing patency of LIMA-LAD, SVG-D1, and free RIMA-Ramus and PCI/DES of native RCA), Type B aortic dissection, chronic hypoxic respiratory failure (on 3-4L [place] at baseline), HLD, and orthostatic hypotensionwho presented [facility] for worsening dyspnea. Cardiology consulted for evaluation of chest pain and elevated troponin values.   Assessment & Plan    1. Chest pain/ Elevated troponins - He has history of CAD with prior CABG, last cath 2017 where he received a stent to his native RCA. At that time LIMA-LAD patent, SVG-diag patent, RIMA-raums patent, occluded SVG-PDA and occluded SVG-OM.  - presented with worsening dyspnea after having recently been discharged for a COPD exacerbation. Cyclic troponin values have been flat at0.16, 0.12, and 0.19 peak this admission but EKG tracings do show new TWI along the inferolateral leads - Echo this admit LVEF 55-60%. - convoluted chest pain symptoms in setting of COPD exacerbation - Unclear if demand ischemia in setting of COPD exacerbation and chronic occlusive CAD or a component of acute  obstructive CAD. Continue to follow trop trend, respiraotry status would need to be improved for either invasive or noninvasive ischemic testing. Continue medical therapy with hep gtt, ASA, statin, ARB, beta blocker  - plan for cath once respiratory status has improved    For questions or updates, please contact [place] Please consult [email or web address] for contact info under        Signed, Carlyle Dolly, MD  01/08/2018, 9:52 AM

## 2018-01-08 NOTE — Progress Notes (Signed)
   Per Dr. Harl Bowie, patient is to transfer to [facility] today which is being arranged by the Hospitalist service for possible cardiac catheterization tomorrow pending his respiratory status. Tentatively added to cath board for tomorrow and reviewed with the Cath Lab and Trish that he should be rounded on by Cardiology prior to being taken to the Cath Lab to make sure respiratory status is stable for the procedure.   Will make NPO after midnight and enter procedural orders.   Signed, Erma Heritage, PA-C 01/08/2018, 10:58 AM Pager: [phone]

## 2018-01-08 NOTE — Progress Notes (Signed)
[name] for heparin Indication: chest pain/ACS  Allergies  Allergen Reactions  . Nsaids Shortness Of Breath  . Bee Venom Swelling    Neck swelling, passes out  . Codeine Other (See Comments)    Causes GI Upset    Patient Measurements: Height: 5\' 10"  (177.8 cm) Weight: 147 lb 14.9 oz (67.1 kg) IBW/kg (Calculated) : 73 Heparin Dosing Weight: HEPARIN DW (KG): 67.1   Vital Signs: Temp: 97.7 F (36.5 C) (12/19 0624) Temp Source: Oral (12/19 0624) BP: 100/64 (12/19 0624) Pulse Rate: 72 (12/19 0624)  Labs: Recent Labs    01/05/18 1226  01/05/18 2047 01/06/18 0210  01/06/18 1308 01/07/18 0514 01/07/18 0855 01/08/18 0004 01/08/18 0437  HGB 13.1  --   --  12.6*  --   --  12.0*  --   --  9.2*  HCT 43.0  --   --  40.1  --   --  37.8*  --   --  29.0*  PLT 223  --   --  230  --   --  251  --   --  204  APTT  --   --   --   --   --   --   --   --  115*  --   LABPROT  --   --   --   --   --   --   --   --  13.9  --   INR  --   --   --   --   --   --   --   --  1.08  --   HEPARINUNFRC  --   --   --  0.48   < > 0.55  --  0.76*  --  0.51  CREATININE 0.87  --   --   --   --   --  1.00  --   --   --   TROPONINI  --    < > 0.12* 0.19*  --   --  0.08*  --   --   --    < > = values in this interval not displayed.    Estimated Creatinine Clearance: 69 mL/min (by C-G formula based on SCr of 1 mg/dL).  Assessment: Initial heparin level was therapeutic at 0.48 units/ml then 0.22 units/ml.  Some bleeding from IV site but otherwise no complications noted. Heparin level is therapeutic now.    Goal of Therapy:  Heparin level 0.3-0.7 units/ml Monitor platelets by anticoagulation protocol: Yes   Plan:  Continue heparin infusion at 1050 units/hr Check anti-Xa level daily while on heparin Continue to monitor H&H and platelets  Margot Ables, PharmD Clinical Pharmacist 01/08/2018 7:57 AM

## 2018-01-08 NOTE — Care Plan (Signed)
Code stroke called on patient for aphasia and right hemiparesis.  Patient admitted with elevated troponin is currently on heparin drip.  Patient is received several sedatives tonight including Xanax and Percocet.  Patient is had quick resolution of his symptoms by my arrival to the bedside less than 10 minutes after code stroke called.  Stat CT of the head is done shows no hemorrhage or acute infarct.  Patient is not a candidate for TPA secondary to heparin usage.  He is also had quick resolution of his symptoms at this time.  Patient appears more overly sedated.  We will hold all sedatives at this time.  Patient's lungs are coarse with diffuse wheezing we will also check a stat ABG to make sure he is not retaining PCO2 to call some confusion.  He also appears slightly jaundiced we will check an ammonia level.  Will assess other causes for confusion and somnolence.  Cranial nerves II through XII are grossly intact there is no focal neurologic deficits.  Initially had some slurred speech but once I advised I would hold all of his sedatives he was able to speak in very clear sentences.

## 2018-01-08 NOTE — Progress Notes (Signed)
MD gave verbal order to hold all sedatives

## 2018-01-08 NOTE — Progress Notes (Addendum)
Paged Dr. Roderic Palau at this time via [place] system. Patient request more pain medication for his back, hips and stomach. Per, Dr. Roderic Palau, no new orders at this time

## 2018-01-08 NOTE — Progress Notes (Signed)
2355 Patient having hard time forming words. Pt unable to grip with R hand, raise R hand or raise R leg, drooping of R side of face noticed. Based on symptoms Code Stroke was initiated and MD was made aware of situation.

## 2018-01-08 NOTE — Progress Notes (Signed)
Beeper   2359 01/07/2018. Overhead page [phone] In CT   1215  01/08/2018 Out   Jo[place]

## 2018-01-08 NOTE — Plan of Care (Signed)
  Problem: Education: Goal: Knowledge of General Education information will improve Description Including pain rating scale, medication(s)/side effects and non-pharmacologic comfort measures Outcome: Progressing   Problem: Health Behavior/Discharge Planning: Goal: Ability to manage health-related needs will improve Outcome: Progressing   Problem: Clinical Measurements: Goal: Ability to maintain clinical measurements within normal limits will improve Outcome: Progressing Goal: Will remain free from infection Outcome: Progressing Goal: Diagnostic test results will improve Outcome: Progressing Goal: Respiratory complications will improve Outcome: Progressing Goal: Cardiovascular complication will be avoided Outcome: Progressing   Problem: Activity: Goal: Risk for activity intolerance will decrease Outcome: Progressing   Problem: Nutrition: Goal: Adequate nutrition will be maintained Outcome: Progressing   Problem: Coping: Goal: Level of anxiety will decrease Outcome: Progressing   Problem: Elimination: Goal: Will not experience complications related to bowel motility Outcome: Progressing Goal: Will not experience complications related to urinary retention Outcome: Progressing   Problem: Pain Managment: Goal: General experience of comfort will improve Outcome: Progressing   Problem: Safety: Goal: Ability to remain free from injury will improve Outcome: Progressing   Problem: Skin Integrity: Goal: Risk for impaired skin integrity will decrease Outcome: Progressing   Problem: Education: Goal: Knowledge of disease or condition will improve Outcome: Progressing Goal: Knowledge of the prescribed therapeutic regimen will improve Outcome: Progressing Goal: Individualized Educational Video(s) Outcome: Progressing   Problem: Activity: Goal: Ability to tolerate increased activity will improve Outcome: Progressing Goal: Will verbalize the importance of balancing  activity with adequate rest periods Outcome: Progressing

## 2018-01-08 NOTE — Progress Notes (Signed)
PROGRESS NOTE    [name]  ERD:[id number] DOB: Oct 28, 1950 DOA: 01/05/2018 PCP: Shelbie Ammons, MD    Brief Narrative:  67 year old male with a history of COPD, chronic aortic dissection, coronary artery disease, hypertension, presents to the hospital with complaints of shortness of breath.  He was noted to be extremely anxious, had shortness of breath and wheezing.  He was recently discharged from the hospital after being treated for COPD exacerbation.  He presented with tachycardia with a heart rate in the 120s, mild elevation of troponin and new T wave inversions on EKG.  He was admitted for further treatments.  For EKG changes and mild elevation of troponin, he was started on medical management with intravenous heparin, aspirin and beta-blockers.  He was seen by cardiology who recommended transfer to [facility] for cardiac cath once respiratory status has stabilized.  For COPD exacerbation, he was treated with intravenous steroids and bronchodilators.  Overall wheezing has resolved.  He has anxiety and chronic pain which also play a role in his symptomatology.   Assessment & Plan:   Active Problems:   HTN (hypertension)   CAD (coronary artery disease)   Hypercholesterolemia   Elevated troponin   Descending thoracic dissection (HCC)   COPD exacerbation (HCC)   Acute on chronic respiratory failure with hypoxia (HCC)   Anxiety   Ischemic chest pain ([place])   1. COPD exacerbation.  Overall wheezing is improving.  IV steroids have been changed to prednisone taper.  Continue bronchodilators.   2. Acute on chronic respiratory failure.  Chronically on 3 to 4 L of oxygen.  Try and wean back down to home oxygen requirement.   3. Chest pain with EKG changes.  Patient has known coronary artery disease.  Noted to have new T wave versions in lateral leads.  Has mild elevation of troponin.  Cardiology following.  Currently on heparin infusion.  Echocardiogram has been done without any  wall motion abnormalities.  Per cardiology, will transfer to [facility] for cardiac cath.  He will be kept n.p.o. after midnight. 4. Hypertension.  Continue on metoprolol and losartan 5. Anxiety.  Will continue on benzodiazepines as needed.  Is likely playing a big part in his symptoms. 6. Chronic pain syndrome.  Patient reports having bilateral avascular necrosis and hips as well as spinal stenosis.  He is requesting frequent doses of pain medications. 7. Type aortic dissection.  Diagnosed in 2008.  Blood pressures are stable.  Started on losartan. 8. Hyperlipidemia.  Continue on statin 9. Acute encephalopathy.  Patient noted to have slurring speech overnight on 12/18.  Initially code stroke was called and CT head was found to be unremarkable.  It was felt that his symptoms were related to excessive pain medications.  His dosages were decreased.  Mental status has returned to baseline.   DVT prophylaxis: Heparin infusion Code Status: Full code Family Communication: No family present Disposition Plan: Transfer to [facility] for evaluation for cardiac cath   Consultants:   Cardiology  Procedures:  Echo: - Left ventricle: The cavity size was normal. Wall thickness was   increased in a pattern of moderate LVH. Systolic function was   normal. The estimated ejection fraction was in the range of 55%   to 60%. - Aortic valve: Mildly calcified annulus. Mildly thickened   leaflets. Valve area (VTI): 2.26 cm^2. Valve area (Vmax): 2.26   cm^2. - Mitral valve: Mildly calcified annulus. Mildly thickened leaflets   .  -  Technically difficult study.  Antimicrobials:      Subjective: Complains of pain in his lower back and his hips.  This is a chronic pain.  Reports being told that he has avascular necrosis of his hips bilaterally as well as lumbar stenosis.  Says that his pain is worse since he has been laying in bed.  Frequently requesting pain medications.  Overnight events  reviewed with patient had slurred speech and initially code stroke was called.  CT head was found to be unremarkable.  It was felt that his symptoms were related to medications and medications were decreased.  Speech has since improved..  Objective: Vitals:   01/08/18 0759 01/08/18 0806 01/08/18 1428 01/08/18 1434  BP:   113/63   Pulse:   (!) 57   Resp:   15   Temp:   98.7 F (37.1 C)   TempSrc:   Oral   SpO2: 99% 100% 93% (!) 86%  Weight:      Height:        Intake/Output Summary (Last 24 hours) at 01/08/2018 1701 Last data filed at 01/08/2018 1230 Gross per 24 hour  Intake 0 ml  Output 550 ml  Net -550 ml   Filed Weights   01/05/18 1101 01/05/18 1950 01/06/18 0617  Weight: 75 kg 67.1 kg 67.1 kg    Examination:  General exam: Alert, awake, oriented x 3 Respiratory system: Clear bilaterally, no wheezing. Respiratory effort normal. Cardiovascular system:RRR. No murmurs, rubs, gallops. Gastrointestinal system: Abdomen is nondistended, soft and nontender. No organomegaly or masses felt. Normal bowel sounds heard. Central nervous system: Alert and oriented. No focal neurological deficits. Extremities: No C/C/E, +pedal pulses Skin: No rashes, lesions or ulcers Psychiatry: Appears calm, no confusion  Data Reviewed: I have personally reviewed following labs and imaging studies  CBC: Recent Labs  Lab 01/05/18 1226 01/06/18 0210 01/07/18 0514 01/08/18 0437  WBC 13.1* 11.4* 9.0 11.8*  NEUTROABS 10.8*  --   --   --   HGB 13.1 12.6* 12.0* 9.2*  HCT 43.0 40.1 37.8* 29.0*  MCV 101.7* 99.3 99.2 101.0*  PLT 223 230 251 578   Basic Metabolic Panel: Recent Labs  Lab 01/04/18 0632 01/05/18 1226 01/07/18 0514  NA 136 140 135  K 4.5 4.0 4.4  CL 96* 98 92*  CO2 33* 33* 33*  GLUCOSE 154* 146* 170*  BUN 43* 31* 37*  CREATININE 1.12 0.87 1.00  CALCIUM 8.3* 8.7* 8.4*   GFR: Estimated Creatinine Clearance: 69 mL/min (by C-G formula based on SCr of 1 mg/dL). Liver Function  Tests: No results for input(s): AST, ALT, ALKPHOS, BILITOT, PROT, ALBUMIN in the last 168 hours. No results for input(s): LIPASE, AMYLASE in the last 168 hours. Recent Labs  Lab 01/08/18 0054  AMMONIA 27   Coagulation Profile: Recent Labs  Lab 01/08/18 0004  INR 1.08   Cardiac Enzymes: Recent Labs  Lab 01/05/18 1507 01/05/18 2047 01/06/18 0210 01/07/18 0514  TROPONINI 0.16* 0.12* 0.19* 0.08*   BNP (last 3 results) No results for input(s): PROBNP in the last 8760 hours. HbA1C: No results for input(s): HGBA1C in the last 72 hours. CBG: Recent Labs  Lab 01/08/18 0000  GLUCAP 158*   Lipid Profile: No results for input(s): CHOL, HDL, LDLCALC, TRIG, CHOLHDL, LDLDIRECT in the last 72 hours. Thyroid Function Tests: No results for input(s): TSH, T4TOTAL, FREET4, T3FREE, THYROIDAB in the last 72 hours. Anemia Panel: No results for input(s): VITAMINB12, FOLATE, FERRITIN, TIBC, IRON, RETICCTPCT in the last 72  hours. Sepsis Labs: No results for input(s): PROCALCITON, LATICACIDVEN in the last 168 hours.  Recent Results (from the past 240 hour(s))  Culture, blood (Routine x 2)     Status: None   Collection Time: 12/31/17 12:08 PM  Result Value Ref Range Status   Specimen Description BLOOD LEFT FOREARM  Final   Special Requests   Final    BOTTLES DRAWN AEROBIC AND ANAEROBIC Blood Culture results may not be optimal due to an excessive volume of blood received in culture bottles   Culture   Final    NO GROWTH 5 DAYS Performed at [facility], [address], [place], [place] [address]    Report Status 01/05/2018 FINAL  Final  Culture, blood (Routine x 2)     Status: None   Collection Time: 12/31/17 12:14 PM  Result Value Ref Range Status   Specimen Description BLOOD LEFT HAND  Final   Special Requests   Final    BOTTLES DRAWN AEROBIC AND ANAEROBIC Blood Culture results may not be optimal due to an inadequate volume of blood received in culture bottles   Culture   Final    NO  GROWTH 5 DAYS Performed at [facility], [address] [place], [place] [address]    Report Status 01/05/2018 FINAL  Final  MRSA PCR Screening     Status: None   Collection Time: 01/05/18  7:53 PM  Result Value Ref Range Status   MRSA by PCR NEGATIVE NEGATIVE Final    Comment:        The GeneXpert MRSA Assay (FDA approved for NASAL specimens only), is one component of a comprehensive MRSA colonization surveillance program. It is not intended to diagnose MRSA infection nor to guide or monitor treatment for MRSA infections. Performed at [facility], [address], [place], [place] [address]          Radiology Studies: Ct Head Code Stroke Wo Contrast  Result Date: 01/08/2018 CLINICAL DATA:  Code stroke. EXAM: CT HEAD WITHOUT CONTRAST TECHNIQUE: Contiguous axial images were obtained from the base of the skull through the vertex without intravenous contrast. COMPARISON:  Head CT 05/11/2015 FINDINGS: Brain: There is no mass, hemorrhage or extra-axial collection. There is generalized atrophy without lobar predilection. Areas of hypoattenuation of the deep gray nuclei and confluent periventricular white matter hypodensity, consistent with chronic small vessel disease. Vascular: Atherosclerotic calcification of the vertebral and internal carotid arteries at the skull base. No abnormal hyperdensity of the major intracranial arteries or dural venous sinuses. Skull: The visualized skull base, calvarium and extracranial soft tissues are normal. Sinuses/Orbits: No fluid levels or advanced mucosal thickening of the visualized paranasal sinuses. No mastoid or middle ear effusion. The orbits are normal. ASPECTS [facility] Stroke Program Early CT Score) - Ganglionic level infarction (caudate, lentiform nuclei, internal capsule, insula, M1-M3 cortex): 7 - Supraganglionic infarction (M4-M6 cortex): 3 Total score (0-10 with 10 being normal): 10 IMPRESSION: 1. CLINICAL DATA:  Code stroke. EXAM: CT HEAD  WITHOUT CONTRAST TECHNIQUE: Contiguous axial images were obtained from the base of the skull through the vertex without intravenous contrast. COMPARISON:  Head CT 05/11/2015 FINDINGS: Brain: There is no mass, hemorrhage or extra-axial collection. There is generalized atrophy without lobar predilection. Areas of hypoattenuation of the deep gray nuclei and confluent periventricular white matter hypodensity, consistent with chronic small vessel disease. Vascular: Atherosclerotic calcification of the vertebral and internal carotid arteries at the skull base. No abnormal hyperdensity of the major intracranial arteries or dural venous sinuses. Skull:  The visualized skull base, calvarium and extracranial soft tissues are normal. Sinuses/Orbits: No fluid levels or advanced mucosal thickening of the visualized paranasal sinuses. No mastoid or middle ear effusion. The orbits are normal. ASPECTS [facility] Stroke Program Early CT Score) - Ganglionic level infarction (caudate, lentiform nuclei, internal capsule, insula, M1-M3 cortex): 7 - Supraganglionic infarction (M4-M6 cortex): 3 Total score (0-10 with 10 being normal): 10 IMPRESSION: 1. No acute hemorrhage or mass effect. 2. ASPECTS is 10. 3. Generalized atrophy and findings of chronic ischemic microangiopathy, including multiple old lacunar infarcts. These results were called by telephone at the time of interpretation on 01/08/2018 at 12:28 am to Dr. Derrill Kay , who verbally acknowledged these results. Electronically Signed   By: Ulyses Jarred M.D.   On: 01/08/2018 00:30        Scheduled Meds: . aspirin  81 mg Oral Daily  . [START ON 01/09/2018] aspirin  81 mg Oral Pre-Cath  . atorvastatin  20 mg Oral q1800  . budesonide (PULMICORT) nebulizer solution  0.5 mg Nebulization BID  . busPIRone  7.5 mg Oral TID  . dextromethorphan-guaiFENesin  1 tablet Oral BID  . feeding supplement (ENSURE ENLIVE)  237 mL Oral BID BM  . gabapentin  300 mg Oral TID  .  ipratropium-albuterol  3 mL Nebulization Q6H  . levothyroxine  50 mcg Oral Q0600  . losartan  50 mg Oral Daily  . methylPREDNISolone (SOLU-MEDROL) injection  60 mg Intravenous Q12H  . metoprolol tartrate  25 mg Oral BID  . nicotine  21 mg Transdermal Q24H  . nitroGLYCERIN  0.5 inch Topical Q6H  . pantoprazole  40 mg Oral QHS  . sodium chloride flush  3 mL Intravenous Q12H   Continuous Infusions: . sodium chloride    . [START ON 01/09/2018] sodium chloride    . heparin 1,050 Units/hr (01/07/18 1349)     LOS: 2 days    Time spent: 60mins    Kathie Dike, MD [facility] Pager [phone]  If 7PM-7AM, please contact night-coverage [email or web address] Password [email or web address] 01/08/2018, 5:01 PM

## 2018-01-08 NOTE — Progress Notes (Signed)
Pt arrived to 4E27 approx 1645 via [facility] from [place] given.  Vital signs obtained.  CCMD notified and Box 4E27 applied.  Skin assessment done.  Oriented to room.

## 2018-01-09 ENCOUNTER — Inpatient Hospital Stay (HOSPITAL_COMMUNITY): Payer: Medicare PPO

## 2018-01-09 DIAGNOSIS — R7989 Other specified abnormal findings of blood chemistry: Secondary | ICD-10-CM

## 2018-01-09 DIAGNOSIS — I2510 Atherosclerotic heart disease of native coronary artery without angina pectoris: Secondary | ICD-10-CM

## 2018-01-09 DIAGNOSIS — I7101 Dissection of thoracic aorta: Secondary | ICD-10-CM

## 2018-01-09 DIAGNOSIS — I2583 Coronary atherosclerosis due to lipid rich plaque: Secondary | ICD-10-CM

## 2018-01-09 DIAGNOSIS — R578 Other shock: Secondary | ICD-10-CM

## 2018-01-09 DIAGNOSIS — D649 Anemia, unspecified: Secondary | ICD-10-CM

## 2018-01-09 LAB — BASIC METABOLIC PANEL
Anion gap: 13 (ref 5–15)
BUN: 53 mg/dL — ABNORMAL HIGH (ref 8–23)
CO2: 31 mmol/L (ref 22–32)
CREATININE: 1.36 mg/dL — AB (ref 0.61–1.24)
Calcium: 8 mg/dL — ABNORMAL LOW (ref 8.9–10.3)
Chloride: 91 mmol/L — ABNORMAL LOW (ref 98–111)
GFR calc Af Amer: >60 mL/min (ref >60)
GFR calc non Af Amer: 54 mL/min — ABNORMAL LOW (ref >60)
Glucose, Bld: 207 mg/dL — ABNORMAL HIGH (ref 70–99)
Potassium: 4.8 mmol/L (ref 3.5–5.1)
Sodium: 135 mmol/L (ref 135–145)

## 2018-01-09 LAB — CBC
HCT: 24.9 % — ABNORMAL LOW (ref 39.0–52.0)
HCT: 17.7 % — ABNORMAL LOW (ref 39.0–52.0)
Hemoglobin: 8.2 g/dL — ABNORMAL LOW (ref 13.0–17.0)
Hemoglobin: 5.8 g/dL — CL (ref 13.0–17.0)
MCH: 32.3 pg (ref 26.0–34.0)
MCH: 32.2 pg (ref 26.0–34.0)
MCHC: 32.9 g/dL (ref 30.0–36.0)
MCHC: 32.8 g/dL (ref 30.0–36.0)
MCV: 98 fL (ref 80.0–100.0)
MCV: 98.3 fL (ref 80.0–100.0)
NRBC: 0 % (ref 0.0–0.2)
Platelets: 261 10*3/uL (ref 150–400)
Platelets: 200 10*3/uL (ref 150–400)
RBC: 2.54 MIL/uL — ABNORMAL LOW (ref 4.22–5.81)
RBC: 1.8 MIL/uL — ABNORMAL LOW (ref 4.22–5.81)
RDW: 12.8 % (ref 11.5–15.5)
RDW: 12.9 % (ref 11.5–15.5)
WBC: 17.7 10*3/uL — ABNORMAL HIGH (ref 4.0–10.5)
WBC: 16.5 10*3/uL — ABNORMAL HIGH (ref 4.0–10.5)
nRBC: 0 % (ref 0.0–0.2)

## 2018-01-09 LAB — HEMOGLOBIN AND HEMATOCRIT, BLOOD
HCT: 19.2 % — ABNORMAL LOW (ref 39.0–52.0)
Hemoglobin: 6.3 g/dL — CL (ref 13.0–17.0)

## 2018-01-09 LAB — GLUCOSE, CAPILLARY
Glucose-Capillary: 155 mg/dL — ABNORMAL HIGH (ref 70–99)
Glucose-Capillary: 131 mg/dL — ABNORMAL HIGH (ref 70–99)

## 2018-01-09 LAB — PREPARE RBC (CROSSMATCH)

## 2018-01-09 LAB — HEPARIN LEVEL (UNFRACTIONATED): HEPARIN UNFRACTIONATED: 0.67 [IU]/mL (ref 0.30–0.70)

## 2018-01-09 MED ORDER — OXYCODONE-ACETAMINOPHEN 5-325 MG PO TABS
1.0000 | ORAL_TABLET | Freq: Four times a day (QID) | ORAL | Status: DC | PRN
  Administered 2018-01-09: 1 via ORAL
  Filled 2018-01-09: qty 1

## 2018-01-09 MED ORDER — SODIUM CHLORIDE 0.9 % IV BOLUS
500.0000 mL | Freq: Once | INTRAVENOUS | Status: AC
  Administered 2018-01-09: 500 mL via INTRAVENOUS

## 2018-01-09 MED ORDER — IOPAMIDOL (ISOVUE-370) INJECTION 76%
100.0000 mL | Freq: Once | INTRAVENOUS | Status: AC | PRN
  Administered 2018-01-09: 100 mL via INTRAVENOUS

## 2018-01-09 MED ORDER — SODIUM CHLORIDE 0.9% IV SOLUTION
Freq: Once | INTRAVENOUS | Status: AC
  Administered 2018-01-09: 18:00:00 via INTRAVENOUS

## 2018-01-09 NOTE — Progress Notes (Signed)
RR at bedside.

## 2018-01-09 NOTE — Care Management Important Message (Signed)
Important Message  Patient Details  Name: [name] MRN: [id number] Date of Birth: 01-Jun-1950   Medicare Important Message Given:  Yes    Barb Merino Giomar Gusler 01/09/2018, 3:51 PM

## 2018-01-09 NOTE — Progress Notes (Signed)
[place] for heparin Indication: chest pain/ACS  Allergies  Allergen Reactions  . Nsaids Shortness Of Breath  . Bee Venom Swelling    Neck swelling, passes out  . Codeine Other (See Comments)    Causes GI Upset    Patient Measurements: Height: 5\' 10"  (177.8 cm) Weight: 147 lb 14.9 oz (67.1 kg) IBW/kg (Calculated) : 73   Vital Signs: Temp: 98 F (36.7 C) (12/20 0454) Temp Source: Oral (12/20 0454) BP: 132/65 (12/20 0552) Pulse Rate: 67 (12/20 0552)  Labs: Recent Labs    01/07/18 0514 01/07/18 0855 01/08/18 0004 01/08/18 0437 01/09/18 0404  HGB 12.0*  --   --  9.2* 8.2*  HCT 37.8*  --   --  29.0* 24.9*  PLT 251  --   --  204 261  APTT  --   --  115*  --   --   LABPROT  --   --  13.9  --   --   INR  --   --  1.08  --   --   HEPARINUNFRC  --  0.76*  --  0.51 0.67  CREATININE 1.00  --   --   --  1.36*  TROPONINI 0.08*  --   --   --   --     Estimated Creatinine Clearance: 50.7 mL/min (A) (by C-G formula based on SCr of 1.36 mg/dL (H)).  Assessment: 67 y/o male transferred from [facility] to [facility] for chest pain with elevated troponin. Plan is for cath today. Pharmacy consulted to manage IV heparin.  Heparin level is therapeutic today at 0.67 on 1050 units/hr. No bleeding noted, Hgb has decreased over admit, 13.1 >> 8.2 - monitor; platelets are normal.  Goal of Therapy:  Heparin level 0.3-0.7 units/ml Monitor platelets by anticoagulation protocol: Yes   Plan:  Continue heparin infusion at 1050 units/hr Check anti-Xa level daily while on heparin Continue to monitor H&H and platelets F/U after cath   Renold Genta, PharmD, BCPS Clinical Pharmacist Clinical phone for 01/09/2018 until 3p is [phone] 01/09/2018 7:52 AM  **Pharmacist phone directory can now be found on [email or web address] listed under [place]**

## 2018-01-09 NOTE — Progress Notes (Addendum)
PROGRESS NOTE  [name] STM:[id number] DOB: 1950-02-16 DOA: 01/05/2018 PCP: Shelbie Ammons, MD  HPI/Recap of past 24 hours: Brief Narrative:  67 year old male with a history of COPD, chronic aortic dissection, coronary artery disease, hypertension, presents to the hospital with complaints of shortness of breath.  He was noted to be extremely anxious, had shortness of breath and wheezing.  He was recently discharged from the hospital after being treated for COPD exacerbation.  He presented with tachycardia with a heart rate in the 120s, mild elevation of troponin and new T wave inversions on EKG.  He was admitted for further treatments.  Started on medical management for elevated troponin with cardiology following.  He is also being treated for COPD exacerbation. Anxiety and pain back pain also playing a role in his symptoms.  Subjective: Patient seen at bedside.  Patient is complaining of abdominal pain as well as back pain.  His blood pressure had dropped as well as his hemoglobin dropping from 12-9 this morning and his concern for extension of his dissection. His troponin is elevated he has prior history of non-STEMI He is having acute back pain with drop in his blood pressure which was concerning for extension of his dissection cardiology has been consulted and they ordered CT which is showing 2 new large retroperitoneal bleed but the dissection appears stable no major arterial source of bleeding.  Cardiology has seen patient and has decided not to pursue cardiac cath at this time probably deferred till Monday also heparin has been stopped.  Addendum: Patient had an episode of unresponsiveness with low blood pressure that was 64/35 rapid response was called he was given a bolus of IV fluid with response of his blood pressure the last blood pressure was 92/54.  His hemoglobin also dropped from 8.0 this morning to 6.3 critical care consult was obtained.  Patient was transferred to  ICU. Assessment/Plan: Active Problems:   HTN (hypertension)   CAD (coronary artery disease)   Hypercholesterolemia   Elevated troponin   Descending thoracic dissection (HCC)   COPD exacerbation (HCC)   Acute on chronic respiratory failure with hypoxia (HCC)   Anxiety   Ischemic chest pain (HCC)  1 descending thoracic dissection, with prior type B aortic dissection that extends from left subclavian to renal arteries.  Patient has 2 new retroperitoneal patient has been seen by cardiology and they suggest no need for any intervention at this time no cardiac catheterization and his heparin has been discontinued  2.  Chronic low back pain with spinal stenosis requiring repeated narcotic which resulted in altered mental status due to overmedication.  Patient is still in pain and requesting medication for pain will continue to manage his pain he will be receiving some narcotics his Percocet is due to be given every 8 hours I will reduce it to every 6  3.  Abdominal pain with history of AVM.  4.  Hypotension, with back pain there is a concern for expansion of dissection of his aortic aneurysm.  His heparin has been held cardiology is aware.  Close monitoring will be done.  he received bolus of fluids we will continue to monitor fluid status and replace IV fluid as needed  5.  Anemia, sudden drop of hemoglobin from 12 to 8 in 2 days to monitor H&H and transfuse as needed  6.  Chronic hypoxic respiratory failure on continuous home O2 at 3 to 4 L at baseline  7.  Coronary artery disease status post CABG  in 2009  Code Status: Full  Severity of Illness: The appropriate patient status for this patient is INPATIENT. Inpatient status is judged to be reasonable and necessary in order to provide the required intensity of service to ensure the patient's safety. The patient's presenting symptoms, physical exam findings, and initial radiographic and laboratory data in the context of their chronic  comorbidities is felt to place them at high risk for further clinical deterioration. Furthermore, it is not anticipated that the patient will be medically stable for discharge from the hospital within 2 midnights of admission. The following factors support the patient status of inpatient.   " The patient's presenting symptoms include persistent pain. " The worrisome physical exam findings include aortic dissection abdominal pain back pain hip pain. " The initial radiographic and laboratory data are worrisome because of aortic dissection. " The chronic co-morbidities include spinal stenosis and AVM.   * I certify that at the point of admission it is my clinical judgment that the patient will require inpatient hospital care spanning beyond 2 midnights from the point of admission due to high intensity of service, high risk for further deterioration and high frequency of surveillance required.*    Family Communication: None  Disposition Plan: To be determined   Consultants:  Cardiology Dr. Al Pimple  Procedures:  CT scan CTA  Antimicrobials:  None  DVT prophylaxis: Heparin but that is on hold we will change it to SCD     Objective: Vitals:   01/09/18 0552 01/09/18 0806 01/09/18 0833 01/09/18 0852  BP: 132/65 (!) 100/56  112/72  Pulse: 67 74  75  Resp: 16 19    Temp:  97.6 F (36.4 C)    TempSrc:  Oral    SpO2:  97% 99%   Weight:      Height:        Intake/Output Summary (Last 24 hours) at 01/09/2018 1137 Last data filed at 01/08/2018 2200 Gross per 24 hour  Intake 120 ml  Output 450 ml  Net -330 ml   Filed Weights   01/05/18 1101 01/05/18 1950 01/06/18 0617  Weight: 75 kg 67.1 kg 67.1 kg   Body mass index is 21.23 kg/m.  Exam:  . General: 67 y.o. year-old male well developed well nourished in no acute distress.  Alert and oriented x3.  Patient is uncomfortable with pain . Cardiovascular: Regular rate and rhythm with no rubs or gallops.  No  thyromegaly or JVD noted.   Marland Kitchen Respiratory: Clear to auscultation with no wheezes or rales. Good inspiratory effort. . Abdomen: Soft ,tender nondistended with normal bowel sounds x4 quadrants. . Musculoskeletal: Tenderness in the lower back no lower extremity edema. 2/4 pulses in all 4 extremities. . Skin: No ulcerative lesions noted or rashes, . Psychiatry: Mood is appropriate for condition and setting    Data Reviewed: CBC: Recent Labs  Lab 01/05/18 1226 01/06/18 0210 01/07/18 0514 01/08/18 0437 01/09/18 0404  WBC 13.1* 11.4* 9.0 11.8* 17.7*  NEUTROABS 10.8*  --   --   --   --   HGB 13.1 12.6* 12.0* 9.2* 8.2*  HCT 43.0 40.1 37.8* 29.0* 24.9*  MCV 101.7* 99.3 99.2 101.0* 98.0  PLT 223 230 251 204 191   Basic Metabolic Panel: Recent Labs  Lab 01/04/18 0632 01/05/18 1226 01/07/18 0514 01/09/18 0404  NA 136 140 135 135  K 4.5 4.0 4.4 4.8  CL 96* 98 92* 91*  CO2 33* 33* 33* 31  GLUCOSE 154* 146* 170* 207*  BUN 43* 31* 37* 53*  CREATININE 1.12 0.87 1.00 1.36*  CALCIUM 8.3* 8.7* 8.4* 8.0*   GFR: Estimated Creatinine Clearance: 50.7 mL/min (A) (by C-G formula based on SCr of 1.36 mg/dL (H)). Liver Function Tests: No results for input(s): AST, ALT, ALKPHOS, BILITOT, PROT, ALBUMIN in the last 168 hours. No results for input(s): LIPASE, AMYLASE in the last 168 hours. Recent Labs  Lab 01/08/18 0054  AMMONIA 27   Coagulation Profile: Recent Labs  Lab 01/08/18 0004  INR 1.08   Cardiac Enzymes: Recent Labs  Lab 01/05/18 1507 01/05/18 2047 01/06/18 0210 01/07/18 0514  TROPONINI 0.16* 0.12* 0.19* 0.08*   BNP (last 3 results) No results for input(s): PROBNP in the last 8760 hours. HbA1C: No results for input(s): HGBA1C in the last 72 hours. CBG: Recent Labs  Lab 01/08/18 0000  GLUCAP 158*   Lipid Profile: No results for input(s): CHOL, HDL, LDLCALC, TRIG, CHOLHDL, LDLDIRECT in the last 72 hours. Thyroid Function Tests: No results for input(s): TSH,  T4TOTAL, FREET4, T3FREE, THYROIDAB in the last 72 hours. Anemia Panel: No results for input(s): VITAMINB12, FOLATE, FERRITIN, TIBC, IRON, RETICCTPCT in the last 72 hours. Urine analysis:    Component Value Date/Time   COLORURINE YELLOW 12/31/2017 2047   APPEARANCEUR CLEAR 12/31/2017 2047   LABSPEC 1.025 12/31/2017 2047   PHURINE 5.0 12/31/2017 2047   GLUCOSEU 250 (A) 12/31/2017 2047   HGBUR MODERATE (A) 12/31/2017 2047   BILIRUBINUR NEGATIVE 12/31/2017 2047   KETONESUR NEGATIVE 12/31/2017 2047   PROTEINUR 100 (A) 12/31/2017 2047   UROBILINOGEN 0.2 08/26/2012 0930   NITRITE NEGATIVE 12/31/2017 2047   LEUKOCYTESUR NEGATIVE 12/31/2017 2047   Sepsis Labs: @LABRCNTIP (procalcitonin:4,lacticidven:4)  ) Recent Results (from the past 240 hour(s))  Culture, blood (Routine x 2)     Status: None   Collection Time: 12/31/17 12:08 PM  Result Value Ref Range Status   Specimen Description BLOOD LEFT FOREARM  Final   Special Requests   Final    BOTTLES DRAWN AEROBIC AND ANAEROBIC Blood Culture results may not be optimal due to an excessive volume of blood received in culture bottles   Culture   Final    NO GROWTH 5 DAYS Performed at [facility], [address], [place], [place] [address]    Report Status 01/05/2018 FINAL  Final  Culture, blood (Routine x 2)     Status: None   Collection Time: 12/31/17 12:14 PM  Result Value Ref Range Status   Specimen Description BLOOD LEFT HAND  Final   Special Requests   Final    BOTTLES DRAWN AEROBIC AND ANAEROBIC Blood Culture results may not be optimal due to an inadequate volume of blood received in culture bottles   Culture   Final    NO GROWTH 5 DAYS Performed at [facility], [address], [place], [place] [address]    Report Status 01/05/2018 FINAL  Final  MRSA PCR Screening     Status: None   Collection Time: 01/05/18  7:53 PM  Result Value Ref Range Status   MRSA by PCR NEGATIVE NEGATIVE Final    Comment:        The GeneXpert MRSA Assay  (FDA approved for NASAL specimens only), is one component of a comprehensive MRSA colonization surveillance program. It is not intended to diagnose MRSA infection nor to guide or monitor treatment for MRSA infections. Performed at [facility], [address], [place], [place] [address]       Studies: Ct Angio Chest/abd/pel For  Dissection W And/or W/wo  Result Date: 01/09/2018 CLINICAL DATA:  Acute drop in hemoglobin over the last 2 days with severe back pain and known chronic type B dissection involving the aortic arch, descending thoracic aorta and abdominal aorta. EXAM: CT ANGIOGRAPHY CHEST, ABDOMEN AND PELVIS TECHNIQUE: Multidetector CT imaging through the chest, abdomen and pelvis was performed using the standard protocol during bolus administration of intravenous contrast. Multiplanar reconstructed images and MIPs were obtained and reviewed to evaluate the vascular anatomy. CONTRAST:  161mL ISOVUE-370 IOPAMIDOL (ISOVUE-370) INJECTION 76% COMPARISON:  Prior CTA of the chest on 01/05/2018. CTA of the chest, abdomen and pelvis on 04/24/2017. FINDINGS: CTA CHEST FINDINGS Cardiovascular: Stable appearance of chronic dissection beginning at the origin of the left subclavian artery and extending down the descending thoracic aorta. Maximum diameter of the distal arch is 4.5 cm which is stable. Maximum diameter of the descending thoracic aorta is approximately 4 cm and is stable. Proximal great vessels show stable and normal patency. There is stable irregular outpouching of the true lumen into the false lumen at the top of the aortic arch measuring roughly 2.7 cm in greatest diameter. No evidence of acute hemorrhage in the chest. The heart size is stable. No pericardial fluid identified. The ascending thoracic aorta is normal in caliber and shows no evidence of dissection. Central pulmonary arteries are normal in caliber and well opacified demonstrating normal patency. Mediastinum/Nodes: No enlarged  mediastinal, hilar, or axillary lymph nodes. Thyroid gland, trachea, and esophagus demonstrate no significant findings. Lungs/Pleura: Stable emphysematous lung disease. There is no evidence of pulmonary edema, consolidation, pneumothorax, nodule or pleural fluid. Musculoskeletal: Beginning in the lower right chest wall, there is evidence of hemorrhage anteriorly continuing into the upper abdominal wall. This is further described in the CTA abdomen report below. Review of the MIP images confirms the above findings. CTA ABDOMEN AND PELVIS FINDINGS VASCULAR Aorta: Stable evidence of dissection extending down the abdominal aorta and into the proximal right common iliac artery. Stable narrowing of the proximal right common iliac artery. Stable aneurysmal dilatation of the abdominal aorta measuring approximately 4.6 x 4.7 cm. No evidence of aneurysm rupture. Celiac: . small caliber celiac trunk emanating off of the true lumen and otherwise demonstrating normal patency. SMA: SMA originates off of the true lumen and demonstrates normal patency. Renals: Bilateral renal arteries emanate off of the true lumen and demonstrate normal patency. 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 Inflow: Bilateral iliac artery patency stable with stable stenosis of the proximal right common iliac artery of approximately 60-70%. Stable small caliber bilateral external iliac arteries. Review of the MIP images confirms the above findings. NON-VASCULAR Hepatobiliary: No focal liver abnormality is seen. No gallstones, gallbladder wall thickening, or biliary dilatation. Pancreas: Unremarkable. No pancreatic ductal dilatation or surrounding inflammatory changes. Spleen: Normal in size without focal abnormality. Adrenals/Urinary Tract: Adrenal glands are unremarkable. Kidneys are normal, without renal calculi, focal lesion, or hydronephrosis. Bladder is unremarkable. Stomach/Bowel: No bowel obstruction, ileus or free air. No phlegm a tori process  or mass identified. Lymphatic: No enlarged lymph nodes identified. Reproductive: Prostate is unremarkable. Other: There are 2 separate areas of large spontaneous hemorrhage. Large hematoma of the lower anterior right chest wall extends into the upper right rectus sheath. Largest dimensions of the area of hemorrhage are roughly 14.5 x 6.0 x 12.6 cm. There also is spontaneous hemorrhage in the right retroperitoneum involving the psoas muscle and extending laterally into the retroperitoneal space. Intramuscular psoas hemorrhage extends into the pelvis. Largest  dimensions of the right retroperitoneal bleed are roughly 8.2 x 6.4 x 16 cm. Musculoskeletal: No acute or significant osseous findings. Review of the MIP images confirms the above findings. IMPRESSION: 1. No change in appearance of the chronic type B aortic dissection involving the distal arch, descending thoracic aorta and abdominal aorta. No evidence to suggest rupture or hemorrhage emanating from the aorta. 2. New spontaneous large hemorrhages involving the lower right anterior chest wall extending into the upper right rectus abdominal musculature and the right retroperitoneum involving the right iliopsoas muscle extending into the pelvis. 3. These results were called by telephone at the time of interpretation on 01/09/2018 at 11:15 am to Dr. Buford Dresser , who verbally acknowledged these results. Electronically Signed   By: Aletta Edouard M.D.   On: 01/09/2018 11:30    Scheduled Meds: . aspirin  81 mg Oral Daily  . atorvastatin  20 mg Oral q1800  . budesonide (PULMICORT) nebulizer solution  0.5 mg Nebulization BID  . busPIRone  7.5 mg Oral TID  . dextromethorphan-guaiFENesin  1 tablet Oral BID  . feeding supplement (ENSURE ENLIVE)  237 mL Oral BID BM  . gabapentin  300 mg Oral TID  . levothyroxine  50 mcg Oral Q0600  . losartan  50 mg Oral Daily  . metoprolol tartrate  25 mg Oral BID  . nicotine  21 mg Transdermal Q24H  .  nitroGLYCERIN  0.5 inch Topical Q6H  . pantoprazole  40 mg Oral QHS  . predniSONE  40 mg Oral Q breakfast  . sodium chloride flush  3 mL Intravenous Q12H    Continuous Infusions: . sodium chloride    . sodium chloride 50 mL/hr at 01/09/18 0501     LOS: 3 days     Cristal Deer, MD [facility]  To reach me or the doctor on call, go to: [email or web address] Password [email or web address]  01/09/2018, 11:37 AM

## 2018-01-09 NOTE — Significant Event (Addendum)
Rapid Response Event Note  Overview: Called to assist with hypotension Time Called: 1438 Arrival Time: 1440 Event Type: Hypotension  Initial Focused Assessment:  Supine in bed - pale and sticky oral mucosa -  - mild grimace response to sternal rub - warm and dry - resps regular and unlabored - bil BS present - clear - abd round soft - monitor with SB 58 - BP 75/39 RR 20 O2 sats 100% on 3 liters nasal cannula. CBG 155. RN states he has not voided today.  2 PIV patent.  NS bolus infusing. BP as low as 62/41.   Interventions:  Continue NS bolus - Dr. Kyung Bacca to bedside - she has called PCCM - BP responding to fluids - now 83/59 HR 65.  Mental status improving - will open eyes to name - falls back to sleep - moves all extremities to pain. 12 lead EKG.  RN Lorenza Chick reports Percocet around 11 this am - NTG patch removed.  MAR reviewed.   Bladder scan done - 135 cc noted.  BP improving - mental status improving - gripped with right hand - focusing.  Nickolas Madrid NP to bedside - transferring to [phone].  BP 113/50 with NS wide open.  Hgb results 6.2 with Hct 19 reported by [facility].  Will need TCXM and transfusion.  Handoff to [facility] [employer] in [place] - patient more alert.  BP more stable.  Has been incontinent with transfer - ? When abd pressed with bladder scan.    Plan of Care (if not transferred):  Event Summary: Name of Physician Notified: Dr. Kyung Bacca at (pta RRT)  Name of Consulting Physician Notified: PCCM     Nickolas Madrid to bedside at (by dr. Kyung Bacca)  Outcome: Transferred (Comment)  Event End Time: 1530  Quin Hoop

## 2018-01-09 NOTE — Progress Notes (Signed)
Progress Note  Patient Name: [name] Date of Encounter: 01/09/2018  Primary Cardiologist: Candee Furbish, MD  Subjective   Patient with severe lower back pain this AM. Hgb has gone from 12 to 8 in two days. No clear source of bleeding. He has had several days of pain requiring significant pain meds, but this AM he was hypotensive as well. Concern given his known prior type B aortic dissection that extends from left subclavian to renal arteries.  Inpatient Medications    Scheduled Meds: . aspirin  81 mg Oral Daily  . atorvastatin  20 mg Oral q1800  . budesonide (PULMICORT) nebulizer solution  0.5 mg Nebulization BID  . busPIRone  7.5 mg Oral TID  . dextromethorphan-guaiFENesin  1 tablet Oral BID  . feeding supplement (ENSURE ENLIVE)  237 mL Oral BID BM  . gabapentin  300 mg Oral TID  . levothyroxine  50 mcg Oral Q0600  . losartan  50 mg Oral Daily  . metoprolol tartrate  25 mg Oral BID  . nicotine  21 mg Transdermal Q24H  . nitroGLYCERIN  0.5 inch Topical Q6H  . pantoprazole  40 mg Oral QHS  . predniSONE  40 mg Oral Q breakfast  . sodium chloride flush  3 mL Intravenous Q12H   Continuous Infusions: . sodium chloride    . sodium chloride 50 mL/hr at 01/09/18 0501  . heparin 1,050 Units/hr (01/07/18 1349)   PRN Meds: sodium chloride, acetaminophen **OR** acetaminophen, ALPRAZolam, hydrALAZINE, HYDROcodone-homatropine, ipratropium-albuterol, ondansetron **OR** ondansetron (ZOFRAN) IV, oxyCODONE-acetaminophen, polyethylene glycol, sodium chloride flush, traZODone   Vital Signs    Vitals:   01/09/18 0552 01/09/18 0806 01/09/18 0833 01/09/18 0852  BP: 132/65 (!) 100/56  112/72  Pulse: 67 74  75  Resp: 16 19    Temp:  97.6 F (36.4 C)    TempSrc:  Oral    SpO2:  97% 99%   Weight:      Height:        Intake/Output Summary (Last 24 hours) at 01/09/2018 0907 Last data filed at 01/08/2018 2200 Gross per 24 hour  Intake 120 ml  Output 450 ml  Net -330 ml   Filed  Weights   01/05/18 1101 01/05/18 1950 01/06/18 0617  Weight: 75 kg 67.1 kg 67.1 kg    Telemetry    NSR - Personally Reviewed  ECG    SR, PACs - Personally Reviewed  Physical Exam   GEN: Initially restless in bed, on interview complains of back pain  Neck: No JVD Cardiac: RRR, no murmurs, rubs, or gallops. Palpable bilateral radial and DP pulses Respiratory: coarse bilaterally, no significant wheezing this AM GI: Soft, nontender, non-distended  MS: No edema; No deformity. Neuro:  Nonfocal  Psych: Normal affect   Labs    Chemistry Recent Labs  Lab 01/05/18 1226 01/07/18 0514 01/09/18 0404  NA 140 135 135  K 4.0 4.4 4.8  CL 98 92* 91*  CO2 33* 33* 31  GLUCOSE 146* 170* 207*  BUN 31* 37* 53*  CREATININE 0.87 1.00 1.36*  CALCIUM 8.7* 8.4* 8.0*  GFRNONAA >60 >60 54*  GFRAA >60 >60 >60  ANIONGAP 9 10 13      Hematology Recent Labs  Lab 01/07/18 0514 01/08/18 0437 01/09/18 0404  WBC 9.0 11.8* 17.7*  RBC 3.81* 2.87* 2.54*  HGB 12.0* 9.2* 8.2*  HCT 37.8* 29.0* 24.9*  MCV 99.2 101.0* 98.0  MCH 31.5 32.1 32.3  MCHC 31.7 31.7 32.9  RDW 13.1 13.1 12.8  PLT 251 204 261    Cardiac Enzymes Recent Labs  Lab 01/05/18 1507 01/05/18 2047 01/06/18 0210 01/07/18 0514  TROPONINI 0.16* 0.12* 0.19* 0.08*    Recent Labs  Lab 01/05/18 1231  TROPIPOC 0.17*     BNP Recent Labs  Lab 01/05/18 1226  BNP 422.0*     DDimer No results for input(s): DDIMER in the last 168 hours.   Radiology    Ct Head Code Stroke Wo Contrast  Result Date: 01/08/2018 CLINICAL DATA:  Code stroke. EXAM: CT HEAD WITHOUT CONTRAST TECHNIQUE: Contiguous axial images were obtained from the base of the skull through the vertex without intravenous contrast. COMPARISON:  Head CT 05/11/2015 FINDINGS: Brain: There is no mass, hemorrhage or extra-axial collection. There is generalized atrophy without lobar predilection. Areas of hypoattenuation of the deep gray nuclei and confluent  periventricular white matter hypodensity, consistent with chronic small vessel disease. Vascular: Atherosclerotic calcification of the vertebral and internal carotid arteries at the skull base. No abnormal hyperdensity of the major intracranial arteries or dural venous sinuses. Skull: The visualized skull base, calvarium and extracranial soft tissues are normal. Sinuses/Orbits: No fluid levels or advanced mucosal thickening of the visualized paranasal sinuses. No mastoid or middle ear effusion. The orbits are normal. ASPECTS [facility] Stroke Program Early CT Score) - Ganglionic level infarction (caudate, lentiform nuclei, internal capsule, insula, M1-M3 cortex): 7 - Supraganglionic infarction (M4-M6 cortex): 3 Total score (0-10 with 10 being normal): 10 IMPRESSION: 1. CLINICAL DATA:  Code stroke. EXAM: CT HEAD WITHOUT CONTRAST TECHNIQUE: Contiguous axial images were obtained from the base of the skull through the vertex without intravenous contrast. COMPARISON:  Head CT 05/11/2015 FINDINGS: Brain: There is no mass, hemorrhage or extra-axial collection. There is generalized atrophy without lobar predilection. Areas of hypoattenuation of the deep gray nuclei and confluent periventricular white matter hypodensity, consistent with chronic small vessel disease. Vascular: Atherosclerotic calcification of the vertebral and internal carotid arteries at the skull base. No abnormal hyperdensity of the major intracranial arteries or dural venous sinuses. Skull: The visualized skull base, calvarium and extracranial soft tissues are normal. Sinuses/Orbits: No fluid levels or advanced mucosal thickening of the visualized paranasal sinuses. No mastoid or middle ear effusion. The orbits are normal. ASPECTS [facility] Stroke Program Early CT Score) - Ganglionic level infarction (caudate, lentiform nuclei, internal capsule, insula, M1-M3 cortex): 7 - Supraganglionic infarction (M4-M6 cortex): 3 Total score (0-10 with 10 being normal):  10 IMPRESSION: 1. No acute hemorrhage or mass effect. 2. ASPECTS is 10. 3. Generalized atrophy and findings of chronic ischemic microangiopathy, including multiple old lacunar infarcts. These results were called by telephone at the time of interpretation on 01/08/2018 at 12:28 am to Dr. Derrill Kay , who verbally acknowledged these results. Electronically Signed   By: Ulyses Jarred M.D.   On: 01/08/2018 00:30    Cardiac Studies   Echo 01/06/18 - Left ventricle: The cavity size was normal. Wall thickness was   increased in a pattern of moderate LVH. Systolic function was   normal. The estimated ejection fraction was in the range of 55%   to 60%. - Aortic valve: Mildly calcified annulus. Mildly thickened   leaflets. Valve area (VTI): 2.26 cm^2. Valve area (Vmax): 2.26   cm^2. - Mitral valve: Mildly calcified annulus. Mildly thickened leaflets - Technically difficult study.  CT angio 12/16 1. Previously demonstrated Stanford type B dissection of the aorta beginning just beyond the origin of the left subclavian artery and extending into the upper abdomen and  into the proximal left renal artery, causing approximately 90+% stenosis of the proximal left renal artery. 2. Interval thrombosis of the false lumen of the dissection with the exception of a small amount of persistent opacified blood in the aortic arch superiorly on the left. 3. Mild changes of COPD. 4. Calcific coronary artery and aortic atherosclerosis and post CABG changes.  Patient Profile     67 y.o. male w/ PMHof CAD (s/p CABG in 2009 with cath in 02/2008 showing occluded SVG-PDA and SVG-OM, repeat cath in 02/2015 showing patency of LIMA-LAD, SVG-D1, and free RIMA-Ramus and PCI/DES of native RCA), Type B aortic dissection, chronic hypoxic respiratory failure (on 3-4L [place] at baseline), HLD, and orthostatic hypotensionwho [facility] to [facility] for worsening dyspnea. Transferred to [facility] for  cath.  Assessment & Plan    1. Chest pain/ Elevated troponins - He has history of CAD with prior CABG, last cath 2017 where he received a stent to his native RCA. At that time LIMA-LAD patent, SVG-diag patent, RIMA-raums patent, occluded SVG-PDA and occluded SVG-OM.  - presented with worsening dyspnea after having recently been discharged for a COPD exacerbation.Cyclic troponin valueshave beenflat at0.16, 0.12, and 0.19 peak this admissionbut EKG tracings do show new TWI along the inferolateral leads - Echo this admit LVEF 55-60%. - convoluted chest pain symptoms in setting of COPD exacerbation - Unclear if demand ischemia in setting of COPD exacerbation and chronic occlusive CAD or a component of acute obstructive CAD. Continue to follow trop trend, respiraotry status would need to be improved for either invasive or noninvasive ischemic testing. Continue medical therapy with hep gtt, ASA, statin, ARB, beta blocker - plan for cath once respiratory status has improved  Back pain, drop in Hgb, hypotension: Concern for expansion of dissection  Hgb has gone from 12.0 2 days ago to 8.2 today. No clear gross bleeding. While he has had pain issues in the past, he is having severe sacral area pain and had hypotension this AM. His creatinine is also over his baseline  While it is not ideal for him to have potentially multiple contrast loads in close proximity, my concern is for expansion or small rupture of dissection. I have ordered stat CT angio of the aorta to evaluate for this. Given the unclear Hgb drop, I would hold on cath at this time. If CT angio unremarkable, he needs a workup for blood loss prior to undergoing cath. I would plan for cath on Monday 12/23 at the earliest pending further workup.  For questions or updates, please contact [place] Please consult [email or web address] for contact info under     Signed, Buford Dresser, MD  01/09/2018, 9:07 AM

## 2018-01-09 NOTE — Consult Note (Signed)
NAME:  BECKY BERBERIAN, MRN:  [id number], DOB:  May 29, 1950, LOS: 3 ADMISSION DATE:  01/05/2018, CONSULTATION DATE:  12/20 REFERRING MD:  Kyung Bacca CHIEF COMPLAINT:  Hypotension, retroperitoneal bleed    Brief History   67yo male with hx chronic hypoxic respiratory failure on 3-4L home O2 r/t COPD, CAD s/p CABG 2009 with known severe 3V disease on cath 2017, HTN, chronic type B aortic dissection with recent admission for AECOPD, re-admitted 12/16 to [facility] with chest pain and SOB.  W/u neg for PE but did reveal elevated troponin (0.16, 0.12, 0.19) and some EKG changes and was therefor seen by cardiology, started on heparin gtt and ASA and tx to [facility] for tentative cardiac cath.  On 12/20 developed severe back pain, hypotension with SBP 60's and AMS and CT abd revealed large retroperitoneal bleed. PCCM consulted for ICU tx.   History of present illness   67yo male with hx chronic hypoxic respiratory failure on 3-4L home O2 r/t COPD, CAD s/p CABG 2009 with known severe 3V disease on cath 2017, HTN, chronic type B aortic dissection with recent admission for AECOPD, re-admitted 12/16 to [facility] with chest pain and SOB.  W/u neg for PE but did reveal elevated troponin (0.16, 0.12, 0.19) and some EKG changes and was therefor seen by cardiology, started on heparin gtt and ASA and tx to [facility] for tentative cardiac cath.  On 12/20 developed severe back pain, hypotension with SBP 60's and AMS and CT abd revealed large retroperitoneal bleed. PCCM consulted for ICU tx.   Past Medical History   has a past medical history of Anemia, Anxiety, Aortic dissection ([place]), CAD (coronary artery disease), Cardiac tamponade, Chronic bronchitis (HCC), Chronic fatigue, Chronic pain, Constipation, COPD (chronic obstructive pulmonary disease) (HCC), Depression, Dysphagia, oropharyngeal phase, ETOH abuse, GERD (gastroesophageal reflux disease), Hiatal hernia, HTN (hypertension), Hypercholesterolemia, Metabolic encephalopathy, Opioid  abuse ([place]), Peptic ulcer disease (08/2010), PNA (pneumonia), and Schatzki's ring.   Significant Hospital Events     Consults:  Cardiology 12/17>>> PCCM   Procedures:    Significant Diagnostic Tests:  CTA chest 12/16>>> 1. 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 2. Interval thrombosis of the false lumen of the dissection with the exception of a small amount of persistent opacified blood in the aortic arch superiorly on the left. 3. Mild changes of COPD. 4. Calcific coronary artery and aortic atherosclerosis and post CABG changes CT head 12/19>>> neg acute  CTA chest/abd/pelvis 12/20>>> 1. No change in appearance of the chronic type B aortic dissection involving the distal arch, descending thoracic aorta and abdominal aorta. No evidence to suggest rupture or hemorrhage emanating from the aorta.  2. New spontaneous large hemorrhages involving the lower right anterior chest wall extending into the upper right rectus abdominal musculature and the right retroperitoneum involving the right iliopsoas muscle extending into the pelvis.  Micro Data:    Antimicrobials:     Interim history/subjective:  RRT at bedside.  BP improving with volume. Awaiting blood. Mental status improving with improved BP.  Last Hgb 6.3  Objective   Blood pressure (!) 114/40, pulse 72, temperature (!) 97.2 F (36.2 C), temperature source Oral, resp. rate 15, height 5\' 10"  (1.778 m), weight 67.1 kg, SpO2 100 %.        Intake/Output Summary (  Last 24 hours) at 01/09/2018 1601 Last data filed at 01/09/2018 1420 Gross per 24 hour  Intake 120 ml  Output 450 ml  Net -330 ml   Filed Weights   01/05/18 1101 01/05/18 1950 01/06/18 0617  Weight: 75 kg 67.1 kg 67.1 kg     Examination: General: chronically ill appearing male, NAD  HENT: mm moist, no JVD Lungs: resps even non labored, few mild exp wheezes otherwise clear  Cardiovascular: s1s2 rrr, NSR 60's Abdomen: round, slightly distended, tender diffusely Extremities: cool and dry, slightly mottling to knees, L upper back light shadow bruising  Neuro: somnolent but easily arousable, MAE  Resolved Hospital Problem list     Assessment & Plan:   Hypotension/ hemorrhagic shock - in setting retroperitoneal/chest wall bleed.  Blood loss anemia  PLAN -  Volume resuscitation  2 units PRBC asap  Monitor BP closely, may need low dose peripheral neo while waiting for PRBC  Heparin gtt off  q6 CBC D/c B blockers   Large chest wall/ retroperitoneal bleed  large hemorrhages involving the lower right anterior chest wall extending into the upper right rectus abdominal musculature and the right retroperitoneum involving the right iliopsoas muscle extending into the pelvis. PLAN -  Heparin gtt off  q6h CBC as above    AMS - improved with volume  PLAN -  Supportive care  Monitor airway protection  D/c sedating medications   CAD  NSTEMI  PLAN -  Cardiology following  Heparin gtt off  Trend troponin  Hold off on cath for now given bleed    Chronic hypoxic respiratory failure on 3-4L home O2  COPD PLAN -  Continue po prednisone  Supplemental O2 as needed  BD's   Chronic low back pain  PLAN -  D/c sedating medications for now   Known chronic type B aortic dissection - stable on CT  PLAN -  Monitor     Best practice:  Diet: NPO Pain/Anxiety/Delirium protocol (if indicated): holding sedating medication  VAP protocol (if indicated): n/a DVT prophylaxis: SCD's  GI prophylaxis: n/a Glucose control:  Mobility: bedrest  Code Status: full Family Communication: discussed with wife via phone 12/20   Labs   CBC: Recent Labs  Lab 01/05/18 1226 01/06/18 0210 01/07/18 0514  01/08/18 0437 01/09/18 0404 01/09/18 1428  WBC 13.1* 11.4* 9.0 11.8* 17.7*  --   NEUTROABS 10.8*  --   --   --   --   --   HGB 13.1 12.6* 12.0* 9.2* 8.2* 6.3*  HCT 43.0 40.1 37.8* 29.0* 24.9* 19.2*  MCV 101.7* 99.3 99.2 101.0* 98.0  --   PLT 223 230 251 204 261  --     Basic Metabolic Panel: Recent Labs  Lab 01/04/18 0632 01/05/18 1226 01/07/18 0514 01/09/18 0404  NA 136 140 135 135  K 4.5 4.0 4.4 4.8  CL 96* 98 92* 91*  CO2 33* 33* 33* 31  GLUCOSE 154* 146* 170* 207*  BUN 43* 31* 37* 53*  CREATININE 1.12 0.87 1.00 1.36*  CALCIUM 8.3* 8.7* 8.4* 8.0*   GFR: Estimated Creatinine Clearance: 50.7 mL/min (A) (by C-G formula based on SCr of 1.36 mg/dL (H)). Recent Labs  Lab 01/06/18 0210 01/07/18 0514 01/08/18 0437 01/09/18 0404  WBC 11.4* 9.0 11.8* 17.7*    Liver Function Tests: No results for input(s): AST, ALT, ALKPHOS, BILITOT, PROT, ALBUMIN in the last 168 hours. No results for input(s): LIPASE, AMYLASE in the last 168 hours. Recent Labs  Lab 01/08/18  0054  AMMONIA 27    ABG    Component Value Date/Time   PHART 7.426 01/08/2018 0044   PCO2ART 55.2 (H) 01/08/2018 0044   PO2ART 63.0 (L) 01/08/2018 0044   HCO3 33.9 (H) 01/08/2018 0044   TCO2 48.2 08/03/2015 0845   ACIDBASEDEF 8.8 (H) 04/04/2015 0350   O2SAT 91.0 01/08/2018 0044     Coagulation Profile: Recent Labs  Lab 01/08/18 0004  INR 1.08    Cardiac Enzymes: Recent Labs  Lab 01/05/18 1507 01/05/18 2047 01/06/18 0210 01/07/18 0514  TROPONINI 0.16* 0.12* 0.19* 0.08*    HbA1C: Hgb A1c MFr Bld  Date/Time Value Ref Range Status  11/16/2015 11:09 PM 5.3 4.8 - 5.6 % Final    Comment:    (NOTE)         Pre-diabetes: 5.7 - 6.4         Diabetes: >6.4         Glycemic control for adults with diabetes: <7.0     CBG: Recent Labs  Lab 01/08/18 0000 01/09/18 1450  GLUCAP 158* 155*    Review of Systems:   Unable as per HPI obtained from records and staff   Past Medical History  He,   has a past medical history of Anemia, Anxiety, Aortic dissection ([place]), CAD (coronary artery disease), Cardiac tamponade, Chronic bronchitis ([place]), Chronic fatigue, Chronic pain, Constipation, COPD (chronic obstructive pulmonary disease) ([place]), Depression, Dysphagia, oropharyngeal phase, ETOH abuse, GERD (gastroesophageal reflux disease), Hiatal hernia, HTN (hypertension), Hypercholesterolemia, Metabolic encephalopathy, Opioid abuse ([place]), Peptic ulcer disease (08/2010), PNA (pneumonia), and Schatzki's ring.   Surgical History    Past Surgical History:  Procedure Laterality Date  . BACK SURGERY    . CABG X 6  07/31/2007   [facility]  . CARDIAC CATHETERIZATION    . CARDIAC CATHETERIZATION N/A 03/16/2015   Procedure: Coronary/Graft Angiography;  Surgeon: Sherren Mocha, MD;  Location: [place] [facility];  Service: Cardiovascular;  Laterality: N/A;  . CARDIAC CATHETERIZATION N/A 03/16/2015   Procedure: Coronary Stent Intervention;  Surgeon: Sherren Mocha, MD;  Location: [place] [facility];  Service: Cardiovascular;  Laterality: N/A;  . KNEE SURGERY Right    acl  . OLECRANON BURSECTOMY Left 08/27/2012   Procedure: EXCISION LEFT OLECRANON BURSA;  Surgeon: Sanjuana Kava, MD;  Location: [facility];  Service: Orthopedics;  Laterality: Left;  . OLECRANON BURSECTOMY Left 05/25/2013   Procedure:  Excision sinus tract and tissue left elbow ;  Surgeon: Sanjuana Kava, MD;  Location: [facility];  Service: Orthopedics;  Laterality: Left;  . ORIF SCAPULAR FRACTURE Right   . SHOULDER SURGERY Left   . STERNAL WIRE REMOVEAL  12/31/2007   [facility]     Social History   reports that he quit smoking 12 days ago. His smoking use included cigarettes. He has a 20.00 pack-year smoking history. He quit smokeless tobacco use about 2 years ago. He reports that he does not drink alcohol or use drugs.   Family History   His family history includes Asthma in his sister; Emphysema in his paternal grandfather and paternal  grandmother; Heart disease in his father; Stomach cancer in his paternal grandfather.   Allergies Allergies  Allergen Reactions  . Nsaids Shortness Of Breath  . Bee Venom Swelling    Neck swelling, passes out  . Codeine Other (See Comments)    Causes GI Upset     Home Medications  Prior to Admission medications   Medication Sig Start Date End Date Taking? Authorizing Provider  albuterol (PROVENTIL) (  2.5 MG/3ML) 0.083% nebulizer solution Take 3 mLs (2.5 mg total) by nebulization every 6 (six) hours as needed for wheezing or shortness of breath. 01/04/18  Yes Barton Dubois, MD  albuterol (VENTOLIN HFA) 108 (90 Base) MCG/ACT inhaler Inhale 2 puffs into the lungs every 6 (six) hours as needed for wheezing or shortness of breath.   Yes [provider]  ALPRAZolam (XANAX) 0.25 MG tablet Take 1 tablet (0.25 mg total) by mouth 2 (two) times daily as needed for anxiety. 01/04/18 01/04/19 Yes Barton Dubois, MD  aspirin 81 MG chewable tablet Chew 1 tablet (81 mg total) by mouth daily. 04/10/15  Yes [name], MD  atorvastatin (LIPITOR) 20 MG tablet Take 20 mg by mouth daily. 03/17/17  Yes [provider]  benzonatate (TESSALON) 200 MG capsule Take 1 capsule (200 mg total) by mouth 3 (three) times daily as needed for cough. 01/04/18  Yes Barton Dubois, MD  busPIRone (BUSPAR) 7.5 MG tablet Take 1 tablet (7.5 mg total) by mouth 3 (three) times daily. 01/04/18  Yes Barton Dubois, MD  feeding supplement, ENSURE ENLIVE, (ENSURE ENLIVE) LIQD Take 237 mLs by mouth 2 (two) times daily between meals. 01/04/18  Yes Barton Dubois, MD  Fluticasone-Umeclidin-Vilant (TRELEGY ELLIPTA) 100-62.5-25 MCG/INH AEPB Inhale 1 puff into the lungs daily.   Yes [provider]  gabapentin (NEURONTIN) 300 MG capsule Take 300 mg by mouth 3 (three) times daily. 12/20/17  Yes [provider]  levothyroxine (SYNTHROID, LEVOTHROID) 50 MCG tablet Take 50 mcg by mouth daily. 03/18/17  Yes  [provider]  metoprolol tartrate (LOPRESSOR) 25 MG tablet Take 1 tablet (25 mg total) by mouth 2 (two) times daily. 05/27/17  Yes Melrose Nakayama, MD  nicotine (NICODERM CQ - DOSED IN MG/24 HOURS) 21 mg/24hr patch Place 1 patch (21 mg total) onto the skin daily. 01/04/18 01/04/19 Yes Barton Dubois, MD  oxyCODONE-acetaminophen (PERCOCET/ROXICET) 5-325 MG tablet Take 1 tablet by mouth every 8 (eight) hours as needed for up to 7 days for severe pain. 01/04/18 01/11/18 Yes Barton Dubois, MD  OXYGEN Inhale 3.5 L into the lungs daily as needed (for shortness of breath). 3-3.5lpm 24/7    Yes [provider]  pantoprazole (PROTONIX) 40 MG tablet Take 1 tablet (40 mg total) by mouth at bedtime. 04/10/15  Yes [name], MD  predniSONE (DELTASONE) 20 MG tablet Take 3 tablets daily by mouth x1 day; then 2 tablets by mouth daily x2 days; then 1 tablet by mouth daily 3 days; then half tablet by mouth daily x3 days and stop prednisone. 01/04/18  Yes Barton Dubois, MD  traZODone (DESYREL) 100 MG tablet Take 1 tablet (100 mg total) by mouth at bedtime as needed for sleep. 01/04/18  Yes Barton Dubois, MD     Critical care time: [address] NP 01/09/2018  4:01 PM Pager: [phone] or [phone]

## 2018-01-09 NOTE — Significant Event (Signed)
Contact by radiology re: results of scan. There are two areas of retroperitoneal bleeding, a large area of RP bleeding on the right and another large bleed in the rectus sheath. Dissection appears stable, no major arterial source of bleeding noted.   Discussed results with attending physician Dr. Kyung Bacca. No cath at this time, holding heparin. Needs close monitoring of vitals, blood counts, transfusion if blood counts decrease further. Given elevated troponin and prior treatment with NSTEMI, would be reasonable to transfuse given concern for active ischemia and possible further expansion of hematomas.  We will continue to follow with you.  Buford Dresser, MD, PhD [facility]  [address], [place], [place] [address] [phone]

## 2018-01-09 NOTE — Progress Notes (Addendum)
Into assess patient. V/s 81/51, HR 71. Recheck 75/39, HR 72. BG 155. RR and MD paged at this time. After calls made to MD and RR, Patient has change in mental status from previous assessment. Patient unresponsive to questions and non verbal. Will open eyes to chest rub. Pupils reactive. Will continue to monitor

## 2018-01-10 DIAGNOSIS — R0789 Other chest pain: Secondary | ICD-10-CM

## 2018-01-10 DIAGNOSIS — J9600 Acute respiratory failure, unspecified whether with hypoxia or hypercapnia: Secondary | ICD-10-CM

## 2018-01-10 LAB — CBC
HCT: 27.9 % — ABNORMAL LOW (ref 39.0–52.0)
HCT: 26.6 % — ABNORMAL LOW (ref 39.0–52.0)
HCT: 24.9 % — ABNORMAL LOW (ref 39.0–52.0)
HCT: 24.5 % — ABNORMAL LOW (ref 39.0–52.0)
HEMOGLOBIN: 8.2 g/dL — AB (ref 13.0–17.0)
Hemoglobin: 9.3 g/dL — ABNORMAL LOW (ref 13.0–17.0)
Hemoglobin: 8.8 g/dL — ABNORMAL LOW (ref 13.0–17.0)
Hemoglobin: 8.3 g/dL — ABNORMAL LOW (ref 13.0–17.0)
MCH: 31 pg (ref 26.0–34.0)
MCH: 30.4 pg (ref 26.0–34.0)
MCH: 31 pg (ref 26.0–34.0)
MCH: 31.2 pg (ref 26.0–34.0)
MCHC: 33.3 g/dL (ref 30.0–36.0)
MCHC: 33.1 g/dL (ref 30.0–36.0)
MCHC: 33.3 g/dL (ref 30.0–36.0)
MCHC: 33.5 g/dL (ref 30.0–36.0)
MCV: 93 fL (ref 80.0–100.0)
MCV: 92 fL (ref 80.0–100.0)
MCV: 92.9 fL (ref 80.0–100.0)
MCV: 93.2 fL (ref 80.0–100.0)
Platelets: 175 10*3/uL (ref 150–400)
Platelets: 170 10*3/uL (ref 150–400)
Platelets: 170 10*3/uL (ref 150–400)
Platelets: 169 10*3/uL (ref 150–400)
RBC: 3 MIL/uL — ABNORMAL LOW (ref 4.22–5.81)
RBC: 2.89 MIL/uL — ABNORMAL LOW (ref 4.22–5.81)
RBC: 2.68 MIL/uL — AB (ref 4.22–5.81)
RBC: 2.63 MIL/uL — ABNORMAL LOW (ref 4.22–5.81)
RDW: 14.8 % (ref 11.5–15.5)
RDW: 15.1 % (ref 11.5–15.5)
RDW: 15.2 % (ref 11.5–15.5)
RDW: 15.2 % (ref 11.5–15.5)
WBC: 17.4 10*3/uL — ABNORMAL HIGH (ref 4.0–10.5)
WBC: 16.8 10*3/uL — ABNORMAL HIGH (ref 4.0–10.5)
WBC: 20.1 10*3/uL — ABNORMAL HIGH (ref 4.0–10.5)
WBC: 20.4 10*3/uL — ABNORMAL HIGH (ref 4.0–10.5)
nRBC: 0 % (ref 0.0–0.2)
nRBC: 0 % (ref 0.0–0.2)
nRBC: 0.1 % (ref 0.0–0.2)
nRBC: 0 % (ref 0.0–0.2)

## 2018-01-10 LAB — TROPONIN I
Troponin I: 0.18 ng/mL (ref <0.03)
Troponin I: 0.12 ng/mL (ref <0.03)

## 2018-01-10 LAB — BASIC METABOLIC PANEL
Anion gap: 8 (ref 5–15)
BUN: 56 mg/dL — ABNORMAL HIGH (ref 8–23)
CALCIUM: 7.8 mg/dL — AB (ref 8.9–10.3)
CO2: 31 mmol/L (ref 22–32)
CREATININE: 1.26 mg/dL — AB (ref 0.61–1.24)
Chloride: 99 mmol/L (ref 98–111)
GFR calc Af Amer: >60 mL/min (ref >60)
GFR calc non Af Amer: 59 mL/min — ABNORMAL LOW (ref >60)
Glucose, Bld: 117 mg/dL — ABNORMAL HIGH (ref 70–99)
Potassium: 4.6 mmol/L (ref 3.5–5.1)
Sodium: 138 mmol/L (ref 135–145)

## 2018-01-10 LAB — GLUCOSE, CAPILLARY
GLUCOSE-CAPILLARY: 103 mg/dL — AB (ref 70–99)
GLUCOSE-CAPILLARY: 172 mg/dL — AB (ref 70–99)
Glucose-Capillary: 122 mg/dL — ABNORMAL HIGH (ref 70–99)
Glucose-Capillary: 103 mg/dL — ABNORMAL HIGH (ref 70–99)
Glucose-Capillary: 98 mg/dL (ref 70–99)
Glucose-Capillary: 189 mg/dL — ABNORMAL HIGH (ref 70–99)

## 2018-01-10 LAB — PHOSPHORUS: Phosphorus: 3.5 mg/dL (ref 2.5–4.6)

## 2018-01-10 LAB — MAGNESIUM: Magnesium: 2.1 mg/dL (ref 1.7–2.4)

## 2018-01-10 MED ORDER — ACETAMINOPHEN 325 MG PO TABS
650.0000 mg | ORAL_TABLET | ORAL | Status: DC | PRN
  Administered 2018-01-10 – 2018-01-11 (×3): 650 mg via ORAL
  Filled 2018-01-10 (×3): qty 2

## 2018-01-10 MED ORDER — ACETAMINOPHEN 650 MG RE SUPP: 650.0000 mg | RECTAL | Status: DC | PRN

## 2018-01-10 MED ORDER — ALUM & MAG HYDROXIDE-SIMETH 200-200-20 MG/5ML PO SUSP
15.0000 mL | ORAL | Status: DC | PRN
  Administered 2018-01-10 – 2018-01-11 (×2): 15 mL via ORAL
  Filled 2018-01-10 (×2): qty 30

## 2018-01-10 MED ORDER — MORPHINE SULFATE (PF) 2 MG/ML IV SOLN
2.0000 mg | INTRAVENOUS | Status: AC | PRN
  Administered 2018-01-10 (×3): 2 mg via INTRAVENOUS
  Filled 2018-01-10 (×3): qty 1

## 2018-01-10 MED ORDER — KETOROLAC TROMETHAMINE 10 MG PO TABS
10.0000 mg | ORAL_TABLET | Freq: Four times a day (QID) | ORAL | Status: DC | PRN
  Administered 2018-01-10 – 2018-01-11 (×2): 10 mg via ORAL
  Filled 2018-01-10 (×2): qty 1

## 2018-01-10 NOTE — Progress Notes (Signed)
Progress Note  Patient Name: [name] Date of Encounter: 01/10/2018  Primary Cardiologist: Candee Furbish, MD   Subjective   No significant chest pain  Inpatient Medications    Scheduled Meds: . atorvastatin  20 mg Oral q1800  . budesonide (PULMICORT) nebulizer solution  0.5 mg Nebulization BID  . busPIRone  7.5 mg Oral TID  . dextromethorphan-guaiFENesin  1 tablet Oral BID  . feeding supplement (ENSURE ENLIVE)  237 mL Oral BID BM  . gabapentin  300 mg Oral TID  . levothyroxine  50 mcg Oral Q0600  . nicotine  21 mg Transdermal Q24H  . pantoprazole  40 mg Oral QHS  . predniSONE  40 mg Oral Q breakfast  . sodium chloride flush  3 mL Intravenous Q12H   Continuous Infusions: . sodium chloride    . sodium chloride 50 mL/hr at 01/10/18 0900   PRN Meds: sodium chloride, acetaminophen **OR** acetaminophen, ALPRAZolam, hydrALAZINE, ipratropium-albuterol, morphine injection, ondansetron **OR** ondansetron (ZOFRAN) IV, polyethylene glycol, sodium chloride flush   Vital Signs    Vitals:   01/10/18 0723 01/10/18 0741 01/10/18 0800 01/10/18 0900  BP:   105/83 (!) 105/94  Pulse:   73 71  Resp:   16 14  Temp: 98.9 F (37.2 C)     TempSrc: Axillary     SpO2:  99% 97% 98%  Weight:      Height:        Intake/Output Summary (Last 24 hours) at 01/10/2018 1108 Last data filed at 01/10/2018 1026 Gross per 24 hour  Intake 3661.82 ml  Output 325 ml  Net 3336.82 ml   Filed Weights   01/05/18 1950 01/06/18 0617 01/10/18 0430  Weight: 67.1 kg 67.1 kg 70.2 kg    Telemetry    SR - Personally Reviewed  ECG    na  Physical Exam   GEN: No acute distress.   Neck: No JVD Cardiac: RRR, no murmurs, rubs, or gallops.  Respiratory: Clear to auscultation bilaterally. GI: Soft, nontender, non-distended  MS: No edema; No deformity. Neuro:  Nonfocal  Psych: Normal affect   Labs    Chemistry Recent Labs  Lab 01/07/18 0514 01/09/18 0404 01/10/18 0546  NA 135 135 138  K  4.4 4.8 4.6  CL 92* 91* 99  CO2 33* 31 31  GLUCOSE 170* 207* 117*  BUN 37* 53* 56*  CREATININE 1.00 1.36* 1.26*  CALCIUM 8.4* 8.0* 7.8*  GFRNONAA >60 54* 59*  GFRAA >60 >60 >60  ANIONGAP 10 13 8      Hematology Recent Labs  Lab 01/09/18 1602 01/10/18 0115 01/10/18 0546  WBC 16.5* 17.4* 16.8*  RBC 1.80* 3.00* 2.89*  HGB 5.8* 9.3* 8.8*  HCT 17.7* 27.9* 26.6*  MCV 98.3 93.0 92.0  MCH 32.2 31.0 30.4  MCHC 32.8 33.3 33.1  RDW 12.9 14.8 15.1  PLT 200 175 170    Cardiac Enzymes Recent Labs  Lab 01/05/18 1507 01/05/18 2047 01/06/18 0210 01/07/18 0514  TROPONINI 0.16* 0.12* 0.19* 0.08*    Recent Labs  Lab 01/05/18 1231  TROPIPOC 0.17*     BNP Recent Labs  Lab 01/05/18 1226  BNP 422.0*     DDimer No results for input(s): DDIMER in the last 168 hours.   Radiology    Ct Angio Chest/abd/pel For Dissection W And/or W/wo  Result Date: 01/09/2018 CLINICAL DATA:  Acute drop in hemoglobin over the last 2 days with severe back pain and known chronic type B dissection involving the aortic arch,  descending thoracic aorta and abdominal aorta. EXAM: CT ANGIOGRAPHY CHEST, ABDOMEN AND PELVIS TECHNIQUE: Multidetector CT imaging through the chest, abdomen and pelvis was performed using the standard protocol during bolus administration of intravenous contrast. Multiplanar reconstructed images and MIPs were obtained and reviewed to evaluate the vascular anatomy. CONTRAST:  161mL ISOVUE-370 IOPAMIDOL (ISOVUE-370) INJECTION 76% COMPARISON:  Prior CTA of the chest on 01/05/2018. CTA of the chest, abdomen and pelvis on 04/24/2017. FINDINGS: CTA CHEST FINDINGS Cardiovascular: Stable appearance of chronic dissection beginning at the origin of the left subclavian artery and extending down the descending thoracic aorta. Maximum diameter of the distal arch is 4.5 cm which is stable. Maximum diameter of the descending thoracic aorta is approximately 4 cm and is stable. Proximal great vessels show  stable and normal patency. There is stable irregular outpouching of the true lumen into the false lumen at the top of the aortic arch measuring roughly 2.7 cm in greatest diameter. No evidence of acute hemorrhage in the chest. The heart size is stable. No pericardial fluid identified. The ascending thoracic aorta is normal in caliber and shows no evidence of dissection. Central pulmonary arteries are normal in caliber and well opacified demonstrating normal patency. Mediastinum/Nodes: No enlarged mediastinal, hilar, or axillary lymph nodes. Thyroid gland, trachea, and esophagus demonstrate no significant findings. Lungs/Pleura: Stable emphysematous lung disease. There is no evidence of pulmonary edema, consolidation, pneumothorax, nodule or pleural fluid. Musculoskeletal: Beginning in the lower right chest wall, there is evidence of hemorrhage anteriorly continuing into the upper abdominal wall. This is further described in the CTA abdomen report below. Review of the MIP images confirms the above findings. CTA ABDOMEN AND PELVIS FINDINGS VASCULAR Aorta: Stable evidence of dissection extending down the abdominal aorta and into the proximal right common iliac artery. Stable narrowing of the proximal right common iliac artery. Stable aneurysmal dilatation of the abdominal aorta measuring approximately 4.6 x 4.7 cm. No evidence of aneurysm rupture. Celiac: . small caliber celiac trunk emanating off of the true lumen and otherwise demonstrating normal patency. SMA: SMA originates off of the true lumen and demonstrates normal patency. Renals: Bilateral renal arteries emanate off of the true lumen and demonstrate normal patency. 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 Inflow: Bilateral iliac artery patency stable with stable stenosis of the proximal right common iliac artery of approximately 60-70%. Stable small caliber bilateral external iliac arteries. Review of the MIP images confirms the above findings.  NON-VASCULAR Hepatobiliary: No focal liver abnormality is seen. No gallstones, gallbladder wall thickening, or biliary dilatation. Pancreas: Unremarkable. No pancreatic ductal dilatation or surrounding inflammatory changes. Spleen: Normal in size without focal abnormality. Adrenals/Urinary Tract: Adrenal glands are unremarkable. Kidneys are normal, without renal calculi, focal lesion, or hydronephrosis. Bladder is unremarkable. Stomach/Bowel: No bowel obstruction, ileus or free air. No phlegm a tori process or mass identified. Lymphatic: No enlarged lymph nodes identified. Reproductive: Prostate is unremarkable. Other: There are 2 separate areas of large spontaneous hemorrhage. Large hematoma of the lower anterior right chest wall extends into the upper right rectus sheath. Largest dimensions of the area of hemorrhage are roughly 14.5 x 6.0 x 12.6 cm. There also is spontaneous hemorrhage in the right retroperitoneum involving the psoas muscle and extending laterally into the retroperitoneal space. Intramuscular psoas hemorrhage extends into the pelvis. Largest dimensions of the right retroperitoneal bleed are roughly 8.2 x 6.4 x 16 cm. Musculoskeletal: No acute or significant osseous findings. Review of the MIP images confirms the above findings. IMPRESSION: 1. No change  in appearance of the chronic type B aortic dissection involving the distal arch, descending thoracic aorta and abdominal aorta. No evidence to suggest rupture or hemorrhage emanating from the aorta. 2. New spontaneous large hemorrhages involving the lower right anterior chest wall extending into the upper right rectus abdominal musculature and the right retroperitoneum involving the right iliopsoas muscle extending into the pelvis. 3. These results were called by telephone at the time of interpretation on 01/09/2018 at 11:15 am to Dr. Buford Dresser , who verbally acknowledged these results. Electronically Signed   By: Aletta Edouard M.D.    On: 01/09/2018 11:30    Cardiac Studies     Patient Profile     67 y.o.malew/ PMHof CAD (s/p CABG in 2009 with cath in 02/2008 showing occluded SVG-PDA and SVG-OM, repeat cath in 02/2015 showing patency of LIMA-LAD, SVG-D1, and free RIMA-Ramus and PCI/DES of native RCA), Type B aortic dissection, chronic hypoxic respiratory failure (on 3-4L [place] at baseline), HLD, and orthostatic hypotensionwho presented to [facility] ED on 01/05/2018 for worsening dyspnea. Cardiology consulted for evaluation of chest pain and elevated troponin values.  Assessment & Plan    1. Chest pain/ Elevated troponins - He has history of CAD with prior CABG, last cath 2017 where he received a stent to his native RCA. At that time LIMA-LAD patent, SVG-diag patent, RIMA-raums patent, occluded SVG-PDA and occluded SVG-OM.  - presented with worsening dyspnea after having recently been discharged for a COPD exacerbation.Cyclic troponin valueshave beenflat at0.16, 0.12, and 0.19 peak this admissionbut EKG tracings do show new TWI along the inferolateral leads - Echo this admit LVEF 55-60%. - convoluted chest pain symptoms in setting of COPD exacerbation   - cath plans cancelled after development of significant chest wall/retroperitoneal bleed   2. Retroperitioneal bleed - patient yesterday with severe lower back pain, drop in Hgb from 12 to 8 without clear source of external bleeding - CTA chest/abd/pelvis yesterday showed stable type B aortic dissection. Did show new spontaneous large bleeding lower right anterior chest wall extending into upper right rectus and the right retroperitoneum - hep gtt stopped  3. Anemia - sudden drop in Hgb from 12 to 8 during this admission in setting of retroperitoneal bleed, further decrease to 5.8.  - anticoag stopped, off ASA - transfused 2 units, monitoring counts off anticoag - Hgb 9.3 to 8.8 this AM, continue to monitor       For questions or updates, please  contact [facility] Please consult [email or web address] for contact info under        Signed, Carlyle Dolly, MD  01/10/2018, 11:08 AM

## 2018-01-10 NOTE — Progress Notes (Signed)
CRITICAL VALUE ALERT  Critical Value:  Trop 0.18  Date & Time Notied:  01/10/18 @ 0131  Provider Notified: Dr. Vaughan Browner  Orders Received/Actions taken: Trend trop and remain in ICU overnight

## 2018-01-10 NOTE — Progress Notes (Addendum)
NAME:  [name], MRN:  [id number], DOB:  August 03, 1950, LOS: 4 ADMISSION DATE:  01/05/2018, CONSULTATION DATE:  12/20 REFERRING MD:  Kyung Bacca CHIEF COMPLAINT:  Hypotension, retroperitoneal bleed    Brief History   67yo male with hx chronic hypoxic respiratory failure on 3-4L home O2 r/t COPD, CAD s/p CABG 2009 with known severe 3V disease on cath 2017, HTN, chronic type B aortic dissection with recent admission for AECOPD, re-admitted 12/16 to [facility] with chest pain and SOB.  W/u neg for PE but did reveal elevated troponin (0.16, 0.12, 0.19) and some EKG changes and was therefore seen by cardiology, started on heparin gtt and ASA and tx to [facility] for tentative cardiac cath.  On 12/20 developed severe back pain, hypotension with SBP 60's and AMS and CT abd revealed large retroperitoneal bleed. PCCM consulted for ICU tx.   Past Medical History   has a past medical history of Anemia, Anxiety, Aortic dissection ([place]), CAD (coronary artery disease), Cardiac tamponade, Chronic bronchitis (HCC), Chronic fatigue, Chronic pain, Constipation, COPD (chronic obstructive pulmonary disease) (HCC), Depression, Dysphagia, oropharyngeal phase, ETOH abuse, GERD (gastroesophageal reflux disease), Hiatal hernia, HTN (hypertension), Hypercholesterolemia, Metabolic encephalopathy, Opioid abuse ([place]), Peptic ulcer disease (08/2010), PNA (pneumonia), and Schatzki's ring.  Significant Hospital Events   12/26- Admit to [facility] 12/16 with NSTEMI 12/20- To ICU with RP hemorrhage, hypotension  Consults:  Cardiology 12/17>>> PCCM  12/20>>  Procedures:    Significant Diagnostic Tests:  CTA chest 12/16>>> 1. 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 2. Interval thrombosis of the false lumen of the  dissection with the exception of a small amount of persistent opacified blood in the aortic arch superiorly on the left. 3. Mild changes of COPD. 4. Calcific coronary artery and aortic atherosclerosis and post CABG Changes  CT head 12/19>>> neg acute   CTA chest/abd/pelvis 12/20- No change in chronic type B aortic dissection, new large hemorrhage involving right anterior chest wall, upper abdominal wall, right retroperitoneum extending into the pelvis.  Micro Data:    Antimicrobials:     Interim history/subjective:  Much more awake today, but still confused Hemodynamically stable.  Objective   Blood pressure 105/83, pulse 73, temperature 98.9 F (37.2 C), temperature source Axillary, resp. rate 16, height 5\' 10"  (1.778 m), weight 70.2 kg, SpO2 97 %.        Intake/Output Summary (Last 24 hours) at 01/10/2018 0909 Last data filed at 01/10/2018 0900 Gross per 24 hour  Intake 3658.82 ml  Output 325 ml  Net 3333.82 ml   Filed Weights   01/05/18 1950 01/06/18 0617 01/10/18 0430  Weight: 67.1 kg 67.1 kg 70.2 kg   Examination: Gen:      Chronically ill-appearing HEENT:  EOMI, sclera anicteric Neck:     No masses; no thyromegaly Lungs:    Clear to auscultation bilaterally; normal respiratory effort CV:         Regular rate and rhythm; no murmurs Abd:      + bowel sounds; soft, non-tender; no palpable masses, no distension Ext:    No edema; adequate peripheral perfusion Skin:      Warm and dry; no rash Neuro: Awake, mild confusion.  Moves all extremities. No focal deficit  Resolved Hospital Problem list     Assessment &  Plan:   Hypotension/ hemorrhagic shock - in setting retroperitoneal/chest wall bleed.  Blood loss anemia  Large chest wall/ retroperitoneal bleed  PLAN -  S/p 2 units Continue monitoring CBC. Off heparin drip.  AMS - improved with volume  PLAN -  Supportive care  Monitor neuro status.  CAD  NSTEMI  PLAN -  Cardiology following  Trend  troponin   Chronic hypoxic respiratory failure on 3-4L home O2  COPD PLAN -  Continue po prednisone  Supplemental O2 as needed  BD's   Chronic low back pain  PLAN -  Off sedating medications for now  Try tylenol PRN  Known chronic type B aortic dissection - stable on CT  PLAN -  Monitor   Best practice:  Diet: NPO Pain/Anxiety/Delirium protocol (if indicated): holding sedating medication  VAP protocol (if indicated): n/a DVT prophylaxis: SCD's  GI prophylaxis: n/a Glucose control:  Mobility: bedrest  Code Status: full Family Communication: discussed with wife via phone 12/20. No family at bedside 12/21  [facility] in ICU for 24 more hrs for monitoring   Labs   CBC: Recent Labs  Lab 01/05/18 1226  01/08/18 0437 01/09/18 0404 01/09/18 1428 01/09/18 1602 01/10/18 0115 01/10/18 0546  WBC 13.1*   < > 11.8* 17.7*  --  16.5* 17.4* 16.8*  NEUTROABS 10.8*  --   --   --   --   --   --   --   HGB 13.1   < > 9.2* 8.2* 6.3* 5.8* 9.3* 8.8*  HCT 43.0   < > 29.0* 24.9* 19.2* 17.7* 27.9* 26.6*  MCV 101.7*   < > 101.0* 98.0  --  98.3 93.0 92.0  PLT 223   < > 204 261  --  200 175 170   < > = values in this interval not displayed.    Basic Metabolic Panel: Recent Labs  Lab 01/04/18 0632 01/05/18 1226 01/07/18 0514 01/09/18 0404 01/10/18 0546  NA 136 140 135 135 138  K 4.5 4.0 4.4 4.8 4.6  CL 96* 98 92* 91* 99  CO2 33* 33* 33* 31 31  GLUCOSE 154* 146* 170* 207* 117*  BUN 43* 31* 37* 53* 56*  CREATININE 1.12 0.87 1.00 1.36* 1.26*  CALCIUM 8.3* 8.7* 8.4* 8.0* 7.8*  MG  --   --   --   --  2.1  PHOS  --   --   --   --  3.5   GFR: Estimated Creatinine Clearance: 57.3 mL/min (A) (by C-G formula based on SCr of 1.26 mg/dL (H)). Recent Labs  Lab 01/09/18 0404 01/09/18 1602 01/10/18 0115 01/10/18 0546  WBC 17.7* 16.5* 17.4* 16.8*    Liver Function Tests: No results for input(s): AST, ALT, ALKPHOS, BILITOT, PROT, ALBUMIN in the last 168 hours. No results for input(s):  LIPASE, AMYLASE in the last 168 hours. Recent Labs  Lab 01/08/18 0054  AMMONIA 27    ABG    Component Value Date/Time   PHART 7.426 01/08/2018 0044   PCO2ART 55.2 (H) 01/08/2018 0044   PO2ART 63.0 (L) 01/08/2018 0044   HCO3 33.9 (H) 01/08/2018 0044   TCO2 48.2 08/03/2015 0845   ACIDBASEDEF 8.8 (H) 04/04/2015 0350   O2SAT 91.0 01/08/2018 0044     Coagulation Profile: Recent Labs  Lab 01/08/18 0004  INR 1.08    Cardiac Enzymes: Recent Labs  Lab 01/05/18 1507 01/05/18 2047 01/06/18 0210 01/07/18 0514  TROPONINI 0.16* 0.12* 0.19* 0.08*    HbA1C: Hgb A1c  MFr Bld  Date/Time Value Ref Range Status  11/16/2015 11:09 PM 5.3 4.8 - 5.6 % Final    Comment:    (NOTE)         Pre-diabetes: 5.7 - 6.4         Diabetes: >6.4         Glycemic control for adults with diabetes: <7.0     CBG: Recent Labs  Lab 01/09/18 1450 01/09/18 1935 01/09/18 2356 01/10/18 0316 01/10/18 0722  GLUCAP 155* 131* 103* 122* 103*   The patient is critically ill with multiple organ system failure and requires high complexity decision making for assessment and support, frequent evaluation and titration of therapies, advanced monitoring, review of radiographic studies and interpretation of complex data.   Critical Care Time devoted to patient care services, exclusive of separately billable procedures, described in this note is 35 minutes.   Marshell Garfinkel MD [facility] Pager [phone] If no answer or after 3pm call: [phone] 01/10/2018, 4:22 PM

## 2018-01-10 NOTE — Progress Notes (Signed)
Pt. complaining of chest pain 10/10. He states the pain is sharp and burning. Morphine 2mg  given, pt. On 2L [place] and EKG placed in chart. Dr. Vaughan Browner notified and ordered trop to be drawn

## 2018-01-11 ENCOUNTER — Inpatient Hospital Stay (HOSPITAL_COMMUNITY): Payer: Medicare PPO

## 2018-01-11 LAB — CBC
HCT: 22.9 % — ABNORMAL LOW (ref 39.0–52.0)
Hemoglobin: 7.6 g/dL — ABNORMAL LOW (ref 13.0–17.0)
MCH: 31.4 pg (ref 26.0–34.0)
MCHC: 33.2 g/dL (ref 30.0–36.0)
MCV: 94.6 fL (ref 80.0–100.0)
Platelets: 160 10*3/uL (ref 150–400)
RBC: 2.42 MIL/uL — ABNORMAL LOW (ref 4.22–5.81)
RDW: 14.8 % (ref 11.5–15.5)
WBC: 18.8 10*3/uL — AB (ref 4.0–10.5)
nRBC: 0 % (ref 0.0–0.2)

## 2018-01-11 LAB — CBC WITH DIFFERENTIAL/PLATELET
ABS IMMATURE GRANULOCYTES: 0.34 10*3/uL — AB (ref 0.00–0.07)
Basophils Absolute: 0 10*3/uL (ref 0.0–0.1)
Basophils Relative: 0 %
Eosinophils Absolute: 0 10*3/uL (ref 0.0–0.5)
Eosinophils Relative: 0 %
HCT: 24.3 % — ABNORMAL LOW (ref 39.0–52.0)
Hemoglobin: 8.2 g/dL — ABNORMAL LOW (ref 13.0–17.0)
Immature Granulocytes: 2 %
Lymphocytes Relative: 4 %
Lymphs Abs: 0.7 10*3/uL (ref 0.7–4.0)
MCH: 31.3 pg (ref 26.0–34.0)
MCHC: 33.7 g/dL (ref 30.0–36.0)
MCV: 92.7 fL (ref 80.0–100.0)
MONO ABS: 1.1 10*3/uL — AB (ref 0.1–1.0)
Monocytes Relative: 6 %
Neutro Abs: 16.9 10*3/uL — ABNORMAL HIGH (ref 1.7–7.7)
Neutrophils Relative %: 88 %
Platelets: 176 10*3/uL (ref 150–400)
RBC: 2.62 MIL/uL — ABNORMAL LOW (ref 4.22–5.81)
RDW: 14.6 % (ref 11.5–15.5)
WBC: 19 10*3/uL — ABNORMAL HIGH (ref 4.0–10.5)
nRBC: 0.1 % (ref 0.0–0.2)

## 2018-01-11 LAB — PREPARE RBC (CROSSMATCH)

## 2018-01-11 LAB — BASIC METABOLIC PANEL
Anion gap: 8 (ref 5–15)
BUN: 57 mg/dL — ABNORMAL HIGH (ref 8–23)
CO2: 30 mmol/L (ref 22–32)
Calcium: 7.8 mg/dL — ABNORMAL LOW (ref 8.9–10.3)
Chloride: 100 mmol/L (ref 98–111)
Creatinine, Ser: 1.13 mg/dL (ref 0.61–1.24)
GFR calc non Af Amer: >60 mL/min (ref >60)
Glucose, Bld: 134 mg/dL — ABNORMAL HIGH (ref 70–99)
Potassium: 4.6 mmol/L (ref 3.5–5.1)
Sodium: 138 mmol/L (ref 135–145)

## 2018-01-11 LAB — GLUCOSE, CAPILLARY
Glucose-Capillary: 126 mg/dL — ABNORMAL HIGH (ref 70–99)
Glucose-Capillary: 99 mg/dL (ref 70–99)
Glucose-Capillary: 147 mg/dL — ABNORMAL HIGH (ref 70–99)
Glucose-Capillary: 175 mg/dL — ABNORMAL HIGH (ref 70–99)
Glucose-Capillary: 133 mg/dL — ABNORMAL HIGH (ref 70–99)

## 2018-01-11 LAB — TROPONIN I
TROPONIN I: 0.22 ng/mL — AB (ref <0.03)
Troponin I: 0.22 ng/mL (ref <0.03)

## 2018-01-11 LAB — MAGNESIUM: Magnesium: 2.1 mg/dL (ref 1.7–2.4)

## 2018-01-11 LAB — PHOSPHORUS: Phosphorus: 3.1 mg/dL (ref 2.5–4.6)

## 2018-01-11 MED ORDER — PREDNISONE 20 MG PO TABS
30.0000 mg | ORAL_TABLET | Freq: Every day | ORAL | Status: DC
  Administered 2018-01-12 – 2018-01-14 (×3): 30 mg via ORAL
  Filled 2018-01-11 (×3): qty 1

## 2018-01-11 MED ORDER — SODIUM CHLORIDE 0.9% IV SOLUTION
Freq: Once | INTRAVENOUS | Status: AC
  Administered 2018-01-11: 12:00:00 via INTRAVENOUS

## 2018-01-11 MED ORDER — OXYCODONE-ACETAMINOPHEN 5-325 MG PO TABS
1.0000 | ORAL_TABLET | Freq: Four times a day (QID) | ORAL | Status: DC | PRN
  Administered 2018-01-11 – 2018-01-14 (×10): 1 via ORAL
  Filled 2018-01-11 (×10): qty 1

## 2018-01-11 MED ORDER — BISACODYL 5 MG PO TBEC: 5.0000 mg | DELAYED_RELEASE_TABLET | Freq: Every day | ORAL | Status: DC | PRN

## 2018-01-11 MED ORDER — NICOTINE 14 MG/24HR TD PT24: 14.0000 mg | MEDICATED_PATCH | TRANSDERMAL | Status: DC

## 2018-01-11 MED ORDER — BENZONATATE 100 MG PO CAPS
200.0000 mg | ORAL_CAPSULE | Freq: Two times a day (BID) | ORAL | Status: DC
  Administered 2018-01-11 – 2018-01-14 (×7): 200 mg via ORAL
  Filled 2018-01-11 (×7): qty 2

## 2018-01-11 NOTE — Progress Notes (Signed)
NAME:  [name], MRN:  [id number], DOB:  1950/10/20, LOS: 5 ADMISSION DATE:  01/05/2018, CONSULTATION DATE:  12/20 REFERRING MD:  Kyung Bacca CHIEF COMPLAINT:  Hypotension, retroperitoneal bleed    Brief History   67yo male with hx chronic hypoxic respiratory failure on 3-4L home O2 r/t COPD, CAD s/p CABG 2009 with known severe 3V disease on cath 2017, HTN, chronic type B aortic dissection with recent admission for AECOPD, re-admitted 12/16 to [facility] with chest pain and SOB.  W/u neg for PE but did reveal elevated troponin (0.16, 0.12, 0.19) and some EKG changes and was therefore seen by cardiology, started on heparin gtt and ASA and tx to [facility] for tentative cardiac cath.  On 12/20 developed severe back pain, hypotension with SBP 60's and AMS and CT abd revealed large retroperitoneal bleed. PCCM consulted for ICU tx.   Past Medical History   has a past medical history of Anemia, Anxiety, Aortic dissection ([place]), CAD (coronary artery disease), Cardiac tamponade, Chronic bronchitis (HCC), Chronic fatigue, Chronic pain, Constipation, COPD (chronic obstructive pulmonary disease) (HCC), Depression, Dysphagia, oropharyngeal phase, ETOH abuse, GERD (gastroesophageal reflux disease), Hiatal hernia, HTN (hypertension), Hypercholesterolemia, Metabolic encephalopathy, Opioid abuse ([place]), Peptic ulcer disease (08/2010), PNA (pneumonia), and Schatzki's ring.  Significant Hospital Events   12/26- Admit to [facility] 12/16 with NSTEMI 12/20- To ICU with RP hemorrhage, hypotension  Consults:  Cardiology 12/17>>> PCCM  12/20>>  Procedures:    Significant Diagnostic Tests:  CTA chest 12/16>>> 1. 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 2. Interval thrombosis of the false lumen of the  dissection with the exception of a small amount of persistent opacified blood in the aortic arch superiorly on the left. 3. Mild changes of COPD. 4. Calcific coronary artery and aortic atherosclerosis and post CABG Changes  CT head 12/19>>> neg acute   CTA chest/abd/pelvis 12/20- No change in chronic type B aortic dissection, new large hemorrhage involving right anterior chest wall, upper abdominal wall, right retroperitoneum extending into the pelvis.  Micro Data:    Antimicrobials:     Interim history/subjective:  Continues to improve with mental status Hemoglobin is dropped to 7.6 therefore remain in ICU for at least 24 hours.  Objective   Blood pressure (!) 112/58, pulse 69, temperature 98.2 F (36.8 C), temperature source Oral, resp. rate 17, height 5\' 10"  (1.778 m), weight 70.2 kg, SpO2 97 %.        Intake/Output Summary (Last 24 hours) at 01/11/2018 1001 Last data filed at 01/11/2018 0900 Gross per 24 hour  Intake 1048.85 ml  Output 725 ml  Net 323.85 ml   Filed Weights   01/05/18 1950 01/06/18 0617 01/10/18 0430  Weight: 67.1 kg 67.1 kg 70.2 kg   Examination: General: Thin frail male no acute distress complains of back and abdominal pain HEENT: No JVD no lymphadenopathy is appreciated Neuro: More awake and interactive moves all extremities follows commands CV: Heart sounds are regular PULM: Rhonchi bilaterally, congested cough noted GI: Tense, tender positive bowel sounds Extremities: warm/dry negative edema  Skin: no rashes or lesions   Resolved Hospital Problem list     Assessment & Plan:   Hypotension/ hemorrhagic shock - in setting retroperitoneal/chest wall bleed.  Blood loss anemia  Large chest wall/ retroperitoneal bleed  PLAN -  S/p 2 units Hemoglobin 7.6 on 01/11/2018 which is down to half a gram and we noticed a trend of decreasing on a daily basis Keep in ICU 24 more hours Recheck hemoglobin at 1800 hrs. Tonight May need further  radiological work-up if continues to bleed Bowel regimen instituted no BM since 01/07/2018 to check for possible GI bleed.   AMS - improved with volume  PLAN -  Monitor neurological status  CAD  NSTEMI  PLAN -  Cardiology is following  Chronic hypoxic respiratory failure on 3-4L home O2  COPD PLAN -  Prednisone decreased to 30 mg daily on 01/11/2017 O2 as needed Broncho-dilators  Chronic low back pain  PLAN -  Off sedating medication Tylenol as needed  Known chronic type B aortic dissection - stable on CT  PLAN -  Continue to monitor  Best practice:  Diet: Regular diet Pain/Anxiety/Delirium protocol (if indicated): holding sedating medication  VAP protocol (if indicated): n/a DVT prophylaxis: SCD's  GI prophylaxis: n/a Glucose control:  Mobility: bedrest  Code Status: full Family Communication: discussed with wife via phone 12/20.  01/11/2018 no family ICU  He will dropped one half a gram now 7.6 on 01/11/2018.  Keep in ICU overnight and monitor hemoglobin.   Labs   CBC: Recent Labs  Lab 01/05/18 1226  01/10/18 0115 01/10/18 0546 01/10/18 1405 01/10/18 1802 01/11/18 0218  WBC 13.1*   < > 17.4* 16.8* 20.1* 20.4* 18.8*  NEUTROABS 10.8*  --   --   --   --   --   --   HGB 13.1   < > 9.3* 8.8* 8.3* 8.2* 7.6*  HCT 43.0   < > 27.9* 26.6* 24.9* 24.5* 22.9*  MCV 101.7*   < > 93.0 92.0 92.9 93.2 94.6  PLT 223   < > 175 170 170 169 160   < > = values in this interval not displayed.    Basic Metabolic Panel: Recent Labs  Lab 01/05/18 1226 01/07/18 0514 01/09/18 0404 01/10/18 0546 01/11/18 0218  NA 140 135 135 138 138  K 4.0 4.4 4.8 4.6 4.6  CL 98 92* 91* 99 100  CO2 33* 33* 31 31 30   GLUCOSE 146* 170* 207* 117* 134*  BUN 31* 37* 53* 56* 57*  CREATININE 0.87 1.00 1.36* 1.26* 1.13  CALCIUM 8.7* 8.4* 8.0* 7.8* 7.8*  MG  --   --   --  2.1 2.1  PHOS  --   --   --  3.5 3.1   GFR: Estimated Creatinine Clearance: 63.8 mL/min (by C-G formula based on SCr  of 1.13 mg/dL). Recent Labs  Lab 01/10/18 0546 01/10/18 1405 01/10/18 1802 01/11/18 0218  WBC 16.8* 20.1* 20.4* 18.8*    Liver Function Tests: No results for input(s): AST, ALT, ALKPHOS, BILITOT, PROT, ALBUMIN in the last 168 hours. No results for input(s): LIPASE, AMYLASE in the last 168 hours. Recent Labs  Lab 01/08/18 0054  AMMONIA 27    ABG    Component Value Date/Time   PHART 7.426 01/08/2018 0044   PCO2ART 55.2 (H) 01/08/2018 0044   PO2ART 63.0 (L) 01/08/2018 0044   HCO3 33.9 (H) 01/08/2018 0044   TCO2 48.2 08/03/2015 0845   ACIDBASEDEF 8.8 (H) 04/04/2015 0350   O2SAT 91.0 01/08/2018 0044     Coagulation Profile: Recent Labs  Lab 01/08/18 0004  INR 1.08    Cardiac Enzymes: Recent Labs  Lab 01/07/18 0514 01/10/18 1405 01/10/18 2013 01/11/18 0218 01/11/18 0822  TROPONINI 0.08*  0.18* 0.12* 0.22* 0.22*    HbA1C: Hgb A1c MFr Bld  Date/Time Value Ref Range Status  11/16/2015 11:09 PM 5.3 4.8 - 5.6 % Final    Comment:    (NOTE)         Pre-diabetes: 5.7 - 6.4         Diabetes: >6.4         Glycemic control for adults with diabetes: <7.0     CBG: Recent Labs  Lab 01/10/18 1109 01/10/18 1515 01/10/18 2325 01/11/18 0333 01/11/18 [place] 189* 172* 126* 99      Steve Anderson [place] ACNP Maryanna Shape PCCM Pager [phone] till 1 pm If no answer page [phone] 01/11/2018, 10:01 AM

## 2018-01-11 NOTE — Progress Notes (Signed)
Progress Note  Patient Name: [name] Date of Encounter: 01/11/2018  Primary Cardiologist: Candee Furbish, MD   Subjective   Chest pain with coughing.   Inpatient Medications    Scheduled Meds: . atorvastatin  20 mg Oral q1800  . budesonide (PULMICORT) nebulizer solution  0.5 mg Nebulization BID  . busPIRone  7.5 mg Oral TID  . dextromethorphan-guaiFENesin  1 tablet Oral BID  . feeding supplement (ENSURE ENLIVE)  237 mL Oral BID BM  . gabapentin  300 mg Oral TID  . levothyroxine  50 mcg Oral Q0600  . [START ON 01/12/2018] nicotine  14 mg Transdermal Q24H  . pantoprazole  40 mg Oral QHS  . [START ON 01/12/2018] predniSONE  30 mg Oral Q breakfast  . sodium chloride flush  3 mL Intravenous Q12H   Continuous Infusions: . sodium chloride    . sodium chloride 50 mL/hr at 01/11/18 1000   PRN Meds: sodium chloride, acetaminophen **OR** acetaminophen, ALPRAZolam, alum & mag hydroxide-simeth, bisacodyl, hydrALAZINE, ipratropium-albuterol, ketorolac, ondansetron **OR** ondansetron (ZOFRAN) IV, polyethylene glycol, sodium chloride flush   Vital Signs    Vitals:   01/11/18 0745 01/11/18 0800 01/11/18 0900 01/11/18 1000  BP:  135/78 (!) 112/58 113/64  Pulse:  82 69 86  Resp:  16 17 (!) 22  Temp:      TempSrc:      SpO2: 95% 98% 97% 98%  Weight:      Height:        Intake/Output Summary (Last 24 hours) at 01/11/2018 1048 Last data filed at 01/11/2018 1000 Gross per 24 hour  Intake 1095.92 ml  Output 725 ml  Net 370.92 ml   Filed Weights   01/05/18 1950 01/06/18 0617 01/10/18 0430  Weight: 67.1 kg 67.1 kg 70.2 kg    Telemetry    SR- Personally Reviewed  ECG    na  Physical Exam   GEN: No acute distress.   Neck: No JVD Cardiac: RRR, no murmurs, rubs, or gallops.  Respiratory: Clear to auscultation bilaterally. GI: Soft, nontender, non-distended  MS: No edema; No deformity. Neuro:  Nonfocal  Psych: Normal affect   Labs    Chemistry Recent Labs  Lab  01/09/18 0404 01/10/18 0546 01/11/18 0218  NA 135 138 138  K 4.8 4.6 4.6  CL 91* 99 100  CO2 31 31 30   GLUCOSE 207* 117* 134*  BUN 53* 56* 57*  CREATININE 1.36* 1.26* 1.13  CALCIUM 8.0* 7.8* 7.8*  GFRNONAA 54* 59* >60  GFRAA >60 >60 >60  ANIONGAP 13 8 8      Hematology Recent Labs  Lab 01/10/18 1405 01/10/18 1802 01/11/18 0218  WBC 20.1* 20.4* 18.8*  RBC 2.68* 2.63* 2.42*  HGB 8.3* 8.2* 7.6*  HCT 24.9* 24.5* 22.9*  MCV 92.9 93.2 94.6  MCH 31.0 31.2 31.4  MCHC 33.3 33.5 33.2  RDW 15.2 15.2 14.8  PLT 170 169 160    Cardiac Enzymes Recent Labs  Lab 01/10/18 1405 01/10/18 2013 01/11/18 0218 01/11/18 0822  TROPONINI 0.18* 0.12* 0.22* 0.22*    Recent Labs  Lab 01/05/18 1231  TROPIPOC 0.17*     BNP Recent Labs  Lab 01/05/18 1226  BNP 422.0*     DDimer No results for input(s): DDIMER in the last 168 hours.   Radiology    Dg Chest Port 1 View  Result Date: 01/11/2018 CLINICAL DATA:  Acute respiratory failure EXAM: PORTABLE CHEST 1 VIEW COMPARISON:  01/05/2018 FINDINGS: Postop CABG. Negative for heart failure  or edema. Improvement in by small bilateral pleural effusions. Negative for pneumonia. IMPRESSION: Lungs remain clear without edema or pneumonia. Improvement in small bilateral effusions. Electronically Signed   By: Franchot Gallo M.D.   On: 01/11/2018 07:04   Ct Angio Chest/abd/pel For Dissection W And/or W/wo  Result Date: 01/09/2018 CLINICAL DATA:  Acute drop in hemoglobin over the last 2 days with severe back pain and known chronic type B dissection involving the aortic arch, descending thoracic aorta and abdominal aorta. EXAM: CT ANGIOGRAPHY CHEST, ABDOMEN AND PELVIS TECHNIQUE: Multidetector CT imaging through the chest, abdomen and pelvis was performed using the standard protocol during bolus administration of intravenous contrast. Multiplanar reconstructed images and MIPs were obtained and reviewed to evaluate the vascular anatomy. CONTRAST:  134mL  ISOVUE-370 IOPAMIDOL (ISOVUE-370) INJECTION 76% COMPARISON:  Prior CTA of the chest on 01/05/2018. CTA of the chest, abdomen and pelvis on 04/24/2017. FINDINGS: CTA CHEST FINDINGS Cardiovascular: Stable appearance of chronic dissection beginning at the origin of the left subclavian artery and extending down the descending thoracic aorta. Maximum diameter of the distal arch is 4.5 cm which is stable. Maximum diameter of the descending thoracic aorta is approximately 4 cm and is stable. Proximal great vessels show stable and normal patency. There is stable irregular outpouching of the true lumen into the false lumen at the top of the aortic arch measuring roughly 2.7 cm in greatest diameter. No evidence of acute hemorrhage in the chest. The heart size is stable. No pericardial fluid identified. The ascending thoracic aorta is normal in caliber and shows no evidence of dissection. Central pulmonary arteries are normal in caliber and well opacified demonstrating normal patency. Mediastinum/Nodes: No enlarged mediastinal, hilar, or axillary lymph nodes. Thyroid gland, trachea, and esophagus demonstrate no significant findings. Lungs/Pleura: Stable emphysematous lung disease. There is no evidence of pulmonary edema, consolidation, pneumothorax, nodule or pleural fluid. Musculoskeletal: Beginning in the lower right chest wall, there is evidence of hemorrhage anteriorly continuing into the upper abdominal wall. This is further described in the CTA abdomen report below. Review of the MIP images confirms the above findings. CTA ABDOMEN AND PELVIS FINDINGS VASCULAR Aorta: Stable evidence of dissection extending down the abdominal aorta and into the proximal right common iliac artery. Stable narrowing of the proximal right common iliac artery. Stable aneurysmal dilatation of the abdominal aorta measuring approximately 4.6 x 4.7 cm. No evidence of aneurysm rupture. Celiac: . small caliber celiac trunk emanating off of the true  lumen and otherwise demonstrating normal patency. SMA: SMA originates off of the true lumen and demonstrates normal patency. Renals: Bilateral renal arteries emanate off of the true lumen and demonstrate normal patency. 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 Inflow: Bilateral iliac artery patency stable with stable stenosis of the proximal right common iliac artery of approximately 60-70%. Stable small caliber bilateral external iliac arteries. Review of the MIP images confirms the above findings. NON-VASCULAR Hepatobiliary: No focal liver abnormality is seen. No gallstones, gallbladder wall thickening, or biliary dilatation. Pancreas: Unremarkable. No pancreatic ductal dilatation or surrounding inflammatory changes. Spleen: Normal in size without focal abnormality. Adrenals/Urinary Tract: Adrenal glands are unremarkable. Kidneys are normal, without renal calculi, focal lesion, or hydronephrosis. Bladder is unremarkable. Stomach/Bowel: No bowel obstruction, ileus or free air. No phlegm a tori process or mass identified. Lymphatic: No enlarged lymph nodes identified. Reproductive: Prostate is unremarkable. Other: There are 2 separate areas of large spontaneous hemorrhage. Large hematoma of the lower anterior right chest wall extends into the upper right  rectus sheath. Largest dimensions of the area of hemorrhage are roughly 14.5 x 6.0 x 12.6 cm. There also is spontaneous hemorrhage in the right retroperitoneum involving the psoas muscle and extending laterally into the retroperitoneal space. Intramuscular psoas hemorrhage extends into the pelvis. Largest dimensions of the right retroperitoneal bleed are roughly 8.2 x 6.4 x 16 cm. Musculoskeletal: No acute or significant osseous findings. Review of the MIP images confirms the above findings. IMPRESSION: 1. No change in appearance of the chronic type B aortic dissection involving the distal arch, descending thoracic aorta and abdominal aorta. No evidence to  suggest rupture or hemorrhage emanating from the aorta. 2. New spontaneous large hemorrhages involving the lower right anterior chest wall extending into the upper right rectus abdominal musculature and the right retroperitoneum involving the right iliopsoas muscle extending into the pelvis. 3. These results were called by telephone at the time of interpretation on 01/09/2018 at 11:15 am to Dr. Buford Dresser , who verbally acknowledged these results. Electronically Signed   By: Aletta Edouard M.D.   On: 01/09/2018 11:30    Cardiac Studies     Patient Profile     67 y.o.malew/ PMHof CAD (s/p CABG in 2009 with cath in 02/2008 showing occluded SVG-PDA and SVG-OM, repeat cath in 02/2015 showing patency of LIMA-LAD, SVG-D1, and free RIMA-Ramus and PCI/DES of native RCA), Type B aortic dissection, chronic hypoxic respiratory failure (on 3-4L [place] at baseline), HLD, and orthostatic hypotensionwho [facility] for worsening dyspnea. Cardiology consulted for evaluation of chest pain and elevated troponin values.   Assessment & Plan      1. Chest pain/ Elevated troponins -He has history of CAD with prior CABG, last cath 2017 where he received a stent to his native RCA. At that time LIMA-LAD patent, SVG-diag patent, RIMA-raums patent, occluded SVG-PDA and occluded SVG-OM.  - presented with worsening dyspnea after having recently been discharged for a COPD exacerbation.Cyclic troponin valueshave beenflat at0.16, 0.12, and 0.19peakthis admissionbut EKG tracings do show new TWI along the inferolateral leads -Echo this admit LVEF 55-60%. - convoluted chest pain symptoms in setting of COPD exacerbation  - chest pain yesterday, trops rechecked at 0.22 and flat.  Stable inferior/lateral precordial ST/T changes. Pain this AM with coughin not consistent with cardiac ischemia. Symptoms have been convoluted due to ongoing COPD exacerbation and coughing/chest  tightness  - cath plans cancelled after development of spontaneous significant chest wall/retroperitoneal bleed - keep Hgb 9 to 10   2. Retroperitioneal bleed - patient yesterday with severe lower back pain, drop in Hgb from 12 to 8 without clear source of external bleeding - CTA chest/abd/pelvis yesterday showed stable type B aortic dissection. Did show new spontaneous large bleeding lower right anterior chest wall extending into upper right rectus and the right retroperitoneum - hep gtt stopped, ASA stopped  - downtrend in Hgb since transfusion, followed by ICU team.    3. Anemia - sudden drop in Hgb from 12 to 8 during this admission in setting of retroperitoneal bleed, further decrease to 5.8.  - anticoag stopped, off ASA - transfused 2 units, monitoring counts off anticoag   For questions or updates, please contact [facility] Please consult [email or web address] for contact info under        Signed, Carlyle Dolly, MD  01/11/2018, 10:48 AM

## 2018-01-12 ENCOUNTER — Inpatient Hospital Stay (HOSPITAL_COMMUNITY): Payer: Medicare PPO

## 2018-01-12 ENCOUNTER — Encounter (HOSPITAL_COMMUNITY)
Admission: EM | Disposition: A | Discharge status: Home Health | Payer: Self-pay | Source: Home / Self Care | Attending: Internal Medicine

## 2018-01-12 DIAGNOSIS — I10 Essential (primary) hypertension: Secondary | ICD-10-CM

## 2018-01-12 DIAGNOSIS — J9621 Acute and chronic respiratory failure with hypoxia: Secondary | ICD-10-CM

## 2018-01-12 LAB — BASIC METABOLIC PANEL
Anion gap: 10 (ref 5–15)
BUN: 40 mg/dL — ABNORMAL HIGH (ref 8–23)
CO2: 28 mmol/L (ref 22–32)
CREATININE: 0.93 mg/dL (ref 0.61–1.24)
Calcium: 7.7 mg/dL — ABNORMAL LOW (ref 8.9–10.3)
Chloride: 101 mmol/L (ref 98–111)
GFR calc Af Amer: >60 mL/min (ref >60)
GFR calc non Af Amer: >60 mL/min (ref >60)
GLUCOSE: 112 mg/dL — AB (ref 70–99)
Potassium: 4.7 mmol/L (ref 3.5–5.1)
Sodium: 139 mmol/L (ref 135–145)

## 2018-01-12 LAB — CBC WITH DIFFERENTIAL/PLATELET
Abs Immature Granulocytes: 0.33 10*3/uL — ABNORMAL HIGH (ref 0.00–0.07)
Basophils Absolute: 0 10*3/uL (ref 0.0–0.1)
Basophils Relative: 0 %
Eosinophils Absolute: 0 10*3/uL (ref 0.0–0.5)
Eosinophils Relative: 0 %
HCT: 24.4 % — ABNORMAL LOW (ref 39.0–52.0)
Hemoglobin: 7.9 g/dL — ABNORMAL LOW (ref 13.0–17.0)
Immature Granulocytes: 2 %
Lymphocytes Relative: 8 %
Lymphs Abs: 1.5 10*3/uL (ref 0.7–4.0)
MCH: 30 pg (ref 26.0–34.0)
MCHC: 32.4 g/dL (ref 30.0–36.0)
MCV: 92.8 fL (ref 80.0–100.0)
MONOS PCT: 8 %
Monocytes Absolute: 1.5 10*3/uL — ABNORMAL HIGH (ref 0.1–1.0)
Neutro Abs: 14.6 10*3/uL — ABNORMAL HIGH (ref 1.7–7.7)
Neutrophils Relative %: 82 %
Platelets: 175 10*3/uL (ref 150–400)
RBC: 2.63 MIL/uL — ABNORMAL LOW (ref 4.22–5.81)
RDW: 15 % (ref 11.5–15.5)
WBC: 17.9 10*3/uL — ABNORMAL HIGH (ref 4.0–10.5)
nRBC: 0.1 % (ref 0.0–0.2)

## 2018-01-12 LAB — GLUCOSE, CAPILLARY
GLUCOSE-CAPILLARY: 107 mg/dL — AB (ref 70–99)
Glucose-Capillary: 120 mg/dL — ABNORMAL HIGH (ref 70–99)
Glucose-Capillary: 141 mg/dL — ABNORMAL HIGH (ref 70–99)

## 2018-01-12 LAB — HEMOGLOBIN AND HEMATOCRIT, BLOOD
HCT: 30.1 % — ABNORMAL LOW (ref 39.0–52.0)
Hemoglobin: 9.8 g/dL — ABNORMAL LOW (ref 13.0–17.0)

## 2018-01-12 LAB — PHOSPHORUS: Phosphorus: 2 mg/dL — ABNORMAL LOW (ref 2.5–4.6)

## 2018-01-12 LAB — MAGNESIUM: Magnesium: 1.8 mg/dL (ref 1.7–2.4)

## 2018-01-12 LAB — PREPARE RBC (CROSSMATCH)

## 2018-01-12 SURGERY — LEFT HEART CATH AND CORS/GRAFTS ANGIOGRAPHY
Anesthesia: LOCAL

## 2018-01-12 MED ORDER — FUROSEMIDE 10 MG/ML IJ SOLN
20.0000 mg | Freq: Once | INTRAMUSCULAR | Status: AC
  Administered 2018-01-12: 20 mg via INTRAVENOUS
  Filled 2018-01-12: qty 2

## 2018-01-12 MED ORDER — SODIUM CHLORIDE 0.9% IV SOLUTION
Freq: Once | INTRAVENOUS | Status: AC
  Administered 2018-01-12: 10:00:00 via INTRAVENOUS

## 2018-01-12 MED ORDER — SODIUM PHOSPHATES 45 MMOLE/15ML IV SOLN
10.0000 mmol | Freq: Once | INTRAVENOUS | Status: AC
  Administered 2018-01-12: 10 mmol via INTRAVENOUS
  Filled 2018-01-12: qty 3.33

## 2018-01-12 MED ORDER — SODIUM CHLORIDE 0.9 % IV SOLN
INTRAVENOUS | Status: DC | PRN
  Administered 2018-01-12: 1000 mL via INTRAVENOUS

## 2018-01-12 MED ORDER — NICOTINE 21 MG/24HR TD PT24
21.0000 mg | MEDICATED_PATCH | Freq: Every day | TRANSDERMAL | Status: DC
  Administered 2018-01-12 – 2018-01-18 (×7): 21 mg via TRANSDERMAL
  Filled 2018-01-12 (×7): qty 1

## 2018-01-12 NOTE — Progress Notes (Addendum)
Progress Note  Patient Name: [name] Date of Encounter: 01/12/2018  Primary Cardiologist: Candee Furbish, MD   Subjective   Still with CP. Right sided and pleuritic. Hurts to cough.   Inpatient Medications    Scheduled Meds: . atorvastatin  20 mg Oral q1800  . benzonatate  200 mg Oral BID  . budesonide (PULMICORT) nebulizer solution  0.5 mg Nebulization BID  . busPIRone  7.5 mg Oral TID  . dextromethorphan-guaiFENesin  1 tablet Oral BID  . feeding supplement (ENSURE ENLIVE)  237 mL Oral BID BM  . furosemide  20 mg Intravenous Once  . gabapentin  300 mg Oral TID  . levothyroxine  50 mcg Oral Q0600  . nicotine  21 mg Transdermal Daily  . pantoprazole  40 mg Oral QHS  . predniSONE  30 mg Oral Q breakfast   Continuous Infusions: . sodium chloride 1,000 mL (01/12/18 0901)  . sodium phosphate  Dextrose 5% IVPB     PRN Meds: sodium chloride, acetaminophen **OR** acetaminophen, ALPRAZolam, alum & mag hydroxide-simeth, bisacodyl, hydrALAZINE, ipratropium-albuterol, ondansetron **OR** ondansetron (ZOFRAN) IV, oxyCODONE-acetaminophen, polyethylene glycol   Vital Signs    Vitals:   01/12/18 0900 01/12/18 0937 01/12/18 0951 01/12/18 0952  BP: 137/74 129/73  138/73  Pulse:      Resp: 17 13  18   Temp:  97.8 F (36.6 C)  97.8 F (36.6 C)  TempSrc:  Oral  Oral  SpO2: 100% 100% 100% 100%  Weight:      Height:        Intake/Output Summary (Last 24 hours) at 01/12/2018 1002 Last data filed at 01/12/2018 0908 Gross per 24 hour  Intake 1890.52 ml  Output 1000 ml  Net 890.52 ml   Filed Weights   01/05/18 1950 01/06/18 0617 01/10/18 0430  Weight: 67.1 kg 67.1 kg 70.2 kg    Telemetry    NSR - Personally Reviewed  ECG    Not performed today - Personally Reviewed  Physical Exam   GEN: No acute distress.   Neck: No JVD Cardiac: RRR, no murmurs, rubs, or gallops.  Respiratory: Clear to auscultation bilaterally. GI: Soft, nontender, non-distended  MS: No edema; No  deformity. Neuro:  Nonfocal  Psych: Normal affect   Labs    Chemistry Recent Labs  Lab 01/10/18 0546 01/11/18 0218 01/12/18 0315  NA 138 138 139  K 4.6 4.6 4.7  CL 99 100 101  CO2 31 30 28   GLUCOSE 117* 134* 112*  BUN 56* 57* 40*  CREATININE 1.26* 1.13 0.93  CALCIUM 7.8* 7.8* 7.7*  GFRNONAA 59* >60 >60  GFRAA >60 >60 >60  ANIONGAP 8 8 10      Hematology Recent Labs  Lab 01/11/18 0218 01/11/18 1749 01/12/18 0315  WBC 18.8* 19.0* 17.9*  RBC 2.42* 2.62* 2.63*  HGB 7.6* 8.2* 7.9*  HCT 22.9* 24.3* 24.4*  MCV 94.6 92.7 92.8  MCH 31.4 31.3 30.0  MCHC 33.2 33.7 32.4  RDW 14.8 14.6 15.0  PLT 160 176 175    Cardiac Enzymes Recent Labs  Lab 01/10/18 1405 01/10/18 2013 01/11/18 0218 01/11/18 0822  TROPONINI 0.18* 0.12* 0.22* 0.22*    Recent Labs  Lab 01/05/18 1231  TROPIPOC 0.17*     BNP Recent Labs  Lab 01/05/18 1226  BNP 422.0*     DDimer No results for input(s): DDIMER in the last 168 hours.   Radiology    Dg Chest Port 1 View  Result Date: 01/12/2018 CLINICAL DATA:  Abnormal respiration.  EXAM: PORTABLE CHEST 1 VIEW COMPARISON:  01/11/2018. FINDINGS: Prior CABG. Heart size stable. No pulmonary venous congestion. Mild left base subsegmental atelectasis. Tiny stable bilateral pleural effusions. No pneumothorax. IMPRESSION: 1.  Prior CABG.  Heart size stable.  No pulmonary venous congestion. 2. Mild left base subsegmental axis. Tiny bilateral pleural effusions again noted. No interim change. Electronically Signed   By: Marcello Moores  Register   On: 01/12/2018 06:47   Dg Chest Port 1 View  Result Date: 01/11/2018 CLINICAL DATA:  Acute respiratory failure EXAM: PORTABLE CHEST 1 VIEW COMPARISON:  01/05/2018 FINDINGS: Postop CABG. Negative for heart failure or edema. Improvement in by small bilateral pleural effusions. Negative for pneumonia. IMPRESSION: Lungs remain clear without edema or pneumonia. Improvement in small bilateral effusions. Electronically Signed    By: Franchot Gallo M.D.   On: 01/11/2018 07:04    Cardiac Studies   2D Echo 01/06/18 Study Conclusions  - Left ventricle: The cavity size was normal. Wall thickness was   increased in a pattern of moderate LVH. Systolic function was   normal. The estimated ejection fraction was in the range of 55%   to 60%. - Aortic valve: Mildly calcified annulus. Mildly thickened   leaflets. Valve area (VTI): 2.26 cm^2. Valve area (Vmax): 2.26   cm^2. - Mitral valve: Mildly calcified annulus. Mildly thickened leaflets   . - Technically difficult study.   Patient Profile     [name] is a 67 y.o. male with past medical history of CAD (s/p CABG in 2009 with cath in 02/2008 showing occluded SVG-PDA and SVG-OM, repeat cath in 02/2015 showing patency of LIMA-LAD, SVG-D1, and free RIMA-Ramus and PCI/DES of native RCA), Type B aortic dissection, chronic hypoxic respiratory failure (on 3-4L [place] at baseline), HLD, and orthostatic hypotension  who was admitted for chest pain and elevated troponin. He was started on IV heparin with plans for cath but on 12/20, he developed severe back pain, hypotension with SBP 60's and AMS and CT abd revealed large retroperitoneal bleed (previously known type B aortic dissection was stable).   Assessment & Plan    1. Hypovolemic, Hemorrhagic Shock: 2/2 retroperitoneal bleed. IV heparin discontinued. volume resuscitation/ blood transfusion. Critical Care managing.   2. Retroperitoneal Bleed: per above.   3. Chest Pain/ Elevated Troponin: He has history of CAD with prior CABG, last cath 2017 where he received a stent to his native RCA. At that time LIMA-LAD patent, SVG-diag patent, RIMA-raums patent, occluded SVG-PDA and occluded SVG-OM. He presented with worsening dyspnea after having recently been discharged for a COPD exacerbation.Initial cyclic troponin valueswereflat at0.16, 0.12, and 0.19. F/u troponins after development of RPB bumped slightly to 0.22 and flat. EKG  tracings do show new TWI along the inferolateral leads. Echo this admit LVEF 55-60%. Convoluted chest pain symptoms in setting of COPD exacerbation. Initial plans were for [facility], however this was canceled after development of spontaneous significant chest wall/retroperitoneal bleed. IV heparin has been discontinued. Plan for now is medical therapy. ASA on hold currently, as well as  blocker due to earlier hypotension. Continue on statin. Also try to keep Hgb between 9-10 to reduce risk of demand ischemia/ angina.    4. ABLA: sudden drop in Hgb from 12 to 8 during this admission in setting of retroperitoneal bleed, further decrease to 5.8. Received blood transfusion. Hgb improved to 9.3 but has been trending downward again, now at 7.9. Given underlying CAD, try to keep Hgb between 9-10. Plan is for another transfusion today. May  need further radiological work-up if hgb trends back down again.    For questions or updates, please contact [facility] Please consult [email or web address] for contact info under        Signed, Lyda Jester, PA-C  01/12/2018, 10:02 AM    Attending Note:   The patient was seen and examined.  Agree with assessment and plan as noted above.  Changes made to the above note as needed.  Patient seen and independently examined with Lyda Jester, PA .   We discussed all aspects of the encounter. I agree with the assessment and plan as stated above.  1.  Chest pain: The patient's troponin levels are minimally elevated and are flat.  It is quite likely that this really represents a COPD exacerbation. He has had a large retroperitoneal bleed and at this point is not a candidate for interventional cardiology procedure.  2.  Type B aortic dissection: Aortic dissection appears to be chronic and stable.  It does not seem to be related to the retroperitoneal bleed.   I have spent a total of 40 minutes with patient reviewing hospital  notes , telemetry, EKGs, labs and examining  patient as well as establishing an assessment and plan that was discussed with the patient. > 50% of time was spent in direct patient care.    Thayer Headings, Brooke Bonito., MD, [facility] 01/12/2018, 3:27 PM [address],  [place] Pager [phone]

## 2018-01-12 NOTE — Progress Notes (Signed)
NAME:  [name], MRN:  [id number], DOB:  [birth year], LOS: 6 ADMISSION DATE:  01/05/2018, CONSULTATION DATE:  12/20 REFERRING MD:  Kyung Bacca CHIEF COMPLAINT:  Hypotension, retroperitoneal bleed    Brief History   67yo male with hx chronic hypoxic respiratory failure on 3-4L home O2 r/t COPD, CAD s/p CABG 2009 with known severe 3V disease on cath 2017, HTN, chronic type B aortic dissection with recent admission for AECOPD, re-admitted 12/16 to [facility] with chest pain and SOB.  W/u neg for PE but did reveal elevated troponin (0.16, 0.12, 0.19) and some EKG changes and was therefore seen by cardiology, started on heparin gtt and ASA and tx [facility] for [facility] cardiac cath.  On 12/20 developed severe back pain, hypotension with SBP 60's and AMS and CT abd revealed large retroperitoneal bleed. PCCM consulted for ICU tx.   Past Medical History   has a past medical history of Anemia, Anxiety, Aortic dissection ([place]), CAD (coronary artery disease), Cardiac tamponade, Chronic bronchitis (HCC), Chronic fatigue, Chronic pain, Constipation, COPD (chronic obstructive pulmonary disease) (HCC), Depression, Dysphagia, oropharyngeal phase, ETOH abuse, GERD (gastroesophageal reflux disease), Hiatal hernia, HTN (hypertension), Hypercholesterolemia, Metabolic encephalopathy, Opioid abuse ([place]), Peptic ulcer disease (08/2010), PNA (pneumonia), and Schatzki's ring.  Significant Hospital Events   12/26- Admit to [facility] 12/16 with NSTEMI 12/20- To ICU with RP hemorrhage, hypotension  Consults:  Cardiology 12/17>>> PCCM  12/20>>  Procedures:    Significant Diagnostic Tests:  CTA chest 12/16>>> 1. 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 2. Interval thrombosis of the false lumen of the  dissection with the exception of a small amount of persistent opacified blood in the aortic arch superiorly on the left. 3. Mild changes of COPD. 4. Calcific coronary artery and aortic atherosclerosis and post CABG Changes  CT head 12/19>>> neg acute   CTA chest/abd/pelvis 12/20- No change in chronic type B aortic dissection, new large hemorrhage involving right anterior chest wall, upper abdominal wall, right retroperitoneum extending into the pelvis.  Micro Data:    Antimicrobials:     Interim history/subjective:  Still some chest pain, worse with coughing.  Still some SOB above baseline.   Objective   Blood pressure 137/74, pulse 68, temperature 98 F (36.7 C), temperature source Oral, resp. rate 17, height 5\' 10"  (1.778 m), weight 70.2 kg, SpO2 100 %.        Intake/Output Summary (Last 24 hours) at 01/12/2018 0906 Last data filed at 01/12/2018 0500 Gross per 24 hour  Intake 1550.65 ml  Output 800 ml  Net 750.65 ml   Filed Weights   01/05/18 1950 01/06/18 0617 01/10/18 0430  Weight: 67.1 kg 67.1 kg 70.2 kg   Examination: General: Thin frail male no acute distress complains of back and abdominal pain HEENT: No JVD no lymphadenopathy is appreciated Neuro: More awake and interactive moves all extremities follows commands CV: Heart sounds are regular PULM: Rhonchi bilaterally, congested cough noted GI: Tense, tender positive bowel sounds Extremities: warm/dry negative edema  Skin: no rashes or lesions   Resolved Hospital Problem list     Assessment & Plan:   Hypotension/ hemorrhagic shock - in setting retroperitoneal/chest wall bleed.  Blood loss anemia  Large chest wall/ retroperitoneal bleed  PLAN -  Will give addition 1 unit PRBC  today- goal hgb 9-10 with ?NSTEMI F/u CBC daily  May need further radiological work-up if hgb continue to drift    AMS - resolved  PLAN -  Supportive care   CAD  NSTEMI  PLAN -  Cardiology following  No heparin for now   Cath plans on hold for now  1 unit PRBC as above  Lasix 20mg  IV after blood  KVO IVF   Chronic hypoxic respiratory failure on 3-4L home O2  Recent AECOPD  PLAN -  Prednisone with slow taper - down to 30mg /day on 12/22  O2 as needed - wears 3-4L at baseline  BD's   Chronic low back pain  PLAN -  Off sedating medication Tylenol as needed  Known chronic type B aortic dissection - stable on CT  PLAN -  Continue to monitor Consider further/repeat imaging if hgb drifts   Hypophosphatemia  PLAN -  Replete phos  F/u chem    Best practice:  Diet: Regular diet Pain/Anxiety/Delirium protocol (if indicated): holding sedating medication  VAP protocol (if indicated): n/a DVT prophylaxis: SCD's  GI prophylaxis: n/a Glucose control:  Mobility: OOB  Code Status: full Family Communication: no family at bedside on NP rounds 12/23  tx tele  Will ask [facility] to resume care 12/24    Labs   CBC: Recent Labs  Lab 01/05/18 1226  01/10/18 1405 01/10/18 1802 01/11/18 0218 01/11/18 1749 01/12/18 0315  WBC 13.1*   < > 20.1* 20.4* 18.8* 19.0* 17.9*  NEUTROABS 10.8*  --   --   --   --  16.9* 14.6*  HGB 13.1   < > 8.3* 8.2* 7.6* 8.2* 7.9*  HCT 43.0   < > 24.9* 24.5* 22.9* 24.3* 24.4*  MCV 101.7*   < > 92.9 93.2 94.6 92.7 92.8  PLT 223   < > 170 169 160 176 175   < > = values in this interval not displayed.    Basic Metabolic Panel: Recent Labs  Lab 01/07/18 0514 01/09/18 0404 01/10/18 0546 01/11/18 0218 01/12/18 0315  NA 135 135 138 138 139  K 4.4 4.8 4.6 4.6 4.7  CL 92* 91* 99 100 101  CO2 33* 31 31 30 28   GLUCOSE 170* 207* 117* 134* 112*  BUN 37* 53* 56* 57* 40*  CREATININE 1.00 1.36* 1.26* 1.13 0.93  CALCIUM 8.4* 8.0* 7.8* 7.8* 7.7*  MG  --   --  2.1 2.1 1.8  PHOS  --   --  3.5 3.1 2.0*   GFR: Estimated Creatinine Clearance: 77.6 mL/min (by C-G formula based on SCr of 0.93 mg/dL). Recent Labs  Lab 01/10/18 1802 01/11/18 0218 01/11/18 1749 01/12/18 0315  WBC  20.4* 18.8* 19.0* 17.9*    Liver Function Tests: No results for input(s): AST, ALT, ALKPHOS, BILITOT, PROT, ALBUMIN in the last 168 hours. No results for input(s): LIPASE, AMYLASE in the last 168 hours. Recent Labs  Lab 01/08/18 0054  AMMONIA 27    ABG    Component Value Date/Time   PHART 7.426 01/08/2018 0044   PCO2ART 55.2 (H) 01/08/2018 0044   PO2ART 63.0 (L) 01/08/2018 0044   HCO3 33.9 (H) 01/08/2018 0044   TCO2 48.2 08/03/2015 0845   ACIDBASEDEF 8.8 (H) 04/04/2015 0350   O2SAT 91.0 01/08/2018 0044     Coagulation Profile: Recent Labs  Lab 01/08/18 0004  INR 1.08    Cardiac Enzymes: Recent Labs  Lab 01/07/18 0514 01/10/18 1405 01/10/18 2013 01/11/18 0218 01/11/18 0822  TROPONINI 0.08*  0.18* 0.12* 0.22* 0.22*    HbA1C: Hgb A1c MFr Bld  Date/Time Value Ref Range Status  11/16/2015 11:09 PM 5.3 4.8 - 5.6 % Final    Comment:    (NOTE)         Pre-diabetes: 5.7 - 6.4         Diabetes: >6.4         Glycemic control for adults with diabetes: <7.0     CBG: Recent Labs  Lab 01/11/18 1615 01/11/18 1933 01/11/18 2327 01/12/18 0340 01/12/18 0725  GLUCAP 147* [place]     Katy Ieisha Gao, NP 01/12/2018  9:06 AM Pager: [phone] or [phone]

## 2018-01-13 DIAGNOSIS — R0600 Dyspnea, unspecified: Secondary | ICD-10-CM

## 2018-01-13 DIAGNOSIS — J9601 Acute respiratory failure with hypoxia: Secondary | ICD-10-CM

## 2018-01-13 LAB — BPAM RBC
Blood Product Expiration Date: 202001042359
Blood Product Expiration Date: 202001052359
Blood Product Expiration Date: 202001052359
Blood Product Expiration Date: 202001052359
Blood Product Expiration Date: 202001062359
ISSUE DATE / TIME: 201912201750
ISSUE DATE / TIME: 201912202003
ISSUE DATE / TIME: 201912221129
ISSUE DATE / TIME: 201912230925
Unit Type and Rh: 600
Unit Type and Rh: 600
Unit Type and Rh: 600
Unit Type and Rh: 600
Unit Type and Rh: 600

## 2018-01-13 LAB — TYPE AND SCREEN
ABO/RH(D): A NEG
Antibody Screen: NEGATIVE
Unit division: 0
Unit division: 0
Unit division: 0
Unit division: 0
Unit division: 0

## 2018-01-13 LAB — CBC
HCT: 29.3 % — ABNORMAL LOW (ref 39.0–52.0)
Hemoglobin: 9.7 g/dL — ABNORMAL LOW (ref 13.0–17.0)
MCH: 30.9 pg (ref 26.0–34.0)
MCHC: 33.1 g/dL (ref 30.0–36.0)
MCV: 93.3 fL (ref 80.0–100.0)
Platelets: 209 10*3/uL (ref 150–400)
RBC: 3.14 MIL/uL — ABNORMAL LOW (ref 4.22–5.81)
RDW: 15.2 % (ref 11.5–15.5)
WBC: 19.8 10*3/uL — ABNORMAL HIGH (ref 4.0–10.5)
nRBC: 0 % (ref 0.0–0.2)

## 2018-01-13 LAB — GLUCOSE, CAPILLARY: Glucose-Capillary: 162 mg/dL — ABNORMAL HIGH (ref 70–99)

## 2018-01-13 LAB — BASIC METABOLIC PANEL
ANION GAP: 9 (ref 5–15)
BUN: 30 mg/dL — ABNORMAL HIGH (ref 8–23)
CO2: 31 mmol/L (ref 22–32)
Calcium: 8 mg/dL — ABNORMAL LOW (ref 8.9–10.3)
Chloride: 94 mmol/L — ABNORMAL LOW (ref 98–111)
Creatinine, Ser: 0.97 mg/dL (ref 0.61–1.24)
GFR calc Af Amer: >60 mL/min (ref >60)
GFR calc non Af Amer: >60 mL/min (ref >60)
GLUCOSE: 130 mg/dL — AB (ref 70–99)
Potassium: 4 mmol/L (ref 3.5–5.1)
Sodium: 134 mmol/L — ABNORMAL LOW (ref 135–145)

## 2018-01-13 LAB — PHOSPHORUS: Phosphorus: 2.4 mg/dL — ABNORMAL LOW (ref 2.5–4.6)

## 2018-01-13 LAB — MAGNESIUM: Magnesium: 1.6 mg/dL — ABNORMAL LOW (ref 1.7–2.4)

## 2018-01-13 MED ORDER — METOPROLOL TARTRATE 25 MG PO TABS
25.0000 mg | ORAL_TABLET | Freq: Two times a day (BID) | ORAL | Status: DC
  Administered 2018-01-13 – 2018-01-18 (×11): 25 mg via ORAL
  Filled 2018-01-13 (×11): qty 1

## 2018-01-13 MED ORDER — TRAZODONE HCL 100 MG PO TABS
100.0000 mg | ORAL_TABLET | Freq: Every evening | ORAL | Status: DC | PRN
  Administered 2018-01-13 – 2018-01-16 (×2): 100 mg via ORAL
  Filled 2018-01-13 (×2): qty 1

## 2018-01-13 NOTE — Progress Notes (Signed)
[facility]                                                                              Patient Demographics  [name], is a 67 y.o. male, DOB - Feb 02, 1950, FVC:[id number]  Admit date - 01/05/2018   Admitting Physician Ejiroghene Arlyce Dice, MD  Outpatient Primary MD for the patient is Shelbie Ammons, MD  Outpatient specialists:   LOS - 7  days   Medical records reviewed and are as summarized below:    Chief Complaint  Patient presents with  . Shortness of Breath       Brief summary   67yo male with hx chronic hypoxic respiratory failure on 3-4L home O2 r/t COPD, CAD s/p CABG 2009 with known severe 3V disease on cath 2017, HTN, chronic type B aortic dissection with recent admission for AECOPD, re-admitted 12/16 to [facility] with chest pain and SOB.  W/u neg for PE but did reveal elevated troponin (0.16, 0.12, 0.19) and some EKG changes and was therefore seen by cardiology, started on heparin gtt and ASA and tx to [facility] for tentative cardiac cath.  On 12/20 developed severe back pain, hypotension with SBP 60's and AMS and CT abd revealed large retroperitoneal bleed.  Patient was transferred to ICU under PCCM care Patient transferred back to floor, [place] service assumed care on 12/24 12/26- Admit to [facility] 12/16 with NSTEMI 12/20- To ICU with RP hemorrhage, hypotension  Assessment & Plan    Hemorrhagic shock with hypotension in the setting of retroperitoneal/ chest wall bleed, acute blood loss anemia -Goal hemoglobin 9-10 with NSTEMI, has received RBC transfusions -Complaining of abdominal pain and back pain.  Hemoglobin today stable, if trending down, will repeat CT abdomen.   NSTEMI -Patient had initially presented with worsening dyspnea, elevated troponins, new T wave inversions in lateral leads -2D echo showed EF of 55 to 60%, patient was started on heparin drip. -Initial plans were for left heart cath however patient developed retroperitoneal bleed, IV  heparin and aspirin were discontinued.  Cardiology now recommends medical therapy not a candidate for interventional procedures. -Hemoglobin goal between 9 and 10.  Acute on chronic hypoxic respiratory failure with COPD exacerbation -Currently improving, continue O2, bronchodilators, prednisone with slow taper -Placed on PPI  Chronic low back pain -Continue Percocet as needed  Known chronic type B aortic dissection chronic and stable, cardiology following  Essential hypertension BP currently controlled   Code Status: Full CODE STATUS DVT Prophylaxis:  SCD's Family Communication: Discussed in detail with the patient, all imaging results, lab results explained to the patient    Disposition Plan: High risk of deterioration, continue inpatient  Time Spent in minutes 25 minutes  Procedures:  CT angios chest abdomen and pelvis  Consultants:   Cardiology  Antimicrobials:      Medications  Scheduled Meds: . atorvastatin  20 mg Oral q1800  . benzonatate  200 mg Oral BID  . budesonide (PULMICORT) nebulizer solution  0.5 mg Nebulization BID  . busPIRone  7.5 mg Oral TID  . dextromethorphan-guaiFENesin  1 tablet Oral BID  . feeding supplement (ENSURE ENLIVE)  237 mL  Oral BID BM  . gabapentin  300 mg Oral TID  . levothyroxine  50 mcg Oral Q0600  . nicotine  21 mg Transdermal Daily  . pantoprazole  40 mg Oral QHS  . predniSONE  30 mg Oral Q breakfast   Continuous Infusions: . sodium chloride Stopped (01/12/18 0932)   PRN Meds:.sodium chloride, acetaminophen **OR** acetaminophen, ALPRAZolam, alum & mag hydroxide-simeth, bisacodyl, hydrALAZINE, ipratropium-albuterol, ondansetron **OR** ondansetron (ZOFRAN) IV, oxyCODONE-acetaminophen, polyethylene glycol, traZODone   Antibiotics   Anti-infectives (From admission, onward)   None        Subjective:   [name] was seen and examined today.  Complaining of right upper quadrant abdominal pain, no chest pain or shortness  of breath.  Patient denies any nausea or vomiting.  No fevers  Objective:   Vitals:   01/12/18 1950 01/13/18 0002 01/13/18 0445 01/13/18 0729  BP:  124/72 124/74   Pulse:  75 72   Resp:      Temp:   97.6 F (36.4 C)   TempSrc:   Oral   SpO2: 100% 98% 98% 99%  Weight:   72.1 kg   Height:        Intake/Output Summary (Last 24 hours) at 01/13/2018 1050 Last data filed at 01/13/2018 0600 Gross per 24 hour  Intake 1285.02 ml  Output 2050 ml  Net -764.98 ml     Wt Readings from Last 3 Encounters:  01/13/18 72.1 kg  12/31/17 74.8 kg  05/27/17 65.8 kg     Exam  General: Alert and oriented x 3, NAD  Eyes: PERRLA, EOMI, Anicteric Sclera,  HEENT:   Cardiovascular: S1 S2 auscultated,  Regular rate and rhythm.  Respiratory: Clear to auscultation bilaterally  Gastrointestinal: Soft, mild diffuse tenderness, slightly worse in the epigastric area  nondistended, + bowel sounds  Ext: no pedal edema bilaterally  Neuro: No new deficits  Musculoskeletal: No digital cyanosis, clubbing  Skin: No rashes  Psych: Normal affect and demeanor, alert and oriented x3    Data Reviewed:  I have personally reviewed following labs and imaging studies  Micro Results Recent Results (from the past 240 hour(s))  MRSA PCR Screening     Status: None   Collection Time: 01/05/18  7:53 PM  Result Value Ref Range Status   MRSA by PCR NEGATIVE NEGATIVE Final    Comment:        The GeneXpert MRSA Assay (FDA approved for NASAL specimens only), is one component of a comprehensive MRSA colonization surveillance program. It is not intended to diagnose MRSA infection nor to guide or monitor treatment for MRSA infections. Performed at [facility], [address] [place], [place] [address]     Radiology Reports Ct Angio Chest Pe W And/or Wo Contrast  Result Date: 01/05/2018 CLINICAL DATA:  Increased shortness of breath since discharge from the hospital on 01/04/2018. EXAM: CT  ANGIOGRAPHY CHEST WITH CONTRAST TECHNIQUE: Multidetector CT imaging of the chest was performed using the standard protocol during bolus administration of intravenous contrast. Multiplanar CT image reconstructions and MIPs were obtained to evaluate the vascular anatomy. CONTRAST:  123mL ISOVUE-370 IOPAMIDOL (ISOVUE-370) INJECTION 76% COMPARISON:  Formal chest obtained earlier today and chest CTA dated 04/03/2015. FINDINGS: Cardiovascular: Previously demonstrated Stanford type B dissection of the aorta beginning just beyond the origin of the left subclavian artery and extending into the upper abdomen to the level of the origin of the renal arteries with extension into the proximal left renal artery, causing approximately 90+% stenosis of the  proximal left renal artery. There has been interval thrombosis of the false lumen of the dissection with the exception of a small amount of persistent opacified blood in the aortic arch superiorly on the left. No significant narrowing of the true lumen of the aorta is seen. Stable post CABG changes. Atheromatous calcifications, including the coronary arteries and aorta. Mediastinum/Nodes: No enlarged mediastinal, hilar, or axillary lymph nodes. Thyroid gland, trachea, and esophagus demonstrate no significant findings. Lungs/Pleura: The lungs remain mildly hyperexpanded with mild bullous changes. Minimal residual atelectasis or scarring at the right lung base posteriorly. No pleural fluid. Upper Abdomen: Aortic dissection extending into the proximal left renal artery, as described above. Musculoskeletal: Motion artifacts making it difficult to assess for rib and sternal fractures. Thoracic spine degenerative changes and mild scoliosis. Lower cervical spine degenerative changes. Review of the MIP images confirms the above findings. IMPRESSION: 1. Previously demonstrated Stanford type B dissection of the aorta beginning just beyond the origin of the left subclavian artery and  extending into the upper abdomen and into the proximal left renal artery, causing approximately 90+% stenosis of the proximal left renal artery. 2. Interval thrombosis of the false lumen of the dissection with the exception of a small amount of persistent opacified blood in the aortic arch superiorly on the left. 3. Mild changes of COPD. 4. Calcific coronary artery and aortic atherosclerosis and post CABG changes. Aortic Atherosclerosis (ICD10-I70.0) and Emphysema (ICD10-J43.9). Electronically Signed   By: Claudie Revering M.D.   On: 01/05/2018 15:40   Dg Chest Port 1 View  Result Date: 01/12/2018 CLINICAL DATA:  Abnormal respiration. EXAM: PORTABLE CHEST 1 VIEW COMPARISON:  01/11/2018. FINDINGS: Prior CABG. Heart size stable. No pulmonary venous congestion. Mild left base subsegmental atelectasis. Tiny stable bilateral pleural effusions. No pneumothorax. IMPRESSION: 1.  Prior CABG.  Heart size stable.  No pulmonary venous congestion. 2. Mild left base subsegmental axis. Tiny bilateral pleural effusions again noted. No interim change. Electronically Signed   By: Marcello Moores  Register   On: 01/12/2018 06:47   Dg Chest Port 1 View  Result Date: 01/11/2018 CLINICAL DATA:  Acute respiratory failure EXAM: PORTABLE CHEST 1 VIEW COMPARISON:  01/05/2018 FINDINGS: Postop CABG. Negative for heart failure or edema. Improvement in by small bilateral pleural effusions. Negative for pneumonia. IMPRESSION: Lungs remain clear without edema or pneumonia. Improvement in small bilateral effusions. Electronically Signed   By: Franchot Gallo M.D.   On: 01/11/2018 07:04   Dg Chest Port 1 View  Result Date: 01/05/2018 CLINICAL DATA:  PT was just d/c yesterday afternoon for acute on chronic copd. became short of breath shortly after leaving yesterday. Hx pneumonia/htn/ex smoker/hx heart cath EXAM: PORTABLE CHEST 1 VIEW COMPARISON:  CT chest 04/03/2015 FINDINGS: The lungs are hyperinflated likely secondary to COPD. There is mild  bilateral interstitial thickening likely chronic. There is no pleural effusion or pneumothorax. The heart and mediastinal contours are unremarkable. There is dilatation of the aortic arch. The osseous structures are unremarkable. IMPRESSION: No active disease. Dilatation of the aortic arch. Electronically Signed   By: Kathreen Devoid   On: 01/05/2018 12:20   Dg Chest Port 1 View  Result Date: 12/31/2017 CLINICAL DATA:  Shortness of breath EXAM: PORTABLE CHEST 1 VIEW COMPARISON:  March 17, 2017 FINDINGS: Lungs are somewhat hyperexpanded. There is chronic blunting of the left costophrenic angle. There is no frank edema or consolidation. Heart size and pulmonary vascularity are normal. No adenopathy. Aorta is mildly prominent but stable. No adenopathy. There is evidence  of old trauma involving each lateral clavicle. IMPRESSION: Lungs hyperexpanded with chronic blunting of the left costophrenic angle. No edema or consolidation. Stable cardiac silhouette. Aortic prominence is likely indicative of chronic hypertensive change. Electronically Signed   By: Lowella Grip III M.D.   On: 12/31/2017 12:39   Ct Angio Chest/abd/pel For Dissection W And/or W/wo  Result Date: 01/09/2018 CLINICAL DATA:  Acute drop in hemoglobin over the last 2 days with severe back pain and known chronic type B dissection involving the aortic arch, descending thoracic aorta and abdominal aorta. EXAM: CT ANGIOGRAPHY CHEST, ABDOMEN AND PELVIS TECHNIQUE: Multidetector CT imaging through the chest, abdomen and pelvis was performed using the standard protocol during bolus administration of intravenous contrast. Multiplanar reconstructed images and MIPs were obtained and reviewed to evaluate the vascular anatomy. CONTRAST:  169mL ISOVUE-370 IOPAMIDOL (ISOVUE-370) INJECTION 76% COMPARISON:  Prior CTA of the chest on 01/05/2018. CTA of the chest, abdomen and pelvis on 04/24/2017. FINDINGS: CTA CHEST FINDINGS Cardiovascular: Stable appearance of  chronic dissection beginning at the origin of the left subclavian artery and extending down the descending thoracic aorta. Maximum diameter of the distal arch is 4.5 cm which is stable. Maximum diameter of the descending thoracic aorta is approximately 4 cm and is stable. Proximal great vessels show stable and normal patency. There is stable irregular outpouching of the true lumen into the false lumen at the top of the aortic arch measuring roughly 2.7 cm in greatest diameter. No evidence of acute hemorrhage in the chest. The heart size is stable. No pericardial fluid identified. The ascending thoracic aorta is normal in caliber and shows no evidence of dissection. Central pulmonary arteries are normal in caliber and well opacified demonstrating normal patency. Mediastinum/Nodes: No enlarged mediastinal, hilar, or axillary lymph nodes. Thyroid gland, trachea, and esophagus demonstrate no significant findings. Lungs/Pleura: Stable emphysematous lung disease. There is no evidence of pulmonary edema, consolidation, pneumothorax, nodule or pleural fluid. Musculoskeletal: Beginning in the lower right chest wall, there is evidence of hemorrhage anteriorly continuing into the upper abdominal wall. This is further described in the CTA abdomen report below. Review of the MIP images confirms the above findings. CTA ABDOMEN AND PELVIS FINDINGS VASCULAR Aorta: Stable evidence of dissection extending down the abdominal aorta and into the proximal right common iliac artery. Stable narrowing of the proximal right common iliac artery. Stable aneurysmal dilatation of the abdominal aorta measuring approximately 4.6 x 4.7 cm. No evidence of aneurysm rupture. Celiac: . small caliber celiac trunk emanating off of the true lumen and otherwise demonstrating normal patency. SMA: SMA originates off of the true lumen and demonstrates normal patency. Renals: Bilateral renal arteries emanate off of the true lumen and demonstrate normal  patency. 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 Inflow: Bilateral iliac artery patency stable with stable stenosis of the proximal right common iliac artery of approximately 60-70%. Stable small caliber bilateral external iliac arteries. Review of the MIP images confirms the above findings. NON-VASCULAR Hepatobiliary: No focal liver abnormality is seen. No gallstones, gallbladder wall thickening, or biliary dilatation. Pancreas: Unremarkable. No pancreatic ductal dilatation or surrounding inflammatory changes. Spleen: Normal in size without focal abnormality. Adrenals/Urinary Tract: Adrenal glands are unremarkable. Kidneys are normal, without renal calculi, focal lesion, or hydronephrosis. Bladder is unremarkable. Stomach/Bowel: No bowel obstruction, ileus or free air. No phlegm a tori process or mass identified. Lymphatic: No enlarged lymph nodes identified. Reproductive: Prostate is unremarkable. Other: There are 2 separate areas of large spontaneous hemorrhage. Large hematoma of the  lower anterior right chest wall extends into the upper right rectus sheath. Largest dimensions of the area of hemorrhage are roughly 14.5 x 6.0 x 12.6 cm. There also is spontaneous hemorrhage in the right retroperitoneum involving the psoas muscle and extending laterally into the retroperitoneal space. Intramuscular psoas hemorrhage extends into the pelvis. Largest dimensions of the right retroperitoneal bleed are roughly 8.2 x 6.4 x 16 cm. Musculoskeletal: No acute or significant osseous findings. Review of the MIP images confirms the above findings. IMPRESSION: 1. No change in appearance of the chronic type B aortic dissection involving the distal arch, descending thoracic aorta and abdominal aorta. No evidence to suggest rupture or hemorrhage emanating from the aorta. 2. New spontaneous large hemorrhages involving the lower right anterior chest wall extending into the upper right rectus abdominal musculature and the right  retroperitoneum involving the right iliopsoas muscle extending into the pelvis. 3. These results were called by telephone at the time of interpretation on 01/09/2018 at 11:15 am to Dr. Buford Dresser , who verbally acknowledged these results. Electronically Signed   By: Aletta Edouard M.D.   On: 01/09/2018 11:30   Ct Head Code Stroke Wo Contrast  Result Date: 01/08/2018 CLINICAL DATA:  Code stroke. EXAM: CT HEAD WITHOUT CONTRAST TECHNIQUE: Contiguous axial images were obtained from the base of the skull through the vertex without intravenous contrast. COMPARISON:  Head CT 05/11/2015 FINDINGS: Brain: There is no mass, hemorrhage or extra-axial collection. There is generalized atrophy without lobar predilection. Areas of hypoattenuation of the deep gray nuclei and confluent periventricular white matter hypodensity, consistent with chronic small vessel disease. Vascular: Atherosclerotic calcification of the vertebral and internal carotid arteries at the skull base. No abnormal hyperdensity of the major intracranial arteries or dural venous sinuses. Skull: The visualized skull base, calvarium and extracranial soft tissues are normal. Sinuses/Orbits: No fluid levels or advanced mucosal thickening of the visualized paranasal sinuses. No mastoid or middle ear effusion. The orbits are normal. ASPECTS [facility] Stroke Program Early CT Score) - Ganglionic level infarction (caudate, lentiform nuclei, internal capsule, insula, M1-M3 cortex): 7 - Supraganglionic infarction (M4-M6 cortex): 3 Total score (0-10 with 10 being normal): 10 IMPRESSION: 1. CLINICAL DATA:  Code stroke. EXAM: CT HEAD WITHOUT CONTRAST TECHNIQUE: Contiguous axial images were obtained from the base of the skull through the vertex without intravenous contrast. COMPARISON:  Head CT 05/11/2015 FINDINGS: Brain: There is no mass, hemorrhage or extra-axial collection. There is generalized atrophy without lobar predilection. Areas of hypoattenuation of  the deep gray nuclei and confluent periventricular white matter hypodensity, consistent with chronic small vessel disease. Vascular: Atherosclerotic calcification of the vertebral and internal carotid arteries at the skull base. No abnormal hyperdensity of the major intracranial arteries or dural venous sinuses. Skull: The visualized skull base, calvarium and extracranial soft tissues are normal. Sinuses/Orbits: No fluid levels or advanced mucosal thickening of the visualized paranasal sinuses. No mastoid or middle ear effusion. The orbits are normal. ASPECTS [facility] Stroke Program Early CT Score) - Ganglionic level infarction (caudate, lentiform nuclei, internal capsule, insula, M1-M3 cortex): 7 - Supraganglionic infarction (M4-M6 cortex): 3 Total score (0-10 with 10 being normal): 10 IMPRESSION: 1. No acute hemorrhage or mass effect. 2. ASPECTS is 10. 3. Generalized atrophy and findings of chronic ischemic microangiopathy, including multiple old lacunar infarcts. These results were called by telephone at the time of interpretation on 01/08/2018 at 12:28 am to Dr. Derrill Kay , who verbally acknowledged these results. Electronically Signed   By: Cletus Gash.D.  On: 01/08/2018 00:30    Lab Data:  CBC: Recent Labs  Lab 01/10/18 1802 01/11/18 0218 01/11/18 1749 01/12/18 0315 01/12/18 1511 01/13/18 0353  WBC 20.4* 18.8* 19.0* 17.9*  --  19.8*  NEUTROABS  --   --  16.9* 14.6*  --   --   HGB 8.2* 7.6* 8.2* 7.9* 9.8* 9.7*  HCT 24.5* 22.9* 24.3* 24.4* 30.1* 29.3*  MCV 93.2 94.6 92.7 92.8  --  93.3  PLT 169 160 176 175  --  163   Basic Metabolic Panel: Recent Labs  Lab 01/09/18 0404 01/10/18 0546 01/11/18 0218 01/12/18 0315 01/13/18 0353  NA 135 138 138 139 134*  K 4.8 4.6 4.6 4.7 4.0  CL 91* 99 100 101 94*  CO2 31 31 30 28 31   GLUCOSE 207* 117* 134* 112* 130*  BUN 53* 56* 57* 40* 30*  CREATININE 1.36* 1.26* 1.13 0.93 0.97  CALCIUM 8.0* 7.8* 7.8* 7.7* 8.0*  MG  --  2.1 2.1 1.8  1.6*  PHOS  --  3.5 3.1 2.0* 2.4*   GFR: Estimated Creatinine Clearance: 76.4 mL/min (by C-G formula based on SCr of 0.97 mg/dL). Liver Function Tests: No results for input(s): AST, ALT, ALKPHOS, BILITOT, PROT, ALBUMIN in the last 168 hours. No results for input(s): LIPASE, AMYLASE in the last 168 hours. Recent Labs  Lab 01/08/18 0054  AMMONIA 27   Coagulation Profile: Recent Labs  Lab 01/08/18 0004  INR 1.08   Cardiac Enzymes: Recent Labs  Lab 01/07/18 0514 01/10/18 1405 01/10/18 2013 01/11/18 0218 01/11/18 0822  TROPONINI 0.08* 0.18* 0.12* 0.22* 0.22*   BNP (last 3 results) No results for input(s): PROBNP in the last 8760 hours. HbA1C: No results for input(s): HGBA1C in the last 72 hours. CBG: Recent Labs  Lab 01/11/18 1933 01/11/18 2327 01/12/18 0340 01/12/18 0725 01/12/18 1119  GLUCAP 175* 133* 120* 107* 141*   Lipid Profile: No results for input(s): CHOL, HDL, LDLCALC, TRIG, CHOLHDL, LDLDIRECT in the last 72 hours. Thyroid Function Tests: No results for input(s): TSH, T4TOTAL, FREET4, T3FREE, THYROIDAB in the last 72 hours. Anemia Panel: No results for input(s): VITAMINB12, FOLATE, FERRITIN, TIBC, IRON, RETICCTPCT in the last 72 hours. Urine analysis:    Component Value Date/Time   COLORURINE YELLOW 12/31/2017 2047   APPEARANCEUR CLEAR 12/31/2017 2047   LABSPEC 1.025 12/31/2017 2047   PHURINE 5.0 12/31/2017 2047   GLUCOSEU 250 (A) 12/31/2017 2047   HGBUR MODERATE (A) 12/31/2017 2047   BILIRUBINUR NEGATIVE 12/31/2017 2047   KETONESUR NEGATIVE 12/31/2017 2047   PROTEINUR 100 (A) 12/31/2017 2047   UROBILINOGEN 0.2 08/26/2012 0930   NITRITE NEGATIVE 12/31/2017 2047   LEUKOCYTESUR NEGATIVE 12/31/2017 2047     Ripudeep Rai M.D. [facility] 01/13/2018, 10:50 AM  Pager: [phone] Between 7am to 7pm - call Pager - [phone]  After 7pm go to [email or web address] - password [email or web address]  Call night coverage person covering after 7pm

## 2018-01-13 NOTE — Progress Notes (Addendum)
Progress Note  Patient Name: [name] Date of Encounter: 01/13/2018  Primary Cardiologist: Jim Furbish, MD   Subjective   Patient reports right upper quadrant and epigastric pain that he describes as a "burning" sensation. He denies any real chest pain but states it still hurts when he coughs. Patient also reports feeling anxious.  Inpatient Medications    Scheduled Meds: . atorvastatin  20 mg Oral q1800  . benzonatate  200 mg Oral BID  . budesonide (PULMICORT) nebulizer solution  0.5 mg Nebulization BID  . busPIRone  7.5 mg Oral TID  . dextromethorphan-guaiFENesin  1 tablet Oral BID  . feeding supplement (ENSURE ENLIVE)  237 mL Oral BID BM  . gabapentin  300 mg Oral TID  . levothyroxine  50 mcg Oral Q0600  . nicotine  21 mg Transdermal Daily  . pantoprazole  40 mg Oral QHS  . predniSONE  30 mg Oral Q breakfast   Continuous Infusions: . sodium chloride Stopped (01/12/18 0932)   PRN Meds: sodium chloride, acetaminophen **OR** acetaminophen, ALPRAZolam, alum & mag hydroxide-simeth, bisacodyl, hydrALAZINE, ipratropium-albuterol, ondansetron **OR** ondansetron (ZOFRAN) IV, oxyCODONE-acetaminophen, polyethylene glycol   Vital Signs    Vitals:   01/12/18 1950 01/13/18 0002 01/13/18 0445 01/13/18 0729  BP:  124/72 124/74   Pulse:  75 72   Resp:      Temp:   97.6 F (36.4 C)   TempSrc:   Oral   SpO2: 100% 98% 98% 99%  Weight:   72.1 kg   Height:        Intake/Output Summary (Last 24 hours) at 01/13/2018 0843 Last data filed at 01/13/2018 0600 Gross per 24 hour  Intake 1577.75 ml  Output 2400 ml  Net -822.25 ml   Filed Weights   01/06/18 0617 01/10/18 0430 01/13/18 0445  Weight: 67.1 kg 70.2 kg 72.1 kg    Telemetry    Sinus rhythm with heart rates mostly in the 70's to 90's and multiple PACs. - Personally Reviewed  ECG    No new ECG tracings today. - Personally Reviewed  Physical Exam   GEN: Caucasian male resting comfortably. Alert and in no acute  distress.   Neck: Supple. Cardiac: RRR. No murmurs, rubs, or gallops.  Respiratory: No increased work of breathing. Minimal scattered crackles noted in bilateral bases but lungs relatively clear. GI: Abdomen soft, non-distended, and mildly tender to palpation of RUQ and epigastric area. MS: No lower extremity edema. No deformity. Neuro:  No focal deficits. Psych: Normal affect.  Labs    Chemistry Recent Labs  Lab 01/11/18 0218 01/12/18 0315 01/13/18 0353  NA 138 139 134*  K 4.6 4.7 4.0  CL 100 101 94*  CO2 30 28 31   GLUCOSE 134* 112* 130*  BUN 57* 40* 30*  CREATININE 1.13 0.93 0.97  CALCIUM 7.8* 7.7* 8.0*  GFRNONAA >60 >60 >60  GFRAA >60 >60 >60  ANIONGAP 8 10 9      Hematology Recent Labs  Lab 01/11/18 1749 01/12/18 0315 01/12/18 1511 01/13/18 0353  WBC 19.0* 17.9*  --  19.8*  RBC 2.62* 2.63*  --  3.14*  HGB 8.2* 7.9* 9.8* 9.7*  HCT 24.3* 24.4* 30.1* 29.3*  MCV 92.7 92.8  --  93.3  MCH 31.3 30.0  --  30.9  MCHC 33.7 32.4  --  33.1  RDW 14.6 15.0  --  15.2  PLT 176 175  --  209    Cardiac Enzymes Recent Labs  Lab 01/10/18 1405 01/10/18 2013  01/11/18 0218 01/11/18 0822  TROPONINI 0.18* 0.12* 0.22* 0.22*   No results for input(s): TROPIPOC in the last 168 hours.   BNPNo results for input(s): BNP, PROBNP in the last 168 hours.   DDimer No results for input(s): DDIMER in the last 168 hours.   Radiology    Dg Chest Port 1 View  Result Date: 01/12/2018 CLINICAL DATA:  Abnormal respiration. EXAM: PORTABLE CHEST 1 VIEW COMPARISON:  01/11/2018. FINDINGS: Prior CABG. Heart size stable. No pulmonary venous congestion. Mild left base subsegmental atelectasis. Tiny stable bilateral pleural effusions. No pneumothorax. IMPRESSION: 1.  Prior CABG.  Heart size stable.  No pulmonary venous congestion. 2. Mild left base subsegmental axis. Tiny bilateral pleural effusions again noted. No interim change. Electronically Signed   By: [name]  Register   On: 01/12/2018 06:47      Cardiac Studies   Echocardiogram 01/06/2018: Study Conclusions: - Left ventricle: The cavity size was normal. Wall thickness was   increased in a pattern of moderate LVH. Systolic function was   normal. The estimated ejection fraction was in the range of 55%   to 60%. - Aortic valve: Mildly calcified annulus. Mildly thickened   leaflets. Valve area (VTI): 2.26 cm^2. Valve area (Vmax): 2.26   cm^2. - Mitral valve: Mildly calcified annulus. Mildly thickened leaflets - Technically difficult study.  Patient Profile     [name] a 67 year old malewith a history of CAD (s/p CABG in 2009 with catheterization in 02/2008 showing occluded SVG-PDA and SVG-OM, repeat catheterization in 02/2015 showing patency of LIMA-LAD, SVG-D1, and free RIMA-Ramus and PCI/DES of native RCA), Type B aortic dissection, chronic hypoxic respiratory failure (on 3-4L  at baseline), hyperlipidemia, and orthostatic hypotensionwho was admitted for chest pain and elevated troponin on 01/05/2018. He was started on IV heparin with plans for left heart catheterization. However 01/09/2018, he developed severe back pain, hypotension with SBP 60's and AMS. CT showed a large retroperitoneal bleed (previously known type B aortic dissection was stable).   Assessment & Plan    Chest Pain with Elevated Troponin - Patient presented with worsening dyspnea after having recently been discharged for a COPD exacerbation. - Initial cyclic troponin values were flat at 0.16 >> 0.12 >> 0.19. Follow-up troponin after development of retroperitoneal bleed peaked at 0.22. - EKG on 12/21 showed new T wave inversion in lateral leads.  - Echocardiogram this admission showed LVEF 55-60%. - Initial plans were for LHC; however, this was cancelled due to retroperitoneal bleed. IV Heparin and Aspirin were discontinued. Plan for now is medical therapy.  - Patient reports right upper quadrant and epigastric pain today but denies any real angina.   - Elevated possibly due to COPD exacerbation. He is not a candidate for any interventional cardiology procedure at this time. - Will try to keep Hgb between 9 and 10 to reduce risk of demand ischemia/angina.  Hypovolemic Hemorrhagic Shock Secondary to Retroperitoneal Bleed - BP currently stable at 124/74.  - Hgb stable at 9.7 today. - Patient has received multiple blood transfusions. Received 1 unit of PRBCs yesterday. - If Hgb drops again, may need additional imaging. - Critical Care is managing.   Type B Aortic Dissection - Aortic dissection appears to be chronic and stable. It does not appear to be related to the retroperitoneal bleed.    For questions or updates, please contact [place] Please consult [email or web address] for contact info under        Signed, Darreld Mclean, PA-C  01/13/2018,  8:43 AM     Attending Note:   The patient was seen and examined.  Agree with assessment and plan as noted above.  Changes made to the above note as needed.  Patient seen and independently examined with  Sande Rives, PA .   We discussed all aspects of the encounter. I agree with the assessment and plan as stated above.  1. CP .   Troponin is minimally elevated but the trend is flat. Unable to do a cath at this time given his retroperitoneal bleed.   Follow up with [name] as OP  2.   Aortic dissection type B    Keep BP well controlled.  Add Metoprolol 25 BID  Will want to maximize beta blocker therapy as much as possible    I have spent a total of 40 minutes with patient reviewing hospital  notes , telemetry, EKGs, labs and examining patient as well as establishing an assessment and plan that was discussed with the patient. > 50% of time was spent in direct patient care.    Thayer Headings, Brooke Bonito., MD, [facility] 01/13/2018, 11:01 AM [address]  [place] Pager [phone]

## 2018-01-14 ENCOUNTER — Inpatient Hospital Stay (HOSPITAL_COMMUNITY): Payer: Medicare PPO

## 2018-01-14 DIAGNOSIS — E7800 Pure hypercholesterolemia, unspecified: Secondary | ICD-10-CM

## 2018-01-14 LAB — CBC
HCT: 31.1 % — ABNORMAL LOW (ref 39.0–52.0)
Hemoglobin: 10.3 g/dL — ABNORMAL LOW (ref 13.0–17.0)
MCH: 31.3 pg (ref 26.0–34.0)
MCHC: 33.1 g/dL (ref 30.0–36.0)
MCV: 94.5 fL (ref 80.0–100.0)
PLATELETS: 227 10*3/uL (ref 150–400)
RBC: 3.29 MIL/uL — ABNORMAL LOW (ref 4.22–5.81)
RDW: 15 % (ref 11.5–15.5)
WBC: 21.4 10*3/uL — ABNORMAL HIGH (ref 4.0–10.5)
nRBC: 0 % (ref 0.0–0.2)

## 2018-01-14 LAB — BASIC METABOLIC PANEL
Anion gap: 8 (ref 5–15)
BUN: 24 mg/dL — AB (ref 8–23)
CO2: 31 mmol/L (ref 22–32)
Calcium: 8.1 mg/dL — ABNORMAL LOW (ref 8.9–10.3)
Chloride: 98 mmol/L (ref 98–111)
Creatinine, Ser: 0.98 mg/dL (ref 0.61–1.24)
GFR calc Af Amer: >60 mL/min (ref >60)
Glucose, Bld: 110 mg/dL — ABNORMAL HIGH (ref 70–99)
POTASSIUM: 4.5 mmol/L (ref 3.5–5.1)
Sodium: 137 mmol/L (ref 135–145)

## 2018-01-14 LAB — GLUCOSE, CAPILLARY
Glucose-Capillary: 113 mg/dL — ABNORMAL HIGH (ref 70–99)
Glucose-Capillary: 128 mg/dL — ABNORMAL HIGH (ref 70–99)
Glucose-Capillary: 144 mg/dL — ABNORMAL HIGH (ref 70–99)
Glucose-Capillary: 146 mg/dL — ABNORMAL HIGH (ref 70–99)
Glucose-Capillary: 173 mg/dL — ABNORMAL HIGH (ref 70–99)
Glucose-Capillary: 87 mg/dL (ref 70–99)

## 2018-01-14 MED ORDER — MORPHINE SULFATE (PF) 2 MG/ML IV SOLN
2.0000 mg | Freq: Once | INTRAVENOUS | Status: AC
  Administered 2018-01-14: 2 mg via INTRAVENOUS
  Filled 2018-01-14: qty 1

## 2018-01-14 MED ORDER — GUAIFENESIN-DM 100-10 MG/5ML PO SYRP
5.0000 mL | ORAL_SOLUTION | ORAL | Status: DC | PRN
  Administered 2018-01-14: 5 mL via ORAL
  Filled 2018-01-14: qty 5

## 2018-01-14 MED ORDER — BENZONATATE 100 MG PO CAPS
200.0000 mg | ORAL_CAPSULE | Freq: Three times a day (TID) | ORAL | Status: DC
  Administered 2018-01-14 – 2018-01-18 (×11): 200 mg via ORAL
  Filled 2018-01-14 (×11): qty 2

## 2018-01-14 MED ORDER — OXYCODONE-ACETAMINOPHEN 5-325 MG PO TABS
2.0000 | ORAL_TABLET | Freq: Four times a day (QID) | ORAL | Status: DC | PRN
  Administered 2018-01-15 – 2018-01-18 (×10): 2 via ORAL
  Filled 2018-01-14 (×10): qty 2

## 2018-01-14 MED ORDER — HYDROCOD POLST-CPM POLST ER 10-8 MG/5ML PO SUER
5.0000 mL | Freq: Two times a day (BID) | ORAL | Status: DC | PRN
  Administered 2018-01-15 – 2018-01-17 (×3): 5 mL via ORAL
  Filled 2018-01-14 (×3): qty 5

## 2018-01-14 MED ORDER — LIDOCAINE 5 % EX PTCH
1.0000 | MEDICATED_PATCH | CUTANEOUS | Status: DC
  Administered 2018-01-14 – 2018-01-18 (×5): 1 via TRANSDERMAL
  Filled 2018-01-14 (×5): qty 1

## 2018-01-14 MED ORDER — PREDNISONE 20 MG PO TABS
20.0000 mg | ORAL_TABLET | Freq: Every day | ORAL | Status: DC
  Administered 2018-01-15 – 2018-01-18 (×4): 20 mg via ORAL
  Filled 2018-01-14 (×4): qty 1

## 2018-01-14 NOTE — Progress Notes (Signed)
Progress Note  Patient Name: [name] Date of Encounter: 01/14/2018  Primary Cardiologist: Candee Furbish, MD   Subjective   Chest pain only when coughing.  He has R upper quadrant pain with cough.  Breathing is improving.  Inpatient Medications    Scheduled Meds: . atorvastatin  20 mg Oral q1800  . benzonatate  200 mg Oral BID  . budesonide (PULMICORT) nebulizer solution  0.5 mg Nebulization BID  . busPIRone  7.5 mg Oral TID  . dextromethorphan-guaiFENesin  1 tablet Oral BID  . feeding supplement (ENSURE ENLIVE)  237 mL Oral BID BM  . gabapentin  300 mg Oral TID  . levothyroxine  50 mcg Oral Q0600  . metoprolol tartrate  25 mg Oral BID  . nicotine  21 mg Transdermal Daily  . pantoprazole  40 mg Oral QHS  . predniSONE  30 mg Oral Q breakfast   Continuous Infusions: . sodium chloride Stopped (01/12/18 0932)   PRN Meds: sodium chloride, acetaminophen **OR** acetaminophen, ALPRAZolam, alum & mag hydroxide-simeth, bisacodyl, guaiFENesin-dextromethorphan, hydrALAZINE, ipratropium-albuterol, ondansetron **OR** ondansetron (ZOFRAN) IV, oxyCODONE-acetaminophen, polyethylene glycol, traZODone   Vital Signs    Vitals:   01/13/18 2106 01/13/18 2142 01/14/18 0417 01/14/18 0759  BP:   111/67   Pulse:  74 65   Resp:      Temp:   97.8 F (36.6 C)   TempSrc:   Oral   SpO2: 99%  99% 100%  Weight:   70.3 kg   Height:        Intake/Output Summary (Last 24 hours) at 01/14/2018 0813 Last data filed at 01/14/2018 0723 Gross per 24 hour  Intake 950 ml  Output 2650 ml  Net -1700 ml   Filed Weights   01/10/18 0430 01/13/18 0445 01/14/18 0417  Weight: 70.2 kg 72.1 kg 70.3 kg    Telemetry    Sinus rhythm.  PACs. - Personally Reviewed  ECG    n/a - Personally Reviewed  Physical Exam   VS:  BP 111/67 (BP Location: Right Arm)   Pulse 65   Temp 97.8 F (36.6 C) (Oral)   Resp 19   Ht 5\' 10"  (1.778 m)   Wt 70.3 kg   SpO2 100%   BMI 22.24 kg/m  , BMI Body mass index is  22.24 kg/m. GENERAL:  Chronically ill-appearing.  Frequent cough HEENT: Pupils equal round and reactive, fundi not visualized, oral mucosa unremarkable NECK:  No jugular venous distention, waveform within normal limits, carotid upstroke brisk and symmetric, no bruits LUNGS:  Clear to auscultation bilaterally HEART:  RRR.  PMI not displaced or sustained,S1 and S2 within normal limits, no S3, no S4, no clicks, no rubs, no murmurs ABD:  Flat, positive bowel sounds normal in frequency in pitch, no bruits, no rebound, no guarding, no midline pulsatile mass, no hepatomegaly, no splenomegaly EXT:  2 plus pulses throughout, no edema, no cyanosis no clubbing SKIN:  No rashes no nodules.  Ecchymosis on L flank NEURO:  Cranial nerves II through XII grossly intact, motor grossly intact throughout [facility]:  Cognitively intact, oriented to person place and time   Labs    Chemistry Recent Labs  Lab 01/12/18 0315 01/13/18 0353 01/14/18 0427  NA 139 134* 137  K 4.7 4.0 4.5  CL 101 94* 98  CO2 28 31 31   GLUCOSE 112* 130* 110*  BUN 40* 30* 24*  CREATININE 0.93 0.97 0.98  CALCIUM 7.7* 8.0* 8.1*  GFRNONAA >60 >60 >60  GFRAA >60 >60 >  [place] 10 9 8      Hematology Recent Labs  Lab 01/12/18 0315 01/12/18 1511 01/13/18 0353 01/14/18 0427  WBC 17.9*  --  19.8* 21.4*  RBC 2.63*  --  3.14* 3.29*  HGB 7.9* 9.8* 9.7* 10.3*  HCT 24.4* 30.1* 29.3* 31.1*  MCV 92.8  --  93.3 94.5  MCH 30.0  --  30.9 31.3  MCHC 32.4  --  33.1 33.1  RDW 15.0  --  15.2 15.0  PLT 175  --  209 227    Cardiac Enzymes Recent Labs  Lab 01/10/18 1405 01/10/18 2013 01/11/18 0218 01/11/18 0822  TROPONINI 0.18* 0.12* 0.22* 0.22*   No results for input(s): TROPIPOC in the last 168 hours.   BNPNo results for input(s): BNP, PROBNP in the last 168 hours.   DDimer No results for input(s): DDIMER in the last 168 hours.   Radiology    No results found.  Cardiac Studies   Echocardiogram 01/06/2018: Study  Conclusions: - Left ventricle: The cavity size was normal. Wall thickness was increased in a pattern of moderate LVH. Systolic function was normal. The estimated ejection fraction was in the range of 55% to 60%. - Aortic valve: Mildly calcified annulus. Mildly thickened leaflets. Valve area (VTI): 2.26 cm^2. Valve area (Vmax): 2.26 cm^2. - Mitral valve: Mildly calcified annulus. Mildly thickened leaflets - Technically difficult study.  Patient Profile     67 y.o. male with CAD status post CABG, type B aortic dissection, chronic toxic respiratory failure on 3 to 4 L at baseline, hyperlipidemia, and orthostatic hypotension admitted with NSTEMI and plans for left heart catheterization.  However, he developed a retroperitoneal bleed on 28/41 complicated by hypotension and altered mental status.  This was prior to left heart catheterization.  Assessment & Plan    # Elevated troponin: # CAD: # Hyperlipidemia: S/p CABG in 2009 with catheterization in 02/2008 showing occluded SVG-PDA and SVG-OM, repeat catheterization in 02/2015 showing patency of LIMA-LAD, SVG-D1, and free RIMA-Ramus and PCI/DES of native RCA.  Troponin was elevated but relatively flat.  This occurred in the setting of recent hospitalization for COPD exacerbation.  He is currently chest pain free.  His discomfort now is 2/2 cough.  EKG on 12/21 showed new T wave inversions.  Echo this admission revealed normal systolic function and no wall motion abnormality.  Plans for left heart catheterization were canceled due to his retroperitoneal bleed.  Continue with medical management.  Continue atorvastatin and metoprolol.  Would resume aspirin 81mg  in 1 week.   # Retroperitoneal bleed: Occurred while on IV heparin and before LHC.  Hemoglobin nadir was 7.9.  It is up to 10.3 and stable today.  No plans for left heart catheterization as above.  # Type B Dissection: Stable on CT this admission.   Metoprolol added 12/24.        For questions or updates, please contact [place] Please consult [email or web address] for contact info under        Signed, Skeet Latch, MD  01/14/2018, 8:13 AM

## 2018-01-14 NOTE — Progress Notes (Signed)
[facility]                                                                              Patient Demographics  [name], is a 67 y.o. male, DOB - 1950/11/28, ZSW:[id number]  Admit date - 01/05/2018   Admitting Physician Ejiroghene Arlyce Dice, MD  Outpatient Primary MD for the patient is Shelbie Ammons, MD  Outpatient specialists:   LOS - 8  days   Medical records reviewed and are as summarized below:    Chief Complaint  Patient presents with  . Shortness of Breath       Brief summary   67yo male with hx chronic hypoxic respiratory failure on 3-4L home O2 r/t COPD, CAD s/p CABG 2009 with known severe 3V disease on cath 2017, HTN, chronic type B aortic dissection with recent admission for AECOPD, re-admitted 12/16 to [facility] with chest pain and SOB.  W/u neg for PE but did reveal elevated troponin (0.16, 0.12, 0.19) and some EKG changes and was therefore seen by cardiology, started on heparin gtt and ASA and tx to [facility] for tentative cardiac cath.  On 12/20 developed severe back pain, hypotension with SBP 60's and AMS and CT abd revealed large retroperitoneal bleed.  Patient was transferred to ICU under PCCM care Patient transferred back to floor, [place] service assumed care on 12/24 12/26- Admit to [facility] 12/16 with NSTEMI 12/20- To ICU with RP hemorrhage, hypotension  Assessment & Plan    Hemorrhagic shock with hypotension in the setting of retroperitoneal/ chest wall bleed, acute blood loss anemia -Goal hemoglobin 9-10 with NSTEMI, has received RBC transfusions -H&H currently stable and improving however patient continues to complain of abdominal pain, repeat CT abdomen without contrast  NSTEMI -Patient had initially presented with worsening dyspnea, elevated troponins, new T wave inversions in lateral leads -2D echo showed EF of 55 to 60%, patient was started on heparin drip. -Initial plans were for left heart cath however patient developed retroperitoneal  bleed, IV heparin and aspirin were discontinued.  Cardiology now recommends medical therapy not a candidate for interventional procedures. -Hemoglobin goal between 9 and 10.  Acute on chronic hypoxic respiratory failure with COPD exacerbation -Currently improving, continue O2, bronchodilators, prednisone with slow taper -Placed on PPI  Leukocytosis Possibly due to #1 and steroids.,  No fevers or chills or infectious etiology.  Chronic low back pain -Continue Percocet as needed  Known chronic type B aortic dissection chronic and stable, cardiology following  Essential hypertension BP currently controlled   Code Status: Full CODE STATUS DVT Prophylaxis:  SCD's Family Communication: Discussed in detail with the patient, all imaging results, lab results explained to the patient    Disposition Plan: High risk of deterioration, continue inpatient  Time Spent in minutes 25 minutes  Procedures:  CT angios chest abdomen and pelvis  Consultants:   Cardiology  Antimicrobials:      Medications  Scheduled Meds: . atorvastatin  20 mg Oral q1800  . benzonatate  200 mg Oral BID  . budesonide (PULMICORT) nebulizer solution  0.5 mg Nebulization BID  . busPIRone  7.5 mg Oral TID  .  dextromethorphan-guaiFENesin  1 tablet Oral BID  . feeding supplement (ENSURE ENLIVE)  237 mL Oral BID BM  . gabapentin  300 mg Oral TID  . levothyroxine  50 mcg Oral Q0600  . lidocaine  1 patch Transdermal Q24H  . metoprolol tartrate  25 mg Oral BID  . nicotine  21 mg Transdermal Daily  . pantoprazole  40 mg Oral QHS  . predniSONE  30 mg Oral Q breakfast   Continuous Infusions: . sodium chloride Stopped (01/12/18 0932)   PRN Meds:.sodium chloride, acetaminophen **OR** acetaminophen, ALPRAZolam, alum & mag hydroxide-simeth, bisacodyl, guaiFENesin-dextromethorphan, hydrALAZINE, ipratropium-albuterol, ondansetron **OR** ondansetron (ZOFRAN) IV, oxyCODONE-acetaminophen, polyethylene glycol,  traZODone   Antibiotics   Anti-infectives (From admission, onward)   None        Subjective:   [name] was seen and examined today.  Complaining of right upper quadrant abdominal pain, no chest pain or shortness of breath.  Patient denies any nausea or vomiting.  No fevers  Objective:   Vitals:   01/13/18 2106 01/13/18 2142 01/14/18 0417 01/14/18 0759  BP:   111/67   Pulse:  74 65   Resp:      Temp:   97.8 F (36.6 C)   TempSrc:   Oral   SpO2: 99%  99% 100%  Weight:   70.3 kg   Height:        Intake/Output Summary (Last 24 hours) at 01/14/2018 1304 Last data filed at 01/14/2018 1200 Gross per 24 hour  Intake 590 ml  Output 2300 ml  Net -1710 ml     Wt Readings from Last 3 Encounters:  01/14/18 70.3 kg  12/31/17 74.8 kg  05/27/17 65.8 kg   Physical Exam  General: Alert and oriented x 3, NAD  Eyes:   HEENT:   Cardiovascular: S1 S2 auscultated, Regular rate and rhythm. No pedal edema b/l  Respiratory: Decreased breath sound at the bases  Gastrointestinal: Soft, TTP RUQ and epigastric region, ND, NBS   Ext: no pedal edema bilaterally  Neuro:   Musculoskeletal: No digital cyanosis, clubbing  Skin: No rashes  Psych: Normal affect and demeanor, alert and oriented x3       Data Reviewed:  I have personally reviewed following labs and imaging studies  Micro Results Recent Results (from the past 240 hour(s))  MRSA PCR Screening     Status: None   Collection Time: 01/05/18  7:53 PM  Result Value Ref Range Status   MRSA by PCR NEGATIVE NEGATIVE Final    Comment:        The GeneXpert MRSA Assay (FDA approved for NASAL specimens only), is one component of a comprehensive MRSA colonization surveillance program. It is not intended to diagnose MRSA infection nor to guide or monitor treatment for MRSA infections. Performed at [facility], [address], [place], [place] [address]     Radiology Reports Ct Angio Chest Pe W And/or Wo  Contrast  Result Date: 01/05/2018 CLINICAL DATA:  Increased shortness of breath since discharge from the hospital on 01/04/2018. EXAM: CT ANGIOGRAPHY CHEST WITH CONTRAST TECHNIQUE: Multidetector CT imaging of the chest was performed using the standard protocol during bolus administration of intravenous contrast. Multiplanar CT image reconstructions and MIPs were obtained to evaluate the vascular anatomy. CONTRAST:  149mL ISOVUE-370 IOPAMIDOL (ISOVUE-370) INJECTION 76% COMPARISON:  Formal chest obtained earlier today and chest CTA dated 04/03/2015. FINDINGS: Cardiovascular: Previously demonstrated Stanford type B dissection of the aorta beginning just beyond the origin of the left subclavian artery and  extending into the upper abdomen to the level of the origin of the renal arteries with extension into the proximal left renal artery, causing approximately 90+% stenosis of the proximal left renal artery. There has been interval thrombosis of the false lumen of the dissection with the exception of a small amount of persistent opacified blood in the aortic arch superiorly on the left. No significant narrowing of the true lumen of the aorta is seen. Stable post CABG changes. Atheromatous calcifications, including the coronary arteries and aorta. Mediastinum/Nodes: No enlarged mediastinal, hilar, or axillary lymph nodes. Thyroid gland, trachea, and esophagus demonstrate no significant findings. Lungs/Pleura: The lungs remain mildly hyperexpanded with mild bullous changes. Minimal residual atelectasis or scarring at the right lung base posteriorly. No pleural fluid. Upper Abdomen: Aortic dissection extending into the proximal left renal artery, as described above. Musculoskeletal: Motion artifacts making it difficult to assess for rib and sternal fractures. Thoracic spine degenerative changes and mild scoliosis. Lower cervical spine degenerative changes. Review of the MIP images confirms the above findings. IMPRESSION:  1. 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 2. Interval thrombosis of the false lumen of the dissection with the exception of a small amount of persistent opacified blood in the aortic arch superiorly on the left. 3. Mild changes of COPD. 4. Calcific coronary artery and aortic atherosclerosis and post CABG changes. Aortic Atherosclerosis (ICD10-I70.0) and Emphysema (ICD10-J43.9). Electronically Signed   By: Claudie Revering M.D.   On: 01/05/2018 15:40   Dg Chest Port 1 View  Result Date: 01/12/2018 CLINICAL DATA:  Abnormal respiration. EXAM: PORTABLE CHEST 1 VIEW COMPARISON:  01/11/2018. FINDINGS: Prior CABG. Heart size stable. No pulmonary venous congestion. Mild left base subsegmental atelectasis. Tiny stable bilateral pleural effusions. No pneumothorax. IMPRESSION: 1.  Prior CABG.  Heart size stable.  No pulmonary venous congestion. 2. Mild left base subsegmental axis. Tiny bilateral pleural effusions again noted. No interim change. Electronically Signed   By: Marcello Moores  Register   On: 01/12/2018 06:47   Dg Chest Port 1 View  Result Date: 01/11/2018 CLINICAL DATA:  Acute respiratory failure EXAM: PORTABLE CHEST 1 VIEW COMPARISON:  01/05/2018 FINDINGS: Postop CABG. Negative for heart failure or edema. Improvement in by small bilateral pleural effusions. Negative for pneumonia. IMPRESSION: Lungs remain clear without edema or pneumonia. Improvement in small bilateral effusions. Electronically Signed   By: Franchot Gallo M.D.   On: 01/11/2018 07:04   Dg Chest Port 1 View  Result Date: 01/05/2018 CLINICAL DATA:  PT was just d/c yesterday afternoon for acute on chronic copd. became short of breath shortly after leaving yesterday. Hx pneumonia/htn/ex smoker/hx heart cath EXAM:  PORTABLE CHEST 1 VIEW COMPARISON:  CT chest 04/03/2015 FINDINGS: The lungs are hyperinflated likely secondary to COPD. There is mild bilateral interstitial thickening likely chronic. There is no pleural effusion or pneumothorax. The heart and mediastinal contours are unremarkable. There is dilatation of the aortic arch. The osseous structures are unremarkable. IMPRESSION: No active disease. Dilatation of the aortic arch. Electronically Signed   By: Kathreen Devoid   On: 01/05/2018 12:20   Dg Chest Port 1 View  Result Date: 12/31/2017 CLINICAL DATA:  Shortness of breath EXAM: PORTABLE CHEST 1 VIEW COMPARISON:  March 17, 2017 FINDINGS: Lungs are somewhat hyperexpanded. There is chronic blunting of the left  costophrenic angle. There is no frank edema or consolidation. Heart size and pulmonary vascularity are normal. No adenopathy. Aorta is mildly prominent but stable. No adenopathy. There is evidence of old trauma involving each lateral clavicle. IMPRESSION: Lungs hyperexpanded with chronic blunting of the left costophrenic angle. No edema or consolidation. Stable cardiac silhouette. Aortic prominence is likely indicative of chronic hypertensive change. Electronically Signed   By: Lowella Grip III M.D.   On: 12/31/2017 12:39   Ct Angio Chest/abd/pel For Dissection W And/or W/wo  Result Date: 01/09/2018 CLINICAL DATA:  Acute drop in hemoglobin over the last 2 days with severe back pain and known chronic type B dissection involving the aortic arch, descending thoracic aorta and abdominal aorta. EXAM: CT ANGIOGRAPHY CHEST, ABDOMEN AND PELVIS TECHNIQUE: Multidetector CT imaging through the chest, abdomen and pelvis was performed using the standard protocol during bolus administration of intravenous contrast. Multiplanar reconstructed images and MIPs were obtained and reviewed to evaluate the vascular anatomy. CONTRAST:  178mL ISOVUE-370 IOPAMIDOL (ISOVUE-370) INJECTION 76% COMPARISON:  Prior CTA of the chest  on 01/05/2018. CTA of the chest, abdomen and pelvis on 04/24/2017. FINDINGS: CTA CHEST FINDINGS Cardiovascular: Stable appearance of chronic dissection beginning at the origin of the left subclavian artery and extending down the descending thoracic aorta. Maximum diameter of the distal arch is 4.5 cm which is stable. Maximum diameter of the descending thoracic aorta is approximately 4 cm and is stable. Proximal great vessels show stable and normal patency. There is stable irregular outpouching of the true lumen into the false lumen at the top of the aortic arch measuring roughly 2.7 cm in greatest diameter. No evidence of acute hemorrhage in the chest. The heart size is stable. No pericardial fluid identified. The ascending thoracic aorta is normal in caliber and shows no evidence of dissection. Central pulmonary arteries are normal in caliber and well opacified demonstrating normal patency. Mediastinum/Nodes: No enlarged mediastinal, hilar, or axillary lymph nodes. Thyroid gland, trachea, and esophagus demonstrate no significant findings. Lungs/Pleura: Stable emphysematous lung disease. There is no evidence of pulmonary edema, consolidation, pneumothorax, nodule or pleural fluid. Musculoskeletal: Beginning in the lower right chest wall, there is evidence of hemorrhage anteriorly continuing into the upper abdominal wall. This is further described in the CTA abdomen report below. Review of the MIP images confirms the above findings. CTA ABDOMEN AND PELVIS FINDINGS VASCULAR Aorta: Stable evidence of dissection extending down the abdominal aorta and into the proximal right common iliac artery. Stable narrowing of the proximal right common iliac artery. Stable aneurysmal dilatation of the abdominal aorta measuring approximately 4.6 x 4.7 cm. No evidence of aneurysm rupture. Celiac: . small caliber celiac trunk emanating off of the true lumen and otherwise demonstrating normal patency. SMA: SMA originates off of the true  lumen and demonstrates normal patency. Renals: Bilateral renal arteries emanate off of the true lumen and demonstrate normal patency. 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 Inflow: Bilateral iliac artery patency stable with stable stenosis of the proximal right common iliac artery of approximately 60-70%. Stable small caliber bilateral external iliac arteries. Review of the MIP images confirms the above findings. NON-VASCULAR Hepatobiliary: No focal liver abnormality is seen. No gallstones, gallbladder wall thickening, or biliary dilatation. Pancreas: Unremarkable. No pancreatic ductal dilatation or surrounding inflammatory changes. Spleen: Normal in size without focal abnormality. Adrenals/Urinary Tract: Adrenal glands are unremarkable. Kidneys are normal, without renal calculi, focal lesion, or hydronephrosis. Bladder is unremarkable. Stomach/Bowel: No bowel obstruction, ileus or free air. No phlegm a  tori process or mass identified. Lymphatic: No enlarged lymph nodes identified. Reproductive: Prostate is unremarkable. Other: There are 2 separate areas of large spontaneous hemorrhage. Large hematoma of the lower anterior right chest wall extends into the upper right rectus sheath. Largest dimensions of the area of hemorrhage are roughly 14.5 x 6.0 x 12.6 cm. There also is spontaneous hemorrhage in the right retroperitoneum involving the psoas muscle and extending laterally into the retroperitoneal space. Intramuscular psoas hemorrhage extends into the pelvis. Largest dimensions of the right retroperitoneal bleed are roughly 8.2 x 6.4 x 16 cm. Musculoskeletal: No acute or significant osseous findings. Review of the MIP images confirms the above findings. IMPRESSION: 1. No change in appearance of the chronic type B aortic dissection involving the distal arch, descending thoracic aorta and abdominal aorta. No evidence to suggest rupture or hemorrhage emanating from the aorta. 2. New spontaneous large  hemorrhages involving the lower right anterior chest wall extending into the upper right rectus abdominal musculature and the right retroperitoneum involving the right iliopsoas muscle extending into the pelvis. 3. These results were called by telephone at the time of interpretation on 01/09/2018 at 11:15 am to Dr. Buford Dresser , who verbally acknowledged these results. Electronically Signed   By: Aletta Edouard M.D.   On: 01/09/2018 11:30   Ct Head Code Stroke Wo Contrast  Result Date: 01/08/2018 CLINICAL DATA:  Code stroke. EXAM: CT HEAD WITHOUT CONTRAST TECHNIQUE: Contiguous axial images were obtained from the base of the skull through the vertex without intravenous contrast. COMPARISON:  Head CT 05/11/2015 FINDINGS: Brain: There is no mass, hemorrhage or extra-axial collection. There is generalized atrophy without lobar predilection. Areas of hypoattenuation of the deep gray nuclei and confluent periventricular white matter hypodensity, consistent with chronic small vessel disease. Vascular: Atherosclerotic calcification of the vertebral and internal carotid arteries at the skull base. No abnormal hyperdensity of the major intracranial arteries or dural venous sinuses. Skull: The visualized skull base, calvarium and extracranial soft tissues are normal. Sinuses/Orbits: No fluid levels or advanced mucosal thickening of the visualized paranasal sinuses. No mastoid or middle ear effusion. The orbits are normal. ASPECTS [facility] Stroke Program Early CT Score) - Ganglionic level infarction (caudate, lentiform nuclei, internal capsule, insula, M1-M3 cortex): 7 - Supraganglionic infarction (M4-M6 cortex): 3 Total score (0-10 with 10 being normal): 10 IMPRESSION: 1. CLINICAL DATA:  Code stroke. EXAM: CT HEAD WITHOUT CONTRAST TECHNIQUE: Contiguous axial images were obtained from the base of the skull through the vertex without intravenous contrast. COMPARISON:  Head CT 05/11/2015 FINDINGS: Brain: There is  no mass, hemorrhage or extra-axial collection. There is generalized atrophy without lobar predilection. Areas of hypoattenuation of the deep gray nuclei and confluent periventricular white matter hypodensity, consistent with chronic small vessel disease. Vascular: Atherosclerotic calcification of the vertebral and internal carotid arteries at the skull base. No abnormal hyperdensity of the major intracranial arteries or dural venous sinuses. Skull: The visualized skull base, calvarium and extracranial soft tissues are normal. Sinuses/Orbits: No fluid levels or advanced mucosal thickening of the visualized paranasal sinuses. No mastoid or middle ear effusion. The orbits are normal. ASPECTS [facility] Stroke Program Early CT Score) - Ganglionic level infarction (caudate, lentiform nuclei, internal capsule, insula, M1-M3 cortex): 7 - Supraganglionic infarction (M4-M6 cortex): 3 Total score (0-10 with 10 being normal): 10 IMPRESSION: 1. No acute hemorrhage or mass effect. 2. ASPECTS is 10. 3. Generalized atrophy and findings of chronic ischemic microangiopathy, including multiple old lacunar infarcts. These results were called by telephone  at the time of interpretation on 01/08/2018 at 12:28 am to Dr. Derrill Kay , who verbally acknowledged these results. Electronically Signed   By: Ulyses Jarred M.D.   On: 01/08/2018 00:30    Lab Data:  CBC: Recent Labs  Lab 01/11/18 0218 01/11/18 1749 01/12/18 0315 01/12/18 1511 01/13/18 0353 01/14/18 0427  WBC 18.8* 19.0* 17.9*  --  19.8* 21.4*  NEUTROABS  --  16.9* 14.6*  --   --   --   HGB 7.6* 8.2* 7.9* 9.8* 9.7* 10.3*  HCT 22.9* 24.3* 24.4* 30.1* 29.3* 31.1*  MCV 94.6 92.7 92.8  --  93.3 94.5  PLT 160 176 175  --  209 462   Basic Metabolic Panel: Recent Labs  Lab 01/10/18 0546 01/11/18 0218 01/12/18 0315 01/13/18 0353 01/14/18 0427  NA 138 138 139 134* 137  K 4.6 4.6 4.7 4.0 4.5  CL 99 100 101 94* 98  CO2 31 30 28 31 31   GLUCOSE 117* 134* 112* 130*  110*  BUN 56* 57* 40* 30* 24*  CREATININE 1.26* 1.13 0.93 0.97 0.98  CALCIUM 7.8* 7.8* 7.7* 8.0* 8.1*  MG 2.1 2.1 1.8 1.6*  --   PHOS 3.5 3.1 2.0* 2.4*  --    GFR: Estimated Creatinine Clearance: 73.7 mL/min (by C-G formula based on SCr of 0.98 mg/dL). Liver Function Tests: No results for input(s): AST, ALT, ALKPHOS, BILITOT, PROT, ALBUMIN in the last 168 hours. No results for input(s): LIPASE, AMYLASE in the last 168 hours. Recent Labs  Lab 01/08/18 0054  AMMONIA 27   Coagulation Profile: Recent Labs  Lab 01/08/18 0004  INR 1.08   Cardiac Enzymes: Recent Labs  Lab 01/10/18 1405 01/10/18 2013 01/11/18 0218 01/11/18 0822  TROPONINI 0.18* 0.12* 0.22* 0.22*   BNP (last 3 results) No results for input(s): PROBNP in the last 8760 hours. HbA1C: No results for input(s): HGBA1C in the last 72 hours. CBG: Recent Labs  Lab 01/13/18 2028 01/14/18 0002 01/14/18 0414 01/14/18 0742 01/14/18 1157  GLUCAP 162* 173* 113* 87 128*   Lipid Profile: No results for input(s): CHOL, HDL, LDLCALC, TRIG, CHOLHDL, LDLDIRECT in the last 72 hours. Thyroid Function Tests: No results for input(s): TSH, T4TOTAL, FREET4, T3FREE, THYROIDAB in the last 72 hours. Anemia Panel: No results for input(s): VITAMINB12, FOLATE, FERRITIN, TIBC, IRON, RETICCTPCT in the last 72 hours. Urine analysis:    Component Value Date/Time   COLORURINE YELLOW 12/31/2017 2047   APPEARANCEUR CLEAR 12/31/2017 2047   LABSPEC 1.025 12/31/2017 2047   PHURINE 5.0 12/31/2017 2047   GLUCOSEU 250 (A) 12/31/2017 2047   HGBUR MODERATE (A) 12/31/2017 2047   BILIRUBINUR NEGATIVE 12/31/2017 2047   KETONESUR NEGATIVE 12/31/2017 2047   PROTEINUR 100 (A) 12/31/2017 2047   UROBILINOGEN 0.2 08/26/2012 0930   NITRITE NEGATIVE 12/31/2017 2047   LEUKOCYTESUR NEGATIVE 12/31/2017 2047     Ripudeep Rai M.D. [facility] 01/14/2018, 1:04 PM  Pager: [phone] Between 7am to 7pm - call Pager - [phone]  After 7pm go  to [email or web address] - password [email or web address]  Call night coverage person covering after 7pm

## 2018-01-15 LAB — BASIC METABOLIC PANEL
Anion gap: 11 (ref 5–15)
BUN: 26 mg/dL — ABNORMAL HIGH (ref 8–23)
CO2: 26 mmol/L (ref 22–32)
Calcium: 8.1 mg/dL — ABNORMAL LOW (ref 8.9–10.3)
Chloride: 97 mmol/L — ABNORMAL LOW (ref 98–111)
Creatinine, Ser: 0.93 mg/dL (ref 0.61–1.24)
GFR calc Af Amer: >60 mL/min (ref >60)
GFR calc non Af Amer: >60 mL/min (ref >60)
Glucose, Bld: 107 mg/dL — ABNORMAL HIGH (ref 70–99)
Potassium: 4.3 mmol/L (ref 3.5–5.1)
Sodium: 134 mmol/L — ABNORMAL LOW (ref 135–145)

## 2018-01-15 LAB — CBC
HCT: 30.3 % — ABNORMAL LOW (ref 39.0–52.0)
Hemoglobin: 9.9 g/dL — ABNORMAL LOW (ref 13.0–17.0)
MCH: 30.7 pg (ref 26.0–34.0)
MCHC: 32.7 g/dL (ref 30.0–36.0)
MCV: 94.1 fL (ref 80.0–100.0)
Platelets: 221 10*3/uL (ref 150–400)
RBC: 3.22 MIL/uL — ABNORMAL LOW (ref 4.22–5.81)
RDW: 14.8 % (ref 11.5–15.5)
WBC: 19.4 10*3/uL — ABNORMAL HIGH (ref 4.0–10.5)
nRBC: 0 % (ref 0.0–0.2)

## 2018-01-15 LAB — HEPATIC FUNCTION PANEL
ALT: 26 U/L (ref 0–44)
AST: 31 U/L (ref 15–41)
Albumin: 2.5 g/dL — ABNORMAL LOW (ref 3.5–5.0)
Alkaline Phosphatase: 35 U/L — ABNORMAL LOW (ref 38–126)
Bilirubin, Direct: 0.2 mg/dL (ref 0.0–0.2)
Indirect Bilirubin: 0.7 mg/dL (ref 0.3–0.9)
Total Bilirubin: 0.9 mg/dL (ref 0.3–1.2)
Total Protein: 4.8 g/dL — ABNORMAL LOW (ref 6.5–8.1)

## 2018-01-15 LAB — GLUCOSE, CAPILLARY
Glucose-Capillary: 100 mg/dL — ABNORMAL HIGH (ref 70–99)
Glucose-Capillary: 132 mg/dL — ABNORMAL HIGH (ref 70–99)
Glucose-Capillary: 147 mg/dL — ABNORMAL HIGH (ref 70–99)
Glucose-Capillary: 159 mg/dL — ABNORMAL HIGH (ref 70–99)

## 2018-01-15 NOTE — Progress Notes (Signed)
[facility]                                                                              Patient Demographics  [name], is a 67 y.o. male, DOB - 07-21-1950, YIF:[id number]  Admit date - 01/05/2018   Admitting Physician Ejiroghene Arlyce Dice, MD  Outpatient Primary MD for the patient is Shelbie Ammons, MD  Outpatient specialists:   LOS - 9  days   Medical records reviewed and are as summarized below:    Chief Complaint  Patient presents with  . Shortness of Breath       Brief summary   67yo male with hx chronic hypoxic respiratory failure on 3-4L home O2 r/t COPD, CAD s/p CABG 2009 with known severe 3V disease on cath 2017, HTN, chronic type B aortic dissection with recent admission for AECOPD, re-admitted 12/16 to [facility] with chest pain and SOB.  W/u neg for PE but did reveal elevated troponin (0.16, 0.12, 0.19) and some EKG changes and was therefore seen by cardiology, started on heparin gtt and ASA and tx to [facility] for tentative cardiac cath.  On 12/20 developed severe back pain, hypotension with SBP 60's and AMS and CT abd revealed large retroperitoneal bleed.  Patient was transferred to ICU under PCCM care Patient transferred back to floor, [place] service assumed care on 12/24 12/26- Admit to [facility] 12/16 with NSTEMI 12/20- To ICU with RP hemorrhage, hypotension  Assessment & Plan    Hemorrhagic shock with hypotension in the setting of retroperitoneal/ chest wall bleed, acute blood loss anemia -Goal hemoglobin 9-10 with NSTEMI, has received RBC transfusions -H&H stable, repeat CT abdomen pelvis showed significant reduction in the hematomas involving the right lower anterior chest wall extending into the right rectus sheath and the posterior right retroperitoneum, no new hemorrhage. -Stable, start PT today  NSTEMI -Patient had initially presented with worsening dyspnea, elevated troponins, new T wave inversions in lateral leads -2D echo showed EF of 55  to 60%, patient was started on heparin drip. -Initial plans were for left heart cath however patient developed retroperitoneal bleed, IV heparin and aspirin were discontinued.  Cardiology now recommends medical therapy not a candidate for interventional procedures. -Hemoglobin goal between 9 and 10.  Acute on chronic hypoxic respiratory failure with COPD exacerbation -Slowly improving, O2 sats 98% on 3 L currently, wean O2 as tolerated  -Continue O2, bronchodilators, prednisone, PPI   Leukocytosis Possibly due to #1 and steroids.,  No fevers or chills or infectious etiology.  Chronic low back pain -Continue Percocet as needed  Known chronic type B aortic dissection chronic and stable, cardiology following.  BP control, continue Lopressor 25 mg twice a day  Essential hypertension BP currently controlled   Code Status: Full CODE STATUS DVT Prophylaxis:  SCD's Family Communication: Discussed in detail with the patient, all imaging results, lab results explained to the patient    Disposition Plan: Slowly improving, start PT today, overwhelmed about prospects of going home and " something happens" in light of chronic aortic dissection  Time Spent in minutes 25 minutes  Procedures:  CT angio chest abdomen and pelvis  Consultants:   Cardiology  Antimicrobials:      Medications  Scheduled Meds: . atorvastatin  20 mg Oral q1800  . benzonatate  200 mg Oral TID  . budesonide (PULMICORT) nebulizer solution  0.5 mg Nebulization BID  . busPIRone  7.5 mg Oral TID  . dextromethorphan-guaiFENesin  1 tablet Oral BID  . feeding supplement (ENSURE ENLIVE)  237 mL Oral BID BM  . gabapentin  300 mg Oral TID  . levothyroxine  50 mcg Oral Q0600  . lidocaine  1 patch Transdermal Q24H  . metoprolol tartrate  25 mg Oral BID  . nicotine  21 mg Transdermal Daily  . pantoprazole  40 mg Oral QHS  . predniSONE  20 mg Oral Q breakfast   Continuous Infusions: . sodium chloride Stopped  (01/12/18 0932)   PRN Meds:.sodium chloride, acetaminophen **OR** acetaminophen, ALPRAZolam, alum & mag hydroxide-simeth, bisacodyl, chlorpheniramine-HYDROcodone, hydrALAZINE, ipratropium-albuterol, ondansetron **OR** ondansetron (ZOFRAN) IV, oxyCODONE-acetaminophen, polyethylene glycol, traZODone   Antibiotics   Anti-infectives (From admission, onward)   None        Subjective:   [name] was seen and examined today.  States abdominal pain 6/10, cough feels better with antitussives.  No chest pain or shortness of breath, nausea or vomiting, fevers.   Objective:   Vitals:   01/14/18 2126 01/15/18 0412 01/15/18 0724 01/15/18 0747  BP:  (!) 147/95 111/61 111/61  Pulse: 79 81 63 67  Resp:  20  16  Temp:  97.8 F (36.6 C) 98 F (36.7 C)   TempSrc:  Oral Oral   SpO2:  100% 100% 98%  Weight:  69.9 kg    Height:        Intake/Output Summary (Last 24 hours) at 01/15/2018 1528 Last data filed at 01/15/2018 1000 Gross per 24 hour  Intake 540 ml  Output 875 ml  Net -335 ml     Wt Readings from Last 3 Encounters:  01/15/18 69.9 kg  12/31/17 74.8 kg  05/27/17 65.8 kg     Physical Exam  General: Alert and oriented x 3, NAD  Eyes:   HEENT:    Cardiovascular: S1 S2 clear, no murmurs, RRR. No pedal edema b/l  Respiratory: Bilateral expiratory wheezing  Gastrointestinal: Mild diffuse tenderness, ND, NBS  Ext: no pedal edema bilaterally  Neuro: no new deficits  Musculoskeletal: No cyanosis, clubbing  Skin: No rashes  Psych: Normal affect and demeanor, alert and oriented x3         Data Reviewed:  I have personally reviewed following labs and imaging studies  Micro Results Recent Results (from the past 240 hour(s))  MRSA PCR Screening     Status: None   Collection Time: 01/05/18  7:53 PM  Result Value Ref Range Status   MRSA by PCR NEGATIVE NEGATIVE Final    Comment:        The GeneXpert MRSA Assay (FDA approved for NASAL specimens only), is one  component of a comprehensive MRSA colonization surveillance program. It is not intended to diagnose MRSA infection nor to guide or monitor treatment for MRSA infections. Performed at [facility], [address], [place], [place] [address]     Radiology Reports Ct Abdomen Pelvis Wo Contrast  Result Date: 01/14/2018 CLINICAL DATA:  Recent chest wall/abdominal wall and retroperitoneal hemorrhage. Worsening abdominal pain. EXAM: CT ABDOMEN AND PELVIS WITHOUT CONTRAST TECHNIQUE: Multidetector CT imaging of the abdomen and pelvis was performed following the standard protocol without IV contrast. COMPARISON:  CTA of the chest, abdomen and  pelvis on 01/09/2018 FINDINGS: Lower chest: The hemorrhage involving the right anterior lower chest wall and extending into the upper right rectus sheath shows substantial improvement since the prior CT. Length of area of residual hemorrhage measures approximately 12 cm and the hematoma now measures only 3 cm in thickness compared to approximately 6 cm previously. Component extending into the right rectus sheath has nearly completely resolved. There is a new small right pleural effusion. Hepatobiliary: No focal liver abnormality is seen. No gallstones, gallbladder wall thickening, or biliary dilatation. Pancreas: Unremarkable. No pancreatic ductal dilatation or surrounding inflammatory changes. Spleen: Normal in size without focal abnormality. Adrenals/Urinary Tract: No hydronephrosis. Continued anterior displacement of right kidney due to posterior right retroperitoneal hemorrhage. Stomach/Bowel: No evidence of bowel obstruction, ileus or free air. Vascular/Lymphatic: No enlarged lymph nodes identified. The right posterior retroperitoneal hemorrhage abutting the psoas muscle shows decrease in size and now measures approximately 6.5 cm in greatest diameter compared to just over 8 cm. There also is less hemorrhage tracking along the ileus psoas muscle into the right pelvis.  No new hemorrhage identified. Stable abdominal aortic aneurysm measuring approximately 4.7 cm in greatest diameter. Reproductive: Prostate is unremarkable. Other: No hernias identified. Musculoskeletal: No acute or significant osseous findings. IMPRESSION: Significant reduction in hematomas involving the right lower anterior chest wall extending into the right rectus sheath and the posterior right retroperitoneum. Both areas of hemorrhage show significant decrease in size, especially the anterior chest/abdominal wall hemorrhage. No new hemorrhage identified. Electronically Signed   By: Aletta Edouard M.D.   On: 01/14/2018 14:08   Ct Angio Chest Pe W And/or Wo Contrast  Result Date: 01/05/2018 CLINICAL DATA:  Increased shortness of breath since discharge from the hospital on 01/04/2018. EXAM: CT ANGIOGRAPHY CHEST WITH CONTRAST TECHNIQUE: Multidetector CT imaging of the chest was performed using the standard protocol during bolus administration of intravenous contrast. Multiplanar CT image reconstructions and MIPs were obtained to evaluate the vascular anatomy. CONTRAST:  11mL ISOVUE-370 IOPAMIDOL (ISOVUE-370) INJECTION 76% COMPARISON:  Formal chest obtained earlier today and chest CTA dated 04/03/2015. 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 There has been interval thrombosis of the false lumen of the dissection with the exception of a small amount of persistent opacified blood in the aortic arch superiorly on the left. No significant narrowing of the true lumen of the aorta is seen. Stable post CABG changes. Atheromatous calcifications, including the coronary arteries and aorta.  Mediastinum/Nodes: No enlarged mediastinal, hilar, or axillary lymph nodes. Thyroid gland, trachea, and esophagus demonstrate no significant findings. Lungs/Pleura: The lungs remain mildly hyperexpanded with mild bullous changes. Minimal residual atelectasis or scarring at the right lung base posteriorly. No pleural fluid. Upper Abdomen: Aortic dissection extending into the proximal left renal artery, as described above. Musculoskeletal: Motion artifacts making it difficult to assess for rib and sternal fractures. Thoracic spine degenerative changes and mild scoliosis. Lower cervical spine degenerative changes. Review of the MIP images confirms the above findings. IMPRESSION: 1. 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  2. Interval thrombosis of the false lumen of the dissection with the exception of a small amount of persistent opacified blood in the aortic arch superiorly on the left. 3. Mild changes of COPD. 4. Calcific coronary artery and aortic atherosclerosis and post CABG changes. Aortic Atherosclerosis (ICD10-I70.0) and Emphysema (ICD10-J43.9). Electronically Signed   By: Claudie Revering M.D.   On: 01/05/2018 15:40   Dg Chest Port 1 View  Result Date: 01/12/2018 CLINICAL DATA:  Abnormal respiration. EXAM: PORTABLE CHEST 1 VIEW COMPARISON:  01/11/2018. FINDINGS: Prior CABG. Heart size stable. No pulmonary venous congestion. Mild left base subsegmental atelectasis. Tiny stable bilateral pleural effusions. No pneumothorax. IMPRESSION: 1.  Prior CABG.  Heart size stable.  No pulmonary venous congestion. 2. Mild left base subsegmental axis. Tiny bilateral pleural effusions again noted. No interim change. Electronically Signed   By: Marcello Moores  Register   On: 01/12/2018 06:47   Dg  Chest Port 1 View  Result Date: 01/11/2018 CLINICAL DATA:  Acute respiratory failure EXAM: PORTABLE CHEST 1 VIEW COMPARISON:  01/05/2018 FINDINGS: Postop CABG. Negative for heart failure or edema. Improvement in by small bilateral pleural effusions. Negative for pneumonia. IMPRESSION: Lungs remain clear without edema or pneumonia. Improvement in small bilateral effusions. Electronically Signed   By: Franchot Gallo M.D.   On: 01/11/2018 07:04   Dg Chest Port 1 View  Result Date: 01/05/2018 CLINICAL DATA:  PT was just d/c yesterday afternoon for acute on chronic copd. became short of breath shortly after leaving yesterday. Hx pneumonia/htn/ex smoker/hx heart cath EXAM: PORTABLE CHEST 1 VIEW COMPARISON:  CT chest 04/03/2015 FINDINGS: The lungs are hyperinflated likely secondary to COPD. There is mild bilateral interstitial thickening likely chronic. There is no pleural effusion or pneumothorax. The heart and mediastinal contours are unremarkable. There is dilatation of the aortic arch. The osseous structures are unremarkable. IMPRESSION: No active disease. Dilatation of the aortic arch. Electronically Signed   By: Kathreen Devoid   On: 01/05/2018 12:20   Dg Chest Port 1 View  Result Date: 12/31/2017 CLINICAL DATA:  Shortness of breath EXAM: PORTABLE CHEST 1 VIEW COMPARISON:  March 17, 2017 FINDINGS: Lungs are somewhat hyperexpanded. There is chronic blunting of the left costophrenic angle. There is no frank edema or consolidation. Heart size and pulmonary vascularity are normal. No adenopathy. Aorta is mildly prominent but stable. No adenopathy. There is evidence of old trauma involving each lateral clavicle. IMPRESSION: Lungs hyperexpanded with chronic blunting of the left costophrenic angle. No edema or consolidation. Stable cardiac silhouette. Aortic prominence is likely indicative of chronic hypertensive change. Electronically Signed   By: Lowella Grip III M.D.   On: 12/31/2017 12:39   Ct Angio  Chest/abd/pel For Dissection W And/or W/wo  Result Date: 01/09/2018 CLINICAL DATA:  Acute drop in hemoglobin over the last 2 days with severe back pain and known chronic type B dissection involving the aortic arch, descending thoracic aorta and abdominal aorta. EXAM: CT ANGIOGRAPHY CHEST, ABDOMEN AND PELVIS TECHNIQUE: Multidetector CT imaging through the chest, abdomen and pelvis was performed using the standard protocol during bolus administration of intravenous contrast. Multiplanar reconstructed images and MIPs were obtained and reviewed to evaluate the vascular anatomy. CONTRAST:  162mL ISOVUE-370 IOPAMIDOL (ISOVUE-370) INJECTION 76% COMPARISON:  Prior CTA of the chest on 01/05/2018. CTA of the chest, abdomen and pelvis on 04/24/2017. FINDINGS: CTA CHEST FINDINGS Cardiovascular: Stable appearance of chronic dissection beginning at the origin of the left subclavian artery and extending down the descending thoracic aorta. Maximum diameter of  the distal arch is 4.5 cm which is stable. Maximum diameter of the descending thoracic aorta is approximately 4 cm and is stable. Proximal great vessels show stable and normal patency. There is stable irregular outpouching of the true lumen into the false lumen at the top of the aortic arch measuring roughly 2.7 cm in greatest diameter. No evidence of acute hemorrhage in the chest. The heart size is stable. No pericardial fluid identified. The ascending thoracic aorta is normal in caliber and shows no evidence of dissection. Central pulmonary arteries are normal in caliber and well opacified demonstrating normal patency. Mediastinum/Nodes: No enlarged mediastinal, hilar, or axillary lymph nodes. Thyroid gland, trachea, and esophagus demonstrate no significant findings. Lungs/Pleura: Stable emphysematous lung disease. There is no evidence of pulmonary edema, consolidation, pneumothorax, nodule or pleural fluid. Musculoskeletal: Beginning in the lower right chest wall, there  is evidence of hemorrhage anteriorly continuing into the upper abdominal wall. This is further described in the CTA abdomen report below. Review of the MIP images confirms the above findings. CTA ABDOMEN AND PELVIS FINDINGS VASCULAR Aorta: Stable evidence of dissection extending down the abdominal aorta and into the proximal right common iliac artery. Stable narrowing of the proximal right common iliac artery. Stable aneurysmal dilatation of the abdominal aorta measuring approximately 4.6 x 4.7 cm. No evidence of aneurysm rupture. Celiac: . small caliber celiac trunk emanating off of the true lumen and otherwise demonstrating normal patency. SMA: SMA originates off of the true lumen and demonstrates normal patency. Renals: Bilateral renal arteries emanate off of the true lumen and demonstrate normal patency. 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 Inflow: Bilateral iliac artery patency stable with stable stenosis of the proximal right common iliac artery of approximately 60-70%. Stable small caliber bilateral external iliac arteries. Review of the MIP images confirms the above findings. NON-VASCULAR Hepatobiliary: No focal liver abnormality is seen. No gallstones, gallbladder wall thickening, or biliary dilatation. Pancreas: Unremarkable. No pancreatic ductal dilatation or surrounding inflammatory changes. Spleen: Normal in size without focal abnormality. Adrenals/Urinary Tract: Adrenal glands are unremarkable. Kidneys are normal, without renal calculi, focal lesion, or hydronephrosis. Bladder is unremarkable. Stomach/Bowel: No bowel obstruction, ileus or free air. No phlegm a tori process or mass identified. Lymphatic: No enlarged lymph nodes identified. Reproductive: Prostate is unremarkable. Other: There are 2 separate areas of large spontaneous hemorrhage. Large hematoma of the lower anterior right chest wall extends into the upper right rectus sheath. Largest dimensions of the area of hemorrhage are  roughly 14.5 x 6.0 x 12.6 cm. There also is spontaneous hemorrhage in the right retroperitoneum involving the psoas muscle and extending laterally into the retroperitoneal space. Intramuscular psoas hemorrhage extends into the pelvis. Largest dimensions of the right retroperitoneal bleed are roughly 8.2 x 6.4 x 16 cm. Musculoskeletal: No acute or significant osseous findings. Review of the MIP images confirms the above findings. IMPRESSION: 1. No change in appearance of the chronic type B aortic dissection involving the distal arch, descending thoracic aorta and abdominal aorta. No evidence to suggest rupture or hemorrhage emanating from the aorta. 2. New spontaneous large hemorrhages involving the lower right anterior chest wall extending into the upper right rectus abdominal musculature and the right retroperitoneum involving the right iliopsoas muscle extending into the pelvis. 3. These results were called by telephone at the time of interpretation on 01/09/2018 at 11:15 am to Dr. Buford Dresser , who verbally acknowledged these results. Electronically Signed   By: Aletta Edouard M.D.   On: 01/09/2018 11:30  Ct Head Code Stroke Wo Contrast  Result Date: 01/08/2018 CLINICAL DATA:  Code stroke. EXAM: CT HEAD WITHOUT CONTRAST TECHNIQUE: Contiguous axial images were obtained from the base of the skull through the vertex without intravenous contrast. COMPARISON:  Head CT 05/11/2015 FINDINGS: Brain: There is no mass, hemorrhage or extra-axial collection. There is generalized atrophy without lobar predilection. Areas of hypoattenuation of the deep gray nuclei and confluent periventricular white matter hypodensity, consistent with chronic small vessel disease. Vascular: Atherosclerotic calcification of the vertebral and internal carotid arteries at the skull base. No abnormal hyperdensity of the major intracranial arteries or dural venous sinuses. Skull: The visualized skull base, calvarium and extracranial  soft tissues are normal. Sinuses/Orbits: No fluid levels or advanced mucosal thickening of the visualized paranasal sinuses. No mastoid or middle ear effusion. The orbits are normal. ASPECTS [facility] Stroke Program Early CT Score) - Ganglionic level infarction (caudate, lentiform nuclei, internal capsule, insula, M1-M3 cortex): 7 - Supraganglionic infarction (M4-M6 cortex): 3 Total score (0-10 with 10 being normal): 10 IMPRESSION: 1. CLINICAL DATA:  Code stroke. EXAM: CT HEAD WITHOUT CONTRAST TECHNIQUE: Contiguous axial images were obtained from the base of the skull through the vertex without intravenous contrast. COMPARISON:  Head CT 05/11/2015 FINDINGS: Brain: There is no mass, hemorrhage or extra-axial collection. There is generalized atrophy without lobar predilection. Areas of hypoattenuation of the deep gray nuclei and confluent periventricular white matter hypodensity, consistent with chronic small vessel disease. Vascular: Atherosclerotic calcification of the vertebral and internal carotid arteries at the skull base. No abnormal hyperdensity of the major intracranial arteries or dural venous sinuses. Skull: The visualized skull base, calvarium and extracranial soft tissues are normal. Sinuses/Orbits: No fluid levels or advanced mucosal thickening of the visualized paranasal sinuses. No mastoid or middle ear effusion. The orbits are normal. ASPECTS [facility] Stroke Program Early CT Score) - Ganglionic level infarction (caudate, lentiform nuclei, internal capsule, insula, M1-M3 cortex): 7 - Supraganglionic infarction (M4-M6 cortex): 3 Total score (0-10 with 10 being normal): 10 IMPRESSION: 1. No acute hemorrhage or mass effect. 2. ASPECTS is 10. 3. Generalized atrophy and findings of chronic ischemic microangiopathy, including multiple old lacunar infarcts. These results were called by telephone at the time of interpretation on 01/08/2018 at 12:28 am to Dr. Derrill Kay , who verbally acknowledged these  results. Electronically Signed   By: Ulyses Jarred M.D.   On: 01/08/2018 00:30    Lab Data:  CBC: Recent Labs  Lab 01/11/18 1749 01/12/18 0315 01/12/18 1511 01/13/18 0353 01/14/18 0427 01/15/18 0339  WBC 19.0* 17.9*  --  19.8* 21.4* 19.4*  NEUTROABS 16.9* 14.6*  --   --   --   --   HGB 8.2* 7.9* 9.8* 9.7* 10.3* 9.9*  HCT 24.3* 24.4* 30.1* 29.3* 31.1* 30.3*  MCV 92.7 92.8  --  93.3 94.5 94.1  PLT 176 175  --  209 227 947   Basic Metabolic Panel: Recent Labs  Lab 01/10/18 0546 01/11/18 0218 01/12/18 0315 01/13/18 0353 01/14/18 0427 01/15/18 0339  NA 138 138 139 134* 137 134*  K 4.6 4.6 4.7 4.0 4.5 4.3  CL 99 100 101 94* 98 97*  CO2 31 30 28 31 31 26   GLUCOSE 117* 134* 112* 130* 110* 107*  BUN 56* 57* 40* 30* 24* 26*  CREATININE 1.26* 1.13 0.93 0.97 0.98 0.93  CALCIUM 7.8* 7.8* 7.7* 8.0* 8.1* 8.1*  MG 2.1 2.1 1.8 1.6*  --   --   PHOS 3.5 3.1 2.0* 2.4*  --   --  GFR: Estimated Creatinine Clearance: 77.2 mL/min (by C-G formula based on SCr of 0.93 mg/dL). Liver Function Tests: Recent Labs  Lab 01/15/18 0339  AST 31  ALT 26  ALKPHOS 35*  BILITOT 0.9  PROT 4.8*  ALBUMIN 2.5*   No results for input(s): LIPASE, AMYLASE in the last 168 hours. No results for input(s): AMMONIA in the last 168 hours. Coagulation Profile: No results for input(s): INR, PROTIME in the last 168 hours. Cardiac Enzymes: Recent Labs  Lab 01/10/18 1405 01/10/18 2013 01/11/18 0218 01/11/18 0822  TROPONINI 0.18* 0.12* 0.22* 0.22*   BNP (last 3 results) No results for input(s): PROBNP in the last 8760 hours. HbA1C: No results for input(s): HGBA1C in the last 72 hours. CBG: Recent Labs  Lab 01/14/18 1157 01/14/18 1636 01/14/18 2101 01/15/18 0727 01/15/18 1112  GLUCAP 128* 144* 146* 100* 132*   Lipid Profile: No results for input(s): CHOL, HDL, LDLCALC, TRIG, CHOLHDL, LDLDIRECT in the last 72 hours. Thyroid Function Tests: No results for input(s): TSH, T4TOTAL, FREET4,  T3FREE, THYROIDAB in the last 72 hours. Anemia Panel: No results for input(s): VITAMINB12, FOLATE, FERRITIN, TIBC, IRON, RETICCTPCT in the last 72 hours. Urine analysis:    Component Value Date/Time   COLORURINE YELLOW 12/31/2017 2047   APPEARANCEUR CLEAR 12/31/2017 2047   LABSPEC 1.025 12/31/2017 2047   PHURINE 5.0 12/31/2017 2047   GLUCOSEU 250 (A) 12/31/2017 2047   HGBUR MODERATE (A) 12/31/2017 2047   BILIRUBINUR NEGATIVE 12/31/2017 2047   KETONESUR NEGATIVE 12/31/2017 2047   PROTEINUR 100 (A) 12/31/2017 2047   UROBILINOGEN 0.2 08/26/2012 0930   NITRITE NEGATIVE 12/31/2017 2047   LEUKOCYTESUR NEGATIVE 12/31/2017 2047     Hamzeh Tall M.D. [facility] 01/15/2018, 3:28 PM  Pager: [phone] Between 7am to 7pm - call Pager - [phone]  After 7pm go to [email or web address] - password [email or web address]  Call night coverage person covering after 7pm

## 2018-01-15 NOTE — Care Management Note (Signed)
Case Management Note  Patient Details  Name: [name] MRN: [id number] Date of Birth: [birth year]  Subjective/Objective: Pt is a 67 y.o. male admitted 01/06/18 with worsening SOB and chest pain; worked up for COPD exaberbation and NSTEMI. Pt found to have large RP bleed with hypovolemic hemorrhage.  PTA, pt independent and living with spouse.  He is on 3L chronic O2 at home.  .                     Action/Plan: PT recommending HH follow up.  Pt active with [facility] for HHPT prior to admission. Will need order for HHPT in order for [facility] services to resume.  Pt has PCP [name], and states he is able to afford his medications.  He is agreeable to [facility] follow up, and states that his wife is able to provide assistance at discharge.    Expected Discharge Date:                  Expected Discharge Plan:  [name]  In-House Referral:     Discharge planning Services  CM Consult  Post Acute Care Choice:  Home Health, Resumption of Svcs/PTA Provider Choice offered to:  Patient  DME Arranged:    DME Agency:     HH Arranged:  PT [place]:  [place]  Status of Service:  In process, will continue to follow  If discussed at Long Length of Stay Meetings, dates discussed:    Additional Comments:  Reinaldo Raddle, RN, BSN  Trauma/Neuro ICU Case Manager [phone]

## 2018-01-15 NOTE — Progress Notes (Addendum)
Progress Note  Patient Name: [name] Date of Encounter: 01/15/2018  Primary Cardiologist: Candee Furbish, MD   Subjective   No significant overnight events. Patient states he is feeling better today compared to yesterday. He continues to have right upper quadrant and epigastric pain. He also reports a non-productive cough for the past 3 weeks and continues to have episodes of shortness of breath.   Inpatient Medications    Scheduled Meds: . atorvastatin  20 mg Oral q1800  . benzonatate  200 mg Oral TID  . budesonide (PULMICORT) nebulizer solution  0.5 mg Nebulization BID  . busPIRone  7.5 mg Oral TID  . dextromethorphan-guaiFENesin  1 tablet Oral BID  . feeding supplement (ENSURE ENLIVE)  237 mL Oral BID BM  . gabapentin  300 mg Oral TID  . levothyroxine  50 mcg Oral Q0600  . lidocaine  1 patch Transdermal Q24H  . metoprolol tartrate  25 mg Oral BID  . nicotine  21 mg Transdermal Daily  . pantoprazole  40 mg Oral QHS  . predniSONE  20 mg Oral Q breakfast   Continuous Infusions: . sodium chloride Stopped (01/12/18 0932)   PRN Meds: sodium chloride, acetaminophen **OR** acetaminophen, ALPRAZolam, alum & mag hydroxide-simeth, bisacodyl, chlorpheniramine-HYDROcodone, hydrALAZINE, ipratropium-albuterol, ondansetron **OR** ondansetron (ZOFRAN) IV, oxyCODONE-acetaminophen, polyethylene glycol, traZODone   Vital Signs    Vitals:   01/14/18 2126 01/15/18 0412 01/15/18 0724 01/15/18 0747  BP:  (!) 147/95 111/61 111/61  Pulse: 79 81 63 67  Resp:  20  16  Temp:  97.8 F (36.6 C) 98 F (36.7 C)   TempSrc:  Oral Oral   SpO2:  100% 100% 98%  Weight:  69.9 kg    Height:        Intake/Output Summary (Last 24 hours) at 01/15/2018 0915 Last data filed at 01/15/2018 0200 Gross per 24 hour  Intake -  Output 1475 ml  Net -1475 ml   Filed Weights   01/13/18 0445 01/14/18 0417 01/15/18 0412  Weight: 72.1 kg 70.3 kg 69.9 kg    Telemetry    Sinus rhythm with heart rate from  the high 50's to 80's and PACs. - Personally Reviewed  ECG    No new ECG tracing today. - Personally Reviewed  Physical Exam   GEN: Caucasian male resting comfortably. Alert and in no acute distress.   Neck: Supple. Cardiac: RRR. No murmurs, rubs, or gallops.  Respiratory: No increased work of breathing. Diffuse wheezing and scattered rhonchi noted. GI: Abdomen soft, non-distended, and tender to palpation of RUQ and epigastric area. Bowel sounds present.  MS: No lower extremity edema.  Skin: Warm and dry.  Neuro:  No focal deficits.  Psych: Normal affect.   Labs    Chemistry Recent Labs  Lab 01/13/18 0353 01/14/18 0427 01/15/18 0339  NA 134* 137 134*  K 4.0 4.5 4.3  CL 94* 98 97*  CO2 31 31 26   GLUCOSE 130* 110* 107*  BUN 30* 24* 26*  CREATININE 0.97 0.98 0.93  CALCIUM 8.0* 8.1* 8.1*  PROT  --   --  4.8*  ALBUMIN  --   --  2.5*  AST  --   --  31  ALT  --   --  26  ALKPHOS  --   --  35*  BILITOT  --   --  0.9  GFRNONAA >60 >60 >60  GFRAA >60 >60 >60  ANIONGAP 9 8 11      Hematology Recent Labs  Lab 01/13/18 0353 01/14/18 0427 01/15/18 0339  WBC 19.8* 21.4* 19.4*  RBC 3.14* 3.29* 3.22*  HGB 9.7* 10.3* 9.9*  HCT 29.3* 31.1* 30.3*  MCV 93.3 94.5 94.1  MCH 30.9 31.3 30.7  MCHC 33.1 33.1 32.7  RDW 15.2 15.0 14.8  PLT 209 227 221    Cardiac Enzymes Recent Labs  Lab 01/10/18 1405 01/10/18 2013 01/11/18 0218 01/11/18 0822  TROPONINI 0.18* 0.12* 0.22* 0.22*   No results for input(s): TROPIPOC in the last 168 hours.   BNPNo results for input(s): BNP, PROBNP in the last 168 hours.   DDimer No results for input(s): DDIMER in the last 168 hours.   Radiology    Ct Abdomen Pelvis Wo Contrast  Result Date: 01/14/2018 CLINICAL DATA:  Recent chest wall/abdominal wall and retroperitoneal hemorrhage. Worsening abdominal pain. EXAM: CT ABDOMEN AND PELVIS WITHOUT CONTRAST TECHNIQUE: Multidetector CT imaging of the abdomen and pelvis was performed following the  standard protocol without IV contrast. COMPARISON:  CTA of the chest, abdomen and pelvis on 01/09/2018 FINDINGS: Lower chest: The hemorrhage involving the right anterior lower chest wall and extending into the upper right rectus sheath shows substantial improvement since the prior CT. Length of area of residual hemorrhage measures approximately 12 cm and the hematoma now measures only 3 cm in thickness compared to approximately 6 cm previously. Component extending into the right rectus sheath has nearly completely resolved. There is a new small right pleural effusion. Hepatobiliary: No focal liver abnormality is seen. No gallstones, gallbladder wall thickening, or biliary dilatation. Pancreas: Unremarkable. No pancreatic ductal dilatation or surrounding inflammatory changes. Spleen: Normal in size without focal abnormality. Adrenals/Urinary Tract: No hydronephrosis. Continued anterior displacement of right kidney due to posterior right retroperitoneal hemorrhage. Stomach/Bowel: No evidence of bowel obstruction, ileus or free air. Vascular/Lymphatic: No enlarged lymph nodes identified. The right posterior retroperitoneal hemorrhage abutting the psoas muscle shows decrease in size and now measures approximately 6.5 cm in greatest diameter compared to just over 8 cm. There also is less hemorrhage tracking along the ileus psoas muscle into the right pelvis. No new hemorrhage identified. Stable abdominal aortic aneurysm measuring approximately 4.7 cm in greatest diameter. Reproductive: Prostate is unremarkable. Other: No hernias identified. Musculoskeletal: No acute or significant osseous findings. IMPRESSION: Significant reduction in hematomas involving the right lower anterior chest wall extending into the right rectus sheath and the posterior right retroperitoneum. Both areas of hemorrhage show significant decrease in size, especially the anterior chest/abdominal wall hemorrhage. No new hemorrhage identified.  Electronically Signed   By: Aletta Edouard M.D.   On: 01/14/2018 14:08    Cardiac Studies   Echocardiogram 01/06/2018: Study Conclusions: - Left ventricle: The cavity size was normal. Wall thickness was increased in a pattern of moderate LVH. Systolic function was normal. The estimated ejection fraction was in the range of 55% to 60%. - Aortic valve: Mildly calcified annulus. Mildly thickened leaflets. Valve area (VTI): 2.26 cm^2. Valve area (Vmax): 2.26 cm^2. - Mitral valve: Mildly calcified annulus. Mildly thickened leaflets - Technically difficult study.  Patient Profile     Mr. Ishler a 67 year old malewith a history of CAD (s/p CABG in 2009 with catheterization in 02/2008 showing occluded SVG-PDA and SVG-OM, repeat catheterization in 02/2015 showing patency of LIMA-LAD, SVG-D1, and free RIMA-Ramus and PCI/DES of native RCA), Type B aortic dissection, chronic hypoxic respiratory failure (on 3-4L [place] at baseline), hyperlipidemia, and orthostatic hypotensionwhowas admitted forchest pain and elevated troponin on 01/05/2018. He was started on IV heparin  with plans for left heart catheterization. However 01/09/2018, he developedsevere back pain, hypotension with SBP 60's and AMS. CT showed a large retroperitoneal bleed(previously known type B aortic dissection was stable).  Assessment & Plan    Chest Pain with Elevated Troponin - Patient presented with worsening dyspnea after having recently been discharged for a COPD exacerbation. - Initial cyclic troponin values were flat at 0.16 >> 0.12 >> 0.19. Follow-up troponin after development of retroperitoneal bleed peaked at 0.22. - EKG on 12/21 showed new T wave inversion in lateral leads.  - Echocardiogram this admission showed LVEF 55-60%. - Initial plans were for LHC; however, this was cancelled due to retroperitoneal bleed. IV Heparin and Aspirin were discontinued. Plan for now is medical therapy.  - Patient reports right  upper quadrant and epigastric pain today but denies any real angina.   Hypovolemic Hemorrhagic Shock Secondary to Retroperitoneal Bleed - BP currently stable at 111/61 - Hgb stable at 9.9 today. - Patient has received multiple blood transfusions.  - Repeat CT showed significant reduction in hematomas involving the right lower anterior chest wall. Both areas of hemorrhage were also significantly decreased in size. No new hemorrhage was identified.  - Per primary team and Critical Care.  Type B Aortic Dissection - Aortic dissection appears to be chronic and stable. It does not appear to be related to the retroperitoneal bleed.  - Continue Lopressor 25mg  twice daily. Titrate as BP and heart rate allow.  For questions or updates, please contact [place] Please consult [email or web address] for contact info under        Signed, Darreld Mclean, PA-C  01/15/2018, 9:15 AM     Attending Note:   The patient was seen and examined.  Agree with assessment and plan as noted above.  Changes made to the above note as needed.  Patient seen and independently examined with Sande Rives, PA .   We discussed all aspects of the encounter. I agree with the assessment and plan as stated above.  1.  Chest pain with elevated troponin levels: The troponin levels were flat.  This is not consistent with an acute coronary syndrome. Suspect it was due to demand ischemia. Plan is for medical therapy at this time.  .  Type B aortic dissection: He is currently on metoprolol 25 mg twice a day. Blood pressure and heart rate are very well controlled.   I have spent a total of 40 minutes with patient reviewing hospital  notes , telemetry, EKGs, labs and examining patient as well as establishing an assessment and plan that was discussed with the patient. > 50% of time was spent in direct patient care.  [facility] will sign off.   Medication Recommendations:  Continue current m,eds  Other recommendations  (labs, testing, etc):   Follow up as an outpatient:  Follow up with [name].  He has seen Dr. Roxan Hockey in the past and has been scheduled to see Dr. Trula Slade ( does not look like he made it to that appt)     Ramond Dial., MD, [facility] 01/15/2018, 9:54 AM [address],  [place] Pager [phone]

## 2018-01-15 NOTE — Evaluation (Signed)
Physical Therapy Evaluation Patient Details Name: [name] MRN: [id number] DOB: [birth year] Today's Date: 01/15/2018   History of Present Illness  Pt is a 67 y.o. male admitted 01/06/18 with worsening SOB and chest pain; worked up for COPD exaberbation and NSTEMI. Of note recent d/c home from hospital day prior after admission 12/11-12/16 for management of COPD exacerbation. CTA negative for PE. Developed severe back pain 12/20; CT showed large retroperitoneal bleed; previously known B aortic dissection stable. Pt worked up for hypovolemic hemorrhagic shock secondary to bleed. Repeat CT showed significant reduction in hematomas; aortic dissection stable. Other PMH includes HTN, chronic bronchitis, CAD, chronic pain, depression, anxiety.    Clinical Impression  Pt presents with an overall decrease in functional mobility secondary to above. PTA, pt indep and lives with wife available for 24/7 support. Today, pt ambulatory with RW at supervision-level. SpO2 >94% on 3L O2 [place]; BP stable (see values below). Educated on energy conservation and fall risk reduction. Encouraged use of RW/rollator for energy conservation and added stability due to chronic back/hip pain.Pt would benefit from continued acute PT services to maximize functional mobility and independence prior to d/c with HHPT services.  Supine BP 108/88 Seated BP 111/65 Standing BP 128/108 Post-ambulation BP 117/103     Follow Up Recommendations Home health PT;Supervision - Intermittent    Equipment Recommendations  None recommended by PT    Recommendations for Other Services       Precautions / Restrictions Precautions Precautions: Fall Precaution Comments: Baseline 3-5L home O2 Restrictions Weight Bearing Restrictions: No      Mobility  Bed Mobility Overal bed mobility: Independent                Transfers Overall transfer level: Modified independent Equipment used: None;Rolling walker (2 wheeled) Transfers: Sit  to/from Stand           General transfer comment: Pt attempting to push up on RW with BUE support; mod indep once hand placement corrected  Ambulation/Gait Ambulation/Gait assistance: Supervision Gait Distance (Feet): 120 Feet Assistive device: Rolling walker (2 wheeled) Gait Pattern/deviations: Step-through pattern;Decreased stride length;Antalgic;Trunk flexed Gait velocity: decreased Gait velocity interpretation: 1.31 - 2.62 ft/sec, indicative of limited community ambulator General Gait Details: Slow, labored ambulation with RW and supervision for safety. C/o chronic back and bilateral hip pain. SpO2 >94% on 3L O2 [place]  Stairs            Wheelchair Mobility    Modified Rankin (Stroke Patients Only)       Balance Overall balance assessment: Mild deficits observed, not formally tested                                           Pertinent Vitals/Pain Pain Assessment: Faces Faces Pain Scale: Hurts little more Pain Location: Back, hips, legs, abdomen... Pain Descriptors / Indicators: Constant;Sore;Discomfort Pain Intervention(s): Monitored during session;Limited activity within patient's tolerance    Home Living Family/patient expects to be discharged to:: Private residence Living Arrangements: Spouse/significant other Available Help at Discharge: Family;Available 24 hours/day Type of Home: House Home Access: Stairs to enter Entrance Stairs-Rails: Right Entrance Stairs-Number of Steps: 3 Home Layout: One level Home Equipment: Cane - single point;Shower seat;Walker - 2 wheels;Crutches      Prior Function Level of Independence: Independent         Comments: Short community distances; drives; 9-7X O2 baseline  Hand Dominance        Extremity/Trunk Assessment   Upper Extremity Assessment Upper Extremity Assessment: Overall WFL for tasks assessed    Lower Extremity Assessment Lower Extremity Assessment: Generalized weakness     Cervical / Trunk Assessment Cervical / Trunk Assessment: Kyphotic  Communication   Communication: No difficulties  Cognition Arousal/Alertness: Awake/alert Behavior During Therapy: WFL for tasks assessed/performed Overall Cognitive Status: Within Functional Limits for tasks assessed                                 General Comments: WFL for simple tasks      General Comments General comments (skin integrity, edema, etc.): Supine BP 108/88, seated BP 111/65, standing BP 128/108, post-ambulation BP 117/103    Exercises     Assessment/Plan    PT Assessment Patient needs continued PT services  PT Problem List Decreased strength;Decreased activity tolerance;Decreased balance;Decreased mobility;Cardiopulmonary status limiting activity       PT Treatment Interventions Gait training;Stair training;Functional mobility training;Therapeutic activities;Patient/family education;Therapeutic exercise;DME instruction;Balance training    PT Goals (Current goals can be found in the Care Plan section)  Acute Rehab PT Goals Patient Stated Goal: "Go home and not have to come right back next time" PT Goal Formulation: With patient Time For Goal Achievement: 01/29/18 Potential to Achieve Goals: Good    Frequency Min 3X/week   Barriers to discharge        Co-evaluation               AM-PAC PT "6 Clicks" Mobility  Outcome Measure Help needed turning from your back to your side while in a flat bed without using bedrails?: None Help needed moving from lying on your back to sitting on the side of a flat bed without using bedrails?: None Help needed moving to and from a bed to a chair (including a wheelchair)?: None Help needed standing up from a chair using your arms (e.g., wheelchair or bedside chair)?: A Little Help needed to walk in hospital room?: A Little Help needed climbing 3-5 steps with a railing? : A Little 6 Click Score: 21    End of Session Equipment  Utilized During Treatment: Gait belt;Oxygen Activity Tolerance: Patient tolerated treatment well;Patient limited by fatigue Patient left: in bed;with call bell/phone within reach Nurse Communication: Mobility status PT Visit Diagnosis: Other abnormalities of gait and mobility (R26.89);Muscle weakness (generalized) (M62.81)    Time: 3646-8032 PT Time Calculation (min) (ACUTE ONLY): 20 min   Charges:   PT Evaluation $PT Eval Moderate Complexity: 1 Mod        Mabeline Caras, PT, DPT Acute Rehabilitation Services  Pager [phone] Office [phone]  Derry Lory 01/15/2018, 12:36 PM

## 2018-01-15 NOTE — Care Management Important Message (Signed)
Important Message  Patient Details  Name: [name] MRN: [id number] Date of Birth: February 28, 1950   Medicare Important Message Given:  Yes    Doran Nestle P Kynslee Baham 01/15/2018, 5:03 PM

## 2018-01-16 LAB — BASIC METABOLIC PANEL
Anion gap: 11 (ref 5–15)
BUN: 25 mg/dL — ABNORMAL HIGH (ref 8–23)
CO2: 30 mmol/L (ref 22–32)
Calcium: 8.1 mg/dL — ABNORMAL LOW (ref 8.9–10.3)
Chloride: 98 mmol/L (ref 98–111)
Creatinine, Ser: 0.8 mg/dL (ref 0.61–1.24)
GFR calc Af Amer: >60 mL/min (ref >60)
GFR calc non Af Amer: >60 mL/min (ref >60)
Glucose, Bld: 97 mg/dL (ref 70–99)
Potassium: 3.9 mmol/L (ref 3.5–5.1)
Sodium: 139 mmol/L (ref 135–145)

## 2018-01-16 LAB — GLUCOSE, CAPILLARY
Glucose-Capillary: 122 mg/dL — ABNORMAL HIGH (ref 70–99)
Glucose-Capillary: 104 mg/dL — ABNORMAL HIGH (ref 70–99)
Glucose-Capillary: 84 mg/dL (ref 70–99)
Glucose-Capillary: 121 mg/dL — ABNORMAL HIGH (ref 70–99)
Glucose-Capillary: 142 mg/dL — ABNORMAL HIGH (ref 70–99)
Glucose-Capillary: 132 mg/dL — ABNORMAL HIGH (ref 70–99)

## 2018-01-16 LAB — CBC
HCT: 29.8 % — ABNORMAL LOW (ref 39.0–52.0)
Hemoglobin: 9.4 g/dL — ABNORMAL LOW (ref 13.0–17.0)
MCH: 30.5 pg (ref 26.0–34.0)
MCHC: 31.5 g/dL (ref 30.0–36.0)
MCV: 96.8 fL (ref 80.0–100.0)
Platelets: 212 10*3/uL (ref 150–400)
RBC: 3.08 MIL/uL — ABNORMAL LOW (ref 4.22–5.81)
RDW: 14.9 % (ref 11.5–15.5)
WBC: 16.1 10*3/uL — ABNORMAL HIGH (ref 4.0–10.5)
nRBC: 0 % (ref 0.0–0.2)

## 2018-01-16 MED ORDER — CLONAZEPAM 0.5 MG PO TABS
0.2500 mg | ORAL_TABLET | Freq: Every day | ORAL | Status: DC
  Administered 2018-01-16: 0.25 mg via ORAL
  Filled 2018-01-16: qty 1

## 2018-01-16 MED ORDER — CLONAZEPAM 0.25 MG PO TBDP
0.2500 mg | ORAL_TABLET | Freq: Every day | ORAL | Status: DC
  Administered 2018-01-17 – 2018-01-18 (×2): 0.25 mg via ORAL
  Filled 2018-01-16 (×2): qty 1

## 2018-01-16 NOTE — Progress Notes (Addendum)
Physical Therapy Treatment Patient Details Name: [name] MRN: [id number] DOB: 02-May-1950 Today's Date: 01/16/2018    History of Present Illness Pt is a 67 y.o. male admitted 01/06/18 with worsening SOB and chest pain; worked up for COPD exaberbation and NSTEMI. Of note recent d/c home from hospital day prior after admission 12/11-12/16 for management of COPD exacerbation. CTA negative for PE. Developed severe back pain 12/20; CT showed large retroperitoneal bleed; previously known B aortic dissection stable. Pt worked up for hypovolemic hemorrhagic shock secondary to bleed. Repeat CT showed significant reduction in hematomas; aortic dissection stable. Other PMH includes HTN, chronic bronchitis, CAD, chronic pain, depression, anxiety.   PT Comments    Pt progressing with mobility. Slow, labored ambulation with RW at supervision-level; pt reports "walking like this just takes it out of me," requiring intermittent standing rest breaks secondary to pain and fatigue. Provided education on self-monitoring activity tolerance; encouraged ambulating shorter distances more frequently throughout day, allowing enough time for recover. SpO2 >90% on 3L O2 [place]. Educ on some deep breathing and mindfulness techniques as pt reports increased anxiety causing difficulty breathing. Will follow acutely.   Follow Up Recommendations  Home health PT;Supervision - Intermittent     Equipment Recommendations  None recommended by PT    Recommendations for Other Services       Precautions / Restrictions Precautions Precautions: Fall Precaution Comments: Baseline 3-5L home O2 Restrictions Weight Bearing Restrictions: No    Mobility  Bed Mobility Overal bed mobility: Independent                Transfers Overall transfer level: Modified independent Equipment used: Rolling walker (2 wheeled) Transfers: Sit to/from Stand           General transfer comment: Cues for correct hand placement; once  corrected, mod indep to stand with RW  Ambulation/Gait Ambulation/Gait assistance: Supervision Gait Distance (Feet): 160 Feet Assistive device: Rolling walker (2 wheeled) Gait Pattern/deviations: Step-through pattern;Decreased stride length;Antalgic;Trunk flexed Gait velocity: decreased Gait velocity interpretation: 1.31 - 2.62 ft/sec, indicative of limited community ambulator General Gait Details: Slow, labored, antalgic ambulation with RW and supervision for safety; required intermittent standing rest breaks leaning on arm rail or wall secondary to back/hip pain.    Stairs Stairs: (Pt declined)           Wheelchair Mobility    Modified Rankin (Stroke Patients Only)       Balance Overall balance assessment: Needs assistance   Sitting balance-Leahy Scale: Good       Standing balance-Leahy Scale: Poor Standing balance comment: Reliant on UE support                            Cognition Arousal/Alertness: Awake/alert Behavior During Therapy: WFL for tasks assessed/performed;Anxious Overall Cognitive Status: Within Functional Limits for tasks assessed                                 General Comments: Pt repetitive and tangential; increased time spent discussing anxiety and difficulty breathing      Exercises      General Comments General comments (skin integrity, edema, etc.): Seated BP 111/71, post-ambulation BP 140/91. HR 87-92 at rest. SpO2 >90% on 3L O2 [place] throughout session      Pertinent Vitals/Pain Pain Assessment: Faces Faces Pain Scale: Hurts little more Pain Location: Chest, abdomen, hips, legs, back Pain Descriptors / Indicators:  Constant;Sore;Discomfort Pain Intervention(s): Monitored during session;Limited activity within patient's tolerance    Home Living                      Prior Function            PT Goals (current goals can now be found in the care plan section) Acute Rehab PT Goals Patient Stated  Goal: "Go home and not have to come right back next time" PT Goal Formulation: With patient Time For Goal Achievement: 01/29/18 Potential to Achieve Goals: Good Progress towards PT goals: Progressing toward goals    Frequency    Min 3X/week      PT Plan Current plan remains appropriate    Co-evaluation              AM-PAC PT "6 Clicks" Mobility   Outcome Measure  Help needed turning from your back to your side while in a flat bed without using bedrails?: None Help needed moving from lying on your back to sitting on the side of a flat bed without using bedrails?: None Help needed moving to and from a bed to a chair (including a wheelchair)?: None Help needed standing up from a chair using your arms (e.g., wheelchair or bedside chair)?: A Little Help needed to walk in hospital room?: A Little Help needed climbing 3-5 steps with a railing? : A Little 6 Click Score: 21    End of Session Equipment Utilized During Treatment: Gait belt;Oxygen Activity Tolerance: Patient tolerated treatment well;Patient limited by fatigue;Patient limited by pain Patient left: in bed;with call bell/phone within reach;with nursing/sitter in room Nurse Communication: Mobility status PT Visit Diagnosis: Other abnormalities of gait and mobility (R26.89);Muscle weakness (generalized) (M62.81)     Time: 3016-0109 PT Time Calculation (min) (ACUTE ONLY): 24 min  Charges:  $Gait Training: 8-22 mins $Self Care/Home Management: [place], PT, DPT Acute Rehabilitation Services  Pager [phone] Office [phone]  Derry Lory 01/16/2018, 9:58 AM

## 2018-01-16 NOTE — Consult Note (Signed)
   [facility] CM Inpatient Consult   01/16/2018  BEVIN MAYALL January 05, 1951 [id number]   Patient screened for potential [facility] Care Management services due to unplanned readmission risk score of 25%, high, and multiple hospitalizations.   Went to bedside to speak with patient and explain[facility] Care Management Services. 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 Patient states that he needs assistance navigating medication refills. States that his wife provides transportation for medical appointments.  Mr. Corlett signed consent form. Referral will be placed to [facility] and pharmacy for post hospital follow up.  Netta Cedars, MSN, [facility] Liaison Nurse Mobile Phone [phone]  Toll free office [phone]

## 2018-01-16 NOTE — Progress Notes (Signed)
[facility]                                                                              Patient Demographics  [name], is a 67 y.o. male, DOB - 1950/10/05, FYB:[id number]  Admit date - 01/05/2018   Admitting Physician Ejiroghene Arlyce Dice, MD  Outpatient Primary MD for the patient is Shelbie Ammons, MD  Outpatient specialists:   LOS - 10  days   Medical records reviewed and are as summarized below:    Chief Complaint  Patient presents with  . Shortness of Breath       Brief summary   67yo male with hx chronic hypoxic respiratory failure on 3-4L home O2 r/t COPD, CAD s/p CABG 2009 with known severe 3V disease on cath 2017, HTN, chronic type B aortic dissection with recent admission for AECOPD, re-admitted 12/16 to [facility] with chest pain and SOB.  W/u neg for PE but did reveal elevated troponin (0.16, 0.12, 0.19) and some EKG changes and was therefore seen by cardiology, started on heparin gtt and ASA and tx to [facility] for tentative cardiac cath.  On 12/20 developed severe back pain, hypotension with SBP 60's and AMS and CT abd revealed large retroperitoneal bleed.  Patient was transferred to ICU under PCCM care Patient transferred back to floor, [facility] service assumed care on 12/24 12/26- Admit to [facility] 12/16 with NSTEMI 12/20- To ICU with RP hemorrhage, hypotension  Assessment & Plan    Hemorrhagic shock with hypotension in the setting of retroperitoneal/ chest wall bleed, acute blood loss anemia -Goal hemoglobin 9-10 with NSTEMI, has received RBC transfusions -Repeat CT abdomen pelvis showed significant reduction in the hematomas involving the right lower anterior chest wall extending into the right rectus sheath and the posterior right retroperitoneum, no new hemorrhage. -H&H stable  NSTEMI -Patient had initially presented with worsening dyspnea, elevated troponins, new T wave inversions in lateral leads -2D echo showed EF of 55 to 60%, patient was  started on heparin drip. -Initial plans were for left heart cath however patient developed retroperitoneal bleed, IV heparin and aspirin were discontinued.  Cardiology now recommends medical therapy not a candidate for interventional procedures. -H&H stable, goal 9-10  Acute on chronic hypoxic respiratory failure with COPD exacerbation -Slowly improving, O2 sats 98% on 3 L currently, wean O2 as tolerated  -Continue O2, bronchodilators, prednisone, PPI   Leukocytosis Possibly due to #1 and steroids.,  No fevers or chills or infectious etiology.  Chronic low back pain -Continue Percocet as needed  Known chronic type B aortic dissection chronic and stable, cardiology following.  BP control, continue Lopressor 25 mg twice a day  Essential hypertension BP controlled  Anxiety: Patient has significant anxiety issues regarding going home. Placed on low dose klonopin.    Code Status: Full CODE STATUS DVT Prophylaxis:  SCD's Family Communication: Discussed in detail with the patient, all imaging results, lab results explained to the patient    Disposition Plan: Very overwhelmed about going home, feels either he would be back or something would happen.  Explained that home health PT OT, nursing home health aide can be  arranged.  Feels had a rough last night with shortness of breath.   Time Spent in minutes 25 minutes  Procedures:  CT angio chest abdomen and pelvis  Consultants:   Cardiology  Antimicrobials:      Medications  Scheduled Meds: . atorvastatin  20 mg Oral q1800  . benzonatate  200 mg Oral TID  . budesonide (PULMICORT) nebulizer solution  0.5 mg Nebulization BID  . busPIRone  7.5 mg Oral TID  . dextromethorphan-guaiFENesin  1 tablet Oral BID  . feeding supplement (ENSURE ENLIVE)  237 mL Oral BID BM  . gabapentin  300 mg Oral TID  . levothyroxine  50 mcg Oral Q0600  . lidocaine  1 patch Transdermal Q24H  . metoprolol tartrate  25 mg Oral BID  . nicotine  21 mg  Transdermal Daily  . pantoprazole  40 mg Oral QHS  . predniSONE  20 mg Oral Q breakfast   Continuous Infusions: . sodium chloride Stopped (01/12/18 0932)   PRN Meds:.sodium chloride, acetaminophen **OR** acetaminophen, ALPRAZolam, alum & mag hydroxide-simeth, bisacodyl, chlorpheniramine-HYDROcodone, hydrALAZINE, ipratropium-albuterol, ondansetron **OR** ondansetron (ZOFRAN) IV, oxyCODONE-acetaminophen, polyethylene glycol, traZODone   Antibiotics   Anti-infectives (From admission, onward)   None        Subjective:   [name] was seen and examined today.  Abdominal pain, back pain 6/10. very anxious. .  No chest pain or shortness of breath, nausea or vomiting, fevers.    Objective:   Vitals:   01/16/18 0459 01/16/18 0719 01/16/18 0758 01/16/18 0912  BP: 136/64 118/83  (!) 140/91  Pulse: 72 72 74 80  Resp: 18  18   Temp: 98.2 F (36.8 C) 98.1 F (36.7 C)    TempSrc: Oral Oral    SpO2: 99% 100% 98%   Weight:      Height:        Intake/Output Summary (Last 24 hours) at 01/16/2018 1358 Last data filed at 01/16/2018 0830 Gross per 24 hour  Intake 540 ml  Output -  Net 540 ml     Wt Readings from Last 3 Encounters:  01/16/18 70.9 kg  12/31/17 74.8 kg  05/27/17 65.8 kg       Physical Exam  General: Alert and oriented x 3, NAD, anxious  Eyes: PERRLA, EOMI, Anicteric Sclera,  HEENT:  Atraumatic, normocephalic  Cardiovascular: S1 S2 clear,  RRR. No pedal edema b/l  Respiratory: CTAB, no wheezing, rales or rhonchi  Gastrointestinal: Soft, mild diffuse tenderness, nondistended, NBS  Ext: no pedal edema bilaterally  Neuro: no new deficits  Musculoskeletal: No cyanosis, clubbing  Skin: No rashes  Psych: anxious, alert and oriented x3      Data Reviewed:  I have personally reviewed following labs and imaging studies  Micro Results No results found for this or any previous visit (from the past 240 hour(s)).  Radiology Reports Ct Abdomen Pelvis  Wo Contrast  Result Date: 01/14/2018 CLINICAL DATA:  Recent chest wall/abdominal wall and retroperitoneal hemorrhage. Worsening abdominal pain. EXAM: CT ABDOMEN AND PELVIS WITHOUT CONTRAST TECHNIQUE: Multidetector CT imaging of the abdomen and pelvis was performed following the standard protocol without IV contrast. COMPARISON:  CTA of the chest, abdomen and pelvis on 01/09/2018 FINDINGS: Lower chest: The hemorrhage involving the right anterior lower chest wall and extending into the upper right rectus sheath shows substantial improvement since the prior CT. Length of area of residual hemorrhage measures approximately 12 cm and the hematoma now measures only 3 cm in thickness compared to approximately 6 cm  previously. Component extending into the right rectus sheath has nearly completely resolved. There is a new small right pleural effusion. Hepatobiliary: No focal liver abnormality is seen. No gallstones, gallbladder wall thickening, or biliary dilatation. Pancreas: Unremarkable. No pancreatic ductal dilatation or surrounding inflammatory changes. Spleen: Normal in size without focal abnormality. Adrenals/Urinary Tract: No hydronephrosis. Continued anterior displacement of right kidney due to posterior right retroperitoneal hemorrhage. Stomach/Bowel: No evidence of bowel obstruction, ileus or free air. Vascular/Lymphatic: No enlarged lymph nodes identified. The right posterior retroperitoneal hemorrhage abutting the psoas muscle shows decrease in size and now measures approximately 6.5 cm in greatest diameter compared to just over 8 cm. There also is less hemorrhage tracking along the ileus psoas muscle into the right pelvis. No new hemorrhage identified. Stable abdominal aortic aneurysm measuring approximately 4.7 cm in greatest diameter. Reproductive: Prostate is unremarkable. Other: No hernias identified. Musculoskeletal: No acute or significant osseous findings. IMPRESSION: Significant reduction in hematomas  involving the right lower anterior chest wall extending into the right rectus sheath and the posterior right retroperitoneum. Both areas of hemorrhage show significant decrease in size, especially the anterior chest/abdominal wall hemorrhage. No new hemorrhage identified. Electronically Signed   By: Aletta Edouard M.D.   On: 01/14/2018 14:08   Ct Angio Chest Pe W And/or Wo Contrast  Result Date: 01/05/2018 CLINICAL DATA:  Increased shortness of breath since discharge from the hospital on 01/04/2018. EXAM: CT ANGIOGRAPHY CHEST WITH CONTRAST TECHNIQUE: Multidetector CT imaging of the chest was performed using the standard protocol during bolus administration of intravenous contrast. Multiplanar CT image reconstructions and MIPs were obtained to evaluate the vascular anatomy. CONTRAST:  16mL ISOVUE-370 IOPAMIDOL (ISOVUE-370) INJECTION 76% COMPARISON:  Formal chest obtained earlier today and chest CTA dated 04/03/2015. 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 There has been interval thrombosis of the false lumen of the dissection with the exception of a small amount of persistent opacified blood in the aortic arch superiorly on the left. No significant narrowing of the true lumen of the aorta is seen. Stable post CABG changes. Atheromatous calcifications, including the coronary arteries and aorta. Mediastinum/Nodes: No enlarged mediastinal, hilar, or axillary lymph nodes. Thyroid gland, trachea, and esophagus demonstrate no significant findings. Lungs/Pleura: The lungs remain mildly hyperexpanded with mild bullous changes. Minimal residual atelectasis or scarring at the right lung base posteriorly.  No pleural fluid. Upper Abdomen: Aortic dissection extending into the proximal left renal artery, as described above. Musculoskeletal: Motion artifacts making it difficult to assess for rib and sternal fractures. Thoracic spine degenerative changes and mild scoliosis. Lower cervical spine degenerative changes. Review of the MIP images confirms the above findings. IMPRESSION: 1. 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 2. Interval thrombosis of the false lumen of the dissection with the exception of a small amount of persistent opacified blood in the aortic arch superiorly on the left. 3. Mild changes of COPD. 4. Calcific coronary artery and aortic atherosclerosis and post CABG changes. Aortic Atherosclerosis (ICD10-I70.0) and Emphysema (ICD10-J43.9). Electronically Signed  By: Claudie Revering M.D.   On: 01/05/2018 15:40   Dg Chest Port 1 View  Result Date: 01/12/2018 CLINICAL DATA:  Abnormal respiration. EXAM: PORTABLE CHEST 1 VIEW COMPARISON:  01/11/2018. FINDINGS: Prior CABG. Heart size stable. No pulmonary venous congestion. Mild left base subsegmental atelectasis. Tiny stable bilateral pleural effusions. No pneumothorax. IMPRESSION: 1.  Prior CABG.  Heart size stable.  No pulmonary venous congestion. 2. Mild left base subsegmental axis. Tiny bilateral pleural effusions again noted. No interim change. Electronically Signed   By: Marcello Moores  Register   On: 01/12/2018 06:47   Dg Chest Port 1 View  Result Date: 01/11/2018 CLINICAL DATA:  Acute respiratory failure EXAM: PORTABLE CHEST 1 VIEW COMPARISON:  01/05/2018 FINDINGS: Postop CABG. Negative for heart failure or edema. Improvement in by small bilateral pleural effusions. Negative for pneumonia. IMPRESSION: Lungs remain clear  without edema or pneumonia. Improvement in small bilateral effusions. Electronically Signed   By: Franchot Gallo M.D.   On: 01/11/2018 07:04   Dg Chest Port 1 View  Result Date: 01/05/2018 CLINICAL DATA:  PT was just d/c yesterday afternoon for acute on chronic copd. became short of breath shortly after leaving yesterday. Hx pneumonia/htn/ex smoker/hx heart cath EXAM: PORTABLE CHEST 1 VIEW COMPARISON:  CT chest 04/03/2015 FINDINGS: The lungs are hyperinflated likely secondary to COPD. There is mild bilateral interstitial thickening likely chronic. There is no pleural effusion or pneumothorax. The heart and mediastinal contours are unremarkable. There is dilatation of the aortic arch. The osseous structures are unremarkable. IMPRESSION: No active disease. Dilatation of the aortic arch. Electronically Signed   By: Kathreen Devoid   On: 01/05/2018 12:20   Dg Chest Port 1 View  Result Date: 12/31/2017 CLINICAL DATA:  Shortness of breath EXAM: PORTABLE CHEST 1 VIEW COMPARISON:  March 17, 2017 FINDINGS: Lungs are somewhat hyperexpanded. There is chronic blunting of the left costophrenic angle. There is no frank edema or consolidation. Heart size and pulmonary vascularity are normal. No adenopathy. Aorta is mildly prominent but stable. No adenopathy. There is evidence of old trauma involving each lateral clavicle. IMPRESSION: Lungs hyperexpanded with chronic blunting of the left costophrenic angle. No edema or consolidation. Stable cardiac silhouette. Aortic prominence is likely indicative of chronic hypertensive change. Electronically Signed   By: Lowella Grip III M.D.   On: 12/31/2017 12:39   Ct Angio Chest/abd/pel For Dissection W And/or W/wo  Result Date: 01/09/2018 CLINICAL DATA:  Acute drop in hemoglobin over the last 2 days with severe back pain and known chronic type B dissection involving the aortic arch, descending thoracic aorta and abdominal aorta. EXAM: CT ANGIOGRAPHY CHEST, ABDOMEN AND  PELVIS TECHNIQUE: Multidetector CT imaging through the chest, abdomen and pelvis was performed using the standard protocol during bolus administration of intravenous contrast. Multiplanar reconstructed images and MIPs were obtained and reviewed to evaluate the vascular anatomy. CONTRAST:  140mL ISOVUE-370 IOPAMIDOL (ISOVUE-370) INJECTION 76% COMPARISON:  Prior CTA of the chest on 01/05/2018. CTA of the chest, abdomen and pelvis on 04/24/2017. FINDINGS: CTA CHEST FINDINGS Cardiovascular: Stable appearance of chronic dissection beginning at the origin of the left subclavian artery and extending down the descending thoracic aorta. Maximum diameter of the distal arch is 4.5 cm which is stable. Maximum diameter of the descending thoracic aorta is approximately 4 cm and is stable. Proximal great vessels show stable and normal patency. There is stable irregular outpouching of the true lumen into the false lumen at the top of the aortic arch measuring roughly 2.7 cm  in greatest diameter. No evidence of acute hemorrhage in the chest. The heart size is stable. No pericardial fluid identified. The ascending thoracic aorta is normal in caliber and shows no evidence of dissection. Central pulmonary arteries are normal in caliber and well opacified demonstrating normal patency. Mediastinum/Nodes: No enlarged mediastinal, hilar, or axillary lymph nodes. Thyroid gland, trachea, and esophagus demonstrate no significant findings. Lungs/Pleura: Stable emphysematous lung disease. There is no evidence of pulmonary edema, consolidation, pneumothorax, nodule or pleural fluid. Musculoskeletal: Beginning in the lower right chest wall, there is evidence of hemorrhage anteriorly continuing into the upper abdominal wall. This is further described in the CTA abdomen report below. Review of the MIP images confirms the above findings. CTA ABDOMEN AND PELVIS FINDINGS VASCULAR Aorta: Stable evidence of dissection extending down the abdominal aorta  and into the proximal right common iliac artery. Stable narrowing of the proximal right common iliac artery. Stable aneurysmal dilatation of the abdominal aorta measuring approximately 4.6 x 4.7 cm. No evidence of aneurysm rupture. Celiac: . small caliber celiac trunk emanating off of the true lumen and otherwise demonstrating normal patency. SMA: SMA originates off of the true lumen and demonstrates normal patency. Renals: Bilateral renal arteries emanate off of the true lumen and demonstrate normal patency. 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 Inflow: Bilateral iliac artery patency stable with stable stenosis of the proximal right common iliac artery of approximately 60-70%. Stable small caliber bilateral external iliac arteries. Review of the MIP images confirms the above findings. NON-VASCULAR Hepatobiliary: No focal liver abnormality is seen. No gallstones, gallbladder wall thickening, or biliary dilatation. Pancreas: Unremarkable. No pancreatic ductal dilatation or surrounding inflammatory changes. Spleen: Normal in size without focal abnormality. Adrenals/Urinary Tract: Adrenal glands are unremarkable. Kidneys are normal, without renal calculi, focal lesion, or hydronephrosis. Bladder is unremarkable. Stomach/Bowel: No bowel obstruction, ileus or free air. No phlegm a tori process or mass identified. Lymphatic: No enlarged lymph nodes identified. Reproductive: Prostate is unremarkable. Other: There are 2 separate areas of large spontaneous hemorrhage. Large hematoma of the lower anterior right chest wall extends into the upper right rectus sheath. Largest dimensions of the area of hemorrhage are roughly 14.5 x 6.0 x 12.6 cm. There also is spontaneous hemorrhage in the right retroperitoneum involving the psoas muscle and extending laterally into the retroperitoneal space. Intramuscular psoas hemorrhage extends into the pelvis. Largest dimensions of the right retroperitoneal bleed are roughly 8.2 x 6.4  x 16 cm. Musculoskeletal: No acute or significant osseous findings. Review of the MIP images confirms the above findings. IMPRESSION: 1. No change in appearance of the chronic type B aortic dissection involving the distal arch, descending thoracic aorta and abdominal aorta. No evidence to suggest rupture or hemorrhage emanating from the aorta. 2. New spontaneous large hemorrhages involving the lower right anterior chest wall extending into the upper right rectus abdominal musculature and the right retroperitoneum involving the right iliopsoas muscle extending into the pelvis. 3. These results were called by telephone at the time of interpretation on 01/09/2018 at 11:15 am to Dr. Buford Dresser , who verbally acknowledged these results. Electronically Signed   By: Aletta Edouard M.D.   On: 01/09/2018 11:30   Ct Head Code Stroke Wo Contrast  Result Date: 01/08/2018 CLINICAL DATA:  Code stroke. EXAM: CT HEAD WITHOUT CONTRAST TECHNIQUE: Contiguous axial images were obtained from the base of the skull through the vertex without intravenous contrast. COMPARISON:  Head CT 05/11/2015 FINDINGS: Brain: There is no mass, hemorrhage or extra-axial collection.  There is generalized atrophy without lobar predilection. Areas of hypoattenuation of the deep gray nuclei and confluent periventricular white matter hypodensity, consistent with chronic small vessel disease. Vascular: Atherosclerotic calcification of the vertebral and internal carotid arteries at the skull base. No abnormal hyperdensity of the major intracranial arteries or dural venous sinuses. Skull: The visualized skull base, calvarium and extracranial soft tissues are normal. Sinuses/Orbits: No fluid levels or advanced mucosal thickening of the visualized paranasal sinuses. No mastoid or middle ear effusion. The orbits are normal. ASPECTS [facility] Stroke Program Early CT Score) - Ganglionic level infarction (caudate, lentiform nuclei, internal capsule,  insula, M1-M3 cortex): 7 - Supraganglionic infarction (M4-M6 cortex): 3 Total score (0-10 with 10 being normal): 10 IMPRESSION: 1. CLINICAL DATA:  Code stroke. EXAM: CT HEAD WITHOUT CONTRAST TECHNIQUE: Contiguous axial images were obtained from the base of the skull through the vertex without intravenous contrast. COMPARISON:  Head CT 05/11/2015 FINDINGS: Brain: There is no mass, hemorrhage or extra-axial collection. There is generalized atrophy without lobar predilection. Areas of hypoattenuation of the deep gray nuclei and confluent periventricular white matter hypodensity, consistent with chronic small vessel disease. Vascular: Atherosclerotic calcification of the vertebral and internal carotid arteries at the skull base. No abnormal hyperdensity of the major intracranial arteries or dural venous sinuses. Skull: The visualized skull base, calvarium and extracranial soft tissues are normal. Sinuses/Orbits: No fluid levels or advanced mucosal thickening of the visualized paranasal sinuses. No mastoid or middle ear effusion. The orbits are normal. ASPECTS [facility] Stroke Program Early CT Score) - Ganglionic level infarction (caudate, lentiform nuclei, internal capsule, insula, M1-M3 cortex): 7 - Supraganglionic infarction (M4-M6 cortex): 3 Total score (0-10 with 10 being normal): 10 IMPRESSION: 1. No acute hemorrhage or mass effect. 2. ASPECTS is 10. 3. Generalized atrophy and findings of chronic ischemic microangiopathy, including multiple old lacunar infarcts. These results were called by telephone at the time of interpretation on 01/08/2018 at 12:28 am to Dr. Derrill Kay , who verbally acknowledged these results. Electronically Signed   By: Ulyses Jarred M.D.   On: 01/08/2018 00:30    Lab Data:  CBC: Recent Labs  Lab 01/11/18 1749 01/12/18 0315 01/12/18 1511 01/13/18 0353 01/14/18 0427 01/15/18 0339 01/16/18 0524  WBC 19.0* 17.9*  --  19.8* 21.4* 19.4* 16.1*  NEUTROABS 16.9* 14.6*  --   --   --    --   --   HGB 8.2* 7.9* 9.8* 9.7* 10.3* 9.9* 9.4*  HCT 24.3* 24.4* 30.1* 29.3* 31.1* 30.3* 29.8*  MCV 92.7 92.8  --  93.3 94.5 94.1 96.8  PLT 176 175  --  209 227 221 956   Basic Metabolic Panel: Recent Labs  Lab 01/10/18 0546 01/11/18 0218 01/12/18 0315 01/13/18 0353 01/14/18 0427 01/15/18 0339 01/16/18 0524  NA 138 138 139 134* 137 134* 139  K 4.6 4.6 4.7 4.0 4.5 4.3 3.9  CL 99 100 101 94* 98 97* 98  CO2 31 30 28 31 31 26 30   GLUCOSE 117* 134* 112* 130* 110* 107* 97  BUN 56* 57* 40* 30* 24* 26* 25*  CREATININE 1.26* 1.13 0.93 0.97 0.98 0.93 0.80  CALCIUM 7.8* 7.8* 7.7* 8.0* 8.1* 8.1* 8.1*  MG 2.1 2.1 1.8 1.6*  --   --   --   PHOS 3.5 3.1 2.0* 2.4*  --   --   --    GFR: Estimated Creatinine Clearance: 91.1 mL/min (by C-G formula based on SCr of 0.8 mg/dL). Liver Function Tests: Recent Labs  Lab 01/15/18 0339  AST 31  ALT 26  ALKPHOS 35*  BILITOT 0.9  PROT 4.8*  ALBUMIN 2.5*   No results for input(s): LIPASE, AMYLASE in the last 168 hours. No results for input(s): AMMONIA in the last 168 hours. Coagulation Profile: No results for input(s): INR, PROTIME in the last 168 hours. Cardiac Enzymes: Recent Labs  Lab 01/10/18 1405 01/10/18 2013 01/11/18 0218 01/11/18 0822  TROPONINI 0.18* 0.12* 0.22* 0.22*   BNP (last 3 results) No results for input(s): PROBNP in the last 8760 hours. HbA1C: No results for input(s): HGBA1C in the last 72 hours. CBG: Recent Labs  Lab 01/15/18 2048 01/16/18 0111 01/16/18 0502 01/16/18 0723 01/16/18 1116  GLUCAP 159* 122* 104* 84 121*   Lipid Profile: No results for input(s): CHOL, HDL, LDLCALC, TRIG, CHOLHDL, LDLDIRECT in the last 72 hours. Thyroid Function Tests: No results for input(s): TSH, T4TOTAL, FREET4, T3FREE, THYROIDAB in the last 72 hours. Anemia Panel: No results for input(s): VITAMINB12, FOLATE, FERRITIN, TIBC, IRON, RETICCTPCT in the last 72 hours. Urine analysis:    Component Value Date/Time   COLORURINE  YELLOW 12/31/2017 2047   APPEARANCEUR CLEAR 12/31/2017 2047   LABSPEC 1.025 12/31/2017 2047   PHURINE 5.0 12/31/2017 2047   GLUCOSEU 250 (A) 12/31/2017 2047   HGBUR MODERATE (A) 12/31/2017 2047   BILIRUBINUR NEGATIVE 12/31/2017 2047   KETONESUR NEGATIVE 12/31/2017 2047   PROTEINUR 100 (A) 12/31/2017 2047   UROBILINOGEN 0.2 08/26/2012 0930   NITRITE NEGATIVE 12/31/2017 2047   LEUKOCYTESUR NEGATIVE 12/31/2017 2047       M.D. [facility] 01/16/2018, 1:58 PM  Pager: [phone] Between 7am to 7pm - call Pager - [phone]  After 7pm go to [email or web address] - password [email or web address]  Call night coverage person covering after 7pm

## 2018-01-17 LAB — GLUCOSE, CAPILLARY
GLUCOSE-CAPILLARY: 85 mg/dL (ref 70–99)
GLUCOSE-CAPILLARY: 115 mg/dL — AB (ref 70–99)
Glucose-Capillary: 101 mg/dL — ABNORMAL HIGH (ref 70–99)
Glucose-Capillary: 113 mg/dL — ABNORMAL HIGH (ref 70–99)
Glucose-Capillary: 122 mg/dL — ABNORMAL HIGH (ref 70–99)
Glucose-Capillary: 93 mg/dL (ref 70–99)

## 2018-01-17 NOTE — Discharge Summary (Signed)
Physician Discharge Summary  [name] WSF:[id number] DOB: 04/15/1950 DOA: 01/05/2018  PCP: Shelbie Ammons, MD  Admit date: 01/05/2018 Discharge date: 01/18/2018  Admitted From: Home Disposition:  Home  Recommendations for Outpatient Follow-up:  1. Follow up with PCP in 1-2 weeks 2. Please obtain BMP/CBC in one week   Home Health:yes  Discharge Condition:stable. CODE STATUS:full code.  Diet recommendation: Heart Healthy   Brief/Interim Summary: 67yo male with hx chronic hypoxic respiratory failure on 3-4L home O2 r/t COPD, CAD s/p CABG 2009 with known severe 3V disease on cath 2017, HTN, chronic type B aortic dissection with recent admission for AECOPD, re-admitted 12/16 to [facility] with chest pain and SOB. W/u neg for PE but did reveal elevated troponin (0.16, 0.12, 0.19) and some EKG changes and was therefore seen by cardiology, started on heparin gtt and ASA and tx to [facility] for tentative cardiac cath. On 12/20 developed severe back pain, hypotension with SBP 60's and AMS and CT abd revealed large retroperitoneal bleed.  Patient was transferred to ICU under PCCM care Patient transferred back to floor, [facility] service assumed care on 12/24   Discharge Diagnoses:  Active Problems:   HTN (hypertension)   CAD (coronary artery disease)   Hypercholesterolemia   Elevated troponin   Descending thoracic dissection (HCC)   COPD exacerbation (HCC)   Acute on chronic respiratory failure with hypoxia (HCC)   Anxiety   Ischemic chest pain (HCC)   Other chest pain   Hemorrhagic shock with hypotension in the setting of retroperitoneal/ chest wall bleed, acute blood loss anemia -Goal hemoglobin 9-10 with NSTEMI, has received RBC transfusions -Repeat CT abdomen pelvis showed significant reduction in the hematomas involving the right lower anterior chest wall extending into the right rectus sheath and the posterior right retroperitoneum, no new hemorrhage. -H&H  stable  NSTEMI -Patient had initially presented with worsening dyspnea, elevated troponins, new T wave inversions in lateral leads -2D echo showed EF of 55 to 60%, patient was started on heparin drip. -Initial plans were for left heart cath however patient developed retroperitoneal bleed, IV heparin and aspirin were discontinued.  Cardiology now recommends medical therapy not a candidate for interventional procedures. -H&H stable, goal 9-10  Acute on chronic hypoxic respiratory failure with COPD exacerbation -Slowly improving, O2 sats 98% on 3 L currently, wean O2 as tolerated  -Continue O2, bronchodilators, prednisone, PPI   Leukocytosis Possibly due to #1 and steroids.,  No fevers or chills or infectious etiology.  Chronic low back pain -Continue Percocet as needed  Known chronic type B aortic dissection chronic and stable, cardiology following.  BP control, continue Lopressor 25 mg twice a day  Essential hypertension BP controlled  Anxiety: Patient has significant anxiety issues regarding going home. Placed on low dose klonopin.    Discharge Instructions  Discharge Instructions    AMB Referral to [facility]   Complete by:  As directed    Please refer to community RN for complex case management services. Please assign to pharmacist for medication management. Patient states that he is being prescribed some different medications upon discharge home. PCP: Sadie Haber @ [facility] T[facility]s.  For questions, please contact:   Netta Cedars, MSN, RN [facility] Liaison Nurse Mobile Phone [phone]   Reason for consult:  Post hospital follow up   Diagnoses of:  COPD/ Pneumonia   Expected date of contact:  1-3 days (reserved for hospital discharges)     Allergies as of 01/17/2018  Reactions   Nsaids Shortness Of Breath   Bee Venom Swelling   Neck swelling, passes out   Codeine Other (See Comments)   Causes GI Upset      Medication List     STOP taking these medications   aspirin 81 MG chewable tablet   oxyCODONE-acetaminophen 5-325 MG tablet Commonly known as:  PERCOCET/ROXICET   predniSONE 20 MG tablet Commonly known as:  DELTASONE     TAKE these medications   ALPRAZolam 0.25 MG tablet Commonly known as:  XANAX Take 1 tablet (0.25 mg total) by mouth 2 (two) times daily as needed for anxiety.   atorvastatin 20 MG tablet Commonly known as:  LIPITOR Take 20 mg by mouth daily.   benzonatate 200 MG capsule Commonly known as:  TESSALON Take 1 capsule (200 mg total) by mouth 3 (three) times daily as needed for cough.   busPIRone 7.5 MG tablet Commonly known as:  BUSPAR Take 1 tablet (7.5 mg total) by mouth 3 (three) times daily.   feeding supplement (ENSURE ENLIVE) Liqd Take 237 mLs by mouth 2 (two) times daily between meals.   gabapentin 300 MG capsule Commonly known as:  NEURONTIN Take 300 mg by mouth 3 (three) times daily.   levothyroxine 50 MCG tablet Commonly known as:  SYNTHROID, LEVOTHROID Take 50 mcg by mouth daily.   metoprolol tartrate 25 MG tablet Commonly known as:  LOPRESSOR Take 1 tablet (25 mg total) by mouth 2 (two) times daily.   nicotine 21 mg/24hr patch Commonly known as:  NICODERM CQ - dosed in mg/24 hours Place 1 patch (21 mg total) onto the skin daily.   OXYGEN Inhale 3.5 L into the lungs daily as needed (for shortness of breath). 3-3.5lpm 24/7   pantoprazole 40 MG tablet Commonly known as:  PROTONIX Take 1 tablet (40 mg total) by mouth at bedtime.   traZODone 100 MG tablet Commonly known as:  DESYREL Take 1 tablet (100 mg total) by mouth at bedtime as needed for sleep.   TRELEGY ELLIPTA 100-62.5-25 MCG/INH Aepb Generic drug:  Fluticasone-Umeclidin-Vilant Inhale 1 puff into the lungs daily.   VENTOLIN HFA 108 (90 Base) MCG/ACT inhaler Generic drug:  albuterol Inhale 2 puffs into the lungs every 6 (six) hours as needed for wheezing or shortness of breath.   albuterol  (2.5 MG/3ML) 0.083% nebulizer solution Commonly known as:  PROVENTIL Take 3 mLs (2.5 mg total) by nebulization every 6 (six) hours as needed for wheezing or shortness of breath.      Follow-up Information    Shelbie Ammons, MD. Schedule an appointment as soon as possible for a visit in 1 week(s).   Specialty:  Internal Medicine       Jerline Pain, MD .   Specialty:  Cardiology Contact information: [address] [place] [address] [phone]          Allergies  Allergen Reactions  . Nsaids Shortness Of Breath  . Bee Venom Swelling    Neck swelling, passes out  . Codeine Other (See Comments)    Causes GI Upset    Consultations:  cardiology   Procedures/Studies: Ct Abdomen Pelvis Wo Contrast  Result Date: 01/14/2018 CLINICAL DATA:  Recent chest wall/abdominal wall and retroperitoneal hemorrhage. Worsening abdominal pain. EXAM: CT ABDOMEN AND PELVIS WITHOUT CONTRAST TECHNIQUE: Multidetector CT imaging of the abdomen and pelvis was performed following the standard protocol without IV contrast. COMPARISON:  CTA of the chest, abdomen and pelvis on 01/09/2018 FINDINGS:  Lower chest: The hemorrhage involving the right anterior lower chest wall and extending into the upper right rectus sheath shows substantial improvement since the prior CT. Length of area of residual hemorrhage measures approximately 12 cm and the hematoma now measures only 3 cm in thickness compared to approximately 6 cm previously. Component extending into the right rectus sheath has nearly completely resolved. There is a new small right pleural effusion. Hepatobiliary: No focal liver abnormality is seen. No gallstones, gallbladder wall thickening, or biliary dilatation. Pancreas: Unremarkable. No pancreatic ductal dilatation or surrounding inflammatory changes. Spleen: Normal in size without focal abnormality. Adrenals/Urinary Tract: No hydronephrosis. Continued anterior displacement of  right kidney due to posterior right retroperitoneal hemorrhage. Stomach/Bowel: No evidence of bowel obstruction, ileus or free air. Vascular/Lymphatic: No enlarged lymph nodes identified. The right posterior retroperitoneal hemorrhage abutting the psoas muscle shows decrease in size and now measures approximately 6.5 cm in greatest diameter compared to just over 8 cm. There also is less hemorrhage tracking along the ileus psoas muscle into the right pelvis. No new hemorrhage identified. Stable abdominal aortic aneurysm measuring approximately 4.7 cm in greatest diameter. Reproductive: Prostate is unremarkable. Other: No hernias identified. Musculoskeletal: No acute or significant osseous findings. IMPRESSION: Significant reduction in hematomas involving the right lower anterior chest wall extending into the right rectus sheath and the posterior right retroperitoneum. Both areas of hemorrhage show significant decrease in size, especially the anterior chest/abdominal wall hemorrhage. No new hemorrhage identified. Electronically Signed   By: Aletta Edouard M.D.   On: 01/14/2018 14:08   Ct Angio Chest Pe W And/or Wo Contrast  Result Date: 01/05/2018 CLINICAL DATA:  Increased shortness of breath since discharge from the hospital on 01/04/2018. EXAM: CT ANGIOGRAPHY CHEST WITH CONTRAST TECHNIQUE: Multidetector CT imaging of the chest was performed using the standard protocol during bolus administration of intravenous contrast. Multiplanar CT image reconstructions and MIPs were obtained to evaluate the vascular anatomy. CONTRAST:  167mL ISOVUE-370 IOPAMIDOL (ISOVUE-370) INJECTION 76% COMPARISON:  Formal chest obtained earlier today and chest CTA dated 04/03/2015. FINDINGS: Cardiovascular: Previously demonstrated Stanford type B dissection of the aorta beginning just beyond the origin of the left subclavian artery and extending into the upper abdomen to the level of the origin of the renal arteries with extension into  the proximal left renal artery, causing approximately 90+% stenosis of the proximal left renal artery. There has been interval thrombosis of the false lumen of the dissection with the exception of a small amount of persistent opacified blood in the aortic arch superiorly on the left. No significant narrowing of the true lumen of the aorta is seen. Stable post CABG changes. Atheromatous calcifications, including the coronary arteries and aorta. Mediastinum/Nodes: No enlarged mediastinal, hilar, or axillary lymph nodes. Thyroid gland, trachea, and esophagus demonstrate no significant findings. Lungs/Pleura: The lungs remain mildly hyperexpanded with mild bullous changes. Minimal residual atelectasis or scarring at the right lung base posteriorly. No pleural fluid. Upper Abdomen: Aortic dissection extending into the proximal left renal artery, as described above. Musculoskeletal: Motion artifacts making it difficult to assess for rib and sternal fractures. Thoracic spine degenerative changes and mild scoliosis. Lower cervical spine degenerative changes. Review of the MIP images confirms the above findings. IMPRESSION: 1. 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 2. Interval thrombosis of  the false lumen of the dissection with the exception of a small amount of persistent opacified blood in the aortic arch superiorly on the left. 3. Mild changes of COPD. 4. Calcific coronary artery and aortic atherosclerosis and post CABG changes. Aortic Atherosclerosis (ICD10-I70.0) and Emphysema (ICD10-J43.9). Electronically Signed   By: Claudie Revering M.D.   On: 01/05/2018 15:40   Dg Chest Port 1 View  Result Date: 01/12/2018 CLINICAL DATA:  Abnormal respiration. EXAM: PORTABLE CHEST 1 VIEW  COMPARISON:  01/11/2018. FINDINGS: Prior CABG. Heart size stable. No pulmonary venous congestion. Mild left base subsegmental atelectasis. Tiny stable bilateral pleural effusions. No pneumothorax. IMPRESSION: 1.  Prior CABG.  Heart size stable.  No pulmonary venous congestion. 2. Mild left base subsegmental axis. Tiny bilateral pleural effusions again noted. No interim change. Electronically Signed   By: Marcello Moores  Register   On: 01/12/2018 06:47   Dg Chest Port 1 View  Result Date: 01/11/2018 CLINICAL DATA:  Acute respiratory failure EXAM: PORTABLE CHEST 1 VIEW COMPARISON:  01/05/2018 FINDINGS: Postop CABG. Negative for heart failure or edema. Improvement in by small bilateral pleural effusions. Negative for pneumonia. IMPRESSION: Lungs remain clear without edema or pneumonia. Improvement in small bilateral effusions. Electronically Signed   By: Franchot Gallo M.D.   On: 01/11/2018 07:04   Dg Chest Port 1 View  Result Date: 01/05/2018 CLINICAL DATA:  PT was just d/c yesterday afternoon for acute on chronic copd. became short of breath shortly after leaving yesterday. Hx pneumonia/htn/ex smoker/hx heart cath EXAM: PORTABLE CHEST 1 VIEW COMPARISON:  CT chest 04/03/2015 FINDINGS: The lungs are hyperinflated likely secondary to COPD. There is mild bilateral interstitial thickening likely chronic. There is no pleural effusion or pneumothorax. The heart and mediastinal contours are unremarkable. There is dilatation of the aortic arch. The osseous structures are unremarkable. IMPRESSION: No active disease. Dilatation of the aortic arch. Electronically Signed   By: Kathreen Devoid   On: 01/05/2018 12:20   Dg Chest Port 1 View  Result Date: 12/31/2017 CLINICAL DATA:  Shortness of breath EXAM: PORTABLE CHEST 1 VIEW COMPARISON:  March 17, 2017 FINDINGS: Lungs are somewhat hyperexpanded. There is chronic blunting of the left costophrenic angle. There is no frank edema or consolidation. Heart size and pulmonary  vascularity are normal. No adenopathy. Aorta is mildly prominent but stable. No adenopathy. There is evidence of old trauma involving each lateral clavicle. IMPRESSION: Lungs hyperexpanded with chronic blunting of the left costophrenic angle. No edema or consolidation. Stable cardiac silhouette. Aortic prominence is likely indicative of chronic hypertensive change. Electronically Signed   By: Lowella Grip III M.D.   On: 12/31/2017 12:39   Ct Angio Chest/abd/pel For Dissection W And/or W/wo  Result Date: 01/09/2018 CLINICAL DATA:  Acute drop in hemoglobin over the last 2 days with severe back pain and known chronic type B dissection involving the aortic arch, descending thoracic aorta and abdominal aorta. EXAM: CT ANGIOGRAPHY CHEST, ABDOMEN AND PELVIS TECHNIQUE: Multidetector CT imaging through the chest, abdomen and pelvis was performed using the standard protocol during bolus administration of intravenous contrast. Multiplanar reconstructed images and MIPs were obtained and reviewed to evaluate the vascular anatomy. CONTRAST:  158mL ISOVUE-370 IOPAMIDOL (ISOVUE-370) INJECTION 76% COMPARISON:  Prior CTA of the chest on 01/05/2018. CTA of the chest, abdomen and pelvis on 04/24/2017. FINDINGS: CTA CHEST FINDINGS Cardiovascular: Stable appearance of chronic dissection beginning at the origin of the left subclavian artery and extending down the descending thoracic aorta. Maximum diameter of the distal arch is  4.5 cm which is stable. Maximum diameter of the descending thoracic aorta is approximately 4 cm and is stable. Proximal great vessels show stable and normal patency. There is stable irregular outpouching of the true lumen into the false lumen at the top of the aortic arch measuring roughly 2.7 cm in greatest diameter. No evidence of acute hemorrhage in the chest. The heart size is stable. No pericardial fluid identified. The ascending thoracic aorta is normal in caliber and shows no evidence of  dissection. Central pulmonary arteries are normal in caliber and well opacified demonstrating normal patency. Mediastinum/Nodes: No enlarged mediastinal, hilar, or axillary lymph nodes. Thyroid gland, trachea, and esophagus demonstrate no significant findings. Lungs/Pleura: Stable emphysematous lung disease. There is no evidence of pulmonary edema, consolidation, pneumothorax, nodule or pleural fluid. Musculoskeletal: Beginning in the lower right chest wall, there is evidence of hemorrhage anteriorly continuing into the upper abdominal wall. This is further described in the CTA abdomen report below. Review of the MIP images confirms the above findings. CTA ABDOMEN AND PELVIS FINDINGS VASCULAR Aorta: Stable evidence of dissection extending down the abdominal aorta and into the proximal right common iliac artery. Stable narrowing of the proximal right common iliac artery. Stable aneurysmal dilatation of the abdominal aorta measuring approximately 4.6 x 4.7 cm. No evidence of aneurysm rupture. Celiac: . small caliber celiac trunk emanating off of the true lumen and otherwise demonstrating normal patency. SMA: SMA originates off of the true lumen and demonstrates normal patency. Renals: Bilateral renal arteries emanate off of the true lumen and demonstrate normal patency. 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 Inflow: Bilateral iliac artery patency stable with stable stenosis of the proximal right common iliac artery of approximately 60-70%. Stable small caliber bilateral external iliac arteries. Review of the MIP images confirms the above findings. NON-VASCULAR Hepatobiliary: No focal liver abnormality is seen. No gallstones, gallbladder wall thickening, or biliary dilatation. Pancreas: Unremarkable. No pancreatic ductal dilatation or surrounding inflammatory changes. Spleen: Normal in size without focal abnormality. Adrenals/Urinary Tract: Adrenal glands are unremarkable. Kidneys are normal, without renal  calculi, focal lesion, or hydronephrosis. Bladder is unremarkable. Stomach/Bowel: No bowel obstruction, ileus or free air. No phlegm a tori process or mass identified. Lymphatic: No enlarged lymph nodes identified. Reproductive: Prostate is unremarkable. Other: There are 2 separate areas of large spontaneous hemorrhage. Large hematoma of the lower anterior right chest wall extends into the upper right rectus sheath. Largest dimensions of the area of hemorrhage are roughly 14.5 x 6.0 x 12.6 cm. There also is spontaneous hemorrhage in the right retroperitoneum involving the psoas muscle and extending laterally into the retroperitoneal space. Intramuscular psoas hemorrhage extends into the pelvis. Largest dimensions of the right retroperitoneal bleed are roughly 8.2 x 6.4 x 16 cm. Musculoskeletal: No acute or significant osseous findings. Review of the MIP images confirms the above findings. IMPRESSION: 1. No change in appearance of the chronic type B aortic dissection involving the distal arch, descending thoracic aorta and abdominal aorta. No evidence to suggest rupture or hemorrhage emanating from the aorta. 2. New spontaneous large hemorrhages involving the lower right anterior chest wall extending into the upper right rectus abdominal musculature and the right retroperitoneum involving the right iliopsoas muscle extending into the pelvis. 3. These results were called by telephone at the time of interpretation on 01/09/2018 at 11:15 am to Dr. Buford Dresser , who verbally acknowledged these results. Electronically Signed   By: Aletta Edouard M.D.   On: 01/09/2018 11:30   Ct Head  Code Stroke Wo Contrast  Result Date: 01/08/2018 CLINICAL DATA:  Code stroke. EXAM: CT HEAD WITHOUT CONTRAST TECHNIQUE: Contiguous axial images were obtained from the base of the skull through the vertex without intravenous contrast. COMPARISON:  Head CT 05/11/2015 FINDINGS: Brain: There is no mass, hemorrhage or extra-axial  collection. There is generalized atrophy without lobar predilection. Areas of hypoattenuation of the deep gray nuclei and confluent periventricular white matter hypodensity, consistent with chronic small vessel disease. Vascular: Atherosclerotic calcification of the vertebral and internal carotid arteries at the skull base. No abnormal hyperdensity of the major intracranial arteries or dural venous sinuses. Skull: The visualized skull base, calvarium and extracranial soft tissues are normal. Sinuses/Orbits: No fluid levels or advanced mucosal thickening of the visualized paranasal sinuses. No mastoid or middle ear effusion. The orbits are normal. ASPECTS [facility] Stroke Program Early CT Score) - Ganglionic level infarction (caudate, lentiform nuclei, internal capsule, insula, M1-M3 cortex): 7 - Supraganglionic infarction (M4-M6 cortex): 3 Total score (0-10 with 10 being normal): 10 IMPRESSION: 1. CLINICAL DATA:  Code stroke. EXAM: CT HEAD WITHOUT CONTRAST TECHNIQUE: Contiguous axial images were obtained from the base of the skull through the vertex without intravenous contrast. COMPARISON:  Head CT 05/11/2015 FINDINGS: Brain: There is no mass, hemorrhage or extra-axial collection. There is generalized atrophy without lobar predilection. Areas of hypoattenuation of the deep gray nuclei and confluent periventricular white matter hypodensity, consistent with chronic small vessel disease. Vascular: Atherosclerotic calcification of the vertebral and internal carotid arteries at the skull base. No abnormal hyperdensity of the major intracranial arteries or dural venous sinuses. Skull: The visualized skull base, calvarium and extracranial soft tissues are normal. Sinuses/Orbits: No fluid levels or advanced mucosal thickening of the visualized paranasal sinuses. No mastoid or middle ear effusion. The orbits are normal. ASPECTS [facility] Stroke Program Early CT Score) - Ganglionic level infarction (caudate, lentiform nuclei,  internal capsule, insula, M1-M3 cortex): 7 - Supraganglionic infarction (M4-M6 cortex): 3 Total score (0-10 with 10 being normal): 10 IMPRESSION: 1. No acute hemorrhage or mass effect. 2. ASPECTS is 10. 3. Generalized atrophy and findings of chronic ischemic microangiopathy, including multiple old lacunar infarcts. These results were called by telephone at the time of interpretation on 01/08/2018 at 12:28 am to Dr. Derrill Kay , who verbally acknowledged these results. Electronically Signed   By: Ulyses Jarred M.D.   On: 01/08/2018 00:30      Subjective: Wants to go home tomorrow.   Discharge Exam: Vitals:   01/17/18 0854 01/17/18 1257  BP:  (!) 110/97  Pulse:  72  Resp:    Temp:  97.8 F (36.6 C)  SpO2: 99% 93%   Vitals:   01/17/18 0422 01/17/18 0831 01/17/18 0854 01/17/18 1257  BP: 120/67 (!) 112/56  (!) 110/97  Pulse: 68 76  72  Resp: 18     Temp: 97.8 F (36.6 C)   97.8 F (36.6 C)  TempSrc: Oral   Oral  SpO2: 94%  99% 93%  Weight:      Height:        General: Pt is alert, awake, not in acute distress Cardiovascular: RRR, S1/S2 +, no rubs, no gallops Respiratory: CTA bilaterally, no wheezing, no rhonchi Abdominal: Soft, NT, ND, bowel sounds + Extremities: no edema, no cyanosis    The results of significant diagnostics from this hospitalization (including imaging, microbiology, ancillary and laboratory) are listed below for reference.     Microbiology: No results found for this or any previous visit (from the  past 240 hour(s)).   Labs: BNP (last 3 results) Recent Labs    01/05/18 1226  BNP 633.3*   Basic Metabolic Panel: Recent Labs  Lab 01/11/18 0218 01/12/18 0315 01/13/18 0353 01/14/18 0427 01/15/18 0339 01/16/18 0524  NA 138 139 134* 137 134* 139  K 4.6 4.7 4.0 4.5 4.3 3.9  CL 100 101 94* 98 97* 98  CO2 30 28 31 31 26 30   GLUCOSE 134* 112* 130* 110* 107* 97  BUN 57* 40* 30* 24* 26* 25*  CREATININE 1.13 0.93 0.97 0.98 0.93 0.80  CALCIUM 7.8*  7.7* 8.0* 8.1* 8.1* 8.1*  MG 2.1 1.8 1.6*  --   --   --   PHOS 3.1 2.0* 2.4*  --   --   --    Liver Function Tests: Recent Labs  Lab 01/15/18 0339  AST 31  ALT 26  ALKPHOS 35*  BILITOT 0.9  PROT 4.8*  ALBUMIN 2.5*   No results for input(s): LIPASE, AMYLASE in the last 168 hours. No results for input(s): AMMONIA in the last 168 hours. CBC: Recent Labs  Lab 01/11/18 1749 01/12/18 0315 01/12/18 1511 01/13/18 0353 01/14/18 0427 01/15/18 0339 01/16/18 0524  WBC 19.0* 17.9*  --  19.8* 21.4* 19.4* 16.1*  NEUTROABS 16.9* 14.6*  --   --   --   --   --   HGB 8.2* 7.9* 9.8* 9.7* 10.3* 9.9* 9.4*  HCT 24.3* 24.4* 30.1* 29.3* 31.1* 30.3* 29.8*  MCV 92.7 92.8  --  93.3 94.5 94.1 96.8  PLT 176 175  --  209 227 221 212   Cardiac Enzymes: Recent Labs  Lab 01/10/18 2013 01/11/18 0218 01/11/18 0822  TROPONINI 0.12* 0.22* 0.22*   BNP: Invalid input(s): POCBNP CBG: Recent Labs  Lab 01/16/18 2027 01/17/18 0037 01/17/18 0420 01/17/18 0743 01/17/18 1100  GLUCAP 132* 93 113* 85 101*   D-Dimer No results for input(s): DDIMER in the last 72 hours. Hgb A1c No results for input(s): HGBA1C in the last 72 hours. Lipid Profile No results for input(s): CHOL, HDL, LDLCALC, TRIG, CHOLHDL, LDLDIRECT in the last 72 hours. Thyroid function studies No results for input(s): TSH, T4TOTAL, T3FREE, THYROIDAB in the last 72 hours.  Invalid input(s): FREET3 Anemia work up No results for input(s): VITAMINB12, FOLATE, FERRITIN, TIBC, IRON, RETICCTPCT in the last 72 hours. Urinalysis    Component Value Date/Time   COLORURINE YELLOW 12/31/2017 2047   APPEARANCEUR CLEAR 12/31/2017 2047   LABSPEC 1.025 12/31/2017 2047   PHURINE 5.0 12/31/2017 2047   GLUCOSEU 250 (A) 12/31/2017 2047   HGBUR MODERATE (A) 12/31/2017 2047   BILIRUBINUR NEGATIVE 12/31/2017 2047   KETONESUR NEGATIVE 12/31/2017 2047   PROTEINUR 100 (A) 12/31/2017 2047   UROBILINOGEN 0.2 08/26/2012 0930   NITRITE NEGATIVE  12/31/2017 2047   LEUKOCYTESUR NEGATIVE 12/31/2017 2047   Sepsis Labs Invalid input(s): PROCALCITONIN,  WBC,  LACTICIDVEN Microbiology No results found for this or any previous visit (from the past 240 hour(s)).   Time coordinating discharge: 38 minutes  SIGNED:   Hosie Poisson, MD  [facility] 01/17/2018, 3:17 PM Pager   If 7PM-7AM, please contact night-coverage [email or web address] Password [email or web address]

## 2018-01-18 LAB — GLUCOSE, CAPILLARY: GLUCOSE-CAPILLARY: 77 mg/dL (ref 70–99)

## 2018-01-18 MED ORDER — OXYCODONE-ACETAMINOPHEN 5-325 MG PO TABS: 1.0000 | ORAL_TABLET | Freq: Three times a day (TID) | ORAL | 0 refills | Status: AC | PRN

## 2018-01-18 MED ORDER — DM-GUAIFENESIN ER 30-600 MG PO TB12: 1.0000 | ORAL_TABLET | Freq: Two times a day (BID) | ORAL | 0 refills | Status: DC

## 2018-01-18 NOTE — Plan of Care (Signed)
  Problem: Clinical Measurements: Goal: Respiratory complications will improve Outcome: Adequate for Discharge   Problem: Clinical Measurements: Goal: Ability to maintain clinical measurements within normal limits will improve Outcome: Adequate for Discharge   Problem: Clinical Measurements: Goal: Cardiovascular complication will be avoided Outcome: Adequate for Discharge   Problem: Pain Managment: Goal: General experience of comfort will improve Outcome: Adequate for Discharge

## 2018-01-19 ENCOUNTER — Other Ambulatory Visit: Payer: Self-pay

## 2018-01-19 NOTE — Patient Outreach (Signed)
[place] [facility]) Care Management  [place]  01/19/2018  WILLIM TURNAGE [birth year] [id number]  Reason for call: medication management outreach  Unsuccessful telephone call attempt # 1 to patient.   Unable to leave message as call party was unavailable.   Plan:  I will make another outreach attempt to patient within 3-4 business days.  Joetta Manners, PharmD Clinical Pharmacist [place] [phone]

## 2018-01-20 ENCOUNTER — Other Ambulatory Visit: Payer: Self-pay | Admitting: *Deleted

## 2018-01-20 NOTE — Patient Outreach (Signed)
Telephone call to pt for screening, referral received from hospital liason, Primary MD office completes transition of care, pt hospitalized 12/16-12/29/19 with COPD exacerbation/ hypoxia, retroperitoneal/ chest wall bleed/ anemia, HTN, CAD, outreach to [phone] with no answer and received message "call party temporarily unavailable",  RN CM mailed unsuccessful outreach letter to patient's home.  PLAN Outreach pt in 3-4 days  Jacqlyn Larsen [facility], [place] Coordinator [phone]

## 2018-01-22 ENCOUNTER — Other Ambulatory Visit: Payer: Self-pay

## 2018-01-22 ENCOUNTER — Ambulatory Visit: Payer: Self-pay

## 2018-01-22 ENCOUNTER — Other Ambulatory Visit: Payer: Self-pay | Admitting: *Deleted

## 2018-01-22 NOTE — Patient Outreach (Addendum)
[place] [facility]) Care Management  01/22/2018  [name] [birth year] [id number]   Successful outreach call to [name] wife, [name].  HIPAA identifiers verified.  Wife states that [name] phone number is not working at this time.  Informed her that a [facility] CM nurse had tried to contact them using [name] contact information. She stated that they had been waiting on a nurse to call.   Patient was unavailable to speak with me, so I left my phone number with wife and requested that she have [name] return my call.    Plan: Inform [facility] CM RN, Jacqlyn Larsen that she will need to use wife's phone number to contact [name].  Will make outreach attempt # 2 if 3-4 business days, if no response from [name].   Joetta Manners, PharmD Clinical Pharmacist [place] [phone]

## 2018-01-22 NOTE — Patient Outreach (Signed)
Second attempt outreach call to pt for screening, referral received hospital liason, diagnosis COPD, HTN, CAD, retroperitoneal/ chest wall bleed, anemia, anxiety. Primary MD office provides transition of care, [facility] pharmacist relayed message to speak with pt wife Patryk Conant at [phone], permission given by pt, spoke with Terri Piedra who reports pt is talking with someone at their home and she could answer questions over the phone.  Partial screening completed due to only speaking to wife, Wife states pt is on oxygen at 4 liters and uses approximately 20 hours per day, pt has dyspnea with exertion, Wife seems to think pt had home health before his hospitalization- [facility] as pt previously verbalized he had RN and Education officer, [employer], RN CM offered to complete home visit tomorrow 01/23/17 and wife declined but agreeable to home visit next week, pt to see primary care MD on 01/27/17,  RN CM gave 24 hour nurse line number, called [place], spoke with Anderson Malta x [phone] and she is checking into home health orders, she states looks like they did receive orders and pt went back in hospital, she will check on orders and call RN CM back.  RN CM sent in basket to [facility] and ask which agency was ordered for pt upon discharge as the notes are not clear.  RN CM reviewed action plan and resources to call, reminded wife that [facility] has walk in clinic if pt needs to be seen before next week, reviewed going to ED for emergency. Anderson Malta RN from [place] called back and states [name] would not approve home healthy orders.  RN CM faxed note to primary MD asking if MD wants home health to please send orders.  [facility] CM Care Plan Problem One     Most Recent Value  Care Plan Problem One  Knowledge deficit related to COPD  Role Documenting the Problem One  Care Management Coordinator  Care Plan for Problem One  Active  THN Long Term Goal   Pt will verbalize/ demonstrate improvement for self  care with COPD within 60 days  THN Long Term Goal Start Date  01/22/18  Interventions for Problem One Long Term Goal  RN CM reviewed upcoming appointments, completed screening, gave 24 hour nurse line number to wife, reviewed action plan and resources to call  [facility] CM Short Term Goal #1   Pt will verbalize COPD action plan/ zones within  30 days  [facility] CM Short Term Goal #1 Start Date  01/22/18  Interventions for Short Term Goal #1  RN CM reviewed COPD action plan with emphasis on yellow zone      PLAN See pt for initial home visit next week.  Jacqlyn Larsen [facility], [place] Coordinator [phone]

## 2018-01-22 NOTE — Patient Outreach (Signed)
[place] [facility]) Care Management  [place]   01/22/2018  CHESKY HEYER 25-Aug-1950 [id number]   Successful outreach call to Mr. Staiger wife, Terri Piedra.  HIPAA identifiers verified.  Wife states that Mr. Haran phone number is not working at this time.  Informed her that a [facility] CM nurse had tried to contact them using Mr. Maselli contact information. She stated that they had been waiting on a nurse to call.   Patient was unavailable to speak with me, so I left my phone number with wife and requested that she have Mr. Belluomini return my call.    Plan: Inform [facility] CM RN, Jacqlyn Larsen that she will need to use wife's phone number to contact Mr. Pienta.  Will make outreach attempt # 2 if 3-4 business days, if no response from Mr. Cadenhead.   Joetta Manners, PharmD Clinical Pharmacist [place] [phone]  Addendum: Incoming call received from Mr. Boivin.  HIPAA identifiers verified.   Reason for referral: medication management -"prescribed some different medications at discharge" Referral source: [place] Liaison Current insurance:[employer]  PMHx includes but not limited to:   Aortic dissection, hypertension, coronary artery disease, NSTEMI, COPD, GERD, anxiety, hypercholesterolemia and h/o retroperitoneal bleed  Outreach:  Successful telephone call with Mr. Belcastro.  HIPAA identifiers verified.   Subjective:  Mr. Wohl reports that he cannot see his PCP until 01/27/18 and he states "I will run out of my medications by next Monday."  Patient recently hospitalized for acute exacerbation of COPD, NSTEMI and a large retroperitoneal bleed.  He reports that he has flat bruises on various parts of his body and that he cannot get comfortable laying or sitting and therefore has not been sleeping well.   He states that his feet "are so swollen that I have to wear my flip flops."  He states that his pedal edema is new. Patient refuses to go to ED or to [facility] walk-in clinic.  He  states, "They aint going to do nothing for me, except push me off somewhere else."  Objective: Lab Results  Component Value Date   CREATININE 0.80 01/16/2018   CREATININE 0.93 01/15/2018   CREATININE 0.98 01/14/2018    Lab Results  Component Value Date   HGBA1C 5.3 11/16/2015    Lipid Panel     Component Value Date/Time   CHOL 204 (H) 03/15/2015 0558   TRIG 116 04/09/2015 0802   HDL 29 (L) 03/15/2015 0558   CHOLHDL 7.0 03/15/2015 0558   VLDL 26 03/15/2015 0558   LDLCALC 149 (H) 03/15/2015 0558    BP Readings from Last 3 Encounters:  01/18/18 121/82  01/04/18 130/81  05/27/17 (!) 165/94    Allergies  Allergen Reactions  . Nsaids Shortness Of Breath  . Bee Venom Swelling    Neck swelling, passes out  . Codeine Other (See Comments)    Causes GI Upset    Medications Reviewed Today    Reviewed by Dionne Milo, [facility] (Pharmacist) on 01/22/18 at 1521  Med List Status: <None>  Medication Order Taking? Sig Documenting Provider Last Dose Status Informant  albuterol (PROVENTIL) (2.5 MG/3ML) 0.083% nebulizer solution [id number] Yes Take 3 mLs (2.5 mg total) by nebulization every 6 (six) hours as needed for wheezing or shortness of breath. Barton Dubois, MD Taking Active Self  albuterol (VENTOLIN HFA) 108 (90 Base) MCG/ACT inhaler [id number] Yes Inhale 2 puffs into the lungs every 6 (six) hours as needed for wheezing or shortness of breath. [provider] Taking Active Self  ALPRAZolam (XANAX) 0.25 MG tablet [id number] Yes Take 1 tablet (0.25 mg total) by mouth 2 (two) times daily as needed for anxiety. Barton Dubois, MD Taking Active Self  atorvastatin (LIPITOR) 20 MG tablet [id number] Yes Take 20 mg by mouth daily. [provider] Taking Active Self  benzonatate (TESSALON) 200 MG capsule [id number] Yes Take 1 capsule (200 mg total) by mouth 3 (three) times daily as needed for cough. Barton Dubois, MD Taking Active Self  busPIRone (BUSPAR) 7.5 MG tablet  [id number] Yes Take 1 tablet (7.5 mg total) by mouth 3 (three) times daily. Barton Dubois, MD Taking Active Self           Med Note (Amando Ishikawa, [name] Jan 22, 2018  2:38 PM)    dextromethorphan-guaiFENesin [facility] DM) 30-600 MG 12hr tablet [id number] Yes Take 1 tablet by mouth 2 (two) times daily. Hosie Poisson, MD Taking Active   feeding supplement, ENSURE ENLIVE, (ENSURE ENLIVE) LIQD [id number] Yes Take 237 mLs by mouth 2 (two) times daily between meals. Barton Dubois, MD Taking Active Self  Fluticasone-Umeclidin-Vilant (TRELEGY ELLIPTA) 100-62.5-25 MCG/INH AEPB [id number] Yes Inhale 1 puff into the lungs daily. [provider] Taking Active Self  gabapentin (NEURONTIN) 300 MG capsule [id number] Yes Take 300 mg by mouth 3 (three) times daily. [provider] Taking Active Self  levothyroxine (SYNTHROID, LEVOTHROID) 50 MCG tablet [id number] Yes Take 50 mcg by mouth daily. [provider] Taking Active Self  metoprolol tartrate (LOPRESSOR) 25 MG tablet [id number] Yes Take 1 tablet (25 mg total) by mouth 2 (two) times daily. Melrose Nakayama, MD Taking Active Self  nicotine (NICODERM CQ - DOSED IN MG/24 HOURS) 21 mg/24hr patch [id number] Yes Place 1 patch (21 mg total) onto the skin daily. Barton Dubois, MD Taking Active Self  oxyCODONE-acetaminophen (PERCOCET/ROXICET) 5-325 MG tablet [id number] Yes Take 1 tablet by mouth every 8 (eight) hours as needed for up to 7 days for severe pain. For chronic pain. He already has a prescription, but didn't get a chance to fill it because he was re admitted the next day. The prescription is for chronic pain syndrome. Hosie Poisson, MD Taking Active   OXYGEN [id number] Yes Inhale 3.5 L into the lungs daily as needed (for shortness of breath). 3-3.5lpm 24/7  [provider] Taking Active Self  pantoprazole (PROTONIX) 40 MG tablet [id number] Yes Take 1 tablet (40 mg total) by mouth at bedtime. [name], MD Taking Active  Self  traZODone (DESYREL) 100 MG tablet [id number] Yes Take 1 tablet (100 mg total) by mouth at bedtime as needed for sleep. Barton Dubois, MD Taking Active Self           Med Note (Aliayah Tyer, [name] Jan 22, 2018  2:41 PM)            ASSESSMENT: Date Discharged from Hospital: 01/17/18 Date Medication Reconciliation Performed: 01/22/2018  Medications:  New at Discharge: *from 01/04/18, but not filled due to readmission on 01/05/18 . Oxycodone/APAP * . Bupropion * . Alprazolam  *  . Benzonatate * . Nicotine patch * . Dextromethorphan/guaifenesin from 12/28  Discontinued at Discharge:   Aspirin  Prednisone  Patient was recently discharged from hospital and all medications have been reviewed  Drugs sorted by system:  Neurologic/Psychologic: alprazolam, bupropion, trazodone  Cardiovascular: atorvastatin, metoprolol tartrate  Pulmonary/Allergy:albuterol nebs and MDI, Trelegy Ellipta, benzonatate, dextromethorphan/guaifenesin  Gastrointestinal: pantoprazole  Endocrine: levothyroxine  Pain: gabapentin, oxycodone/APAP  Miscellaneous: nicotine patch  Medication Review Findings:  . Per the [place] List, alprazolam may have decreased metabolism and increases sensitivity in older adults.  Risk of cognitive impairment, delirium, falls, fractures and motor vehicle accidents may occur. There is strong evidence to avoid use in the elderly. . Oxycodone/APAP and alprazolam-patient very worried that he will run out of medication before his next doctor's visit.   All patient's medications seem appropriate.  His main focus is running out of pain and anxiety medications prior to is next appointment.  He is not willing to go to walk-in clinic or ER to address his concerns today.  Plan: Outreach to [place] RN, Jacqlyn Larsen to give her an update about Mr. Kauth.   Route note to PCP, [name].  Will follow up with patient after his PCP visit next week to see if he has any  medication questions or concerns.  Joetta Manners, PharmD Clinical Pharmacist [place] [phone]

## 2018-01-23 ENCOUNTER — Ambulatory Visit: Payer: Self-pay | Admitting: *Deleted

## 2018-01-27 ENCOUNTER — Other Ambulatory Visit: Payer: Self-pay | Admitting: Internal Medicine

## 2018-01-27 DIAGNOSIS — R58 Hemorrhage, not elsewhere classified: Secondary | ICD-10-CM

## 2018-01-27 DIAGNOSIS — I7101 Dissection of thoracic aorta: Secondary | ICD-10-CM | POA: Diagnosis not present

## 2018-01-27 DIAGNOSIS — I10 Essential (primary) hypertension: Secondary | ICD-10-CM | POA: Diagnosis not present

## 2018-01-27 DIAGNOSIS — E039 Hypothyroidism, unspecified: Secondary | ICD-10-CM | POA: Diagnosis not present

## 2018-01-27 DIAGNOSIS — I214 Non-ST elevation (NSTEMI) myocardial infarction: Secondary | ICD-10-CM | POA: Diagnosis not present

## 2018-01-27 DIAGNOSIS — G894 Chronic pain syndrome: Secondary | ICD-10-CM | POA: Diagnosis not present

## 2018-01-27 DIAGNOSIS — E7800 Pure hypercholesterolemia, unspecified: Secondary | ICD-10-CM | POA: Diagnosis not present

## 2018-01-27 DIAGNOSIS — J449 Chronic obstructive pulmonary disease, unspecified: Secondary | ICD-10-CM | POA: Diagnosis not present

## 2018-01-27 DIAGNOSIS — I25810 Atherosclerosis of coronary artery bypass graft(s) without angina pectoris: Secondary | ICD-10-CM | POA: Diagnosis not present

## 2018-01-28 ENCOUNTER — Other Ambulatory Visit: Payer: Self-pay

## 2018-01-28 ENCOUNTER — Other Ambulatory Visit: Payer: Self-pay | Admitting: *Deleted

## 2018-01-28 ENCOUNTER — Encounter: Payer: Self-pay | Admitting: *Deleted

## 2018-01-28 DIAGNOSIS — I7101 Dissection of thoracic aorta: Secondary | ICD-10-CM

## 2018-01-28 DIAGNOSIS — I71012 Dissection of descending thoracic aorta: Secondary | ICD-10-CM

## 2018-01-28 NOTE — Patient Outreach (Addendum)
[name]) Care Management  [name]  01/28/2018  [name] 1950-10-30 [id number]  Reason for referral: Medication Management  Referral source: [name] Inpatient Liaison Current insurance:[employer]  PMHx includes but not limited to: HTN, CAD, HLD, descending thoracic dissection, COPD, anxiety with history of MI s/p CABG (07/2007) and cardiac cath (02/2015). Patient was recently discharged from the hospital on 01/17/18 with discharge diagnoses of NSTEMI and retroperitoneal/chest wall bleed.   Outreach:  Successful telephone call with patient's wife, [name].  HIPAA identifiers verified.   Subjective:  Wife reports that [name] received a tramadol prescription from PCP visit yesterday, and that he is no longer taking APAP/oxycodone. Pt's wife reports he is still on buspirone and alprazolam for anxiety. Pt's wife also reports that he is still having swelling in his feet and that he "cannot put on his boots". Reports PCP was made aware of swelling, and that it has improved a little since last week.  Pt's wife reports that yesterday PCP said the swelling is from "blood flowing to that region".  Pt's wife reports he is not ambulating, other than to go to the bathroom or the car, and that home health has not come out to see them since hospital discharge. Wife reports he has a CT scan on 02/11/2018 to "check bleeding", followed by visit with [name] on 02/12/2018. Wife reports that he has followed up with [name], cardiology, post-hospital discharge.  Objective: RecentLabs       Lab Results  Component Value Date   CREATININE 0.80 01/16/2018   CREATININE 0.93 01/15/2018   CREATININE 0.98 01/14/2018      RecentLabs       Lab Results  Component Value Date   HGBA1C 5.3 11/16/2015     Lipid Panel  Labs(Brief)          Component Value Date/Time   CHOL 204 (H) 03/15/2015 0558   TRIG 116 04/09/2015 0802   HDL 29 (L) 03/15/2015 0558    CHOLHDL 7.0 03/15/2015 0558   VLDL 26 03/15/2015 0558   LDLCALC 149 (H) 03/15/2015 0558         BP Readings from Last 3 Encounters:  01/18/18 121/82  01/04/18 130/81  05/27/17 (!) 165/94        Allergies  Allergen Reactions  . Nsaids Shortness Of Breath  . Bee Venom Swelling    Neck swelling, passes out  . Codeine Other (See Comments)    Causes GI Upset               Medications Reviewed Today    Reviewed by Dionne Milo, [facility] (Pharmacist) on 01/28/18 at 1151  Med List Status: <None>  Medication Order Taking? Sig Documenting Provider Last Dose Status Informant  albuterol (PROVENTIL) (2.5 MG/3ML) 0.083% nebulizer solution [id number]  Take 3 mLs (2.5 mg total) by nebulization every 6 (six) hours as needed for wheezing or shortness of breath. Barton Dubois, MD  Active Self  albuterol (VENTOLIN HFA) 108 (90 Base) MCG/ACT inhaler [id number]  Inhale 2 puffs into the lungs every 6 (six) hours as needed for wheezing or shortness of breath. [provider]  Active Self  ALPRAZolam (XANAX) 0.25 MG tablet [id number]  Take 1 tablet (0.25 mg total) by mouth 2 (two) times daily as needed for anxiety. Barton Dubois, MD  Active Self  atorvastatin (LIPITOR) 20 MG tablet [id number]  Take 20 mg by mouth daily. [provider]  Active Self  benzonatate (TESSALON) 200 MG  capsule [id number]  Take 1 capsule (200 mg total) by mouth 3 (three) times daily as needed for cough. Barton Dubois, MD  Active Self  busPIRone (BUSPAR) 7.5 MG tablet [id number]  Take 1 tablet (7.5 mg total) by mouth 3 (three) times daily. Barton Dubois, MD  Active Self           Med Note (MENDENHALL, [name] Jan 22, 2018  2:38 PM)    dextromethorphan-guaiFENesin [facility] DM) 30-600 MG 12hr tablet [id number]  Take 1 tablet by mouth 2 (two) times daily. Hosie Poisson, MD  Active   feeding supplement, ENSURE ENLIVE, (ENSURE ENLIVE) LIQD [id number]  Take 237 mLs by mouth 2 (two) times daily  between meals. Barton Dubois, MD  Active Self  Fluticasone-Umeclidin-Vilant (TRELEGY ELLIPTA) 100-62.5-25 MCG/INH AEPB [id number]  Inhale 1 puff into the lungs daily. [provider]  Active Self  gabapentin (NEURONTIN) 300 MG capsule [id number]  Take 300 mg by mouth 3 (three) times daily. [provider]  Active Self  levothyroxine (SYNTHROID, LEVOTHROID) 50 MCG tablet [id number]  Take 50 mcg by mouth daily. [provider]  Active Self  metoprolol tartrate (LOPRESSOR) 25 MG tablet [id number]  Take 1 tablet (25 mg total) by mouth 2 (two) times daily. Melrose Nakayama, MD  Active Self  nicotine (NICODERM CQ - DOSED IN MG/24 HOURS) 21 mg/24hr patch [id number]  Place 1 patch (21 mg total) onto the skin daily. Barton Dubois, MD  Active Self  OXYGEN [id number]  Inhale 3.5 L into the lungs daily as needed (for shortness of breath). 3-3.5lpm 24/7  [provider]  Active Self  pantoprazole (PROTONIX) 40 MG tablet [id number]  Take 1 tablet (40 mg total) by mouth at bedtime. [name], MD  Active Self  traMADol (ULTRAM) 50 MG tablet [id number]  Take 100 mg by mouth 2 (two) times daily as needed. [provider]  Active   traZODone (DESYREL) 100 MG tablet [id number]  Take 1 tablet (100 mg total) by mouth at bedtime as needed for sleep. Barton Dubois, MD  Active Self           Med Note (MENDENHALL, [name] Jan 22, 2018  2:41 PM)           Assessment:  Drugs sorted by system:  Neurologic/Psychologic: alprazolam, buspirone, nicotine patch, trazodone   Cardiovascular: atorvastatin, metoprolol tartrate  Pulmonary/Allergy: albuterol, benzonatate, dextromethorphan-guaifenesin, fluticasone-umeclidium-vilanterol   Gastrointestinal: pantoprazole  Endocrine: levothyroxine  Pain: gabapentin  Medication Review Findings:  Current ASCVD 10-year risk is 11.5%. Patient's last lipid panel from 06/30/17 was: total cholesterol 134, LDL 87, HDL 36. As  such, patient should be on moderate to high intensity statin therapy. Would recommend increasing atorvastatin from 20 mg daily to 40 mg daily.    Not on antiplatelet therapy or aspirin at this time due to recent retroperitoneal bleed.  Wife reports that they have not further medication questions or concerns.  Informed her that I will close Mr. Ewen case unless I hear back from him today.  They have my phone number and are aware that they can call me in the futures should mediation issues arise.  Plan: Route discipline closure letter to PCP, [name].  Joetta Manners, PharmD Clinical Pharmacist [place] [phone]

## 2018-01-28 NOTE — Patient Outreach (Signed)
[facility]) Care Management   01/28/2018  [name] [birth year] [id number]  [name] is an 68 y.o. male  Subjective: Initial home visit with pt, wife present, HIPAA verified, pt saw primary care this week and is now on tramadol for pain, pt to have CT scan this month and follow up with primary MD 02/12/18.  Pt reports his goal is "to be more independent"  Pt wants to drive again and be able to walk better, pt states he is not very happy with his current condition, has pain from bruising to bil abdominal area.  Pt states he is wearing nicoderm patch, pt states he is still interested in home health PT for strengthening, pt has never wanted to use a walker but states he is agreeable if he can get one.  Objective:   Vitals:   01/28/18 1610  BP: 126/70  Pulse: 67  Resp: 16  SpO2: 95%  Weight: 158 lb (71.7 kg)  Height: 1.778 m (5\' 10" )   ROS  Physical Exam  Constitutional: He is oriented to person, place, and time. He appears well-developed.  HENT:  Head: Normocephalic.  Neck: Normal range of motion. Neck supple.  Cardiovascular:  Irregular rhythm  Respiratory: Effort normal.  Dyspnea with exertion  GI: Soft. Bowel sounds are normal.  Musculoskeletal: Normal range of motion.        General: Edema present.     Comments: 1+ edema LE BL from knee to foot  Neurological: He is alert and oriented to person, place, and time.  Skin: Skin is warm and dry.  Psychiatric: His behavior is normal. Thought content normal.  Irritable during visit    Encounter Medications:   Outpatient Encounter Medications as of 01/28/2018  Medication Sig  . albuterol (PROVENTIL) (2.5 MG/3ML) 0.083% nebulizer solution Take 3 mLs (2.5 mg total) by nebulization every 6 (six) hours as needed for wheezing or shortness of breath.  Marland Kitchen albuterol (VENTOLIN HFA) 108 (90 Base) MCG/ACT inhaler Inhale 2 puffs into the lungs every 6 (six) hours as needed for wheezing or shortness of breath.  . ALPRAZolam  (XANAX) 0.25 MG tablet Take 1 tablet (0.25 mg total) by mouth 2 (two) times daily as needed for anxiety.  . benzonatate (TESSALON) 200 MG capsule Take 1 capsule (200 mg total) by mouth 3 (three) times daily as needed for cough.  . busPIRone (BUSPAR) 7.5 MG tablet Take 1 tablet (7.5 mg total) by mouth 3 (three) times daily.  Marland Kitchen dextromethorphan-guaiFENesin (MUCINEX DM) 30-600 MG 12hr tablet Take 1 tablet by mouth 2 (two) times daily.  . Fluticasone-Umeclidin-Vilant (TRELEGY ELLIPTA) 100-62.5-25 MCG/INH AEPB Inhale 1 puff into the lungs daily.  Marland Kitchen gabapentin (NEURONTIN) 300 MG capsule Take 300 mg by mouth 3 (three) times daily.  . metoprolol tartrate (LOPRESSOR) 25 MG tablet Take 1 tablet (25 mg total) by mouth 2 (two) times daily.  . nicotine (NICODERM CQ - DOSED IN MG/24 HOURS) 21 mg/24hr patch Place 1 patch (21 mg total) onto the skin daily.  . OXYGEN Inhale 3.5 L into the lungs daily as needed (for shortness of breath). 3-3.5lpm 24/7   . pantoprazole (PROTONIX) 40 MG tablet Take 1 tablet (40 mg total) by mouth at bedtime.  . traMADol (ULTRAM) 50 MG tablet Take 100 mg by mouth 2 (two) times daily as needed.  . traZODone (DESYREL) 100 MG tablet Take 1 tablet (100 mg total) by mouth at bedtime as needed for sleep.  Marland Kitchen atorvastatin (LIPITOR) 20 MG tablet  Take 20 mg by mouth daily.  . feeding supplement, ENSURE ENLIVE, (ENSURE ENLIVE) LIQD Take 237 mLs by mouth 2 (two) times daily between meals. (Patient not taking: Reported on 01/28/2018)  . levothyroxine (SYNTHROID, LEVOTHROID) 50 MCG tablet Take 50 mcg by mouth daily.   No facility-administered encounter medications on file as of 01/28/2018.     Functional Status:   In your present state of health, do you have any difficulty performing the following activities: 01/28/2018 01/28/2018  Hearing? N N  Vision? N N  Difficulty concentrating or making decisions? N N  Walking or climbing stairs? Y Y  Dressing or bathing? N -  Doing errands, shopping? Y -   Conservation officer, nature and eating ? Y -  Using the Toilet? N -  In the past six months, have you accidently leaked urine? N -  Do you have problems with loss of bowel control? N -  Managing your Medications? - Y  Managing your Finances? - Y  Housekeeping or managing your Housekeeping? - Y  Some recent data might be hidden    Fall/Depression Screening:    Fall Risk  01/28/2018  Falls in the past year? 0  Risk for fall due to : Impaired balance/gait;Medication side effect  Follow up Falls evaluation completed;Education provided   PHQ 2/9 Scores 01/28/2018  PHQ - 2 Score 2  PHQ- 9 Score 3    Assessment: Pt with historyHTN, CAD, HLD, descending thoracic dissection, COPD, anxiety with history of MI s/p CABG (07/2007) and cardiac cath (02/2015). Discharged from the hospital on 01/17/18 with diagnoses of NSTEMI and retroperitoneal/chest wall bleed. Pt irritated with questions asked by RN CM citing he doesn't know why RN CM can't just look in his chart and see what there is to know. RN CM observed medications and reviewed with pt and wife, pt is to get several refills today, RN CM gave prefilled med box and wife feels this will make medication management easier. RN CM called [facility] Joetta Manners for collaboration and gave update from home visit and that pt has several refills he is picking up today.  See care plan- RN CM called home health for follow up,  RN CM faxed initial home visit and barrier letter to primary MD [name], reported pt is high risk for falls and needs a walker, ask for home health orders for PT.  [facility] CM Care Plan Problem One     Most Recent Value  Care Plan Problem One  Knowledge deficit related to COPD  Role Documenting the Problem One  Care Management [place] for Problem One  Active  [facility] Long Term Goal   Pt will verbalize/ demonstrate improvement for self care with COPD within 60 days  [facility] Long Term Goal Start Date  01/22/18  Interventions for Problem  One Long Term Goal  RN CM gave [facility] calendar and reviewed resources, gave EMMI education and  COPD gold folder with information and action plan, reviewed with pt and wife. Reminded of 24 hour nurse line number  [facility] CM Short Term Goal #1   Pt will verbalize COPD action plan/ zones within  30 days  [facility] CM Short Term Goal #1 Start Date  01/22/18  Interventions for Short Term Goal #1  RN CM gave copy COPD action plan and reviewed with pt and wife    [facility] CM Care Plan Problem Two     Most Recent Value  Care Plan Problem Two  Pt high risk  for falls  Role Documenting the Problem Two  Care Management [place] for Problem Two  Active  [facility] CM Short Term Goal #1   Pt will demonstrate safety precautions and increased endurance within 30 days  [facility] CM Short Term Goal #1 Start Date  01/28/18  Interventions for Short Term Goal #2   RN CM completed TUG and pt is high risk for falls, pt does not have walker to use, RN CM called [place], left voicemail for [employer], ask for follow up as to whether they have any orders for home health, RN CM ask pt to be mindful of oxygen tubing and keep pathways clear, always ask for assistance from wife as needed      Plan: follow up on home health PT orders and walker Reinforce COPD action plan  Jacqlyn Larsen [facility], [place] Coordinator [phone]

## 2018-01-28 NOTE — Patient Outreach (Deleted)
[facility]) Care Management  [place]  01/28/2018  CYLUS [name] 09/07/1950 [id number]  Reason for referral: Medication Management  Referral source: [facility] Inpatient Liaison Current insurance:[employer]  PMHx includes but not limited to: HTN, CAD, HLD, descending thoracic dissection, COPD, anxiety with history of MI s/p CABG (07/2007) and cardiac cath (02/2015). Patient was recently discharged from the hospital on 01/17/18 with discharge diagnoses of NSTEMI and retroperitoneal/chest wall bleed.   Outreach:  Successful telephone call with patient's wife, [name].  HIPAA identifiers verified.   Subjective:  Patient reports receiving tramadol prescription from PCP visit yesterday, and that he is no longer taking APAP/oxycodone. Pt's wife reports he is still on buspirone and alprazolam for anxiety. Pt's wife also reports that he is still having swelling in his feet and that he "cannot put on his boots". Reports PCP was made aware of swelling, and that it has improved a little since last week. Pt's wife reports that yesterday PCP said the swelling is from "blood flowing to that region". Pt's wife reports he is not ambulating, other than to go to the bathroom or the car, and that no home health has come out to see them since hospital discharge. Wife reports he has a CT scan on 02/11/2018 to "check bleeding", followed by visit with [name] on 02/12/2018. Wife reports that he has followed up with [name] post-hospital discharge. All of wife's questions were answered and asked that she have patient return our call when he wakes up later today.  Objective: Lab Results  Component Value Date   CREATININE 0.80 01/16/2018   CREATININE 0.93 01/15/2018   CREATININE 0.98 01/14/2018    Lab Results  Component Value Date   HGBA1C 5.3 11/16/2015   Lipid Panel     Component Value Date/Time   CHOL 204 (H) 03/15/2015 0558   TRIG 116 04/09/2015 0802   HDL 29 (L) 03/15/2015 0558   CHOLHDL  7.0 03/15/2015 0558   VLDL 26 03/15/2015 0558   LDLCALC 149 (H) 03/15/2015 0558    BP Readings from Last 3 Encounters:  01/18/18 121/82  01/04/18 130/81  05/27/17 (!) 165/94   Allergies  Allergen Reactions  . Nsaids Shortness Of Breath  . Bee Venom Swelling    Neck swelling, passes out  . Codeine Other (See Comments)    Causes GI Upset   Medications Reviewed Today    Reviewed by Dionne Milo, [facility] (Pharmacist) on 01/22/18 at 1521  Med List Status: <None>  Medication Order Taking? Sig Documenting Provider Last Dose Status Informant  albuterol (PROVENTIL) (2.5 MG/3ML) 0.083% nebulizer solution [id number] Yes Take 3 mLs (2.5 mg total) by nebulization every 6 (six) hours as needed for wheezing or shortness of breath. Barton Dubois, MD Taking Active Self  albuterol (VENTOLIN HFA) 108 (90 Base) MCG/ACT inhaler [id number] Yes Inhale 2 puffs into the lungs every 6 (six) hours as needed for wheezing or shortness of breath. [provider] Taking Active Self  ALPRAZolam (XANAX) 0.25 MG tablet [id number] Yes Take 1 tablet (0.25 mg total) by mouth 2 (two) times daily as needed for anxiety. Barton Dubois, MD Taking Active Self  atorvastatin (LIPITOR) 20 MG tablet [id number] Yes Take 20 mg by mouth daily. [provider] Taking Active Self  benzonatate (TESSALON) 200 MG capsule [id number] Yes Take 1 capsule (200 mg total) by mouth 3 (three) times daily as needed for cough. Barton Dubois, MD Taking Active Self  busPIRone (BUSPAR) 7.5 MG tablet [id number] Yes Take  1 tablet (7.5 mg total) by mouth 3 (three) times daily. Barton Dubois, MD Taking Active Self           Med Note (MENDENHALL, [name] Jan 22, 2018  2:38 PM)    dextromethorphan-guaiFENesin [facility] DM) 30-600 MG 12hr tablet [id number] Yes Take 1 tablet by mouth 2 (two) times daily. Hosie Poisson, MD Taking Active   feeding supplement, ENSURE ENLIVE, (ENSURE ENLIVE) LIQD [id number] Yes Take 237 mLs by mouth 2  (two) times daily between meals. Barton Dubois, MD Taking Active Self  Fluticasone-Umeclidin-Vilant (TRELEGY ELLIPTA) 100-62.5-25 MCG/INH AEPB [id number] Yes Inhale 1 puff into the lungs daily. [provider] Taking Active Self  gabapentin (NEURONTIN) 300 MG capsule [id number] Yes Take 300 mg by mouth 3 (three) times daily. [provider] Taking Active Self  levothyroxine (SYNTHROID, LEVOTHROID) 50 MCG tablet [id number] Yes Take 50 mcg by mouth daily. [provider] Taking Active Self  metoprolol tartrate (LOPRESSOR) 25 MG tablet [id number] Yes Take 1 tablet (25 mg total) by mouth 2 (two) times daily. Melrose Nakayama, MD Taking Active Self  nicotine (NICODERM CQ - DOSED IN MG/24 HOURS) 21 mg/24hr patch [id number] Yes Place 1 patch (21 mg total) onto the skin daily. Barton Dubois, MD Taking Active Self  oxyCODONE-acetaminophen (PERCOCET/ROXICET) 5-325 MG tablet [id number] Yes Take 1 tablet by mouth every 8 (eight) hours as needed for up to 7 days for severe pain. For chronic pain. He already has a prescription, but didn't get a chance to fill it because he was re admitted the next day. The prescription is for chronic pain syndrome. Hosie Poisson, MD Taking Active   OXYGEN [id number] Yes Inhale 3.5 L into the lungs daily as needed (for shortness of breath). 3-3.5lpm 24/7  [provider] Taking Active Self  pantoprazole (PROTONIX) 40 MG tablet [id number] Yes Take 1 tablet (40 mg total) by mouth at bedtime. [name], MD Taking Active Self  traZODone (DESYREL) 100 MG tablet [id number] Yes Take 1 tablet (100 mg total) by mouth at bedtime as needed for sleep. Barton Dubois, MD Taking Active Self           Med Note (MENDENHALL, [name] Jan 22, 2018  2:41 PM)           Assessment:  Drugs sorted by system:  Neurologic/Psychologic: alprazolam, buspirone, nicotine patch, trazodone   Cardiovascular: atorvastatin, metoprolol tartrate  Pulmonary/Allergy:  albuterol, benzonatate, dextromethorphan-guaifenesin, fluticasone-umeclidium-vilanterol   Gastrointestinal: pantoprazole  Endocrine: levothyroxine  Pain: gabapentin  Plan: -Current ASCVD 10-year risk is 11.5%. Patient's last lipid panel from 06/30/17 was: total cholesterol 134, LDL 87, HDL 36. As such, patient should be on moderate to high intensity statin therapy. Would recommend increasing atorvastatin from 20 mg daily to 40 mg daily.  -No further medication questions or concerns at this time.

## 2018-01-30 ENCOUNTER — Other Ambulatory Visit: Payer: Self-pay | Admitting: *Deleted

## 2018-01-30 NOTE — Patient Outreach (Signed)
RN CM called Dr. Ralene Cork office, left message for nurse Elmyra Ricks asking if letter faxed was received requesting home health PT and walker for pt.  PLAN Awaiting call back from MD office  Jacqlyn Larsen [facility], [place] Coordinator [phone]

## 2018-02-04 ENCOUNTER — Other Ambulatory Visit: Payer: Self-pay | Admitting: *Deleted

## 2018-02-04 NOTE — Patient Outreach (Addendum)
Telephone call to Dr. Ralene Cork office, left voicemail for nurse Elmyra Ricks requesting return phone call and reporting pt is at risk for falls, needs a walker and would like home health orders for PT and pt requests [place]. Elmyra Ricks nurse called back and states referral was sent to [place]. RN CM spoke with Anderson Malta RN at [facility] who reports they received referral yesterday for PT and OT and in process of working on the referral and getting pt seen hopefully this week.  PLAN Follow up with pt next week to make sure home health services are in place  Jacqlyn Larsen [facility], [place] Coordinator [phone]

## 2018-02-05 DIAGNOSIS — I214 Non-ST elevation (NSTEMI) myocardial infarction: Secondary | ICD-10-CM | POA: Diagnosis not present

## 2018-02-05 DIAGNOSIS — J449 Chronic obstructive pulmonary disease, unspecified: Secondary | ICD-10-CM | POA: Diagnosis not present

## 2018-02-05 DIAGNOSIS — I7101 Dissection of thoracic aorta: Secondary | ICD-10-CM | POA: Diagnosis not present

## 2018-02-05 DIAGNOSIS — R269 Unspecified abnormalities of gait and mobility: Secondary | ICD-10-CM | POA: Diagnosis not present

## 2018-02-05 DIAGNOSIS — I10 Essential (primary) hypertension: Secondary | ICD-10-CM | POA: Diagnosis not present

## 2018-02-05 DIAGNOSIS — M48061 Spinal stenosis, lumbar region without neurogenic claudication: Secondary | ICD-10-CM | POA: Diagnosis not present

## 2018-02-05 DIAGNOSIS — G894 Chronic pain syndrome: Secondary | ICD-10-CM | POA: Diagnosis not present

## 2018-02-05 DIAGNOSIS — M792 Neuralgia and neuritis, unspecified: Secondary | ICD-10-CM | POA: Diagnosis not present

## 2018-02-05 DIAGNOSIS — F329 Major depressive disorder, single episode, unspecified: Secondary | ICD-10-CM | POA: Diagnosis not present

## 2018-02-05 DIAGNOSIS — I2510 Atherosclerotic heart disease of native coronary artery without angina pectoris: Secondary | ICD-10-CM | POA: Diagnosis not present

## 2018-02-09 DIAGNOSIS — I7101 Dissection of thoracic aorta: Secondary | ICD-10-CM | POA: Diagnosis not present

## 2018-02-09 DIAGNOSIS — I214 Non-ST elevation (NSTEMI) myocardial infarction: Secondary | ICD-10-CM | POA: Diagnosis not present

## 2018-02-09 DIAGNOSIS — J449 Chronic obstructive pulmonary disease, unspecified: Secondary | ICD-10-CM | POA: Diagnosis not present

## 2018-02-09 DIAGNOSIS — G894 Chronic pain syndrome: Secondary | ICD-10-CM | POA: Diagnosis not present

## 2018-02-09 DIAGNOSIS — I2510 Atherosclerotic heart disease of native coronary artery without angina pectoris: Secondary | ICD-10-CM | POA: Diagnosis not present

## 2018-02-09 DIAGNOSIS — M48061 Spinal stenosis, lumbar region without neurogenic claudication: Secondary | ICD-10-CM | POA: Diagnosis not present

## 2018-02-09 DIAGNOSIS — I10 Essential (primary) hypertension: Secondary | ICD-10-CM | POA: Diagnosis not present

## 2018-02-09 DIAGNOSIS — F329 Major depressive disorder, single episode, unspecified: Secondary | ICD-10-CM | POA: Diagnosis not present

## 2018-02-09 DIAGNOSIS — M792 Neuralgia and neuritis, unspecified: Secondary | ICD-10-CM | POA: Diagnosis not present

## 2018-02-10 ENCOUNTER — Other Ambulatory Visit: Payer: Self-pay | Admitting: *Deleted

## 2018-02-10 DIAGNOSIS — M48061 Spinal stenosis, lumbar region without neurogenic claudication: Secondary | ICD-10-CM | POA: Diagnosis not present

## 2018-02-10 DIAGNOSIS — I2510 Atherosclerotic heart disease of native coronary artery without angina pectoris: Secondary | ICD-10-CM | POA: Diagnosis not present

## 2018-02-10 DIAGNOSIS — I10 Essential (primary) hypertension: Secondary | ICD-10-CM | POA: Diagnosis not present

## 2018-02-10 DIAGNOSIS — I214 Non-ST elevation (NSTEMI) myocardial infarction: Secondary | ICD-10-CM | POA: Diagnosis not present

## 2018-02-10 DIAGNOSIS — J449 Chronic obstructive pulmonary disease, unspecified: Secondary | ICD-10-CM | POA: Diagnosis not present

## 2018-02-10 DIAGNOSIS — M792 Neuralgia and neuritis, unspecified: Secondary | ICD-10-CM | POA: Diagnosis not present

## 2018-02-10 DIAGNOSIS — G894 Chronic pain syndrome: Secondary | ICD-10-CM | POA: Diagnosis not present

## 2018-02-10 DIAGNOSIS — F329 Major depressive disorder, single episode, unspecified: Secondary | ICD-10-CM | POA: Diagnosis not present

## 2018-02-10 DIAGNOSIS — I7101 Dissection of thoracic aorta: Secondary | ICD-10-CM | POA: Diagnosis not present

## 2018-02-10 NOTE — Patient Outreach (Signed)
Telephone outreach to pt to follow up on home health, spoke with pt, HIPAA verified, pt reports he does have home health with [facility] and PT is at his home now,  [facility] spoke with PTA Cedric Fishman and she is aware pt needs a walker, pt has PT, OT, RN, Raquel Sarna reports pt had elevated BP as checked by PT at first visit of 180/110, PT notified MD and metoprolol increased and Raquel Sarna is calling primary MD today to verify metoprolol dosage and pt is to see primary MD this week on 02/12/18, BP today 148/90, RN CM ask Raquel Sarna to have all disciplines record BP in [facility] calendar as all disciplines will check BP with every visit.  PLAN Follow up by telephone with pt next week Collaborate with home health as needed Assess BP readings  Jacqlyn Larsen [facility], [place] Coordinator [phone]

## 2018-02-11 ENCOUNTER — Other Ambulatory Visit: Payer: Self-pay

## 2018-02-11 ENCOUNTER — Ambulatory Visit: Payer: Self-pay | Admitting: Cardiology

## 2018-02-11 DIAGNOSIS — J449 Chronic obstructive pulmonary disease, unspecified: Secondary | ICD-10-CM | POA: Diagnosis not present

## 2018-02-11 DIAGNOSIS — R269 Unspecified abnormalities of gait and mobility: Secondary | ICD-10-CM | POA: Diagnosis not present

## 2018-02-12 DIAGNOSIS — M792 Neuralgia and neuritis, unspecified: Secondary | ICD-10-CM | POA: Diagnosis not present

## 2018-02-12 DIAGNOSIS — I214 Non-ST elevation (NSTEMI) myocardial infarction: Secondary | ICD-10-CM | POA: Diagnosis not present

## 2018-02-12 DIAGNOSIS — G894 Chronic pain syndrome: Secondary | ICD-10-CM | POA: Diagnosis not present

## 2018-02-12 DIAGNOSIS — I10 Essential (primary) hypertension: Secondary | ICD-10-CM | POA: Diagnosis not present

## 2018-02-12 DIAGNOSIS — I7101 Dissection of thoracic aorta: Secondary | ICD-10-CM | POA: Diagnosis not present

## 2018-02-12 DIAGNOSIS — M48061 Spinal stenosis, lumbar region without neurogenic claudication: Secondary | ICD-10-CM | POA: Diagnosis not present

## 2018-02-12 DIAGNOSIS — J449 Chronic obstructive pulmonary disease, unspecified: Secondary | ICD-10-CM | POA: Diagnosis not present

## 2018-02-12 DIAGNOSIS — I2510 Atherosclerotic heart disease of native coronary artery without angina pectoris: Secondary | ICD-10-CM | POA: Diagnosis not present

## 2018-02-12 DIAGNOSIS — F329 Major depressive disorder, single episode, unspecified: Secondary | ICD-10-CM | POA: Diagnosis not present

## 2018-02-16 ENCOUNTER — Other Ambulatory Visit: Payer: Self-pay | Admitting: *Deleted

## 2018-02-16 DIAGNOSIS — I214 Non-ST elevation (NSTEMI) myocardial infarction: Secondary | ICD-10-CM | POA: Diagnosis not present

## 2018-02-16 DIAGNOSIS — J449 Chronic obstructive pulmonary disease, unspecified: Secondary | ICD-10-CM | POA: Diagnosis not present

## 2018-02-16 DIAGNOSIS — G894 Chronic pain syndrome: Secondary | ICD-10-CM | POA: Diagnosis not present

## 2018-02-16 DIAGNOSIS — I2510 Atherosclerotic heart disease of native coronary artery without angina pectoris: Secondary | ICD-10-CM | POA: Diagnosis not present

## 2018-02-16 DIAGNOSIS — F329 Major depressive disorder, single episode, unspecified: Secondary | ICD-10-CM | POA: Diagnosis not present

## 2018-02-16 DIAGNOSIS — M48061 Spinal stenosis, lumbar region without neurogenic claudication: Secondary | ICD-10-CM | POA: Diagnosis not present

## 2018-02-16 DIAGNOSIS — I7101 Dissection of thoracic aorta: Secondary | ICD-10-CM | POA: Diagnosis not present

## 2018-02-16 DIAGNOSIS — I10 Essential (primary) hypertension: Secondary | ICD-10-CM | POA: Diagnosis not present

## 2018-02-16 DIAGNOSIS — M792 Neuralgia and neuritis, unspecified: Secondary | ICD-10-CM | POA: Diagnosis not present

## 2018-02-16 NOTE — Patient Outreach (Addendum)
Outreach telephone call to patient's wife Antrone Walla for follow up on blood pressure, spoke with Mrs. Touhey, HIPAA verified, Mrs. Rodwell reports " blood pressure is much better over the past few days" and reports BP today checked by OT from home health is 130/84 and all readings have been better. Mrs. Gallery states all readings are being recorded in [facility] calendar and being checked several times weekly.  PLAN Call pt next month for telephone assessment  Jacqlyn Larsen [facility], [place] Coordinator [phone]

## 2018-02-17 DIAGNOSIS — I2510 Atherosclerotic heart disease of native coronary artery without angina pectoris: Secondary | ICD-10-CM | POA: Diagnosis not present

## 2018-02-17 DIAGNOSIS — I7101 Dissection of thoracic aorta: Secondary | ICD-10-CM | POA: Diagnosis not present

## 2018-02-17 DIAGNOSIS — I10 Essential (primary) hypertension: Secondary | ICD-10-CM | POA: Diagnosis not present

## 2018-02-17 DIAGNOSIS — M792 Neuralgia and neuritis, unspecified: Secondary | ICD-10-CM | POA: Diagnosis not present

## 2018-02-17 DIAGNOSIS — I214 Non-ST elevation (NSTEMI) myocardial infarction: Secondary | ICD-10-CM | POA: Diagnosis not present

## 2018-02-17 DIAGNOSIS — F329 Major depressive disorder, single episode, unspecified: Secondary | ICD-10-CM | POA: Diagnosis not present

## 2018-02-17 DIAGNOSIS — J449 Chronic obstructive pulmonary disease, unspecified: Secondary | ICD-10-CM | POA: Diagnosis not present

## 2018-02-17 DIAGNOSIS — M48061 Spinal stenosis, lumbar region without neurogenic claudication: Secondary | ICD-10-CM | POA: Diagnosis not present

## 2018-02-17 DIAGNOSIS — G894 Chronic pain syndrome: Secondary | ICD-10-CM | POA: Diagnosis not present

## 2018-02-18 ENCOUNTER — Inpatient Hospital Stay: Admission: RE | Admit: 2018-02-18 | Payer: Self-pay | Source: Ambulatory Visit

## 2018-02-18 DIAGNOSIS — I2510 Atherosclerotic heart disease of native coronary artery without angina pectoris: Secondary | ICD-10-CM | POA: Diagnosis not present

## 2018-02-18 DIAGNOSIS — I7101 Dissection of thoracic aorta: Secondary | ICD-10-CM | POA: Diagnosis not present

## 2018-02-18 DIAGNOSIS — M792 Neuralgia and neuritis, unspecified: Secondary | ICD-10-CM | POA: Diagnosis not present

## 2018-02-18 DIAGNOSIS — G894 Chronic pain syndrome: Secondary | ICD-10-CM | POA: Diagnosis not present

## 2018-02-18 DIAGNOSIS — I10 Essential (primary) hypertension: Secondary | ICD-10-CM | POA: Diagnosis not present

## 2018-02-18 DIAGNOSIS — M48061 Spinal stenosis, lumbar region without neurogenic claudication: Secondary | ICD-10-CM | POA: Diagnosis not present

## 2018-02-18 DIAGNOSIS — I214 Non-ST elevation (NSTEMI) myocardial infarction: Secondary | ICD-10-CM | POA: Diagnosis not present

## 2018-02-18 DIAGNOSIS — J449 Chronic obstructive pulmonary disease, unspecified: Secondary | ICD-10-CM | POA: Diagnosis not present

## 2018-02-18 DIAGNOSIS — F329 Major depressive disorder, single episode, unspecified: Secondary | ICD-10-CM | POA: Diagnosis not present

## 2018-02-19 DIAGNOSIS — J449 Chronic obstructive pulmonary disease, unspecified: Secondary | ICD-10-CM | POA: Diagnosis not present

## 2018-02-19 DIAGNOSIS — G894 Chronic pain syndrome: Secondary | ICD-10-CM | POA: Diagnosis not present

## 2018-02-19 DIAGNOSIS — I10 Essential (primary) hypertension: Secondary | ICD-10-CM | POA: Diagnosis not present

## 2018-02-19 DIAGNOSIS — M792 Neuralgia and neuritis, unspecified: Secondary | ICD-10-CM | POA: Diagnosis not present

## 2018-02-19 DIAGNOSIS — M48061 Spinal stenosis, lumbar region without neurogenic claudication: Secondary | ICD-10-CM | POA: Diagnosis not present

## 2018-02-19 DIAGNOSIS — F329 Major depressive disorder, single episode, unspecified: Secondary | ICD-10-CM | POA: Diagnosis not present

## 2018-02-19 DIAGNOSIS — I7101 Dissection of thoracic aorta: Secondary | ICD-10-CM | POA: Diagnosis not present

## 2018-02-19 DIAGNOSIS — I214 Non-ST elevation (NSTEMI) myocardial infarction: Secondary | ICD-10-CM | POA: Diagnosis not present

## 2018-02-19 DIAGNOSIS — I2510 Atherosclerotic heart disease of native coronary artery without angina pectoris: Secondary | ICD-10-CM | POA: Diagnosis not present

## 2018-02-20 ENCOUNTER — Ambulatory Visit
Admission: RE | Admit: 2018-02-20 | Discharge: 2018-02-20 | Disposition: A | Discharge status: Home / Self Care | Payer: Medicare PPO | Source: Ambulatory Visit | Attending: Internal Medicine | Admitting: Internal Medicine

## 2018-02-20 ENCOUNTER — Other Ambulatory Visit: Payer: Self-pay | Admitting: Internal Medicine

## 2018-02-20 ENCOUNTER — Ambulatory Visit
Admission: RE | Admit: 2018-02-20 | Discharge: 2018-02-20 | Disposition: A | Discharge status: Home / Self Care | Payer: Medicare PPO | Source: Ambulatory Visit | Attending: Thoracic Surgery (Cardiothoracic Vascular Surgery) | Admitting: Thoracic Surgery (Cardiothoracic Vascular Surgery)

## 2018-02-20 ENCOUNTER — Other Ambulatory Visit: Payer: Self-pay | Admitting: Thoracic Surgery (Cardiothoracic Vascular Surgery)

## 2018-02-20 DIAGNOSIS — I71019 Dissection of thoracic aorta, unspecified: Secondary | ICD-10-CM

## 2018-02-20 DIAGNOSIS — R58 Hemorrhage, not elsewhere classified: Secondary | ICD-10-CM

## 2018-02-20 DIAGNOSIS — I7101 Dissection of thoracic aorta: Secondary | ICD-10-CM

## 2018-02-20 DIAGNOSIS — I71012 Dissection of descending thoracic aorta: Secondary | ICD-10-CM

## 2018-02-20 MED ORDER — IOPAMIDOL (ISOVUE-370) INJECTION 76%
75.0000 mL | Freq: Once | INTRAVENOUS | Status: AC | PRN
  Administered 2018-02-20: 75 mL via INTRAVENOUS

## 2018-02-23 ENCOUNTER — Other Ambulatory Visit: Payer: Self-pay | Admitting: *Deleted

## 2018-02-23 DIAGNOSIS — G894 Chronic pain syndrome: Secondary | ICD-10-CM | POA: Diagnosis not present

## 2018-02-23 DIAGNOSIS — I7101 Dissection of thoracic aorta: Secondary | ICD-10-CM | POA: Diagnosis not present

## 2018-02-23 DIAGNOSIS — F329 Major depressive disorder, single episode, unspecified: Secondary | ICD-10-CM | POA: Diagnosis not present

## 2018-02-23 DIAGNOSIS — I214 Non-ST elevation (NSTEMI) myocardial infarction: Secondary | ICD-10-CM | POA: Diagnosis not present

## 2018-02-23 DIAGNOSIS — M48061 Spinal stenosis, lumbar region without neurogenic claudication: Secondary | ICD-10-CM | POA: Diagnosis not present

## 2018-02-23 DIAGNOSIS — J449 Chronic obstructive pulmonary disease, unspecified: Secondary | ICD-10-CM | POA: Diagnosis not present

## 2018-02-23 DIAGNOSIS — I2510 Atherosclerotic heart disease of native coronary artery without angina pectoris: Secondary | ICD-10-CM | POA: Diagnosis not present

## 2018-02-23 DIAGNOSIS — M792 Neuralgia and neuritis, unspecified: Secondary | ICD-10-CM | POA: Diagnosis not present

## 2018-02-23 DIAGNOSIS — I10 Essential (primary) hypertension: Secondary | ICD-10-CM | POA: Diagnosis not present

## 2018-02-23 NOTE — Patient Outreach (Signed)
Outreach call to pt for telephone assessment, no answer to [phone] or [phone] and no option to leave voicemail on either phone.  RN CM mailed unsuccessful outreach letter to pt home.  PLAN Outreach pt in 3-4 business days  Jacqlyn Larsen [facility], [place] [phone]

## 2018-02-24 ENCOUNTER — Other Ambulatory Visit: Payer: Self-pay

## 2018-02-24 ENCOUNTER — Ambulatory Visit: Payer: Medicare HMO | Admitting: Thoracic Surgery (Cardiothoracic Vascular Surgery)

## 2018-02-24 ENCOUNTER — Encounter: Payer: Self-pay | Admitting: Thoracic Surgery (Cardiothoracic Vascular Surgery)

## 2018-02-24 VITALS — BP 140/98 | HR 74 | Resp 18 | Ht 68.0 in | Wt 160.0 lb

## 2018-02-24 DIAGNOSIS — I71019 Dissection of thoracic aorta, unspecified: Secondary | ICD-10-CM

## 2018-02-24 DIAGNOSIS — I714 Abdominal aortic aneurysm, without rupture: Secondary | ICD-10-CM | POA: Diagnosis not present

## 2018-02-24 DIAGNOSIS — I10 Essential (primary) hypertension: Secondary | ICD-10-CM | POA: Diagnosis not present

## 2018-02-24 DIAGNOSIS — I7101 Dissection of thoracic aorta: Secondary | ICD-10-CM

## 2018-02-24 NOTE — Progress Notes (Signed)
[address]       [place] [address]             [phone]     HPI: Mr. Lafoy returns for a scheduled follow-up visit  Rino Hosea is a 68 year old gentleman with a history of coronary disease, coronary bypass graft in 2009, a type III aortic dissection, ethanol abuse, tobacco abuse, COPD, chronic back pain, anxiety, and depression.  He was found to have a type III aortic dissection in 2008.  He was followed and till 2017.  He was lost to follow-up but then I saw him back again in May 2019.  His descending thoracic aorta had increased in size from 4.5 to 4.8 cm.  He was scheduled to see Dr. Trula Slade but did not follow-up on that appointment.  He was hospitalized in December with  chest pain and had mildly elevated troponins.  He had been planned for catheterization but that was canceled after he developed a retroperitoneal bleed on IV heparin.  He recently had a repeat CT of the chest, abdomen and pelvis  Past Medical History:  Diagnosis Date  . Anemia   . Anxiety   . Aortic dissection (HCC)    TYPE 3  . CAD (coronary artery disease)    multiple, most recently Jan-March 2017  . Cardiac tamponade   . Chronic bronchitis ([place])   . Chronic fatigue   . Chronic pain   . Constipation   . COPD (chronic obstructive pulmonary disease) ([place])   . Depression   . Dysphagia, oropharyngeal phase   . ETOH abuse   . GERD (gastroesophageal reflux disease)   . Hiatal hernia   . HTN (hypertension)   . Hypercholesterolemia   . Metabolic encephalopathy   . Opioid abuse ([place])   . Peptic ulcer disease 08/2010   EGD   . PNA (pneumonia)   . Schatzki's ring     Current Outpatient Medications  Medication Sig Dispense Refill  . albuterol (PROVENTIL) (2.5 MG/3ML) 0.083% nebulizer solution Take 3 mLs (2.5 mg total) by nebulization every 6 (six) hours as needed for wheezing or shortness of breath.    Marland Kitchen albuterol (VENTOLIN HFA) 108 (90 Base) MCG/ACT inhaler Inhale 2 puffs into the  lungs every 6 (six) hours as needed for wheezing or shortness of breath.    . ALPRAZolam (XANAX) 0.25 MG tablet Take 1 tablet (0.25 mg total) by mouth 2 (two) times daily as needed for anxiety. 20 tablet 0  . atorvastatin (LIPITOR) 20 MG tablet Take 20 mg by mouth daily.    . benzonatate (TESSALON) 200 MG capsule Take 1 capsule (200 mg total) by mouth 3 (three) times daily as needed for cough. 30 capsule 0  . busPIRone (BUSPAR) 7.5 MG tablet Take 1 tablet (7.5 mg total) by mouth 3 (three) times daily. 45 tablet 0  . dextromethorphan-guaiFENesin (MUCINEX DM) 30-600 MG 12hr tablet Take 1 tablet by mouth 2 (two) times daily. 30 tablet 0  . feeding supplement, ENSURE ENLIVE, (ENSURE ENLIVE) LIQD Take 237 mLs by mouth 2 (two) times daily between meals.    . Fluticasone-Umeclidin-Vilant (TRELEGY ELLIPTA) 100-62.5-25 MCG/INH AEPB Inhale 1 puff into the lungs daily.    Marland Kitchen gabapentin (NEURONTIN) 300 MG capsule Take 300 mg by mouth 3 (three) times daily.    Marland Kitchen levothyroxine (SYNTHROID, LEVOTHROID) 50 MCG tablet Take 50 mcg by mouth daily.    . metoprolol tartrate (LOPRESSOR) 25 MG tablet Take 1 tablet (25 mg total)  by mouth 2 (two) times daily. 60 tablet 1  . nicotine (NICODERM CQ - DOSED IN MG/24 HOURS) 21 mg/24hr patch Place 1 patch (21 mg total) onto the skin daily. 30 patch 1  . OXYGEN Inhale 3.5 L into the lungs daily as needed (for shortness of breath). 3-3.5lpm 24/7     . pantoprazole (PROTONIX) 40 MG tablet Take 1 tablet (40 mg total) by mouth at bedtime. 30 tablet 0  . traMADol (ULTRAM) 50 MG tablet Take 100 mg by mouth 2 (two) times daily as needed.    . traZODone (DESYREL) 100 MG tablet Take 1 tablet (100 mg total) by mouth at bedtime as needed for sleep. 30 tablet 0   No current facility-administered medications for this visit.     Physical Exam BP (!) 140/98 (BP Location: Left Arm, Patient Position: Sitting, Cuff Size: Normal)   Pulse 74   Resp 18   Ht 5\' 8"  (1.727 m)   Wt 160 lb (72.6 kg)    SpO2 95% Comment: RA  BMI 24.33 kg/m  Mr. Oyster is a 68 year old gentleman in no acute distress Alert and oriented x3 with no focal deficits No carotid bruits Cardiac regular rate and rhythm, no murmur Lungs clear with equal breath sounds bilaterally  Diagnostic Tests: CLINICAL DATA:  F/u TAA, dissection Prev in pacs Hx of CABG back fusion hx of COPD Hx of bilateral hip replacement Retroperitoneal bleed Cancer-none  EXAM: CT ANGIOGRAPHY CHEST  CT ABDOMEN AND PELVIS WITH CONTRAST  TECHNIQUE: Multidetector CT imaging of the chest was performed using the standard protocol during bolus administration of intravenous contrast. Multiplanar CT image reconstructions and MIPs were obtained to evaluate the vascular anatomy. Multidetector CT imaging of the abdomen and pelvis was performed using the standard protocol during bolus administration of intravenous contrast.  CONTRAST:  68mL ISOVUE-370 IOPAMIDOL (ISOVUE-370) INJECTION 76%  COMPARISON:  01/14/2018 and previous  FINDINGS: CTA CHEST FINDINGS  Cardiovascular: Heart size normal. No pericardial effusion. The RV is nondilated. Satisfactory opacification of pulmonary arteries noted, and there is no evidence of pulmonary emboli. Moderate coronary calcifications. Changes of CABG. Ascending thoracic aorta normal in caliber. Adequate contrast opacification of the thoracic aorta with no evidence of stenosis. There is classic 3-vessel brachiocephalic arch anatomy. Eccentric partially calcified plaque at the origin of the left common carotid artery resulting in short segment stenosis of doubtful hemodynamic significance.  Stable aortic dissection flap beginning in the distal arch just beyond the origin of the left subclavian artery, extending through the length of the descending thoracic segment into the abdominal aorta. Persistent focal perfusion to a small segment of the false lumen in the distal arch presumably related to  defect in the dissection flap. False lumen appears otherwise thrombosed, without significant compromise of the true lumen. Fusiform dilatation of the distal arch and descending thoracic aorta up to 4.5 cm, previously 4.4 cm by my measurement.  Mediastinum/Nodes: No hilar or mediastinal adenopathy. No mediastinal hematoma.  Lungs/Pleura: No pleural effusion. Pulmonary emphysematous changes. No nodule or infiltrate.  Musculoskeletal: No chest wall abnormality. No acute or significant osseous findings.  Review of the MIP images confirms the above findings.  CT ABDOMEN and PELVIS FINDINGS  Hepatobiliary: Punctate calcified granuloma in segment 2. No other liver lesion. Gallbladder unremarkable. No biliary ductal dilatation.  Pancreas: Unremarkable. No pancreatic ductal dilatation or surrounding inflammatory changes.  Spleen: Normal in size without focal abnormality.  Adrenals/Urinary Tract: 1 mm calcification in the mid right renal collecting system. No nephrolithiasis. No  renal mass. Normal adrenals. Urinary bladder incompletely distended.  Stomach/Bowel: Stomach and small bowel are decompressed. Normal appendix. The colon is nondistended, unremarkable.  Vascular/Lymphatic: Aortic dissection extends to the juxtarenal segment.  The dissection flap involves the celiac axis as before, with mild attenuation of the true lumen, branch vessels remain patent.  Fusiform infrarenal abdominal aortic aneurysm measuring up to 4.9 maximum transverse diameter (previously 4.7), tapering to 3.4 cm at the bifurcation. There is eccentric nonocclusive mural thrombus in the aneurysmal segment. Left common iliac artery dilated up to 1.8 cm diameter, right 1.4 cm.  No abdominal or pelvic adenopathy. No venous pathology identified. Portal vein patent.  Reproductive: Prostate is unremarkable.  Other: No ascites. No free air.  Musculoskeletal: Changes of PLIF L4-S1. Adjacent  level degenerative disc disease L3-4. No fracture or worrisome bone lesions.  Review of the MIP images confirms the above findings.  IMPRESSION: 1. Stable type B aortic dissection with slight enlargement of 4.5 cm distal arch and descending thoracic aortic aneurysm, previously 4.4 cm. Recommend semi-annual imaging followup by CTA or MRA and referral to cardiothoracic surgery if not already obtained. This recommendation follows 2010 ACCF/AHA/AATS/ACR/ASA/SCA/SCAI/SIR/STS/SVM Guidelines for the Diagnosis and Management of Patients With Thoracic Aortic Disease. Circulation. 2010; 121: e266-e36 2. Interval increase in size of 4.9 cm infrarenal abdominal aortic aneurysm, previously 4.7 cm. Recommend followup by abdomen and pelvis CTA in 6 months, and vascular surgery referral/consultation if not already obtained. This recommendation follows ACR consensus guidelines: White Paper of the ACR Incidental Findings Committee II on Vascular Findings. J Am Coll Radiol 2013; 10:789-794. 3. Right nephrolithiasis without hydronephrosis.   Electronically Signed   By: Lucrezia Europe M.D.   On: 02/20/2018 14:29 I personally reviewed the CT images and concur with the findings noted above  Impression: Mr. Levenhagen is a 68 year old gentleman with multiple medical problems including poorly controlled hypertension, hyperlipidemia, coronary artery disease, coronary bypass grafting 11 years ago, type III aortic dissection, and an abdominal aortic aneurysm.  He recently was hospitalized with chest pain.  He had mild elevation of the troponins.  His catheterization was canceled after he developed a retroperitoneal bleed while on heparin.  His CT angiogram shows his descending aorta to be stable.  It really measures more like 4.5 cm than the 4.8 on his previous scan.  Of greater concern currently is is 4.9 cm infrarenal abdominal aneurysm.  He had previously been scheduled to see Dr. Trula Slade, but did not follow-up  with that appointment.  I will get him scheduled to see 1 of the vascular doctors either Dr. Trula Slade or 1 of his partners.  I emphasized the importance of following up with them.  Hypertension-blood pressure still not optimally controlled but better than it has been in previous visits.  Plan: Referral to [facility] for consult.  4.9 cm infrarenal abdominal aortic aneurysm.  Recent retroperitoneal bleed.  Return in 6 months with CT angiogram of chest abdomen and pelvis Melrose Nakayama, MD [facility] [phone]

## 2018-02-25 DIAGNOSIS — F329 Major depressive disorder, single episode, unspecified: Secondary | ICD-10-CM | POA: Diagnosis not present

## 2018-02-25 DIAGNOSIS — M48061 Spinal stenosis, lumbar region without neurogenic claudication: Secondary | ICD-10-CM | POA: Diagnosis not present

## 2018-02-25 DIAGNOSIS — I10 Essential (primary) hypertension: Secondary | ICD-10-CM | POA: Diagnosis not present

## 2018-02-25 DIAGNOSIS — M792 Neuralgia and neuritis, unspecified: Secondary | ICD-10-CM | POA: Diagnosis not present

## 2018-02-25 DIAGNOSIS — G894 Chronic pain syndrome: Secondary | ICD-10-CM | POA: Diagnosis not present

## 2018-02-25 DIAGNOSIS — I2510 Atherosclerotic heart disease of native coronary artery without angina pectoris: Secondary | ICD-10-CM | POA: Diagnosis not present

## 2018-02-25 DIAGNOSIS — J449 Chronic obstructive pulmonary disease, unspecified: Secondary | ICD-10-CM | POA: Diagnosis not present

## 2018-02-25 DIAGNOSIS — I214 Non-ST elevation (NSTEMI) myocardial infarction: Secondary | ICD-10-CM | POA: Diagnosis not present

## 2018-02-25 DIAGNOSIS — I7101 Dissection of thoracic aorta: Secondary | ICD-10-CM | POA: Diagnosis not present

## 2018-02-26 ENCOUNTER — Other Ambulatory Visit: Payer: Self-pay | Admitting: *Deleted

## 2018-02-26 ENCOUNTER — Encounter: Payer: Self-pay | Admitting: *Deleted

## 2018-02-26 NOTE — Patient Outreach (Signed)
Outreach call to pt for telephone assessment, spoke with pt, HIPAA verified, wife Terri Piedra not available to speak with, pt reports home health RN and PT continues to work with him, pt states " I feel so much better", states "breathing ok and I have all my medicine"  New medication added for BP- amlodipine 12m daily,  Pt reports he followed up with Dr. HSheryle Hailyesterday and will be following up with specialists "because I have another aortic dissection 4.9 cm and they want me to have surgery right away"  Pt states he is in process of follow ups, pt states his neurosurgeon Dr. RCarloyn Mannerwill be retiring and pt in process of obtaining new doctor and may choose Dr. SVertell Limber    THN CM Care Plan Problem One     Most Recent Value  Care Plan Problem One  Knowledge deficit related to COPD  Role Documenting the Problem One  Care Management Coordinator  Care Plan for Problem One  Active  THN Long Term Goal   Pt will verbalize/ demonstrate improvement for self care with COPD within 60 days  THN Long Term Goal Start Date  01/22/18  Interventions for Problem One Long Term Goal  RN CM reviewed plan of care with pt, pt states amlodipine 531madded for BP control.  THN CM Short Term Goal #1   Pt will verbalize COPD action plan/ zones within  30 days  THN CM Short Term Goal #1 Start Date  02/26/18 [goal restarted]  Interventions for Short Term Goal #1  RN CM reinforced COPD action plan/ zones, importance of calling MD early for change in health status    [facility] Problem Two     Most Recent Value  Care Plan Problem Two  Pt high risk for falls  Role Documenting the Problem Two  Care Management Coordinator  Care Plan for Problem Two  Active  THN CM Short Term Goal #1   Pt will demonstrate safety precautions and increased endurance within 30 days  THN CM Short Term Goal #1 Start Date  01/28/18  [facility] Met Date   02/26/18  Interventions for Short Term Goal #2   RN CM reinforced safety precautions with  pt, home health PT continues working with pt, pt reports he feels stronger      PLCharity fundraisert next month for telephone assessment  JuJacqlyn LarsenNRay County Memorial HospitalBSReftonoordinator [phone]

## 2018-03-08 DIAGNOSIS — R269 Unspecified abnormalities of gait and mobility: Secondary | ICD-10-CM | POA: Diagnosis not present

## 2018-03-09 ENCOUNTER — Ambulatory Visit: Payer: Medicare HMO | Admitting: Vascular Surgery

## 2018-03-09 ENCOUNTER — Encounter: Payer: Self-pay | Admitting: Vascular Surgery

## 2018-03-09 VITALS — BP 155/101 | HR 67 | Temp 97.1°F | Resp 20 | Wt 162.0 lb

## 2018-03-09 DIAGNOSIS — I714 Abdominal aortic aneurysm, without rupture, unspecified: Secondary | ICD-10-CM

## 2018-03-09 DIAGNOSIS — I712 Thoracic aortic aneurysm, without rupture, unspecified: Secondary | ICD-10-CM

## 2018-03-09 NOTE — Progress Notes (Signed)
Vascular and Vein Specialist of [facility] office  Patient name: [name] MRN: [id number] DOB: Jun 19, 1950 Sex: male  REASON FOR CONSULT: Discussion of infrarenal abdominal aortic aneurysm  HPI: [name] is a 68 y.o. male, who is here today for discussion of incidental finding of infrarenal abdominal aortic aneurysm.  Also has a history of chronic type B dissection with a maximal diameter of his aorta of 4.5 cm.  He had coronary artery bypass grafting in 2009.  Reports chronic type B dissection soon thereafter.  We will follow-up showing very slow progression.  Recently he had CT of his abdomen showing a 4.9 cm infrarenal abdominal aortic aneurysm.x  Past Medical History:  Diagnosis Date  . Anemia   . Anxiety   . Aortic dissection (HCC)    TYPE 3  . CAD (coronary artery disease)    multiple, most recently Jan-March 2017  . Cardiac tamponade   . Chronic bronchitis ([place])   . Chronic fatigue   . Chronic pain   . Constipation   . COPD (chronic obstructive pulmonary disease) ([place])   . Depression   . Dysphagia, oropharyngeal phase   . ETOH abuse   . GERD (gastroesophageal reflux disease)   . Hiatal hernia   . HTN (hypertension)   . Hypercholesterolemia   . Metabolic encephalopathy   . Opioid abuse ([place])   . Peptic ulcer disease 08/2010   EGD   . PNA (pneumonia)   . Schatzki's ring     Family History  Problem Relation Age of Onset  . Heart disease Father   . Emphysema Paternal Grandmother        never smoked, worked in a [employer]  . Emphysema Paternal Grandfather        never smoked, worked in a [employer]   . Stomach cancer Paternal Grandfather   . Asthma Sister     SOCIAL HISTORY: Social History   Socioeconomic History  . Marital status: Married    Spouse name: Not on file  . Number of children: Not on file  . Years of education: Not on file  . Highest education level: Not on file  Occupational History  .  Occupation: Dealer  Social Needs  . Financial resource strain: Not on file  . Food insecurity:    Worry: Not on file    Inability: Not on file  . Transportation needs:    Medical: Not on file    Non-medical: Not on file  Tobacco Use  . Smoking status: Former Smoker    Packs/day: 1.00    Years: 20.00    Pack years: 20.00    Types: Cigarettes    Last attempt to quit: 12/28/2017    Years since quitting: 0.1  . Smokeless tobacco: Former Systems developer    Quit date: 06/05/2015  . Tobacco comment: 04/30/16- smoking 1 cig per day//lmr  Substance and Sexual Activity  . Alcohol use: No    Alcohol/week: 0.0 standard drinks    Comment: denies  . Drug use: No  . Sexual activity: Not on file  Lifestyle  . Physical activity:    Days per week: Not on file    Minutes per session: Not on file  . Stress: Not on file  Relationships  . Social connections:    Talks on phone: Not on file    Gets together: Not on file    Attends religious service: Not on file    Active member of club or  organization: Not on file    Attends meetings of clubs or organizations: Not on file    Relationship status: Not on file  . Intimate partner violence:    Fear of current or ex partner: Not on file    Emotionally abused: Not on file    Physically abused: Not on file    Forced sexual activity: Not on file  Other Topics Concern  . Not on file  Social History Narrative           Allergies  Allergen Reactions  . Nsaids Shortness Of Breath  . Bee Venom Swelling    Neck swelling, passes out  . Codeine Other (See Comments)    Causes GI Upset    Current Outpatient Medications  Medication Sig Dispense Refill  . albuterol (PROVENTIL) (2.5 MG/3ML) 0.083% nebulizer solution Take 3 mLs (2.5 mg total) by nebulization every 6 (six) hours as needed for wheezing or shortness of breath.    Marland Kitchen albuterol (VENTOLIN HFA) 108 (90 Base) MCG/ACT inhaler Inhale 2 puffs into the lungs every 6 (six) hours as needed for wheezing or  shortness of breath.    . ALPRAZolam (XANAX) 0.25 MG tablet Take 1 tablet (0.25 mg total) by mouth 2 (two) times daily as needed for anxiety. 20 tablet 0  . amLODipine (NORVASC) 5 MG tablet Take 5 mg by mouth daily.    Marland Kitchen atorvastatin (LIPITOR) 20 MG tablet Take 20 mg by mouth daily.    . busPIRone (BUSPAR) 7.5 MG tablet Take 1 tablet (7.5 mg total) by mouth 3 (three) times daily. 45 tablet 0  . dextromethorphan-guaiFENesin (MUCINEX DM) 30-600 MG 12hr tablet Take 1 tablet by mouth 2 (two) times daily. 30 tablet 0  . feeding supplement, ENSURE ENLIVE, (ENSURE ENLIVE) LIQD Take 237 mLs by mouth 2 (two) times daily between meals.    . Fluticasone-Umeclidin-Vilant (TRELEGY ELLIPTA) 100-62.5-25 MCG/INH AEPB Inhale 1 puff into the lungs daily.    Marland Kitchen gabapentin (NEURONTIN) 300 MG capsule Take 300 mg by mouth 3 (three) times daily.    Marland Kitchen levothyroxine (SYNTHROID, LEVOTHROID) 50 MCG tablet Take 50 mcg by mouth daily.    . metoprolol tartrate (LOPRESSOR) 25 MG tablet Take 1 tablet (25 mg total) by mouth 2 (two) times daily. 60 tablet 1  . nicotine (NICODERM CQ - DOSED IN MG/24 HOURS) 21 mg/24hr patch Place 1 patch (21 mg total) onto the skin daily. 30 patch 1  . OXYGEN Inhale 3.5 L into the lungs daily as needed (for shortness of breath). 3-3.5lpm 24/7     . pantoprazole (PROTONIX) 40 MG tablet Take 1 tablet (40 mg total) by mouth at bedtime. 30 tablet 0  . traMADol (ULTRAM) 50 MG tablet Take 100 mg by mouth 2 (two) times daily as needed.    . traZODone (DESYREL) 100 MG tablet Take 1 tablet (100 mg total) by mouth at bedtime as needed for sleep. 30 tablet 0   No current facility-administered medications for this visit.     REVIEW OF SYSTEMS:  [X]  denotes positive finding, [ ]  denotes negative finding Cardiac  Comments:  Chest pain or chest pressure: x   Shortness of breath upon exertion: x   Short of breath when lying flat:    Irregular heart rhythm: x       Vascular    Pain in calf, thigh, or hip  brought on by ambulation: x   Pain in feet at night that wakes you up from your sleep:  Blood clot in your veins:    Leg swelling:         Pulmonary    Oxygen at home: x   Productive cough:     Wheezing:         Neurologic    Sudden weakness in arms or legs:     Sudden numbness in arms or legs:     Sudden onset of difficulty speaking or slurred speech:    Temporary loss of vision in one eye:     Problems with dizziness:         Gastrointestinal    Blood in stool:     Vomited blood:         Genitourinary    Burning when urinating:     Blood in urine:        Psychiatric    Major depression:         Hematologic    Bleeding problems:    Problems with blood clotting too easily:        Skin    Rashes or ulcers:        Constitutional    Fever or chills:      PHYSICAL EXAM: Vitals:   03/09/18 1304  BP: (!) 155/101  Pulse: 67  Resp: 20  Temp: (!) 97.1 F (36.2 C)  TempSrc: Temporal  Weight: 162 lb (73.5 kg)    GENERAL: The patient is a well-nourished male, in no acute distress. The vital signs are documented above. CARDIOVASCULAR: 2+ radial pulses bilaterally.  2+ femoral pulses bilaterally. PULMONARY: There is good air exchange  ABDOMEN: Soft and non-tender.  I do not palpate aneurysm mUSCULOSKELETAL: There are no major deformities or cyanosis. NEUROLOGIC: No focal weakness or paresthesias are detected. SKIN: There are no ulcers or rashes noted. PSYCHIATRIC: The patient has a normal affect.  DATA:  I reviewed his CT scan showing thoracic dissection with mainly thrombosed false lumen.  Maximal diameter of his thoracic aorta is 4.5 cm.  He does have an infrarenal abdominal aortic aneurysm with a maximal diameter of 4.9 cm  MEDICAL ISSUES: Long discussion regarding both of these.  I explained that he is certainly below the threshold where we would recommend elective repair.  He has been scheduled by Dr. Roxan Hockey CT of chest abdomen and pelvis in 6 months.  We  will see him regarding this as well  Rosetta Posner, MD [facility] Vascular and Vein Specialists of [facility] Tel [phone] Pager [phone]

## 2018-03-30 ENCOUNTER — Encounter: Payer: Self-pay | Admitting: *Deleted

## 2018-03-30 ENCOUNTER — Other Ambulatory Visit: Payer: Self-pay | Admitting: *Deleted

## 2018-03-30 NOTE — Patient Outreach (Signed)
Outreach call to pt for telephone assessment, spoke with pt, HIPAA verified, pt reports " I'm doing better, still have aortic aneurysm, 2 of them now but not having surgery, they're watching them"  Pt states home health discharged him and reports he has [place] "checking on me" every 2 weeks, Pt reports his neurologist retires in April and pt plans to discuss referral to another neurologist when he sees primary care on 04/02/18,  Pt reports still checks blood pressure and most readings " are good with occasional high reading thrown in there"  Pt states he has all medications and taking as prescribed, using inhalers as prescribed, RN CM will discharge from community care management and pt refuses RN health coach and states " I can only take one nurse at a time"  RN CM mailed case closure letter to pt home and faxed case closure letter to primary MD office.  THN CM Care Plan Problem One     Most Recent Value  Care Plan Problem One  Knowledge deficit related to COPD  Role Documenting the Problem One  Care Management Coordinator  Care Plan for Problem One  Active  THN Long Term Goal   Pt will verbalize/ demonstrate improvement for self care with COPD within 60 days  THN Long Term Goal Start Date  01/22/18  [facility] Long Term Goal Met Date  03/30/18  Interventions for Problem One Long Term Goal  RN CM reviewed plan of care and discharge plan,  discussed transfer to RN health coach and pt declines citing [employer] nurse "checks on me every 2 weeks and its too many nurses, confusing"  THN CM Short Term Goal #1   Pt will verbalize COPD action plan/ zones within  30 days  THN CM Short Term Goal #1 Start Date  02/26/18 [goal restarted]  [facility] CM Short Term Goal #1 Met Date  03/30/18  Interventions for Short Term Goal #1  RN CM reviewed COPD action plan/ zones, importance of calling MD early for change in symptoms, health status    THN CM Care Plan Problem Two     Most Recent Value  Care Plan Problem Two  Pt high  risk for falls  Role Documenting the Problem Two  Care Management [place] for Problem Two  Active  THN CM Short Term Goal #1   Pt will demonstrate safety precautions and increased endurance within 30 days  THN CM Short Term Goal #1 Start Date  01/28/18  [facility] CM Short Term Goal #1 Met Date   02/26/18      PLAN Close case today  Jacqlyn Larsen [facility], BSN [place] Coordinator [phone]

## 2018-04-02 DIAGNOSIS — K219 Gastro-esophageal reflux disease without esophagitis: Secondary | ICD-10-CM | POA: Diagnosis not present

## 2018-04-02 DIAGNOSIS — E7800 Pure hypercholesterolemia, unspecified: Secondary | ICD-10-CM | POA: Diagnosis not present

## 2018-04-02 DIAGNOSIS — I214 Non-ST elevation (NSTEMI) myocardial infarction: Secondary | ICD-10-CM | POA: Diagnosis not present

## 2018-04-02 DIAGNOSIS — G894 Chronic pain syndrome: Secondary | ICD-10-CM | POA: Diagnosis not present

## 2018-04-02 DIAGNOSIS — I10 Essential (primary) hypertension: Secondary | ICD-10-CM | POA: Diagnosis not present

## 2018-04-02 DIAGNOSIS — I714 Abdominal aortic aneurysm, without rupture: Secondary | ICD-10-CM | POA: Diagnosis not present

## 2018-04-02 DIAGNOSIS — J449 Chronic obstructive pulmonary disease, unspecified: Secondary | ICD-10-CM | POA: Diagnosis not present

## 2018-04-02 DIAGNOSIS — E039 Hypothyroidism, unspecified: Secondary | ICD-10-CM | POA: Diagnosis not present

## 2018-04-02 DIAGNOSIS — I7101 Dissection of thoracic aorta: Secondary | ICD-10-CM | POA: Diagnosis not present

## 2018-04-06 DIAGNOSIS — R269 Unspecified abnormalities of gait and mobility: Secondary | ICD-10-CM | POA: Diagnosis not present

## 2018-04-09 ENCOUNTER — Telehealth: Payer: Self-pay | Admitting: Cardiology

## 2018-04-09 NOTE — Telephone Encounter (Signed)
[name]-appointment 04/13/2018 call regarding coronavirus rescheduling.  He is currently doing well.  Recently saw Dr. early and Dr. Roxan Hockey.  Has infrarenal aneurysm 4.9 cm which will be monitored.  Chronic type III dissection stable at this time.  He is not having any chest discomfort shortness of breath fevers or chills.  No need for any medications at this time.  He understands that if he has any cardiac issues he is to call us.  I would be comfortable postponing his appointment for 12 weeks or greater.  Candee Furbish, MD

## 2018-04-13 ENCOUNTER — Ambulatory Visit: Payer: Medicare HMO | Admitting: Cardiology

## 2018-04-27 ENCOUNTER — Telehealth: Payer: Self-pay | Admitting: *Deleted

## 2018-04-27 NOTE — Telephone Encounter (Signed)
TELEPHONE CALL NOTE  This patient has been deemed a candidate for follow-up tele-health visit to limit community exposure during the Covid-19 pandemic. I spoke with the patient via phone to discuss instructions. This has been outlined on the patient's AVS (dotphrase: hcevisitinfo). The patient was advised to review the section on consent for treatment as well. The patient will receive a phone call 2-3 days prior to their E-Visit at which time consent will be verbally confirmed. A Virtual Office Visit appointment type has been scheduled for 04/29/28 with Cecilie Kicks, NP, with "VIDEO" or "TELEPHONE" in the appointment notes - patient prefers PHONE type.  I have either confirmed the patient is active in MyChart or offered to send sign-up link to phone/email via Mychart icon beside patient's photo.. Pt declined, as he has old "flip phone" and no computer.   Jeanann Lewandowsky, [place] 04/27/2018 2:55 PM      Virtual Visit Pre-Appointment Phone Call  Steps For Call:  1. Confirm consent - "In the setting of the current Covid19 crisis, you are scheduled for a (phone or video) visit with your provider on (date) at (time).  Just as we do with many in-office visits, in order for you to participate in this visit, we must obtain consent.  If you'd like, I can send this to your mychart (if signed up) or email for you to review.  Otherwise, I can obtain your verbal consent now.  All virtual visits are billed to your insurance company just like a normal visit would be.  By agreeing to a virtual visit, we'd like you to understand that the technology does not allow for your provider to perform an examination, and thus may limit your provider's ability to fully assess your condition.  Finally, though the technology is pretty good, we cannot assure that it will always work on either your or our end, and in the setting of a video visit, we may have to convert it to a phone-only visit.  In either situation, we cannot ensure  that we have a secure connection.  Are you willing to proceed?"  2. Give patient instructions for WebEx download to smartphone as below if video visit  3. Advise patient to be prepared with any vital sign or heart rhythm information, their current medicines, and a piece of paper and pen handy for any instructions they may receive the day of their visit  4. Inform patient they will receive a phone call 15 minutes prior to their appointment time (may be from unknown caller ID) so they should be prepared to answer  5. Confirm that appointment type is correct in Epic appointment notes (video vs telephone)    TELEPHONE CALL NOTE  [name] has been deemed a candidate for a follow-up tele-health visit to limit community exposure during the Covid-19 pandemic. I spoke with the patient via phone to ensure availability of phone/video source, confirm preferred email & phone number, and discuss instructions and expectations.  I reminded [name] to be prepared with any vital sign and/or heart rhythm information that could potentially be obtained via home monitoring, at the time of his visit. I reminded [name] to expect a phone call at the time of his visit if his visit.  Did the patient verbally acknowledge consent to treatment? YES  Jeanann Lewandowsky, RMA 04/27/2018 2:56 PM   DOWNLOADING THE [place], go to [employer] and type in WebEx in the search bar. Download  [employer], the blue/green circle. The app is free but as with any other app downloads, their phone may require them to verify saved payment information or Apple password. The patient does NOT have to create an account.  - If Android, ask patient to go to [employer] and type in WebEx in the search bar. [place] [employer], the blue/green circle. The app is free but as with any other app downloads, their phone may require them to verify saved payment information or Android password.  The patient does NOT have to create an account.   CONSENT FOR TELE-HEALTH VISIT - PLEASE REVIEW  I hereby voluntarily request, consent and authorize [facility] and its employed or contracted physicians, physician assistants, nurse practitioners or other licensed health care professionals (the Practitioner), to provide me with telemedicine health care services (the Services") as deemed necessary by the treating Practitioner. I acknowledge and consent to receive the Services by the Practitioner via telemedicine. I understand that the telemedicine visit will involve communicating with the Practitioner through live audiovisual communication technology and the disclosure of certain medical information by electronic transmission. I acknowledge that I have been given the opportunity to request an in-person assessment or other available alternative prior to the telemedicine visit and am voluntarily participating in the telemedicine visit.  I understand that I have the right to withhold or withdraw my consent to the use of telemedicine in the course of my care at any time, without affecting my right to future care or treatment, and that the Practitioner or I may terminate the telemedicine visit at any time. I understand that I have the right to inspect all information obtained and/or recorded in the course of the telemedicine visit and may receive copies of available information for a reasonable fee.  I understand that some of the potential risks of receiving the Services via telemedicine include:   Delay or interruption in medical evaluation due to technological equipment failure or disruption;  Information transmitted may not be sufficient (e.g. poor resolution of images) to allow for appropriate medical decision making by the Practitioner; and/or   In rare instances, security protocols could fail, causing a breach of personal health information.  Furthermore, I acknowledge that it is my responsibility  to provide information about my medical history, conditions and care that is complete and accurate to the best of my ability. I acknowledge that Practitioner's advice, recommendations, and/or decision may be based on factors not within their control, such as incomplete or inaccurate data provided by me or distortions of diagnostic images or specimens that may result from electronic transmissions. I understand that the practice of medicine is not an exact science and that Practitioner makes no warranties or guarantees regarding treatment outcomes. I acknowledge that I will receive a copy of this consent concurrently upon execution via email to the email address I last provided but may also request a printed copy by calling the office of [facility].    I understand that my insurance will be billed for this visit.   I have read or had this consent read to me.  I understand the contents of this consent, which adequately explains the benefits and risks of the Services being provided via telemedicine.   I have been provided ample opportunity to ask questions regarding this consent and the Services and have had my questions answered to my satisfaction.  I give my informed consent for the services to be provided through the use of telemedicine in  my medical care  By participating in this telemedicine visit I agree to the above.

## 2018-04-27 NOTE — Telephone Encounter (Signed)
Pt scheduled for virtual / phone visit, 04/30/2018 with Cecilie Kicks, NP

## 2018-04-29 NOTE — Progress Notes (Signed)
Virtual Visit via Telephone Note   This visit type was conducted due to national recommendations for restrictions regarding the COVID-19 Pandemic (e.g. social distancing) in an effort to limit this patient's exposure and mitigate transmission in our community.  Due to his co-morbid illnesses, this patient is at least at moderate risk for complications without adequate follow up.  This format is felt to be most appropriate for this patient at this time.  The patient did not have access to video technology/had technical difficulties with video requiring transitioning to audio format only (telephone).  All issues noted in this document were discussed and addressed.  No physical exam could be performed with this format.  Please refer to the patient's chart for his  consent to telehealth for [facility].   Evaluation Performed:  Follow-up visit  Date:  04/30/2018   ID:  [name], DOB 1950/01/29, MRN [id number]  Patient Location: Home  Provider Location: Office  PCP:  Wenda Low, MD  Cardiologist:  Candee Furbish, MD  Electrophysiologist:  None   Chief Complaint:  CAD last seen 07/20/2015 in office now post hospital visit.  History of Present Illness:    [name] is a 68 y.o. male who presents via audio conferencing for a telehealth visit today.    He has a past medical history of CAD (s/p CABG in 2009 with cath in 02/2008 showing occluded SVG-PDA and SVG-OM, repeat cath in 02/2015 showing patency of LIMA-LAD, SVG-D1, and free RIMA-Ramus and PCI/DES of native RCA), Type B aortic dissection, chronic hypoxic respiratory failure (on 3-4L [place] at baseline), HLD, and orthostatic hypotension.  He was admitted 01/05/18 to [facility].  He has known chronic chest pain and on fentanyl patch by pain management.  Also with chronic hypoxic respiratory failure and COPD.   He had been admitted with acute COPD exacerbation and improved then returned on 12/16 with pain all over.  Troponin pk 0.19.  CTA with  previously noted type B dissectionof the aorta beginning just beyond the origin of the left subclavian artery and extending into the upper abdomen and into the proximal left renal artery, causing approximately 90+% stenosis of the proximal left renal artery, interval thrombosis of the false lumen of the dissection.    With the pain he was placed on IV heparin and with the elevated troponin.  also BB therapy.  His echo on that admit with preserved EF 55-60% with no RWMA.    He was transferred to [facility] for cath, but developed drop in Hgb from 12 to 8.  He had stat CT angio of abd and found 2 areas of retroperitoneal bleeding.  Large area of RP bleeding in Rt and another large area of bleed in rectus sheath, his chronic dissection was stable.  Heparin stopped and cath cancelled.   Transfused 2 units PRBCs.  Off asa  Prior to discharge he was chest pain free.  ASA was to be resumed in 1 week.  Pt was to be treated medically.  D/c'd 01/17/18  On CT he was also found to have infrarenal AAA 4.9 CM now followed by Dr. Donnetta Hutching for repeat CT in 6 months.  Today pt is doing ok,  He does have a cough that is 27 weeks old, he believes due to allergies.  He is on Flonase nasal spray and benadryl for this.  He is out of one of his inhalers that he receives in samples from PCP and will call them again to see if he  can be given samples.   He still has chest pain and Lt arm pain at times.  He tries not to take the NTG due to headache.  His BP is very elevated as well.  We discussed controlling BP with his aneurysms and dissections. And will discuss cardiac cath with [name].   Pt tells me his ecchymosis from retroperitoneal bleed is now yellow in color.  His breathing is stable on on home oxygen most of the time.  He has no fevers. No New SOB.      The patient does not have symptoms concerning for COVID-19 infection (fever, chills, cough, or new shortness of breath).    Past Medical History:  Diagnosis Date  . Anemia    . Anxiety   . Aortic dissection (HCC)    TYPE 3  . CAD (coronary artery disease)    multiple, most recently Jan-March 2017  . Cardiac tamponade   . Chronic bronchitis ([place])   . Chronic fatigue   . Chronic pain   . Constipation   . COPD (chronic obstructive pulmonary disease) ([place])   . Depression   . Dysphagia, oropharyngeal phase   . ETOH abuse   . GERD (gastroesophageal reflux disease)   . Hiatal hernia   . HTN (hypertension)   . Hypercholesterolemia   . Metabolic encephalopathy   . Opioid abuse ([place])   . Peptic ulcer disease 08/2010   EGD   . PNA (pneumonia)   . Schatzki's ring    Past Surgical History:  Procedure Laterality Date  . BACK SURGERY    . CABG X 6  07/31/2007   [facility]  . CARDIAC CATHETERIZATION    . CARDIAC CATHETERIZATION N/A 03/16/2015   Procedure: Coronary/Graft Angiography;  Surgeon: Sherren Mocha, MD;  Location: [place] [facility];  Service: Cardiovascular;  Laterality: N/A;  . CARDIAC CATHETERIZATION N/A 03/16/2015   Procedure: Coronary Stent Intervention;  Surgeon: Sherren Mocha, MD;  Location: [place] [facility];  Service: Cardiovascular;  Laterality: N/A;  . KNEE SURGERY Right    acl  . OLECRANON BURSECTOMY Left 08/27/2012   Procedure: EXCISION LEFT OLECRANON BURSA;  Surgeon: Sanjuana Kava, MD;  Location: [facility];  Service: Orthopedics;  Laterality: Left;  . OLECRANON BURSECTOMY Left 05/25/2013   Procedure:  Excision sinus tract and tissue left elbow ;  Surgeon: Sanjuana Kava, MD;  Location: [facility];  Service: Orthopedics;  Laterality: Left;  . ORIF SCAPULAR FRACTURE Right   . SHOULDER SURGERY Left   . STERNAL WIRE REMOVEAL  12/31/2007   [facility]     Current Meds  Medication Sig  . albuterol (PROVENTIL) (2.5 MG/3ML) 0.083% nebulizer solution Take 3 mLs (2.5 mg total) by nebulization every 6 (six) hours as needed for wheezing or shortness of breath.  Marland Kitchen albuterol (VENTOLIN HFA) 108 (90 Base) MCG/ACT inhaler Inhale 2 puffs into the lungs every 6  (six) hours as needed for wheezing or shortness of breath.  . ALPRAZolam (XANAX) 0.25 MG tablet Take 1 tablet (0.25 mg total) by mouth 2 (two) times daily as needed for anxiety.  Marland Kitchen atorvastatin (LIPITOR) 20 MG tablet Take 20 mg by mouth daily.  . busPIRone (BUSPAR) 7.5 MG tablet Take 1 tablet (7.5 mg total) by mouth 3 (three) times daily.  . feeding supplement, ENSURE ENLIVE, (ENSURE ENLIVE) LIQD Take 237 mLs by mouth 2 (two) times daily between meals.  . Fluticasone-Umeclidin-Vilant (TRELEGY ELLIPTA) 100-62.5-25 MCG/INH AEPB Inhale 1 puff into the lungs daily.  Marland Kitchen gabapentin (NEURONTIN) 300 MG  capsule Take 300 mg by mouth 3 (three) times daily.  Marland Kitchen levothyroxine (SYNTHROID, LEVOTHROID) 50 MCG tablet Take 50 mcg by mouth daily.  . nicotine (NICODERM CQ - DOSED IN MG/24 HOURS) 21 mg/24hr patch Place 1 patch (21 mg total) onto the skin daily.  . OXYGEN Inhale 3.5 L into the lungs daily as needed (for shortness of breath). 3-3.5lpm 24/7   . pantoprazole (PROTONIX) 40 MG tablet Take 1 tablet (40 mg total) by mouth at bedtime.  . traMADol (ULTRAM) 50 MG tablet Take 100 mg by mouth 2 (two) times daily as needed.  . traZODone (DESYREL) 100 MG tablet Take 1 tablet (100 mg total) by mouth at bedtime as needed for sleep.  . [DISCONTINUED] amLODipine (NORVASC) 5 MG tablet Take 5 mg by mouth daily.  . [DISCONTINUED] metoprolol tartrate (LOPRESSOR) 25 MG tablet Take 1 tablet (25 mg total) by mouth 2 (two) times daily.     Allergies:   Nsaids; Bee venom; and Codeine   Social History   Tobacco Use  . Smoking status: Former Smoker    Packs/day: 1.00    Years: 20.00    Pack years: 20.00    Types: Cigarettes    Last attempt to quit: 12/28/2017    Years since quitting: 0.3  . Smokeless tobacco: Former Systems developer    Quit date: 06/05/2015  . Tobacco comment: 04/30/16- smoking 1 cig per day//lmr  Substance Use Topics  . Alcohol use: No    Alcohol/week: 0.0 standard drinks    Comment: denies  . Drug use: No      Family Hx: The patient's family history includes Asthma in his sister; Emphysema in his paternal grandfather and paternal grandmother; Heart disease in his father; Stomach cancer in his paternal grandfather.  ROS:   Please see the history of present illness.    General:no colds or fevers though + cough thought to be allergies, has had for 2 weeks, no weight changes Skin:no rashes or ulcers, ecchymosis is clearing. HEENT:no blurred vision, no congestion CV:see HPI PUL:see HPI GI:no diarrhea constipation or melena, no indigestion GU:no hematuria, no dysuria MS:no joint pain, no claudication Neuro:no syncope, no lightheadedness Endo:no diabetes, no thyroid disease  All other systems reviewed and are negative.   Prior CV studies:   The following studies were reviewed today:  Echo 01/06/18 Study Conclusions  - Left ventricle: The cavity size was normal. Wall thickness was   increased in a pattern of moderate LVH. Systolic function was   normal. The estimated ejection fraction was in the range of 55%   to 60%. - Aortic valve: Mildly calcified annulus. Mildly thickened   leaflets. Valve area (VTI): 2.26 cm^2. Valve area (Vmax): 2.26   cm^2. - Mitral valve: Mildly calcified annulus. Mildly thickened leaflets   . - Technically difficult study.  Labs/Other Tests and Data Reviewed:    EKG:  An ECG dated 01/10/2018 was personally reviewed today and demonstrated:  SR with T wave inversion lateral leads  Recent Labs: 01/05/2018: B Natriuretic Peptide 422.0 01/13/2018: Magnesium 1.6 01/15/2018: ALT 26 01/16/2018: BUN 25; Creatinine, Ser 0.80; Hemoglobin 9.4; Platelets 212; Potassium 3.9; Sodium 139   Recent Lipid Panel Lab Results  Component Value Date/Time   CHOL 204 (H) 03/15/2015 05:58 AM   TRIG 116 04/09/2015 08:02 AM   HDL 29 (L) 03/15/2015 05:58 AM   CHOLHDL 7.0 03/15/2015 05:58 AM   LDLCALC 149 (H) 03/15/2015 05:58 AM    Wt Readings from Last 3 Encounters:  04/30/18  165 lb (74.8 kg)  03/09/18 162 lb (73.5 kg)  02/24/18 160 lb (72.6 kg)     Objective:    Vital Signs:  BP (!) 170/98   Pulse 77   Ht 5\' 8"  (1.727 m)   Wt 165 lb (74.8 kg)   BMI 25.09 kg/m     male in no acute distress. Lungs with no wheezes but + cough and mild SOB. Pleasant affect.   ASSESSMENT & PLAN:    1. CAD with hx CABG in 2009 and graft failure in 2010.  Though patent LIMA to LAD, VG-D1 and free RIMA to Ramus.  In 2017 he had stent to RCA.  Now post hospitalization for NSTEMI and plan for cardiac cath but developed retroperitoneal bleed.   Now with chest pain and radiation to Lt arm.  He has not taken NTG sl but he does have.  His BP is poorly controlled as well will adjust meds and check with [name] on timing of cath now his bleed has stopped.  Will check his BMP in prep for cath as well. 2. Uncontrolled HTN, increase amlodipine to 10 mg daily and lopressor to 25 mg TID (he preferred TID to 1.5 tabs BID)  Will do follow up phone visit in 2 weeks to check BP  3. Recent retroperitoneal bleed will check CBC this week. 4. HLD continue statin  5. Chronic type B dissection aortic, and 4.9 cm infrarenal AAA followed by vascular. Needs BP control  6. COPD per PCP and on home oxygen currently with increased cough thought to be allergies, no fever.  To call PCP and see if he can obtain samples of his inhaler. 7. Chronic pain    COVID-19 Education: The signs and symptoms of COVID-19 were discussed with the patient and how to seek care for testing (follow up with PCP or arrange E-visit).  The importance of social distancing was discussed today.  Time:   Today, I have spent 13 minutes with the patient with telehealth technology discussing the above problems.     Medication Adjustments/Labs and Tests Ordered: Current medicines are reviewed at length with the patient today.  Concerns regarding medicines are outlined above.  Tests Ordered: Orders Placed This Encounter  Procedures   . Basic metabolic panel  . CBC   Medication Changes: Meds ordered this encounter  Medications  . amLODipine (NORVASC) 10 MG tablet    Sig: Take 1 tablet (10 mg total) by mouth daily.    Dispense:  180 tablet    Refill:  3  . metoprolol tartrate (LOPRESSOR) 25 MG tablet    Sig: Take 1 tablet (25 mg total) by mouth 2 (two) times daily.    Dispense:  180 tablet    Refill:  3    Disposition:  Follow up in 2 week(s) with me on phone visit and 2 months with [name].   Signed, Cecilie Kicks, NP  04/30/2018 2:08 PM    [facility]

## 2018-04-30 ENCOUNTER — Telehealth (INDEPENDENT_AMBULATORY_CARE_PROVIDER_SITE_OTHER): Payer: Medicare HMO | Admitting: Cardiology

## 2018-04-30 ENCOUNTER — Other Ambulatory Visit: Payer: Self-pay

## 2018-04-30 ENCOUNTER — Encounter: Payer: Self-pay | Admitting: Cardiology

## 2018-04-30 VITALS — BP 170/98 | HR 77 | Ht 68.0 in | Wt 165.0 lb

## 2018-04-30 DIAGNOSIS — Z951 Presence of aortocoronary bypass graft: Secondary | ICD-10-CM

## 2018-04-30 DIAGNOSIS — E782 Mixed hyperlipidemia: Secondary | ICD-10-CM

## 2018-04-30 DIAGNOSIS — I10 Essential (primary) hypertension: Secondary | ICD-10-CM

## 2018-04-30 DIAGNOSIS — E785 Hyperlipidemia, unspecified: Secondary | ICD-10-CM | POA: Diagnosis not present

## 2018-04-30 DIAGNOSIS — F17211 Nicotine dependence, cigarettes, in remission: Secondary | ICD-10-CM | POA: Diagnosis not present

## 2018-04-30 DIAGNOSIS — D62 Acute posthemorrhagic anemia: Secondary | ICD-10-CM

## 2018-04-30 DIAGNOSIS — I714 Abdominal aortic aneurysm, without rupture, unspecified: Secondary | ICD-10-CM

## 2018-04-30 DIAGNOSIS — Z79899 Other long term (current) drug therapy: Secondary | ICD-10-CM

## 2018-04-30 DIAGNOSIS — N179 Acute kidney failure, unspecified: Secondary | ICD-10-CM

## 2018-04-30 DIAGNOSIS — I25700 Atherosclerosis of coronary artery bypass graft(s), unspecified, with unstable angina pectoris: Secondary | ICD-10-CM

## 2018-04-30 DIAGNOSIS — J449 Chronic obstructive pulmonary disease, unspecified: Secondary | ICD-10-CM

## 2018-04-30 DIAGNOSIS — Z8679 Personal history of other diseases of the circulatory system: Secondary | ICD-10-CM

## 2018-04-30 MED ORDER — AMLODIPINE BESYLATE 10 MG PO TABS: 10.0000 mg | ORAL_TABLET | Freq: Every day | ORAL | 3 refills | Status: DC

## 2018-04-30 MED ORDER — METOPROLOL TARTRATE 25 MG PO TABS: 25.0000 mg | ORAL_TABLET | Freq: Two times a day (BID) | ORAL | 3 refills | Status: DC

## 2018-04-30 NOTE — Patient Instructions (Addendum)
Medication Instructions:  Your physician has recommended you make the following change in your medication:  1.  INCREASE the Lopressor to 25 mg taking 1 tablet three times a day 2.  INCREASE the Amlodipine to 10 mg taking 1 tablet daily  If you need a refill on your cardiac medications before your next appointment, please call your pharmacy.   Lab work: Monday, 05/04/2018  CBC & BMET  If you have labs (blood work) drawn today and your tests are completely normal, you will receive your results only by: Marland Kitchen MyChart Message (if you have MyChart) OR . A paper copy in the mail If you have any lab test that is abnormal or we need to change your treatment, we will call you to review the results.  Testing/Procedures: None ordered  Follow-Up: Your physician recommends that you schedule a follow-up appointment in: [place] PHONE VISIT ON 05/20/2018 AT 1:30 P.M.  At [facility], you and your health needs are our priority.  As part of our continuing mission to provide you with exceptional heart care, we have created designated Provider Care Teams.  These Care Teams include your primary Cardiologist (physician) and Advanced Practice Providers (APPs -  Physician Assistants and Nurse Practitioners) who all work together to provide you with the care you need, when you need it. You will need a follow up appointment in 2 months.  You may see Candee Furbish, MD or one of the following Advanced Practice Providers on your designated Care Team:   Truitt Merle, NP Cecilie Kicks, NP . [name], NP  Any Other Special Instructions Will Be Listed Below (If Applicable).

## 2018-05-04 ENCOUNTER — Other Ambulatory Visit: Payer: Self-pay

## 2018-05-05 MED ORDER — METOPROLOL TARTRATE 25 MG PO TABS: 25.0000 mg | ORAL_TABLET | Freq: Three times a day (TID) | ORAL | 3 refills | Status: AC

## 2018-05-05 NOTE — Addendum Note (Signed)
Addended by: Gaetano Net on: 05/05/2018 11:46 AM   Modules accepted: Orders

## 2018-05-06 NOTE — Progress Notes (Signed)
Would proceed with cath after phone visit if you feel he still needs it.  Candee Furbish, MD

## 2018-05-07 ENCOUNTER — Telehealth: Payer: Medicare HMO | Admitting: Cardiology

## 2018-05-07 DIAGNOSIS — R269 Unspecified abnormalities of gait and mobility: Secondary | ICD-10-CM | POA: Diagnosis not present

## 2018-05-20 ENCOUNTER — Other Ambulatory Visit: Payer: Self-pay

## 2018-05-20 ENCOUNTER — Encounter: Payer: Self-pay | Admitting: *Deleted

## 2018-05-20 ENCOUNTER — Telehealth: Payer: Medicare HMO | Admitting: Cardiology

## 2018-05-20 NOTE — Progress Notes (Signed)
Unable to reach pt no visit done

## 2018-06-06 DIAGNOSIS — R269 Unspecified abnormalities of gait and mobility: Secondary | ICD-10-CM | POA: Diagnosis not present

## 2018-07-07 DIAGNOSIS — R269 Unspecified abnormalities of gait and mobility: Secondary | ICD-10-CM | POA: Diagnosis not present

## 2018-07-15 ENCOUNTER — Other Ambulatory Visit: Payer: Self-pay | Admitting: *Deleted

## 2018-07-15 ENCOUNTER — Telehealth: Payer: Self-pay

## 2018-07-15 DIAGNOSIS — I71019 Dissection of thoracic aorta, unspecified: Secondary | ICD-10-CM

## 2018-07-15 DIAGNOSIS — I7101 Dissection of thoracic aorta: Secondary | ICD-10-CM

## 2018-07-15 NOTE — Telephone Encounter (Signed)

## 2018-07-20 ENCOUNTER — Telehealth (INDEPENDENT_AMBULATORY_CARE_PROVIDER_SITE_OTHER): Payer: Medicare HMO | Admitting: Cardiology

## 2018-07-20 ENCOUNTER — Other Ambulatory Visit: Payer: Self-pay

## 2018-07-20 VITALS — Ht 70.0 in | Wt 165.0 lb

## 2018-07-20 DIAGNOSIS — I714 Abdominal aortic aneurysm, without rupture, unspecified: Secondary | ICD-10-CM

## 2018-07-20 DIAGNOSIS — I25700 Atherosclerosis of coronary artery bypass graft(s), unspecified, with unstable angina pectoris: Secondary | ICD-10-CM

## 2018-07-20 DIAGNOSIS — Z8679 Personal history of other diseases of the circulatory system: Secondary | ICD-10-CM

## 2018-07-20 DIAGNOSIS — I119 Hypertensive heart disease without heart failure: Secondary | ICD-10-CM

## 2018-07-20 DIAGNOSIS — I10 Essential (primary) hypertension: Secondary | ICD-10-CM

## 2018-07-20 NOTE — Patient Instructions (Signed)
Medication Instructions:  The current medical regimen is effective;  continue present plan and medications.  If you need a refill on your cardiac medications before your next appointment, please call your pharmacy.   Follow-Up: Follow up in 6 months with Laura Ingold, NP and 1 year with Dr. Skains.  You will receive a letter in the mail 2 months before you are due.  Please call us when you receive this letter to schedule your follow up appointment.  Thank you for choosing [facility]!!     

## 2018-07-20 NOTE — Progress Notes (Signed)
Virtual Visit via Telephone Note   This visit type was conducted due to national recommendations for restrictions regarding the COVID-19 Pandemic (e.g. social distancing) in an effort to limit this patient's exposure and mitigate transmission in our community.  Due to his co-morbid illnesses, this patient is at least at moderate risk for complications without adequate follow up.  This format is felt to be most appropriate for this patient at this time.  The patient did not have access to video technology/had technical difficulties with video requiring transitioning to audio format only (telephone).  All issues noted in this document were discussed and addressed.  No physical exam could be performed with this format.  Please refer to the patient's chart for his  consent to telehealth for [facility].   Date:  07/20/2018   ID:  Beatrix [name], DOB Dec 29, 1950, MRN [id number]  Patient Location: Home Provider Location: Home  PCP:  Wenda Low, MD  Cardiologist:  Candee Furbish, MD  Electrophysiologist:  None   Evaluation Performed:  Follow-Up Visit  Chief Complaint: Fatigue  History of Present Illness:    [name] is a 68 y.o. male with coronary artery disease status post CABG with last cardiac catheterization in 2017 reviewed showing patent LIMA who in December 2019 was hospitalized and found to have retroperitoneal bleed, COPD, flat but elevated troponin.  He did not undergo cardiac catheterization at that time because of bleed.  He feels tunnel vision, marked fatigue, wobbly when standing up at times.  Denies any chest pain.  He has been following with Dr. Roxan Hockey and Dr. Donnetta Hutching watching his aortic aneurysm/dissections.  Smoking has decreased.  The patient does not have symptoms concerning for COVID-19 infection (fever, chills, cough, or new shortness of breath).    Past Medical History:  Diagnosis Date  . Anemia   . Anxiety   . Aortic dissection (HCC)    TYPE 3  . CAD  (coronary artery disease)    multiple, most recently Jan-March 2017  . Cardiac tamponade   . Chronic bronchitis ([place])   . Chronic fatigue   . Chronic pain   . Constipation   . COPD (chronic obstructive pulmonary disease) ([place])   . Depression   . Dysphagia, oropharyngeal phase   . ETOH abuse   . GERD (gastroesophageal reflux disease)   . Hiatal hernia   . HTN (hypertension)   . Hypercholesterolemia   . Metabolic encephalopathy   . Opioid abuse ([place])   . Peptic ulcer disease 08/2010   EGD   . PNA (pneumonia)   . Schatzki's ring    Past Surgical History:  Procedure Laterality Date  . BACK SURGERY    . CABG X 6  07/31/2007   [facility]  . CARDIAC CATHETERIZATION    . CARDIAC CATHETERIZATION N/A 03/16/2015   Procedure: Coronary/Graft Angiography;  Surgeon: Sherren Mocha, MD;  Location: [place] [facility];  Service: Cardiovascular;  Laterality: N/A;  . CARDIAC CATHETERIZATION N/A 03/16/2015   Procedure: Coronary Stent Intervention;  Surgeon: Sherren Mocha, MD;  Location: [place] [facility];  Service: Cardiovascular;  Laterality: N/A;  . KNEE SURGERY Right    acl  . OLECRANON BURSECTOMY Left 08/27/2012   Procedure: EXCISION LEFT OLECRANON BURSA;  Surgeon: Sanjuana Kava, MD;  Location: [facility];  Service: Orthopedics;  Laterality: Left;  . OLECRANON BURSECTOMY Left 05/25/2013   Procedure:  Excision sinus tract and tissue left elbow ;  Surgeon: Sanjuana Kava, MD;  Location: [facility];  Service: Orthopedics;  Laterality: Left;  . ORIF SCAPULAR FRACTURE Right   . SHOULDER SURGERY Left   . STERNAL WIRE REMOVEAL  12/31/2007   [facility]     Current Meds  Medication Sig  . albuterol (PROVENTIL) (2.5 MG/3ML) 0.083% nebulizer solution Take 3 mLs (2.5 mg total) by nebulization every 6 (six) hours as needed for wheezing or shortness of breath.  Marland Kitchen albuterol (VENTOLIN HFA) 108 (90 Base) MCG/ACT inhaler Inhale 2 puffs into the lungs every 6 (six) hours as needed for wheezing or shortness of breath.   . ALPRAZolam (XANAX) 0.25 MG tablet Take 1 tablet (0.25 mg total) by mouth 2 (two) times daily as needed for anxiety.  Marland Kitchen amLODipine (NORVASC) 10 MG tablet Take 1 tablet (10 mg total) by mouth daily.  Marland Kitchen atorvastatin (LIPITOR) 20 MG tablet Take 20 mg by mouth daily.  . busPIRone (BUSPAR) 7.5 MG tablet Take 1 tablet (7.5 mg total) by mouth 3 (three) times daily.  . feeding supplement, ENSURE ENLIVE, (ENSURE ENLIVE) LIQD Take 237 mLs by mouth 2 (two) times daily between meals.  . Fluticasone-Umeclidin-Vilant (TRELEGY ELLIPTA) 100-62.5-25 MCG/INH AEPB Inhale 1 puff into the lungs daily.  Marland Kitchen gabapentin (NEURONTIN) 300 MG capsule Take 300 mg by mouth 3 (three) times daily.  Marland Kitchen levothyroxine (SYNTHROID, LEVOTHROID) 50 MCG tablet Take 50 mcg by mouth daily.  . metoprolol tartrate (LOPRESSOR) 25 MG tablet Take 1 tablet (25 mg total) by mouth 3 (three) times daily.  . OXYGEN Inhale 3.5 L into the lungs daily as needed (for shortness of breath). 3-3.5lpm 24/7   . pantoprazole (PROTONIX) 40 MG tablet Take 1 tablet (40 mg total) by mouth at bedtime.  . traMADol (ULTRAM) 50 MG tablet Take 100 mg by mouth 2 (two) times daily as needed.  . traZODone (DESYREL) 100 MG tablet Take 1 tablet (100 mg total) by mouth at bedtime as needed for sleep.     Allergies:   Nsaids, Bee venom, and Codeine   Social History   Tobacco Use  . Smoking status: Former Smoker    Packs/day: 1.00    Years: 20.00    Pack years: 20.00    Types: Cigarettes    Quit date: 12/28/2017    Years since quitting: 0.5  . Smokeless tobacco: Former Systems developer    Quit date: 06/05/2015  . Tobacco comment: 04/30/16- smoking 1 cig per day//lmr  Substance Use Topics  . Alcohol use: No    Alcohol/week: 0.0 standard drinks    Comment: denies  . Drug use: No     Family Hx: The patient's family history includes Asthma in his sister; Emphysema in his paternal grandfather and paternal grandmother; Heart disease in his father; Stomach cancer in his paternal  grandfather.  ROS:   Please see the history of present illness.    Denies any fevers chills nausea vomiting syncope bleeding All other systems reviewed and are negative.   Prior CV studies:   The following studies were reviewed today:  Echocardiogram showed normal EF in 2019.  Labs/Other Tests and Data Reviewed:    EKG:  Sinus rhythm with nonspecific ST-T wave changes  Recent Labs: 01/05/2018: B Natriuretic Peptide 422.0 01/13/2018: Magnesium 1.6 01/15/2018: ALT 26 01/16/2018: BUN 25; Creatinine, Ser 0.80; Hemoglobin 9.4; Platelets 212; Potassium 3.9; Sodium 139   Recent Lipid Panel Lab Results  Component Value Date/Time   CHOL 204 (H) 03/15/2015 05:58 AM   TRIG 116 04/09/2015 08:02 AM   HDL 29 (L) 03/15/2015 05:58 AM   CHOLHDL 7.0 03/15/2015  05:58 AM   LDLCALC 149 (H) 03/15/2015 05:58 AM    Wt Readings from Last 3 Encounters:  07/20/18 165 lb (74.8 kg)  04/30/18 165 lb (74.8 kg)  03/09/18 162 lb (73.5 kg)     Objective:    Vital Signs:  Ht 5\' 10"  (1.778 m)   Wt 165 lb (74.8 kg)   BMI 23.68 kg/m    VITAL SIGNS:  reviewed pleasant, alert, able to complete full sentences without difficulty.  ASSESSMENT & PLAN:    Coronary artery disease post CABG - Prior cardiac catheterization reviewed in 2017.  He was hospitalized in December 2019 with elevated troponin relatively flat.  Retroperitoneal bleed.  As of right now, he is not having any anginal chest discomfort.  He is overall fatigued as usual.  I would like to continue with medical management and hold off on cardiac catheterization.  Aortopathy - Dr. Roxan Hockey and Dr. Donnetta Hutching are watching.  AAA 4.9 cm.  Maximize blood pressure control.  Essential hypertension - Blood pressures still remain very elevated however when he stands he has tunnel vision and feels near faint.  A case manager took his blood pressure when standing and is top number went down by 100 points he states.  Marked orthostasis.  I encouraged him  to purchase another arm cuff blood pressure.  We will keep an eye on this.  COVID-19 Education: The signs and symptoms of COVID-19 were discussed with the patient and how to seek care for testing (follow up with PCP or arrange E-visit).  The importance of social distancing was discussed today.  Time:   Today, I have spent 18 minutes with the patient with telehealth technology discussing the above problems.     Medication Adjustments/Labs and Tests Ordered: Current medicines are reviewed at length with the patient today.  Concerns regarding medicines are outlined above.   Tests Ordered: No orders of the defined types were placed in this encounter.   Medication Changes: No orders of the defined types were placed in this encounter.   Follow Up:  Virtual Visit or In Person in 6 month(s)  Signed, Candee Furbish, MD  07/20/2018 10:32 AM    [place]

## 2018-07-31 DIAGNOSIS — E7800 Pure hypercholesterolemia, unspecified: Secondary | ICD-10-CM | POA: Diagnosis not present

## 2018-07-31 DIAGNOSIS — Z125 Encounter for screening for malignant neoplasm of prostate: Secondary | ICD-10-CM | POA: Diagnosis not present

## 2018-07-31 DIAGNOSIS — M48061 Spinal stenosis, lumbar region without neurogenic claudication: Secondary | ICD-10-CM | POA: Diagnosis not present

## 2018-07-31 DIAGNOSIS — J449 Chronic obstructive pulmonary disease, unspecified: Secondary | ICD-10-CM | POA: Diagnosis not present

## 2018-07-31 DIAGNOSIS — E039 Hypothyroidism, unspecified: Secondary | ICD-10-CM | POA: Diagnosis not present

## 2018-07-31 DIAGNOSIS — Z0000 Encounter for general adult medical examination without abnormal findings: Secondary | ICD-10-CM | POA: Diagnosis not present

## 2018-07-31 DIAGNOSIS — I10 Essential (primary) hypertension: Secondary | ICD-10-CM | POA: Diagnosis not present

## 2018-07-31 DIAGNOSIS — K219 Gastro-esophageal reflux disease without esophagitis: Secondary | ICD-10-CM | POA: Diagnosis not present

## 2018-07-31 DIAGNOSIS — Z1389 Encounter for screening for other disorder: Secondary | ICD-10-CM | POA: Diagnosis not present

## 2018-08-25 ENCOUNTER — Other Ambulatory Visit: Payer: Medicare HMO

## 2018-09-01 ENCOUNTER — Ambulatory Visit (INDEPENDENT_AMBULATORY_CARE_PROVIDER_SITE_OTHER): Payer: Medicare HMO | Admitting: Thoracic Surgery (Cardiothoracic Vascular Surgery)

## 2018-09-01 ENCOUNTER — Ambulatory Visit
Admission: RE | Admit: 2018-09-01 | Discharge: 2018-09-01 | Disposition: A | Discharge status: Home / Self Care | Payer: Medicare HMO | Source: Ambulatory Visit | Attending: Thoracic Surgery (Cardiothoracic Vascular Surgery) | Admitting: Thoracic Surgery (Cardiothoracic Vascular Surgery)

## 2018-09-01 ENCOUNTER — Other Ambulatory Visit: Payer: Self-pay | Admitting: *Deleted

## 2018-09-01 ENCOUNTER — Encounter: Payer: Self-pay | Admitting: Thoracic Surgery (Cardiothoracic Vascular Surgery)

## 2018-09-01 ENCOUNTER — Encounter: Payer: Medicare HMO | Admitting: Thoracic Surgery (Cardiothoracic Vascular Surgery)

## 2018-09-01 ENCOUNTER — Other Ambulatory Visit: Payer: Self-pay

## 2018-09-01 VITALS — BP 139/77 | HR 71 | Temp 97.7°F | Resp 16 | Ht 68.0 in | Wt 158.8 lb

## 2018-09-01 DIAGNOSIS — I71019 Dissection of thoracic aorta, unspecified: Secondary | ICD-10-CM

## 2018-09-01 DIAGNOSIS — I7101 Dissection of thoracic aorta: Secondary | ICD-10-CM

## 2018-09-01 DIAGNOSIS — I71012 Dissection of descending thoracic aorta: Secondary | ICD-10-CM

## 2018-09-01 MED ORDER — IOPAMIDOL (ISOVUE-370) INJECTION 76%
75.0000 mL | Freq: Once | INTRAVENOUS | Status: AC | PRN
  Administered 2018-09-01: 75 mL via INTRAVENOUS

## 2018-09-01 NOTE — Progress Notes (Signed)
[address]       [place] [id number]             [phone]    HPI: [name] returns for a scheduled follow-up visit  [name] is a 68 year old man with a history of coronary disease, CABG in 2009, type III aortic dissection in 2008, descending thoracic and abdominal aortic aneurysms, former tobacco abuse (quit December 2019), COPD, ethanol abuse, chronic back pain, anxiety, and depression.  I have been following him for his type III dissection since 2008.  In December 2018 he was hospitalized with chest pain and had mildly elevated troponins.  He had a retroperitoneal bleed and cardiac catheterization was not done.   I saw him in February.  His descending aneurysm was stable.  His abdominal aneurysm had increased in size.  He saw Dr. early in February.  He did not need repair but will need continued follow-up.  In the interim since his last visit he is having frequent angina.  This occurs even when he just walks to his mailbox.  It usually resolves with rest.  He has had to use nitroglycerin a couple of times.  He talked to [name] in June but has not talked to him since he started having more angina.  Past Medical History:  Diagnosis Date   Anemia    Anxiety    Aortic dissection (HCC)    TYPE 3   CAD (coronary artery disease)    multiple, most recently Jan-March 2017   Cardiac tamponade    Chronic bronchitis (HCC)    Chronic fatigue    Chronic pain    Constipation    COPD (chronic obstructive pulmonary disease) (HCC)    Depression    Dysphagia, oropharyngeal phase    ETOH abuse    GERD (gastroesophageal reflux disease)    Hiatal hernia    HTN (hypertension)    Hypercholesterolemia    Metabolic encephalopathy    Opioid abuse (HCC)    Peptic ulcer disease 08/2010   EGD    PNA (pneumonia)    Schatzki's ring     Current Outpatient Medications  Medication Sig Dispense Refill   albuterol (PROVENTIL) (2.5 MG/3ML) 0.083%  nebulizer solution Take 3 mLs (2.5 mg total) by nebulization every 6 (six) hours as needed for wheezing or shortness of breath.     albuterol (VENTOLIN HFA) 108 (90 Base) MCG/ACT inhaler Inhale 2 puffs into the lungs every 6 (six) hours as needed for wheezing or shortness of breath.     ALPRAZolam (XANAX) 0.25 MG tablet Take 1 tablet (0.25 mg total) by mouth 2 (two) times daily as needed for anxiety. 20 tablet 0   amLODipine (NORVASC) 10 MG tablet Take 1 tablet (10 mg total) by mouth daily. 180 tablet 3   atorvastatin (LIPITOR) 20 MG tablet Take 20 mg by mouth daily.     busPIRone (BUSPAR) 7.5 MG tablet Take 1 tablet (7.5 mg total) by mouth 3 (three) times daily. 45 tablet 0   feeding supplement, ENSURE ENLIVE, (ENSURE ENLIVE) LIQD Take 237 mLs by mouth 2 (two) times daily between meals.     Fluticasone-Umeclidin-Vilant (TRELEGY ELLIPTA) 100-62.5-25 MCG/INH AEPB Inhale 1 puff into the lungs daily.     gabapentin (NEURONTIN) 300 MG capsule Take 300 mg by mouth 3 (three) times daily.     levothyroxine (SYNTHROID, LEVOTHROID) 50 MCG tablet Take 50 mcg by mouth daily.     metoprolol tartrate (LOPRESSOR) 25 MG  tablet Take 1 tablet (25 mg total) by mouth 3 (three) times daily. 270 tablet 3   OXYGEN Inhale 3.5 L into the lungs daily as needed (for shortness of breath). 3-3.5lpm 24/7      pantoprazole (PROTONIX) 40 MG tablet Take 1 tablet (40 mg total) by mouth at bedtime. 30 tablet 0   traMADol (ULTRAM) 50 MG tablet Take 100 mg by mouth 2 (two) times daily as needed.     traZODone (DESYREL) 100 MG tablet Take 1 tablet (100 mg total) by mouth at bedtime as needed for sleep. 30 tablet 0   No current facility-administered medications for this visit.     Physical Exam BP 139/77 (BP Location: Right Arm, Patient Position: Sitting, Cuff Size: Normal)    Pulse 71    Temp 97.7 F (36.5 C)    Resp 16    Ht 5\' 8"  (1.727 m)    Wt 158 lb 12.8 oz (72 kg)    SpO2 93% Comment: RA   BMI 24.79 kg/m    68 year old man in no acute distress Alert and oriented x3 with no focal deficits Lungs clear with equal breath sounds bilaterally Cardiac regular rhythm No peripheral edema  Diagnostic Tests: CT ANGIOGRAPHY CHEST, ABDOMEN AND PELVIS  TECHNIQUE: Multidetector CT imaging through the chest, abdomen and pelvis was performed using the standard protocol during bolus administration of intravenous contrast. Multiplanar reconstructed images and MIPs were obtained and reviewed to evaluate the vascular anatomy.  CONTRAST:  79mL ISOVUE-370 IOPAMIDOL (ISOVUE-370) INJECTION 76%  COMPARISON:  02/20/2018 and previous  FINDINGS: CTA CHEST FINDINGS  Cardiovascular: Heart size normal. No pericardial effusion. Satisfactory opacification of pulmonary arteries noted, and there is no evidence of pulmonary emboli. Coronary calcifications. Previous CABG. Chronic dissection extending through the distal arch and descending thoracic segment. No definite enhancement of the false lumen. No significant compromise of the true lumen. Dilatation of the distal arch up to 4.6cm diameter (stable by my measurement), with tapering of the distal descending segment to 3.8 cm. No evidence of rupture.  Mediastinum/Nodes: No hilar or mediastinal adenopathy. No mediastinal hemorrhage.  Lungs/Pleura: No pleural effusion. No pneumothorax. Pulmonary emphysema.  Musculoskeletal: Previous median sternotomy. No acute fracture or worrisome bone lesion.  Review of the MIP images confirms the above findings.  CTA ABDOMEN AND PELVIS FINDINGS  VASCULAR  Aorta: The dissection continues into the abdominal aorta with thrombosis of the false lumen, no significant compromise of the true lumen. Fusiform infrarenal aortic aneurysm 4.9 x 4.4 cm maximum transverse dimensions, stable. Eccentric mural thrombus in the aneurysmal segment. Aorta tapers to a diameter of 3.7 cm above the bifurcation.  Celiac:  Patent  SMA: Patent  Renals: Single left, with proximal stenosis over the first 1.6 cm, patent distally.  Single right, patent.  IMA: Origin occlusion or high-grade stenosis, reconstituted distally by visceral collaterals.  Inflow: On the right, mild narrowing of the common iliac origin. The distal right common iliac is ectatic up to 1.4 cm diameter. Internal and external iliac arteries patent.w  On the left, dilated common iliac artery up to 1.6 cm diameter. External iliac patent.  Visualized proximal runoff patent bilaterally.  Veins: No obvious venous abnormality within the limitations of this arterial phase study. Patent bilateral renal veins.  Review of the MIP images confirms the above findings.  NON-VASCULAR  Hepatobiliary: No focal liver abnormality is seen. No gallstones, gallbladder wall thickening, or biliary dilatation.  Pancreas: Unremarkable. No pancreatic ductal dilatation or surrounding inflammatory changes.  Spleen: Normal  in size without focal abnormality.  Adrenals/Urinary Tract: Unremarkable adrenals. No renal mass or hydronephrosis. Urinary bladder incompletely distended.  Stomach/Bowel: Stomach is nondistended. Small bowel decompressed. Normal appendix. The colon is nondilated, unremarkable.  Lymphatic: No abdominal or pelvic adenopathy.  Reproductive: Prostate is unremarkable.  Other: No ascites. No free air.  Musculoskeletal: Instrumented PLIF L4-S1, hardware resulting in some regional streak artifact. Adjacent level degenerative disc disease L3-4. no fracture or worrisome bone lesion.  Review of the MIP images confirms the above findings.  IMPRESSION: 1. Stable type B aortic dissection with 4.6 cm dilatation of the distal aortic arch. Recommend semi-annual imaging followup by CTA or MRA and referral to cardiothoracic surgery if not already obtained. This recommendation follows  2010 ACCF/AHA/AATS/ACR/ASA/SCA/SCAI/SIR/STS/SVM Guidelines for the Diagnosis and Management of Patients With Thoracic Aortic Disease. Circulation. 2010; 121: e266-e36 2. Stable 4.9 cm fusiform infrarenal abdominal aortic aneurysm. Recommend followup by abdomen and pelvis CTA in 6 months, and vascular surgery referral/consultation if not already obtained. This recommendation follows ACR consensus guidelines: White Paper of the ACR Incidental Findings Committee II on Vascular Findings. J Am Coll Radiol 2013; 10:789-794. 3. Dilated common iliac arteries 1.6 cm left, 1.4 cm right. 4. No acute findings.   Electronically Signed   By: [name] M.D.   On: 09/01/2018 12:47 I personally reviewed the CT images and concur with the findings noted above  Impression: [name] is a 68 year old man with multiple significant medical problems including coronary artery disease, coronary artery bypass grafting, type III aortic dissection, descending thoracic aortic aneurysm, abdominal aortic aneurysm, chronic pain, anxiety, depression, remote tobacco abuse, COPD, and ethanol abuse.  Coronary artery disease-status post CABG in 2009.  He is having recurrent angina.  He had a slight troponin bump back in December.  He is been managed medically.  Now with more frequent angina I think he probably needs cardiac catheterization.  We will let [name] know and defer to his judgment.  Descending thoracic aneurysm-in setting of type III aortic dissection.  Stable.  Needs continued semiannual follow-up.  Abdominal aortic aneurysm-stable.  Will be seeing Dr. Donnetta Hutching  Plan: Follow-up as scheduled with Dr. Donnetta Hutching Return in 6 months with CT chest, abdomen and pelvis  Jim Nakayama, MD [facility] [phone]

## 2018-09-08 ENCOUNTER — Telehealth: Payer: Self-pay | Admitting: Cardiology

## 2018-09-08 NOTE — Telephone Encounter (Signed)
-----   Message from Melrose Nakayama, MD sent at 09/01/2018  1:13 PM EDT ----- [name] saw [name] in the office today.  He had troponin bump back in December but developed a retroperitoneal bleed and never had catheterization.  I saw him in the office today and he said he is having frequent angina.  Just about every time he walks to his mailbox.  He also has had an episode that woke him up from his sleep.  I think he probably needs cardiac catheterization but will defer to your judgment.  Thanks  [name]

## 2018-09-08 NOTE — Telephone Encounter (Signed)
Please have him come in and see an APP to get him set up for cardiac catheterization, left radial approach to evaluate his coronary anatomy post bypass.  Thank you. Candee Furbish, MD

## 2018-09-09 NOTE — Telephone Encounter (Signed)
Pt has been scheduled for 8/25/23020 at 1:30 pm.  He is aware of date, time and location.

## 2018-09-09 NOTE — Telephone Encounter (Signed)
Attempted to call pt to schedule appt to be set up for cardiac cath.  Per family member, pt not at home at this time and should be back about 4 pm.  Advised I will c/b then.

## 2018-09-14 NOTE — H&P (View-Only) (Signed)
Cardiology Office Note    Date:  09/15/2018   ID:  [name], [name] 09-25-1950, MRN [id number]  PCP:  Wenda Low, MD  Cardiologist: Candee Furbish, MD EPS: None  Chief Complaint  Patient presents with  . Chest Pain    History of Present Illness:  [name] is a 68 y.o. male with a history of coronary disease, CABG in 2009 last cardiac catheterization in 2017 DES native RCA, patent LIMA-LAD and SVG-Dx and free RIMA-ramus, occluded SVG-PDA and SVG-OM, type III aortic dissection in 2008, descending thoracic and abdominal aortic aneurysms 4.9 cm, former tobacco abuse (quit December 2019), COPD, ethanol abuse, chronic back pain, anxiety, and depression   In December 2019 he was hospitalized with chest pain and had mildly elevated troponins. Plan was for a cath but He had a retroperitoneal bleed and cardiac catheterization was not done.    Had telemedicine visit with [name] 07/20/18 and not having angina but having marked orthostatic BP changes from being high and dropping 100 points with significant dizziness with standing.Marland Kitchen He was asked to get a new BP cuff.  He saw Dr. Roxan Hockey 09/01/18 complaining of chest tightness with little exertion. He discussed with [name] who recommends cardiac catheterization.  Complains of chest tightness into right arm with shortness of breath after walking 40 ft. Can ease within minutes but comes back with little activity. Quick smoking last November but still gets the urge to smoke and uses a nicotine patch.Under a lot of stress with 5 girls living at home youngest is 67 yr old. BP high on Monday and took an extra metoprolol    Past Medical History:  Diagnosis Date  . Anemia   . Anxiety   . Aortic dissection (HCC)    TYPE 3  . CAD (coronary artery disease)    multiple, most recently Jan-March 2017  . Cardiac tamponade   . Chronic bronchitis ([place])   . Chronic fatigue   . Chronic pain   . Constipation   . COPD (chronic obstructive pulmonary  disease) ([place])   . Depression   . Dysphagia, oropharyngeal phase   . ETOH abuse   . GERD (gastroesophageal reflux disease)   . Hiatal hernia   . HTN (hypertension)   . Hypercholesterolemia   . Metabolic encephalopathy   . Opioid abuse ([place])   . Peptic ulcer disease 08/2010   EGD   . PNA (pneumonia)   . Schatzki's ring     Past Surgical History:  Procedure Laterality Date  . BACK SURGERY    . CABG X 6  07/31/2007   [facility]  . CARDIAC CATHETERIZATION    . CARDIAC CATHETERIZATION N/A 03/16/2015   Procedure: Coronary/Graft Angiography;  Surgeon: Sherren Mocha, MD;  Location: [facility];  Service: Cardiovascular;  Laterality: N/A;  . CARDIAC CATHETERIZATION N/A 03/16/2015   Procedure: Coronary Stent Intervention;  Surgeon: Sherren Mocha, MD;  Location: [facility];  Service: Cardiovascular;  Laterality: N/A;  . KNEE SURGERY Right    acl  . OLECRANON BURSECTOMY Left 08/27/2012   Procedure: EXCISION LEFT OLECRANON BURSA;  Surgeon: Sanjuana Kava, MD;  Location: [facility];  Service: Orthopedics;  Laterality: Left;  . OLECRANON BURSECTOMY Left 05/25/2013   Procedure:  Excision sinus tract and tissue left elbow ;  Surgeon: Sanjuana Kava, MD;  Location: [facility];  Service: Orthopedics;  Laterality: Left;  . ORIF SCAPULAR FRACTURE Right   . SHOULDER SURGERY Left   . STERNAL WIRE REMOVEAL  12/31/2007   [facility]    Current Medications: Current Meds  Medication Sig  . albuterol (PROVENTIL) (2.5 MG/3ML) 0.083% nebulizer solution Take 3 mLs (2.5 mg total) by nebulization every 6 (six) hours as needed for wheezing or shortness of breath.  Marland Kitchen albuterol (VENTOLIN HFA) 108 (90 Base) MCG/ACT inhaler Inhale 2 puffs into the lungs every 6 (six) hours as needed for wheezing or shortness of breath.  . ALPRAZolam (XANAX) 0.25 MG tablet Take 1 tablet (0.25 mg total) by mouth 2 (two) times daily as needed for anxiety.  Marland Kitchen atorvastatin (LIPITOR) 20 MG tablet Take 20 mg by mouth daily.  .  busPIRone (BUSPAR) 7.5 MG tablet Take 1 tablet (7.5 mg total) by mouth 3 (three) times daily.  . feeding supplement, ENSURE ENLIVE, (ENSURE ENLIVE) LIQD Take 237 mLs by mouth 2 (two) times daily between meals.  . Fluticasone-Umeclidin-Vilant (TRELEGY ELLIPTA) 100-62.5-25 MCG/INH AEPB Inhale 1 puff into the lungs daily.  Marland Kitchen gabapentin (NEURONTIN) 300 MG capsule Take 300 mg by mouth 3 (three) times daily.  Marland Kitchen levothyroxine (SYNTHROID, LEVOTHROID) 50 MCG tablet Take 50 mcg by mouth daily.  . metoprolol tartrate (LOPRESSOR) 25 MG tablet Take 1 tablet (25 mg total) by mouth 3 (three) times daily.  . OXYGEN Inhale 3.5 L into the lungs daily as needed (for shortness of breath). 3-3.5lpm 24/7   . pantoprazole (PROTONIX) 40 MG tablet Take 1 tablet (40 mg total) by mouth at bedtime.  . traMADol (ULTRAM) 50 MG tablet Take 100 mg by mouth 2 (two) times daily as needed.  . traZODone (DESYREL) 100 MG tablet Take 1 tablet (100 mg total) by mouth at bedtime as needed for sleep.     Allergies:   Nsaids, Bee venom, and Codeine   Social History   Socioeconomic History  . Marital status: Married    Spouse name: Not on file  . Number of children: Not on file  . Years of education: Not on file  . Highest education level: Not on file  Occupational History  . Occupation: Dealer  Social Needs  . Financial resource strain: Not on file  . Food insecurity    Worry: Not on file    Inability: Not on file  . Transportation needs    Medical: Not on file    Non-medical: Not on file  Tobacco Use  . Smoking status: Former Smoker    Packs/day: 1.00    Years: 20.00    Pack years: 20.00    Types: Cigarettes    Quit date: 12/28/2017    Years since quitting: 0.7  . Smokeless tobacco: Former Systems developer    Quit date: 06/05/2015  . Tobacco comment: 04/30/16- smoking 1 cig per day//lmr  Substance and Sexual Activity  . Alcohol use: No    Alcohol/week: 0.0 standard drinks    Comment: denies  . Drug use: No  . Sexual  activity: Not on file  Lifestyle  . Physical activity    Days per week: Not on file    Minutes per session: Not on file  . Stress: Not on file  Relationships  . Social Herbalist on phone: Not on file    Gets together: Not on file    Attends religious service: Not on file    Active member of club or organization: Not on file    Attends meetings of clubs or organizations: Not on file    Relationship status: Not on file  Other Topics Concern  . Not on  file  Social History Narrative            Family History:  The patient's family history includes Asthma in his sister; Emphysema in his paternal grandfather and paternal grandmother; Heart disease in his father; Stomach cancer in his paternal grandfather.   ROS:   Please see the history of present illness.    Review of Systems  Constitution: Negative.  HENT: Negative.   Cardiovascular: Positive for chest pain and dyspnea on exertion.  Respiratory: Negative.   Endocrine: Negative.   Hematologic/Lymphatic: Negative.   Musculoskeletal: Negative.   Gastrointestinal: Negative.   Genitourinary: Negative.   Neurological: Positive for dizziness.  Psychiatric/Behavioral: The patient is nervous/anxious.    All other systems reviewed and are negative.   PHYSICAL EXAM:   VS:  BP 122/70   Pulse 70   Ht 5\' 8"  (1.727 m)   Wt 158 lb (71.7 kg)   SpO2 95%   BMI 24.02 kg/m   Physical Exam  GEN: Thin, in no acute distress  HEENT: normal  Neck: no JVD, carotid bruits, or masses Cardiac:RRR; no murmurs, rubs, or gallops  Respiratory: Decreased breath sounds throughout GI: soft, nontender, nondistended, + BS Ext: without cyanosis, clubbing, or edema, Good distal pulses bilaterally MS: no deformity or atrophy  Skin: warm and dry, no rash Neuro:  Alert and Oriented x 3 Psych: euthymic mood, full affect  Wt Readings from Last 3 Encounters:  09/15/18 158 lb (71.7 kg)  09/01/18 158 lb 12.8 oz (72 kg)  07/20/18 165 lb (74.8  kg)      Studies/Labs Reviewed:   EKG:  EKG is  ordered today.  The ekg ordered today demonstrates normal sinus rhythm with nonspecific ST-T wave changes, no acute change from last EKG  Recent Labs: 01/05/2018: B Natriuretic Peptide 422.0 01/13/2018: Magnesium 1.6 01/15/2018: ALT 26 01/16/2018: BUN 25; Creatinine, Ser 0.80; Hemoglobin 9.4; Platelets 212; Potassium 3.9; Sodium 139   Lipid Panel    Component Value Date/Time   CHOL 204 (H) 03/15/2015 0558   TRIG 116 04/09/2015 0802   HDL 29 (L) 03/15/2015 0558   CHOLHDL 7.0 03/15/2015 0558   VLDL 26 03/15/2015 0558   LDLCALC 149 (H) 03/15/2015 0558    Additional studies/ records that were reviewed today include:   Cardiac cath 2017  SVG .  100% occluded  Origin lesion, 100% stenosed.  Ost RCA to Prox RCA lesion, 50% stenosed.  SVG .  This is a sequential graft with an SVG-diagonal and free RIMA that arises from the body of the SVG and goes to the ramus intermedius. The graft is patent throughout  Mid RCA lesion, 30% stenosed.  LIMA .  Patent graft  SVG .  Chronically occluded  Prox Graft lesion, 100% stenosed.  LM lesion, 50% stenosed.  Prox LAD lesion, 100% stenosed.  Prox Cx lesion, 99% stenosed.  Dist RCA lesion, 95% stenosed. Post intervention, there is a 0% residual stenosis.   1. Severe 3 vessel CAD 2. S/p CABG with continued patency of the LIMA-LAD, SVG-diagonal, and free RIMA-ramus 3. Chronic occlusion of the SVG-PDA and SVG-OM 4. Successful PCI of critical stenosis in the native RCA using a DES platform   ASA/Brilinta x 12 months as tolerated. Should be ok for hospital discharge tomorrow as long as no complications arise.    Echo 12/2019Study Conclusions   - Left ventricle: The cavity size was normal. Wall thickness was   increased in a pattern of moderate LVH. Systolic function was  normal. The estimated ejection fraction was in the range of 55%   to 60%. - Aortic valve: Mildly  calcified annulus. Mildly thickened   leaflets. Valve area (VTI): 2.26 cm^2. Valve area (Vmax): 2.26   cm^2. - Mitral valve: Mildly calcified annulus. Mildly thickened leaflets   . - Technically difficult study.   ASSESSMENT:    1. Coronary artery disease, angina presence unspecified, unspecified vessel or lesion type, unspecified whether native or transplanted heart   2. Dissection of aorta, unspecified portion of aorta ([place])   3. Essential hypertension   4. Hypercholesterolemia      PLAN:  In order of problems listed above:  CAD S/P CABG 2013, DES native RCA 2017 with patent LIMA-LAD, SVG-Dx and free RIMA-ramus, chronic occlusion of SVG-PDA and SVG-OM, chest pain with mild troponin bump 12/2017 cath cancelled secondary to retroperitoneal bleed on IV heparin now with worsening angina for cardiac cath per [name] I have reviewed the risks, indications, and alternatives to angioplasty and stenting with the patient. Risks include but are not limited to bleeding, infection, vascular injury, stroke, myocardial infection, arrhythmia, kidney injury, radiation-related injury in the case of prolonged fluoroscopy use, emergency cardiac surgery, and death. The patient understands the risks of serious complication is low (123456) and patient agrees to proceed.   Type III aortic dissection 2008 descending thoracic and AAA 4.9 cm followed by Dr. Roxan Hockey  HTN with recent BP swings-under a lot of stress at home. bp stable today.   HLD LDL 93 on Lipitor 20 mg daily 07/2018 we will increase his Lipitor to 40 mg daily LFT's in 3 months   Medication Adjustments/Labs and Tests Ordered: Current medicines are reviewed at length with the patient today.  Concerns regarding medicines are outlined above.  Medication changes, Labs and Tests ordered today are listed in the Patient Instructions below. There are no Patient Instructions on file for this visit.   Signed, Ermalinda Barrios, PA-C  09/15/2018 1:59 PM     [facility] [place], [place], [place]  [address] Phone: [phone]; Fax: [phone]

## 2018-09-14 NOTE — Progress Notes (Signed)
Cardiology Office Note    Date:  09/15/2018   ID:  Jim, [name] [birth year], MRN [id number]  PCP:  Wenda Low, MD  Cardiologist: Candee Furbish, MD EPS: None  Chief Complaint  Patient presents with  . Chest Pain    History of Present Illness:  [name] is a 68 y.o. male with a history of coronary disease, CABG in 2009 last cardiac catheterization in 2017 DES native RCA, patent LIMA-LAD and SVG-Dx and free RIMA-ramus, occluded SVG-PDA and SVG-OM, type III aortic dissection in 2008, descending thoracic and abdominal aortic aneurysms 4.9 cm, former tobacco abuse (quit December 2019), COPD, ethanol abuse, chronic back pain, anxiety, and depression   In December 2019 he was hospitalized with chest pain and had mildly elevated troponins. Plan was for a cath but He had a retroperitoneal bleed and cardiac catheterization was not done.    Had telemedicine visit with [name] 07/20/18 and not having angina but having marked orthostatic BP changes from being high and dropping 100 points with significant dizziness with standing.Marland Kitchen He was asked to get a new BP cuff.  He saw Dr. Roxan Hockey 09/01/18 complaining of chest tightness with little exertion. He discussed with [name] who recommends cardiac catheterization.  Complains of chest tightness into right arm with shortness of breath after walking 40 ft. Can ease within minutes but comes back with little activity. Quick smoking last November but still gets the urge to smoke and uses a nicotine patch.Under a lot of stress with 5 girls living at home youngest is 67 yr old. BP high on Monday and took an extra metoprolol    Past Medical History:  Diagnosis Date  . Anemia   . Anxiety   . Aortic dissection (HCC)    TYPE 3  . CAD (coronary artery disease)    multiple, most recently Jan-March 2017  . Cardiac tamponade   . Chronic bronchitis ([place])   . Chronic fatigue   . Chronic pain   . Constipation   . COPD (chronic obstructive pulmonary  disease) ([place])   . Depression   . Dysphagia, oropharyngeal phase   . ETOH abuse   . GERD (gastroesophageal reflux disease)   . Hiatal hernia   . HTN (hypertension)   . Hypercholesterolemia   . Metabolic encephalopathy   . Opioid abuse ([place])   . Peptic ulcer disease 08/2010   EGD   . PNA (pneumonia)   . Schatzki's ring     Past Surgical History:  Procedure Laterality Date  . BACK SURGERY    . CABG X 6  07/31/2007   [facility]  . CARDIAC CATHETERIZATION    . CARDIAC CATHETERIZATION N/A 03/16/2015   Procedure: Coronary/Graft Angiography;  Surgeon: Sherren Mocha, MD;  Location: [facility];  Service: Cardiovascular;  Laterality: N/A;  . CARDIAC CATHETERIZATION N/A 03/16/2015   Procedure: Coronary Stent Intervention;  Surgeon: Sherren Mocha, MD;  Location: [facility];  Service: Cardiovascular;  Laterality: N/A;  . KNEE SURGERY Right    acl  . OLECRANON BURSECTOMY Left 08/27/2012   Procedure: EXCISION LEFT OLECRANON BURSA;  Surgeon: Sanjuana Kava, MD;  Location: [facility];  Service: Orthopedics;  Laterality: Left;  . OLECRANON BURSECTOMY Left 05/25/2013   Procedure:  Excision sinus tract and tissue left elbow ;  Surgeon: Sanjuana Kava, MD;  Location: [facility];  Service: Orthopedics;  Laterality: Left;  . ORIF SCAPULAR FRACTURE Right   . SHOULDER SURGERY Left   . STERNAL WIRE REMOVEAL  12/31/2007   [facility]    Current Medications: Current Meds  Medication Sig  . albuterol (PROVENTIL) (2.5 MG/3ML) 0.083% nebulizer solution Take 3 mLs (2.5 mg total) by nebulization every 6 (six) hours as needed for wheezing or shortness of breath.  Marland Kitchen albuterol (VENTOLIN HFA) 108 (90 Base) MCG/ACT inhaler Inhale 2 puffs into the lungs every 6 (six) hours as needed for wheezing or shortness of breath.  . ALPRAZolam (XANAX) 0.25 MG tablet Take 1 tablet (0.25 mg total) by mouth 2 (two) times daily as needed for anxiety.  Marland Kitchen atorvastatin (LIPITOR) 20 MG tablet Take 20 mg by mouth daily.  .  busPIRone (BUSPAR) 7.5 MG tablet Take 1 tablet (7.5 mg total) by mouth 3 (three) times daily.  . feeding supplement, ENSURE ENLIVE, (ENSURE ENLIVE) LIQD Take 237 mLs by mouth 2 (two) times daily between meals.  . Fluticasone-Umeclidin-Vilant (TRELEGY ELLIPTA) 100-62.5-25 MCG/INH AEPB Inhale 1 puff into the lungs daily.  Marland Kitchen gabapentin (NEURONTIN) 300 MG capsule Take 300 mg by mouth 3 (three) times daily.  Marland Kitchen levothyroxine (SYNTHROID, LEVOTHROID) 50 MCG tablet Take 50 mcg by mouth daily.  . metoprolol tartrate (LOPRESSOR) 25 MG tablet Take 1 tablet (25 mg total) by mouth 3 (three) times daily.  . OXYGEN Inhale 3.5 L into the lungs daily as needed (for shortness of breath). 3-3.5lpm 24/7   . pantoprazole (PROTONIX) 40 MG tablet Take 1 tablet (40 mg total) by mouth at bedtime.  . traMADol (ULTRAM) 50 MG tablet Take 100 mg by mouth 2 (two) times daily as needed.  . traZODone (DESYREL) 100 MG tablet Take 1 tablet (100 mg total) by mouth at bedtime as needed for sleep.     Allergies:   Nsaids, Bee venom, and Codeine   Social History   Socioeconomic History  . Marital status: Married    Spouse name: Not on file  . Number of children: Not on file  . Years of education: Not on file  . Highest education level: Not on file  Occupational History  . Occupation: Dealer  Social Needs  . Financial resource strain: Not on file  . Food insecurity    Worry: Not on file    Inability: Not on file  . Transportation needs    Medical: Not on file    Non-medical: Not on file  Tobacco Use  . Smoking status: Former Smoker    Packs/day: 1.00    Years: 20.00    Pack years: 20.00    Types: Cigarettes    Quit date: 12/28/2017    Years since quitting: 0.7  . Smokeless tobacco: Former Systems developer    Quit date: 06/05/2015  . Tobacco comment: 04/30/16- smoking 1 cig per day//lmr  Substance and Sexual Activity  . Alcohol use: No    Alcohol/week: 0.0 standard drinks    Comment: denies  . Drug use: No  . Sexual  activity: Not on file  Lifestyle  . Physical activity    Days per week: Not on file    Minutes per session: Not on file  . Stress: Not on file  Relationships  . Social Herbalist on phone: Not on file    Gets together: Not on file    Attends religious service: Not on file    Active member of club or organization: Not on file    Attends meetings of clubs or organizations: Not on file    Relationship status: Not on file  Other Topics Concern  . Not on  file  Social History Narrative            Family History:  The patient's family history includes Asthma in his sister; Emphysema in his paternal grandfather and paternal grandmother; Heart disease in his father; Stomach cancer in his paternal grandfather.   ROS:   Please see the history of present illness.    Review of Systems  Constitution: Negative.  HENT: Negative.   Cardiovascular: Positive for chest pain and dyspnea on exertion.  Respiratory: Negative.   Endocrine: Negative.   Hematologic/Lymphatic: Negative.   Musculoskeletal: Negative.   Gastrointestinal: Negative.   Genitourinary: Negative.   Neurological: Positive for dizziness.  Psychiatric/Behavioral: The patient is nervous/anxious.    All other systems reviewed and are negative.   PHYSICAL EXAM:   VS:  BP 122/70   Pulse 70   Ht 5\' 8"  (1.727 m)   Wt 158 lb (71.7 kg)   SpO2 95%   BMI 24.02 kg/m   Physical Exam  GEN: Thin, in no acute distress  HEENT: normal  Neck: no JVD, carotid bruits, or masses Cardiac:RRR; no murmurs, rubs, or gallops  Respiratory: Decreased breath sounds throughout GI: soft, nontender, nondistended, + BS Ext: without cyanosis, clubbing, or edema, Good distal pulses bilaterally MS: no deformity or atrophy  Skin: warm and dry, no rash Neuro:  Alert and Oriented x 3 Psych: euthymic mood, full affect  Wt Readings from Last 3 Encounters:  09/15/18 158 lb (71.7 kg)  09/01/18 158 lb 12.8 oz (72 kg)  07/20/18 165 lb (74.8  kg)      Studies/Labs Reviewed:   EKG:  EKG is  ordered today.  The ekg ordered today demonstrates normal sinus rhythm with nonspecific ST-T wave changes, no acute change from last EKG  Recent Labs: 01/05/2018: B Natriuretic Peptide 422.0 01/13/2018: Magnesium 1.6 01/15/2018: ALT 26 01/16/2018: BUN 25; Creatinine, Ser 0.80; Hemoglobin 9.4; Platelets 212; Potassium 3.9; Sodium 139   Lipid Panel    Component Value Date/Time   CHOL 204 (H) 03/15/2015 0558   TRIG 116 04/09/2015 0802   HDL 29 (L) 03/15/2015 0558   CHOLHDL 7.0 03/15/2015 0558   VLDL 26 03/15/2015 0558   LDLCALC 149 (H) 03/15/2015 0558    Additional studies/ records that were reviewed today include:   Cardiac cath 2017  SVG .  100% occluded  Origin lesion, 100% stenosed.  Ost RCA to Prox RCA lesion, 50% stenosed.  SVG .  This is a sequential graft with an SVG-diagonal and free RIMA that arises from the body of the SVG and goes to the ramus intermedius. The graft is patent throughout  Mid RCA lesion, 30% stenosed.  LIMA .  Patent graft  SVG .  Chronically occluded  Prox Graft lesion, 100% stenosed.  LM lesion, 50% stenosed.  Prox LAD lesion, 100% stenosed.  Prox Cx lesion, 99% stenosed.  Dist RCA lesion, 95% stenosed. Post intervention, there is a 0% residual stenosis.   1. Severe 3 vessel CAD 2. S/p CABG with continued patency of the LIMA-LAD, SVG-diagonal, and free RIMA-ramus 3. Chronic occlusion of the SVG-PDA and SVG-OM 4. Successful PCI of critical stenosis in the native RCA using a DES platform   ASA/Brilinta x 12 months as tolerated. Should be ok for hospital discharge tomorrow as long as no complications arise.    Echo 12/2019Study Conclusions   - Left ventricle: The cavity size was normal. Wall thickness was   increased in a pattern of moderate LVH. Systolic function was  normal. The estimated ejection fraction was in the range of 55%   to 60%. - Aortic valve: Mildly  calcified annulus. Mildly thickened   leaflets. Valve area (VTI): 2.26 cm^2. Valve area (Vmax): 2.26   cm^2. - Mitral valve: Mildly calcified annulus. Mildly thickened leaflets   . - Technically difficult study.   ASSESSMENT:    1. Coronary artery disease, angina presence unspecified, unspecified vessel or lesion type, unspecified whether native or transplanted heart   2. Dissection of aorta, unspecified portion of aorta ([place])   3. Essential hypertension   4. Hypercholesterolemia      PLAN:  In order of problems listed above:  CAD S/P CABG 2013, DES native RCA 2017 with patent LIMA-LAD, SVG-Dx and free RIMA-ramus, chronic occlusion of SVG-PDA and SVG-OM, chest pain with mild troponin bump 12/2017 cath cancelled secondary to retroperitoneal bleed on IV heparin now with worsening angina for cardiac cath per [name] I have reviewed the risks, indications, and alternatives to angioplasty and stenting with the patient. Risks include but are not limited to bleeding, infection, vascular injury, stroke, myocardial infection, arrhythmia, kidney injury, radiation-related injury in the case of prolonged fluoroscopy use, emergency cardiac surgery, and death. The patient understands the risks of serious complication is low (123456) and patient agrees to proceed.   Type III aortic dissection 2008 descending thoracic and AAA 4.9 cm followed by Dr. Roxan Hockey  HTN with recent BP swings-under a lot of stress at home. bp stable today.   HLD LDL 93 on Lipitor 20 mg daily 07/2018 we will increase his Lipitor to 40 mg daily LFT's in 3 months   Medication Adjustments/Labs and Tests Ordered: Current medicines are reviewed at length with the patient today.  Concerns regarding medicines are outlined above.  Medication changes, Labs and Tests ordered today are listed in the Patient Instructions below. There are no Patient Instructions on file for this visit.   Signed, Ermalinda Barrios, PA-C  09/15/2018 1:59 PM     [facility] [place], [place], [place]  [address] Phone: [phone]; Fax: [phone]

## 2018-09-15 ENCOUNTER — Other Ambulatory Visit: Payer: Self-pay

## 2018-09-15 ENCOUNTER — Encounter: Payer: Self-pay | Admitting: Physician Assistant

## 2018-09-15 ENCOUNTER — Ambulatory Visit (INDEPENDENT_AMBULATORY_CARE_PROVIDER_SITE_OTHER): Payer: Medicare HMO | Admitting: Physician Assistant

## 2018-09-15 VITALS — BP 122/70 | HR 70 | Ht 68.0 in | Wt 158.0 lb

## 2018-09-15 DIAGNOSIS — I7100 Dissection of unspecified site of aorta: Secondary | ICD-10-CM | POA: Diagnosis not present

## 2018-09-15 DIAGNOSIS — I10 Essential (primary) hypertension: Secondary | ICD-10-CM

## 2018-09-15 DIAGNOSIS — E7800 Pure hypercholesterolemia, unspecified: Secondary | ICD-10-CM | POA: Diagnosis not present

## 2018-09-15 DIAGNOSIS — I2510 Atherosclerotic heart disease of native coronary artery without angina pectoris: Secondary | ICD-10-CM | POA: Diagnosis not present

## 2018-09-15 MED ORDER — NITROGLYCERIN 0.4 MG SL SUBL: 0.4000 mg | SUBLINGUAL_TABLET | SUBLINGUAL | 3 refills | Status: AC | PRN

## 2018-09-15 MED ORDER — ATORVASTATIN CALCIUM 40 MG PO TABS: 40.0000 mg | ORAL_TABLET | Freq: Every day | ORAL | 3 refills | Status: AC

## 2018-09-15 NOTE — Patient Instructions (Addendum)
Medication Instructions:  Your physician has recommended you make the following change in your medication:  INCREASE ATORVASTATIN (LIPITOR) TO 40 MG DAILY  If you need a refill on your cardiac medications before your next appointment, please call your pharmacy.   Lab work: TODAY:CBC, BMP  Your physician recommends that you return for a FASTING lipid profile AND liver function panel on 12/09/18 at 8:00 AM   If you have labs (blood work) drawn today and your tests are completely normal, you will receive your results only by: Marland Kitchen MyChart Message (if you have MyChart) OR . A paper copy in the mail If you have any lab test that is abnormal or we need to change your treatment, we will call you to review the results.  Testing/Procedures: Your physician has requested that you have a cardiac catheterization. Cardiac catheterization is used to diagnose and/or treat various heart conditions. Doctors may recommend this procedure for a number of different reasons. The most common reason is to evaluate chest pain. Chest pain can be a symptom of coronary artery disease (CAD), and cardiac catheterization can show whether plaque is narrowing or blocking your heart's arteries. This procedure is also used to evaluate the valves, as well as measure the blood flow and oxygen levels in different parts of your heart. For further information please visit [email or web address]. Please follow instruction sheet, as given.   Follow-Up: . Follow up with Jim Drown, NP on 10/13/18 at 9:00 AM  Any Other Special Instructions Will Be Listed Below (If Applicable).      [facility] OFFICE [place], [place] [address] Dept: [phone] Loc: [place]  09/15/2018  You are scheduled for a Cardiac Catheterization on Monday, August 31 with Dr. Larae [name].  1. Please arrive at the [facility] (Main  Entrance A) at [facility]: [address] [place], [place] [address] at 7:00 AM (This time is two hours before your procedure to ensure your preparation). Free valet parking service is available.   Special note: Every effort is made to have your procedure done on time. Please understand that emergencies sometimes delay scheduled procedures.  2. Diet: Do not eat solid foods after midnight.  The patient may have clear liquids until 5am upon the day of the procedure.  3. Labs: You will need to have blood drawn on . You do not need to be fasting.  Your Pre-procedure COVID-19 Testing will be done on  at [facility] at [id number], [place], [place] [address]. Once you arrive at the testing site, stay in the right hand lane, go under the building overhang not the tent. If you are tested under the tent your results may not be back before your procedure. Please be on time for your appointment. APPT 8/27/20at 08:10 am. Please arrive by 7:55 am  After your swab you will be given a mask to wear and instructed to go home and quarantine/no visitors until after your procedure. If you test positive you will be notified and your procedure will be cancelled.     4. Medication instructions in preparation for your procedure:   Contrast Allergy: No  On the morning of your procedure, take your Aspirin 81 MG and any regular morning medicines. You may use sips of water.  5. Plan for one night stay--bring personal belongings. 6. Bring a current list of your medications and current insurance  cards. 7. You MUST have a responsible person to drive you home. 8. Someone MUST be with you the first 24 hours after you arrive home or your discharge will be delayed. 9. Please wear clothes that are easy to get on and off and wear slip-on shoes.  Thank you for allowing [place] to care for you!   -- [facility]

## 2018-09-16 ENCOUNTER — Telehealth: Payer: Self-pay | Admitting: Cardiology

## 2018-09-16 LAB — CBC
Hematocrit: 44.7 % (ref 37.5–51.0)
Hemoglobin: 15.6 g/dL (ref 13.0–17.7)
MCH: 33.3 pg — ABNORMAL HIGH (ref 26.6–33.0)
MCHC: 34.9 g/dL (ref 31.5–35.7)
MCV: 96 fL (ref 79–97)
Platelets: 175 10*3/uL (ref 150–450)
RBC: 4.68 x10E6/uL (ref 4.14–5.80)
RDW: 12.4 % (ref 11.6–15.4)
WBC: 8.4 10*3/uL (ref 3.4–10.8)

## 2018-09-16 LAB — BASIC METABOLIC PANEL
BUN/Creatinine Ratio: 14 (ref 10–24)
BUN: 14 mg/dL (ref 8–27)
CO2: 24 mmol/L (ref 20–29)
Calcium: 9.5 mg/dL (ref 8.6–10.2)
Chloride: 102 mmol/L (ref 96–106)
Creatinine, Ser: 0.99 mg/dL (ref 0.76–1.27)
GFR calc Af Amer: 91 mL/min/{1.73_m2} (ref >59)
GFR calc non Af Amer: 79 mL/min/{1.73_m2} (ref >59)
Glucose: 86 mg/dL (ref 65–99)
Potassium: 5.1 mmol/L (ref 3.5–5.2)
Sodium: 142 mmol/L (ref 134–144)

## 2018-09-16 NOTE — Telephone Encounter (Signed)
° ° °  Patient calling to reschedule pre procedure covid19 testing  Please call

## 2018-09-16 NOTE — Telephone Encounter (Signed)
Called and spoke to patient. Patient is requesting to have COVID test closer to home. Patient agrees to have test done in [place] at the [place] at 8:55 AM on 8/27.

## 2018-09-17 ENCOUNTER — Other Ambulatory Visit (HOSPITAL_COMMUNITY)
Admission: RE | Admit: 2018-09-17 | Discharge: 2018-09-17 | Disposition: A | Discharge status: Home / Self Care | Payer: Medicare HMO | Source: Ambulatory Visit | Attending: Interventional Cardiology | Admitting: Interventional Cardiology

## 2018-09-17 ENCOUNTER — Other Ambulatory Visit: Payer: Self-pay

## 2018-09-17 ENCOUNTER — Telehealth: Payer: Self-pay | Admitting: *Deleted

## 2018-09-17 ENCOUNTER — Other Ambulatory Visit (HOSPITAL_COMMUNITY): Payer: Medicare HMO

## 2018-09-17 DIAGNOSIS — Z01812 Encounter for preprocedural laboratory examination: Secondary | ICD-10-CM | POA: Insufficient documentation

## 2018-09-17 DIAGNOSIS — Z20828 Contact with and (suspected) exposure to other viral communicable diseases: Secondary | ICD-10-CM | POA: Diagnosis not present

## 2018-09-17 LAB — SARS CORONAVIRUS 2 (TAT 6-24 HRS): SARS Coronavirus 2: NEGATIVE

## 2018-09-17 NOTE — Telephone Encounter (Addendum)
Pt contacted pre-catheterization scheduled at [facility] for: Monday September 21, 2018 9 AM Verified arrival time and place: [facility]) at: 7 AM   No solid food after midnight prior to cath, clear liquids until 5 AM day of procedure. Contrast allergy: no   AM meds can be  taken pre-cath with sip of water including: ASA 81 mg   Confirm patient has responsible person to drive home post procedure and observe 24 hours after arriving home:   Currently, due to Covid-19 pandemic, only one support person will be allowed with patient. Must be the same support person for that patient's entire stay, will be screened and required to wear a mask. They will be asked to wait in the waiting room for the duration of the patient's stay.  Patients are required to wear a mask when they enter the hospital.      COVID-19 Pre-Screening Questions:  . In the past 7 to 10 days have you had a cough,  shortness of breath, headache, congestion, fever (100 or greater) body aches, chills, sore throat, or sudden loss of taste or sense of smell? . Have you been around anyone with known Covid 19? Marland Kitchen Have you been around anyone who is awaiting Covid 19 test results in the past 7 to 10 days? . Have you been around anyone who has been exposed to Covid 19, or has mentioned symptoms of Covid 19 within the past 7 to 10 days?  If you have any concerns/questions about symptoms patients report during screening (either on the phone or at [facility]). Contact the provider seeing the patient or DOD for further guidance.  If neither are available contact a member of the leadership team.

## 2018-09-17 NOTE — Telephone Encounter (Signed)
I have made multiple attempts to contact patient by telephone, call either rings and disconnects or just disconnects without ringing.

## 2018-09-18 ENCOUNTER — Telehealth: Payer: Self-pay | Admitting: Cardiology

## 2018-09-18 NOTE — Telephone Encounter (Signed)
°  Patient called in regards to his heart cath scheduled for Monday. He wants to know if his wife would need to stay on site during the procedure, or if she can just drop him off and come back. They live about an hour from the hospital and just want to make arrangements far enough in advance

## 2018-09-18 NOTE — Telephone Encounter (Signed)
Returned call to pt.  He has been made aware that his wife could go with him or she could drop him off and go back to pick him, but I made sure pt was aware that he wouldn't be allowed to drive home, that he would have to have someone pick him up.  Pt thanked me for the call back.

## 2018-09-21 ENCOUNTER — Ambulatory Visit (HOSPITAL_COMMUNITY)
Admission: RE | Admit: 2018-09-21 | Discharge: 2018-09-21 | Disposition: A | Discharge status: Home / Self Care | Payer: Medicare HMO | Attending: Interventional Cardiology | Admitting: Interventional Cardiology

## 2018-09-21 ENCOUNTER — Other Ambulatory Visit: Payer: Self-pay

## 2018-09-21 ENCOUNTER — Encounter (HOSPITAL_COMMUNITY)
Admission: RE | Disposition: A | Discharge status: Home / Self Care | Payer: Self-pay | Source: Home / Self Care | Attending: Interventional Cardiology

## 2018-09-21 DIAGNOSIS — Z79899 Other long term (current) drug therapy: Secondary | ICD-10-CM | POA: Diagnosis not present

## 2018-09-21 DIAGNOSIS — J449 Chronic obstructive pulmonary disease, unspecified: Secondary | ICD-10-CM | POA: Insufficient documentation

## 2018-09-21 DIAGNOSIS — K449 Diaphragmatic hernia without obstruction or gangrene: Secondary | ICD-10-CM | POA: Diagnosis not present

## 2018-09-21 DIAGNOSIS — F419 Anxiety disorder, unspecified: Secondary | ICD-10-CM | POA: Insufficient documentation

## 2018-09-21 DIAGNOSIS — Z951 Presence of aortocoronary bypass graft: Secondary | ICD-10-CM

## 2018-09-21 DIAGNOSIS — R1312 Dysphagia, oropharyngeal phase: Secondary | ICD-10-CM | POA: Diagnosis not present

## 2018-09-21 DIAGNOSIS — K219 Gastro-esophageal reflux disease without esophagitis: Secondary | ICD-10-CM | POA: Diagnosis not present

## 2018-09-21 DIAGNOSIS — I2510 Atherosclerotic heart disease of native coronary artery without angina pectoris: Secondary | ICD-10-CM

## 2018-09-21 DIAGNOSIS — E7800 Pure hypercholesterolemia, unspecified: Secondary | ICD-10-CM | POA: Diagnosis not present

## 2018-09-21 DIAGNOSIS — I10 Essential (primary) hypertension: Secondary | ICD-10-CM | POA: Insufficient documentation

## 2018-09-21 DIAGNOSIS — Z87891 Personal history of nicotine dependence: Secondary | ICD-10-CM | POA: Diagnosis not present

## 2018-09-21 DIAGNOSIS — I25118 Atherosclerotic heart disease of native coronary artery with other forms of angina pectoris: Secondary | ICD-10-CM

## 2018-09-21 DIAGNOSIS — I714 Abdominal aortic aneurysm, without rupture: Secondary | ICD-10-CM | POA: Diagnosis not present

## 2018-09-21 DIAGNOSIS — I25810 Atherosclerosis of coronary artery bypass graft(s) without angina pectoris: Secondary | ICD-10-CM

## 2018-09-21 DIAGNOSIS — Z7982 Long term (current) use of aspirin: Secondary | ICD-10-CM | POA: Diagnosis not present

## 2018-09-21 DIAGNOSIS — Z79890 Hormone replacement therapy (postmenopausal): Secondary | ICD-10-CM | POA: Insufficient documentation

## 2018-09-21 DIAGNOSIS — Z95820 Peripheral vascular angioplasty status with implants and grafts: Secondary | ICD-10-CM

## 2018-09-21 HISTORY — PX: CORONARY STENT INTERVENTION: CATH118234

## 2018-09-21 HISTORY — PX: LEFT HEART CATH AND CORS/GRAFTS ANGIOGRAPHY: CATH118250

## 2018-09-21 LAB — POCT ACTIVATED CLOTTING TIME
Activated Clotting Time: 241 seconds
Activated Clotting Time: 362 seconds
Activated Clotting Time: 505 seconds

## 2018-09-21 SURGERY — LEFT HEART CATH AND CORS/GRAFTS ANGIOGRAPHY
Anesthesia: LOCAL

## 2018-09-21 MED ORDER — HEPARIN SODIUM (PORCINE) 1000 UNIT/ML IJ SOLN
INTRAMUSCULAR | Status: DC | PRN
  Administered 2018-09-21: 3000 [IU] via INTRAVENOUS
  Administered 2018-09-21: 5000 [IU] via INTRAVENOUS
  Administered 2018-09-21: 7000 [IU] via INTRAVENOUS

## 2018-09-21 MED ORDER — AMLODIPINE BESYLATE 5 MG PO TABS: 5.0000 mg | ORAL_TABLET | Freq: Every day | ORAL | Status: DC

## 2018-09-21 MED ORDER — FAMOTIDINE IN NACL 20-0.9 MG/50ML-% IV SOLN
INTRAVENOUS | Status: AC
  Filled 2018-09-21: qty 50

## 2018-09-21 MED ORDER — SODIUM CHLORIDE 0.9% FLUSH: 3.0000 mL | Freq: Two times a day (BID) | INTRAVENOUS | Status: DC

## 2018-09-21 MED ORDER — VERAPAMIL HCL 2.5 MG/ML IV SOLN
INTRAVENOUS | Status: DC | PRN
  Administered 2018-09-21 (×3): 200 ug via INTRACORONARY

## 2018-09-21 MED ORDER — CLOPIDOGREL BISULFATE 300 MG PO TABS
ORAL_TABLET | ORAL | Status: AC
  Filled 2018-09-21: qty 2

## 2018-09-21 MED ORDER — NITROGLYCERIN 1 MG/10 ML FOR IR/CATH LAB
INTRA_ARTERIAL | Status: DC | PRN
  Administered 2018-09-21 (×2): 200 ug via INTRACORONARY

## 2018-09-21 MED ORDER — IOHEXOL 350 MG/ML SOLN
INTRAVENOUS | Status: DC | PRN
  Administered 2018-09-21: 195 mL via INTRA_ARTERIAL

## 2018-09-21 MED ORDER — ALBUTEROL SULFATE (2.5 MG/3ML) 0.083% IN NEBU: 2.5000 mg | INHALATION_SOLUTION | Freq: Four times a day (QID) | RESPIRATORY_TRACT | Status: DC | PRN

## 2018-09-21 MED ORDER — MIDAZOLAM HCL 2 MG/2ML IJ SOLN
INTRAMUSCULAR | Status: AC
  Filled 2018-09-21: qty 2

## 2018-09-21 MED ORDER — CLOPIDOGREL BISULFATE 300 MG PO TABS
ORAL_TABLET | ORAL | Status: DC | PRN
  Administered 2018-09-21: 600 mg via ORAL

## 2018-09-21 MED ORDER — HEPARIN (PORCINE) IN NACL 1000-0.9 UT/500ML-% IV SOLN
INTRAVENOUS | Status: DC | PRN
  Administered 2018-09-21 (×2): 500 mL

## 2018-09-21 MED ORDER — ASPIRIN 81 MG PO CHEW: 81.0000 mg | CHEWABLE_TABLET | ORAL | Status: DC

## 2018-09-21 MED ORDER — SODIUM CHLORIDE 0.9% FLUSH: 3.0000 mL | INTRAVENOUS | Status: DC | PRN

## 2018-09-21 MED ORDER — HEPARIN SODIUM (PORCINE) 1000 UNIT/ML IJ SOLN
INTRAMUSCULAR | Status: AC
  Filled 2018-09-21: qty 1

## 2018-09-21 MED ORDER — VERAPAMIL HCL 2.5 MG/ML IV SOLN
INTRAVENOUS | Status: AC
  Filled 2018-09-21: qty 2

## 2018-09-21 MED ORDER — LIDOCAINE HCL (PF) 1 % IJ SOLN
INTRAMUSCULAR | Status: DC | PRN
  Administered 2018-09-21: 2 mL

## 2018-09-21 MED ORDER — NITROGLYCERIN 1 MG/10 ML FOR IR/CATH LAB
INTRA_ARTERIAL | Status: AC
  Filled 2018-09-21: qty 10

## 2018-09-21 MED ORDER — ASPIRIN EC 81 MG PO TBEC: 81.0000 mg | DELAYED_RELEASE_TABLET | Freq: Every day | ORAL | 0 refills | Status: DC

## 2018-09-21 MED ORDER — SODIUM CHLORIDE 0.9 % WEIGHT BASED INFUSION
1.0000 mL/kg/h | INTRAVENOUS | Status: DC
  Administered 2018-09-21: 08:00:00 1 mL/kg/h via INTRAVENOUS

## 2018-09-21 MED ORDER — FAMOTIDINE IN NACL 20-0.9 MG/50ML-% IV SOLN
INTRAVENOUS | Status: AC | PRN
  Administered 2018-09-21: 20 mg via INTRAVENOUS

## 2018-09-21 MED ORDER — ALPRAZOLAM 0.25 MG PO TABS: 0.2500 mg | ORAL_TABLET | Freq: Two times a day (BID) | ORAL | Status: DC | PRN

## 2018-09-21 MED ORDER — SODIUM CHLORIDE 0.9 % IV SOLN: 250.0000 mL | INTRAVENOUS | Status: DC | PRN

## 2018-09-21 MED ORDER — CLOPIDOGREL BISULFATE 75 MG PO TABS: 75.0000 mg | ORAL_TABLET | Freq: Every day | ORAL | 0 refills | Status: DC

## 2018-09-21 MED ORDER — TRAMADOL HCL 50 MG PO TABS: 100.0000 mg | ORAL_TABLET | Freq: Two times a day (BID) | ORAL | Status: DC

## 2018-09-21 MED ORDER — HYDRALAZINE HCL 20 MG/ML IJ SOLN: 10.0000 mg | INTRAMUSCULAR | Status: DC | PRN

## 2018-09-21 MED ORDER — ACETAMINOPHEN 325 MG PO TABS
650.0000 mg | ORAL_TABLET | ORAL | Status: DC | PRN
  Administered 2018-09-21: 12:00:00 650 mg via ORAL
  Filled 2018-09-21: qty 2

## 2018-09-21 MED ORDER — ASPIRIN 81 MG PO CHEW: 81.0000 mg | CHEWABLE_TABLET | Freq: Every day | ORAL | Status: DC

## 2018-09-21 MED ORDER — ALUM & MAG HYDROXIDE-SIMETH 200-200-20 MG/5ML PO SUSP
ORAL | Status: DC | PRN
  Administered 2018-09-21: 15 mL via ORAL

## 2018-09-21 MED ORDER — LIDOCAINE HCL (PF) 1 % IJ SOLN
INTRAMUSCULAR | Status: AC
  Filled 2018-09-21: qty 30

## 2018-09-21 MED ORDER — ALBUTEROL SULFATE HFA 108 (90 BASE) MCG/ACT IN AERS: 2.0000 | INHALATION_SPRAY | Freq: Four times a day (QID) | RESPIRATORY_TRACT | Status: DC | PRN

## 2018-09-21 MED ORDER — METOPROLOL TARTRATE 25 MG PO TABS: 25.0000 mg | ORAL_TABLET | Freq: Three times a day (TID) | ORAL | Status: DC

## 2018-09-21 MED ORDER — CLOPIDOGREL BISULFATE 75 MG PO TABS: 75.0000 mg | ORAL_TABLET | Freq: Every day | ORAL | 12 refills | Status: DC

## 2018-09-21 MED ORDER — GABAPENTIN 300 MG PO CAPS: 300.0000 mg | ORAL_CAPSULE | Freq: Three times a day (TID) | ORAL | Status: DC

## 2018-09-21 MED ORDER — ATORVASTATIN CALCIUM 40 MG PO TABS: 40.0000 mg | ORAL_TABLET | Freq: Every day | ORAL | Status: DC

## 2018-09-21 MED ORDER — HEPARIN (PORCINE) IN NACL 1000-0.9 UT/500ML-% IV SOLN
INTRAVENOUS | Status: AC
  Filled 2018-09-21: qty 1000

## 2018-09-21 MED ORDER — VERAPAMIL HCL 2.5 MG/ML IV SOLN
INTRAVENOUS | Status: DC | PRN
  Administered 2018-09-21: 10 mL via INTRA_ARTERIAL

## 2018-09-21 MED ORDER — LABETALOL HCL 5 MG/ML IV SOLN: 10.0000 mg | INTRAVENOUS | Status: DC | PRN

## 2018-09-21 MED ORDER — FENTANYL CITRATE (PF) 100 MCG/2ML IJ SOLN
INTRAMUSCULAR | Status: AC
  Filled 2018-09-21: qty 2

## 2018-09-21 MED ORDER — ONDANSETRON HCL 4 MG/2ML IJ SOLN: 4.0000 mg | Freq: Four times a day (QID) | INTRAMUSCULAR | Status: DC | PRN

## 2018-09-21 MED ORDER — LEVOTHYROXINE SODIUM 50 MCG PO TABS: 50.0000 ug | ORAL_TABLET | Freq: Every day | ORAL | Status: DC

## 2018-09-21 MED ORDER — NITROGLYCERIN 0.4 MG SL SUBL: 0.4000 mg | SUBLINGUAL_TABLET | SUBLINGUAL | Status: DC | PRN

## 2018-09-21 MED ORDER — CLOPIDOGREL BISULFATE 75 MG PO TABS: 75.0000 mg | ORAL_TABLET | Freq: Every day | ORAL | Status: DC

## 2018-09-21 MED ORDER — ENSURE ENLIVE PO LIQD: 237.0000 mL | Freq: Two times a day (BID) | ORAL | Status: DC

## 2018-09-21 MED ORDER — FENTANYL CITRATE (PF) 100 MCG/2ML IJ SOLN
INTRAMUSCULAR | Status: DC | PRN
  Administered 2018-09-21 (×4): 25 ug via INTRAVENOUS

## 2018-09-21 MED ORDER — PANTOPRAZOLE SODIUM 40 MG PO TBEC: 40.0000 mg | DELAYED_RELEASE_TABLET | Freq: Every day | ORAL | Status: DC

## 2018-09-21 MED ORDER — ASPIRIN EC 81 MG PO TBEC: 81.0000 mg | DELAYED_RELEASE_TABLET | Freq: Every day | ORAL | 12 refills | Status: AC

## 2018-09-21 MED ORDER — MIDAZOLAM HCL 2 MG/2ML IJ SOLN
INTRAMUSCULAR | Status: DC | PRN
  Administered 2018-09-21: 2 mg via INTRAVENOUS
  Administered 2018-09-21 (×2): 1 mg via INTRAVENOUS

## 2018-09-21 MED ORDER — FLUTICASONE-UMECLIDIN-VILANT 100-62.5-25 MCG/INH IN AEPB: 1.0000 | INHALATION_SPRAY | Freq: Every day | RESPIRATORY_TRACT | Status: DC

## 2018-09-21 MED ORDER — ALUM & MAG HYDROXIDE-SIMETH 200-200-20 MG/5ML PO SUSP
ORAL | Status: AC
  Filled 2018-09-21: qty 30

## 2018-09-21 MED ORDER — SODIUM CHLORIDE 0.9 % IV SOLN: INTRAVENOUS | Status: AC

## 2018-09-21 MED ORDER — TRAZODONE HCL 100 MG PO TABS: 100.0000 mg | ORAL_TABLET | Freq: Every day | ORAL | Status: DC

## 2018-09-21 MED ORDER — SODIUM CHLORIDE 0.9 % WEIGHT BASED INFUSION
3.0000 mL/kg/h | INTRAVENOUS | Status: AC
  Administered 2018-09-21: 3 mL/kg/h via INTRAVENOUS

## 2018-09-21 MED ORDER — BUSPIRONE HCL 15 MG PO TABS: 7.5000 mg | ORAL_TABLET | Freq: Three times a day (TID) | ORAL | Status: DC

## 2018-09-21 MED FILL — ASPIRIN LOW DOSE 81 MG TBEC: 81 | 30 days supply | Qty: 30 | Fill #0

## 2018-09-21 MED FILL — CLOPIDOGREL 75 MG TABLET: 75 | 30 days supply | Qty: 30 | Fill #0

## 2018-09-21 SURGICAL SUPPLY — 24 items
BALLN SAPPHIRE 2.0X12 (BALLOONS) ×2
BALLN SAPPHIRE ~~LOC~~ 3.0X10 (BALLOONS) ×1 IMPLANT
BALLN SAPPHIRE ~~LOC~~ 3.25X8 (BALLOONS) ×1 IMPLANT
BALLOON SAPPHIRE 2.0X12 (BALLOONS) IMPLANT
CATH INFINITI 5 FR IM (CATHETERS) ×1 IMPLANT
CATH INFINITI 5FR AL1 (CATHETERS) ×1 IMPLANT
CATH INFINITI 5FR MULTPACK ANG (CATHETERS) ×1 IMPLANT
CATH LAUNCHER 6FR AL1 (CATHETERS) IMPLANT
CATHETER LAUNCHER 6FR AL1 (CATHETERS) ×2
DEVICE RAD COMP TR BAND LRG (VASCULAR PRODUCTS) ×1 IMPLANT
GLIDESHEATH SLEND SS 6F .021 (SHEATH) ×1 IMPLANT
GUIDEWIRE INQWIRE 1.5J.035X260 (WIRE) IMPLANT
INQWIRE 1.5J .035X260CM (WIRE) ×2
KIT ENCORE 26 ADVANTAGE (KITS) ×1 IMPLANT
KIT HEART LEFT (KITS) ×2 IMPLANT
KIT HEMO VALVE WATCHDOG (MISCELLANEOUS) ×1 IMPLANT
PACK CARDIAC CATHETERIZATION (CUSTOM PROCEDURE TRAY) ×2 IMPLANT
STENT SYNERGY DES 2.5X16 (Permanent Stent) ×1 IMPLANT
STENT SYNERGY DES 2.5X24 (Permanent Stent) ×1 IMPLANT
STENT SYNERGY DES 2.75X12 (Permanent Stent) ×1 IMPLANT
STENT SYNERGY DES 2.75X16 (Permanent Stent) ×1 IMPLANT
TRANSDUCER W/STOPCOCK (MISCELLANEOUS) ×2 IMPLANT
TUBING CIL FLEX 10 FLL-RA (TUBING) ×2 IMPLANT
WIRE ASAHI PROWATER 180CM (WIRE) ×1 IMPLANT

## 2018-09-21 NOTE — Discharge Instructions (Signed)
DRINK PLENTY OF FLUIDS FOR THE NEXT 2-3 DAYS.  KEEP AFFECTED ARM ELEVATED FOR THE REMAINDER OF THE DAY.  Radial Site Care  Refer to this sheet in the next few weeks. These instructions provide you with information on caring for yourself after your procedure. Your caregiver may also give you more specific instructions. Your treatment has been planned according to current medical practices, but problems sometimes occur. Call your caregiver if you have any problems or questions after your procedure.  HOME CARE INSTRUCTIONS  You may shower the day after the procedure.Remove the bandage (dressing) and gently wash the site with plain soap and water.Gently pat the site dry.  Do not apply powder or lotion to the site.  Do not submerge the affected site in water for 3 to 5 days.  Inspect the site at least twice daily.  Do not flex or bend the affected arm for 24 hours.  No lifting over 5 pounds (2.3 kg) for 5 days after your procedure.  What to expect:  Any bruising will usually fade within 1 to 2 weeks.  Blood that collects in the tissue (hematoma) may be painful to the touch. It should usually decrease in size and tenderness within 1 to 2 weeks.  SEEK IMMEDIATE MEDICAL CARE IF:  You have unusual pain at the radial site.  You have redness, warmth, swelling, or pain at the radial site.  You have drainage (other than a small amount of blood on the dressing).  You have chills.  You have a fever or persistent symptoms for more than 72 hours.  You have a fever and your symptoms suddenly get worse.  Your arm becomes pale, cool, tingly, or numb.  You have heavy bleeding from the site. Hold pressure on the site.   PLEASE DO NOT MISS ANY DOSES OF YOUR PLAVIX!!!!! Also keep a log of you blood pressures and bring back to your follow up appt. Please call the office with any questions.   Patients taking blood thinners should generally stay away from medicines like ibuprofen, Advil, Motrin, naproxen, and  Aleve due to risk of stomach bleeding. You may take  Tylenol as directed or talk to your primary doctor about alternatives.

## 2018-09-21 NOTE — Interval H&P Note (Signed)
Cath Lab Visit (complete for each Cath Lab visit)  Clinical Evaluation Leading to the Procedure:   ACS: No.  Non-ACS:    Anginal Classification: CCS III  Anti-ischemic medical therapy: Minimal Therapy (1 class of medications)  Non-Invasive Test Results: No non-invasive testing performed  Prior CABG: Previous CABG      History and Physical Interval Note:  09/21/2018 7:41 AM  [name]  has presented today for surgery, with the diagnosis of Chest Pain.  The various methods of treatment have been discussed with the patient and family. After consideration of risks, benefits and other options for treatment, the patient has consented to  Procedure(s): LEFT HEART CATH AND CORS/GRAFTS ANGIOGRAPHY (N/A) as a surgical intervention.  The patient's history has been reviewed, patient examined, no change in status, stable for surgery.  I have reviewed the patient's chart and labs.  Questions were answered to the patient's satisfaction.     Larae Grooms

## 2018-09-21 NOTE — Progress Notes (Signed)
[id number] Discussed importance of plavix with stent. Discussed NTG use, heart healthy food choices, walking as tolerated with back issues, and smoking cessation. Pt has quit since December. Congratulated pt on success. Pt stated he attended CRP 2 [place] before when he worked in [employer]. Will refer to [facility] and let them follow up. Pt voiced understanding. Graylon Good RN BSN 09/21/2018 11:59 AM

## 2018-09-21 NOTE — Discharge Summary (Addendum)
Discharge Summary/SAME DAY PCI    Patient ID: [name],  MRN: [id number], DOB/AGE: Dec 10, 1950 68 y.o.  Admit date: 09/21/2018 Discharge date: 09/21/2018  Primary Care Provider: Wenda Low Primary Cardiologist: [name]   Discharge Diagnoses    Active Problems:   CAD (coronary artery disease)   Allergies Allergies  Allergen Reactions   Nsaids Shortness Of Breath   Bee Venom Swelling    Neck swelling, passes out   Codeine Other (See Comments)    Causes GI Upset    Diagnostic Studies/Procedures    Cath: 09/21/18   LM lesion is 50% stenosed.  Prox Cx lesion is 99% stenosed. Small diffusely diseased vessel.  LIMA to LAD is patent.  Prox Graft lesion is 80% stenosed.  A drug-eluting stent was successfully placed using a STENT SYNERGY DES 2.75X16.  Post intervention, there is a 0% residual stenosis.  Prox Graft lesion in the Y graft portion to the ramus is 100% stenosed.  Dist RCA lesion is 80% stenosed.  A drug-eluting stent was successfully placed using a STENT SYNERGY DES 2.5X16.  Post intervention, there is a 0% residual stenosis.  Mid RCA lesion is 75% stenosed.  A drug-eluting stent was successfully placed using a STENT SYNERGY DES 2.5X24.  Post intervention, there is a 0% residual stenosis.  Ost RCA to Prox RCA lesion is 75% stenosed.  A drug-eluting stent was successfully placed using a STENT SYNERGY DES 2.75X12.  Post intervention, there is a 0% residual stenosis.  1st RPL lesion is 80% stenosed. This vessel is too small for intervention.  The left ventricular systolic function is normal.  LV end diastolic pressure is mildly elevated.  The left ventricular ejection fraction is 55-65% by visual estimate.  There is no aortic valve stenosis.   Continue aggressive secondary prevention including dual antiplatelet therapy.   I spoke with the patient's wife and stressed the importance of DAPT.   Diagnostic Dominance:  Right  Intervention    _____________   History of Present Illness     [name] is a 68 y.o. male with a history of coronary disease, CABG in 2009 last cardiac catheterization in 2017 DES native RCA, patent LIMA-LAD and SVG-Dx and free RIMA-ramus, occluded SVG-PDA and SVG-OM, type III aortic dissection in 2008, descending thoracic and abdominal aortic aneurysms 4.9 cm, former tobacco abuse (quit December 2019), COPD, ethanol abuse, chronic back pain, anxiety, and depression   In December 2019 he was hospitalized with chest pain and had mildly elevated troponins. Plan was for a cath butHe had a retroperitoneal bleed and cardiac catheterization was not done.   Had telemedicine visit with [name] 07/20/18 and not having angina but having marked orthostatic BP changes from being high and dropping 100 points with significant dizziness with standing.Marland Kitchen He was asked to get a new BP cuff.  He saw Dr. Roxan Hockey 09/01/18 complaining of chest tightness with little exertion. He discussed with [name] who recommendedcardiac catheterization.  Complained of chest tightness into right arm with shortness of breath after walking 40 ft. Can ease within minutes but comes back with little activity. Quick smoking last November but still gets the urge to smoke and used a nicotine patch. Under a lot of stress with 5 girls living at home youngest is 67 yr old. Given his ongoing symptoms he was sent for outpatient cardiac cath.    Hospital Course     Underwent cardiac cath noted above with patent LIMA-LAD, 80% stenosis in  the SVG-Diag with PCI/DESx1, occluded SVG-RCA with PCI/DES to the native RCA x3. Plan for DAPT with ASA/Plavix for at least one year. Seen by cardiac rehab while in short stay. No complications post cath. Radial site stable. Instructions/precautions regarding cath site care given prior to discharge.    [name] was seen by Dr. Irish Lack and determined stable for discharge home. Follow  up in the office has been arranged. Medications are listed below.   _____________  Discharge Vitals Blood pressure (!) 151/75, pulse (!) 58, temperature 98.1 F (36.7 C), temperature source Skin, resp. rate 12, height 5\' 10"  ([id number] m), weight 72.6 kg, SpO2 97 %.  Filed Weights   09/21/18 0642  Weight: 72.6 kg    Labs & Radiologic Studies    CBC No results for input(s): WBC, NEUTROABS, HGB, HCT, MCV, PLT in the last 72 hours. Basic Metabolic Panel No results for input(s): NA, K, CL, CO2, GLUCOSE, BUN, CREATININE, CALCIUM, MG, PHOS in the last 72 hours. Liver Function Tests No results for input(s): AST, ALT, ALKPHOS, BILITOT, PROT, ALBUMIN in the last 72 hours. No results for input(s): LIPASE, AMYLASE in the last 72 hours. Cardiac Enzymes No results for input(s): CKTOTAL, CKMB, CKMBINDEX, TROPONINI in the last 72 hours. BNP Invalid input(s): POCBNP D-Dimer No results for input(s): DDIMER in the last 72 hours. Hemoglobin A1C No results for input(s): HGBA1C in the last 72 hours. Fasting Lipid Panel No results for input(s): CHOL, HDL, LDLCALC, TRIG, CHOLHDL, LDLDIRECT in the last 72 hours. Thyroid Function Tests No results for input(s): TSH, T4TOTAL, T3FREE, THYROIDAB in the last 72 hours.  Invalid input(s): FREET3 _____________  Ct Angio Chest Aorta W &/or Wo Contrast  Result Date: 09/01/2018 CLINICAL DATA:  Thoracic aortic dissection, follow-up EXAM: CT ANGIOGRAPHY CHEST, ABDOMEN AND PELVIS TECHNIQUE: Multidetector CT imaging through the chest, abdomen and pelvis was performed using the standard protocol during bolus administration of intravenous contrast. Multiplanar reconstructed images and MIPs were obtained and reviewed to evaluate the vascular anatomy. CONTRAST:  21mL ISOVUE-370 IOPAMIDOL (ISOVUE-370) INJECTION 76% COMPARISON:  02/20/2018 and previous FINDINGS: CTA CHEST FINDINGS Cardiovascular: Heart size normal. No pericardial effusion. Satisfactory opacification of  pulmonary arteries noted, and there is no evidence of pulmonary emboli. Coronary calcifications. Previous CABG. Chronic dissection extending through the distal arch and descending thoracic segment. No definite enhancement of the false lumen. No significant compromise of the true lumen. Dilatation of the distal arch up to 4.6cm diameter (stable by my measurement), with tapering of the distal descending segment to 3.8 cm. No evidence of rupture. Mediastinum/Nodes: No hilar or mediastinal adenopathy. No mediastinal hemorrhage. Lungs/Pleura: No pleural effusion. No pneumothorax. Pulmonary emphysema. Musculoskeletal: Previous median sternotomy. No acute fracture or worrisome bone lesion. Review of the MIP images confirms the above findings. CTA ABDOMEN AND PELVIS FINDINGS VASCULAR Aorta: The dissection continues into the abdominal aorta with thrombosis of the false lumen, no significant compromise of the true lumen. Fusiform infrarenal aortic aneurysm 4.9 x 4.4 cm maximum transverse dimensions, stable. Eccentric mural thrombus in the aneurysmal segment. Aorta tapers to a diameter of 3.7 cm above the bifurcation. Celiac: Patent SMA: Patent Renals: Single left, with proximal stenosis over the first 1.6 cm, patent distally. Single right, patent. IMA: Origin occlusion or high-grade stenosis, reconstituted distally by visceral collaterals. Inflow: On the right, mild narrowing of the common iliac origin. The distal right common iliac is ectatic up to 1.4 cm diameter. Internal and external iliac arteries patent.w On the left, dilated common iliac artery  up to 1.6 cm diameter. External iliac patent. Visualized proximal runoff patent bilaterally. Veins: No obvious venous abnormality within the limitations of this arterial phase study. Patent bilateral renal veins. Review of the MIP images confirms the above findings. NON-VASCULAR Hepatobiliary: No focal liver abnormality is seen. No gallstones, gallbladder wall thickening, or  biliary dilatation. Pancreas: Unremarkable. No pancreatic ductal dilatation or surrounding inflammatory changes. Spleen: Normal in size without focal abnormality. Adrenals/Urinary Tract: Unremarkable adrenals. No renal mass or hydronephrosis. Urinary bladder incompletely distended. Stomach/Bowel: Stomach is nondistended. Small bowel decompressed. Normal appendix. The colon is nondilated, unremarkable. Lymphatic: No abdominal or pelvic adenopathy. Reproductive: Prostate is unremarkable. Other: No ascites. No free air. Musculoskeletal: Instrumented PLIF L4-S1, hardware resulting in some regional streak artifact. Adjacent level degenerative disc disease L3-4. no fracture or worrisome bone lesion. Review of the MIP images confirms the above findings. IMPRESSION: 1. Stable type B aortic dissection with 4.6 cm dilatation of the distal aortic arch. Recommend semi-annual imaging followup by CTA or MRA and referral to cardiothoracic surgery if not already obtained. This recommendation follows 2010 ACCF/AHA/AATS/ACR/ASA/SCA/SCAI/SIR/STS/SVM Guidelines for the Diagnosis and Management of Patients With Thoracic Aortic Disease. Circulation. 2010; 121: e266-e36 2. Stable 4.9 cm fusiform infrarenal abdominal aortic aneurysm. Recommend followup by abdomen and pelvis CTA in 6 months, and vascular surgery referral/consultation if not already obtained. This recommendation follows ACR consensus guidelines: White Paper of the ACR Incidental Findings Committee II on Vascular Findings. J Am Coll Radiol 2013; 10:789-794. 3. Dilated common iliac arteries 1.6 cm left, 1.4 cm right. 4. No acute findings. Electronically Signed   By: Lucrezia Europe M.D.   On: 09/01/2018 12:47   Ct Angio Abd/pel W/ And/or W/o  Result Date: 09/01/2018 CLINICAL DATA:  Thoracic aortic dissection, follow-up EXAM: CT ANGIOGRAPHY CHEST, ABDOMEN AND PELVIS TECHNIQUE: Multidetector CT imaging through the chest, abdomen and pelvis was performed using the standard  protocol during bolus administration of intravenous contrast. Multiplanar reconstructed images and MIPs were obtained and reviewed to evaluate the vascular anatomy. CONTRAST:  14mL ISOVUE-370 IOPAMIDOL (ISOVUE-370) INJECTION 76% COMPARISON:  02/20/2018 and previous FINDINGS: CTA CHEST FINDINGS Cardiovascular: Heart size normal. No pericardial effusion. Satisfactory opacification of pulmonary arteries noted, and there is no evidence of pulmonary emboli. Coronary calcifications. Previous CABG. Chronic dissection extending through the distal arch and descending thoracic segment. No definite enhancement of the false lumen. No significant compromise of the true lumen. Dilatation of the distal arch up to 4.6cm diameter (stable by my measurement), with tapering of the distal descending segment to 3.8 cm. No evidence of rupture. Mediastinum/Nodes: No hilar or mediastinal adenopathy. No mediastinal hemorrhage. Lungs/Pleura: No pleural effusion. No pneumothorax. Pulmonary emphysema. Musculoskeletal: Previous median sternotomy. No acute fracture or worrisome bone lesion. Review of the MIP images confirms the above findings. CTA ABDOMEN AND PELVIS FINDINGS VASCULAR Aorta: The dissection continues into the abdominal aorta with thrombosis of the false lumen, no significant compromise of the true lumen. Fusiform infrarenal aortic aneurysm 4.9 x 4.4 cm maximum transverse dimensions, stable. Eccentric mural thrombus in the aneurysmal segment. Aorta tapers to a diameter of 3.7 cm above the bifurcation. Celiac: Patent SMA: Patent Renals: Single left, with proximal stenosis over the first 1.6 cm, patent distally. Single right, patent. IMA: Origin occlusion or high-grade stenosis, reconstituted distally by visceral collaterals. Inflow: On the right, mild narrowing of the common iliac origin. The distal right common iliac is ectatic up to 1.4 cm diameter. Internal and external iliac arteries patent.w On the left, dilated common iliac  artery up to 1.6 cm diameter. External iliac patent. Visualized proximal runoff patent bilaterally. Veins: No obvious venous abnormality within the limitations of this arterial phase study. Patent bilateral renal veins. Review of the MIP images confirms the above findings. NON-VASCULAR Hepatobiliary: No focal liver abnormality is seen. No gallstones, gallbladder wall thickening, or biliary dilatation. Pancreas: Unremarkable. No pancreatic ductal dilatation or surrounding inflammatory changes. Spleen: Normal in size without focal abnormality. Adrenals/Urinary Tract: Unremarkable adrenals. No renal mass or hydronephrosis. Urinary bladder incompletely distended. Stomach/Bowel: Stomach is nondistended. Small bowel decompressed. Normal appendix. The colon is nondilated, unremarkable. Lymphatic: No abdominal or pelvic adenopathy. Reproductive: Prostate is unremarkable. Other: No ascites. No free air. Musculoskeletal: Instrumented PLIF L4-S1, hardware resulting in some regional streak artifact. Adjacent level degenerative disc disease L3-4. no fracture or worrisome bone lesion. Review of the MIP images confirms the above findings. IMPRESSION: 1. Stable type B aortic dissection with 4.6 cm dilatation of the distal aortic arch. Recommend semi-annual imaging followup by CTA or MRA and referral to cardiothoracic surgery if not already obtained. This recommendation follows 2010 ACCF/AHA/AATS/ACR/ASA/SCA/SCAI/SIR/STS/SVM Guidelines for the Diagnosis and Management of Patients With Thoracic Aortic Disease. Circulation. 2010; 121: e266-e36 2. Stable 4.9 cm fusiform infrarenal abdominal aortic aneurysm. Recommend followup by abdomen and pelvis CTA in 6 months, and vascular surgery referral/consultation if not already obtained. This recommendation follows ACR consensus guidelines: White Paper of the ACR Incidental Findings Committee II on Vascular Findings. J Am Coll Radiol 2013; 10:789-794. 3. Dilated common iliac arteries 1.6 cm  left, 1.4 cm right. 4. No acute findings. Electronically Signed   By: Lucrezia Europe M.D.   On: 09/01/2018 12:47   Disposition   Pt is being discharged home today in good condition.  Follow-up Plans & Appointments    Follow-up Information    Tommie Raymond, NP Follow up on 10/01/2018.   Specialty: Cardiology Why: at 9am for your follow up appt. Contact information: [address] [place] [address] [phone]          Discharge Instructions    Amb Referral to Cardiac Rehabilitation   Complete by: As directed    Diagnosis: Coronary Stents   After initial evaluation and assessments completed: Virtual Based Care may be provided alone or in conjunction with Phase 2 Cardiac Rehab based on patient barriers.: Yes       Discharge Medications     Medication List    TAKE these medications   ALPRAZolam 0.25 MG tablet Commonly known as: Xanax Take 1 tablet (0.25 mg total) by mouth 2 (two) times daily as needed for anxiety.   amLODipine 5 MG tablet Commonly known as: NORVASC Take 5 mg by mouth daily.   aspirin EC 81 MG tablet Take 1 tablet (81 mg total) by mouth daily.   atorvastatin 40 MG tablet Commonly known as: LIPITOR Take 1 tablet (40 mg total) by mouth daily. What changed: Another medication with the same name was removed. Continue taking this medication, and follow the directions you see here.   busPIRone 7.5 MG tablet Commonly known as: BUSPAR Take 1 tablet (7.5 mg total) by mouth 3 (three) times daily.   clopidogrel 75 MG tablet Commonly known as: Plavix Take 1 tablet (75 mg total) by mouth daily.   feeding supplement (ENSURE ENLIVE) Liqd Take 237 mLs by mouth 2 (two) times daily between meals.   gabapentin 300 MG capsule Commonly known as: NEURONTIN Take 300 mg by mouth 3 (three) times daily.   levothyroxine  50 MCG tablet Commonly known as: SYNTHROID Take 50 mcg by mouth daily.   metoprolol tartrate 25 MG tablet Commonly known as:  LOPRESSOR Take 1 tablet (25 mg total) by mouth 3 (three) times daily.   nitroGLYCERIN 0.4 MG SL tablet Commonly known as: NITROSTAT Place 1 tablet (0.4 mg total) under the tongue every 5 (five) minutes as needed for chest pain.   OXYGEN Inhale 3.5 L into the lungs daily as needed (for shortness of breath). 3-3.5lpm 24/7   pantoprazole 40 MG tablet Commonly known as: PROTONIX Take 1 tablet (40 mg total) by mouth at bedtime.   traMADol 50 MG tablet Commonly known as: ULTRAM Take 100 mg by mouth 2 (two) times daily.   traZODone 100 MG tablet Commonly known as: DESYREL Take 1 tablet (100 mg total) by mouth at bedtime as needed for sleep. What changed: when to take this   Trelegy Ellipta 100-62.5-25 MCG/INH Aepb Generic drug: Fluticasone-Umeclidin-Vilant Inhale 1 puff into the lungs daily.   Ventolin HFA 108 (90 Base) MCG/ACT inhaler Generic drug: albuterol Inhale 2 puffs into the lungs every 6 (six) hours as needed for wheezing or shortness of breath.   albuterol (2.5 MG/3ML) 0.083% nebulizer solution Commonly known as: PROVENTIL Take 3 mLs (2.5 mg total) by nebulization every 6 (six) hours as needed for wheezing or shortness of breath.       Outstanding Labs/Studies   N/a   Duration of Discharge Encounter   Greater than 30 minutes including physician time.  Signed, Reino Bellis NP-C 09/21/2018, 2:06 PM   I have examined the patient and reviewed assessment and plan and discussed with patient.  Agree with above as stated.    S/p multivessel PCI.  DOne early enough that we can watch him and send home later today.  No issues with the wrist.  Stressed importance of DAPT to his wife.    F/u with [name].  Continue aggressive secondary prevention.  Larae Grooms

## 2018-09-21 NOTE — Interval H&P Note (Signed)
History and Physical Interval Note:  09/21/2018 7:45 AM  [name]  has presented today for surgery, with the diagnosis of Chest Pain.  The various methods of treatment have been discussed with the patient and family. After consideration of risks, benefits and other options for treatment, the patient has consented to  Procedure(s): LEFT HEART CATH AND CORS/GRAFTS ANGIOGRAPHY (N/A) as a surgical intervention.  The patient's history has been reviewed, patient examined, no change in status, stable for surgery.  I have reviewed the patient's chart and labs.  Questions were answered to the patient's satisfaction.     Larae Grooms

## 2018-09-22 ENCOUNTER — Encounter (HOSPITAL_COMMUNITY): Payer: Self-pay | Admitting: Interventional Cardiology

## 2018-09-22 NOTE — Progress Notes (Signed)
Call could not be completed as dialed x 3

## 2018-09-27 NOTE — Progress Notes (Deleted)
Cardiology Office Note   Date:  09/27/2018   ID:  [name], DOB [birth year], MRN [id number]  PCP:  Wenda Low, MD  Cardiologist: [name], MD   No chief complaint on file.     History of Present Illness: [name] is a 68 y.o. male with a history of coronary disease, CABG in 2009last cardiac catheterization in 2017DES native RCA, patent LIMA-LAD and SVG-Dx and free RIMA-ramus, occluded SVG-PDA and SVG-OM,type III aortic dissection in 2008, descending thoracic and abdominal aortic aneurysms 4.9 cm, former tobacco abuse (quit December 2019), COPD, ethanol abuse, chronic back pain, anxiety, and depression  In December 2019he was hospitalized with chest pain and had mildly elevated troponins. Plan was for a cath buthe had a retroperitoneal bleed and cardiac catheterization was not completed.  Had telemedicine visit with [name] 07/20/18 and was not having angina but having marked orthostatic BP changes with standing. He was asked to get a new BP cuff.  He saw Dr. Roxan Hockey 09/01/18 complaining of chest tightness with little exertion in which case was dicussed with with [name] who recommendedcardiac catheterization.  Chest tightness was noted to radiate into right arm with shortness of breath after walking 40 ft. It would ease within minutes but comes back with little activity.   Cardiac cath performed 09/21/2018 which noted patent LIMA-LAD, 80% stenosis in the SVG-Diag with PCI/DESx1, occluded SVG-RCA with PCI/DES to the native RCA x3. Plan for DAPT with ASA/Plavix for at least one year.   Today he presents for follow up and is doing well   1. CAD s/p CABG 2013>DES to native RCA 2017, PCI to SVG-Diag, PCI to native RCAx3 08/2018: -Continue DAPT with ASA, Plavix -Atorvastatin 40, metoprolol 25 -Denies recurrent symptoms    2. Type III aortic dissection 2008: -Descending thoracic and AAA 4.9cm followed by Dr. Roxan Hockey   3. HTN: -Stable -Continue  amlodipine, metoprolol 25,   4.HLD: -LDL, 93 07/2018 -Continue atorvastatin 40   Past Medical History:  Diagnosis Date  . Anemia   . Anxiety   . Aortic dissection (HCC)    TYPE 3  . CAD (coronary artery disease)    multiple, most recently Jan-March 2017  . Cardiac tamponade   . Chronic bronchitis ([place])   . Chronic fatigue   . Chronic pain   . Constipation   . COPD (chronic obstructive pulmonary disease) ([place])   . Depression   . Dysphagia, oropharyngeal phase   . ETOH abuse   . GERD (gastroesophageal reflux disease)   . Hiatal hernia   . HTN (hypertension)   . Hypercholesterolemia   . Metabolic encephalopathy   . Opioid abuse ([place])   . Peptic ulcer disease 08/2010   EGD   . PNA (pneumonia)   . Schatzki's ring     Past Surgical History:  Procedure Laterality Date  . BACK SURGERY    . CABG X 6  07/31/2007   [facility]  . CARDIAC CATHETERIZATION    . CARDIAC CATHETERIZATION N/A 03/16/2015   Procedure: Coronary/Graft Angiography;  Surgeon: Sherren Mocha, MD;  Location: [facility];  Service: Cardiovascular;  Laterality: N/A;  . CARDIAC CATHETERIZATION N/A 03/16/2015   Procedure: Coronary Stent Intervention;  Surgeon: Sherren Mocha, MD;  Location: [facility];  Service: Cardiovascular;  Laterality: N/A;  . CORONARY STENT INTERVENTION N/A 09/21/2018   Procedure: CORONARY STENT INTERVENTION;  Surgeon: Jettie Booze, MD;  Location: [facility];  Service: Cardiovascular;  Laterality:  N/A;  . KNEE SURGERY Right    acl  . LEFT HEART CATH AND CORS/GRAFTS ANGIOGRAPHY N/A 09/21/2018   Procedure: LEFT HEART CATH AND CORS/GRAFTS ANGIOGRAPHY;  Surgeon: Jettie Booze, MD;  Location: [facility];  Service: Cardiovascular;  Laterality: N/A;  . OLECRANON BURSECTOMY Left 08/27/2012   Procedure: EXCISION LEFT OLECRANON BURSA;  Surgeon: Sanjuana Kava, MD;  Location: [facility];  Service: Orthopedics;  Laterality: Left;  . OLECRANON BURSECTOMY Left  05/25/2013   Procedure:  Excision sinus tract and tissue left elbow ;  Surgeon: Sanjuana Kava, MD;  Location: [facility];  Service: Orthopedics;  Laterality: Left;  . ORIF SCAPULAR FRACTURE Right   . SHOULDER SURGERY Left   . STERNAL WIRE REMOVEAL  12/31/2007   [facility]     Current Outpatient Medications  Medication Sig Dispense Refill  . albuterol (PROVENTIL) (2.5 MG/3ML) 0.083% nebulizer solution Take 3 mLs (2.5 mg total) by nebulization every 6 (six) hours as needed for wheezing or shortness of breath.    Marland Kitchen albuterol (VENTOLIN HFA) 108 (90 Base) MCG/ACT inhaler Inhale 2 puffs into the lungs every 6 (six) hours as needed for wheezing or shortness of breath.    . ALPRAZolam (XANAX) 0.25 MG tablet Take 1 tablet (0.25 mg total) by mouth 2 (two) times daily as needed for anxiety. 20 tablet 0  . amLODipine (NORVASC) 5 MG tablet Take 5 mg by mouth daily.    Marland Kitchen aspirin EC 81 MG tablet Take 1 tablet (81 mg total) by mouth daily. 30 tablet 12  . atorvastatin (LIPITOR) 40 MG tablet Take 1 tablet (40 mg total) by mouth daily. 90 tablet 3  . busPIRone (BUSPAR) 7.5 MG tablet Take 1 tablet (7.5 mg total) by mouth 3 (three) times daily. 45 tablet 0  . clopidogrel (PLAVIX) 75 MG tablet Take 1 tablet (75 mg total) by mouth daily. 30 tablet 12  . feeding supplement, ENSURE ENLIVE, (ENSURE ENLIVE) LIQD Take 237 mLs by mouth 2 (two) times daily between meals.    . Fluticasone-Umeclidin-Vilant (TRELEGY ELLIPTA) 100-62.5-25 MCG/INH AEPB Inhale 1 puff into the lungs daily.    Marland Kitchen gabapentin (NEURONTIN) 300 MG capsule Take 300 mg by mouth 3 (three) times daily.    Marland Kitchen levothyroxine (SYNTHROID, LEVOTHROID) 50 MCG tablet Take 50 mcg by mouth daily.    . metoprolol tartrate (LOPRESSOR) 25 MG tablet Take 1 tablet (25 mg total) by mouth 3 (three) times daily. 270 tablet 3  . nitroGLYCERIN (NITROSTAT) 0.4 MG SL tablet Place 1 tablet (0.4 mg total) under the tongue every 5 (five) minutes as needed for chest pain. 25 tablet 3  .  OXYGEN Inhale 3.5 L into the lungs daily as needed (for shortness of breath). 3-3.5lpm 24/7     . pantoprazole (PROTONIX) 40 MG tablet Take 1 tablet (40 mg total) by mouth at bedtime. 30 tablet 0  . traMADol (ULTRAM) 50 MG tablet Take 100 mg by mouth 2 (two) times daily.     . traZODone (DESYREL) 100 MG tablet Take 1 tablet (100 mg total) by mouth at bedtime as needed for sleep. (Patient taking differently: Take 100 mg by mouth at bedtime. ) 30 tablet 0   No current facility-administered medications for this visit.     Allergies:   Nsaids, Bee venom, and Codeine    Social History:  The patient  reports that he quit smoking about 8 months ago. His smoking use included cigarettes. He has a 20.00 pack-year smoking history. He quit smokeless  tobacco use about 3 years ago. He reports that he does not drink alcohol or use drugs.   Family History:  The patient's ***family history includes Asthma in his sister; Emphysema in his paternal grandfather and paternal grandmother; Heart disease in his father; Stomach cancer in his paternal grandfather.    ROS:  Please see the history of present illness.   Otherwise, review of systems are positive for {NONE DEFAULTED:18576::"none"}.   All other systems are reviewed and negative.    PHYSICAL EXAM: VS:  There were no vitals taken for this visit. , BMI There is no height or weight on file to calculate BMI. GEN: Well nourished, well developed, in no acute distress HEENT: normal Neck: no JVD, carotid bruits, or masses Cardiac: ***RRR; no murmurs, rubs, or gallops,no edema  Respiratory:  clear to auscultation bilaterally, normal work of breathing GI: soft, nontender, nondistended, + BS MS: no deformity or atrophy Skin: warm and dry, no rash Neuro:  Strength and sensation are intact Psych: euthymic mood, full affect   EKG:  EKG {ACTION; IS/IS [id number] ordered today. The ekg ordered today demonstrates ***   Recent Labs: 01/05/2018: B Natriuretic  Peptide 422.0 01/13/2018: Magnesium 1.6 01/15/2018: ALT 26 09/15/2018: BUN 14; Creatinine, Ser 0.99; Hemoglobin 15.6; Platelets 175; Potassium 5.1; Sodium 142    Lipid Panel    Component Value Date/Time   CHOL 204 (H) 03/15/2015 0558   TRIG 116 04/09/2015 0802   HDL 29 (L) 03/15/2015 0558   CHOLHDL 7.0 03/15/2015 0558   VLDL 26 03/15/2015 0558   LDLCALC 149 (H) 03/15/2015 0558      Wt Readings from Last 3 Encounters:  09/21/18 160 lb (72.6 kg)  09/15/18 158 lb (71.7 kg)  09/01/18 158 lb 12.8 oz (72 kg)      Other studies Reviewed: Additional studies/ records that were reviewed today include:   Cath: 09/21/18   LM lesion is 50% stenosed.  Prox Cx lesion is 99% stenosed. Small diffusely diseased vessel.  LIMA to LAD is patent.  Prox Graft lesion is 80% stenosed.  A drug-eluting stent was successfully placed using a STENT SYNERGY DES 2.75X16.  Post intervention, there is a 0% residual stenosis.  Prox Graft lesion in the Y graft portion to the ramus is 100% stenosed.  Dist RCA lesion is 80% stenosed.  A drug-eluting stent was successfully placed using a STENT SYNERGY DES 2.5X16.  Post intervention, there is a 0% residual stenosis.  Mid RCA lesion is 75% stenosed.  A drug-eluting stent was successfully placed using a STENT SYNERGY DES 2.5X24.  Post intervention, there is a 0% residual stenosis.  Ost RCA to Prox RCA lesion is 75% stenosed.  A drug-eluting stent was successfully placed using a STENT SYNERGY DES 2.75X12.  Post intervention, there is a 0% residual stenosis.  1st RPL lesion is 80% stenosed. This vessel is too small for intervention.  The left ventricular systolic function is normal.  LV end diastolic pressure is mildly elevated.  The left ventricular ejection fraction is 55-65% by visual estimate.  There is no aortic valve stenosis.  Continue aggressive secondary prevention including dual antiplatelet therapy.   I spoke with the  patient's wife and stressed the importance of DAPT.   Diagnostic Dominance: Right  Intervention      ASSESSMENT AND PLAN:  1.  ***   Current medicines are reviewed at length with the patient today.  The patient {ACTIONS; HAS/DOES NOT HAVE:19233} concerns regarding medicines.  The following changes have been  made:  {PLAN; NO CHANGE:13088:s}  Labs/ tests ordered today include: *** No orders of the defined types were placed in this encounter.    Disposition:   FU with *** in {gen number [id number] {Days to years:10300}  Signed, [name], NP  09/27/2018 9:17 AM    [facility] [place], [place], [place]  [address] Phone: [phone]; Fax: [phone]

## 2018-10-01 ENCOUNTER — Ambulatory Visit: Payer: Medicare HMO | Admitting: Cardiology

## 2018-10-04 NOTE — Progress Notes (Deleted)
Cardiology Office Note   Date:  10/04/2018   ID:  [name], DOB 05/14/1950, MRN [id number]  PCP:  Wenda Low, MD  Cardiologist: [name], MD   No chief complaint on file.    History of Present Illness: [name] is a 68 y.o. male with a history of coronary disease, CABG in 2009last cardiac catheterization in 2017DES native RCA, patent LIMA-LAD and SVG-Dx and free RIMA-ramus, occluded SVG-PDA and SVG-OM,type III aortic dissection in 2008, descending thoracic and abdominal aortic aneurysms 4.9 cm, former tobacco abuse (quit December 2019), COPD, ethanol abuse, chronic back pain, anxiety, and depression  In December 2019he was hospitalized with chest pain and had mildly elevated troponins. Plan was for a cath buthe had a retroperitoneal bleed and cardiac catheterization was deferred  He had a telemedicine visit with [name] 07/20/18 and was not having angina but with marked orthostatic BP changes from being high and dropping 100 points with significant dizziness with standing. He was asked to get a new BP cuff.  He saw Dr. Roxan Hockey 09/01/18 complaining of chest tightness with little exertion. He discussed with [name] who recommendedcardiac catheterization which was performed 09/21/2018 tat showed patent LIMA-LAD, 80% stenosis in the SVG-Diag with PCI/DESx1, occluded SVG-RCA with PCI/DES to the native RCA x3. Plan for DAPT with ASA/Plavix for at least one year. Seen by cardiac rehab while in short stay. No complications post cath. Radial site stable. Instructions/precautions regarding cath site care given prior to discharge.   Past Medical History:  Diagnosis Date  . Anemia   . Anxiety   . Aortic dissection (HCC)    TYPE 3  . CAD (coronary artery disease)    multiple, most recently Jan-March 2017  . Cardiac tamponade   . Chronic bronchitis ([place])   . Chronic fatigue   . Chronic pain   . Constipation   . COPD (chronic obstructive pulmonary disease) ([place])    . Depression   . Dysphagia, oropharyngeal phase   . ETOH abuse   . GERD (gastroesophageal reflux disease)   . Hiatal hernia   . HTN (hypertension)   . Hypercholesterolemia   . Metabolic encephalopathy   . Opioid abuse ([place])   . Peptic ulcer disease 08/2010   EGD   . PNA (pneumonia)   . Schatzki's ring     Past Surgical History:  Procedure Laterality Date  . BACK SURGERY    . CABG X 6  07/31/2007   [facility]  . CARDIAC CATHETERIZATION    . CARDIAC CATHETERIZATION N/A 03/16/2015   Procedure: Coronary/Graft Angiography;  Surgeon: Sherren Mocha, MD;  Location: [facility];  Service: Cardiovascular;  Laterality: N/A;  . CARDIAC CATHETERIZATION N/A 03/16/2015   Procedure: Coronary Stent Intervention;  Surgeon: Sherren Mocha, MD;  Location: [facility];  Service: Cardiovascular;  Laterality: N/A;  . CORONARY STENT INTERVENTION N/A 09/21/2018   Procedure: CORONARY STENT INTERVENTION;  Surgeon: Jettie Booze, MD;  Location: [facility];  Service: Cardiovascular;  Laterality: N/A;  . KNEE SURGERY Right    acl  . LEFT HEART CATH AND CORS/GRAFTS ANGIOGRAPHY N/A 09/21/2018   Procedure: LEFT HEART CATH AND CORS/GRAFTS ANGIOGRAPHY;  Surgeon: Jettie Booze, MD;  Location: [facility];  Service: Cardiovascular;  Laterality: N/A;  . OLECRANON BURSECTOMY Left 08/27/2012   Procedure: EXCISION LEFT OLECRANON BURSA;  Surgeon: Sanjuana Kava, MD;  Location: [facility];  Service: Orthopedics;  Laterality: Left;  . OLECRANON BURSECTOMY Left  05/25/2013   Procedure:  Excision sinus tract and tissue left elbow ;  Surgeon: Sanjuana Kava, MD;  Location: [facility];  Service: Orthopedics;  Laterality: Left;  . ORIF SCAPULAR FRACTURE Right   . SHOULDER SURGERY Left   . STERNAL WIRE REMOVEAL  12/31/2007   [facility]     Current Outpatient Medications  Medication Sig Dispense Refill  . albuterol (PROVENTIL) (2.5 MG/3ML) 0.083% nebulizer solution Take 3 mLs (2.5 mg total) by  nebulization every 6 (six) hours as needed for wheezing or shortness of breath.    Marland Kitchen albuterol (VENTOLIN HFA) 108 (90 Base) MCG/ACT inhaler Inhale 2 puffs into the lungs every 6 (six) hours as needed for wheezing or shortness of breath.    . ALPRAZolam (XANAX) 0.25 MG tablet Take 1 tablet (0.25 mg total) by mouth 2 (two) times daily as needed for anxiety. 20 tablet 0  . amLODipine (NORVASC) 5 MG tablet Take 5 mg by mouth daily.    Marland Kitchen aspirin EC 81 MG tablet Take 1 tablet (81 mg total) by mouth daily. 30 tablet 12  . atorvastatin (LIPITOR) 40 MG tablet Take 1 tablet (40 mg total) by mouth daily. 90 tablet 3  . busPIRone (BUSPAR) 7.5 MG tablet Take 1 tablet (7.5 mg total) by mouth 3 (three) times daily. 45 tablet 0  . clopidogrel (PLAVIX) 75 MG tablet Take 1 tablet (75 mg total) by mouth daily. 30 tablet 12  . feeding supplement, ENSURE ENLIVE, (ENSURE ENLIVE) LIQD Take 237 mLs by mouth 2 (two) times daily between meals.    . Fluticasone-Umeclidin-Vilant (TRELEGY ELLIPTA) 100-62.5-25 MCG/INH AEPB Inhale 1 puff into the lungs daily.    Marland Kitchen gabapentin (NEURONTIN) 300 MG capsule Take 300 mg by mouth 3 (three) times daily.    Marland Kitchen levothyroxine (SYNTHROID, LEVOTHROID) 50 MCG tablet Take 50 mcg by mouth daily.    . metoprolol tartrate (LOPRESSOR) 25 MG tablet Take 1 tablet (25 mg total) by mouth 3 (three) times daily. 270 tablet 3  . nitroGLYCERIN (NITROSTAT) 0.4 MG SL tablet Place 1 tablet (0.4 mg total) under the tongue every 5 (five) minutes as needed for chest pain. 25 tablet 3  . OXYGEN Inhale 3.5 L into the lungs daily as needed (for shortness of breath). 3-3.5lpm 24/7     . pantoprazole (PROTONIX) 40 MG tablet Take 1 tablet (40 mg total) by mouth at bedtime. 30 tablet 0  . traMADol (ULTRAM) 50 MG tablet Take 100 mg by mouth 2 (two) times daily.     . traZODone (DESYREL) 100 MG tablet Take 1 tablet (100 mg total) by mouth at bedtime as needed for sleep. (Patient taking differently: Take 100 mg by mouth at  bedtime. ) 30 tablet 0   No current facility-administered medications for this visit.     Allergies:   Nsaids, Bee venom, and Codeine    Social History:  The patient  reports that he quit smoking about 9 months ago. His smoking use included cigarettes. He has a 20.00 pack-year smoking history. He quit smokeless tobacco use about 3 years ago. He reports that he does not drink alcohol or use drugs.   Family History:  The patient's ***family history includes Asthma in his sister; Emphysema in his paternal grandfather and paternal grandmother; Heart disease in his father; Stomach cancer in his paternal grandfather.    ROS:  Please see the history of present illness.   Otherwise, review of systems are positive for {NONE DEFAULTED:18576::"none"}.   All other systems are reviewed  and negative.    PHYSICAL EXAM: VS:  There were no vitals taken for this visit. , BMI There is no height or weight on file to calculate BMI. GEN: Well nourished, well developed, in no acute distress HEENT: normal Neck: no JVD, carotid bruits, or masses Cardiac: ***RRR; no murmurs, rubs, or gallops,no edema  Respiratory:  clear to auscultation bilaterally, normal work of breathing GI: soft, nontender, nondistended, + BS MS: no deformity or atrophy Skin: warm and dry, no rash Neuro:  Strength and sensation are intact Psych: euthymic mood, full affect   EKG:  EKG {ACTION; IS/IS GI:[id number] ordered today. The ekg ordered today demonstrates ***   Recent Labs: 01/05/2018: B Natriuretic Peptide 422.0 01/13/2018: Magnesium 1.6 01/15/2018: ALT 26 09/15/2018: BUN 14; Creatinine, Ser 0.99; Hemoglobin 15.6; Platelets 175; Potassium 5.1; Sodium 142    Lipid Panel    Component Value Date/Time   CHOL 204 (H) 03/15/2015 0558   TRIG 116 04/09/2015 0802   HDL 29 (L) 03/15/2015 0558   CHOLHDL 7.0 03/15/2015 0558   VLDL 26 03/15/2015 0558   LDLCALC 149 (H) 03/15/2015 0558      Wt Readings from Last 3 Encounters:   09/21/18 160 lb (72.6 kg)  09/15/18 158 lb (71.7 kg)  09/01/18 158 lb 12.8 oz (72 kg)      Other studies Reviewed: Additional studies/ records that were reviewed today include:   Cath: 09/21/18   LM lesion is 50% stenosed.  Prox Cx lesion is 99% stenosed. Small diffusely diseased vessel.  LIMA to LAD is patent.  Prox Graft lesion is 80% stenosed.  A drug-eluting stent was successfully placed using a STENT SYNERGY DES 2.75X16.  Post intervention, there is a 0% residual stenosis.  Prox Graft lesion in the Y graft portion to the ramus is 100% stenosed.  Dist RCA lesion is 80% stenosed.  A drug-eluting stent was successfully placed using a STENT SYNERGY DES 2.5X16.  Post intervention, there is a 0% residual stenosis.  Mid RCA lesion is 75% stenosed.  A drug-eluting stent was successfully placed using a STENT SYNERGY DES 2.5X24.  Post intervention, there is a 0% residual stenosis.  Ost RCA to Prox RCA lesion is 75% stenosed.  A drug-eluting stent was successfully placed using a STENT SYNERGY DES 2.75X12.  Post intervention, there is a 0% residual stenosis.  1st RPL lesion is 80% stenosed. This vessel is too small for intervention.  The left ventricular systolic function is normal.  LV end diastolic pressure is mildly elevated.  The left ventricular ejection fraction is 55-65% by visual estimate.  There is no aortic valve stenosis.  Continue aggressive secondary prevention including dual antiplatelet therapy.   I spoke with the patient's wife and stressed the importance of DAPT.   Diagnostic Dominance: Right  Intervention    _____________  Echocardiogram 01/06/2018: Study Conclusions  - Left ventricle: The cavity size was normal. Wall thickness was   increased in a pattern of moderate LVH. Systolic function was   normal. The estimated ejection fraction was in the range of 55%   to 60%. - Aortic valve: Mildly calcified annulus. Mildly  thickened   leaflets. Valve area (VTI): 2.26 cm^2. Valve area (Vmax): 2.26   cm^2. - Mitral valve: Mildly calcified annulus. Mildly thickened leaflets   . - Technically difficult study.   ASSESSMENT AND PLAN:  1.  ***   Current medicines are reviewed at length with the patient today.  The patient {ACTIONS; HAS/DOES NOT HAVE:19233} concerns regarding medicines.  The following changes have been made:  {PLAN; NO CHANGE:13088:s}  Labs/ tests ordered today include: *** No orders of the defined types were placed in this encounter.    Disposition:   FU with *** in {gen number [id number] {Days to years:10300}  Signed, [name], NP  10/04/2018 7:41 AM    [facility] [place], [place], [place]  [address] Phone: [phone]; Fax: [phone]

## 2018-10-11 NOTE — Progress Notes (Signed)
Virtual Visit via Telephone Note   This visit type was conducted due to national recommendations for restrictions regarding the COVID-19 Pandemic (e.g. social distancing) in an effort to limit this patient's exposure and mitigate transmission in our community.  Due to his co-morbid illnesses, this patient is at least at moderate risk for complications without adequate follow up.  This format is felt to be most appropriate for this patient at this time.  The patient did not have access to video technology/had technical difficulties with video requiring transitioning to audio format only (telephone).  All issues noted in this document were discussed and addressed.  No physical exam could be performed with this format.  Please refer to the patient's chart for his  consent to telehealth for [facility].   Date:  10/11/2018   ID:  [name], DOB October 18, 1950, MRN [id number]  Patient Location: Home Provider Location: Home  PCP:  Wenda Low, MD  Cardiologist:  Candee Furbish, MD  Electrophysiologist:  None   Evaluation Performed:  Follow-Up Visit  Chief Complaint: Follow-up for same-day PCI, seen for [name]  History of Present Illness:    [name] is a 69 y.o. male with a history of coronary disease s/p CABG in 2009, cardiac catheterization in 2017with DES to native RCA, patent LIMA-LAD and SVG-Dx and free RIMA-ramus, occluded SVG-PDA and SVG-OM. Also with type III aortic dissection in 2008, descending thoracic and abdominal aortic aneurysms 4.9 cm, former tobacco abuse (quit December 2019), COPD, ethanol abuse, chronic back pain, anxiety, and depression  In December 2019he was hospitalized with chest pain and had mildly elevated troponins. Plan was for a cath buthe unfortunately had a retroperitoneal bleed and cardiac catheterization was deferred.  He then had a telemedicine visit with [name] 07/20/18 and was not having angina but having marked orthostatic BP changes with  significant dizziness.   He saw Dr. Roxan Hockey 09/01/18 with complaints of chest tightness with little exertion.  Dr. Roxan Hockey discussed with [name] who recommendedcardiac catheterization.  Cardiac cath performed 09/21/2018 with patent LIMA-LAD, 80% stenosis in the SVG-diagonal with PCI/DES x1, occluded SVG-RCA with PCI/DES to the native RCA x3.  Plan is for DAPT with ASA and Plavix for at least 1 year duration. He was seen by cardiac rehabilitation while in short stay.  There were no complications in the post-cath setting.  Radial site was stable at discharge.  Plan was to have an office visit 10/01/2018 however patient canceled and rescheduled for telehealth visit.  Today I am seeing [name] via telephone visit.  He states that since hospital discharge after stent placement he is felt fatigued and has been experiencing exertional shortness of breath.  He denies anginal symptoms.  He states this is not how he has felt after previous PCI, CABG surgery.  Prior to stent placement, he wore home supplemental oxygen as needed very seldom.  He states after coming home, he is needed this more frequently.  He denies orthopnea, LE swelling, weight gain or other heart failure symptoms.  He was previously not on Plavix.  This was added after PCI at discharge.  I am concerned that he may possibly not be tolerating Plavix therapy.  Low suspicion for stent failure.  Reports complete compliance since discharge.  Will discuss with [name] for next steps.   The patient does not have symptoms concerning for COVID-19 infection (fever, chills, cough, or new shortness of breath).    Past Medical History:  Diagnosis Date  .  Anemia   . Anxiety   . Aortic dissection (HCC)    TYPE 3  . CAD (coronary artery disease)    multiple, most recently Jan-March 2017  . Cardiac tamponade   . Chronic bronchitis ([place])   . Chronic fatigue   . Chronic pain   . Constipation   . COPD (chronic obstructive pulmonary disease)  ([place])   . Depression   . Dysphagia, oropharyngeal phase   . ETOH abuse   . GERD (gastroesophageal reflux disease)   . Hiatal hernia   . HTN (hypertension)   . Hypercholesterolemia   . Metabolic encephalopathy   . Opioid abuse ([place])   . Peptic ulcer disease 08/2010   EGD   . PNA (pneumonia)   . Schatzki's ring    Past Surgical History:  Procedure Laterality Date  . BACK SURGERY    . CABG X 6  07/31/2007   [facility]  . CARDIAC CATHETERIZATION    . CARDIAC CATHETERIZATION N/A 03/16/2015   Procedure: Coronary/Graft Angiography;  Surgeon: Sherren Mocha, MD;  Location: [place] [facility];  Service: Cardiovascular;  Laterality: N/A;  . CARDIAC CATHETERIZATION N/A 03/16/2015   Procedure: Coronary Stent Intervention;  Surgeon: Sherren Mocha, MD;  Location: [place] [facility];  Service: Cardiovascular;  Laterality: N/A;  . CORONARY STENT INTERVENTION N/A 09/21/2018   Procedure: CORONARY STENT INTERVENTION;  Surgeon: Jettie Booze, MD;  Location: [place] [facility];  Service: Cardiovascular;  Laterality: N/A;  . KNEE SURGERY Right    acl  . LEFT HEART CATH AND CORS/GRAFTS ANGIOGRAPHY N/A 09/21/2018   Procedure: LEFT HEART CATH AND CORS/GRAFTS ANGIOGRAPHY;  Surgeon: Jettie Booze, MD;  Location: [place] [facility];  Service: Cardiovascular;  Laterality: N/A;  . OLECRANON BURSECTOMY Left 08/27/2012   Procedure: EXCISION LEFT OLECRANON BURSA;  Surgeon: Sanjuana Kava, MD;  Location: [facility];  Service: Orthopedics;  Laterality: Left;  . OLECRANON BURSECTOMY Left 05/25/2013   Procedure:  Excision sinus tract and tissue left elbow ;  Surgeon: Sanjuana Kava, MD;  Location: [facility];  Service: Orthopedics;  Laterality: Left;  . ORIF SCAPULAR FRACTURE Right   . SHOULDER SURGERY Left   . STERNAL WIRE REMOVEAL  12/31/2007   [facility]     No outpatient medications have been marked as taking for the 10/12/18 encounter (Appointment) with Tommie Raymond, NP.     Allergies:   Nsaids, Bee  venom, and Codeine   Social History   Tobacco Use  . Smoking status: Former Smoker    Packs/day: 1.00    Years: 20.00    Pack years: 20.00    Types: Cigarettes    Quit date: 12/28/2017    Years since quitting: 0.7  . Smokeless tobacco: Former Systems developer    Quit date: 06/05/2015  . Tobacco comment: 04/30/16- smoking 1 cig per day//lmr  Substance Use Topics  . Alcohol use: No    Alcohol/week: 0.0 standard drinks    Comment: denies  . Drug use: No     Family Hx: The patient's family history includes Asthma in his sister; Emphysema in his paternal grandfather and paternal grandmother; Heart disease in his father; Stomach cancer in his paternal grandfather.  ROS:   Please see the history of present illness.     All other systems reviewed and are negative.  Prior CV studies:   The following studies were reviewed today:  Cath: 09/21/18   LM lesion is 50% stenosed.  Prox Cx lesion is 99% stenosed. Small diffusely diseased  vessel.  LIMA to LAD is patent.  Prox Graft lesion is 80% stenosed.  A drug-eluting stent was successfully placed using a STENT SYNERGY DES 2.75X16.  Post intervention, there is a 0% residual stenosis.  Prox Graft lesion in the Y graft portion to the ramus is 100% stenosed.  Dist RCA lesion is 80% stenosed.  A drug-eluting stent was successfully placed using a STENT SYNERGY DES 2.5X16.  Post intervention, there is a 0% residual stenosis.  Mid RCA lesion is 75% stenosed.  A drug-eluting stent was successfully placed using a STENT SYNERGY DES 2.5X24.  Post intervention, there is a 0% residual stenosis.  Ost RCA to Prox RCA lesion is 75% stenosed.  A drug-eluting stent was successfully placed using a STENT SYNERGY DES 2.75X12.  Post intervention, there is a 0% residual stenosis.  1st RPL lesion is 80% stenosed. This vessel is too small for intervention.  The left ventricular systolic function is normal.  LV end diastolic pressure is mildly  elevated.  The left ventricular ejection fraction is 55-65% by visual estimate.  There is no aortic valve stenosis.  Continue aggressive secondary prevention including dual antiplatelet therapy.   I spoke with the patient's wife and stressed the importance of DAPT.   Diagnostic Dominance: Right  Intervention     Labs/Other Tests and Data Reviewed:    EKG:  No ECG reviewed.  Recent Labs: 01/05/2018: B Natriuretic Peptide 422.0 01/13/2018: Magnesium 1.6 01/15/2018: ALT 26 09/15/2018: BUN 14; Creatinine, Ser 0.99; Hemoglobin 15.6; Platelets 175; Potassium 5.1; Sodium 142   Recent Lipid Panel Lab Results  Component Value Date/Time   CHOL 204 (H) 03/15/2015 05:58 AM   TRIG 116 04/09/2015 08:02 AM   HDL 29 (L) 03/15/2015 05:58 AM   CHOLHDL 7.0 03/15/2015 05:58 AM   LDLCALC 149 (H) 03/15/2015 05:58 AM    Wt Readings from Last 3 Encounters:  09/21/18 160 lb (72.6 kg)  09/15/18 158 lb (71.7 kg)  09/01/18 158 lb 12.8 oz (72 kg)     Objective:    Vital Signs:  There were no vitals taken for this visit.   VITAL SIGNS:  reviewed GEN:  no acute distress RESPIRATORY:  normal respiratory effort NEURO:  alert and oriented x 3 PSYCH:  normal affect  ASSESSMENT & PLAN:    1.  CAD s/p CABG 2013, DES to native RCA 2017: -Most recent cath 09/21/2018 with PCI/DES to SVG diagonal and PCI/DES to the native RCA x3 -Plan for DAPT with ASA and Plavix for at least 1 year duration>> Plavix is new post PCI -Denies recurrent anginal symptoms however has been having more exertional dyspnea requiring more frequent supplemental O2 more at night -Some concern with intolerance to Plavix versus stent failure?  Will discuss next steps with [name].  May need to change to Brilinta for antiplatelet therapy and reassess symptoms -Denies similar symptoms to prior MI, stent placement -EF per cath report 55 to 60%, has no heart failure symptoms  2.  Aortic dissection: -Type III aortic  dissection 2008, descending thoracic and AAA 4.9 cm followed by Dr. Roxan Hockey -No issues  3.  Hypertension: -Low normal, 100/70 -No dizziness -Continue current regimen  4.  HLD: -Last LDL, 93 on 07/2018 -Atorvastatin increased to 40 mg daily on 09/15/2018 given elevated LDL -Plan for repeat labs 11/2018, lipid panel/LFTs   COVID-19 Education: The signs and symptoms of COVID-19 were discussed with the patient and how to seek care for testing (follow up with PCP or arrange  E-visit).  The importance of social distancing was discussed today.  Time:   Today, I have spent 20 minutes with the patient with telehealth technology discussing the above problems.     Medication Adjustments/Labs and Tests Ordered: Current medicines are reviewed at length with the patient today.  Concerns regarding medicines are outlined above.   Tests Ordered: No orders of the defined types were placed in this encounter.   Medication Changes: No orders of the defined types were placed in this encounter.   Follow Up:  Virtual Visit or In Person APP or [name] in 2 weeks  Signed, [name], NP  10/11/2018 6:45 AM    [place]

## 2018-10-12 ENCOUNTER — Encounter: Payer: Self-pay | Admitting: Cardiology

## 2018-10-12 ENCOUNTER — Telehealth (INDEPENDENT_AMBULATORY_CARE_PROVIDER_SITE_OTHER): Payer: Medicare HMO | Admitting: Cardiology

## 2018-10-12 ENCOUNTER — Other Ambulatory Visit: Payer: Self-pay

## 2018-10-12 VITALS — BP 100/70 | HR 71 | Ht 70.0 in | Wt 165.0 lb

## 2018-10-12 DIAGNOSIS — I714 Abdominal aortic aneurysm, without rupture, unspecified: Secondary | ICD-10-CM

## 2018-10-12 DIAGNOSIS — I10 Essential (primary) hypertension: Secondary | ICD-10-CM

## 2018-10-12 DIAGNOSIS — E7800 Pure hypercholesterolemia, unspecified: Secondary | ICD-10-CM | POA: Diagnosis not present

## 2018-10-12 DIAGNOSIS — I2510 Atherosclerotic heart disease of native coronary artery without angina pectoris: Secondary | ICD-10-CM

## 2018-10-12 NOTE — Patient Instructions (Signed)
Medication Instructions:  Your physician recommends that you continue on your current medications as directed. Please refer to the Current Medication list given to you today.  If you need a refill on your cardiac medications before your next appointment, please call your pharmacy.   Lab work: NONE  Testing/Procedures: NONE  Follow-Up: At [facility], you and your health needs are our priority.  As part of our continuing mission to provide you with exceptional heart care, we have created designated Provider Care Teams.  These Care Teams include your primary Cardiologist (physician) and Advanced Practice Providers (APPs -  Physician Assistants and Nurse Practitioners) who all work together to provide you with the care you need, when you need it. You will need a follow up appointment in 2-3 weeks.  Please call our office 2 months in advance to schedule this appointment.  You may see Candee Furbish, MD or one of the following Advanced Practice Providers on your designated Care Team:   Truitt Merle, NP Cecilie Kicks, NP . [name], NP

## 2018-10-13 ENCOUNTER — Ambulatory Visit: Payer: Medicare HMO | Admitting: Cardiology

## 2018-10-13 ENCOUNTER — Telehealth: Payer: Self-pay | Admitting: Cardiology

## 2018-10-13 NOTE — Progress Notes (Signed)
I would suggest that we continue to hold the course with his Plavix and aspirin.  Continue to work on conditioning.  Ejection fraction normal. Candee Furbish, MD

## 2018-10-13 NOTE — Telephone Encounter (Signed)
I spoke to the patient with Jill's/Dr Marlou Porch' recommendation.  He verbalized understanding.

## 2018-10-13 NOTE — Telephone Encounter (Signed)
    Please let the patient know that I discussed his case with [name] who would like to continue with his ASA and aspirin therapy at this time.  Please make sure that he has close follow-up for reassessment and to call the office if his symptoms worsen.  [name] NP-[facility] [place] Pager: [phone]

## 2018-10-23 ENCOUNTER — Telehealth: Payer: Self-pay | Admitting: *Deleted

## 2018-10-23 NOTE — Telephone Encounter (Signed)
Called pt to reschedule his appt.  Pt has many questions.  He has been having episodes of SOB with activity and dizziness almost constantly since 09/23/2018.  He also received a letter from his life insurance company who had recently done blood work and they reported his pro-BNP was extremely high (but did not tell him the actual number)  His life insurance has been put on hold b/c of this. Pt denies any edema at feet/ankles or abdomen. His wt is 165 lbs which is up for him but he feels this is from eating a lot more - not fluid.  He doesn't general wt daily.  He is using inhalers an increased amount d/t his continued SOB.  His f/u appts with pulmonary for further testing have been cancelled d/t COVID-19 at this time. Advised I will send this information to Dr Marlou Porch for his knowledge prior to his appt with him on Monday.  Pt states understanding and was appreciative of the call and information.  He will keep a BP log through the weekend for Dr Marlou Porch' review at appt.

## 2018-10-26 ENCOUNTER — Telehealth (INDEPENDENT_AMBULATORY_CARE_PROVIDER_SITE_OTHER): Payer: Medicare HMO | Admitting: Cardiology

## 2018-10-26 ENCOUNTER — Other Ambulatory Visit: Payer: Self-pay

## 2018-10-26 VITALS — BP 100/70 | HR 71 | Ht 70.0 in | Wt 165.0 lb

## 2018-10-26 DIAGNOSIS — I2510 Atherosclerotic heart disease of native coronary artery without angina pectoris: Secondary | ICD-10-CM

## 2018-10-26 DIAGNOSIS — I10 Essential (primary) hypertension: Secondary | ICD-10-CM

## 2018-10-26 DIAGNOSIS — I714 Abdominal aortic aneurysm, without rupture, unspecified: Secondary | ICD-10-CM

## 2018-10-26 DIAGNOSIS — Z95820 Peripheral vascular angioplasty status with implants and grafts: Secondary | ICD-10-CM

## 2018-10-26 DIAGNOSIS — E782 Mixed hyperlipidemia: Secondary | ICD-10-CM

## 2018-10-26 NOTE — Progress Notes (Signed)
Virtual Visit via Telephone Note   This visit type was conducted due to national recommendations for restrictions regarding the COVID-19 Pandemic (e.g. social distancing) in an effort to limit this patient's exposure and mitigate transmission in our community.  Due to his co-morbid illnesses, this patient is at least at moderate risk for complications without adequate follow up.  This format is felt to be most appropriate for this patient at this time.  The patient did not have access to video technology/had technical difficulties with video requiring transitioning to audio format only (telephone).  All issues noted in this document were discussed and addressed.  No physical exam could be performed with this format.  Please refer to the patient's chart for his  consent to telehealth for [facility].   Telephone visit.  18 minutes.  Date:  10/26/2018   ID:  [name], DOB 01/09/1951, MRN [id number]  Patient Location: Home Provider Location: Home  PCP:  Wenda Low, MD  Cardiologist:  Candee Furbish, MD  Electrophysiologist:  None   Evaluation Performed:  Follow-Up Visit  Chief Complaint: Fatigue once again  History of Present Illness:    [name] is a 68 y.o. male with coronary artery disease status post CABG with last cardiac catheterization in 2017 reviewed showing patent LIMA who in December 2019 was hospitalized and found to have retroperitoneal bleed, COPD, flat but elevated troponin.  He did not undergo cardiac catheterization at that time because of bleed.  He feels tunnel vision, marked fatigue, wobbly when standing up at times.  Denies any chest pain.  He has been following with Dr. Roxan Hockey and Dr. Donnetta Hutching watching his aortic aneurysm/dissections.  Smoking has decreased.  10/26/18 - Cant stop shaking inside. Staggering, vision goes, blurry and cant see. Not black, SOB. Sit down and stop. 100/70 in morning, 150/90 by 11am. Afternoon 190/112. Has to go lay down. Head  clear, 140. Any exertion send different directions. Got letter from insurance. BNP elevated. Head ache.   The patient does not have symptoms concerning for COVID-19 infection (fever, chills, cough, or new shortness of breath).    Past Medical History:  Diagnosis Date  . Anemia   . Anxiety   . Aortic dissection (HCC)    TYPE 3  . CAD (coronary artery disease)    multiple, most recently Jan-March 2017  . Cardiac tamponade   . Chronic bronchitis ([place])   . Chronic fatigue   . Chronic pain   . Constipation   . COPD (chronic obstructive pulmonary disease) ([place])   . Depression   . Dysphagia, oropharyngeal phase   . ETOH abuse   . GERD (gastroesophageal reflux disease)   . Hiatal hernia   . HTN (hypertension)   . Hypercholesterolemia   . Metabolic encephalopathy   . Opioid abuse ([place])   . Peptic ulcer disease 08/2010   EGD   . PNA (pneumonia)   . Schatzki's ring    Past Surgical History:  Procedure Laterality Date  . BACK SURGERY    . CABG X 6  07/31/2007   [facility]  . CARDIAC CATHETERIZATION    . CARDIAC CATHETERIZATION N/A 03/16/2015   Procedure: Coronary/Graft Angiography;  Surgeon: Sherren Mocha, MD;  Location: [place] [facility];  Service: Cardiovascular;  Laterality: N/A;  . CARDIAC CATHETERIZATION N/A 03/16/2015   Procedure: Coronary Stent Intervention;  Surgeon: Sherren Mocha, MD;  Location: [place] [facility];  Service: Cardiovascular;  Laterality: N/A;  . CORONARY STENT INTERVENTION N/A 09/21/2018  Procedure: CORONARY STENT INTERVENTION;  Surgeon: Jettie Booze, MD;  Location: [place] [facility];  Service: Cardiovascular;  Laterality: N/A;  . KNEE SURGERY Right    acl  . LEFT HEART CATH AND CORS/GRAFTS ANGIOGRAPHY N/A 09/21/2018   Procedure: LEFT HEART CATH AND CORS/GRAFTS ANGIOGRAPHY;  Surgeon: Jettie Booze, MD;  Location: [place] [facility];  Service: Cardiovascular;  Laterality: N/A;  . OLECRANON BURSECTOMY Left 08/27/2012   Procedure: EXCISION LEFT  OLECRANON BURSA;  Surgeon: Sanjuana Kava, MD;  Location: [facility];  Service: Orthopedics;  Laterality: Left;  . OLECRANON BURSECTOMY Left 05/25/2013   Procedure:  Excision sinus tract and tissue left elbow ;  Surgeon: Sanjuana Kava, MD;  Location: [facility];  Service: Orthopedics;  Laterality: Left;  . ORIF SCAPULAR FRACTURE Right   . SHOULDER SURGERY Left   . STERNAL WIRE REMOVEAL  12/31/2007   [facility]     Current Meds  Medication Sig  . albuterol (PROVENTIL) (2.5 MG/3ML) 0.083% nebulizer solution Take 3 mLs (2.5 mg total) by nebulization every 6 (six) hours as needed for wheezing or shortness of breath.  Marland Kitchen albuterol (VENTOLIN HFA) 108 (90 Base) MCG/ACT inhaler Inhale 2 puffs into the lungs every 6 (six) hours as needed for wheezing or shortness of breath.  . ALPRAZolam (XANAX) 0.25 MG tablet Take 1 tablet (0.25 mg total) by mouth 2 (two) times daily as needed for anxiety.  Marland Kitchen amLODipine (NORVASC) 5 MG tablet Take 5 mg by mouth daily.  Marland Kitchen aspirin EC 81 MG tablet Take 1 tablet (81 mg total) by mouth daily.  Marland Kitchen atorvastatin (LIPITOR) 40 MG tablet Take 1 tablet (40 mg total) by mouth daily.  . busPIRone (BUSPAR) 7.5 MG tablet Take 1 tablet (7.5 mg total) by mouth 3 (three) times daily.  . clopidogrel (PLAVIX) 75 MG tablet Take 1 tablet (75 mg total) by mouth daily.  . feeding supplement, ENSURE ENLIVE, (ENSURE ENLIVE) LIQD Take 237 mLs by mouth 2 (two) times daily between meals.  . Fluticasone-Umeclidin-Vilant (TRELEGY ELLIPTA) 100-62.5-25 MCG/INH AEPB Inhale 1 puff into the lungs daily.  Marland Kitchen gabapentin (NEURONTIN) 300 MG capsule Take 300 mg by mouth 3 (three) times daily.  Marland Kitchen levothyroxine (SYNTHROID, LEVOTHROID) 50 MCG tablet Take 50 mcg by mouth daily.  . metoprolol tartrate (LOPRESSOR) 25 MG tablet Take 1 tablet (25 mg total) by mouth 3 (three) times daily.  . nitroGLYCERIN (NITROSTAT) 0.4 MG SL tablet Place 1 tablet (0.4 mg total) under the tongue every 5 (five) minutes as needed for chest pain.   . OXYGEN Inhale 3.5 L into the lungs daily as needed (for shortness of breath). 3-3.5lpm 24/7   . pantoprazole (PROTONIX) 40 MG tablet Take 1 tablet (40 mg total) by mouth at bedtime.  . traMADol (ULTRAM) 50 MG tablet Take 100 mg by mouth 2 (two) times daily.   . traZODone (DESYREL) 100 MG tablet Take 1 tablet (100 mg total) by mouth at bedtime as needed for sleep. (Patient taking differently: Take 100 mg by mouth at bedtime. )     Allergies:   Nsaids, Bee venom, and Codeine   Social History   Tobacco Use  . Smoking status: Former Smoker    Packs/day: 1.00    Years: 20.00    Pack years: 20.00    Types: Cigarettes    Quit date: 12/28/2017    Years since quitting: 0.8  . Smokeless tobacco: Former Systems developer    Quit date: 06/05/2015  . Tobacco comment: 04/30/16- smoking 1 cig per  day//lmr  Substance Use Topics  . Alcohol use: No    Alcohol/week: 0.0 standard drinks    Comment: denies  . Drug use: No     Family Hx: The patient's family history includes Asthma in his sister; Emphysema in his paternal grandfather and paternal grandmother; Heart disease in his father; Stomach cancer in his paternal grandfather.  ROS:   Please see the history of present illness.    Denies any fevers chills nausea vomiting syncope bleeding All other systems reviewed and are negative.   Prior CV studies:   The following studies were reviewed today:  Echocardiogram showed normal EF in 2019.  Cath 2020 Diagnostic Dominance: Right   Intervention    Labs/Other Tests and Data Reviewed:    EKG:  Sinus rhythm with nonspecific ST-T wave changes  Recent Labs: 01/05/2018: B Natriuretic Peptide 422.0 01/13/2018: Magnesium 1.6 01/15/2018: ALT 26 09/15/2018: BUN 14; Creatinine, Ser 0.99; Hemoglobin 15.6; Platelets 175; Potassium 5.1; Sodium 142   Recent Lipid Panel Lab Results  Component Value Date/Time   CHOL 204 (H) 03/15/2015 05:58 AM   TRIG 116 04/09/2015 08:02 AM   HDL 29 (L) 03/15/2015 05:58  AM   CHOLHDL 7.0 03/15/2015 05:58 AM   LDLCALC 149 (H) 03/15/2015 05:58 AM    Wt Readings from Last 3 Encounters:  10/26/18 165 lb (74.8 kg)  10/12/18 165 lb (74.8 kg)  09/21/18 160 lb (72.6 kg)     Objective:    Vital Signs:  BP 100/70   Pulse 71   Ht 5\' 10"  (1.778 m)   Wt 165 lb (74.8 kg)   BMI 23.68 kg/m    VITAL SIGNS:  reviewed able to complete full sentences without any difficulty.  Pleasant  ASSESSMENT & PLAN:    Coronary artery disease post CABG - Prior cardiac catheterization reviewed in 2017 and 09/21/2018.  5 stents were placed.  Hopefully this will help improve blood flow and overall angina.  He was hospitalized in December 2019 with elevated troponin relatively flat.  Retroperitoneal bleed.   He is overall fatigued as usual.  continue with medical management, expressed the importance of dual antiplatelet therapy.  Aortopathy - Dr. Roxan Hockey and Dr. Donnetta Hutching are watching.  AAA 4.9 cm.  Maximize blood pressure control.  No changes made  Essential hypertension - Blood pressures still remain very elevated however when he stands he has tunnel vision and feels near faint.  A case manager took his blood pressure when standing and is top number went down by 100 points he states.  Marked orthostasis.    We will keep an eye on this.  He has quite labile blood pressures.  In the early mornings it is quite low.  Afternoon can be quite high.  Anxiety  -Could this be contributed to in some of his shaking.  He has done with this for some time.  He will talk to Dr. Lysle Rubens.  COVID-19 Education: The signs and symptoms of COVID-19 were discussed with the patient and how to seek care for testing (follow up with PCP or arrange E-visit).  The importance of social distancing was discussed today.  Time:   Today, I have spent 21 minutes with the patient with telehealth technology discussing the above problems, reviewing his records in this complex medical management situation.      Medication Adjustments/Labs and Tests Ordered: Current medicines are reviewed at length with the patient today.  Concerns regarding medicines are outlined above.   Tests Ordered: No orders of the  defined types were placed in this encounter.   Medication Changes: No orders of the defined types were placed in this encounter.   Follow Up:  Virtual Visit or In Person in 6 month(s)    Signed, Candee Furbish, MD  10/26/2018 8:56 AM    [place]

## 2018-10-26 NOTE — Patient Instructions (Signed)
Medication Instructions:  Your physician recommends that you continue on your current medications as directed. Please refer to the Current Medication list given to you today.   If you need a refill on your cardiac medications before your next appointment, please call your pharmacy.   Lab work: none If you have labs (blood work) drawn today and your tests are completely normal, you will receive your results only by: Marland Kitchen MyChart Message (if you have MyChart) OR . A paper copy in the mail If you have any lab test that is abnormal or we need to change your treatment, we will call you to review the results.  Testing/Procedures: none  Follow-Up: At [facility], you and your health needs are our priority.  As part of our continuing mission to provide you with exceptional heart care, we have created designated Provider Care Teams.  These Care Teams include your primary Cardiologist (physician) and Advanced Practice Providers (APPs -  Physician Assistants and Nurse Practitioners) who all work together to provide you with the care you need, when you need it. You will need a follow up appointment in 6 months--virtual visit with APP.  Please call our office 2 months in advance to schedule this appointment.   Truitt Merle, NP Cecilie Kicks, NP . [name], NP  Any Other Special Instructions Will Be Listed Below (If Applicable).

## 2018-10-27 ENCOUNTER — Ambulatory Visit: Payer: Medicare HMO | Admitting: Cardiology

## 2018-10-27 NOTE — Telephone Encounter (Signed)
S/S addressed at virtual visit 10/5.

## 2018-11-02 ENCOUNTER — Other Ambulatory Visit: Payer: Self-pay

## 2018-11-02 ENCOUNTER — Emergency Department (HOSPITAL_COMMUNITY): Payer: Medicare HMO

## 2018-11-02 ENCOUNTER — Inpatient Hospital Stay (HOSPITAL_COMMUNITY)
Admission: EM | Admit: 2018-11-02 | Discharge: 2018-11-05 | DRG: 190 | Disposition: A | Discharge status: Home / Self Care | Payer: Medicare HMO | Attending: Internal Medicine | Admitting: Internal Medicine

## 2018-11-02 DIAGNOSIS — Z886 Allergy status to analgesic agent status: Secondary | ICD-10-CM

## 2018-11-02 DIAGNOSIS — R05 Cough: Secondary | ICD-10-CM | POA: Diagnosis not present

## 2018-11-02 DIAGNOSIS — J9601 Acute respiratory failure with hypoxia: Secondary | ICD-10-CM

## 2018-11-02 DIAGNOSIS — A419 Sepsis, unspecified organism: Secondary | ICD-10-CM | POA: Diagnosis not present

## 2018-11-02 DIAGNOSIS — M549 Dorsalgia, unspecified: Secondary | ICD-10-CM | POA: Diagnosis present

## 2018-11-02 DIAGNOSIS — Z87891 Personal history of nicotine dependence: Secondary | ICD-10-CM

## 2018-11-02 DIAGNOSIS — Z800 Family history of malignant neoplasm of digestive organs: Secondary | ICD-10-CM | POA: Diagnosis not present

## 2018-11-02 DIAGNOSIS — J9611 Chronic respiratory failure with hypoxia: Secondary | ICD-10-CM | POA: Diagnosis present

## 2018-11-02 DIAGNOSIS — R000 Tachycardia, unspecified: Secondary | ICD-10-CM | POA: Diagnosis not present

## 2018-11-02 DIAGNOSIS — Z20828 Contact with and (suspected) exposure to other viral communicable diseases: Secondary | ICD-10-CM | POA: Diagnosis present

## 2018-11-02 DIAGNOSIS — I252 Old myocardial infarction: Secondary | ICD-10-CM | POA: Diagnosis not present

## 2018-11-02 DIAGNOSIS — Z955 Presence of coronary angioplasty implant and graft: Secondary | ICD-10-CM

## 2018-11-02 DIAGNOSIS — Z825 Family history of asthma and other chronic lower respiratory diseases: Secondary | ICD-10-CM

## 2018-11-02 DIAGNOSIS — Z7982 Long term (current) use of aspirin: Secondary | ICD-10-CM | POA: Diagnosis not present

## 2018-11-02 DIAGNOSIS — I714 Abdominal aortic aneurysm, without rupture, unspecified: Secondary | ICD-10-CM | POA: Diagnosis present

## 2018-11-02 DIAGNOSIS — I2510 Atherosclerotic heart disease of native coronary artery without angina pectoris: Secondary | ICD-10-CM | POA: Diagnosis not present

## 2018-11-02 DIAGNOSIS — Z79890 Hormone replacement therapy (postmenopausal): Secondary | ICD-10-CM | POA: Diagnosis not present

## 2018-11-02 DIAGNOSIS — E7800 Pure hypercholesterolemia, unspecified: Secondary | ICD-10-CM | POA: Diagnosis present

## 2018-11-02 DIAGNOSIS — I7772 Dissection of iliac artery: Secondary | ICD-10-CM | POA: Diagnosis not present

## 2018-11-02 DIAGNOSIS — R0689 Other abnormalities of breathing: Secondary | ICD-10-CM | POA: Diagnosis not present

## 2018-11-02 DIAGNOSIS — R1312 Dysphagia, oropharyngeal phase: Secondary | ICD-10-CM | POA: Diagnosis present

## 2018-11-02 DIAGNOSIS — K219 Gastro-esophageal reflux disease without esophagitis: Secondary | ICD-10-CM | POA: Diagnosis not present

## 2018-11-02 DIAGNOSIS — J9621 Acute and chronic respiratory failure with hypoxia: Secondary | ICD-10-CM | POA: Diagnosis not present

## 2018-11-02 DIAGNOSIS — Z91030 Bee allergy status: Secondary | ICD-10-CM | POA: Diagnosis not present

## 2018-11-02 DIAGNOSIS — Z9981 Dependence on supplemental oxygen: Secondary | ICD-10-CM | POA: Diagnosis not present

## 2018-11-02 DIAGNOSIS — J441 Chronic obstructive pulmonary disease with (acute) exacerbation: Principal | ICD-10-CM | POA: Diagnosis present

## 2018-11-02 DIAGNOSIS — R1310 Dysphagia, unspecified: Secondary | ICD-10-CM | POA: Diagnosis present

## 2018-11-02 DIAGNOSIS — F419 Anxiety disorder, unspecified: Secondary | ICD-10-CM | POA: Diagnosis present

## 2018-11-02 DIAGNOSIS — R6510 Systemic inflammatory response syndrome (SIRS) of non-infectious origin without acute organ dysfunction: Secondary | ICD-10-CM | POA: Diagnosis present

## 2018-11-02 DIAGNOSIS — I7101 Dissection of thoracic aorta: Secondary | ICD-10-CM | POA: Diagnosis not present

## 2018-11-02 DIAGNOSIS — H538 Other visual disturbances: Secondary | ICD-10-CM | POA: Diagnosis not present

## 2018-11-02 DIAGNOSIS — Z8249 Family history of ischemic heart disease and other diseases of the circulatory system: Secondary | ICD-10-CM

## 2018-11-02 DIAGNOSIS — Z7902 Long term (current) use of antithrombotics/antiplatelets: Secondary | ICD-10-CM | POA: Diagnosis not present

## 2018-11-02 DIAGNOSIS — I7100 Dissection of unspecified site of aorta: Secondary | ICD-10-CM | POA: Diagnosis not present

## 2018-11-02 DIAGNOSIS — Z885 Allergy status to narcotic agent status: Secondary | ICD-10-CM

## 2018-11-02 DIAGNOSIS — I10 Essential (primary) hypertension: Secondary | ICD-10-CM | POA: Diagnosis present

## 2018-11-02 DIAGNOSIS — G894 Chronic pain syndrome: Secondary | ICD-10-CM | POA: Diagnosis not present

## 2018-11-02 DIAGNOSIS — E785 Hyperlipidemia, unspecified: Secondary | ICD-10-CM | POA: Diagnosis present

## 2018-11-02 DIAGNOSIS — R509 Fever, unspecified: Secondary | ICD-10-CM | POA: Diagnosis not present

## 2018-11-02 DIAGNOSIS — I25118 Atherosclerotic heart disease of native coronary artery with other forms of angina pectoris: Secondary | ICD-10-CM | POA: Diagnosis not present

## 2018-11-02 DIAGNOSIS — Z23 Encounter for immunization: Secondary | ICD-10-CM | POA: Diagnosis not present

## 2018-11-02 DIAGNOSIS — R42 Dizziness and giddiness: Secondary | ICD-10-CM | POA: Diagnosis not present

## 2018-11-02 DIAGNOSIS — R069 Unspecified abnormalities of breathing: Secondary | ICD-10-CM | POA: Diagnosis not present

## 2018-11-02 DIAGNOSIS — E039 Hypothyroidism, unspecified: Secondary | ICD-10-CM | POA: Diagnosis present

## 2018-11-02 LAB — URINALYSIS, ROUTINE W REFLEX MICROSCOPIC
Bacteria, UA: NONE SEEN
Bilirubin Urine: NEGATIVE
Glucose, UA: NEGATIVE mg/dL
Ketones, ur: NEGATIVE mg/dL
Leukocytes,Ua: NEGATIVE
Nitrite: NEGATIVE
Protein, ur: NEGATIVE mg/dL
Specific Gravity, Urine: 1.009 (ref 1.005–1.030)
pH: 5 (ref 5.0–8.0)

## 2018-11-02 LAB — CBC WITH DIFFERENTIAL/PLATELET
Abs Immature Granulocytes: 0.03 10*3/uL (ref 0.00–0.07)
Basophils Absolute: 0 10*3/uL (ref 0.0–0.1)
Basophils Relative: 0 %
Eosinophils Absolute: 0.1 10*3/uL (ref 0.0–0.5)
Eosinophils Relative: 1 %
HCT: 40.8 % (ref 39.0–52.0)
Hemoglobin: 13.2 g/dL (ref 13.0–17.0)
Immature Granulocytes: 0 %
Lymphocytes Relative: 3 %
Lymphs Abs: 0.3 10*3/uL — ABNORMAL LOW (ref 0.7–4.0)
MCH: 33.1 pg (ref 26.0–34.0)
MCHC: 32.4 g/dL (ref 30.0–36.0)
MCV: 102.3 fL — ABNORMAL HIGH (ref 80.0–100.0)
Monocytes Absolute: 0.7 10*3/uL (ref 0.1–1.0)
Monocytes Relative: 5 %
Neutro Abs: 12.6 10*3/uL — ABNORMAL HIGH (ref 1.7–7.7)
Neutrophils Relative %: 91 %
Platelets: 190 10*3/uL (ref 150–400)
RBC: 3.99 MIL/uL — ABNORMAL LOW (ref 4.22–5.81)
RDW: 13.6 % (ref 11.5–15.5)
WBC: 13.8 10*3/uL — ABNORMAL HIGH (ref 4.0–10.5)
nRBC: 0 % (ref 0.0–0.2)

## 2018-11-02 MED ORDER — SODIUM CHLORIDE 0.9 % IV BOLUS
1000.0000 mL | Freq: Once | INTRAVENOUS | Status: AC
  Administered 2018-11-02: 1000 mL via INTRAVENOUS

## 2018-11-02 MED ORDER — VANCOMYCIN HCL 500 MG IV SOLR
500.0000 mg | Freq: Once | INTRAVENOUS | Status: AC
  Administered 2018-11-03: 500 mg via INTRAVENOUS
  Filled 2018-11-02: qty 500

## 2018-11-02 MED ORDER — SODIUM CHLORIDE 0.9 % IV SOLN
2.0000 g | Freq: Once | INTRAVENOUS | Status: AC
  Administered 2018-11-02: 2 g via INTRAVENOUS
  Filled 2018-11-02: qty 2

## 2018-11-02 MED ORDER — ONDANSETRON HCL 4 MG/2ML IJ SOLN
4.0000 mg | Freq: Once | INTRAMUSCULAR | Status: AC
  Administered 2018-11-02: 4 mg via INTRAVENOUS
  Filled 2018-11-02: qty 2

## 2018-11-02 MED ORDER — VANCOMYCIN HCL IN DEXTROSE 1-5 GM/200ML-% IV SOLN
1000.0000 mg | Freq: Once | INTRAVENOUS | Status: AC
  Administered 2018-11-03: 1000 mg via INTRAVENOUS
  Filled 2018-11-02: qty 200

## 2018-11-02 MED ORDER — SODIUM CHLORIDE 0.9 % IV SOLN
INTRAVENOUS | Status: DC | PRN
  Administered 2018-11-02 – 2018-11-03 (×2): via INTRAVENOUS

## 2018-11-02 MED ORDER — METRONIDAZOLE IN NACL 5-0.79 MG/ML-% IV SOLN
500.0000 mg | Freq: Once | INTRAVENOUS | Status: AC
  Administered 2018-11-03: 500 mg via INTRAVENOUS
  Filled 2018-11-02: qty 100

## 2018-11-02 MED ORDER — ALBUTEROL SULFATE HFA 108 (90 BASE) MCG/ACT IN AERS
6.0000 | INHALATION_SPRAY | Freq: Once | RESPIRATORY_TRACT | Status: AC
  Administered 2018-11-02: 6 via RESPIRATORY_TRACT
  Filled 2018-11-02: qty 6.7

## 2018-11-02 NOTE — Progress Notes (Signed)
Pharmacy Antibiotic Note  [name] is a 68 y.o. male admitted on 11/02/2018 with sepsis.  Pharmacy has been consulted for Vancomycin and Cefepime dosing.  Plan: Cefepime 2gm IV q8h Vancomycin 1500mg  loading dose then 1750 mg IV Q 24 hrs. Goal AUC 400-550. Expected AUC: 490 SCr used: 1 Will f/u renal function, micro data, and pt's clinical condition Vanc levels prn   Height: 5\' 10"  (177.8 cm) Weight: 165 lb (74.8 kg) IBW/kg (Calculated) : 73  Temp (24hrs), Avg:100.1 F (37.8 C), Min:100.1 F (37.8 C), Max:100.1 F (37.8 C)  Recent Labs  Lab 11/02/18 2334  WBC 13.8*  CREATININE 0.83  LATICACIDVEN 1.0    Estimated Creatinine Clearance: 89.2 mL/min (by C-G formula based on SCr of 0.83 mg/dL).    Allergies  Allergen Reactions  . Nsaids Shortness Of Breath  . Bee Venom Swelling    Neck swelling, passes out  . Codeine Other (See Comments)    Causes GI Upset    Antimicrobials this admission: 10/12 Vanc >>  10/12 Cefepime >>    Microbiology results: 10/12 BCx:  10/12 UCx:  10/12 COVID negative   Thank you for allowing pharmacy to be a part of this patient's care.  Sherlon Handing, PharmD, BCPS 11/03/2018 1:20 AM

## 2018-11-02 NOTE — ED Triage Notes (Signed)
Pt states he has been vomiting brown looking emesis

## 2018-11-02 NOTE — ED Provider Notes (Signed)
[facility] EMERGENCY DEPARTMENT Provider Note   CSN: [id number] Arrival date & time: 11/02/18  2200     History   Chief Complaint Chief Complaint  Patient presents with   Fever    HPI [name] is a 68 y.o. male.     Patient with history of CAD status post 5 stents placed on August 31, COPD on as needed oxygen, chronic aortic dissection and AAA, hypertension, GERD presenting from home with shortness of breath for the past 1 day associated with fatigue, fever up to 103, cough and posttussive emesis.  States he has been coughing up brown-green mucus and has had several episodes of posttussive emesis that have been dark brown in color.  Fever to 103 at home.  Feels short of breath is worse with exertion despite using his nebulizers at home.  Denies chest pain.  States he has been feeling "dizzy" ever since his stents were put in on August 31 and was found to be orthostatic by his cardiologist.  He denies any vertigo.  Denies any focal weakness, numbness or tingling.  He denies any difficulty breathing or difficulty swallowing.  He has no abdominal pain or blood in the stool.  No diarrhea.  EMS reports he was wheezing and gave him some albuterol as well as saline.  He denies any known coronavirus exposures or sick contacts.  He saw his PCP in the office today for his dizziness and was prescribed meclizine and a head CT was ordered.  EMS was called with increasing shortness of breath, cough and fever.  The history is provided by the patient and the EMS personnel. The history is limited by the condition of the patient.  Fever Associated symptoms: chest pain, chills, cough, headaches, myalgias, nausea and vomiting   Associated symptoms: no congestion, no diarrhea, no dysuria and no rhinorrhea     Past Medical History:  Diagnosis Date   Anemia    Anxiety    Aortic dissection (HCC)    TYPE 3   CAD (coronary artery disease)    multiple, most recently Jan-March 2017   Cardiac  tamponade    Chronic bronchitis (HCC)    Chronic fatigue    Chronic pain    Constipation    COPD (chronic obstructive pulmonary disease) (HCC)    Depression    Dysphagia, oropharyngeal phase    ETOH abuse    GERD (gastroesophageal reflux disease)    Hiatal hernia    HTN (hypertension)    Hypercholesterolemia    Metabolic encephalopathy    Opioid abuse ([place])    Peptic ulcer disease 08/2010   EGD    PNA (pneumonia)    Schatzki's ring     Patient Active Problem List   Diagnosis Date Noted   Other chest pain    Anxiety 01/06/2018   Ischemic chest pain ([place]) 01/06/2018   SIRS (systemic inflammatory response syndrome) ([place])    Acute on chronic respiratory failure with hypoxia ([place]) 12/31/2017   Insomnia secondary to depression with anxiety 12/31/2017   Chronic respiratory failure with hypoxia ([place]) 05/01/2016   Dyspnea on exertion 04/30/2016   Hypokalemia 11/17/2015   Hyperglycemia 11/17/2015   COPD exacerbation ([place]) 08/03/2015   Orthostatic hypotension    Acute kidney injury ([place])    Altered mental status 05/11/2015   AKI (acute kidney injury) ([place]) 05/11/2015   Decubitus ulcer of back, stage 2 ([place]) 05/11/2015   GERD without esophagitis 05/11/2015   Moderate protein malnutrition ([place]) 05/11/2015   Disorientation  05/11/2015   Malnutrition of moderate degree 04/04/2015   Acute on chronic respiratory failure (HCC)    Aspiration into airway    Descending thoracic dissection (HCC)    HCAP (healthcare-associated pneumonia)    Lactic acidosis    Tylenol toxicity    Faintness    NSTEMI (non-ST elevated myocardial infarction) (HCC)    Elevated troponin 03/14/2015   Acute myocardial infarction, subendocardial infarction, subsequent episode of care ([place]) 10/26/2014   Angina pectoris ([place]) 10/26/2014   COPD clinical dx/ could not perforom spirometry  10/26/2014   Arteriosclerosis of autologous vein coronary artery bypass graft  10/26/2014   Arteriosclerosis of coronary artery 10/26/2014   H/O adenomatous polyp of colon 10/26/2014   Hypercholesterolemia without hypertriglyceridemia 10/26/2014   Cigarette smoker 10/26/2014   Depression 04/05/2013   Cardiac tamponade    Aortic dissection (HCC)    HTN (hypertension)    CAD (coronary artery disease)    Hypercholesterolemia    Chronic back pain    Schatzki's ring     Past Surgical History:  Procedure Laterality Date   BACK SURGERY     CABG X 6  07/31/2007   [facility]   CARDIAC CATHETERIZATION     CARDIAC CATHETERIZATION N/A 03/16/2015   Procedure: Coronary/Graft Angiography;  Surgeon: Sherren Mocha, MD;  Location: [place] [facility];  Service: Cardiovascular;  Laterality: N/A;   CARDIAC CATHETERIZATION N/A 03/16/2015   Procedure: Coronary Stent Intervention;  Surgeon: Sherren Mocha, MD;  Location: [place] [facility];  Service: Cardiovascular;  Laterality: N/A;   CORONARY STENT INTERVENTION N/A 09/21/2018   Procedure: CORONARY STENT INTERVENTION;  Surgeon: Jettie Booze, MD;  Location: [place] [facility];  Service: Cardiovascular;  Laterality: N/A;   KNEE SURGERY Right    acl   LEFT HEART CATH AND CORS/GRAFTS ANGIOGRAPHY N/A 09/21/2018   Procedure: LEFT HEART CATH AND CORS/GRAFTS ANGIOGRAPHY;  Surgeon: Jettie Booze, MD;  Location: [place] [facility];  Service: Cardiovascular;  Laterality: N/A;   OLECRANON BURSECTOMY Left 08/27/2012   Procedure: EXCISION LEFT OLECRANON BURSA;  Surgeon: Sanjuana Kava, MD;  Location: [facility];  Service: Orthopedics;  Laterality: Left;   OLECRANON BURSECTOMY Left 05/25/2013   Procedure:  Excision sinus tract and tissue left elbow ;  Surgeon: Sanjuana Kava, MD;  Location: [facility];  Service: Orthopedics;  Laterality: Left;   ORIF SCAPULAR FRACTURE Right    SHOULDER SURGERY Left    STERNAL WIRE [place]  12/31/2007   [facility]        Home Medications    Prior to Admission medications     Medication Sig Start Date End Date Taking? Authorizing Provider  albuterol (PROVENTIL) (2.5 MG/3ML) 0.083% nebulizer solution Take 3 mLs (2.5 mg total) by nebulization every 6 (six) hours as needed for wheezing or shortness of breath. 01/04/18   Barton Dubois, MD  albuterol (VENTOLIN HFA) 108 (90 Base) MCG/ACT inhaler Inhale 2 puffs into the lungs every 6 (six) hours as needed for wheezing or shortness of breath.    [provider]  ALPRAZolam Duanne Moron) 0.25 MG tablet Take 1 tablet (0.25 mg total) by mouth 2 (two) times daily as needed for anxiety. 01/04/18 01/04/19  Barton Dubois, MD  amLODipine (NORVASC) 5 MG tablet Take 5 mg by mouth daily. 08/25/18   [provider]  aspirin EC 81 MG tablet Take 1 tablet (81 mg total) by mouth daily. 09/21/18   Jettie Booze, MD  atorvastatin (LIPITOR) 40 MG tablet Take 1 tablet (40 mg total)  by mouth daily. 09/15/18   Imogene Burn, PA-C  busPIRone (BUSPAR) 7.5 MG tablet Take 1 tablet (7.5 mg total) by mouth 3 (three) times daily. 01/04/18   Barton Dubois, MD  clopidogrel (PLAVIX) 75 MG tablet Take 1 tablet (75 mg total) by mouth daily. 09/21/18   Jettie Booze, MD  feeding supplement, ENSURE ENLIVE, (ENSURE ENLIVE) LIQD Take 237 mLs by mouth 2 (two) times daily between meals. 01/04/18   Barton Dubois, MD  Fluticasone-Umeclidin-Vilant (TRELEGY ELLIPTA) 100-62.5-25 MCG/INH AEPB Inhale 1 puff into the lungs daily.    [provider]  gabapentin (NEURONTIN) 300 MG capsule Take 300 mg by mouth 3 (three) times daily. 12/20/17   [provider]  levothyroxine (SYNTHROID, LEVOTHROID) 50 MCG tablet Take 50 mcg by mouth daily. 03/18/17   [provider]  metoprolol tartrate (LOPRESSOR) 25 MG tablet Take 1 tablet (25 mg total) by mouth 3 (three) times daily. 05/05/18   Isaiah Serge, NP  nitroGLYCERIN (NITROSTAT) 0.4 MG SL tablet Place 1 tablet (0.4 mg total) under the tongue every 5 (five) minutes as needed for  chest pain. 09/15/18   Imogene Burn, PA-C  OXYGEN Inhale 3.5 L into the lungs daily as needed (for shortness of breath). 3-3.5lpm 24/7     [provider]  pantoprazole (PROTONIX) 40 MG tablet Take 1 tablet (40 mg total) by mouth at bedtime. 04/10/15   [name], MD  traMADol (ULTRAM) 50 MG tablet Take 100 mg by mouth 2 (two) times daily.     [provider]  traZODone (DESYREL) 100 MG tablet Take 1 tablet (100 mg total) by mouth at bedtime as needed for sleep. Patient taking differently: Take 100 mg by mouth at bedtime.  01/04/18   Barton Dubois, MD    Family History Family History  Problem Relation Age of Onset   Heart disease Father    Emphysema Paternal Grandmother        never smoked, worked in a Equities trader   Emphysema Paternal Grandfather        never smoked, worked in a Naval architect cancer Paternal Grandfather    Asthma Sister     Social History Social History   Tobacco Use   Smoking status: Former Smoker    Packs/day: 1.00    Years: 20.00    Pack years: 20.00    Types: Cigarettes    Quit date: 12/28/2017    Years since quitting: 0.8   Smokeless tobacco: Former Systems developer    Quit date: 06/05/2015   Tobacco comment: 04/30/16- smoking 1 cig per day//lmr  Substance Use Topics   Alcohol use: No    Alcohol/week: 0.0 standard drinks    Comment: denies   Drug use: No     Allergies   Nsaids, Bee venom, and Codeine   Review of Systems Review of Systems  Constitutional: Positive for activity change, appetite change, chills, fatigue and fever.  HENT: Negative for congestion and rhinorrhea.   Respiratory: Positive for cough, chest tightness and shortness of breath.   Cardiovascular: Positive for chest pain.  Gastrointestinal: Positive for nausea and vomiting. Negative for diarrhea.  Genitourinary: Negative for dysuria and hematuria.  Musculoskeletal: Positive for arthralgias and myalgias.  Skin: Negative for wound.  Neurological:  Positive for dizziness, weakness, light-headedness and headaches.    all other systems are negative except as noted in the HPI and PMH.    Physical Exam Updated Vital Signs BP (!) 144/88 (BP Location:  Right Arm)    Pulse (!) 109    Temp 100.1 F (37.8 C) (Oral)    Resp (!) 30    Ht 5\' 10"  (1.778 m)    Wt 74.8 kg    SpO2 98%    BMI 23.68 kg/m   Physical Exam Vitals signs and nursing note reviewed.  Constitutional:      General: He is in acute distress.     Appearance: He is well-developed.     Comments: Ill-appearing, tachypneic  HENT:     Head: Normocephalic and atraumatic.     Mouth/Throat:     Pharynx: No oropharyngeal exudate.  Eyes:     Conjunctiva/sclera: Conjunctivae normal.     Pupils: Pupils are equal, round, and reactive to light.  Neck:     Musculoskeletal: Normal range of motion and neck supple.     Comments: No meningismus. Cardiovascular:     Rate and Rhythm: Regular rhythm. Tachycardia present.     Heart sounds: Normal heart sounds. No murmur.     Comments: Intact DP and PT pulses bilaterally Pulmonary:     Effort: Pulmonary effort is normal. No respiratory distress.     Breath sounds: Wheezing present.     Comments: Diminished breath sounds with scattered wheezing on the right Abdominal:     Palpations: Abdomen is soft.     Tenderness: There is no abdominal tenderness. There is no guarding or rebound.  Musculoskeletal: Normal range of motion.        General: No tenderness.  Skin:    General: Skin is warm.     Capillary Refill: Capillary refill takes less than 2 seconds.  Neurological:     General: No focal deficit present.     Mental Status: He is alert and oriented to person, place, and time. Mental status is at baseline.     Cranial Nerves: No cranial nerve deficit.     Motor: No abnormal muscle tone.     Coordination: Coordination normal.     Comments: CN 2-12 intact.  5/5 strength throughout, no pronator drift, no ataxia on finger-to-nose, no  appreciable nystagmus  Psychiatric:        Behavior: Behavior normal.      ED Treatments / Results  Labs (all labs ordered are listed, but only abnormal results are displayed) Labs Reviewed  COMPREHENSIVE METABOLIC PANEL - Abnormal; Notable for the following components:      Result Value   Glucose, Bld 103 (*)    Calcium 8.5 (*)    All other components within normal limits  CBC WITH DIFFERENTIAL/PLATELET - Abnormal; Notable for the following components:   WBC 13.8 (*)    RBC 3.99 (*)    MCV 102.3 (*)    Neutro Abs 12.6 (*)    Lymphs Abs 0.3 (*)    All other components within normal limits  URINALYSIS, ROUTINE W REFLEX MICROSCOPIC - Abnormal; Notable for the following components:   Color, Urine STRAW (*)    Hgb urine dipstick SMALL (*)    All other components within normal limits  SARS CORONAVIRUS 2 BY RT PCR (HOSPITAL ORDER, [place] [facility])  CULTURE, BLOOD (ROUTINE X 2)  CULTURE, BLOOD (ROUTINE X 2)  URINE CULTURE  LACTIC ACID, PLASMA  LACTIC ACID, PLASMA  APTT  PROTIME-INR  TROPONIN I (HIGH SENSITIVITY)  TROPONIN I (HIGH SENSITIVITY)    EKG EKG Interpretation  Date/Time:  Monday November 02 2018 23:10:14 EDT Ventricular Rate:  106 PR Interval:  QRS Duration: 93 QT Interval:  339 QTC Calculation: 451 R Axis:   84 Text Interpretation:  Sinus tachycardia Borderline right axis deviation Borderline T abnormalities, lateral leads No significant change was found Confirmed by Ezequiel Essex [phone]) on 11/02/2018 11:24:04 PM   Radiology Ct Head Wo Contrast  Result Date: 11/03/2018 CLINICAL DATA:  Central vertigo, fever of 103.7, wheezing EXAM: CT HEAD WITHOUT CONTRAST TECHNIQUE: Contiguous axial images were obtained from the base of the skull through the vertex without intravenous contrast. COMPARISON:  CT 01/08/2018 FINDINGS: Brain: No evidence of acute infarction, hemorrhage, hydrocephalus, extra-axial collection or mass lesion/mass effect.  Symmetric prominence of the ventricles, cisterns and sulci compatible with parenchymal volume loss. Patchy areas of white matter hypoattenuation are most compatible with chronic microvascular angiopathy. Vascular: Atherosclerotic calcification of the carotid siphons and intradural vertebral arteries. No hyperdense vessel. Skull: No calvarial fracture or suspicious osseous lesion. No scalp swelling or hematoma. Sinuses/Orbits: Paranasal sinuses and mastoid air cells are predominantly clear. Included orbital structures are unremarkable. Other: None IMPRESSION: 1. No acute intracranial abnormality. 2. Generalized parenchymal volume loss and chronic microvascular ischemic white matter disease. Electronically Signed   By: Lovena Le M.D.   On: 11/03/2018 02:31   Dg Chest Port 1 View  Result Date: 11/02/2018 CLINICAL DATA:  Cough, fever EXAM: PORTABLE CHEST 1 VIEW COMPARISON:  CT 02/20/2018, radiograph 01/12/2018 FINDINGS: Portion of the right lung base is collimated from view. Blunting of the left costophrenic sulcus may reflect scarring or trace effusion. The lungs are hyperinflated compatible with emphysematous change seen on prior cross-sectional imaging. Chronic bronchitic changes are again noted. There are numerous surgical clips about the mediastinum compatible with history of prior CABG. Chart notes patient had sternal sutures removed previously. Prominence of the aortic arch corresponds to the type B aortic dissection seen on prior angiography. No new consolidative process. No visible pneumothorax. No acute osseous or soft tissue abnormality. High-riding humeral heads likely reflect chronic rotator cuff insufficiency. IMPRESSION: 1. Trace left pleural effusion versus scarring. No new consolidative process. 2. Emphysematous and chronic bronchitic changes. 3. Prominence of the aortic arch corresponds to the patient's known type B aortic dissection Electronically Signed   By: Lovena Le M.D.   On:  11/02/2018 23:30   Ct Angio Chest/abd/pel For Dissection W And/or Wo Contrast  Result Date: 11/03/2018 CLINICAL DATA:  Chest pain dizziness fever history of dissection EXAM: CT ANGIOGRAPHY CHEST, ABDOMEN AND PELVIS TECHNIQUE: Multidetector CT imaging through the chest, abdomen and pelvis was performed using the standard protocol during bolus administration of intravenous contrast. Multiplanar reconstructed images and MIPs were obtained and reviewed to evaluate the vascular anatomy. CONTRAST:  172mL OMNIPAQUE IOHEXOL 350 MG/ML SOLN COMPARISON:  Chest x-ray 11/02/2018, CT 09/01/2018, 02/20/2018, January 09, 2018, CT chest 01/05/2018 FINDINGS: CTA CHEST FINDINGS Cardiovascular: Non contrasted images of the chest demonstrate moderate aortic atherosclerosis. No acute intramural hematoma. Wall calcification related to known dissection again noted. Following contrast, redemonstrated type B aortic dissection originating just distal to the subclavian takeoff. Ascending aorta demonstrates normal caliber and no dissection flap. Coronary vascular calcifications. Normal heart size. No pericardial effusion. Mostly thrombosed false lumen with persistent saccular enhancement at the distal arch, though without significant change as compared with prior exams. Dilatation of the distal arch up to 4.6 cm maximum, stable as compared with 09/01/2018 and 02/20/2018. tapering of the descending thoracic aorta 2 maximum diameter 3.8 cm as before. No acute mediastinal hematoma or stranding to suggest leakage or rupture. Mediastinum/Nodes: Midline trachea.  No thyroid mass. No significant adenopathy. Esophagus within normal limits Lungs/Pleura: Emphysema. No acute consolidation, pleural effusion, or pneumothorax. Musculoskeletal: Degenerative changes. No acute or suspicious osseous abnormality. Review of the MIP images confirms the above findings. CTA ABDOMEN AND PELVIS FINDINGS VASCULAR Aorta: Chronic dissection to the iliac bifurcation.  Aneurysmal dilatation of the infrarenal abdominal aorta measuring 4.8 x 4.4 cm, previously 4.9 x 4.4 cm. Thrombosed false lumen. Celiac: Arises from true lumen and appears patent. There may be mild narrowing at the origin. SMA: Patent without evidence of aneurysm, dissection, vasculitis or significant stenosis. Renals: Single right and single left renal arteries. Right renal artery is patent. Stenosis of the proximal left renal artery over a 1.2 cm length, similar compared to prior. IMA: Not well seen and probably occluded.  Distal reconstitution. Inflow: Mild to moderate focal stenosis of the right common iliac artery. Moderate disease of the right internal iliac artery. Right external iliac artery is patent. No significant stenosis left common iliac artery. Ectatic distally up to 1.5 cm. No dissection. Moderate disease of the left internal iliac artery. Left external iliac artery is patent. Veins: No obvious venous abnormality within the limitations of this arterial phase study. Review of the MIP images confirms the above findings. NON-VASCULAR Hepatobiliary: No focal liver abnormality is seen. No gallstones, gallbladder wall thickening, or biliary dilatation. Pancreas: Unremarkable. No pancreatic ductal dilatation or surrounding inflammatory changes. Spleen: Normal in size without focal abnormality. Adrenals/Urinary Tract: Adrenal glands are unremarkable. Kidneys are normal, without renal calculi, focal lesion, or hydronephrosis. Bladder is unremarkable. Stomach/Bowel: Stomach is within normal limits. Appendix appears normal. No evidence of bowel wall thickening, distention, or inflammatory changes. Sigmoid colon diverticular disease without acute inflammatory change Lymphatic: No significantly enlarged lymph nodes Reproductive: Prostate is unremarkable. Other: Negative for free air or free fluid Musculoskeletal: Posterior stabilization rods and fixating screws L4 through S1. Interbody devices at L4-L5 and L5-S1.  Review of the MIP images confirms the above findings. IMPRESSION: 1. Stable appearance of known type B aortic dissection with maximum dilatation of the distal aortic arch up to 4.6 cm. Recommend semi-annual imaging followup by CTA or MRA and referral to cardiothoracic surgery if not already obtained. This recommendation follows 2010 ACCF/AHA/AATS/ACR/ASA/SCA/SCAI/SIR/STS/SVM Guidelines for the Diagnosis and Management of Patients With Thoracic Aortic Disease. Circulation. 2010; [id number]:4281962. Aortic aneurysm NOS (ICD10-I71.9) 2. Stable 4.8 cm infrarenal abdominal aortic aneurysm. Recommend followup by abdomen and pelvis CTA in 6 months, and vascular surgery referral/consultation if not already obtained. This recommendation follows ACR consensus guidelines: White Paper of the ACR Incidental Findings Committee II on Vascular Findings. J Am Coll Radiol 2013; 10:789-794. Aortic aneurysm NOS (ICD10-I71.9) 3. Emphysematous disease. Clear lung fields no CT evidence for acute intra-abdominal or pelvic abnormality Electronically Signed   By: Donavan Foil M.D.   On: 11/03/2018 02:54    Procedures Procedures (including critical care time)  Medications Ordered in ED Medications  ceFEPIme (MAXIPIME) 2 g in sodium chloride 0.9 % 100 mL IVPB (has no administration in time range)  metroNIDAZOLE (FLAGYL) IVPB 500 mg (has no administration in time range)  vancomycin (VANCOCIN) IVPB 1000 mg/200 mL premix (has no administration in time range)  sodium chloride 0.9 % bolus 1,000 mL (has no administration in time range)  albuterol (VENTOLIN HFA) 108 (90 Base) MCG/ACT inhaler 6 puff (has no administration in time range)  ondansetron (ZOFRAN) injection 4 mg (has no administration in time range)  vancomycin (VANCOCIN) 500 mg in sodium chloride 0.9 % 100 mL IVPB (  has no administration in time range)     Initial Impression / Assessment and Plan / ED Course  I have reviewed the triage vital signs and the nursing  notes.  Pertinent labs & imaging results that were available during my care of the patient were reviewed by me and considered in my medical decision making (see chart for details).       Patient with shortness of breath, fever, cough and dizziness.  Code sepsis activated on arrival.  EKG shows no acute ischemia.  Sepsis activated on arrival.  Broad-spectrum antibiotic started after cultures are obtained.  Chest x-ray shows no obvious infiltrates.  Labs show leukocytosis.  Hemoglobin improved from previous, lactate is normal.  Urinalysis is normal.  Coronavirus test is negative.  CT head is negative.  Unclear source of patient's sepsis.  UA appears to be clear, chest x-ray appears to be clear.  Suspect likely due to COPD exacerbation.  CT scan obtained and shows stability of his known aortic dissection and aortic abdominal aneurysm. Clear lung fields seen on CT scan.  On recheck patient with tachypnea and wheezing.  He is given additional bronchodilators and steroids.  Suspected sepsis due to COPD exacerbation.  Coronavirus testing is negative.  He remains tachypneic and wheezing.  Will need admission for further breathing treatments and steroids. Did desaturate to 87% on his 3L on exertion.   D/w Dr. Myna Hidalgo.    LEONARD FAGNANI was evaluated in Emergency Department on 11/03/2018 for the symptoms described in the history of present illness. He was evaluated in the context of the global COVID-19 pandemic, which necessitated consideration that the patient might be at risk for infection with the SARS-CoV-2 virus that causes COVID-19. Institutional protocols and algorithms that pertain to the evaluation of patients at risk for COVID-19 are in a state of rapid change based on information released by regulatory bodies including the CDC and federal and state organizations. These policies and algorithms were followed during the patient's care in the ED.  CRITICAL CARE Performed by: Ezequiel Essex Total critical care time: 45 minutes Critical care time was exclusive of separately billable procedures and treating other patients. Critical care was necessary to treat or prevent imminent or life-threatening deterioration. Critical care was time spent personally by me on the following activities: development of treatment plan with patient and/or surrogate as well as nursing, discussions with consultants, evaluation of patient's response to treatment, examination of patient, obtaining history from patient or surrogate, ordering and performing treatments and interventions, ordering and review of laboratory studies, ordering and review of radiographic studies, pulse oximetry and re-evaluation of patient's condition.    Final Clinical Impressions(s) / ED Diagnoses   Final diagnoses:  COPD exacerbation ([place])  Sepsis with acute hypoxic respiratory failure without septic shock, due to unspecified organism [facility])    ED Discharge Orders    None       Ezequiel Essex, MD 11/03/18 [phone]

## 2018-11-02 NOTE — ED Triage Notes (Signed)
Pt brought in by [facility] for c/o of dizziness and fever of 103.7; pt was seen at his PCP today and given meclizine but pt did not get it filled due to unable to drive; pt has wheezing all over; pt given 142ml of normal saline by ems

## 2018-11-03 ENCOUNTER — Encounter (HOSPITAL_COMMUNITY): Payer: Self-pay | Admitting: Family Medicine

## 2018-11-03 ENCOUNTER — Emergency Department (HOSPITAL_COMMUNITY): Payer: Medicare HMO

## 2018-11-03 DIAGNOSIS — Z9981 Dependence on supplemental oxygen: Secondary | ICD-10-CM | POA: Diagnosis not present

## 2018-11-03 DIAGNOSIS — K219 Gastro-esophageal reflux disease without esophagitis: Secondary | ICD-10-CM | POA: Diagnosis present

## 2018-11-03 DIAGNOSIS — R6510 Systemic inflammatory response syndrome (SIRS) of non-infectious origin without acute organ dysfunction: Secondary | ICD-10-CM

## 2018-11-03 DIAGNOSIS — I7101 Dissection of thoracic aorta: Secondary | ICD-10-CM | POA: Diagnosis not present

## 2018-11-03 DIAGNOSIS — I7772 Dissection of iliac artery: Secondary | ICD-10-CM | POA: Diagnosis not present

## 2018-11-03 DIAGNOSIS — J441 Chronic obstructive pulmonary disease with (acute) exacerbation: Secondary | ICD-10-CM | POA: Diagnosis present

## 2018-11-03 DIAGNOSIS — I2510 Atherosclerotic heart disease of native coronary artery without angina pectoris: Secondary | ICD-10-CM | POA: Diagnosis present

## 2018-11-03 DIAGNOSIS — Z800 Family history of malignant neoplasm of digestive organs: Secondary | ICD-10-CM | POA: Diagnosis not present

## 2018-11-03 DIAGNOSIS — R509 Fever, unspecified: Secondary | ICD-10-CM | POA: Diagnosis present

## 2018-11-03 DIAGNOSIS — I10 Essential (primary) hypertension: Secondary | ICD-10-CM | POA: Diagnosis present

## 2018-11-03 DIAGNOSIS — I25118 Atherosclerotic heart disease of native coronary artery with other forms of angina pectoris: Secondary | ICD-10-CM

## 2018-11-03 DIAGNOSIS — J9611 Chronic respiratory failure with hypoxia: Secondary | ICD-10-CM

## 2018-11-03 DIAGNOSIS — Z20828 Contact with and (suspected) exposure to other viral communicable diseases: Secondary | ICD-10-CM | POA: Diagnosis present

## 2018-11-03 DIAGNOSIS — R1312 Dysphagia, oropharyngeal phase: Secondary | ICD-10-CM | POA: Diagnosis present

## 2018-11-03 DIAGNOSIS — Z91030 Bee allergy status: Secondary | ICD-10-CM | POA: Diagnosis not present

## 2018-11-03 DIAGNOSIS — Z885 Allergy status to narcotic agent status: Secondary | ICD-10-CM | POA: Diagnosis not present

## 2018-11-03 DIAGNOSIS — G894 Chronic pain syndrome: Secondary | ICD-10-CM | POA: Diagnosis not present

## 2018-11-03 DIAGNOSIS — Z7982 Long term (current) use of aspirin: Secondary | ICD-10-CM | POA: Diagnosis not present

## 2018-11-03 DIAGNOSIS — I252 Old myocardial infarction: Secondary | ICD-10-CM | POA: Diagnosis not present

## 2018-11-03 DIAGNOSIS — F419 Anxiety disorder, unspecified: Secondary | ICD-10-CM | POA: Diagnosis present

## 2018-11-03 DIAGNOSIS — Z955 Presence of coronary angioplasty implant and graft: Secondary | ICD-10-CM | POA: Diagnosis not present

## 2018-11-03 DIAGNOSIS — Z87891 Personal history of nicotine dependence: Secondary | ICD-10-CM | POA: Diagnosis not present

## 2018-11-03 DIAGNOSIS — I714 Abdominal aortic aneurysm, without rupture, unspecified: Secondary | ICD-10-CM | POA: Diagnosis present

## 2018-11-03 DIAGNOSIS — I7100 Dissection of unspecified site of aorta: Secondary | ICD-10-CM | POA: Diagnosis not present

## 2018-11-03 DIAGNOSIS — Z8249 Family history of ischemic heart disease and other diseases of the circulatory system: Secondary | ICD-10-CM | POA: Diagnosis not present

## 2018-11-03 DIAGNOSIS — E7800 Pure hypercholesterolemia, unspecified: Secondary | ICD-10-CM | POA: Diagnosis present

## 2018-11-03 DIAGNOSIS — Z7902 Long term (current) use of antithrombotics/antiplatelets: Secondary | ICD-10-CM | POA: Diagnosis not present

## 2018-11-03 DIAGNOSIS — Z886 Allergy status to analgesic agent status: Secondary | ICD-10-CM | POA: Diagnosis not present

## 2018-11-03 DIAGNOSIS — R42 Dizziness and giddiness: Secondary | ICD-10-CM | POA: Diagnosis not present

## 2018-11-03 DIAGNOSIS — J9621 Acute and chronic respiratory failure with hypoxia: Secondary | ICD-10-CM | POA: Diagnosis present

## 2018-11-03 DIAGNOSIS — Z79890 Hormone replacement therapy (postmenopausal): Secondary | ICD-10-CM | POA: Diagnosis not present

## 2018-11-03 DIAGNOSIS — Z825 Family history of asthma and other chronic lower respiratory diseases: Secondary | ICD-10-CM | POA: Diagnosis not present

## 2018-11-03 LAB — COMPREHENSIVE METABOLIC PANEL
ALT: 11 U/L (ref 0–44)
AST: 15 U/L (ref 15–41)
Albumin: 4 g/dL (ref 3.5–5.0)
Alkaline Phosphatase: 50 U/L (ref 38–126)
Anion gap: 11 (ref 5–15)
BUN: 11 mg/dL (ref 8–23)
CO2: 28 mmol/L (ref 22–32)
Calcium: 8.5 mg/dL — ABNORMAL LOW (ref 8.9–10.3)
Chloride: 101 mmol/L (ref 98–111)
Creatinine, Ser: 0.83 mg/dL (ref 0.61–1.24)
GFR calc Af Amer: >60 mL/min (ref >60)
GFR calc non Af Amer: >60 mL/min (ref >60)
Glucose, Bld: 103 mg/dL — ABNORMAL HIGH (ref 70–99)
Potassium: 3.6 mmol/L (ref 3.5–5.1)
Sodium: 140 mmol/L (ref 135–145)
Total Bilirubin: 0.5 mg/dL (ref 0.3–1.2)
Total Protein: 6.8 g/dL (ref 6.5–8.1)

## 2018-11-03 LAB — TROPONIN I (HIGH SENSITIVITY)
Troponin I (High Sensitivity): 13 ng/L (ref <18)
Troponin I (High Sensitivity): 15 ng/L (ref <18)

## 2018-11-03 LAB — LACTIC ACID, PLASMA
Lactic Acid, Venous: 1 mmol/L (ref 0.5–1.9)
Lactic Acid, Venous: 1.4 mmol/L (ref 0.5–1.9)

## 2018-11-03 LAB — SARS CORONAVIRUS 2 BY RT PCR (HOSPITAL ORDER, PERFORMED IN ~~LOC~~ HOSPITAL LAB): SARS Coronavirus 2: NEGATIVE

## 2018-11-03 LAB — APTT: aPTT: 30 seconds (ref 24–36)

## 2018-11-03 LAB — GLUCOSE, CAPILLARY: Glucose-Capillary: 113 mg/dL — ABNORMAL HIGH (ref 70–99)

## 2018-11-03 LAB — PROTIME-INR
INR: 1 (ref 0.8–1.2)
Prothrombin Time: 13.2 seconds (ref 11.4–15.2)

## 2018-11-03 LAB — PROCALCITONIN: Procalcitonin: 4.58 ng/mL

## 2018-11-03 MED ORDER — LORATADINE 10 MG PO TABS
10.0000 mg | ORAL_TABLET | Freq: Every day | ORAL | Status: DC
  Administered 2018-11-03 – 2018-11-05 (×3): 10 mg via ORAL
  Filled 2018-11-03 (×3): qty 1

## 2018-11-03 MED ORDER — FLUTICASONE FUROATE-VILANTEROL 100-25 MCG/INH IN AEPB
1.0000 | INHALATION_SPRAY | Freq: Every day | RESPIRATORY_TRACT | Status: DC
  Administered 2018-11-04 – 2018-11-05 (×2): 1 via RESPIRATORY_TRACT
  Filled 2018-11-03: qty 28

## 2018-11-03 MED ORDER — TRAZODONE HCL 50 MG PO TABS
100.0000 mg | ORAL_TABLET | Freq: Every day | ORAL | Status: DC
  Administered 2018-11-03 – 2018-11-04 (×2): 100 mg via ORAL
  Filled 2018-11-03 (×2): qty 2

## 2018-11-03 MED ORDER — ENOXAPARIN SODIUM 40 MG/0.4ML ~~LOC~~ SOLN
40.0000 mg | SUBCUTANEOUS | Status: DC
  Administered 2018-11-03 – 2018-11-05 (×3): 40 mg via SUBCUTANEOUS
  Filled 2018-11-03 (×3): qty 0.4

## 2018-11-03 MED ORDER — ALBUTEROL SULFATE (2.5 MG/3ML) 0.083% IN NEBU
2.5000 mg | INHALATION_SOLUTION | RESPIRATORY_TRACT | Status: DC | PRN
  Administered 2018-11-05: 2.5 mg via RESPIRATORY_TRACT
  Filled 2018-11-03: qty 3

## 2018-11-03 MED ORDER — IOHEXOL 350 MG/ML SOLN
100.0000 mL | Freq: Once | INTRAVENOUS | Status: AC | PRN
  Administered 2018-11-03: 100 mL via INTRAVENOUS

## 2018-11-03 MED ORDER — SODIUM CHLORIDE 0.9 % IV SOLN: 250.0000 mL | INTRAVENOUS | Status: DC | PRN

## 2018-11-03 MED ORDER — METOPROLOL TARTRATE 25 MG PO TABS
25.0000 mg | ORAL_TABLET | Freq: Three times a day (TID) | ORAL | Status: DC
  Administered 2018-11-03 – 2018-11-05 (×8): 25 mg via ORAL
  Filled 2018-11-03 (×8): qty 1

## 2018-11-03 MED ORDER — PANTOPRAZOLE SODIUM 40 MG PO TBEC
40.0000 mg | DELAYED_RELEASE_TABLET | Freq: Every day | ORAL | Status: DC
  Administered 2018-11-03 – 2018-11-04 (×2): 40 mg via ORAL
  Filled 2018-11-03 (×2): qty 1

## 2018-11-03 MED ORDER — PANTOPRAZOLE SODIUM 40 MG IV SOLR
40.0000 mg | Freq: Once | INTRAVENOUS | Status: AC
  Administered 2018-11-03: 40 mg via INTRAVENOUS
  Filled 2018-11-03: qty 40

## 2018-11-03 MED ORDER — IPRATROPIUM-ALBUTEROL 0.5-2.5 (3) MG/3ML IN SOLN
3.0000 mL | Freq: Four times a day (QID) | RESPIRATORY_TRACT | Status: DC
  Administered 2018-11-03 – 2018-11-05 (×10): 3 mL via RESPIRATORY_TRACT
  Filled 2018-11-03 (×10): qty 3

## 2018-11-03 MED ORDER — FLUTICASONE-UMECLIDIN-VILANT 100-62.5-25 MCG/INH IN AEPB: 1.0000 | INHALATION_SPRAY | Freq: Every day | RESPIRATORY_TRACT | Status: DC

## 2018-11-03 MED ORDER — VANCOMYCIN HCL 500 MG IV SOLR
INTRAVENOUS | Status: AC
  Filled 2018-11-03: qty 500

## 2018-11-03 MED ORDER — ALBUTEROL (5 MG/ML) CONTINUOUS INHALATION SOLN
10.0000 mg/h | INHALATION_SOLUTION | Freq: Once | RESPIRATORY_TRACT | Status: AC
  Administered 2018-11-03: 10 mg/h via RESPIRATORY_TRACT
  Filled 2018-11-03: qty 20

## 2018-11-03 MED ORDER — SODIUM CHLORIDE 0.9% FLUSH
3.0000 mL | Freq: Two times a day (BID) | INTRAVENOUS | Status: DC
  Administered 2018-11-03 – 2018-11-05 (×5): 3 mL via INTRAVENOUS

## 2018-11-03 MED ORDER — CLOPIDOGREL BISULFATE 75 MG PO TABS
75.0000 mg | ORAL_TABLET | Freq: Every day | ORAL | Status: DC
  Administered 2018-11-03 – 2018-11-05 (×3): 75 mg via ORAL
  Filled 2018-11-03 (×3): qty 1

## 2018-11-03 MED ORDER — MAGNESIUM SULFATE 2 GM/50ML IV SOLN
2.0000 g | Freq: Once | INTRAVENOUS | Status: AC
  Administered 2018-11-03: 2 g via INTRAVENOUS
  Filled 2018-11-03: qty 50

## 2018-11-03 MED ORDER — IPRATROPIUM-ALBUTEROL 0.5-2.5 (3) MG/3ML IN SOLN
3.0000 mL | Freq: Once | RESPIRATORY_TRACT | Status: AC
  Administered 2018-11-03: 3 mL via RESPIRATORY_TRACT
  Filled 2018-11-03: qty 3

## 2018-11-03 MED ORDER — BENZONATATE 100 MG PO CAPS
200.0000 mg | ORAL_CAPSULE | Freq: Three times a day (TID) | ORAL | Status: DC
  Administered 2018-11-03 – 2018-11-05 (×6): 200 mg via ORAL
  Filled 2018-11-03 (×6): qty 2

## 2018-11-03 MED ORDER — BUSPIRONE HCL 5 MG PO TABS
7.5000 mg | ORAL_TABLET | Freq: Three times a day (TID) | ORAL | Status: DC
  Administered 2018-11-03 – 2018-11-05 (×8): 7.5 mg via ORAL
  Filled 2018-11-03 (×9): qty 2

## 2018-11-03 MED ORDER — SODIUM CHLORIDE 0.9 % IV SOLN
2.0000 g | Freq: Three times a day (TID) | INTRAVENOUS | Status: DC
  Administered 2018-11-03: 2 g via INTRAVENOUS
  Filled 2018-11-03 (×2): qty 2

## 2018-11-03 MED ORDER — ASPIRIN EC 81 MG PO TBEC
81.0000 mg | DELAYED_RELEASE_TABLET | Freq: Every day | ORAL | Status: DC
  Administered 2018-11-03 – 2018-11-05 (×3): 81 mg via ORAL
  Filled 2018-11-03 (×3): qty 1

## 2018-11-03 MED ORDER — GUAIFENESIN-DM 100-10 MG/5ML PO SYRP
10.0000 mL | ORAL_SOLUTION | ORAL | Status: DC | PRN
  Administered 2018-11-03 (×3): 10 mL via ORAL
  Filled 2018-11-03 (×4): qty 10

## 2018-11-03 MED ORDER — ENSURE ENLIVE PO LIQD
237.0000 mL | Freq: Two times a day (BID) | ORAL | Status: DC
  Administered 2018-11-03 – 2018-11-05 (×5): 237 mL via ORAL

## 2018-11-03 MED ORDER — ATORVASTATIN CALCIUM 40 MG PO TABS
40.0000 mg | ORAL_TABLET | Freq: Every day | ORAL | Status: DC
  Administered 2018-11-03 – 2018-11-05 (×3): 40 mg via ORAL
  Filled 2018-11-03 (×4): qty 1

## 2018-11-03 MED ORDER — BENZONATATE 100 MG PO CAPS
100.0000 mg | ORAL_CAPSULE | Freq: Once | ORAL | Status: AC
  Administered 2018-11-03: 100 mg via ORAL
  Filled 2018-11-03: qty 1

## 2018-11-03 MED ORDER — SODIUM CHLORIDE 0.9 % IV BOLUS
1000.0000 mL | Freq: Once | INTRAVENOUS | Status: AC
  Administered 2018-11-03: 1000 mL via INTRAVENOUS

## 2018-11-03 MED ORDER — SODIUM CHLORIDE 0.9% FLUSH: 3.0000 mL | INTRAVENOUS | Status: DC | PRN

## 2018-11-03 MED ORDER — SENNOSIDES-DOCUSATE SODIUM 8.6-50 MG PO TABS: 1.0000 | ORAL_TABLET | Freq: Every evening | ORAL | Status: DC | PRN

## 2018-11-03 MED ORDER — METHYLPREDNISOLONE SODIUM SUCC 125 MG IJ SOLR
60.0000 mg | Freq: Once | INTRAMUSCULAR | Status: AC
  Administered 2018-11-03: 60 mg via INTRAVENOUS
  Filled 2018-11-03: qty 2

## 2018-11-03 MED ORDER — TRAMADOL HCL 50 MG PO TABS
100.0000 mg | ORAL_TABLET | Freq: Two times a day (BID) | ORAL | Status: DC | PRN
  Administered 2018-11-03 – 2018-11-04 (×2): 100 mg via ORAL
  Filled 2018-11-03 (×2): qty 2

## 2018-11-03 MED ORDER — TRAMADOL HCL 50 MG PO TABS
100.0000 mg | ORAL_TABLET | Freq: Once | ORAL | Status: AC
  Administered 2018-11-03: 100 mg via ORAL
  Filled 2018-11-03: qty 2

## 2018-11-03 MED ORDER — GABAPENTIN 300 MG PO CAPS
300.0000 mg | ORAL_CAPSULE | Freq: Three times a day (TID) | ORAL | Status: DC
  Administered 2018-11-03 – 2018-11-05 (×8): 300 mg via ORAL
  Filled 2018-11-03 (×8): qty 1

## 2018-11-03 MED ORDER — METHYLPREDNISOLONE SODIUM SUCC 125 MG IJ SOLR
60.0000 mg | Freq: Four times a day (QID) | INTRAMUSCULAR | Status: DC
  Administered 2018-11-03 – 2018-11-05 (×10): 60 mg via INTRAVENOUS
  Filled 2018-11-03 (×10): qty 2

## 2018-11-03 MED ORDER — ONDANSETRON HCL 4 MG PO TABS: 4.0000 mg | ORAL_TABLET | Freq: Four times a day (QID) | ORAL | Status: DC | PRN

## 2018-11-03 MED ORDER — VANCOMYCIN HCL 10 G IV SOLR
1750.0000 mg | INTRAVENOUS | Status: DC
  Filled 2018-11-03: qty 1750

## 2018-11-03 MED ORDER — BUDESONIDE 0.5 MG/2ML IN SUSP
0.5000 mg | Freq: Two times a day (BID) | RESPIRATORY_TRACT | Status: DC
  Administered 2018-11-03 – 2018-11-05 (×5): 0.5 mg via RESPIRATORY_TRACT
  Filled 2018-11-03 (×5): qty 2

## 2018-11-03 MED ORDER — ACETAMINOPHEN 325 MG PO TABS
650.0000 mg | ORAL_TABLET | Freq: Four times a day (QID) | ORAL | Status: DC | PRN
  Administered 2018-11-03 – 2018-11-04 (×3): 650 mg via ORAL
  Filled 2018-11-03 (×3): qty 2

## 2018-11-03 MED ORDER — UMECLIDINIUM BROMIDE 62.5 MCG/INH IN AEPB
1.0000 | INHALATION_SPRAY | Freq: Every day | RESPIRATORY_TRACT | Status: DC
  Administered 2018-11-04 – 2018-11-05 (×2): 1 via RESPIRATORY_TRACT
  Filled 2018-11-03: qty 7

## 2018-11-03 MED ORDER — ONDANSETRON HCL 4 MG/2ML IJ SOLN: 4.0000 mg | Freq: Four times a day (QID) | INTRAMUSCULAR | Status: DC | PRN

## 2018-11-03 MED ORDER — LEVOTHYROXINE SODIUM 50 MCG PO TABS
50.0000 ug | ORAL_TABLET | Freq: Every day | ORAL | Status: DC
  Administered 2018-11-03 – 2018-11-05 (×3): 50 ug via ORAL
  Filled 2018-11-03 (×3): qty 1

## 2018-11-03 MED ORDER — IPRATROPIUM BROMIDE 0.02 % IN SOLN
0.5000 mg | Freq: Once | RESPIRATORY_TRACT | Status: AC
  Administered 2018-11-03: 0.5 mg via RESPIRATORY_TRACT
  Filled 2018-11-03: qty 2.5

## 2018-11-03 MED ORDER — ACETAMINOPHEN 650 MG RE SUPP: 650.0000 mg | Freq: Four times a day (QID) | RECTAL | Status: DC | PRN

## 2018-11-03 MED ORDER — SODIUM CHLORIDE 0.9 % IV SOLN
500.0000 mg | INTRAVENOUS | Status: DC
  Administered 2018-11-03: 500 mg via INTRAVENOUS
  Filled 2018-11-03: qty 500

## 2018-11-03 MED ORDER — IPRATROPIUM BROMIDE 0.03 % NA SOLN
2.0000 | Freq: Two times a day (BID) | NASAL | Status: DC
  Administered 2018-11-03 – 2018-11-05 (×3): 2 via NASAL
  Filled 2018-11-03: qty 30

## 2018-11-03 MED ORDER — ALPRAZOLAM 0.25 MG PO TABS
0.2500 mg | ORAL_TABLET | Freq: Two times a day (BID) | ORAL | Status: DC | PRN
  Administered 2018-11-03 – 2018-11-05 (×5): 0.25 mg via ORAL
  Filled 2018-11-03 (×5): qty 1

## 2018-11-03 NOTE — TOC Initial Note (Signed)
Transition of Care [facility]) - Initial/Assessment Note    Patient Details  Name: [name] MRN: [id number] Date of Birth: December 20, 1950  Transition of Care [facility]) CM/SW Contact:    Boneta Lucks, RN Phone Number: 11/03/2018, 1:07 PM  Clinical Narrative:        Patient admitted for COPD and has a high risk of readmission. Patient lives with his wife and drives himself to appointments. States he does buy his medications but they are expensive.  He used Home oxygen at 3.5L. Has no other DME's in the home and does not need any at this time. Per MD patient will be here a couple more days. TOC to follow.          Expected Discharge Plan: Home/Self Care Barriers to Discharge: Continued Medical Work up   Patient Goals and CMS Choice Patient states their goals for this hospitalization and ongoing recovery are:: to go home.      Expected Discharge Plan and Services Expected Discharge Plan: Home/Self Care    Prior Living Arrangements/Services   Lives with:: Spouse   Do you feel safe going back to the place where you live?: Yes      Need for Family Participation in Patient Care: Yes (Comment) Care giver support system in place?: Yes (comment) Current home services: Other (comment)(Home oxygen) Criminal Activity/Legal Involvement Pertinent to Current Situation/Hospitalization: No - Comment as needed  Emotional Assessment     Affect (typically observed): Accepting Orientation: : Oriented to Self, Oriented to Place, Oriented to  Time, Oriented to Situation Alcohol / Substance Use: Not Applicable Psych Involvement: No (comment)  Admission diagnosis:  COPD exacerbation ([place]) [J44.1] Sepsis with acute hypoxic respiratory failure without septic shock, due to unspecified organism ([place]) [A41.9, R65.20, J96.01] Patient Active Problem List   Diagnosis Date Noted  . COPD with acute exacerbation ([place]) 11/03/2018  . AAA (abdominal aortic aneurysm) without rupture ([place]) 11/03/2018  . Other chest  pain   . Anxiety 01/06/2018  . Ischemic chest pain ([place]) 01/06/2018  . SIRS (systemic inflammatory response syndrome) ([facility])   . Acute on chronic respiratory failure with hypoxia ([place]) 12/31/2017  . Insomnia secondary to depression with anxiety 12/31/2017  . Chronic respiratory failure with hypoxia ([place]) 05/01/2016  . Dyspnea on exertion 04/30/2016  . Hypokalemia 11/17/2015  . Hyperglycemia 11/17/2015  . COPD exacerbation ([place]) 08/03/2015  . Orthostatic hypotension   . Acute kidney injury ([place])   . Altered mental status 05/11/2015  . AKI (acute kidney injury) ([place]) 05/11/2015  . Decubitus ulcer of back, stage 2 ([place]) 05/11/2015  . GERD without esophagitis 05/11/2015  . Moderate protein malnutrition ([place]) 05/11/2015  . Disorientation 05/11/2015  . Malnutrition of moderate degree 04/04/2015  . Acute on chronic respiratory failure ([place])   . Aspiration into airway   . Descending thoracic dissection ([place])   . HCAP (healthcare-associated pneumonia)   . Lactic acidosis   . Tylenol toxicity   . Faintness   . NSTEMI (non-ST elevated myocardial infarction) ([place])   . Elevated troponin 03/14/2015  . Acute myocardial infarction, subendocardial infarction, subsequent episode of care ([place]) 10/26/2014  . Angina pectoris ([place]) 10/26/2014  . COPD clinical dx/ could not perforom spirometry  10/26/2014  . Arteriosclerosis of autologous vein coronary artery bypass graft 10/26/2014  . Arteriosclerosis of coronary artery 10/26/2014  . H/O adenomatous polyp of colon 10/26/2014  . Hypercholesterolemia without hypertriglyceridemia 10/26/2014  . Cigarette smoker 10/26/2014  . Depression 04/05/2013  . Cardiac tamponade   . Aortic dissection (  [place])   . HTN (hypertension)   . CAD (coronary artery disease)   . Hypercholesterolemia   . Chronic back pain   . Schatzki's ring    PCP:  Wenda Low, MD Pharmacy:   [facility] [address] [place] [address] [place]  [address] Phone: [phone] Fax: [phone]  [facility] [place] [place] - [address] [place] [address] Phone: [phone] Fax: [phone]     Social Determinants of Health (SDOH) Interventions    Readmission Risk Interventions Readmission Risk Prevention Plan 11/03/2018  Transportation Screening Complete  PCP or Specialist Appt within 3-5 Days Not Complete  HRI or [place] Complete  Social Work Consult for [place] Planning/Counseling [place] Not Complete  Medication Review Press photographer) Complete  Some recent data might be hidden

## 2018-11-03 NOTE — H&P (Signed)
History and Physical    [name] DOB: 10-18-1950 DOA: 11/02/2018  PCP: Wenda Low, MD   Patient coming from: Home   Chief Complaint: Cough, SOB, vomiting   HPI: [name] is a 68 y.o. male with medical history significant for COPD with chronic hypoxic respiratory failure, coronary artery disease with stents, type B aortic dissection and infrarenal AAA followed by vascular surgery, hypothyroidism, anxiety, and hypertension, now presenting to the emergency department for evaluation of fevers, shortness of breath, cough, posttussive emesis, and lightheadedness.  Patient reports rhinorrhea, increased cough with increased sputum production, and worsening shortness of breath over the past few days.  He reports fever to 103 F beginning yesterday.  He denies any chest pain associated with this and denies any lower extremity swelling or tenderness.  Denies dysuria or flank pain.  Denies abdominal pain.  Denies sick contacts.  ED Course: Upon arrival to the ED, patient is found to be afebrile, saturating mid 90s on 3 L/min of supplemental oxygen, tachypneic to 30, tachycardic in the 0000000, and with systolic blood pressure [id number].  EKG features sinus tachycardia with rate 106 and noncontrast head CT is negative for acute intracranial abnormality.  CTA chest/abdomen/pelvis is notable for emphysematous lung disease, stable type B aortic dissection, stable infrarenal AAA, and no acute findings.  Chemistry panel is unremarkable and CBC notable for leukocytosis to 13,800.  Lactic acid is reassuringly normal.  COVID-19 is negative.  Blood cultures were collected in the ED and the patient was treated with magnesium, albuterol, 2 L normal saline, IV Solu-Medrol, vancomycin, cefepime, and Flagyl.  Review of Systems:  All other systems reviewed and apart from HPI, are negative.  Past Medical History:  Diagnosis Date   Anemia    Anxiety    Aortic dissection (HCC)    TYPE 3   CAD (coronary  artery disease)    multiple, most recently Jan-March 2017   Cardiac tamponade    Chronic bronchitis (HCC)    Chronic fatigue    Chronic pain    Constipation    COPD (chronic obstructive pulmonary disease) (HCC)    Depression    Dysphagia, oropharyngeal phase    ETOH abuse    GERD (gastroesophageal reflux disease)    Hiatal hernia    HTN (hypertension)    Hypercholesterolemia    Metabolic encephalopathy    Opioid abuse ([place])    Peptic ulcer disease 08/2010   EGD    PNA (pneumonia)    Schatzki's ring     Past Surgical History:  Procedure Laterality Date   BACK SURGERY     CABG X 6  07/31/2007   [facility]   CARDIAC CATHETERIZATION     CARDIAC CATHETERIZATION N/A 03/16/2015   Procedure: Coronary/Graft Angiography;  Surgeon: Sherren Mocha, MD;  Location: [facility];  Service: Cardiovascular;  Laterality: N/A;   CARDIAC CATHETERIZATION N/A 03/16/2015   Procedure: Coronary Stent Intervention;  Surgeon: Sherren Mocha, MD;  Location: [facility];  Service: Cardiovascular;  Laterality: N/A;   CORONARY STENT INTERVENTION N/A 09/21/2018   Procedure: CORONARY STENT INTERVENTION;  Surgeon: Jettie Booze, MD;  Location: [place] [facility];  Service: Cardiovascular;  Laterality: N/A;   KNEE SURGERY Right    acl   LEFT HEART CATH AND CORS/GRAFTS ANGIOGRAPHY N/A 09/21/2018   Procedure: LEFT HEART CATH AND CORS/GRAFTS ANGIOGRAPHY;  Surgeon: Jettie Booze, MD;  Location: [facility];  Service: Cardiovascular;  Laterality: N/A;   OLECRANON  BURSECTOMY Left 08/27/2012   Procedure: EXCISION LEFT OLECRANON BURSA;  Surgeon: Sanjuana Kava, MD;  Location: [facility];  Service: Orthopedics;  Laterality: Left;   OLECRANON BURSECTOMY Left 05/25/2013   Procedure:  Excision sinus tract and tissue left elbow ;  Surgeon: Sanjuana Kava, MD;  Location: [facility];  Service: Orthopedics;  Laterality: Left;   ORIF SCAPULAR FRACTURE Right    SHOULDER SURGERY  Left    STERNAL WIRE REMOVEAL  12/31/2007   [facility]     reports that he quit smoking about 10 months ago. His smoking use included cigarettes. He has a 20.00 pack-year smoking history. He quit smokeless tobacco use about 3 years ago. He reports that he does not drink alcohol or use drugs.  Allergies  Allergen Reactions   Nsaids Shortness Of Breath   Bee Venom Swelling    Neck swelling, passes out   Codeine Other (See Comments)    Causes GI Upset    Family History  Problem Relation Age of Onset   Heart disease Father    Emphysema Paternal Grandmother        never smoked, worked in a Equities trader   Emphysema Paternal Grandfather        never smoked, worked in a Naval architect cancer Paternal Grandfather    Asthma Sister      Prior to Admission medications   Medication Sig Start Date End Date Taking? Authorizing Provider  albuterol (PROVENTIL) (2.5 MG/3ML) 0.083% nebulizer solution Take 3 mLs (2.5 mg total) by nebulization every 6 (six) hours as needed for wheezing or shortness of breath. 01/04/18   Barton Dubois, MD  albuterol (VENTOLIN HFA) 108 (90 Base) MCG/ACT inhaler Inhale 2 puffs into the lungs every 6 (six) hours as needed for wheezing or shortness of breath.    [provider]  ALPRAZolam Duanne Moron) 0.25 MG tablet Take 1 tablet (0.25 mg total) by mouth 2 (two) times daily as needed for anxiety. 01/04/18 01/04/19  Barton Dubois, MD  amLODipine (NORVASC) 5 MG tablet Take 5 mg by mouth daily. 08/25/18   [provider]  aspirin EC 81 MG tablet Take 1 tablet (81 mg total) by mouth daily. 09/21/18   Jettie Booze, MD  atorvastatin (LIPITOR) 40 MG tablet Take 1 tablet (40 mg total) by mouth daily. 09/15/18   Imogene Burn, PA-C  busPIRone (BUSPAR) 7.5 MG tablet Take 1 tablet (7.5 mg total) by mouth 3 (three) times daily. 01/04/18   Barton Dubois, MD  clopidogrel (PLAVIX) 75 MG tablet Take 1 tablet (75 mg total) by mouth daily. 09/21/18    Jettie Booze, MD  feeding supplement, ENSURE ENLIVE, (ENSURE ENLIVE) LIQD Take 237 mLs by mouth 2 (two) times daily between meals. 01/04/18   Barton Dubois, MD  Fluticasone-Umeclidin-Vilant (TRELEGY ELLIPTA) 100-62.5-25 MCG/INH AEPB Inhale 1 puff into the lungs daily.    [provider]  gabapentin (NEURONTIN) 300 MG capsule Take 300 mg by mouth 3 (three) times daily. 12/20/17   [provider]  levothyroxine (SYNTHROID, LEVOTHROID) 50 MCG tablet Take 50 mcg by mouth daily. 03/18/17   [provider]  metoprolol tartrate (LOPRESSOR) 25 MG tablet Take 1 tablet (25 mg total) by mouth 3 (three) times daily. 05/05/18   Isaiah Serge, NP  nitroGLYCERIN (NITROSTAT) 0.4 MG SL tablet Place 1 tablet (0.4 mg total) under the tongue every 5 (five) minutes as needed for chest pain. 09/15/18   Imogene Burn, PA-C  OXYGEN Inhale 3.5  L into the lungs daily as needed (for shortness of breath). 3-3.5lpm 24/7     [provider]  pantoprazole (PROTONIX) 40 MG tablet Take 1 tablet (40 mg total) by mouth at bedtime. 04/10/15   [name], MD  traMADol (ULTRAM) 50 MG tablet Take 100 mg by mouth 2 (two) times daily.     [provider]  traZODone (DESYREL) 100 MG tablet Take 1 tablet (100 mg total) by mouth at bedtime as needed for sleep. Patient taking differently: Take 100 mg by mouth at bedtime.  01/04/18   Barton Dubois, MD    Physical Exam: Vitals:   11/03/18 0200 11/03/18 0230 11/03/18 0300 11/03/18 0337  BP: 127/72 115/75 107/74   Pulse: (!) 111 100 98   Resp: 17 14 (!) 22   Temp:      TempSrc:      SpO2: 97% 100% 98% 94%  Weight:      Height:        Constitutional: Tachypneic, no pallor, no diaphoresis  Eyes: PERTLA, lids and conjunctivae normal ENMT: Mucous membranes are moist. Posterior pharynx clear of any exudate or lesions.   Neck: normal, supple, no masses, no thyromegaly Respiratory: Diminished breath sounds bilaterally. Increased  WOB.  No pallor or cyanosis.  Cardiovascular: rate ~110 and regular. No extremity edema.    Abdomen: No distension, no tenderness, soft. Bowel sounds active.  Musculoskeletal: no clubbing / cyanosis. No joint deformity upper and lower extremities.     Skin: no significant rashes, lesions, ulcers. Warm, dry, well-perfused. Neurologic: No facial asymmetry. Sensation intact. Moving all extremities.  Psychiatric:  Alert and oriented x 3. Pleasant, cooperative.      Labs on Admission: I have personally reviewed following labs and imaging studies  CBC: Recent Labs  Lab 11/02/18 2334  WBC 13.8*  NEUTROABS 12.6*  HGB 13.2  HCT 40.8  MCV 102.3*  PLT [id number]   Basic Metabolic Panel: Recent Labs  Lab 11/02/18 2334  NA 140  K 3.6  CL 101  CO2 28  GLUCOSE 103*  BUN 11  CREATININE 0.83  CALCIUM 8.5*   GFR: Estimated Creatinine Clearance: 89.2 mL/min (by C-G formula based on SCr of 0.83 mg/dL). Liver Function Tests: Recent Labs  Lab 11/02/18 2334  AST 15  ALT 11  ALKPHOS 50  BILITOT 0.5  PROT 6.8  ALBUMIN 4.0   No results for input(s): LIPASE, AMYLASE in the last 168 hours. No results for input(s): AMMONIA in the last 168 hours. Coagulation Profile: Recent Labs  Lab 11/02/18 2334  INR 1.0   Cardiac Enzymes: No results for input(s): CKTOTAL, CKMB, CKMBINDEX, TROPONINI in the last 168 hours. BNP (last 3 results) No results for input(s): PROBNP in the last 8760 hours. HbA1C: No results for input(s): HGBA1C in the last 72 hours. CBG: No results for input(s): GLUCAP in the last 168 hours. Lipid Profile: No results for input(s): CHOL, HDL, LDLCALC, TRIG, CHOLHDL, LDLDIRECT in the last 72 hours. Thyroid Function Tests: No results for input(s): TSH, T4TOTAL, FREET4, T3FREE, THYROIDAB in the last 72 hours. Anemia Panel: No results for input(s): VITAMINB12, FOLATE, FERRITIN, TIBC, IRON, RETICCTPCT in the last 72 hours. Urine analysis:    Component Value Date/Time    COLORURINE STRAW (A) 11/02/2018 2319   APPEARANCEUR CLEAR 11/02/2018 2319   LABSPEC 1.009 11/02/2018 2319   PHURINE 5.0 11/02/2018 2319   GLUCOSEU NEGATIVE 11/02/2018 2319   HGBUR SMALL (A) 11/02/2018 [place] 11/02/2018 2319   KETONESUR  NEGATIVE 11/02/2018 2319   PROTEINUR NEGATIVE 11/02/2018 2319   UROBILINOGEN 0.2 08/26/2012 0930   NITRITE NEGATIVE 11/02/2018 2319   LEUKOCYTESUR NEGATIVE 11/02/2018 2319   Sepsis Labs: @LABRCNTIP (procalcitonin:4,lacticidven:4) ) Recent Results (from the past 240 hour(s))  SARS Coronavirus 2 by RT PCR (hospital order, performed in [facility] hospital lab) Nasopharyngeal Nasopharyngeal Swab     Status: None   Collection Time: 11/02/18 11:07 PM   Specimen: Nasopharyngeal Swab  Result Value Ref Range Status   SARS Coronavirus 2 NEGATIVE NEGATIVE Final    Comment: (NOTE) If result is NEGATIVE SARS-CoV-2 target nucleic acids are NOT DETECTED. The SARS-CoV-2 RNA is generally detectable in upper and lower  respiratory specimens during the acute phase of infection. The lowest  concentration of SARS-CoV-2 viral copies this assay can detect is 250  copies / mL. A negative result does not preclude SARS-CoV-2 infection  and should not be used as the sole basis for treatment or other  patient management decisions.  A negative result may occur with  improper specimen collection / handling, submission of specimen other  than nasopharyngeal swab, presence of viral mutation(s) within the  areas targeted by this assay, and inadequate number of viral copies  (<250 copies / mL). A negative result must be combined with clinical  observations, patient history, and epidemiological information. If result is POSITIVE SARS-CoV-2 target nucleic acids are DETECTED. The SARS-CoV-2 RNA is generally detectable in upper and lower  respiratory specimens dur ing the acute phase of infection.  Positive  results are indicative of active infection with  SARS-CoV-2.  Clinical  correlation with patient history and other diagnostic information is  necessary to determine patient infection status.  Positive results do  not rule out bacterial infection or co-infection with other viruses. If result is PRESUMPTIVE POSTIVE SARS-CoV-2 nucleic acids MAY BE PRESENT.   A presumptive positive result was obtained on the submitted specimen  and confirmed on repeat testing.  While 2019 novel coronavirus  (SARS-CoV-2) nucleic acids may be present in the submitted sample  additional confirmatory testing may be necessary for epidemiological  and / or clinical management purposes  to differentiate between  SARS-CoV-2 and other Sarbecovirus currently known to infect humans.  If clinically indicated additional testing with an alternate test  methodology [phone]) is advised. The SARS-CoV-2 RNA is generally  detectable in upper and lower respiratory sp ecimens during the acute  phase of infection. The expected result is Negative. Fact Sheet for Patients:  StrictlyIdeas.no Fact Sheet for Healthcare Providers: BankingDealers.co.za This test is not yet approved or cleared by the [place] FDA and has been authorized for detection and/or diagnosis of SARS-CoV-2 by FDA under an Emergency Use Authorization (EUA).  This EUA will remain in effect (meaning this test can be used) for the duration of the COVID-19 declaration under Section 564(b)(1) of the Act, 21 U.S.C. section 360bbb-3(b)(1), unless the authorization is terminated or revoked sooner. Performed at [facility], [address] [place], [place] [address]      Radiological Exams on Admission: Ct Head Wo Contrast  Result Date: 11/03/2018 CLINICAL DATA:  Central vertigo, fever of 103.7, wheezing EXAM: CT HEAD WITHOUT CONTRAST TECHNIQUE: Contiguous axial images were obtained from the base of the skull through the vertex without intravenous contrast.  COMPARISON:  CT 01/08/2018 FINDINGS: Brain: No evidence of acute infarction, hemorrhage, hydrocephalus, extra-axial collection or mass lesion/mass effect. Symmetric prominence of the ventricles, cisterns and sulci compatible with parenchymal volume loss. Patchy areas of white matter hypoattenuation  are most compatible with chronic microvascular angiopathy. Vascular: Atherosclerotic calcification of the carotid siphons and intradural vertebral arteries. No hyperdense vessel. Skull: No calvarial fracture or suspicious osseous lesion. No scalp swelling or hematoma. Sinuses/Orbits: Paranasal sinuses and mastoid air cells are predominantly clear. Included orbital structures are unremarkable. Other: None IMPRESSION: 1. No acute intracranial abnormality. 2. Generalized parenchymal volume loss and chronic microvascular ischemic white matter disease. Electronically Signed   By: Lovena Le M.D.   On: 11/03/2018 02:31   Dg Chest Port 1 View  Result Date: 11/02/2018 CLINICAL DATA:  Cough, fever EXAM: PORTABLE CHEST 1 VIEW COMPARISON:  CT 02/20/2018, radiograph 01/12/2018 FINDINGS: Portion of the right lung base is collimated from view. Blunting of the left costophrenic sulcus may reflect scarring or trace effusion. The lungs are hyperinflated compatible with emphysematous change seen on prior cross-sectional imaging. Chronic bronchitic changes are again noted. There are numerous surgical clips about the mediastinum compatible with history of prior CABG. Chart notes patient had sternal sutures removed previously. Prominence of the aortic arch corresponds to the type B aortic dissection seen on prior angiography. No new consolidative process. No visible pneumothorax. No acute osseous or soft tissue abnormality. High-riding humeral heads likely reflect chronic rotator cuff insufficiency. IMPRESSION: 1. Trace left pleural effusion versus scarring. No new consolidative process. 2. Emphysematous and chronic bronchitic changes.  3. Prominence of the aortic arch corresponds to the patient's known type B aortic dissection Electronically Signed   By: Lovena Le M.D.   On: 11/02/2018 23:30   Ct Angio Chest/abd/pel For Dissection W And/or Wo Contrast  Result Date: 11/03/2018 CLINICAL DATA:  Chest pain dizziness fever history of dissection EXAM: CT ANGIOGRAPHY CHEST, ABDOMEN AND PELVIS TECHNIQUE: Multidetector CT imaging through the chest, abdomen and pelvis was performed using the standard protocol during bolus administration of intravenous contrast. Multiplanar reconstructed images and MIPs were obtained and reviewed to evaluate the vascular anatomy. CONTRAST:  175mL OMNIPAQUE IOHEXOL 350 MG/ML SOLN COMPARISON:  Chest x-ray 11/02/2018, CT 09/01/2018, 02/20/2018, January 09, 2018, CT chest 01/05/2018 FINDINGS: CTA CHEST FINDINGS Cardiovascular: Non contrasted images of the chest demonstrate moderate aortic atherosclerosis. No acute intramural hematoma. Wall calcification related to known dissection again noted. Following contrast, redemonstrated type B aortic dissection originating just distal to the subclavian takeoff. Ascending aorta demonstrates normal caliber and no dissection flap. Coronary vascular calcifications. Normal heart size. No pericardial effusion. Mostly thrombosed false lumen with persistent saccular enhancement at the distal arch, though without significant change as compared with prior exams. Dilatation of the distal arch up to 4.6 cm maximum, stable as compared with 09/01/2018 and 02/20/2018. tapering of the descending thoracic aorta 2 maximum diameter 3.8 cm as before. No acute mediastinal hematoma or stranding to suggest leakage or rupture. Mediastinum/Nodes: Midline trachea. No thyroid mass. No significant adenopathy. Esophagus within normal limits Lungs/Pleura: Emphysema. No acute consolidation, pleural effusion, or pneumothorax. Musculoskeletal: Degenerative changes. No acute or suspicious osseous abnormality.  Review of the MIP images confirms the above findings. CTA ABDOMEN AND PELVIS FINDINGS VASCULAR Aorta: Chronic dissection to the iliac bifurcation. Aneurysmal dilatation of the infrarenal abdominal aorta measuring 4.8 x 4.4 cm, previously 4.9 x 4.4 cm. Thrombosed false lumen. Celiac: Arises from true lumen and appears patent. There may be mild narrowing at the origin. SMA: Patent without evidence of aneurysm, dissection, vasculitis or significant stenosis. Renals: Single right and single left renal arteries. Right renal artery is patent. Stenosis of the proximal left renal artery over a 1.2 cm length, similar compared  to prior. IMA: Not well seen and probably occluded.  Distal reconstitution. Inflow: Mild to moderate focal stenosis of the right common iliac artery. Moderate disease of the right internal iliac artery. Right external iliac artery is patent. No significant stenosis left common iliac artery. Ectatic distally up to 1.5 cm. No dissection. Moderate disease of the left internal iliac artery. Left external iliac artery is patent. Veins: No obvious venous abnormality within the limitations of this arterial phase study. Review of the MIP images confirms the above findings. NON-VASCULAR Hepatobiliary: No focal liver abnormality is seen. No gallstones, gallbladder wall thickening, or biliary dilatation. Pancreas: Unremarkable. No pancreatic ductal dilatation or surrounding inflammatory changes. Spleen: Normal in size without focal abnormality. Adrenals/Urinary Tract: Adrenal glands are unremarkable. Kidneys are normal, without renal calculi, focal lesion, or hydronephrosis. Bladder is unremarkable. Stomach/Bowel: Stomach is within normal limits. Appendix appears normal. No evidence of bowel wall thickening, distention, or inflammatory changes. Sigmoid colon diverticular disease without acute inflammatory change Lymphatic: No significantly enlarged lymph nodes Reproductive: Prostate is unremarkable. Other:  Negative for free air or free fluid Musculoskeletal: Posterior stabilization rods and fixating screws L4 through S1. Interbody devices at L4-L5 and L5-S1. Review of the MIP images confirms the above findings. IMPRESSION: 1. Stable appearance of known type B aortic dissection with maximum dilatation of the distal aortic arch up to 4.6 cm. Recommend semi-annual imaging followup by CTA or MRA and referral to cardiothoracic surgery if not already obtained. This recommendation follows 2010 ACCF/AHA/AATS/ACR/ASA/SCA/SCAI/SIR/STS/SVM Guidelines for the Diagnosis and Management of Patients With Thoracic Aortic Disease. Circulation. 2010; [id number]:[id number]. Aortic aneurysm NOS (ICD10-I71.9) 2. Stable 4.8 cm infrarenal abdominal aortic aneurysm. Recommend followup by abdomen and pelvis CTA in 6 months, and vascular surgery referral/consultation if not already obtained. This recommendation follows ACR consensus guidelines: White Paper of the ACR Incidental Findings Committee II on Vascular Findings. J Am Coll Radiol 2013; 10:789-794. Aortic aneurysm NOS (ICD10-I71.9) 3. Emphysematous disease. Clear lung fields no CT evidence for acute intra-abdominal or pelvic abnormality Electronically Signed   By: Donavan Foil M.D.   On: 11/03/2018 02:54    EKG: Independently reviewed. Sinus tachycardia (rate 106), lateral T-wave abnormality.   Assessment/Plan   1. COPD with acute exacerbation; chronic hypoxic respiratory failure   - Presents with SOB, wheezing, and fevers, found to have clear lungs on CT and COVID-19 is negative  - Treated with Duonebs, IV mag, and IV Solu-Medrol in ED  - Check sputum culture, check respiratory virus panel, continue Trelegy, albuterol nebs, steroids, and continue antibiotics with azithromycin   2. SIRS  - Presents with SOB, wheezing, and fevers, found to be tachypneic and tachycardic with leukocytosis, negative for COVID-19 and lungs appear clear on CT  - Lactate is reassuringly normal and BP  stable   - ?respiratory virus, check respiratory virus panel, follow cultures and clinical course    3. CAD  - No anginal complaints  - Continue ASA, Plavix, statin, beta-blocker   4. Anxiety  - Continue Buspar and as-needed Xanax    5. Hypothyroidism  - Continue Synthroid    6. AAA; type B aortic dissection  - Both appear stable on CTA in ED  - He already has outpatient vascular surgery follow-up arranged    PPE: Mask, face shield  DVT prophylaxis: Lovenox  Code Status: Full  Family Communication: Discussed with patient  Consults called: None  Admission status: Observation     Vianne Bulls, MD [facility] Pager [phone]  If 7PM-7AM, please  contact night-coverage [email or web address] Password [email or web address]  11/03/2018, 4:09 AM

## 2018-11-03 NOTE — Progress Notes (Signed)
PROGRESS NOTE  [name] [id number] DOB: 11-17-1950 DOA: 11/02/2018 PCP: Wenda Low, MD  Brief History:  68 year old male with a history of COPD, chronic respiratory failure on 3 L, coronary disease, aortic dissection, anxiety, hypertension, alcohol abuse in remission, hyperlipidemia presenting with 1 month history of shortness of breath that was significantly worsened in the past 1 to 2 days prior to admission.  He complains of worsening cough to the point of having posttussive emesis.  He denies any hemoptysis.  He states that he had a temperature up to 105.0 F at home.  He denied any chest pain, abdominal pain, dysuria, hematochezia, melena, diarrhea.  He quit smoking 1 year ago after 40-pack-year history.  He denies worsening lower extremity edema orthopnea.  In the emergency department, the patient had low-grade temperature 100.1 F with his mildly tachycardic 100-110, with oxygen saturation 98-100% on 3.5 L.  He was hemodynamically stable.  WBC was 13.8, otherwise BMP, LFTs, and CBC were unremarkable.  CTA chest/abd showed a stable type B aortic dissection with maximal dilatation 4.6 cm.  There was also a stable infrarenal AAA at 4.8 cm.  Lungs showed emphysema without any consolidation or pleural effusions.  Assessment/Plan: COPD exacerbation -Continue IV Solu-Medrol -Start Pulmicort -Continue duo nebs -Viral respiratory panel  Chronic respiratory failure with hypoxia -Patient is on 3.5 L at home -Currently stable on 3.5 L -CT angiogram chest as discussed above  SIRS -Follow blood cultures -UA negative for pyuria -CT chest negative for infiltrates or consolidation -CT abdomen and pelvis as discussed above negative for acute findings -Continue IV fluids  Coronary artery disease -No chest pain presently -Continue aspirin and Plavix -Continue metoprolol tartrate -Continue statin -09/20/18 cath--patent LIMA-LAD, 80% stenosis in the SVG-Diag with PCI/DESx1,  occluded SVG-RCA with PCI/DES to the native RCA x3. Plan for DAPT with ASA/Plavix for at least one year  Chronic back pain -Continue home dose tramadol, gabapentin  Anxiety -Continue home dose alprazolam, trazodone, BuSpar  Essential hypertension -Holding amlodipine secondary to soft blood pressure  Hypothyroidism -Continue Synthroid  Hyperlipidemia -Continue statin  Aortic dissection/infrarenal AAA - Both appear stable on CTA in ED  - He already has outpatient vascular surgery follow-up arranged       Disposition Plan:   Home in 2-3 days  Family Communication:   Family at bedside  Consultants:  none  Code Status:  FULL  DVT Prophylaxis:   Byng Lovenox   Procedures: As Listed in Progress Note Above  Antibiotics: azithro 10/12>>>    Subjective: Patient continues to have nonproductive cough.  Denies hemoptysis.  Denies fevers, chills, chest pain, vomiting, diarrhea, abdominal pain.  He continues to have shortness of breath although better than yesterday.  Objective: Vitals:   11/03/18 0500 11/03/18 0530 11/03/18 0600 11/03/18 0630  BP: 96/62 114/63 109/66 113/67  Pulse: (!) 104 (!) 107 (!) 110 (!) 109  Resp: 16 (!) 21 (!) 23 (!) 23  Temp:      TempSrc:      SpO2: 100% 100% 98% 96%  Weight:      Height:        Intake/Output Summary (Last 24 hours) at 11/03/2018 0805 Last data filed at 11/03/2018 0234 Gross per 24 hour  Intake --  Output 500 ml  Net -500 ml   Weight change:  Exam:   General:  Pt is alert, follows commands appropriately, not in acute distress  HEENT: No icterus, No thrush, No neck  mass, [place]/AT  Cardiovascular: RRR, S1/S2, no rubs, no gallops  Respiratory: Diminished breath sounds.  Bibasilar rales.  Mild bibasilar wheeze.  Abdomen: Soft/+BS, non tender, non distended, no guarding  Extremities: No edema, No lymphangitis, No petechiae, No rashes, no synovitis   Data Reviewed: I have personally reviewed following labs and  imaging studies Basic Metabolic Panel: Recent Labs  Lab 11/02/18 2334  NA 140  K 3.6  CL 101  CO2 28  GLUCOSE 103*  BUN 11  CREATININE 0.83  CALCIUM 8.5*   Liver Function Tests: Recent Labs  Lab 11/02/18 2334  AST 15  ALT 11  ALKPHOS 50  BILITOT 0.5  PROT 6.8  ALBUMIN 4.0   No results for input(s): LIPASE, AMYLASE in the last 168 hours. No results for input(s): AMMONIA in the last 168 hours. Coagulation Profile: Recent Labs  Lab 11/02/18 2334  INR 1.0   CBC: Recent Labs  Lab 11/02/18 2334  WBC 13.8*  NEUTROABS 12.6*  HGB 13.2  HCT 40.8  MCV 102.3*  PLT 190   Cardiac Enzymes: No results for input(s): CKTOTAL, CKMB, CKMBINDEX, TROPONINI in the last 168 hours. BNP: Invalid input(s): POCBNP CBG: No results for input(s): GLUCAP in the last 168 hours. HbA1C: No results for input(s): HGBA1C in the last 72 hours. Urine analysis:    Component Value Date/Time   COLORURINE STRAW (A) 11/02/2018 2319   APPEARANCEUR CLEAR 11/02/2018 2319   LABSPEC 1.009 11/02/2018 2319   PHURINE 5.0 11/02/2018 2319   GLUCOSEU NEGATIVE 11/02/2018 2319   HGBUR SMALL (A) 11/02/2018 2319   BILIRUBINUR NEGATIVE 11/02/2018 2319   KETONESUR NEGATIVE 11/02/2018 2319   PROTEINUR NEGATIVE 11/02/2018 2319   UROBILINOGEN 0.2 08/26/2012 0930   NITRITE NEGATIVE 11/02/2018 2319   LEUKOCYTESUR NEGATIVE 11/02/2018 2319   Sepsis Labs: @LABRCNTIP (procalcitonin:4,lacticidven:4) ) Recent Results (from the past 240 hour(s))  SARS Coronavirus 2 by RT PCR (hospital order, performed in [place] hospital lab) Nasopharyngeal Nasopharyngeal Swab     Status: None   Collection Time: 11/02/18 11:07 PM   Specimen: Nasopharyngeal Swab  Result Value Ref Range Status   SARS Coronavirus 2 NEGATIVE NEGATIVE Final    Comment: (NOTE) If result is NEGATIVE SARS-CoV-2 target nucleic acids are NOT DETECTED. The SARS-CoV-2 RNA is generally detectable in upper and lower  respiratory specimens during the  acute phase of infection. The lowest  concentration of SARS-CoV-2 viral copies this assay can detect is 250  copies / mL. A negative result does not preclude SARS-CoV-2 infection  and should not be used as the sole basis for treatment or other  patient management decisions.  A negative result may occur with  improper specimen collection / handling, submission of specimen other  than nasopharyngeal swab, presence of viral mutation(s) within the  areas targeted by this assay, and inadequate number of viral copies  (<250 copies / mL). A negative result must be combined with clinical  observations, patient history, and epidemiological information. If result is POSITIVE SARS-CoV-2 target nucleic acids are DETECTED. The SARS-CoV-2 RNA is generally detectable in upper and lower  respiratory specimens dur ing the acute phase of infection.  Positive  results are indicative of active infection with SARS-CoV-2.  Clinical  correlation with patient history and other diagnostic information is  necessary to determine patient infection status.  Positive results do  not rule out bacterial infection or co-infection with other viruses. If result is PRESUMPTIVE POSTIVE SARS-CoV-2 nucleic acids MAY BE PRESENT.   A presumptive positive result was  obtained on the submitted specimen  and confirmed on repeat testing.  While 2019 novel coronavirus  (SARS-CoV-2) nucleic acids may be present in the submitted sample  additional confirmatory testing may be necessary for epidemiological  and / or clinical management purposes  to differentiate between  SARS-CoV-2 and other Sarbecovirus currently known to infect humans.  If clinically indicated additional testing with an alternate test  methodology [phone]) is advised. The SARS-CoV-2 RNA is generally  detectable in upper and lower respiratory sp ecimens during the acute  phase of infection. The expected result is Negative. Fact Sheet for Patients:   StrictlyIdeas.no Fact Sheet for Healthcare Providers: BankingDealers.co.za This test is not yet approved or cleared by the [place] FDA and has been authorized for detection and/or diagnosis of SARS-CoV-2 by FDA under an Emergency Use Authorization (EUA).  This EUA will remain in effect (meaning this test can be used) for the duration of the COVID-19 declaration under Section 564(b)(1) of the Act, 21 U.S.C. section 360bbb-3(b)(1), unless the authorization is terminated or revoked sooner. Performed at [facility], [address] [place], [place] [address]   Blood Culture (routine x 2)     Status: None (Preliminary result)   Collection Time: 11/02/18 11:25 PM   Specimen: BLOOD  Result Value Ref Range Status   Specimen Description BLOOD RIGHT ANTECUBITAL  Final   Special Requests   Final    BOTTLES DRAWN AEROBIC AND ANAEROBIC Blood Culture adequate volume   Culture   Final    NO GROWTH < 12 HOURS Performed at [facility], [address] [place], [place] [address]    Report Status PENDING  Incomplete  Blood Culture (routine x 2)     Status: None (Preliminary result)   Collection Time: 11/02/18 11:34 PM   Specimen: BLOOD  Result Value Ref Range Status   Specimen Description BLOOD RIGHT ANTECUBITAL  Final   Special Requests   Final    BOTTLES DRAWN AEROBIC AND ANAEROBIC Blood Culture adequate volume   Culture   Final    NO GROWTH < 12 HOURS Performed at [facility], [address], [place], [place] [address]    Report Status PENDING  Incomplete     Scheduled Meds:  budesonide (PULMICORT) nebulizer solution  0.5 mg Nebulization BID   ipratropium-albuterol  3 mL Nebulization Q6H   methylPREDNISolone (SOLU-MEDROL) injection  60 mg Intravenous Once   traMADol  100 mg Oral Once   Continuous Infusions:  sodium chloride Stopped (11/03/18 0621)   ceFEPime (MAXIPIME) IV     [START ON 11/04/2018] vancomycin       Procedures/Studies: Ct Head Wo Contrast  Result Date: 11/03/2018 CLINICAL DATA:  Central vertigo, fever of 103.7, wheezing EXAM: CT HEAD WITHOUT CONTRAST TECHNIQUE: Contiguous axial images were obtained from the base of the skull through the vertex without intravenous contrast. COMPARISON:  CT 01/08/2018 FINDINGS: Brain: No evidence of acute infarction, hemorrhage, hydrocephalus, extra-axial collection or mass lesion/mass effect. Symmetric prominence of the ventricles, cisterns and sulci compatible with parenchymal volume loss. Patchy areas of white matter hypoattenuation are most compatible with chronic microvascular angiopathy. Vascular: Atherosclerotic calcification of the carotid siphons and intradural vertebral arteries. No hyperdense vessel. Skull: No calvarial fracture or suspicious osseous lesion. No scalp swelling or hematoma. Sinuses/Orbits: Paranasal sinuses and mastoid air cells are predominantly clear. Included orbital structures are unremarkable. Other: None IMPRESSION: 1. No acute intracranial abnormality. 2. Generalized parenchymal volume loss and chronic microvascular ischemic white matter disease. Electronically Signed   By:  Lovena Le M.D.   On: 11/03/2018 02:31   Dg Chest Port 1 View  Result Date: 11/02/2018 CLINICAL DATA:  Cough, fever EXAM: PORTABLE CHEST 1 VIEW COMPARISON:  CT 02/20/2018, radiograph 01/12/2018 FINDINGS: Portion of the right lung base is collimated from view. Blunting of the left costophrenic sulcus may reflect scarring or trace effusion. The lungs are hyperinflated compatible with emphysematous change seen on prior cross-sectional imaging. Chronic bronchitic changes are again noted. There are numerous surgical clips about the mediastinum compatible with history of prior CABG. Chart notes patient had sternal sutures removed previously. Prominence of the aortic arch corresponds to the type B aortic dissection seen on prior angiography. No new consolidative  process. No visible pneumothorax. No acute osseous or soft tissue abnormality. High-riding humeral heads likely reflect chronic rotator cuff insufficiency. IMPRESSION: 1. Trace left pleural effusion versus scarring. No new consolidative process. 2. Emphysematous and chronic bronchitic changes. 3. Prominence of the aortic arch corresponds to the patient's known type B aortic dissection Electronically Signed   By: Lovena Le M.D.   On: 11/02/2018 23:30   Ct Angio Chest/abd/pel For Dissection W And/or Wo Contrast  Result Date: 11/03/2018 CLINICAL DATA:  Chest pain dizziness fever history of dissection EXAM: CT ANGIOGRAPHY CHEST, ABDOMEN AND PELVIS TECHNIQUE: Multidetector CT imaging through the chest, abdomen and pelvis was performed using the standard protocol during bolus administration of intravenous contrast. Multiplanar reconstructed images and MIPs were obtained and reviewed to evaluate the vascular anatomy. CONTRAST:  118mL OMNIPAQUE IOHEXOL 350 MG/ML SOLN COMPARISON:  Chest x-ray 11/02/2018, CT 09/01/2018, 02/20/2018, January 09, 2018, CT chest 01/05/2018 FINDINGS: CTA CHEST FINDINGS Cardiovascular: Non contrasted images of the chest demonstrate moderate aortic atherosclerosis. No acute intramural hematoma. Wall calcification related to known dissection again noted. Following contrast, redemonstrated type B aortic dissection originating just distal to the subclavian takeoff. Ascending aorta demonstrates normal caliber and no dissection flap. Coronary vascular calcifications. Normal heart size. No pericardial effusion. Mostly thrombosed false lumen with persistent saccular enhancement at the distal arch, though without significant change as compared with prior exams. Dilatation of the distal arch up to 4.6 cm maximum, stable as compared with 09/01/2018 and 02/20/2018. tapering of the descending thoracic aorta 2 maximum diameter 3.8 cm as before. No acute mediastinal hematoma or stranding to suggest  leakage or rupture. Mediastinum/Nodes: Midline trachea. No thyroid mass. No significant adenopathy. Esophagus within normal limits Lungs/Pleura: Emphysema. No acute consolidation, pleural effusion, or pneumothorax. Musculoskeletal: Degenerative changes. No acute or suspicious osseous abnormality. Review of the MIP images confirms the above findings. CTA ABDOMEN AND PELVIS FINDINGS VASCULAR Aorta: Chronic dissection to the iliac bifurcation. Aneurysmal dilatation of the infrarenal abdominal aorta measuring 4.8 x 4.4 cm, previously 4.9 x 4.4 cm. Thrombosed false lumen. Celiac: Arises from true lumen and appears patent. There may be mild narrowing at the origin. SMA: Patent without evidence of aneurysm, dissection, vasculitis or significant stenosis. Renals: Single right and single left renal arteries. Right renal artery is patent. Stenosis of the proximal left renal artery over a 1.2 cm length, similar compared to prior. IMA: Not well seen and probably occluded.  Distal reconstitution. Inflow: Mild to moderate focal stenosis of the right common iliac artery. Moderate disease of the right internal iliac artery. Right external iliac artery is patent. No significant stenosis left common iliac artery. Ectatic distally up to 1.5 cm. No dissection. Moderate disease of the left internal iliac artery. Left external iliac artery is patent. Veins: No obvious venous abnormality within the  limitations of this arterial phase study. Review of the MIP images confirms the above findings. NON-VASCULAR Hepatobiliary: No focal liver abnormality is seen. No gallstones, gallbladder wall thickening, or biliary dilatation. Pancreas: Unremarkable. No pancreatic ductal dilatation or surrounding inflammatory changes. Spleen: Normal in size without focal abnormality. Adrenals/Urinary Tract: Adrenal glands are unremarkable. Kidneys are normal, without renal calculi, focal lesion, or hydronephrosis. Bladder is unremarkable. Stomach/Bowel: Stomach  is within normal limits. Appendix appears normal. No evidence of bowel wall thickening, distention, or inflammatory changes. Sigmoid colon diverticular disease without acute inflammatory change Lymphatic: No significantly enlarged lymph nodes Reproductive: Prostate is unremarkable. Other: Negative for free air or free fluid Musculoskeletal: Posterior stabilization rods and fixating screws L4 through S1. Interbody devices at L4-L5 and L5-S1. Review of the MIP images confirms the above findings. IMPRESSION: 1. Stable appearance of known type B aortic dissection with maximum dilatation of the distal aortic arch up to 4.6 cm. Recommend semi-annual imaging followup by CTA or MRA and referral to cardiothoracic surgery if not already obtained. This recommendation follows 2010 ACCF/AHA/AATS/ACR/ASA/SCA/SCAI/SIR/STS/SVM Guidelines for the Diagnosis and Management of Patients With Thoracic Aortic Disease. Circulation. 2010; [id number]. Aortic aneurysm NOS (ICD10-I71.9) 2. Stable 4.8 cm infrarenal abdominal aortic aneurysm. Recommend followup by abdomen and pelvis CTA in 6 months, and vascular surgery referral/consultation if not already obtained. This recommendation follows ACR consensus guidelines: White Paper of the ACR Incidental Findings Committee II on Vascular Findings. J Am Coll Radiol 2013; 10:789-794. Aortic aneurysm NOS (ICD10-I71.9) 3. Emphysematous disease. Clear lung fields no CT evidence for acute intra-abdominal or pelvic abnormality Electronically Signed   By: Donavan Foil M.D.   On: 11/03/2018 02:54    Orson Eva, DO  [facility] Pager [phone]  If 7PM-7AM, please contact night-coverage [email or web address] Password [email or web address] 11/03/2018, 8:05 AM   LOS: 0 days

## 2018-11-03 NOTE — ED Notes (Signed)
Pt given a warm blanket 

## 2018-11-03 NOTE — ED Notes (Signed)
Pt 02 sat dropped from 96% to 87% on RA after taking approximately 20 steps away from the bed.

## 2018-11-04 LAB — CBC WITH DIFFERENTIAL/PLATELET
Abs Immature Granulocytes: 0.07 10*3/uL (ref 0.00–0.07)
Basophils Absolute: 0 10*3/uL (ref 0.0–0.1)
Basophils Relative: 0 %
Eosinophils Absolute: 0 10*3/uL (ref 0.0–0.5)
Eosinophils Relative: 0 %
HCT: 33.5 % — ABNORMAL LOW (ref 39.0–52.0)
Hemoglobin: 11 g/dL — ABNORMAL LOW (ref 13.0–17.0)
Immature Granulocytes: 1 %
Lymphocytes Relative: 5 %
Lymphs Abs: 0.7 10*3/uL (ref 0.7–4.0)
MCH: 34 pg (ref 26.0–34.0)
MCHC: 32.8 g/dL (ref 30.0–36.0)
MCV: 103.4 fL — ABNORMAL HIGH (ref 80.0–100.0)
Monocytes Absolute: 0.2 10*3/uL (ref 0.1–1.0)
Monocytes Relative: 1 %
Neutro Abs: 13.9 10*3/uL — ABNORMAL HIGH (ref 1.7–7.7)
Neutrophils Relative %: 93 %
Platelets: 163 10*3/uL (ref 150–400)
RBC: 3.24 MIL/uL — ABNORMAL LOW (ref 4.22–5.81)
RDW: 13.5 % (ref 11.5–15.5)
WBC: 14.9 10*3/uL — ABNORMAL HIGH (ref 4.0–10.5)
nRBC: 0 % (ref 0.0–0.2)

## 2018-11-04 LAB — BASIC METABOLIC PANEL
Anion gap: 7 (ref 5–15)
BUN: 16 mg/dL (ref 8–23)
CO2: 26 mmol/L (ref 22–32)
Calcium: 8.2 mg/dL — ABNORMAL LOW (ref 8.9–10.3)
Chloride: 105 mmol/L (ref 98–111)
Creatinine, Ser: 0.71 mg/dL (ref 0.61–1.24)
GFR calc Af Amer: >60 mL/min (ref >60)
GFR calc non Af Amer: >60 mL/min (ref >60)
Glucose, Bld: 176 mg/dL — ABNORMAL HIGH (ref 70–99)
Potassium: 4.1 mmol/L (ref 3.5–5.1)
Sodium: 138 mmol/L (ref 135–145)

## 2018-11-04 LAB — URINE CULTURE: Culture: NO GROWTH

## 2018-11-04 MED ORDER — AMOXICILLIN-POT CLAVULANATE 875-125 MG PO TABS
1.0000 | ORAL_TABLET | Freq: Two times a day (BID) | ORAL | Status: DC
  Administered 2018-11-04 – 2018-11-05 (×3): 1 via ORAL
  Filled 2018-11-04 (×3): qty 1

## 2018-11-04 MED ORDER — TRAMADOL HCL 50 MG PO TABS
100.0000 mg | ORAL_TABLET | Freq: Three times a day (TID) | ORAL | Status: DC | PRN
  Administered 2018-11-04 – 2018-11-05 (×3): 100 mg via ORAL
  Filled 2018-11-04 (×3): qty 2

## 2018-11-04 NOTE — Evaluation (Signed)
Clinical/Bedside Swallow Evaluation Patient Details  Name: [name] MRN: [id number] Date of Birth: Oct 01, 1950  Today's Date: 11/04/2018 Time: SLP Start Time (ACUTE ONLY): 72 SLP Stop Time (ACUTE ONLY): 1252 SLP Time Calculation (min) (ACUTE ONLY): 22 min  Past Medical History:  Past Medical History:  Diagnosis Date  . Anemia   . Anxiety   . Aortic dissection (HCC)    TYPE 3  . CAD (coronary artery disease)    multiple, most recently Jan-March 2017  . Cardiac tamponade   . Chronic bronchitis ([place])   . Chronic fatigue   . Chronic pain   . Constipation   . COPD (chronic obstructive pulmonary disease) ([place])   . Depression   . Dysphagia, oropharyngeal phase   . ETOH abuse   . GERD (gastroesophageal reflux disease)   . Hiatal hernia   . HTN (hypertension)   . Hypercholesterolemia   . Metabolic encephalopathy   . Opioid abuse ([place])   . Peptic ulcer disease 08/2010   EGD   . PNA (pneumonia)   . Schatzki's ring    Past Surgical History:  Past Surgical History:  Procedure Laterality Date  . BACK SURGERY    . CABG X 6  07/31/2007   [facility]  . CARDIAC CATHETERIZATION    . CARDIAC CATHETERIZATION N/A 03/16/2015   Procedure: Coronary/Graft Angiography;  Surgeon: Sherren Mocha, MD;  Location: [place] [facility];  Service: Cardiovascular;  Laterality: N/A;  . CARDIAC CATHETERIZATION N/A 03/16/2015   Procedure: Coronary Stent Intervention;  Surgeon: Sherren Mocha, MD;  Location: [place] [facility];  Service: Cardiovascular;  Laterality: N/A;  . CORONARY STENT INTERVENTION N/A 09/21/2018   Procedure: CORONARY STENT INTERVENTION;  Surgeon: Jettie Booze, MD;  Location: [place] [facility];  Service: Cardiovascular;  Laterality: N/A;  . KNEE SURGERY Right    acl  . LEFT HEART CATH AND CORS/GRAFTS ANGIOGRAPHY N/A 09/21/2018   Procedure: LEFT HEART CATH AND CORS/GRAFTS ANGIOGRAPHY;  Surgeon: Jettie Booze, MD;  Location: [place] [facility];  Service: Cardiovascular;   Laterality: N/A;  . OLECRANON BURSECTOMY Left 08/27/2012   Procedure: EXCISION LEFT OLECRANON BURSA;  Surgeon: Sanjuana Kava, MD;  Location: [facility];  Service: Orthopedics;  Laterality: Left;  . OLECRANON BURSECTOMY Left 05/25/2013   Procedure:  Excision sinus tract and tissue left elbow ;  Surgeon: Sanjuana Kava, MD;  Location: [facility];  Service: Orthopedics;  Laterality: Left;  . ORIF SCAPULAR FRACTURE Right   . SHOULDER SURGERY Left   . STERNAL WIRE REMOVEAL  12/31/2007   [facility]   HPI:  68 year old male with a history of COPD, chronic respiratory failure on 3 L, coronary disease, aortic dissection, anxiety, hypertension, alcohol abuse in remission, hyperlipidemia presenting with 1 month history of shortness of breath that was significantly worsened in the past 1 to 2 days prior to admission.  He complains of worsening cough to the point of having posttussive emesis.  He denies any hemoptysis.  He states that he had a temperature up to 105.0 F at home.  He denied any chest pain, abdominal pain, dysuria, hematochezia, melena, diarrhea.  He quit smoking 1 year ago after 40-pack-year history.  He denies worsening lower extremity edema orthopnea.  In the emergency department, the patient had low-grade temperature 100.1 F with his mildly tachycardic 100-110, with oxygen saturation 98-100% on 3.5 L.  He was hemodynamically stable.  WBC was 13.8, otherwise BMP, LFTs, and CBC were unremarkable.  CTA chest/abd showed a stable type B  aortic dissection with maximal dilatation 4.6 cm.  There was also a stable infrarenal AAA at 4.8 cm.  Lungs showed emphysema without any consolidation or pleural effusions. BSE requested.   Assessment / Plan / Recommendation Clinical Impression  Clinical swallow evaluation completed at bedside. Oral motor examination is WNL. Pt with strong reflexive coughing upon SLP arrival and prior to po administration. Pt reports his cough has been "terrible" for the past month. Pt also  coughing after po trials, however suspect this is due to decreased coordination of respiration and swallow due to severity of COPD. Unable to determine if true dysphagia. Can complete MBSS tomorrow AM if MD desires. Pt tells SLP that he is supposed to go home tomorrow. Continue diet as ordered and plan for MBSS tomorrow.   SLP Visit Diagnosis: Dysphagia, unspecified (R13.10)    Aspiration Risk  Mild aspiration risk    Diet Recommendation Regular;Thin liquid   Liquid Administration via: Cup;No straw Medication Administration: Whole meds with liquid Supervision: Patient able to self feed Compensations: Slow rate;Small sips/bites Postural Changes: Remain upright for at least 30 minutes after po intake;Seated upright at 90 degrees    Other  Recommendations Oral Care Recommendations: Oral care BID;Staff/trained caregiver to provide oral care Other Recommendations: Clarify dietary restrictions   Follow up Recommendations None      Frequency and Duration min 1 x/week  1 week       Prognosis Prognosis for Safe Diet Advancement: Good      Swallow Study   General Date of Onset: 11/02/18 HPI: 68 year old male with a history of COPD, chronic respiratory failure on 3 L, coronary disease, aortic dissection, anxiety, hypertension, alcohol abuse in remission, hyperlipidemia presenting with 1 month history of shortness of breath that was significantly worsened in the past 1 to 2 days prior to admission.  He complains of worsening cough to the point of having posttussive emesis.  He denies any hemoptysis.  He states that he had a temperature up to 105.0 F at home.  He denied any chest pain, abdominal pain, dysuria, hematochezia, melena, diarrhea.  He quit smoking 1 year ago after 40-pack-year history.  He denies worsening lower extremity edema orthopnea.  In the emergency department, the patient had low-grade temperature 100.1 F with his mildly tachycardic 100-110, with oxygen saturation 98-100% on  3.5 L.  He was hemodynamically stable.  WBC was 13.8, otherwise BMP, LFTs, and CBC were unremarkable.  CTA chest/abd showed a stable type B aortic dissection with maximal dilatation 4.6 cm.  There was also a stable infrarenal AAA at 4.8 cm.  Lungs showed emphysema without any consolidation or pleural effusions. BSE requested. Type of Study: Bedside Swallow Evaluation Previous Swallow Assessment: None on record Diet Prior to this Study: Regular;Thin liquids Temperature Spikes Noted: No Respiratory Status: Nasal cannula History of Recent Intubation: No Behavior/Cognition: Alert;Cooperative;Pleasant mood Oral Cavity Assessment: Within Functional Limits Oral Care Completed by SLP: No Oral Cavity - Dentition: Edentulous(has U/L dentures but not present) Vision: Functional for self-feeding Self-Feeding Abilities: Able to feed self Patient Positioning: Upright in bed Baseline Vocal Quality: Normal Volitional Cough: Strong Volitional Swallow: Able to elicit    Oral/Motor/Sensory Function Overall Oral Motor/Sensory Function: Within functional limits   Ice Chips Ice chips: Impaired Presentation: Spoon Pharyngeal Phase Impairments: Cough - Immediate   Thin Liquid Thin Liquid: Impaired Presentation: Cup;Self Fed Pharyngeal  Phase Impairments: Cough - Immediate    Nectar Thick Nectar Thick Liquid: Not tested   Honey Thick Honey Thick Liquid: Not  tested   Puree Puree: Within functional limits Presentation: Self Fed;Spoon   Solid     Solid: Impaired Presentation: Spoon;Self Fed Pharyngeal Phase Impairments: Cough - Immediate     Thank you,  Genene Churn, [place]  Devaun Hernandez 11/04/2018,12:55 PM

## 2018-11-04 NOTE — Progress Notes (Signed)
PROGRESS NOTE  [name] [id number] DOB: Nov 03, 1950 DOA: 11/02/2018 PCP: Wenda Low, MD  Brief History:  68 year old male with a history of COPD, chronic respiratory failure on 3 L, coronary disease, aortic dissection, anxiety, hypertension, alcohol abuse in remission, hyperlipidemia presenting with 1 month history of shortness of breath that was significantly worsened in the past 1 to 2 days prior to admission.  He complains of worsening cough to the point of having posttussive emesis.  He denies any hemoptysis.  He states that he had a temperature up to 105.0 F at home.  He denied any chest pain, abdominal pain, dysuria, hematochezia, melena, diarrhea.  He quit smoking 1 year ago after 40-pack-year history.  He denies worsening lower extremity edema orthopnea.  In the emergency department, the patient had low-grade temperature 100.1 F with his mildly tachycardic 100-110, with oxygen saturation 98-100% on 3.5 L.  He was hemodynamically stable.  WBC was 13.8, otherwise BMP, LFTs, and CBC were unremarkable.  CTA chest/abd showed a stable type B aortic dissection with maximal dilatation 4.6 cm.  There was also a stable infrarenal AAA at 4.8 cm.  Lungs showed emphysema without any consolidation or pleural effusions.  Assessment/Plan: COPD exacerbation -Continue IV Solu-Medrol and Pulmicort -Continue duo nebs and start flutter valve -Viral respiratory panel pending.  Dysphagia -Speech therapy has been consulted to evaluate for potential aspiration component.  Chronic respiratory failure with hypoxia -Patient is on and off 2-3 L at home -Currently stable on 2.5 L supplementation abnormalities patient expressed taking oxygen off to go to the bathroom and intermittently while resting). -CT angiogram chest without acute infiltrates/opacities and negative for pulmonary embolism.  SIRS -Follow final blood cultures results -UA negative for pyuria and patient denying dysuria. -CT  chest negative for infiltrates or consolidation -CT abdomen and pelvis negative for acute findings as well. -Continue supportive care and treatment for COPD exacerbation. -Sepsis rule out.  Coronary artery disease -No chest pain presently -Continue aspirin and Plavix -Continue metoprolol tartrate -Continue statin -09/20/18 cath--patent LIMA-LAD, 80% stenosis in the SVG-Diag with PCI/DESx1, occluded SVG-RCA with PCI/DES to the native RCA x3. Plan for DAPT with ASA/Plavix for at least one year -In the setting of negative abnormalities on telemetry will discontinue cardiac monitoring.  Chronic back pain -Continue home dose of gabapentin -Tramadol dose has been adjusted for better pain control.  Anxiety -Continue home dose alprazolam, trazodone, BuSpar -Mood is stable.  Essential hypertension -Continue holding antihypertensive medications in the setting of soft blood pressure -Continue heart healthy diet.  Hypothyroidism -Continue Synthroid  Hyperlipidemia -Continue statin -Heart healthy diet encouraged.  Aortic dissection/infrarenal AAA - Both appear stable on CTA performed on this admission. - He already has outpatient vascular surgery follow-up arranged    Disposition Plan:   Home in 1-2 days  Family Communication:   No family at bedside.  Consultants:  none  Code Status:  FULL  DVT Prophylaxis:    Lovenox   Procedures: As Listed in Progress Note Above  Antibiotics: azithro 10/12>>>10/14 augmentin 10/14    Subjective: Complaining of minimal productive coughing spells (especially when eating), short of breath on exertion and having difficulty speaking in full sentences.  No chest pain, no fever.  Objective: Vitals:   11/04/18 0525 11/04/18 0724 11/04/18 1349 11/04/18 1428  BP: 125/78  107/74   Pulse: 77  91   Resp: 20  18   Temp: 98.2 F (36.8 C)  TempSrc: Oral     SpO2: 100% 93% 97% 98%  Weight:      Height:        Intake/Output Summary  (Last 24 hours) at 11/04/2018 1442 Last data filed at 11/04/2018 1247 Gross per 24 hour  Intake 1181.44 ml  Output 350 ml  Net 831.44 ml   Weight change: -1.943 kg  Exam: General exam: Alert, awake, oriented x 3; no fever, mild difficulty speaking in full sentences, complaining of coughing spells especially while eating/drinking and short of breath on exertion. Respiratory system: Positive rhonchi, mild expiratory wheezing diffusely appreciated; no using accessory muscles on exam.  2 L nasal cannula supplementation. Cardiovascular system: RRR. No murmurs, rubs, gallops.  No JVD. Gastrointestinal system: Abdomen is nondistended, soft and nontender. No organomegaly or masses felt. Normal bowel sounds heard. Central nervous system: Alert and oriented. No focal neurological deficits. Extremities: No C/C/E, +pedal pulses Skin: No rashes, lesions or ulcers Psychiatry: Judgement and insight appear normal. Mood & affect appropriate.    Data Reviewed: I have personally reviewed following labs and imaging studies Basic Metabolic Panel: Recent Labs  Lab 11/02/18 2334 11/04/18 0512  NA 140 138  K 3.6 4.1  CL 101 105  CO2 28 26  GLUCOSE 103* 176*  BUN 11 16  CREATININE 0.83 0.71  CALCIUM 8.5* 8.2*   Liver Function Tests: Recent Labs  Lab 11/02/18 2334  AST 15  ALT 11  ALKPHOS 50  BILITOT 0.5  PROT 6.8  ALBUMIN 4.0   Coagulation Profile: Recent Labs  Lab 11/02/18 2334  INR 1.0   CBC: Recent Labs  Lab 11/02/18 2334 11/04/18 0512  WBC 13.8* 14.9*  NEUTROABS 12.6* 13.9*  HGB 13.2 11.0*  HCT 40.8 33.5*  MCV 102.3* 103.4*  PLT 190 163   CBG: Recent Labs  Lab 11/03/18 1118  GLUCAP 113*   Urine analysis:    Component Value Date/Time   COLORURINE STRAW (A) 11/02/2018 2319   APPEARANCEUR CLEAR 11/02/2018 2319   LABSPEC 1.009 11/02/2018 2319   PHURINE 5.0 11/02/2018 2319   GLUCOSEU NEGATIVE 11/02/2018 2319   HGBUR SMALL (A) 11/02/2018 2319   BILIRUBINUR  NEGATIVE 11/02/2018 2319   KETONESUR NEGATIVE 11/02/2018 2319   PROTEINUR NEGATIVE 11/02/2018 2319   UROBILINOGEN 0.2 08/26/2012 0930   NITRITE NEGATIVE 11/02/2018 2319   LEUKOCYTESUR NEGATIVE 11/02/2018 2319   Sepsis Labs: @LABRCNTIP (procalcitonin:4,lacticidven:4) ) Recent Results (from the past 240 hour(s))  SARS Coronavirus 2 by RT PCR (hospital order, performed in [facility] hospital lab) Nasopharyngeal Nasopharyngeal Swab     Status: None   Collection Time: 11/02/18 11:07 PM   Specimen: Nasopharyngeal Swab  Result Value Ref Range Status   SARS Coronavirus 2 NEGATIVE NEGATIVE Final    Comment: (NOTE) If result is NEGATIVE SARS-CoV-2 target nucleic acids are NOT DETECTED. The SARS-CoV-2 RNA is generally detectable in upper and lower  respiratory specimens during the acute phase of infection. The lowest  concentration of SARS-CoV-2 viral copies this assay can detect is 250  copies / mL. A negative result does not preclude SARS-CoV-2 infection  and should not be used as the sole basis for treatment or other  patient management decisions.  A negative result may occur with  improper specimen collection / handling, submission of specimen other  than nasopharyngeal swab, presence of viral mutation(s) within the  areas targeted by this assay, and inadequate number of viral copies  (<250 copies / mL). A negative result must be combined with clinical  observations, patient  history, and epidemiological information. If result is POSITIVE SARS-CoV-2 target nucleic acids are DETECTED. The SARS-CoV-2 RNA is generally detectable in upper and lower  respiratory specimens dur ing the acute phase of infection.  Positive  results are indicative of active infection with SARS-CoV-2.  Clinical  correlation with patient history and other diagnostic information is  necessary to determine patient infection status.  Positive results do  not rule out bacterial infection or co-infection with other  viruses. If result is PRESUMPTIVE POSTIVE SARS-CoV-2 nucleic acids MAY BE PRESENT.   A presumptive positive result was obtained on the submitted specimen  and confirmed on repeat testing.  While 2019 novel coronavirus  (SARS-CoV-2) nucleic acids may be present in the submitted sample  additional confirmatory testing may be necessary for epidemiological  and / or clinical management purposes  to differentiate between  SARS-CoV-2 and other Sarbecovirus currently known to infect humans.  If clinically indicated additional testing with an alternate test  methodology [phone]) is advised. The SARS-CoV-2 RNA is generally  detectable in upper and lower respiratory sp ecimens during the acute  phase of infection. The expected result is Negative. Fact Sheet for Patients:  StrictlyIdeas.no Fact Sheet for Healthcare Providers: BankingDealers.co.za This test is not yet approved or cleared by the [place] FDA and has been authorized for detection and/or diagnosis of SARS-CoV-2 by FDA under an Emergency Use Authorization (EUA).  This EUA will remain in effect (meaning this test can be used) for the duration of the COVID-19 declaration under Section 564(b)(1) of the Act, 21 U.S.C. section 360bbb-3(b)(1), unless the authorization is terminated or revoked sooner. Performed at [facility], [address], [place], [place] [address]   Urine culture     Status: None   Collection Time: 11/02/18 11:19 PM   Specimen: In/Out Cath Urine  Result Value Ref Range Status   Specimen Description   Final    IN/OUT CATH URINE Performed at [facility], [address] [place], [place] [address]    Special Requests   Final    NONE Performed at [facility], [address] [place], [place] [address]    Culture   Final    NO GROWTH Performed at [facility], [place] [address], [place], [place] [address]    Report Status 11/04/2018 FINAL  Final  Blood  Culture (routine x 2)     Status: None (Preliminary result)   Collection Time: 11/02/18 11:25 PM   Specimen: BLOOD  Result Value Ref Range Status   Specimen Description BLOOD RIGHT ANTECUBITAL  Final   Special Requests   Final    BOTTLES DRAWN AEROBIC AND ANAEROBIC Blood Culture adequate volume   Culture   Final    NO GROWTH 2 DAYS Performed at [facility], [address], [place], [place] [address]    Report Status PENDING  Incomplete  Blood Culture (routine x 2)     Status: None (Preliminary result)   Collection Time: 11/02/18 11:34 PM   Specimen: BLOOD  Result Value Ref Range Status   Specimen Description BLOOD RIGHT ANTECUBITAL  Final   Special Requests   Final    BOTTLES DRAWN AEROBIC AND ANAEROBIC Blood Culture adequate volume   Culture   Final    NO GROWTH 2 DAYS Performed at [facility], [address], [place], [place] [address]    Report Status PENDING  Incomplete     Scheduled Meds:  amoxicillin-clavulanate  1 tablet Oral Q12H   aspirin EC  81  mg Oral Daily   atorvastatin  40 mg Oral q1800   benzonatate  200 mg Oral TID   budesonide (PULMICORT) nebulizer solution  0.5 mg Nebulization BID   busPIRone  7.5 mg Oral TID   clopidogrel  75 mg Oral Daily   enoxaparin (LOVENOX) injection  40 mg Subcutaneous Q24H   feeding supplement (ENSURE ENLIVE)  237 mL Oral BID BM   umeclidinium bromide  1 puff Inhalation Daily   And   fluticasone furoate-vilanterol  1 puff Inhalation Daily   gabapentin  300 mg Oral TID   ipratropium  2 spray Each Nare BID   ipratropium-albuterol  3 mL Nebulization Q6H   levothyroxine  50 mcg Oral Daily   loratadine  10 mg Oral Daily   methylPREDNISolone (SOLU-MEDROL) injection  60 mg Intravenous Q6H   metoprolol tartrate  25 mg Oral TID   pantoprazole  40 mg Oral QHS   sodium chloride flush  3 mL Intravenous Q12H   sodium chloride flush  3 mL Intravenous Q12H   traZODone  100 mg Oral QHS   Continuous Infusions:   sodium chloride Stopped (11/03/18 0328)   sodium chloride      Procedures/Studies: Ct Head Wo Contrast  Result Date: 11/03/2018 CLINICAL DATA:  Central vertigo, fever of 103.7, wheezing EXAM: CT HEAD WITHOUT CONTRAST TECHNIQUE: Contiguous axial images were obtained from the base of the skull through the vertex without intravenous contrast. COMPARISON:  CT 01/08/2018 FINDINGS: Brain: No evidence of acute infarction, hemorrhage, hydrocephalus, extra-axial collection or mass lesion/mass effect. Symmetric prominence of the ventricles, cisterns and sulci compatible with parenchymal volume loss. Patchy areas of white matter hypoattenuation are most compatible with chronic microvascular angiopathy. Vascular: Atherosclerotic calcification of the carotid siphons and intradural vertebral arteries. No hyperdense vessel. Skull: No calvarial fracture or suspicious osseous lesion. No scalp swelling or hematoma. Sinuses/Orbits: Paranasal sinuses and mastoid air cells are predominantly clear. Included orbital structures are unremarkable. Other: None IMPRESSION: 1. No acute intracranial abnormality. 2. Generalized parenchymal volume loss and chronic microvascular ischemic white matter disease. Electronically Signed   By: Lovena Le M.D.   On: 11/03/2018 02:31   Dg Chest Port 1 View  Result Date: 11/02/2018 CLINICAL DATA:  Cough, fever EXAM: PORTABLE CHEST 1 VIEW COMPARISON:  CT 02/20/2018, radiograph 01/12/2018 FINDINGS: Portion of the right lung base is collimated from view. Blunting of the left costophrenic sulcus may reflect scarring or trace effusion. The lungs are hyperinflated compatible with emphysematous change seen on prior cross-sectional imaging. Chronic bronchitic changes are again noted. There are numerous surgical clips about the mediastinum compatible with history of prior CABG. Chart notes patient had sternal sutures removed previously. Prominence of the aortic arch corresponds to the type B aortic  dissection seen on prior angiography. No new consolidative process. No visible pneumothorax. No acute osseous or soft tissue abnormality. High-riding humeral heads likely reflect chronic rotator cuff insufficiency. IMPRESSION: 1. Trace left pleural effusion versus scarring. No new consolidative process. 2. Emphysematous and chronic bronchitic changes. 3. Prominence of the aortic arch corresponds to the patient's known type B aortic dissection Electronically Signed   By: Lovena Le M.D.   On: 11/02/2018 23:30   Ct Angio Chest/abd/pel For Dissection W And/or Wo Contrast  Result Date: 11/03/2018 CLINICAL DATA:  Chest pain dizziness fever history of dissection EXAM: CT ANGIOGRAPHY CHEST, ABDOMEN AND PELVIS TECHNIQUE: Multidetector CT imaging through the chest, abdomen and pelvis was performed using the standard protocol during bolus administration of intravenous contrast. Multiplanar  reconstructed images and MIPs were obtained and reviewed to evaluate the vascular anatomy. CONTRAST:  114mL OMNIPAQUE IOHEXOL 350 MG/ML SOLN COMPARISON:  Chest x-ray 11/02/2018, CT 09/01/2018, 02/20/2018, January 09, 2018, CT chest 01/05/2018 FINDINGS: CTA CHEST FINDINGS Cardiovascular: Non contrasted images of the chest demonstrate moderate aortic atherosclerosis. No acute intramural hematoma. Wall calcification related to known dissection again noted. Following contrast, redemonstrated type B aortic dissection originating just distal to the subclavian takeoff. Ascending aorta demonstrates normal caliber and no dissection flap. Coronary vascular calcifications. Normal heart size. No pericardial effusion. Mostly thrombosed false lumen with persistent saccular enhancement at the distal arch, though without significant change as compared with prior exams. Dilatation of the distal arch up to 4.6 cm maximum, stable as compared with 09/01/2018 and 02/20/2018. tapering of the descending thoracic aorta 2 maximum diameter 3.8 cm as before.  No acute mediastinal hematoma or stranding to suggest leakage or rupture. Mediastinum/Nodes: Midline trachea. No thyroid mass. No significant adenopathy. Esophagus within normal limits Lungs/Pleura: Emphysema. No acute consolidation, pleural effusion, or pneumothorax. Musculoskeletal: Degenerative changes. No acute or suspicious osseous abnormality. Review of the MIP images confirms the above findings. CTA ABDOMEN AND PELVIS FINDINGS VASCULAR Aorta: Chronic dissection to the iliac bifurcation. Aneurysmal dilatation of the infrarenal abdominal aorta measuring 4.8 x 4.4 cm, previously 4.9 x 4.4 cm. Thrombosed false lumen. Celiac: Arises from true lumen and appears patent. There may be mild narrowing at the origin. SMA: Patent without evidence of aneurysm, dissection, vasculitis or significant stenosis. Renals: Single right and single left renal arteries. Right renal artery is patent. Stenosis of the proximal left renal artery over a 1.2 cm length, similar compared to prior. IMA: Not well seen and probably occluded.  Distal reconstitution. Inflow: Mild to moderate focal stenosis of the right common iliac artery. Moderate disease of the right internal iliac artery. Right external iliac artery is patent. No significant stenosis left common iliac artery. Ectatic distally up to 1.5 cm. No dissection. Moderate disease of the left internal iliac artery. Left external iliac artery is patent. Veins: No obvious venous abnormality within the limitations of this arterial phase study. Review of the MIP images confirms the above findings. NON-VASCULAR Hepatobiliary: No focal liver abnormality is seen. No gallstones, gallbladder wall thickening, or biliary dilatation. Pancreas: Unremarkable. No pancreatic ductal dilatation or surrounding inflammatory changes. Spleen: Normal in size without focal abnormality. Adrenals/Urinary Tract: Adrenal glands are unremarkable. Kidneys are normal, without renal calculi, focal lesion, or  hydronephrosis. Bladder is unremarkable. Stomach/Bowel: Stomach is within normal limits. Appendix appears normal. No evidence of bowel wall thickening, distention, or inflammatory changes. Sigmoid colon diverticular disease without acute inflammatory change Lymphatic: No significantly enlarged lymph nodes Reproductive: Prostate is unremarkable. Other: Negative for free air or free fluid Musculoskeletal: Posterior stabilization rods and fixating screws L4 through S1. Interbody devices at L4-L5 and L5-S1. Review of the MIP images confirms the above findings. IMPRESSION: 1. Stable appearance of known type B aortic dissection with maximum dilatation of the distal aortic arch up to 4.6 cm. Recommend semi-annual imaging followup by CTA or MRA and referral to cardiothoracic surgery if not already obtained. This recommendation follows 2010 ACCF/AHA/AATS/ACR/ASA/SCA/SCAI/SIR/STS/SVM Guidelines for the Diagnosis and Management of Patients With Thoracic Aortic Disease. Circulation. 2010; 121JG:4281962. Aortic aneurysm NOS (ICD10-I71.9) 2. Stable 4.8 cm infrarenal abdominal aortic aneurysm. Recommend followup by abdomen and pelvis CTA in 6 months, and vascular surgery referral/consultation if not already obtained. This recommendation follows ACR consensus guidelines: White Paper of the ACR Incidental Findings Committee  II on Vascular Findings. J Am Coll Radiol 2013; 10:789-794. Aortic aneurysm NOS (ICD10-I71.9) 3. Emphysematous disease. Clear lung fields no CT evidence for acute intra-abdominal or pelvic abnormality Electronically Signed   By: Donavan Foil M.D.   On: 11/03/2018 02:54    Barton Dubois, MD  [facility] Pager [phone]  11/04/2018, 2:42 PM   LOS: 1 day

## 2018-11-05 ENCOUNTER — Inpatient Hospital Stay (HOSPITAL_COMMUNITY): Payer: Medicare HMO

## 2018-11-05 DIAGNOSIS — G894 Chronic pain syndrome: Secondary | ICD-10-CM

## 2018-11-05 DIAGNOSIS — J9621 Acute and chronic respiratory failure with hypoxia: Secondary | ICD-10-CM

## 2018-11-05 LAB — RESPIRATORY PANEL BY PCR

## 2018-11-05 MED ORDER — ALBUTEROL SULFATE HFA 108 (90 BASE) MCG/ACT IN AERS: 2.0000 | INHALATION_SPRAY | Freq: Four times a day (QID) | RESPIRATORY_TRACT | 2 refills | Status: DC | PRN

## 2018-11-05 MED ORDER — SPIRIVA HANDIHALER 18 MCG IN CAPS: 18.0000 ug | ORAL_CAPSULE | Freq: Every day | RESPIRATORY_TRACT | 2 refills | Status: DC

## 2018-11-05 MED ORDER — PREDNISONE 20 MG PO TABS: ORAL_TABLET | ORAL | 0 refills | Status: DC

## 2018-11-05 MED ORDER — TRAMADOL HCL 50 MG PO TABS: 100.0000 mg | ORAL_TABLET | Freq: Three times a day (TID) | ORAL | 0 refills | Status: AC | PRN

## 2018-11-05 MED ORDER — BENZONATATE 200 MG PO CAPS: 200.0000 mg | ORAL_CAPSULE | Freq: Three times a day (TID) | ORAL | 0 refills | Status: DC

## 2018-11-05 MED ORDER — AMOXICILLIN-POT CLAVULANATE 875-125 MG PO TABS: 1.0000 | ORAL_TABLET | Freq: Two times a day (BID) | ORAL | 0 refills | Status: AC

## 2018-11-05 NOTE — Progress Notes (Signed)
IV removed, 2x2 gauze and paper tape applied to site, patient tolerated well.  Reviewed AVS with patient who verbalized understanding. Patient to be transported to lobby via wheelchair on oxygen at 3 L/min via  and transported home by his wife.

## 2018-11-05 NOTE — Discharge Summary (Signed)
Physician Discharge Summary  [name] [id number] DOB: 11-19-1950 DOA: 11/02/2018  PCP: Wenda Low, MD  Admit date: 11/02/2018 Discharge date: 11/05/2018  Time spent: 35 minutes  Recommendations for Outpatient Follow-up:  1. Repeat basic metabolic panel to follow electrolytes and renal function 2. Reassess blood pressure and adjust antihypertensive regimen as required 3. Assess patient response to initiation of his palliative regarding stabilization of his COPD.   Discharge Diagnoses:  Principal Problem:   COPD with acute exacerbation ([place]) Active Problems:   Aortic dissection (HCC)   CAD (coronary artery disease)   COPD exacerbation (HCC)   Chronic respiratory failure with hypoxia (HCC)   SIRS (systemic inflammatory response syndrome) (HCC)   Anxiety   AAA (abdominal aortic aneurysm) without rupture (HCC)   Chronic pain syndrome   Discharge Condition: Stable and improved.  Patient discharged home with instruction to follow-up with PCP and pulmonology service as an outpatient.  Diet recommendation: Heart healthy diet.  Filed Weights   11/02/18 2310 11/03/18 1122  Weight: 74.8 kg 72.9 kg    History of present illness:  68 year old male with a history of COPD, chronic respiratory failure on 3 L, coronary disease, aortic dissection, anxiety, hypertension, alcohol abuse in remission, hyperlipidemia presenting with 1 month history of shortness of breath that was significantly worsened in the past 1 to 2 days prior to admission.  He complains of worsening cough to the point of having posttussive emesis.  He denies any hemoptysis.  He states that he had a temperature up to 105.0 F at home.  He denied any chest pain, abdominal pain, dysuria, hematochezia, melena, diarrhea.  He quit smoking 1 year ago after 40-pack-year history.  He denies worsening lower extremity edema orthopnea.  In the emergency department, the patient had low-grade temperature 100.1 F with his mildly  tachycardic 100-110, with oxygen saturation 98-100% on 3.5 L.  He was hemodynamically stable.  WBC was 13.8, otherwise BMP, LFTs, and CBC were unremarkable.  CTA chest/abd showed a stable type B aortic dissection with maximal dilatation 4.6 cm.  There was also a stable infrarenal AAA at 4.8 cm.  Lungs showed emphysema without any consolidation or pleural effusions.  Hospital Course:  COPD exacerbation -Stabilize and ready for outpatient therapy at discharge. -Continue steroids tapering, PRN albuterol, resumption of his trilogy inhaler and a starting use of Spiriva. -Continue use of flutter valve as instructed. -Patient will complete antibiotic therapy and continue using Tessalon Perles to assist with coughing spells.  Dysphagia -Speech therapy has been consulted and MBSS performed; mild aspiration risk appreciated, no need to adjust diet consistency currently -continue PPI and follow mitigating instructions by speech therapy while eating/drinking.   Chronic respiratory failure with hypoxia -Patient is on and off 2.5-3 L at home -Currently stable on 3 L supplementation -will discharge on supplementation with intention to maintain O2 sat above 89%. -CT angiogram chest without acute infiltrates/opacities and negative for pulmonary embolism.  SIRS -Follow final blood cultures results -UA negative for pyuria and patient denying dysuria. -CT chest negative for infiltrates or consolidation -CT abdomen and pelvis negative for acute findings as well. -Continue supportive care and treatment for COPD exacerbation. -Sepsis rule out.  Coronary artery disease -No chest pain presently -Continue aspirin and Plavix -Continue metoprolol tartrate -Continue statin -09/20/18 cath--patent LIMA-LAD, 80% stenosis in the SVG-Diag with PCI/DESx1, occluded SVG-RCA with PCI/DES to the native RCA x3. Plan for DAPT with ASA/Plavixfor at least one year -Continue outpatient follow-up with cardiology service as  previously scheduled.  Chronic back pain -Continue home dose of gabapentin -Tramadol dose has been adjusted for better pain control, to 100 mg every 8 hours as needed..  Anxiety -Continue home dose alprazolam, trazodone, BuSpar -Mood is stable. -No suicidal ideation hallucination.  Essential hypertension -Resume metoprolol; amlodipine discontinued at time of discharge. -Vital signs stable and blood pressure well controlled. -Continue heart healthy diet.  Hypothyroidism -Continue Synthroid  Hyperlipidemia -Continue statin -Heart healthy diet encouraged.  Aortic dissection/infrarenal AAA - Both appear stable on CTA performed on this admission. - He already has outpatient vascular surgery follow-up arranged   Procedures:  See below for x-ray reports.  Consultations:  None  Discharge Exam: Vitals:   11/05/18 1343 11/05/18 1431  BP: 110/74   Pulse: 74   Resp: 20   Temp: 98.3 F (36.8 C)   SpO2: 97% 97%    General: Speaking in full sentences, denies chest pain, no nausea, no vomiting.  Breathing significantly improved.  Good oxygen saturation on 3 L nasal cannula supplementation. Cardiovascular: S1 and S2, no rubs, no gallops, no JVD. Respiratory: Improved air movement bilaterally, mild expiratory wheezing, positive rhonchi right, no using accessory muscles, good oxygen saturation on 3 L nasal cannula supplementation and normal respiratory effort. Abdomen: Soft, nontender: Distended, positive bowel sounds Extremities: No cyanosis, no clubbing.  No edema.  Discharge Instructions   Discharge Instructions    Diet - low sodium heart healthy   Complete by: As directed    Discharge instructions   Complete by: As directed    Take medications as prescribed Use oxygen supplementation as instructed (3 L to maintain oxygen saturation above 89-90%). Arrange follow-up with PCP in 10 days Maintain adequate hydration Follow heart healthy diet.     Allergies as of  11/05/2018      Reactions   Nsaids Shortness Of Breath   Bee Venom Swelling   Neck swelling, passes out   Codeine Other (See Comments)   Causes GI Upset      Medication List    STOP taking these medications   amLODipine 5 MG tablet Commonly known as: NORVASC     TAKE these medications   albuterol (2.5 MG/3ML) 0.083% nebulizer solution Commonly known as: PROVENTIL Take 3 mLs (2.5 mg total) by nebulization every 6 (six) hours as needed for wheezing or shortness of breath.   albuterol 108 (90 Base) MCG/ACT inhaler Commonly known as: Ventolin HFA Inhale 2 puffs into the lungs every 6 (six) hours as needed for wheezing or shortness of breath.   ALPRAZolam 0.25 MG tablet Commonly known as: Xanax Take 1 tablet (0.25 mg total) by mouth 2 (two) times daily as needed for anxiety.   amoxicillin-clavulanate 875-125 MG tablet Commonly known as: AUGMENTIN Take 1 tablet by mouth every 12 (twelve) hours for 5 days.   aspirin EC 81 MG tablet Take 1 tablet (81 mg total) by mouth daily.   atorvastatin 40 MG tablet Commonly known as: LIPITOR Take 1 tablet (40 mg total) by mouth daily.   benzonatate 200 MG capsule Commonly known as: TESSALON Take 1 capsule (200 mg total) by mouth 3 (three) times daily.   busPIRone 7.5 MG tablet Commonly known as: BUSPAR Take 1 tablet (7.5 mg total) by mouth 3 (three) times daily.   clopidogrel 75 MG tablet Commonly known as: Plavix Take 1 tablet (75 mg total) by mouth daily.   feeding supplement (ENSURE ENLIVE) Liqd Take 237 mLs by mouth 2 (two) times daily between meals.   gabapentin 300 MG capsule  Commonly known as: NEURONTIN Take 300 mg by mouth 3 (three) times daily.   levothyroxine 50 MCG tablet Commonly known as: SYNTHROID Take 50 mcg by mouth daily.   metoprolol tartrate 25 MG tablet Commonly known as: LOPRESSOR Take 1 tablet (25 mg total) by mouth 3 (three) times daily.   nitroGLYCERIN 0.4 MG SL tablet Commonly known as:  NITROSTAT Place 1 tablet (0.4 mg total) under the tongue every 5 (five) minutes as needed for chest pain.   OXYGEN Inhale 3.5 L into the lungs daily as needed (for shortness of breath). 3-3.5lpm 24/7   pantoprazole 40 MG tablet Commonly known as: PROTONIX Take 1 tablet (40 mg total) by mouth at bedtime.   predniSONE 20 MG tablet Commonly known as: DELTASONE Take 3 tablets by mouth daily x1 day; then 2 tablets by mouth daily x2 days; then 1 tablet by mouth daily x3 days; then half tab by mouth daily x3 days and stop prednisone.   Spiriva HandiHaler 18 MCG inhalation capsule Generic drug: tiotropium Place 1 capsule (18 mcg total) into inhaler and inhale daily.   traMADol 50 MG tablet Commonly known as: ULTRAM Take 2 tablets (100 mg total) by mouth every 8 (eight) hours as needed for severe pain. What changed:   when to take this  reasons to take this   traZODone 100 MG tablet Commonly known as: DESYREL Take 1 tablet (100 mg total) by mouth at bedtime as needed for sleep. What changed: when to take this   Trelegy Ellipta 100-62.5-25 MCG/INH Aepb Generic drug: Fluticasone-Umeclidin-Vilant Inhale 1 puff into the lungs daily.      Allergies  Allergen Reactions  . Nsaids Shortness Of Breath  . Bee Venom Swelling    Neck swelling, passes out  . Codeine Other (See Comments)    Causes GI Upset   Follow-up Information    Wenda Low, MD. Schedule an appointment as soon as possible for a visit in 10 day(s).   Specialty: Internal Medicine Contact information: [address] [place] [address] [phone]        Jerline Pain, MD .   Specialty: Cardiology Contact information: [id number] [address]. [address] [place] [address] [phone]            The results of significant diagnostics from this hospitalization (including imaging, microbiology, ancillary and laboratory) are listed below for reference.    Significant Diagnostic  Studies: Ct Head Wo Contrast  Result Date: 11/03/2018 CLINICAL DATA:  Central vertigo, fever of 103.7, wheezing EXAM: CT HEAD WITHOUT CONTRAST TECHNIQUE: Contiguous axial images were obtained from the base of the skull through the vertex without intravenous contrast. COMPARISON:  CT 01/08/2018 FINDINGS: Brain: No evidence of acute infarction, hemorrhage, hydrocephalus, extra-axial collection or mass lesion/mass effect. Symmetric prominence of the ventricles, cisterns and sulci compatible with parenchymal volume loss. Patchy areas of white matter hypoattenuation are most compatible with chronic microvascular angiopathy. Vascular: Atherosclerotic calcification of the carotid siphons and intradural vertebral arteries. No hyperdense vessel. Skull: No calvarial fracture or suspicious osseous lesion. No scalp swelling or hematoma. Sinuses/Orbits: Paranasal sinuses and mastoid air cells are predominantly clear. Included orbital structures are unremarkable. Other: None IMPRESSION: 1. No acute intracranial abnormality. 2. Generalized parenchymal volume loss and chronic microvascular ischemic white matter disease. Electronically Signed   By: Lovena Le M.D.   On: 11/03/2018 02:31   Dg Chest Port 1 View  Result Date: 11/02/2018 CLINICAL DATA:  Cough, fever EXAM: PORTABLE CHEST 1 VIEW  COMPARISON:  CT 02/20/2018, radiograph 01/12/2018 FINDINGS: Portion of the right lung base is collimated from view. Blunting of the left costophrenic sulcus may reflect scarring or trace effusion. The lungs are hyperinflated compatible with emphysematous change seen on prior cross-sectional imaging. Chronic bronchitic changes are again noted. There are numerous surgical clips about the mediastinum compatible with history of prior CABG. Chart notes patient had sternal sutures removed previously. Prominence of the aortic arch corresponds to the type B aortic dissection seen on prior angiography. No new consolidative process. No visible  pneumothorax. No acute osseous or soft tissue abnormality. High-riding humeral heads likely reflect chronic rotator cuff insufficiency. IMPRESSION: 1. Trace left pleural effusion versus scarring. No new consolidative process. 2. Emphysematous and chronic bronchitic changes. 3. Prominence of the aortic arch corresponds to the patient's known type B aortic dissection Electronically Signed   By: Lovena Le M.D.   On: 11/02/2018 23:30   Dg Swallowing Func-speech Pathology  Result Date: 11/05/2018 Objective Swallowing Evaluation: Type of Study: MBS-Modified Barium Swallow Study  Patient Details Name: EMERZON VANDERWOOD MRN: [id number] Date of Birth: [birth year] Today's Date: 11/05/2018 Time: SLP Start Time (ACUTE ONLY): [id number] -SLP Stop Time (ACUTE ONLY): [id number] SLP Time Calculation (min) (ACUTE ONLY): 24 min Past Medical History: Past Medical History: Diagnosis Date . Anemia  . Anxiety  . Aortic dissection (HCC)   TYPE 3 . CAD (coronary artery disease)   multiple, most recently Jan-March 2017 . Cardiac tamponade  . Chronic bronchitis ([place])  . Chronic fatigue  . Chronic pain  . Constipation  . COPD (chronic obstructive pulmonary disease) ([place])  . Depression  . Dysphagia, oropharyngeal phase  . ETOH abuse  . GERD (gastroesophageal reflux disease)  . Hiatal hernia  . HTN (hypertension)  . Hypercholesterolemia  . Metabolic encephalopathy  . Opioid abuse ([place])  . Peptic ulcer disease 08/2010  EGD  . PNA (pneumonia)  . Schatzki's ring  Past Surgical History: Past Surgical History: Procedure Laterality Date . BACK SURGERY   . CABG X 6  07/31/2007  [facility] . CARDIAC CATHETERIZATION   . CARDIAC CATHETERIZATION N/A 03/16/2015  Procedure: Coronary/Graft Angiography;  Surgeon: Sherren Mocha, MD;  Location: [facility];  Service: Cardiovascular;  Laterality: N/A; . CARDIAC CATHETERIZATION N/A 03/16/2015  Procedure: Coronary Stent Intervention;  Surgeon: Sherren Mocha, MD;  Location: [facility];  Service: Cardiovascular;   Laterality: N/A; . CORONARY STENT INTERVENTION N/A 09/21/2018  Procedure: CORONARY STENT INTERVENTION;  Surgeon: Jettie Booze, MD;  Location: [facility];  Service: Cardiovascular;  Laterality: N/A; . KNEE SURGERY Right   acl . LEFT HEART CATH AND CORS/GRAFTS ANGIOGRAPHY N/A 09/21/2018  Procedure: LEFT HEART CATH AND CORS/GRAFTS ANGIOGRAPHY;  Surgeon: Jettie Booze, MD;  Location: [facility];  Service: Cardiovascular;  Laterality: N/A; . OLECRANON BURSECTOMY Left 08/27/2012  Procedure: EXCISION LEFT OLECRANON BURSA;  Surgeon: Sanjuana Kava, MD;  Location: [facility];  Service: Orthopedics;  Laterality: Left; . OLECRANON BURSECTOMY Left 05/25/2013  Procedure:  Excision sinus tract and tissue left elbow ;  Surgeon: Sanjuana Kava, MD;  Location: [facility];  Service: Orthopedics;  Laterality: Left; . ORIF SCAPULAR FRACTURE Right  . SHOULDER SURGERY Left  . STERNAL WIRE REMOVEAL  12/31/2007  [facility] HPI: 68 year old male with a history of COPD, chronic respiratory failure on 3 L, coronary disease, aortic dissection, anxiety, hypertension, alcohol abuse in remission, hyperlipidemia presenting with 1 month history of shortness of breath that was significantly worsened in the past 1  to 2 days prior to admission.  He complains of worsening cough to the point of having posttussive emesis.  He states that he had a temperature up to 105.0 F at home.  He quit smoking 1 year ago after 40-pack-year history.   In the emergency department, the patient had low-grade temperature 100.1 F with his mildly tachycardic 100-110, with oxygen saturation 98-100% on 3.5 L.  He was hemodynamically stable.  WBC was 13.8, otherwise BMP, LFTs, and CBC were unremarkable.  CTA chest/abd showed a stable type B aortic dissection with maximal dilatation 4.6 cm.  There was also a stable infrarenal AAA at 4.8 cm.  Lungs showed emphysema without any consolidation or pleural effusions. BSE completed yesterday 11/04/2018 and Pt  presented with overt coughing prior to PO and during/after PO; MBSS recommended in order to objectively assess swallowing function.  Subjective: "I cough after I eat and drink." Assessment / Plan / Recommendation CHL IP CLINICAL IMPRESSIONS 11/05/2018 Clinical Impression  Pt presents with oropharyngeal functioning to be within functional limits. No aspiration was visualized with any consistencies or textures presented (thin, puree, regular and barium tablet with thin liquids). Pt demonstrated good hyolaryngeal excursion, good laryngeal vestibule closure and consistently adequate airway protection throughout the study. Pt had multiple coughing episodes during the study and one or two prior to any PO administration; coughing episodes were inconsistent and did not seem to be related to swallowing function. Visualized mild BOT/oral residue after initial swallow of thin liquids with a straw that dripped down the level of the pyriforms at which time Pt triggered a reflexive repeat swallow clearing all residue; also note flash penetration with consecutive straw sips of thin liquids. Question mild residue dripping into valleculae and pyriforms to be irritating or possibly contributing to coughing. SLP provided education of the importance of utilizing a slow rate to coordinate respiration and swallowing, recommendation to always provide an additional swallow and reviewed universal aspiration precautions. There are no further ST needs noted at this time. ST to sign off.  SLP Visit Diagnosis Dysphagia, unspecified (R13.10) Attention and concentration deficit following -- Frontal lobe and executive function deficit following -- Impact on safety and function Mild aspiration risk   CHL IP TREATMENT RECOMMENDATION 11/05/2018 Treatment Recommendations No treatment recommended at this time   Prognosis 11/05/2018 Prognosis for Safe Diet Advancement Good Barriers to Reach Goals -- Barriers/Prognosis Comment -- CHL IP DIET  RECOMMENDATION 11/05/2018 SLP Diet Recommendations Regular solids;Thin liquid Liquid Administration via Cup;Straw Medication Administration Whole meds with liquid Compensations Slow rate;Small sips/bites Postural Changes Remain semi-upright after after feeds/meals (Comment);Seated upright at 90 degrees   CHL IP OTHER RECOMMENDATIONS 11/04/2018 Recommended Consults -- Oral Care Recommendations -- Other Recommendations Clarify dietary restrictions   CHL IP FOLLOW UP RECOMMENDATIONS 11/05/2018 Follow up Recommendations None   CHL IP FREQUENCY AND DURATION 11/04/2018 Speech Therapy Frequency (ACUTE ONLY) min 1 x/week Treatment Duration 1 week      CHL IP ORAL PHASE 11/05/2018 Oral Phase Impaired Oral - Pudding Teaspoon -- Oral - Pudding Cup -- Oral - Honey Teaspoon -- Oral - Honey Cup -- Oral - Nectar Teaspoon -- Oral - Nectar Cup -- Oral - Nectar Straw -- Oral - Thin Teaspoon WFL Oral - Thin Cup WFL Oral - Thin Straw Premature spillage Oral - Puree WFL Oral - Mech Soft -- Oral - Regular WFL Oral - Multi-Consistency -- Oral - Pill WFL Oral Phase - Comment --  CHL IP PHARYNGEAL PHASE 11/05/2018 Pharyngeal Phase Impaired Pharyngeal- Pudding Teaspoon --  Pharyngeal -- Pharyngeal- Pudding Cup -- Pharyngeal -- Pharyngeal- Honey Teaspoon -- Pharyngeal -- Pharyngeal- Honey Cup -- Pharyngeal -- Pharyngeal- Nectar Teaspoon -- Pharyngeal -- Pharyngeal- Nectar Cup -- Pharyngeal -- Pharyngeal- Nectar Straw -- Pharyngeal -- Pharyngeal- Thin Teaspoon WFL Pharyngeal -- Pharyngeal- Thin Cup WFL Pharyngeal -- Pharyngeal- Thin Straw Pharyngeal residue - valleculae;Delayed swallow initiation-vallecula Pharyngeal -- Pharyngeal- Puree Delayed swallow initiation-vallecula Pharyngeal -- Pharyngeal- Mechanical Soft -- Pharyngeal -- Pharyngeal- Regular Delayed swallow initiation-vallecula Pharyngeal -- Pharyngeal- Multi-consistency -- Pharyngeal -- Pharyngeal- Pill Delayed swallow initiation-vallecula Pharyngeal -- Pharyngeal Comment --  CHL IP  CERVICAL ESOPHAGEAL PHASE 11/05/2018 Cervical Esophageal Phase WFL Pudding Teaspoon -- Pudding Cup -- Honey Teaspoon -- Honey Cup -- Nectar Teaspoon -- Nectar Cup -- Nectar Straw -- Thin Teaspoon -- Thin Cup -- Thin Straw -- Puree -- Mechanical Soft -- Regular -- Multi-consistency -- Pill -- Cervical Esophageal Comment -- Amelia H. Izora Ribas MA, CCC-SLP Speech Language Pathologist Wende Bushy 11/05/2018, 1:43 PM              Ct Angio Chest/abd/pel For Dissection W And/or Wo Contrast  Result Date: 11/03/2018 CLINICAL DATA:  Chest pain dizziness fever history of dissection EXAM: CT ANGIOGRAPHY CHEST, ABDOMEN AND PELVIS TECHNIQUE: Multidetector CT imaging through the chest, abdomen and pelvis was performed using the standard protocol during bolus administration of intravenous contrast. Multiplanar reconstructed images and MIPs were obtained and reviewed to evaluate the vascular anatomy. CONTRAST:  1110mL OMNIPAQUE IOHEXOL 350 MG/ML SOLN COMPARISON:  Chest x-ray 11/02/2018, CT 09/01/2018, 02/20/2018, January 09, 2018, CT chest 01/05/2018 FINDINGS: CTA CHEST FINDINGS Cardiovascular: Non contrasted images of the chest demonstrate moderate aortic atherosclerosis. No acute intramural hematoma. Wall calcification related to known dissection again noted. Following contrast, redemonstrated type B aortic dissection originating just distal to the subclavian takeoff. Ascending aorta demonstrates normal caliber and no dissection flap. Coronary vascular calcifications. Normal heart size. No pericardial effusion. Mostly thrombosed false lumen with persistent saccular enhancement at the distal arch, though without significant change as compared with prior exams. Dilatation of the distal arch up to 4.6 cm maximum, stable as compared with 09/01/2018 and 02/20/2018. tapering of the descending thoracic aorta 2 maximum diameter 3.8 cm as before. No acute mediastinal hematoma or stranding to suggest leakage or rupture.  Mediastinum/Nodes: Midline trachea. No thyroid mass. No significant adenopathy. Esophagus within normal limits Lungs/Pleura: Emphysema. No acute consolidation, pleural effusion, or pneumothorax. Musculoskeletal: Degenerative changes. No acute or suspicious osseous abnormality. Review of the MIP images confirms the above findings. CTA ABDOMEN AND PELVIS FINDINGS VASCULAR Aorta: Chronic dissection to the iliac bifurcation. Aneurysmal dilatation of the infrarenal abdominal aorta measuring 4.8 x 4.4 cm, previously 4.9 x 4.4 cm. Thrombosed false lumen. Celiac: Arises from true lumen and appears patent. There may be mild narrowing at the origin. SMA: Patent without evidence of aneurysm, dissection, vasculitis or significant stenosis. Renals: Single right and single left renal arteries. Right renal artery is patent. Stenosis of the proximal left renal artery over a 1.2 cm length, similar compared to prior. IMA: Not well seen and probably occluded.  Distal reconstitution. Inflow: Mild to moderate focal stenosis of the right common iliac artery. Moderate disease of the right internal iliac artery. Right external iliac artery is patent. No significant stenosis left common iliac artery. Ectatic distally up to 1.5 cm. No dissection. Moderate disease of the left internal iliac artery. Left external iliac artery is patent. Veins: No obvious venous abnormality within the limitations of this arterial phase study. Review of the  MIP images confirms the above findings. NON-VASCULAR Hepatobiliary: No focal liver abnormality is seen. No gallstones, gallbladder wall thickening, or biliary dilatation. Pancreas: Unremarkable. No pancreatic ductal dilatation or surrounding inflammatory changes. Spleen: Normal in size without focal abnormality. Adrenals/Urinary Tract: Adrenal glands are unremarkable. Kidneys are normal, without renal calculi, focal lesion, or hydronephrosis. Bladder is unremarkable. Stomach/Bowel: Stomach is within normal  limits. Appendix appears normal. No evidence of bowel wall thickening, distention, or inflammatory changes. Sigmoid colon diverticular disease without acute inflammatory change Lymphatic: No significantly enlarged lymph nodes Reproductive: Prostate is unremarkable. Other: Negative for free air or free fluid Musculoskeletal: Posterior stabilization rods and fixating screws L4 through S1. Interbody devices at L4-L5 and L5-S1. Review of the MIP images confirms the above findings. IMPRESSION: 1. Stable appearance of known type B aortic dissection with maximum dilatation of the distal aortic arch up to 4.6 cm. Recommend semi-annual imaging followup by CTA or MRA and referral to cardiothoracic surgery if not already obtained. This recommendation follows 2010 ACCF/AHA/AATS/ACR/ASA/SCA/SCAI/SIR/STS/SVM Guidelines for the Diagnosis and Management of Patients With Thoracic Aortic Disease. Circulation. 2010; [id number]. Aortic aneurysm NOS (ICD10-I71.9) 2. Stable 4.8 cm infrarenal abdominal aortic aneurysm. Recommend followup by abdomen and pelvis CTA in 6 months, and vascular surgery referral/consultation if not already obtained. This recommendation follows ACR consensus guidelines: White Paper of the ACR Incidental Findings Committee II on Vascular Findings. J Am Coll Radiol 2013; 10:789-794. Aortic aneurysm NOS (ICD10-I71.9) 3. Emphysematous disease. Clear lung fields no CT evidence for acute intra-abdominal or pelvic abnormality Electronically Signed   By: Donavan Foil M.D.   On: 11/03/2018 02:54    Microbiology: Recent Results (from the past 240 hour(s))  SARS Coronavirus 2 by RT PCR (hospital order, performed in [facility] hospital lab) Nasopharyngeal Nasopharyngeal Swab     Status: None   Collection Time: 11/02/18 11:07 PM   Specimen: Nasopharyngeal Swab  Result Value Ref Range Status   SARS Coronavirus 2 NEGATIVE NEGATIVE Final    Comment: (NOTE) If result is NEGATIVE SARS-CoV-2 target nucleic acids  are NOT DETECTED. The SARS-CoV-2 RNA is generally detectable in upper and lower  respiratory specimens during the acute phase of infection. The lowest  concentration of SARS-CoV-2 viral copies this assay can detect is 250  copies / mL. A negative result does not preclude SARS-CoV-2 infection  and should not be used as the sole basis for treatment or other  patient management decisions.  A negative result may occur with  improper specimen collection / handling, submission of specimen other  than nasopharyngeal swab, presence of viral mutation(s) within the  areas targeted by this assay, and inadequate number of viral copies  (<250 copies / mL). A negative result must be combined with clinical  observations, patient history, and epidemiological information. If result is POSITIVE SARS-CoV-2 target nucleic acids are DETECTED. The SARS-CoV-2 RNA is generally detectable in upper and lower  respiratory specimens dur ing the acute phase of infection.  Positive  results are indicative of active infection with SARS-CoV-2.  Clinical  correlation with patient history and other diagnostic information is  necessary to determine patient infection status.  Positive results do  not rule out bacterial infection or co-infection with other viruses. If result is PRESUMPTIVE POSTIVE SARS-CoV-2 nucleic acids MAY BE PRESENT.   A presumptive positive result was obtained on the submitted specimen  and confirmed on repeat testing.  While 2019 novel coronavirus  (SARS-CoV-2) nucleic acids may be present in the submitted sample  additional confirmatory testing  may be necessary for epidemiological  and / or clinical management purposes  to differentiate between  SARS-CoV-2 and other Sarbecovirus currently known to infect humans.  If clinically indicated additional testing with an alternate test  methodology [phone]) is advised. The SARS-CoV-2 RNA is generally  detectable in upper and lower respiratory sp ecimens  during the acute  phase of infection. The expected result is Negative. Fact Sheet for Patients:  StrictlyIdeas.no Fact Sheet for Healthcare Providers: BankingDealers.co.za This test is not yet approved or cleared by the [place] FDA and has been authorized for detection and/or diagnosis of SARS-CoV-2 by FDA under an Emergency Use Authorization (EUA).  This EUA will remain in effect (meaning this test can be used) for the duration of the COVID-19 declaration under Section 564(b)(1) of the Act, 21 U.S.C. section 360bbb-3(b)(1), unless the authorization is terminated or revoked sooner. Performed at [facility], [address], [place], [place] [address]   Urine culture     Status: None   Collection Time: 11/02/18 11:19 PM   Specimen: In/Out Cath Urine  Result Value Ref Range Status   Specimen Description   Final    IN/OUT CATH URINE Performed at [facility], [address] [place], [place] [address]    Special Requests   Final    NONE Performed at [facility], [address], [place], [place] [address]    Culture   Final    NO GROWTH Performed at [facility], [place] [address] [place], [place] [address]    Report Status 11/04/2018 FINAL  Final  Blood Culture (routine x 2)     Status: None (Preliminary result)   Collection Time: 11/02/18 11:25 PM   Specimen: BLOOD  Result Value Ref Range Status   Specimen Description BLOOD RIGHT ANTECUBITAL  Final   Special Requests   Final    BOTTLES DRAWN AEROBIC AND ANAEROBIC Blood Culture adequate volume   Culture   Final    NO GROWTH 3 DAYS Performed at [facility], [address] [place], [place] [address]    Report Status PENDING  Incomplete  Blood Culture (routine x 2)     Status: None (Preliminary result)   Collection Time: 11/02/18 11:34 PM   Specimen: BLOOD  Result Value Ref Range Status   Specimen Description BLOOD RIGHT ANTECUBITAL  Final   Special Requests   Final     BOTTLES DRAWN AEROBIC AND ANAEROBIC Blood Culture adequate volume   Culture   Final    NO GROWTH 3 DAYS Performed at [facility], [address], [place],  [address]    Report Status PENDING  Incomplete     Labs: Basic Metabolic Panel: Recent Labs  Lab 11/02/18 2334 11/04/18 0512  NA 140 138  K 3.6 4.1  CL 101 105  CO2 28 26  GLUCOSE 103* 176*  BUN 11 16  CREATININE 0.83 0.71  CALCIUM 8.5* 8.2*   Liver Function Tests: Recent Labs  Lab 11/02/18 2334  AST 15  ALT 11  ALKPHOS 50  BILITOT 0.5  PROT 6.8  ALBUMIN 4.0   CBC: Recent Labs  Lab 11/02/18 2334 11/04/18 0512  WBC 13.8* 14.9*  NEUTROABS 12.6* 13.9*  HGB 13.2 11.0*  HCT 40.8 33.5*  MCV 102.3* 103.4*  PLT 190 163   BNP (last 3 results) Recent Labs    01/05/18 1226  BNP 422.0*   CBG: Recent Labs  Lab 11/03/18 1118  GLUCAP 113*    Signed:  Barton Dubois  MD.  [facility] 11/05/2018, 2:47 PM

## 2018-11-05 NOTE — Evaluation (Signed)
Modified Barium Swallow Progress Note  Patient Details  Name: [name] MRN: [id number] Date of Birth: 1950-10-31  Today's Date: 11/05/2018  Modified Barium Swallow completed.  Full report located under Chart Review in the Imaging Section.  Brief recommendations include the following:  Clinical Impression  Pt presents with oropharyngeal functioning to be within functional limits. No aspiration was visualized with any consistencies or textures presented (thin, puree, regular and barium tablet with thin liquids). Pt demonstrated good hyolaryngeal excursion, good laryngeal vestibule closure and consistently adequate airway protection throughout the study. Pt had multiple coughing episodes during the study and one or two prior to any PO administration; coughing episodes were inconsistent and did not seem to be related to swallowing function. Visualized mild BOT/oral residue after initial swallow of thin liquids with a straw that dripped down the level of the pyriforms at which time Pt triggered a reflexive repeat swallow clearing all residue; also note flash penetration with consecutive straw sips of thin liquids. Question mild residue dripping into valleculae and pyriforms to be irritating or possibly contributing to coughing. SLP provided education of the importance of utilizing a slow rate to coordinate respiration and swallowing, recommendation to always provide an additional swallow and reviewed universal aspiration precautions. There are no further ST needs noted at this time. ST to sign off.    Swallow Evaluation Recommendations       SLP Diet Recommendations: Regular solids;Thin liquid   Liquid Administration via: Cup;Straw   Medication Administration: Whole meds with liquid   Supervision: Patient able to self feed;Intermittent supervision to cue for compensatory strategies   Compensations: Slow rate;Small sips/bites   Postural Changes: Remain semi-upright after after feeds/meals  (Comment);Seated upright at 90 degrees          [name], [place] Speech Language Pathologist    Wende Bushy 11/05/2018,1:35 PM

## 2018-11-07 LAB — CULTURE, BLOOD (ROUTINE X 2)
Culture: NO GROWTH
Culture: NO GROWTH
Special Requests: ADEQUATE
Special Requests: ADEQUATE

## 2018-11-12 ENCOUNTER — Other Ambulatory Visit: Payer: Self-pay

## 2018-11-12 ENCOUNTER — Other Ambulatory Visit: Payer: Self-pay | Admitting: Pharmacist

## 2018-11-12 DIAGNOSIS — J449 Chronic obstructive pulmonary disease, unspecified: Secondary | ICD-10-CM | POA: Diagnosis not present

## 2018-11-12 DIAGNOSIS — R6889 Other general symptoms and signs: Secondary | ICD-10-CM | POA: Diagnosis not present

## 2018-11-12 DIAGNOSIS — F411 Generalized anxiety disorder: Secondary | ICD-10-CM | POA: Diagnosis not present

## 2018-11-12 DIAGNOSIS — J441 Chronic obstructive pulmonary disease with (acute) exacerbation: Secondary | ICD-10-CM | POA: Diagnosis not present

## 2018-11-12 NOTE — Patient Outreach (Addendum)
[facility]) Care Management  [employer]  11/12/2018   [name] 1950/08/24 [id number]  Initial Assessment   EMMI-General Discharge RED ON EMMI ALERT Day # 4 Date: 11/12/2018 Red Alert Reason: " Lost interest in things/ Yes  Sad/hopeless/anxious/empty? Yes"   Outreach attempt #1 to patient. Spoke with patient who reports he is doing fairly well. Reviewed and addressed red alerts with patient. Patient states that he misses not being able to do the things he normally could do such as go to work. He reports that he has been feeling somewhat sad and down  but nothing outside of his usual mood/state. PHQ-2 completed with patient. He lives with supportive spouse. Patient voices that he uses [place] transportation to get to appts.   Conditions: Per chart review, patient ha PMH that includes but not limited to COPD, CAD, aortic dissection, SIRS, anxiety,AAA,chronic pain syndrome, HLD, HTN and hypothyroidism. Patient was recntly hospitalized from 11/02/2018 to 11/05/18 for COPD exacerbation(PCP office does TOC). Patient reports that he is using oxygen at 3.5L/min via [place]. He reports that he is unable to walk to the mailbox without having to stop and rest and without SOB. He is using neb txs to help and voices some relief.   Appointments: Patient goes for PCP follow up appt this afternoon.  Medications: Med review completed with patient. Patient voices that he was started on three inhalers (Trelegy,Spirivia and Albuterol) at time of hospital discharge. However, he was unable to afford co-pays for meds and does not have them in the home. Patient has not notified MD of inability to affords meds. RN CM instructed patient to do so and to inquire if any samples available. He will follow up with MD at appt today.   Encounter Medications:  Outpatient Encounter Medications as of 11/12/2018  Medication Sig  . albuterol (PROVENTIL) (2.5 MG/3ML) 0.083% nebulizer solution Take 3 mLs  (2.5 mg total) by nebulization every 6 (six) hours as needed for wheezing or shortness of breath.  . ALPRAZolam (XANAX) 0.25 MG tablet Take 1 tablet (0.25 mg total) by mouth 2 (two) times daily as needed for anxiety.  Marland Kitchen aspirin EC 81 MG tablet Take 1 tablet (81 mg total) by mouth daily.  Marland Kitchen atorvastatin (LIPITOR) 40 MG tablet Take 1 tablet (40 mg total) by mouth daily.  . benzonatate (TESSALON) 200 MG capsule Take 1 capsule (200 mg total) by mouth 3 (three) times daily.  . busPIRone (BUSPAR) 7.5 MG tablet Take 1 tablet (7.5 mg total) by mouth 3 (three) times daily.  . clopidogrel (PLAVIX) 75 MG tablet Take 1 tablet (75 mg total) by mouth daily.  Marland Kitchen gabapentin (NEURONTIN) 300 MG capsule Take 300 mg by mouth 3 (three) times daily.  Marland Kitchen levothyroxine (SYNTHROID, LEVOTHROID) 50 MCG tablet Take 50 mcg by mouth daily.  . metoprolol tartrate (LOPRESSOR) 25 MG tablet Take 1 tablet (25 mg total) by mouth 3 (three) times daily.  . nitroGLYCERIN (NITROSTAT) 0.4 MG SL tablet Place 1 tablet (0.4 mg total) under the tongue every 5 (five) minutes as needed for chest pain.  . OXYGEN Inhale 3.5 L into the lungs daily as needed (for shortness of breath). 3-3.5lpm 24/7   . pantoprazole (PROTONIX) 40 MG tablet Take 1 tablet (40 mg total) by mouth at bedtime.  . predniSONE (DELTASONE) 20 MG tablet Take 3 tablets by mouth daily x1 day; then 2 tablets by mouth daily x2 days; then 1 tablet by mouth daily x3 days; then half tab by  mouth daily x3 days and stop prednisone.  . traMADol (ULTRAM) 50 MG tablet Take 2 tablets (100 mg total) by mouth every 8 (eight) hours as needed for severe pain.  . traZODone (DESYREL) 100 MG tablet Take 1 tablet (100 mg total) by mouth at bedtime as needed for sleep. (Patient taking differently: Take 100 mg by mouth at bedtime. )  . albuterol (VENTOLIN HFA) 108 (90 Base) MCG/ACT inhaler Inhale 2 puffs into the lungs every 6 (six) hours as needed for wheezing or shortness of breath. (Patient not  taking: Reported on 11/12/2018)  . feeding supplement, ENSURE ENLIVE, (ENSURE ENLIVE) LIQD Take 237 mLs by mouth 2 (two) times daily between meals.  . Fluticasone-Umeclidin-Vilant (TRELEGY ELLIPTA) 100-62.5-25 MCG/INH AEPB Inhale 1 puff into the lungs daily.  Marland Kitchen tiotropium (SPIRIVA HANDIHALER) 18 MCG inhalation capsule Place 1 capsule (18 mcg total) into inhaler and inhale daily. (Patient not taking: Reported on 11/12/2018)   No facility-administered encounter medications on file as of 11/12/2018.     Functional Status:  In your present state of health, do you have any difficulty performing the following activities: 11/12/2018 11/03/2018  Hearing? N N  Vision? Y N  Comment pt complains of "blurred vision" -  Difficulty concentrating or making decisions? N N  Walking or climbing stairs? Y N  Comment related to COPD and breathing -  Dressing or bathing? N N  Doing errands, shopping? N N  Preparing Food and eating ? N -  Using the Toilet? N -  In the past six months, have you accidently leaked urine? N -  Do you have problems with loss of bowel control? N -  Managing your Medications? N -  Managing your Finances? N -  Housekeeping or managing your Housekeeping? Y -  Comment COPD, easily SOB with exertion -  Some recent data might be hidden    Fall/Depression Screening: Fall Risk  11/12/2018 01/28/2018  Falls in the past year? 0 0  Number falls in past yr: 0 -  Injury with Fall? 0 -  Risk for fall due to : Impaired vision;Medication side effect Impaired balance/gait;Medication side effect  Follow up Falls evaluation completed;Education provided Falls evaluation completed;Education provided   PHQ 2/9 Scores 11/12/2018 01/28/2018  PHQ - 2 Score 1 2  PHQ- 9 Score - 3    Assessment:   Consent: [facility] services reviewed and discussed with patient. Verbal consent for services given.   Plan: RN CM will follow with patient within two weeks.RN CM will send [facility] pharmacy referral for possible  med assistance and polypharmacy. RN CM will send welcome letter to patient. RN CM will route encounter and send barriers letter to MD.   [name], RN,BSN,CCM [place] Management Telephonic Care Management Coordinator Direct Phone: [phone] Toll Free: [phone] Fax: [phone]

## 2018-11-12 NOTE — Patient Outreach (Addendum)
[facility]) Care Management  [place]   11/12/2018  [name] Nov 24, 1950 [id number]  Reason for referral: Medication Assistance  Referral source: [facility] RN Current insurance: [facility]  PMHx includes but not limited to:  COPD on 3L 02 at home, hx tobacco abuse (40 pack year history) CAD s/p prior CABGx6 and subsequent PCI/DES in 2017/2020, hx aortic dissection, anxiety / depression, AAA without rupture, HTN, HLD, alcohol abuse in remission, chronic pain syndrome, hypothyroidism, noted recent hospitalization 10/12 -11/05/18 for AECOPD.    **Per review of notes, patient discharged home on both Spiriva and Trelegy - this is duplicate therapy with LAMA, does not need to take these together  **PCP is Dr. Sanjuana Mae, pharmacist in office with PCP with [facility], Helayne Seminole PharmD.    Outreach:  Unsuccessful telephone call attempt #1 to patient. Message left with spouse requesting a return call from patient   Message left with Leodis Binet and direct line for PharmD Helayne Seminole to transfer referral.    Plan:  -Will f/u with patient and/or Leodis Binet pharmacist tomorrow to ensure referral received  Ralene Bathe, PharmD, [place] [phone]     Addendum: Incoming call and voicemail from [facility] pharmacist.  She confirms patient had appt with Dr. Lysle Rubens today and she will f/u with him regarding inhaler therapy.  Patient is to only continue on Trelegy for now and she will assist with medication assistance assessment.   I will close [facility] pharmacy case.   Ralene Bathe, PharmD, [place] [phone]

## 2018-11-13 ENCOUNTER — Ambulatory Visit: Payer: Self-pay | Admitting: Pharmacist

## 2018-11-25 ENCOUNTER — Other Ambulatory Visit: Payer: Self-pay

## 2018-11-25 NOTE — Patient Outreach (Addendum)
[facility]) Care Management  11/25/2018  [name] 1950-06-13 [id number]   Telephone Assessment   Outreach attempt #1 to patient. Spoke with patient. He denies any acute issues or concerns at present. He voices that his "breathing and everything else wrong with him is about the same." He completed MD follow up appt with PCP. He has upcoming cardiology appt. Patient voices that pulmonology office not currently seeing patients due to COVID-19. He states that MD office has samples of inhaler med for him to pickup and he plans to have his wife drive him to pick it up on tomorrow. Patient is unsure of name of med. Patient reports that he is using his oxygen as needed and SOB is mainly controlled while at rest ut he does get shortwinded with nay exertion. RN CM reviewed with pt. activity pacing, freq rest periods and energy conservation measures. He voiced understanding. He denies any RN CM needs or concerns at this time.   THN CM Care Plan Problem One     Most Recent Value  Care Plan Problem One  Patient at high risk for readmission due to co-morbidities.  Role Documenting the Problem One  Care Management Telephonic [place] for Problem One  Active  [facility] Long Term Goal   Patient will have no hospital readmission over the next 31 days.  THN Long Term Goal Start Date  11/12/18  Interventions for Problem One Long Term Goal  RNCM assessed for acute issues or concerns. RN CM reviewed COPD action plan.  THN CM Short Term Goal #1   Patient will voice compleing  all post discharge MD follow up appts over the next 30 days.  THN CM Short Term Goal #1 Start Date  11/12/18  Interventions for Short Term Goal #1  RN CM assessed what appt pt. has completed and are still pending as well as reassessed for any barriers.   THN CM Short Term Goal #2   Patient will be able to verbalize at 2-3 symptom management measures for COPD over the enxt 30 days.  THN CM Short Term Goal #2 Start  Date  11/12/18  Interventions for Short Term Goal #2  RN CM reinforced COPD action pland and sx mgmt measures to help pt.       Plan: RN CM will discussed with patient next outreach within a month. Patient gave verbal consent and in agreement with RN CM follow up timeframe. Patient aware that they may contact RN CM sooner for any issues or concerns.  [name], RN,BSN,CCM [place] Management Telephonic Care Management Coordinator Direct Phone: [phone] Toll Free: [phone] Fax: [phone]

## 2018-11-26 ENCOUNTER — Ambulatory Visit: Payer: Self-pay

## 2018-12-01 ENCOUNTER — Other Ambulatory Visit: Payer: Self-pay | Admitting: Interventional Cardiology

## 2018-12-09 ENCOUNTER — Other Ambulatory Visit: Payer: Medicare HMO

## 2018-12-24 ENCOUNTER — Other Ambulatory Visit: Payer: Self-pay

## 2018-12-24 NOTE — Patient Outreach (Signed)
[facility]) Care Management  12/24/2018  KASSON MULHERN 19-Jun-1950 [id number]   Telephone Assessment   Outreach attempt #1 to patient. No answer after several rings and unable to leave message.      Plan: RN CM will make outreach attempt to patient within 3-4 business days.   [name], RN,BSN,CCM [place] Management Telephonic Care Management Coordinator Direct Phone: [phone] Toll Free: [phone] Fax: [phone]

## 2018-12-25 ENCOUNTER — Other Ambulatory Visit: Payer: Self-pay

## 2018-12-25 NOTE — Patient Outreach (Signed)
[facility]) Care Management  12/25/2018  [name] 06/13/1950 [id number]     Telephone Assessment   Outreach attempt #2 to patient. A male answered the phone ad voiced that patient was not available at present.      Plan: RN CM will make outreach attempt to patient within 3-4 business days. RN CM will send unsuccessful outreach letter to patient.    [name], RN,BSN,CCM [place] Management Telephonic Care Management Coordinator Direct Phone: [phone] Toll Free: [phone] Fax: [phone]

## 2018-12-29 ENCOUNTER — Other Ambulatory Visit: Payer: Self-pay

## 2018-12-29 NOTE — Patient Outreach (Signed)
[facility]) Care Management  12/29/2018  [name] 1950-05-23 [id number]   Telephone Assessment   Outreach attempt #2 to patient. No answer after multiple rings.       Plan: RN CM will make outreach attempt to patient within the month of January if no response from letter mailed to patient.   [name], RN,BSN,CCM [place] Management Telephonic Care Management Coordinator Direct Phone: [phone] Toll Free: [phone] Fax: [phone]

## 2019-01-06 ENCOUNTER — Telehealth: Payer: Self-pay | Admitting: Cardiology

## 2019-01-06 NOTE — Telephone Encounter (Signed)
New Message  Patient is calling in to see if he has or has ever had congestive heart failure. Please give patient a call back to advise.

## 2019-01-06 NOTE — Telephone Encounter (Signed)
The patient is calling to inquire if he ever had CHF.  I told him that I looked into his chart and did not see that indicated.  He verbalized understanding and thanked me for the call.

## 2019-01-12 ENCOUNTER — Telehealth: Payer: Self-pay | Admitting: Cardiology

## 2019-01-12 NOTE — Telephone Encounter (Signed)
New Message  Pt c/o medication issue:  1. Name of Medication: clopidogrel (PLAVIX) 75 MG tablet  2. How are you currently taking this medication (dosage and times per day)? As written  3. Are you having a reaction (difficulty breathing--STAT)? No  4. What is your medication issue? Pt has not taken medication in a month; needs to start taking medication due to having stints put in. Pt is out of refills and need a new prescription.

## 2019-01-12 NOTE — Telephone Encounter (Signed)
Lovena Le from [facility] called back and states that she spoke with the patient and he has been taking his plavix this whole time.

## 2019-01-12 NOTE — Telephone Encounter (Signed)
Returned call to clinical pharmacist assistant Lovena Le at [facility]. She states that the patient reported not taking his plavix x 73month due to it not being available at the pharmacy.   Called [facility] in [place] and they state that the patient picked up a 90 day supply of plavix on 12/4.  Attempted to contact patient to discuss further, but there was no answer.

## 2019-01-18 ENCOUNTER — Telehealth: Payer: Self-pay | Admitting: *Deleted

## 2019-01-18 DIAGNOSIS — J449 Chronic obstructive pulmonary disease, unspecified: Secondary | ICD-10-CM | POA: Diagnosis not present

## 2019-01-18 DIAGNOSIS — F329 Major depressive disorder, single episode, unspecified: Secondary | ICD-10-CM | POA: Diagnosis not present

## 2019-01-18 DIAGNOSIS — J441 Chronic obstructive pulmonary disease with (acute) exacerbation: Secondary | ICD-10-CM | POA: Diagnosis not present

## 2019-01-18 DIAGNOSIS — E039 Hypothyroidism, unspecified: Secondary | ICD-10-CM | POA: Diagnosis not present

## 2019-01-18 DIAGNOSIS — I25810 Atherosclerosis of coronary artery bypass graft(s) without angina pectoris: Secondary | ICD-10-CM | POA: Diagnosis not present

## 2019-01-18 DIAGNOSIS — I2510 Atherosclerotic heart disease of native coronary artery without angina pectoris: Secondary | ICD-10-CM | POA: Diagnosis not present

## 2019-01-18 DIAGNOSIS — I10 Essential (primary) hypertension: Secondary | ICD-10-CM | POA: Diagnosis not present

## 2019-01-18 DIAGNOSIS — D649 Anemia, unspecified: Secondary | ICD-10-CM | POA: Diagnosis not present

## 2019-01-18 DIAGNOSIS — I214 Non-ST elevation (NSTEMI) myocardial infarction: Secondary | ICD-10-CM | POA: Diagnosis not present

## 2019-01-18 NOTE — Telephone Encounter (Signed)
   [facility] Pre-operative Risk Assessment    Request for surgical clearance:  1. What type of surgery is being performed? CATARACT EXTRACTION BY PE, IOL   2. When is this surgery scheduled? 01/25/19   3. What type of clearance is required (medical clearance vs. Pharmacy clearance to hold med vs. Both)? MEDICAL  4. Are there any medications that need to be held prior to surgery and how long? PLAVIX    5. Practice name and name of physician performing surgery? [facility]; DR. Tama High   6. What is your office phone number [phone]   7.   What is your office fax number [phone]  8.   Anesthesia type (None, local, MAC, general) ? IV SEDATION   Julaine Hua 01/18/2019, 3:10 PM  _________________________________________________________________   (provider comments below)

## 2019-01-18 NOTE — Telephone Encounter (Signed)
   Primary Cardiologist: Candee Furbish, MD  Chart reviewed as part of pre-operative protocol coverage. Cataract extractions are recognized in guidelines as low risk surgeries that do not typically require specific preoperative testing or holding of blood thinner therapy. Therefore, given past medical history and time since last visit, based on ACC/AHA guidelines, TRAVIN SUPPA would be at acceptable risk for the planned procedure without further cardiovascular testing.   I will route this recommendation to the requesting party via Epic fax function and remove from pre-op pool.  Please call with questions.  Kerin Ransom, PA-C 01/18/2019, 3:28 PM

## 2019-01-27 ENCOUNTER — Telehealth: Payer: Self-pay | Admitting: Cardiology

## 2019-01-27 NOTE — Telephone Encounter (Signed)
New Message:    Please call, Pt said his life insurance was denied because of his diagnosis. He have some questions about the diagnosis that was listed.

## 2019-01-28 ENCOUNTER — Other Ambulatory Visit: Payer: Self-pay

## 2019-01-28 NOTE — Patient Outreach (Signed)
[facility]) Care Management  01/28/2019  [name] [birth year] [id number]   Telephone Assessment    Outreach attempt to patient. Spoke with patient who denies any acute issues or concerns at present. He voices that he is scheduled for eye surgery as he has been diagnosed with cataracts and glaucoma. HE shares that he has been very anxious about the procedure so much so that he has been having to take Ativan for it prn. He reports that he does not like any type of surgery especially "someone messing with his eyes." He voices that he had to get medical clearance from his cardiologist and other MDs which made him even more nervous. RN CM reviewed with patient what to expect pre and post procedure and provided support and reassurance. He confirms that spouse will be able to assist him as needed.     THN CM Care Plan Problem One     Most Recent Value  Care Plan Problem One  Patient at high risk for readmission due to co-morbidities.  Role Documenting the Problem One  Care Management Telephonic [employer] for Problem One  Active  [facility] Long Term Goal   Patient will have no hospital readmission over the next 31 days.  THN Long Term Goal Start Date  11/12/18  [facility]N Long Term Goal Met Date  01/28/19  [facility]N CM Short Term Goal #1   Patient will voice compleing  all post discharge MD follow up appts over the next 30 days.  THN CM Short Term Goal #1 Start Date  11/12/18  [facility] CM Short Term Goal #1 Met Date  01/28/19  [facility]N CM Short Term Goal #2   Patient will be able to verbalize at 2-3 symptom management measures for COPD over the enxt 30 days.  THN CM Short Term Goal #2 Start Date  11/12/18  [facility] CM Short Term Goal #2 Met Date  01/28/19    [facility] CM Care Plan Problem Two     Most Recent Value  Care Plan Problem Two  Patient scheduled for eye surgery.  Role Documenting the Problem Two  Care Management Telephonic [place] for Problem Two  Active  Interventions for  Problem Two Long Term Goal   RN CM provided support and reassurance to pt. RN CM reviewed with patietn what to epect pre and post procedure. RN CM confirmed support system in place to assist pt.   THN Long Term Goal  Patient will report successful eye surgery and recovery without complications over the next 31 days.  THN Long Term Goal Start Date  01/28/19  [facility] CM Short Term Goal #1   Patient will verbalize at least 2-3 anxiety coping mechanisms over the next 30 day  THN CM Short Term Goal #1 Start Date  01/28/19  Interventions for Short Term Goal #2   RNCM assessed for coping strategies. RN CM discussed and reviewed pharmcological and nonpharmcological coping measures with pt.      Plan: RN CM discussed with patient next outreach within a month. Patient gave verbal consent and in agreement with RN CM follow up and timeframe. Patient aware that they may contact RN CM sooner for any issues or concerns. RN CM will send quarterly update to PCP.   [name], RN,BSN,CCM [place] Management Telephonic Care Management Coordinator Direct Phone: [phone] Toll Free: [phone] Fax: [phone]

## 2019-01-29 ENCOUNTER — Ambulatory Visit: Payer: Self-pay

## 2019-01-29 NOTE — Telephone Encounter (Signed)
Spoke with patient in regards to insurance denial.  Pt reports he has not called the office about this in over 2 months. Pt denied any needs or questions at this time.

## 2019-02-01 DIAGNOSIS — Z01818 Encounter for other preprocedural examination: Secondary | ICD-10-CM | POA: Diagnosis not present

## 2019-02-01 DIAGNOSIS — H2512 Age-related nuclear cataract, left eye: Secondary | ICD-10-CM | POA: Diagnosis not present

## 2019-02-01 DIAGNOSIS — H2511 Age-related nuclear cataract, right eye: Secondary | ICD-10-CM | POA: Diagnosis not present

## 2019-02-01 DIAGNOSIS — H2513 Age-related nuclear cataract, bilateral: Secondary | ICD-10-CM | POA: Diagnosis not present

## 2019-02-01 DIAGNOSIS — H353131 Nonexudative age-related macular degeneration, bilateral, early dry stage: Secondary | ICD-10-CM | POA: Diagnosis not present

## 2019-02-02 DIAGNOSIS — I10 Essential (primary) hypertension: Secondary | ICD-10-CM | POA: Diagnosis not present

## 2019-02-02 DIAGNOSIS — E039 Hypothyroidism, unspecified: Secondary | ICD-10-CM | POA: Diagnosis not present

## 2019-02-02 DIAGNOSIS — J449 Chronic obstructive pulmonary disease, unspecified: Secondary | ICD-10-CM | POA: Diagnosis not present

## 2019-02-02 DIAGNOSIS — I7101 Dissection of thoracic aorta: Secondary | ICD-10-CM | POA: Diagnosis not present

## 2019-02-02 DIAGNOSIS — I25810 Atherosclerosis of coronary artery bypass graft(s) without angina pectoris: Secondary | ICD-10-CM | POA: Diagnosis not present

## 2019-02-02 DIAGNOSIS — I700 Atherosclerosis of aorta: Secondary | ICD-10-CM | POA: Diagnosis not present

## 2019-02-02 DIAGNOSIS — I714 Abdominal aortic aneurysm, without rupture: Secondary | ICD-10-CM | POA: Diagnosis not present

## 2019-02-02 DIAGNOSIS — I214 Non-ST elevation (NSTEMI) myocardial infarction: Secondary | ICD-10-CM | POA: Diagnosis not present

## 2019-02-02 DIAGNOSIS — E7800 Pure hypercholesterolemia, unspecified: Secondary | ICD-10-CM | POA: Diagnosis not present

## 2019-02-04 ENCOUNTER — Other Ambulatory Visit: Payer: Self-pay | Admitting: *Deleted

## 2019-02-04 DIAGNOSIS — I7101 Dissection of thoracic aorta: Secondary | ICD-10-CM

## 2019-02-04 DIAGNOSIS — I71019 Dissection of thoracic aorta, unspecified: Secondary | ICD-10-CM

## 2019-02-04 NOTE — Progress Notes (Unsigned)
Ct chest

## 2019-02-13 ENCOUNTER — Other Ambulatory Visit: Payer: Self-pay | Admitting: Internal Medicine

## 2019-02-13 DIAGNOSIS — I714 Abdominal aortic aneurysm, without rupture, unspecified: Secondary | ICD-10-CM

## 2019-02-17 DIAGNOSIS — D649 Anemia, unspecified: Secondary | ICD-10-CM | POA: Diagnosis not present

## 2019-02-17 DIAGNOSIS — I10 Essential (primary) hypertension: Secondary | ICD-10-CM | POA: Diagnosis not present

## 2019-02-17 DIAGNOSIS — F329 Major depressive disorder, single episode, unspecified: Secondary | ICD-10-CM | POA: Diagnosis not present

## 2019-02-17 DIAGNOSIS — E039 Hypothyroidism, unspecified: Secondary | ICD-10-CM | POA: Diagnosis not present

## 2019-02-17 DIAGNOSIS — E7800 Pure hypercholesterolemia, unspecified: Secondary | ICD-10-CM | POA: Diagnosis not present

## 2019-02-17 DIAGNOSIS — I25810 Atherosclerosis of coronary artery bypass graft(s) without angina pectoris: Secondary | ICD-10-CM | POA: Diagnosis not present

## 2019-02-17 DIAGNOSIS — I214 Non-ST elevation (NSTEMI) myocardial infarction: Secondary | ICD-10-CM | POA: Diagnosis not present

## 2019-02-17 DIAGNOSIS — I2510 Atherosclerotic heart disease of native coronary artery without angina pectoris: Secondary | ICD-10-CM | POA: Diagnosis not present

## 2019-02-23 ENCOUNTER — Other Ambulatory Visit: Payer: Self-pay

## 2019-02-23 NOTE — Patient Outreach (Signed)
[facility]) Care Management  02/23/2019  IMAD KRIS 06-Jan-1951 [id number]   Telephone Assessment   Outreach attempt #1 to patient. No answer after multiple rings and unable to leave message.      Plan: RN CM will make outreach attempt to patient within the month of March if no return call from patient.   [name], RN,BSN,CCM [place] Management Telephonic Care Management Coordinator Direct Phone: [phone] Toll Free: [phone] Fax: [phone]

## 2019-02-26 ENCOUNTER — Ambulatory Visit: Payer: Self-pay

## 2019-03-04 ENCOUNTER — Ambulatory Visit
Admission: RE | Admit: 2019-03-04 | Discharge: 2019-03-04 | Disposition: A | Discharge status: Home / Self Care | Payer: Medicare HMO | Source: Ambulatory Visit | Attending: Thoracic Surgery (Cardiothoracic Vascular Surgery) | Admitting: Thoracic Surgery (Cardiothoracic Vascular Surgery)

## 2019-03-04 DIAGNOSIS — N200 Calculus of kidney: Secondary | ICD-10-CM | POA: Diagnosis not present

## 2019-03-04 DIAGNOSIS — I71019 Dissection of thoracic aorta, unspecified: Secondary | ICD-10-CM

## 2019-03-04 DIAGNOSIS — I7101 Dissection of thoracic aorta: Secondary | ICD-10-CM

## 2019-03-04 DIAGNOSIS — I712 Thoracic aortic aneurysm, without rupture: Secondary | ICD-10-CM | POA: Diagnosis not present

## 2019-03-09 ENCOUNTER — Other Ambulatory Visit: Payer: Self-pay

## 2019-03-09 ENCOUNTER — Other Ambulatory Visit: Payer: Medicare HMO

## 2019-03-09 ENCOUNTER — Ambulatory Visit: Payer: Medicare HMO | Admitting: Thoracic Surgery (Cardiothoracic Vascular Surgery)

## 2019-03-09 VITALS — BP 166/102 | HR 74 | Temp 98.1°F | Resp 20 | Ht 70.5 in | Wt 168.0 lb

## 2019-03-09 DIAGNOSIS — I7123 Aneurysm of the descending thoracic aorta, without rupture: Secondary | ICD-10-CM

## 2019-03-09 DIAGNOSIS — I25810 Atherosclerosis of coronary artery bypass graft(s) without angina pectoris: Secondary | ICD-10-CM

## 2019-03-09 DIAGNOSIS — I700 Atherosclerosis of aorta: Secondary | ICD-10-CM | POA: Diagnosis not present

## 2019-03-09 DIAGNOSIS — I712 Thoracic aortic aneurysm, without rupture: Secondary | ICD-10-CM | POA: Diagnosis not present

## 2019-03-09 NOTE — Progress Notes (Signed)
[address]       [place] [address]             [phone]      HPI: Mr. Mazzotti returns for a scheduled follow-up visit  Colbi Mulry is a 69 year old man with a history of coronary disease, coronary bypass grafting in 2009, drug-eluting stent placement in 2020, type III aortic dissection in 2008, descending thoracic aneurysm, infrarenal abdominal aortic aneurysm, remote tobacco abuse, COPD, chronic back pain, anxiety, and depression.  I last saw him in the office about 6 months ago for follow-up of his aneurysm.  His aneurysms were stable, but he was having frequent angina.  He then followed up with [name] and had a catheterization.  He had stenting of one of his vein grafts as well as his native right coronary.  He felt better for about a month after his stenting but since then has had severe fatigue even walking from his couch to the kitchen.  He is not having chest pain per se but it is relieved with rest.  Past Medical History:  Diagnosis Date  . Anemia   . Anxiety   . Aortic dissection (HCC)    TYPE 3  . CAD (coronary artery disease)    multiple, most recently Jan-March 2017  . Cardiac tamponade   . Chronic bronchitis ([place])   . Chronic fatigue   . Chronic pain   . Constipation   . COPD (chronic obstructive pulmonary disease) ([place])   . Depression   . Dysphagia, oropharyngeal phase   . ETOH abuse   . GERD (gastroesophageal reflux disease)   . Hiatal hernia   . HTN (hypertension)   . Hypercholesterolemia   . Metabolic encephalopathy   . Opioid abuse ([place])   . Peptic ulcer disease 08/2010   EGD   . PNA (pneumonia)   . Schatzki's ring     Current Outpatient Medications  Medication Sig Dispense Refill  . albuterol (PROVENTIL) (2.5 MG/3ML) 0.083% nebulizer solution Take 3 mLs (2.5 mg total) by nebulization every 6 (six) hours as needed for wheezing or shortness of breath.    Marland Kitchen albuterol (VENTOLIN HFA) 108 (90 Base) MCG/ACT inhaler Inhale 2 puffs  into the lungs every 6 (six) hours as needed for wheezing or shortness of breath. (Patient not taking: Reported on 11/12/2018) 18 g 2  . aspirin EC 81 MG tablet Take 1 tablet (81 mg total) by mouth daily. 30 tablet 12  . atorvastatin (LIPITOR) 40 MG tablet Take 1 tablet (40 mg total) by mouth daily. 90 tablet 3  . busPIRone (BUSPAR) 7.5 MG tablet Take 1 tablet (7.5 mg total) by mouth 3 (three) times daily. 45 tablet 0  . clopidogrel (PLAVIX) 75 MG tablet Take 1 tablet by mouth once daily 30 tablet 11  . feeding supplement, ENSURE ENLIVE, (ENSURE ENLIVE) LIQD Take 237 mLs by mouth 2 (two) times daily between meals.    . Fluticasone-Umeclidin-Vilant (TRELEGY ELLIPTA) 100-62.5-25 MCG/INH AEPB Inhale 1 puff into the lungs daily.    Marland Kitchen gabapentin (NEURONTIN) 300 MG capsule Take 300 mg by mouth 3 (three) times daily.    Marland Kitchen levothyroxine (SYNTHROID, LEVOTHROID) 50 MCG tablet Take 50 mcg by mouth daily.    Marland Kitchen LORazepam (ATIVAN) 0.5 MG tablet Take 0.5 mg by mouth 2 (two) times daily. As needed    . metoprolol tartrate (LOPRESSOR) 25 MG tablet Take 1 tablet (25 mg total) by mouth 3 (three) times daily. 270 tablet  3  . nitroGLYCERIN (NITROSTAT) 0.4 MG SL tablet Place 1 tablet (0.4 mg total) under the tongue every 5 (five) minutes as needed for chest pain. 25 tablet 3  . OXYGEN Inhale 3.5 L into the lungs daily as needed (for shortness of breath). 3-3.5lpm 24/7     . pantoprazole (PROTONIX) 40 MG tablet Take 1 tablet (40 mg total) by mouth at bedtime. 30 tablet 0  . tiotropium (SPIRIVA HANDIHALER) 18 MCG inhalation capsule Place 1 capsule (18 mcg total) into inhaler and inhale daily. (Patient not taking: Reported on 11/12/2018) 30 capsule 2  . traMADol (ULTRAM) 50 MG tablet Take 2 tablets (100 mg total) by mouth every 8 (eight) hours as needed for severe pain. 20 tablet 0  . traZODone (DESYREL) 100 MG tablet Take 1 tablet (100 mg total) by mouth at bedtime as needed for sleep. (Patient taking differently: Take 100  mg by mouth at bedtime. ) 30 tablet 0   No current facility-administered medications for this visit.    Physical Exam BP (!) 166/102 (BP Location: Right Arm)   Pulse 74   Temp 98.1 F (36.7 C) (Temporal)   Resp 20 Comment: RA  Ht 5' 10.5" (1.791 m)   Wt 168 lb (76.2 kg)   SpO2 97%   BMI 23.68 kg/m  69 year old man in no acute distress Flat affect Alert and oriented x3 with no focal neurologic deficits Cardiac regular rate and rhythm with normal S1 and S2 Lungs clear with equal breath sounds bilaterally No peripheral edema  Diagnostic Tests: CT CHEST, ABDOMEN AND PELVIS WITHOUT CONTRAST  TECHNIQUE: Multidetector CT imaging of the chest, abdomen and pelvis was performed following the standard protocol without IV contrast.  COMPARISON:  11/03/2018  FINDINGS: CT CHEST FINDINGS  Cardiovascular: Signs of dilation of the ascending thoracic aorta and dissection are grossly similar to the previous examination. Maximal caliber of the distal arch and descending thoracic aorta approximately 4.4 cm (image 55, series 4) heterogeneous material within the lumen compatible with known dissection with crescentic variable attenuation along the superior and lateral margin of the arch and descending thoracic aorta respectively.  Ascending thoracic aorta 3.5 cm unchanged.  Signs of coronary artery disease and CABG. No pericardial effusion. Heart size is stable.  Caliber of the descending thoracic aorta just above the aortic hiatus approximately 4.2 x 3.8 cm unchanged.  Mediastinum/Nodes: No enlarged mediastinal, hilar, or axillary lymph nodes. Thyroid gland, trachea, and esophagus demonstrate no significant findings.  Lungs/Pleura: Signs of pulmonary emphysema. No signs of consolidation or evidence of pleural effusion. Elevation of left hemidiaphragm. Mild scarring in the lingula. Minimal atelectasis at the left base. Airways are patent.  Musculoskeletal: No signs of  chest wall lesion.  CT ABDOMEN PELVIS FINDINGS  Hepatobiliary: No signs of focal suspicious hepatic lesion. The gallbladder is normal. No signs of biliary ductal dilation.  Pancreas: Pancreas normal size no focal lesion on noncontrast imaging. No ductal dilation or inflammation.  Spleen: Spleen is normal.  Adrenals/Urinary Tract: Adrenal glands are normal.  Nephrolithiasis lower pole right kidney measuring 2-3 mm.  No signs of hydronephrosis despite distal right ureteral calculus measuring approximately 4 x 5 mm. Similar position as compared to the previous imaging evaluation. No left ureteral calculi.  Stomach/Bowel: No signs of acute gastrointestinal process. The appendix is normal. Stool fills much of the colon. Moderate colonic diverticulosis of the sigmoid colon  Vascular/Lymphatic:  At the level of the aortic hiatus (image 162, series 4) 2.7 x 2.7 cm abdominal aorta,  this is just below the hiatus and at the origin of the renal arteries.  Fusiform aneurysmal dilation of the infrarenal segment showing maximal caliber of 4.5 x 4.5 cm not significantly changed compared to the prior exam.  Left common iliac artery measures 2 cm, stable dilation compared to the previous exam.  Right lateral contour bulging along the midportion of the fusiform aneurysm is also similar to the prior study. At this level the aorta in transverse dimension measuring approximately 5.1 cm is unchanged.  No signs of retroperitoneal lymphadenopathy.  No signs of pelvic lymphadenopathy.  Reproductive: Prostate normal by CT.  Other: No abdominal wall hernia or abnormality. No abdominopelvic ascites.  Musculoskeletal: L4 through S1 spinal fusion and interbody cage placement, associated with laminectomy as before. No signs of acute bone process or destructive bone lesion.  IMPRESSION: 1. Limited assessment of aortic dissection but with grossly stable appearance of the  thoracoabdominal aortic aneurysm. 2. Maximal aortic caliber in transverse dimension at the level of the eccentric dilation in the mid infrarenal abdominal aorta approximately 5.1 cm, not changed. Recommend followup in 3-6 months, and vascular surgery referral/consultation if not already obtained. Follow-up of the chest could also be performed at this time as clinically warranted. This recommendation follows ACR consensus guidelines: White Paper of the ACR Incidental Findings Committee II on Vascular Findings. J Am Coll Radiol 2013; 10:789-794. Aortic aneurysm NOS (ICD10-I71.9) 3. Stable 5 mm distal right ureteral calculus. No signs of hydronephrosis despite the presence of right nephrolithiasis.  Aortic Atherosclerosis (ICD10-I70.0) and Emphysema (ICD10-J43.9).   Electronically Signed   By: Zetta Bills M.D.   On: 03/04/2019 12:11 I personally reviewed his CT images and concur with the findings noted above  Impression: Osceola Koe is a 69 year old man with a past medical history significant for coronary artery disease, coronary bypass grafting, PCI, aortic atherosclerosis, type III aortic dissection, descending thoracic aortic aneurysm, abdominal aortic aneurysm, COPD, hypertension, hyperlipidemia, anxiety, depression, and chronic pain.  Aortic atherosclerosis/descending thoracic aortic aneurysm in setting of previous type III dissection-stable, needs follow-up scan in 6 months.  Infrarenal abdominal aneurysm-follow-up with Dr. Donnetta Hutching as scheduled  Severe fatigue-could be a manifestation of coronary disease.  Needs to follow-up with [name].  He does seem depressed and has had a history of depression and that could be playing a role as well.  Will defer to [name] regarding need for ischemic testing or catheterization.  Hypertension-blood pressure elevated today.  He expresses compliance with medications.  Plan: Return in 6 months with CT of chest, abdomen and  pelvis Follow-up as scheduled with Dr. Donnetta Hutching Follow-up with Dr. Serena Colonel, MD [facility] [phone]

## 2019-03-19 DIAGNOSIS — I2510 Atherosclerotic heart disease of native coronary artery without angina pectoris: Secondary | ICD-10-CM | POA: Diagnosis not present

## 2019-03-19 DIAGNOSIS — I10 Essential (primary) hypertension: Secondary | ICD-10-CM | POA: Diagnosis not present

## 2019-03-19 DIAGNOSIS — I214 Non-ST elevation (NSTEMI) myocardial infarction: Secondary | ICD-10-CM | POA: Diagnosis not present

## 2019-03-19 DIAGNOSIS — J449 Chronic obstructive pulmonary disease, unspecified: Secondary | ICD-10-CM | POA: Diagnosis not present

## 2019-03-19 DIAGNOSIS — J441 Chronic obstructive pulmonary disease with (acute) exacerbation: Secondary | ICD-10-CM | POA: Diagnosis not present

## 2019-03-19 DIAGNOSIS — I25810 Atherosclerosis of coronary artery bypass graft(s) without angina pectoris: Secondary | ICD-10-CM | POA: Diagnosis not present

## 2019-03-19 DIAGNOSIS — E039 Hypothyroidism, unspecified: Secondary | ICD-10-CM | POA: Diagnosis not present

## 2019-03-19 DIAGNOSIS — D649 Anemia, unspecified: Secondary | ICD-10-CM | POA: Diagnosis not present

## 2019-03-19 DIAGNOSIS — F329 Major depressive disorder, single episode, unspecified: Secondary | ICD-10-CM | POA: Diagnosis not present

## 2019-03-22 ENCOUNTER — Other Ambulatory Visit: Payer: Self-pay

## 2019-03-22 MED ORDER — CLOPIDOGREL BISULFATE 75 MG PO TABS: 75.0000 mg | ORAL_TABLET | Freq: Every day | ORAL | 2 refills | Status: DC

## 2019-03-22 NOTE — Patient Outreach (Signed)
[facility]) Care Management  03/22/2019  BERTEL VENARD [birth year] [id number]   Telephone Assessment    Outreach attempt to patient. Spoke with patient who reports he is doing fairly well. He had successful eye surgery to left eye last month. He reports he has finished taking eye gtts. He voices that his vision is still a little fuzzy but attributes it to the right eye still needing surgery. He reports he was unable to have both eyes done due to financial cost. He plans to save money and go get right eye surgery done within the next couple of months. Patient reports that he continues to have periods of SOB with exertion and sometimes at rest. RN CM reviewed sx mgmt measures with patient. He does voice relief when he takes meds to help. He states that MD thinks "something may be going on with his heart." he goes tomorrow to have MRI done to further evaluate situation. RN CM reviewed with pt s/s of worsening condition dn when to seek medical attention. He voiced understanding. He denies any RN CM needs or concerns at this time.   Medications Reviewed Today    Reviewed by Marcia Brash, RN (Registered Nurse) on 03/09/19 at 1223  Med List Status: <None>  Medication Order Taking? Sig Documenting Provider Last Dose Status Informant  albuterol (PROVENTIL) (2.5 MG/3ML) 0.083% nebulizer solution [id number]  Take 3 mLs (2.5 mg total) by nebulization every 6 (six) hours as needed for wheezing or shortness of breath. Barton Dubois, MD  Active Self  albuterol (VENTOLIN HFA) 108 (90 Base) MCG/ACT inhaler [id number]  Inhale 2 puffs into the lungs every 6 (six) hours as needed for wheezing or shortness of breath.  Patient not taking: Reported on 11/12/2018   Barton Dubois, MD  Active   aspirin EC 81 MG tablet [id number]  Take 1 tablet (81 mg total) by mouth daily. Jettie Booze, MD  Active Self  atorvastatin (LIPITOR) 40 MG tablet [id number]  Take 1 tablet (40 mg total) by mouth daily.  Imogene Burn, PA-C  Active Self           Med Note Luana Shu, NATASHA   Mon Oct 12, 2018 10:00 AM)    busPIRone (BUSPAR) 7.5 MG tablet [id number]  Take 1 tablet (7.5 mg total) by mouth 3 (three) times daily. Barton Dubois, MD  Active Self           Med Note (MENDENHALL, [name] Jan 22, 2018  2:38 PM)    clopidogrel (PLAVIX) 75 MG tablet [id number]  Take 1 tablet by mouth once daily Jettie Booze, MD  Active   feeding supplement, ENSURE Jeanne Ivan (ENSURE ENLIVE) LIQD [id number]  Take 237 mLs by mouth 2 (two) times daily between meals. Barton Dubois, MD  Active Self  Fluticasone-Umeclidin-Vilant (TRELEGY ELLIPTA) 100-62.5-25 MCG/INH AEPB [id number]  Inhale 1 puff into the lungs daily. [provider]  Active Self  gabapentin (NEURONTIN) 300 MG capsule [id number]  Take 300 mg by mouth 3 (three) times daily. [provider]  Active Self  levothyroxine (SYNTHROID, LEVOTHROID) 50 MCG tablet [id number]  Take 50 mcg by mouth daily. [provider]  Active Self  LORazepam (ATIVAN) 0.5 MG tablet [id number]  Take 0.5 mg by mouth 2 (two) times daily. As needed [provider]  Active Self  metoprolol tartrate (LOPRESSOR) 25 MG tablet [id number]  Take 1 tablet (25 mg total) by mouth 3 (three) times daily. Isaiah Serge, NP  Active  Self  nitroGLYCERIN (NITROSTAT) 0.4 MG SL tablet [id number]  Place 1 tablet (0.4 mg total) under the tongue every 5 (five) minutes as needed for chest pain. Murrell Converse  Active Self  OXYGEN [id number]  Inhale 3.5 L into the lungs daily as needed (for shortness of breath). 3-3.5lpm 24/7  [provider]  Active Self  pantoprazole (PROTONIX) 40 MG tablet [id number]  Take 1 tablet (40 mg total) by mouth at bedtime. [name], MD  Active Self  tiotropium (SPIRIVA HANDIHALER) 18 MCG inhalation capsule [id number]  Place 1 capsule (18 mcg total) into inhaler and inhale daily.  Patient not taking: Reported on 11/12/2018    Barton Dubois, MD  Active   traMADol Veatrice Bourbon) 50 MG tablet [id number]  Take 2 tablets (100 mg total) by mouth every 8 (eight) hours as needed for severe pain. Barton Dubois, MD  Active   traZODone (DESYREL) 100 MG tablet [id number]  Take 1 tablet (100 mg total) by mouth at bedtime as needed for sleep.  Patient taking differently: Take 100 mg by mouth at bedtime.    Barton Dubois, MD  Active Self           Med Note (MENDENHALL, [name] Jan 22, 2018  2:41 PM)    Med List Note Kingsley Callander 09/18/18 1538): Patient can not lay on his back          [facility] CM Care Plan Problem One     Most Recent Value  Care Plan Problem One  Knowledge deficit related to disease process and mgmt of chronic conditions.   Role Documenting the Problem One  Care Management Telephonic [place] for Problem One  Active  [facility] Long Term Goal   Patient will have no exacerbation of sxs and  hospital admission over the next 60 days.  THN Long Term Goal Start Date  03/22/19  THN Long Term Goal Met Date  01/28/19  Interventions for Problem One Long Term Goal  RN CM discussed and educated pt on sx mgmt, action plan measures and when to seek medical attention  THN CM Short Term Goal #1   Patient will be able to verbalize at least 2-3 ways to manage sxs/ SOB in the home over the next 30 days  THN CM Short Term Goal #1 Start Date  03/22/19  Interventions for Short Term Goal #1  RN CM educated pt on disease process, sx mgmt, action plan steps and when to seek medical attention    [facility] CM Care Plan Problem Two     Most Recent Value  Care Plan Problem Two  Patient scheduled for eye surgery.  Role Documenting the Problem Two  Care Management [place] for Problem Two  Active  Interventions for Problem Two Long Term Goal   RN CM discussed risks and benefits of surgery  THN Long Term Goal  Patient will report successful right eye surgery and recovery without complications over  the next 90 days.  THN Long Term Goal Start Date  03/22/19  THN Long Term Goal Met Date  03/22/19  [facility] CM Short Term Goal #1   Patient will verbalize at least 2-3 anxiety coping mechanisms over the next 30 day  THN CM Short Term Goal #1 Start Date  01/28/19  [facility] CM Short Term Goal #1 Met Date   03/22/19      Plan: RN CM discussed with patient next outreach within the month  of April. Patient gave verbal consent and in agreement with RN CM follow up and timeframe. Patient aware that they may contact RN CM sooner for any issues or concerns.  RN CM will send quarterly update to PCP.   [name], RN,BSN,CCM [place] Management Telephonic Care Management Coordinator Direct Phone: [phone] Toll Free: [phone] Fax: [phone]

## 2019-03-23 ENCOUNTER — Other Ambulatory Visit: Payer: Self-pay

## 2019-03-23 ENCOUNTER — Ambulatory Visit
Admission: RE | Admit: 2019-03-23 | Discharge: 2019-03-23 | Disposition: A | Discharge status: Home / Self Care | Payer: Medicare HMO | Source: Ambulatory Visit | Attending: Internal Medicine | Admitting: Internal Medicine

## 2019-03-23 DIAGNOSIS — I714 Abdominal aortic aneurysm, without rupture, unspecified: Secondary | ICD-10-CM

## 2019-03-23 DIAGNOSIS — R0609 Other forms of dyspnea: Secondary | ICD-10-CM | POA: Diagnosis not present

## 2019-03-23 DIAGNOSIS — I712 Thoracic aortic aneurysm, without rupture: Secondary | ICD-10-CM | POA: Diagnosis not present

## 2019-03-23 MED ORDER — IOPAMIDOL (ISOVUE-370) INJECTION 76%
75.0000 mL | Freq: Once | INTRAVENOUS | Status: AC | PRN
  Administered 2019-03-23: 75 mL via INTRAVENOUS

## 2019-04-08 DIAGNOSIS — J441 Chronic obstructive pulmonary disease with (acute) exacerbation: Secondary | ICD-10-CM | POA: Diagnosis not present

## 2019-04-08 DIAGNOSIS — F329 Major depressive disorder, single episode, unspecified: Secondary | ICD-10-CM | POA: Diagnosis not present

## 2019-04-08 DIAGNOSIS — I214 Non-ST elevation (NSTEMI) myocardial infarction: Secondary | ICD-10-CM | POA: Diagnosis not present

## 2019-04-08 DIAGNOSIS — I10 Essential (primary) hypertension: Secondary | ICD-10-CM | POA: Diagnosis not present

## 2019-04-08 DIAGNOSIS — E039 Hypothyroidism, unspecified: Secondary | ICD-10-CM | POA: Diagnosis not present

## 2019-04-08 DIAGNOSIS — D649 Anemia, unspecified: Secondary | ICD-10-CM | POA: Diagnosis not present

## 2019-04-08 DIAGNOSIS — J449 Chronic obstructive pulmonary disease, unspecified: Secondary | ICD-10-CM | POA: Diagnosis not present

## 2019-04-08 DIAGNOSIS — I2510 Atherosclerotic heart disease of native coronary artery without angina pectoris: Secondary | ICD-10-CM | POA: Diagnosis not present

## 2019-04-08 DIAGNOSIS — I25810 Atherosclerosis of coronary artery bypass graft(s) without angina pectoris: Secondary | ICD-10-CM | POA: Diagnosis not present

## 2019-04-26 ENCOUNTER — Other Ambulatory Visit: Payer: Self-pay

## 2019-04-26 NOTE — Patient Outreach (Signed)
[place] [facility]) Care Management  04/26/2019  FINNLY BRIETZKE [birth year] [id number]   Telephone Assessment     Outreach attempt #1 to patient. No answer after several rings.       Plan: RN CM will make outreach attempt to patient within the month of May if no return call from patient.    [name], RN,BSN,CCM [place] Management Telephonic Care Management Coordinator Direct Phone: [phone] Toll Free: [phone] Fax: [phone]

## 2019-05-04 ENCOUNTER — Telehealth: Payer: Self-pay | Admitting: Cardiology

## 2019-05-04 NOTE — Telephone Encounter (Addendum)
Lovena Le with [facility] called to alert [name] that the pt has reported to them that he has been having continued chest pain and SOB with exertion.   I called the pt and he says that he has been having these symptoms for several months with strong exertion.. he likes to workout and he has to limit his level of work. He says this has been going on without relief since his 08/2018 heart cath.   Pt denies chest pain at rest.. he says rest helps the pain and he does not have to use the nitro.. he does not like to use it because it gives him a headache. His BP has been staying around 160/70's and HR 70-80. He is taking his Plavix and ASA daily as with all of his other meds.   Pt says when he has the exertional pain it is pressure in nature in the center of his chest and he feels he cannot take a deep breath with it.   He is due in May for follow up with [name].. I made him an appt for this Friday 05/07/19.. I asked him to please call us back or call EMS if his chest pain re-occurs and he should also keep his nitro nearby if rest is not enough for relief. Pt to call EMS or go to the ED it becomes persistent.   Will forward to [name] for review. Pt had chest and abdominal CT in Feb and March 2021.

## 2019-05-04 NOTE — Telephone Encounter (Signed)
[name] is calling stating [name] called their office this morning stating he is having CP and SOB upon any exertion. She states he states both the CP and SOB go away upon resting. [name] informed the office he had tried to call our office to inform [name] of this, but the call volume was always to high so he would hang up. [name] is overdue for an appointment with [name] and he was called Friday 04/30/19 to try and schedule this, but there was no answer and a call back has not been received. I did not do a dot phrase for CP or SOB due to [place] not having any more information in regards to it. Please advise.

## 2019-05-07 ENCOUNTER — Encounter: Payer: Self-pay | Admitting: Cardiology

## 2019-05-07 ENCOUNTER — Ambulatory Visit: Payer: Medicare HMO | Admitting: Cardiology

## 2019-05-07 ENCOUNTER — Other Ambulatory Visit: Payer: Self-pay

## 2019-05-07 VITALS — BP 180/110 | HR 76 | Ht 70.0 in | Wt 167.4 lb

## 2019-05-07 DIAGNOSIS — I2510 Atherosclerotic heart disease of native coronary artery without angina pectoris: Secondary | ICD-10-CM | POA: Diagnosis not present

## 2019-05-07 DIAGNOSIS — I714 Abdominal aortic aneurysm, without rupture, unspecified: Secondary | ICD-10-CM

## 2019-05-07 DIAGNOSIS — Z01812 Encounter for preprocedural laboratory examination: Secondary | ICD-10-CM

## 2019-05-07 DIAGNOSIS — R079 Chest pain, unspecified: Secondary | ICD-10-CM

## 2019-05-07 DIAGNOSIS — Z95820 Peripheral vascular angioplasty status with implants and grafts: Secondary | ICD-10-CM | POA: Diagnosis not present

## 2019-05-07 DIAGNOSIS — I10 Essential (primary) hypertension: Secondary | ICD-10-CM

## 2019-05-07 LAB — CBC
Hematocrit: 40.7 % (ref 37.5–51.0)
Hemoglobin: 14 g/dL (ref 13.0–17.7)
MCH: 33.2 pg — ABNORMAL HIGH (ref 26.6–33.0)
MCHC: 34.4 g/dL (ref 31.5–35.7)
MCV: 96 fL (ref 79–97)
Platelets: 156 10*3/uL (ref 150–450)
RBC: 4.22 x10E6/uL (ref 4.14–5.80)
RDW: 12.5 % (ref 11.6–15.4)
WBC: 8.1 10*3/uL (ref 3.4–10.8)

## 2019-05-07 LAB — BASIC METABOLIC PANEL
BUN/Creatinine Ratio: 10 (ref 10–24)
BUN: 10 mg/dL (ref 8–27)
CO2: 27 mmol/L (ref 20–29)
Calcium: 9 mg/dL (ref 8.6–10.2)
Chloride: 102 mmol/L (ref 96–106)
Creatinine, Ser: 0.98 mg/dL (ref 0.76–1.27)
GFR calc Af Amer: 92 mL/min/{1.73_m2} (ref >59)
GFR calc non Af Amer: 79 mL/min/{1.73_m2} (ref >59)
Glucose: 79 mg/dL (ref 65–99)
Potassium: 3.9 mmol/L (ref 3.5–5.2)
Sodium: 142 mmol/L (ref 134–144)

## 2019-05-07 NOTE — Patient Instructions (Addendum)
Medication Instructions:  Your physician recommends that you continue on your current medications as directed. Please refer to the Current Medication list given to you today. Current  *If you need a refill on your cardiac medications before your next appointment, please call your pharmacy*   Lab Work: CBC and BMET today  If you have labs (blood work) drawn today and your tests are completely normal, you will receive your results only by: Marland Kitchen MyChart Message (if you have MyChart) OR . A paper copy in the mail If you have any lab test that is abnormal or we need to change your treatment, we will call you to review the results.   Testing/Procedures:    [place] OFFICE [place], [place] [address] Dept: [phone] Loc: [place]  05/07/2019  You are scheduled for a Cardiac Catheterization on Friday, April 23 with Dr. Larae Grooms.  1. Please arrive at the [facility] (Main Entrance A) at [facility]: [address] [place], [place] [address] at 7:00 AM (This time is two hours before your procedure to ensure your preparation). Free valet parking service is available.   Special note: Every effort is made to have your procedure done on time. Please understand that emergencies sometimes delay scheduled procedures.  2. Diet: Do not eat solid foods after midnight.  The patient may have clear liquids until 5am upon the day of the procedure.  3. Labs: You will need to have blood drawn   Today CBC and BMET You do not need to be fasting.  Your Pre-procedure COVID-19 Testing will be done on 05/11/2019 at 240pm at [facility].  Please be on time for your appointment.  After your swab you will be given a mask to wear and instructed to go home and quarantine/no visitors until after your procedure. If you test positive you will be notified and your procedure will  be cancelled.    On the morning of your procedure, take your Aspirin and Plavix and any morning medicines NOT listed above.  You may use sips of water.  5. Plan for one night stay--bring personal belongings. 6. Bring a current list of your medications and current insurance cards. 7. You MUST have a responsible person to drive you home. 8. Someone MUST be with you the first 24 hours after you arrive home or your discharge will be delayed. 9. Please wear clothes that are easy to get on and off and wear slip-on shoes.  Thank you for allowing [place] to care for you!   --  Invasive Cardiovascular services    Follow-Up: At [facility], you and your health needs are our priority.  As part of our continuing mission to provide you with exceptional heart care, we have created designated Provider Care Teams.  These Care Teams include your primary Cardiologist (physician) and Advanced Practice Providers (APPs -  Physician Assistants and Nurse Practitioners) who all work together to provide you with the care you need, when you need it.  We recommend signing up for the patient portal called "MyChart".  Sign up information is provided on this After Visit Summary.  MyChart is used to connect with patients for Virtual Visits (Telemedicine).  Patients are able to view lab/test results, encounter notes, upcoming appointments, etc.  Non-urgent messages can be sent to your provider as well.   To learn more about what you can do with  MyChart, go to [email or web address].

## 2019-05-07 NOTE — Progress Notes (Signed)
Cardiology Office Note:    Date:  05/07/2019   ID:  [name], DOB 01-04-1951, MRN [id number]  PCP:  Jim Low, MD  Cardiologist:  Jim Furbish, MD  Electrophysiologist:  None   Referring MD: Jim Low, MD     History of Present Illness:    [name] is a 69 y.o. male with severe underlying coronary artery disease post CABG in 2009 with continued ongoing chest pain.  Cardiac catheterization was performed on 09/21/2018 last:  Diagnostic Dominance: Right  Intervention    Multiple stents were placed in the RCA distribution at that time.  Unfortunately, he continues to have severe ongoing chest discomfort.  He is on optimal medical therapy as well.  Shortly after stent placement and discharge he felt fatigue shortness of breath.  He was denying any chest pain at that time however.  He says that this is kind of been going on since September.  He does not like taking nitroglycerin because of significant headaches.  His blood pressures also have been wildly labile.  Sometimes they are 90/70 like yesterday at home.  He feels very weak and tired at that time.  Today they were highly elevated.  Denies any bleeding syncope no loss of consciousness no orthopnea no PND.  Past Medical History:  Diagnosis Date  . Anemia   . Anxiety   . Aortic dissection (HCC)    TYPE 3  . CAD (coronary artery disease)    multiple, most recently Jan-March 2017  . Cardiac tamponade   . Chronic bronchitis ([place])   . Chronic fatigue   . Chronic pain   . Constipation   . COPD (chronic obstructive pulmonary disease) ([place])   . Depression   . Dysphagia, oropharyngeal phase   . ETOH abuse   . GERD (gastroesophageal reflux disease)   . Hiatal hernia   . HTN (hypertension)   . Hypercholesterolemia   . Metabolic encephalopathy   . Opioid abuse ([place])   . Peptic ulcer disease 08/2010   EGD   . PNA (pneumonia)   . Schatzki's ring     Past Surgical History:  Procedure Laterality Date  . BACK  SURGERY    . CABG X 6  07/31/2007   [facility]  . CARDIAC CATHETERIZATION    . CARDIAC CATHETERIZATION N/A 03/16/2015   Procedure: Coronary/Graft Angiography;  Surgeon: Sherren Mocha, MD;  Location: [place] [facility];  Service: Cardiovascular;  Laterality: N/A;  . CARDIAC CATHETERIZATION N/A 03/16/2015   Procedure: Coronary Stent Intervention;  Surgeon: Sherren Mocha, MD;  Location: [place] [facility];  Service: Cardiovascular;  Laterality: N/A;  . CORONARY STENT INTERVENTION N/A 09/21/2018   Procedure: CORONARY STENT INTERVENTION;  Surgeon: Jim Booze, MD;  Location: [place] [facility];  Service: Cardiovascular;  Laterality: N/A;  . KNEE SURGERY Right    acl  . LEFT HEART CATH AND CORS/GRAFTS ANGIOGRAPHY N/A 09/21/2018   Procedure: LEFT HEART CATH AND CORS/GRAFTS ANGIOGRAPHY;  Surgeon: Jim Booze, MD;  Location: [facility];  Service: Cardiovascular;  Laterality: N/A;  . OLECRANON BURSECTOMY Left 08/27/2012   Procedure: EXCISION LEFT OLECRANON BURSA;  Surgeon: Jim Kava, MD;  Location: [facility];  Service: Orthopedics;  Laterality: Left;  . OLECRANON BURSECTOMY Left 05/25/2013   Procedure:  Excision sinus tract and tissue left elbow ;  Surgeon: Jim Kava, MD;  Location: [facility];  Service: Orthopedics;  Laterality: Left;  . ORIF SCAPULAR FRACTURE Right   . SHOULDER SURGERY Left   .  STERNAL WIRE REMOVEAL  12/31/2007   [facility]    Current Medications: Current Meds  Medication Sig  . albuterol (VENTOLIN HFA) 108 (90 Base) MCG/ACT inhaler Inhale 2 puffs into the lungs every 6 (six) hours as needed for wheezing or shortness of breath.     Allergies:   Nsaids, Bee venom, and Codeine   Social History   Socioeconomic History  . Marital status: Married    Spouse name: Not on file  . Number of children: Not on file  . Years of education: Not on file  . Highest education level: Not on file  Occupational History  . Occupation: Dealer  Tobacco Use  .  Smoking status: Former Smoker    Packs/day: 1.00    Years: 20.00    Pack years: 20.00    Types: Cigarettes    Quit date: 12/28/2017    Years since quitting: 1.3  . Smokeless tobacco: Former Systems developer    Quit date: 06/05/2015  . Tobacco comment: 04/30/16- smoking 1 cig per day//lmr  Substance and Sexual Activity  . Alcohol use: No    Alcohol/week: 0.0 standard drinks    Comment: denies  . Drug use: No  . Sexual activity: Not on file  Other Topics Concern  . Not on file  Social History Narrative          Social Determinants of Health   Financial Resource Strain:   . Difficulty of Paying Living Expenses:   Food Insecurity:   . Worried About Charity fundraiser in the Last Year:   . Arboriculturist in the Last Year:   Transportation Needs: No Transportation Needs  . Lack of Transportation (Medical): No  . Lack of Transportation (Non-Medical): No  Physical Activity:   . Days of Exercise per Week:   . Minutes of Exercise per Session:   Stress:   . Feeling of Stress :   Social Connections:   . Frequency of Communication with Friends and Family:   . Frequency of Social Gatherings with Friends and Family:   . Attends Religious Services:   . Active Member of Clubs or Organizations:   . Attends Archivist Meetings:   Marland Kitchen Marital Status:      Family History: The patient's family history includes Asthma in his sister; Emphysema in his paternal grandfather and paternal grandmother; Heart disease in his father; Stomach cancer in his paternal grandfather.  ROS:   Please see the history of present illness.     All other systems reviewed and are negative.  EKGs/Labs/Other Studies Reviewed:    The following studies were reviewed today: See cath description above  ECHO 2019  - Left ventricle: The cavity size was normal. Wall thickness was  increased in a pattern of moderate LVH. Systolic function was  normal. The estimated ejection fraction was in the range of 55%  to  60%.  - Aortic valve: Mildly calcified annulus. Mildly thickened  leaflets. Valve area (VTI): 2.26 cm^2. Valve area (Vmax): 2.26  cm^2.  - Mitral valve: Mildly calcified annulus. Mildly thickened leaflets  .  - Technically difficult study.   EKG:  EKG is  ordered today.  The ekg ordered today demonstrates sinus rhythm 76 T wave inversions noted in 2 3 aVF, fairly small amplitude.  These T wave inversions are change in prior.  He also has subtle T wave change in V6.  Recent Labs: 11/02/2018: ALT 11 11/04/2018: BUN 16; Creatinine, Ser 0.71; Hemoglobin 11.0; Platelets 163; Potassium  4.1; Sodium 138  Recent Lipid Panel    Component Value Date/Time   CHOL 204 (H) 03/15/2015 0558   TRIG 116 04/09/2015 0802   HDL 29 (L) 03/15/2015 0558   CHOLHDL 7.0 03/15/2015 0558   VLDL 26 03/15/2015 0558   LDLCALC 149 (H) 03/15/2015 0558    Physical Exam:    VS:  BP (!) 180/110   Pulse 76   Ht 5\' 10"  (1.778 m)   Wt 167 lb 6.4 oz (75.9 kg)   SpO2 97%   BMI 24.02 kg/m     Wt Readings from Last 3 Encounters:  05/07/19 167 lb 6.4 oz (75.9 kg)  03/09/19 168 lb (76.2 kg)  11/03/18 160 lb 11.5 oz (72.9 kg)     GEN:  Well nourished, well developed in no acute distress HEENT: Normal NECK: No JVD; No carotid bruits LYMPHATICS: No lymphadenopathy CARDIAC: RRR, no murmurs, rubs, gallops RESPIRATORY:  Clear to auscultation without rales, wheezing or rhonchi  ABDOMEN: Soft, non-tender, non-distended MUSCULOSKELETAL:  No edema; No deformity  SKIN: Warm and dry NEUROLOGIC:  Alert and oriented x 3 PSYCHIATRIC:  Normal affect   ASSESSMENT:    1. Coronary artery disease, angina presence unspecified, unspecified vessel or lesion type, unspecified whether native or transplanted heart   2. Essential hypertension   3. AAA (abdominal aortic aneurysm) without rupture ([place])   4. Status post angioplasty with stent   5. Chest pain of uncertain etiology   6. Pre-procedure lab exam    PLAN:    In  order of problems listed above:  Severe angina with underlying severe coronary artery disease status post CABG and multiple PCI's last of which August 2020 to the RCA distribution -We will go ahead and proceed with cardiac catheterization.  Risks and benefits have been discussed with patient including stroke heart attack death renal impairment bleeding.  He is willing to proceed. -We have had trouble in the past utilizing long-acting nitrates.  He has severe headaches with any dose.  Type B aortic dissection -First diagnosed in 2008, descending thoracic distribution, abdominal aortic aneurysm of 4.9 cm followed by Dr. Roxan Hockey as well.  Hyperlipidemia -Atorvastatin was increased to 40 mg a day, high intensity dose on August 2020 given LDL of 93 previously.  We will repeat lipid panel.  Essential hypertension-labile -Blood pressure has been [name] normal in the past.  No syncope. --90/70 this AM, Today elevated.   Tob use  - quit 12/2018  On May 3 he states that he has a right eye cataract type surgery, lens replacement.    Medication Adjustments/Labs and Tests Ordered: Current medicines are reviewed at length with the patient today.  Concerns regarding medicines are outlined above.  Orders Placed This Encounter  Procedures  . Basic metabolic panel  . CBC  . EKG 12-Lead   No orders of the defined types were placed in this encounter.   Patient Instructions  Medication Instructions:  Your physician recommends that you continue on your current medications as directed. Please refer to the Current Medication list given to you today. Current  *If you need a refill on your cardiac medications before your next appointment, please call your pharmacy*   Lab Work: CBC and BMET today  If you have labs (blood work) drawn today and your tests are completely normal, you will receive your results only by: Marland Kitchen MyChart Message (if you have MyChart) OR . A paper copy in the mail If you have  any lab test that is  abnormal or we need to change your treatment, we will call you to review the results.   Testing/Procedures:    [place] OFFICE [place], [place] [address] Dept: [phone] Loc: [place]  05/07/2019  You are scheduled for a Cardiac Catheterization on Friday, April 23 with Dr. Larae Grooms.  1. Please arrive at the [facility] (Main Entrance A) at [facility]: [address] [place], [place] [address] [address] at 7:00 AM (This time is two hours before your procedure to ensure your preparation). Free valet parking service is available.   Special note: Every effort is made to have your procedure done on time. Please understand that emergencies sometimes delay scheduled procedures.  2. Diet: Do not eat solid foods after midnight.  The patient may have clear liquids until 5am upon the day of the procedure.  3. Labs: You will need to have blood drawn   Today CBC and BMET You do not need to be fasting.  Your Pre-procedure COVID-19 Testing will be done on 05/11/2019 at 240pm at [facility].  Please be on time for your appointment.  After your swab you will be given a mask to wear and instructed to go home and quarantine/no visitors until after your procedure. If you test positive you will be notified and your procedure will be cancelled.    On the morning of your procedure, take your Aspirin and Plavix and any morning medicines NOT listed above.  You may use sips of water.  5. Plan for one night stay--bring personal belongings. 6. Bring a current list of your medications and current insurance cards. 7. You MUST have a responsible person to drive you home. 8. Someone MUST be with you the first 24 hours after you arrive home or your discharge will be delayed. 9. Please wear clothes that are easy to get on and off and wear slip-on  shoes.  Thank you for allowing [place] to care for you!   -- [place] Invasive Cardiovascular services    Follow-Up: At [facility], you and your health needs are our priority.  As part of our continuing mission to provide you with exceptional heart care, we have created designated Provider Care Teams.  These Care Teams include your primary Cardiologist (physician) and Advanced Practice Providers (APPs -  Physician Assistants and Nurse Practitioners) who all work together to provide you with the care you need, when you need it.  We recommend signing up for the patient portal called "MyChart".  Sign up information is provided on this After Visit Summary.  MyChart is used to connect with patients for Virtual Visits (Telemedicine).  Patients are able to view lab/test results, encounter notes, upcoming appointments, etc.  Non-urgent messages can be sent to your provider as well.   To learn more about what you can do with MyChart, go to [email or web address].        Signed, Jim Furbish, MD  05/07/2019 10:21 AM    [facility]

## 2019-05-07 NOTE — H&P (View-Only) (Signed)
Cardiology Office Note:    Date:  05/07/2019   ID:  [name], DOB [birth year], MRN [id number]  PCP:  Wenda Low, MD  Cardiologist:  Candee Furbish, MD  Electrophysiologist:  None   Referring MD: Wenda Low, MD     History of Present Illness:    [name] is a 69 y.o. male with severe underlying coronary artery disease post CABG in 2009 with continued ongoing chest pain.  Cardiac catheterization was performed on 09/21/2018 last:  Diagnostic Dominance: Right  Intervention    Multiple stents were placed in the RCA distribution at that time.  Unfortunately, he continues to have severe ongoing chest discomfort.  He is on optimal medical therapy as well.  Shortly after stent placement and discharge he felt fatigue shortness of breath.  He was denying any chest pain at that time however.  He says that this is kind of been going on since September.  He does not like taking nitroglycerin because of significant headaches.  His blood pressures also have been wildly labile.  Sometimes they are 90/70 like yesterday at home.  He feels very weak and tired at that time.  Today they were highly elevated.  Denies any bleeding syncope no loss of consciousness no orthopnea no PND.  Past Medical History:  Diagnosis Date  . Anemia   . Anxiety   . Aortic dissection (HCC)    TYPE 3  . CAD (coronary artery disease)    multiple, most recently Jan-March 2017  . Cardiac tamponade   . Chronic bronchitis ([place])   . Chronic fatigue   . Chronic pain   . Constipation   . COPD (chronic obstructive pulmonary disease) ([place])   . Depression   . Dysphagia, oropharyngeal phase   . ETOH abuse   . GERD (gastroesophageal reflux disease)   . Hiatal hernia   . HTN (hypertension)   . Hypercholesterolemia   . Metabolic encephalopathy   . Opioid abuse ([place])   . Peptic ulcer disease 08/2010   EGD   . PNA (pneumonia)   . Schatzki's ring     Past Surgical History:  Procedure Laterality Date  . BACK  SURGERY    . CABG X 6  07/31/2007   [facility]  . CARDIAC CATHETERIZATION    . CARDIAC CATHETERIZATION N/A 03/16/2015   Procedure: Coronary/Graft Angiography;  Surgeon: Sherren Mocha, MD;  Location: [facility];  Service: Cardiovascular;  Laterality: N/A;  . CARDIAC CATHETERIZATION N/A 03/16/2015   Procedure: Coronary Stent Intervention;  Surgeon: Sherren Mocha, MD;  Location: [facility];  Service: Cardiovascular;  Laterality: N/A;  . CORONARY STENT INTERVENTION N/A 09/21/2018   Procedure: CORONARY STENT INTERVENTION;  Surgeon: Jettie Booze, MD;  Location: [facility];  Service: Cardiovascular;  Laterality: N/A;  . KNEE SURGERY Right    acl  . LEFT HEART CATH AND CORS/GRAFTS ANGIOGRAPHY N/A 09/21/2018   Procedure: LEFT HEART CATH AND CORS/GRAFTS ANGIOGRAPHY;  Surgeon: Jettie Booze, MD;  Location: [facility];  Service: Cardiovascular;  Laterality: N/A;  . OLECRANON BURSECTOMY Left 08/27/2012   Procedure: EXCISION LEFT OLECRANON BURSA;  Surgeon: Sanjuana Kava, MD;  Location: [facility];  Service: Orthopedics;  Laterality: Left;  . OLECRANON BURSECTOMY Left 05/25/2013   Procedure:  Excision sinus tract and tissue left elbow ;  Surgeon: Sanjuana Kava, MD;  Location: [facility];  Service: Orthopedics;  Laterality: Left;  . ORIF SCAPULAR FRACTURE Right   . SHOULDER SURGERY Left   .  STERNAL WIRE REMOVEAL  12/31/2007   [facility]    Current Medications: Current Meds  Medication Sig  . albuterol (VENTOLIN HFA) 108 (90 Base) MCG/ACT inhaler Inhale 2 puffs into the lungs every 6 (six) hours as needed for wheezing or shortness of breath.     Allergies:   Nsaids, Bee venom, and Codeine   Social History   Socioeconomic History  . Marital status: Married    Spouse name: Not on file  . Number of children: Not on file  . Years of education: Not on file  . Highest education level: Not on file  Occupational History  . Occupation: Dealer  Tobacco Use  .  Smoking status: Former Smoker    Packs/day: 1.00    Years: 20.00    Pack years: 20.00    Types: Cigarettes    Quit date: 12/28/2017    Years since quitting: 1.3  . Smokeless tobacco: Former Systems developer    Quit date: 06/05/2015  . Tobacco comment: 04/30/16- smoking 1 cig per day//lmr  Substance and Sexual Activity  . Alcohol use: No    Alcohol/week: 0.0 standard drinks    Comment: denies  . Drug use: No  . Sexual activity: Not on file  Other Topics Concern  . Not on file  Social History Narrative          Social Determinants of Health   Financial Resource Strain:   . Difficulty of Paying Living Expenses:   Food Insecurity:   . Worried About Charity fundraiser in the Last Year:   . Arboriculturist in the Last Year:   Transportation Needs: No Transportation Needs  . Lack of Transportation (Medical): No  . Lack of Transportation (Non-Medical): No  Physical Activity:   . Days of Exercise per Week:   . Minutes of Exercise per Session:   Stress:   . Feeling of Stress :   Social Connections:   . Frequency of Communication with Friends and Family:   . Frequency of Social Gatherings with Friends and Family:   . Attends Religious Services:   . Active Member of Clubs or Organizations:   . Attends Archivist Meetings:   Marland Kitchen Marital Status:      Family History: The patient's family history includes Asthma in his sister; Emphysema in his paternal grandfather and paternal grandmother; Heart disease in his father; Stomach cancer in his paternal grandfather.  ROS:   Please see the history of present illness.     All other systems reviewed and are negative.  EKGs/Labs/Other Studies Reviewed:    The following studies were reviewed today: See cath description above  ECHO 2019  - Left ventricle: The cavity size was normal. Wall thickness was  increased in a pattern of moderate LVH. Systolic function was  normal. The estimated ejection fraction was in the range of 55%  to  60%.  - Aortic valve: Mildly calcified annulus. Mildly thickened  leaflets. Valve area (VTI): 2.26 cm^2. Valve area (Vmax): 2.26  cm^2.  - Mitral valve: Mildly calcified annulus. Mildly thickened leaflets  .  - Technically difficult study.   EKG:  EKG is  ordered today.  The ekg ordered today demonstrates sinus rhythm 76 T wave inversions noted in 2 3 aVF, fairly small amplitude.  These T wave inversions are change in prior.  He also has subtle T wave change in V6.  Recent Labs: 11/02/2018: ALT 11 11/04/2018: BUN 16; Creatinine, Ser 0.71; Hemoglobin 11.0; Platelets 163; Potassium  4.1; Sodium 138  Recent Lipid Panel    Component Value Date/Time   CHOL 204 (H) 03/15/2015 0558   TRIG 116 04/09/2015 0802   HDL 29 (L) 03/15/2015 0558   CHOLHDL 7.0 03/15/2015 0558   VLDL 26 03/15/2015 0558   LDLCALC 149 (H) 03/15/2015 0558    Physical Exam:    VS:  BP (!) 180/110   Pulse 76   Ht 5\' 10"  (1.778 m)   Wt 167 lb 6.4 oz (75.9 kg)   SpO2 97%   BMI 24.02 kg/m     Wt Readings from Last 3 Encounters:  05/07/19 167 lb 6.4 oz (75.9 kg)  03/09/19 168 lb (76.2 kg)  11/03/18 160 lb 11.5 oz (72.9 kg)     GEN:  Well nourished, well developed in no acute distress HEENT: Normal NECK: No JVD; No carotid bruits LYMPHATICS: No lymphadenopathy CARDIAC: RRR, no murmurs, rubs, gallops RESPIRATORY:  Clear to auscultation without rales, wheezing or rhonchi  ABDOMEN: Soft, non-tender, non-distended MUSCULOSKELETAL:  No edema; No deformity  SKIN: Warm and dry NEUROLOGIC:  Alert and oriented x 3 PSYCHIATRIC:  Normal affect   ASSESSMENT:    1. Coronary artery disease, angina presence unspecified, unspecified vessel or lesion type, unspecified whether native or transplanted heart   2. Essential hypertension   3. AAA (abdominal aortic aneurysm) without rupture ([place])   4. Status post angioplasty with stent   5. Chest pain of uncertain etiology   6. Pre-procedure lab exam    PLAN:    In  order of problems listed above:  Severe angina with underlying severe coronary artery disease status post CABG and multiple PCI's last of which August 2020 to the RCA distribution -We will go ahead and proceed with cardiac catheterization.  Risks and benefits have been discussed with patient including stroke heart attack death renal impairment bleeding.  He is willing to proceed. -We have had trouble in the past utilizing long-acting nitrates.  He has severe headaches with any dose.  Type B aortic dissection -First diagnosed in 2008, descending thoracic distribution, abdominal aortic aneurysm of 4.9 cm followed by Dr. Roxan Hockey as well.  Hyperlipidemia -Atorvastatin was increased to 40 mg a day, high intensity dose on August 2020 given LDL of 93 previously.  We will repeat lipid panel.  Essential hypertension-labile -Blood pressure has been low normal in the past.  No syncope. --90/70 this AM, Today elevated.   Tob use  - quit 12/2018  On May 3 he states that he has a right eye cataract type surgery, lens replacement.    Medication Adjustments/Labs and Tests Ordered: Current medicines are reviewed at length with the patient today.  Concerns regarding medicines are outlined above.  Orders Placed This Encounter  Procedures  . Basic metabolic panel  . CBC  . EKG 12-Lead   No orders of the defined types were placed in this encounter.   Patient Instructions  Medication Instructions:  Your physician recommends that you continue on your current medications as directed. Please refer to the Current Medication list given to you today. Current  *If you need a refill on your cardiac medications before your next appointment, please call your pharmacy*   Lab Work: CBC and BMET today  If you have labs (blood work) drawn today and your tests are completely normal, you will receive your results only by: Marland Kitchen MyChart Message (if you have MyChart) OR . A paper copy in the mail If you have  any lab test that is  abnormal or we need to change your treatment, we will call you to review the results.   Testing/Procedures:    [facility] OFFICE [place], [place] [address] Dept: [phone] Loc: [place]  05/07/2019  You are scheduled for a Cardiac Catheterization on Friday, April 23 with Dr. Larae Grooms.  1. Please arrive at the [facility] (Main Entrance A) at [facility]: [address] [place], [place] [address] at 7:00 AM (This time is two hours before your procedure to ensure your preparation). Free valet parking service is available.   Special note: Every effort is made to have your procedure done on time. Please understand that emergencies sometimes delay scheduled procedures.  2. Diet: Do not eat solid foods after midnight.  The patient may have clear liquids until 5am upon the day of the procedure.  3. Labs: You will need to have blood drawn   Today CBC and BMET You do not need to be fasting.  Your Pre-procedure COVID-19 Testing will be done on 05/11/2019 at 240pm at [facility].  Please be on time for your appointment.  After your swab you will be given a mask to wear and instructed to go home and quarantine/no visitors until after your procedure. If you test positive you will be notified and your procedure will be cancelled.    On the morning of your procedure, take your Aspirin and Plavix and any morning medicines NOT listed above.  You may use sips of water.  5. Plan for one night stay--bring personal belongings. 6. Bring a current list of your medications and current insurance cards. 7. You MUST have a responsible person to drive you home. 8. Someone MUST be with you the first 24 hours after you arrive home or your discharge will be delayed. 9. Please wear clothes that are easy to get on and off and wear slip-on  shoes.  Thank you for allowing [place] to care for you!   -- [facility]    Follow-Up: At [facility], you and your health needs are our priority.  As part of our continuing mission to provide you with exceptional heart care, we have created designated Provider Care Teams.  These Care Teams include your primary Cardiologist (physician) and Advanced Practice Providers (APPs -  Physician Assistants and Nurse Practitioners) who all work together to provide you with the care you need, when you need it.  We recommend signing up for the patient portal called "MyChart".  Sign up information is provided on this After Visit Summary.  MyChart is used to connect with patients for Virtual Visits (Telemedicine).  Patients are able to view lab/test results, encounter notes, upcoming appointments, etc.  Non-urgent messages can be sent to your provider as well.   To learn more about what you can do with MyChart, go to [email or web address].        Signed, Candee Furbish, MD  05/07/2019 10:21 AM    [facility]

## 2019-05-10 NOTE — Addendum Note (Signed)
Addended by: Candee Furbish C on: 05/10/2019 06:59 AM   Modules accepted: Orders, SmartSet

## 2019-05-11 ENCOUNTER — Other Ambulatory Visit: Payer: Self-pay

## 2019-05-11 ENCOUNTER — Other Ambulatory Visit (HOSPITAL_COMMUNITY): Payer: Medicare HMO

## 2019-05-11 ENCOUNTER — Other Ambulatory Visit (HOSPITAL_COMMUNITY)
Admission: RE | Admit: 2019-05-11 | Discharge: 2019-05-11 | Disposition: A | Discharge status: Home / Self Care | Payer: Medicare HMO | Source: Ambulatory Visit | Attending: Interventional Cardiology | Admitting: Interventional Cardiology

## 2019-05-11 DIAGNOSIS — Z20822 Contact with and (suspected) exposure to covid-19: Secondary | ICD-10-CM | POA: Diagnosis not present

## 2019-05-11 DIAGNOSIS — Z01812 Encounter for preprocedural laboratory examination: Secondary | ICD-10-CM | POA: Diagnosis not present

## 2019-05-12 LAB — SARS CORONAVIRUS 2 (TAT 6-24 HRS): SARS Coronavirus 2: NEGATIVE

## 2019-05-14 ENCOUNTER — Encounter (HOSPITAL_COMMUNITY)
Admission: RE | Disposition: A | Discharge status: Home / Self Care | Payer: Self-pay | Source: Home / Self Care | Attending: Interventional Cardiology

## 2019-05-14 ENCOUNTER — Ambulatory Visit (HOSPITAL_COMMUNITY)
Admission: RE | Admit: 2019-05-14 | Discharge: 2019-05-14 | Disposition: A | Discharge status: Home / Self Care | Payer: Medicare HMO | Attending: Interventional Cardiology | Admitting: Interventional Cardiology

## 2019-05-14 DIAGNOSIS — Z955 Presence of coronary angioplasty implant and graft: Secondary | ICD-10-CM | POA: Diagnosis not present

## 2019-05-14 DIAGNOSIS — I10 Essential (primary) hypertension: Secondary | ICD-10-CM | POA: Insufficient documentation

## 2019-05-14 DIAGNOSIS — Z87891 Personal history of nicotine dependence: Secondary | ICD-10-CM | POA: Insufficient documentation

## 2019-05-14 DIAGNOSIS — Z886 Allergy status to analgesic agent status: Secondary | ICD-10-CM | POA: Diagnosis not present

## 2019-05-14 DIAGNOSIS — I25118 Atherosclerotic heart disease of native coronary artery with other forms of angina pectoris: Secondary | ICD-10-CM | POA: Insufficient documentation

## 2019-05-14 DIAGNOSIS — Z951 Presence of aortocoronary bypass graft: Secondary | ICD-10-CM | POA: Insufficient documentation

## 2019-05-14 DIAGNOSIS — Z825 Family history of asthma and other chronic lower respiratory diseases: Secondary | ICD-10-CM | POA: Insufficient documentation

## 2019-05-14 DIAGNOSIS — I714 Abdominal aortic aneurysm, without rupture: Secondary | ICD-10-CM | POA: Diagnosis not present

## 2019-05-14 DIAGNOSIS — E7800 Pure hypercholesterolemia, unspecified: Secondary | ICD-10-CM | POA: Insufficient documentation

## 2019-05-14 DIAGNOSIS — Z885 Allergy status to narcotic agent status: Secondary | ICD-10-CM | POA: Insufficient documentation

## 2019-05-14 DIAGNOSIS — J449 Chronic obstructive pulmonary disease, unspecified: Secondary | ICD-10-CM | POA: Diagnosis not present

## 2019-05-14 DIAGNOSIS — I25718 Atherosclerosis of autologous vein coronary artery bypass graft(s) with other forms of angina pectoris: Secondary | ICD-10-CM | POA: Diagnosis not present

## 2019-05-14 DIAGNOSIS — K219 Gastro-esophageal reflux disease without esophagitis: Secondary | ICD-10-CM | POA: Insufficient documentation

## 2019-05-14 DIAGNOSIS — I2584 Coronary atherosclerosis due to calcified coronary lesion: Secondary | ICD-10-CM | POA: Diagnosis not present

## 2019-05-14 DIAGNOSIS — Z8249 Family history of ischemic heart disease and other diseases of the circulatory system: Secondary | ICD-10-CM | POA: Insufficient documentation

## 2019-05-14 DIAGNOSIS — E785 Hyperlipidemia, unspecified: Secondary | ICD-10-CM | POA: Diagnosis not present

## 2019-05-14 DIAGNOSIS — I2510 Atherosclerotic heart disease of native coronary artery without angina pectoris: Secondary | ICD-10-CM

## 2019-05-14 HISTORY — PX: LEFT HEART CATH AND CORS/GRAFTS ANGIOGRAPHY: CATH118250

## 2019-05-14 SURGERY — LEFT HEART CATH AND CORS/GRAFTS ANGIOGRAPHY
Anesthesia: LOCAL

## 2019-05-14 MED ORDER — SODIUM CHLORIDE 0.9% FLUSH: 3.0000 mL | INTRAVENOUS | Status: DC | PRN

## 2019-05-14 MED ORDER — MIDAZOLAM HCL 2 MG/2ML IJ SOLN
INTRAMUSCULAR | Status: AC
  Filled 2019-05-14: qty 2

## 2019-05-14 MED ORDER — FENTANYL CITRATE (PF) 100 MCG/2ML IJ SOLN
INTRAMUSCULAR | Status: AC
  Filled 2019-05-14: qty 2

## 2019-05-14 MED ORDER — ACETAMINOPHEN 325 MG PO TABS: 650.0000 mg | ORAL_TABLET | ORAL | Status: DC | PRN

## 2019-05-14 MED ORDER — HYDRALAZINE HCL 20 MG/ML IJ SOLN
INTRAMUSCULAR | Status: AC
  Filled 2019-05-14: qty 1

## 2019-05-14 MED ORDER — ONDANSETRON HCL 4 MG/2ML IJ SOLN: 4.0000 mg | Freq: Four times a day (QID) | INTRAMUSCULAR | Status: DC | PRN

## 2019-05-14 MED ORDER — HEPARIN (PORCINE) IN NACL 1000-0.9 UT/500ML-% IV SOLN
INTRAVENOUS | Status: AC
  Filled 2019-05-14: qty 500

## 2019-05-14 MED ORDER — SODIUM CHLORIDE 0.9 % WEIGHT BASED INFUSION: 1.0000 mL/kg/h | INTRAVENOUS | Status: DC

## 2019-05-14 MED ORDER — HEPARIN (PORCINE) IN NACL 1000-0.9 UT/500ML-% IV SOLN
INTRAVENOUS | Status: DC | PRN
  Administered 2019-05-14: 500 mL

## 2019-05-14 MED ORDER — SODIUM CHLORIDE 0.9% FLUSH: 3.0000 mL | Freq: Two times a day (BID) | INTRAVENOUS | Status: DC

## 2019-05-14 MED ORDER — ASPIRIN 81 MG PO CHEW: 81.0000 mg | CHEWABLE_TABLET | ORAL | Status: DC

## 2019-05-14 MED ORDER — LIDOCAINE HCL (PF) 1 % IJ SOLN
INTRAMUSCULAR | Status: AC
  Filled 2019-05-14: qty 30

## 2019-05-14 MED ORDER — SODIUM CHLORIDE 0.9 % IV SOLN: 250.0000 mL | INTRAVENOUS | Status: DC | PRN

## 2019-05-14 MED ORDER — FENTANYL CITRATE (PF) 100 MCG/2ML IJ SOLN
INTRAMUSCULAR | Status: DC | PRN
  Administered 2019-05-14: 50 ug via INTRAVENOUS
  Administered 2019-05-14: 25 ug via INTRAVENOUS

## 2019-05-14 MED ORDER — LIDOCAINE HCL (PF) 1 % IJ SOLN
INTRAMUSCULAR | Status: DC | PRN
  Administered 2019-05-14: 15 mL

## 2019-05-14 MED ORDER — HYDRALAZINE HCL 20 MG/ML IJ SOLN
INTRAMUSCULAR | Status: DC | PRN
  Administered 2019-05-14: 10 mg via INTRAVENOUS

## 2019-05-14 MED ORDER — LABETALOL HCL 5 MG/ML IV SOLN: 10.0000 mg | INTRAVENOUS | Status: DC | PRN

## 2019-05-14 MED ORDER — SODIUM CHLORIDE 0.9 % IV SOLN: INTRAVENOUS | Status: DC

## 2019-05-14 MED ORDER — MIDAZOLAM HCL 2 MG/2ML IJ SOLN
INTRAMUSCULAR | Status: DC | PRN
  Administered 2019-05-14: 2 mg via INTRAVENOUS
  Administered 2019-05-14: 1 mg via INTRAVENOUS

## 2019-05-14 MED ORDER — HYDRALAZINE HCL 20 MG/ML IJ SOLN: 10.0000 mg | INTRAMUSCULAR | Status: DC | PRN

## 2019-05-14 MED ORDER — IOHEXOL 350 MG/ML SOLN
INTRAVENOUS | Status: AC
  Filled 2019-05-14: qty 1

## 2019-05-14 MED ORDER — SODIUM CHLORIDE 0.9 % WEIGHT BASED INFUSION
3.0000 mL/kg/h | INTRAVENOUS | Status: AC
  Administered 2019-05-14: 08:00:00 3 mL/kg/h via INTRAVENOUS

## 2019-05-14 MED ORDER — IOHEXOL 350 MG/ML SOLN
INTRAVENOUS | Status: DC | PRN
  Administered 2019-05-14: 105 mL

## 2019-05-14 SURGICAL SUPPLY — 12 items
CATH INFINITI 5 FR LCB (CATHETERS) ×1 IMPLANT
CATH INFINITI 5FR AL1 (CATHETERS) ×1 IMPLANT
CATH INFINITI 5FR MULTPACK ANG (CATHETERS) ×1 IMPLANT
CLOSURE MYNX CONTROL 5F (Vascular Products) ×1 IMPLANT
KIT HEART LEFT (KITS) ×2 IMPLANT
PACK CARDIAC CATHETERIZATION (CUSTOM PROCEDURE TRAY) ×2 IMPLANT
SHEATH PINNACLE 5F 10CM (SHEATH) ×1 IMPLANT
SHEATH PROBE COVER 6X72 (BAG) ×1 IMPLANT
TRANSDUCER W/STOPCOCK (MISCELLANEOUS) ×2 IMPLANT
TUBING CIL FLEX 10 FLL-RA (TUBING) ×2 IMPLANT
WIRE EMERALD 3MM-J .035X150CM (WIRE) ×1 IMPLANT
WIRE HI TORQ VERSACORE-J 145CM (WIRE) ×1 IMPLANT

## 2019-05-14 NOTE — Discharge Instructions (Signed)
Femoral Site Care This sheet gives you information about how to care for yourself after your procedure. Your health care provider may also give you more specific instructions. If you have problems or questions, contact your health care provider. What can I expect after the procedure? After the procedure, it is common to have:  Bruising that usually fades within 1-2 weeks.  Tenderness at the site. Follow these instructions at home: Wound care  Follow instructions from your health care provider about how to take care of your insertion site. Make sure you: ? Wash your hands with soap and water before you change your bandage (dressing). If soap and water are not available, use hand sanitizer. ? Change your dressing as told by your health care provider. ? Leave stitches (sutures), skin glue, or adhesive strips in place. These skin closures may need to stay in place for 2 weeks or longer. If adhesive strip edges start to loosen and curl up, you may trim the loose edges. Do not remove adhesive strips completely unless your health care provider tells you to do that.  Do not take baths, swim, or use a hot tub until your health care provider approves.  You may shower 24-48 hours after the procedure or as told by your health care provider. ? Gently wash the site with plain soap and water. ? Pat the area dry with a clean towel. ? Do not rub the site. This may cause bleeding.  Do not apply powder or lotion to the site. Keep the site clean and dry.  Check your femoral site every day for signs of infection. Check for: ? Redness, swelling, or pain. ? Fluid or blood. ? Warmth. ? Pus or a bad smell. Activity  For the first 2-3 days after your procedure, or as long as directed: ? Avoid climbing stairs as much as possible. ? Do not squat.  Do not lift anything that is heavier than 10 lb (4.5 kg), or the limit that you are told, until your health care provider says that it is safe.  Rest as  directed. ? Avoid sitting for a long time without moving. Get up to take short walks every 1-2 hours.  Do not drive for 24 hours if you were given a medicine to help you relax (sedative). General instructions  Take over-the-counter and prescription medicines only as told by your health care provider.  Keep all follow-up visits as told by your health care provider. This is important. Contact a health care provider if you have:  A fever or chills.  You have redness, swelling, or pain around your insertion site. Get help right away if:  The catheter insertion area swells very fast.  You pass out.  You suddenly start to sweat or your skin gets clammy.  The catheter insertion area is bleeding, and the bleeding does not stop when you hold steady pressure on the area.  The area near or just beyond the catheter insertion site becomes pale, cool, tingly, or numb. These symptoms may represent a serious problem that is an emergency. Do not wait to see if the symptoms will go away. Get medical help right away. Call your local emergency services (911 in the [place] Do not drive yourself to the hospital. Summary  After the procedure, it is common to have bruising that usually fades within 1-2 weeks.  Check your femoral site every day for signs of infection.  Do not lift anything that is heavier than 10 lb (4.5 kg), or the   limit that you are told, until your health care provider says that it is safe. This information is not intended to replace advice given to you by your health care provider. Make sure you discuss any questions you have with your health care provider. Document Revised: 01/20/2017 Document Reviewed: 01/20/2017 Elsevier Patient Education  2020 Elsevier Inc.  

## 2019-05-14 NOTE — Interval H&P Note (Signed)
Cath Lab Visit (complete for each Cath Lab visit)  Clinical Evaluation Leading to the Procedure:   ACS: No.  Non-ACS:    Anginal Classification: CCS III  Anti-ischemic medical therapy: Minimal Therapy (1 class of medications)  Non-Invasive Test Results: No non-invasive testing performed  Prior CABG: Previous CABG      History and Physical Interval Note:  05/14/2019 8:52 AM  [name]  has presented today for surgery, with the diagnosis of chest pain.  The various methods of treatment have been discussed with the patient and family. After consideration of risks, benefits and other options for treatment, the patient has consented to  Procedure(s): LEFT HEART CATH AND CORONARY ANGIOGRAPHY (N/A) as a surgical intervention.  The patient's history has been reviewed, patient examined, no change in status, stable for surgery.  I have reviewed the patient's chart and labs.  Questions were answered to the patient's satisfaction.     Larae Grooms

## 2019-05-21 DIAGNOSIS — F329 Major depressive disorder, single episode, unspecified: Secondary | ICD-10-CM | POA: Diagnosis not present

## 2019-05-21 DIAGNOSIS — I214 Non-ST elevation (NSTEMI) myocardial infarction: Secondary | ICD-10-CM | POA: Diagnosis not present

## 2019-05-21 DIAGNOSIS — E039 Hypothyroidism, unspecified: Secondary | ICD-10-CM | POA: Diagnosis not present

## 2019-05-21 DIAGNOSIS — I2510 Atherosclerotic heart disease of native coronary artery without angina pectoris: Secondary | ICD-10-CM | POA: Diagnosis not present

## 2019-05-21 DIAGNOSIS — I10 Essential (primary) hypertension: Secondary | ICD-10-CM | POA: Diagnosis not present

## 2019-05-21 DIAGNOSIS — J449 Chronic obstructive pulmonary disease, unspecified: Secondary | ICD-10-CM | POA: Diagnosis not present

## 2019-05-21 DIAGNOSIS — D649 Anemia, unspecified: Secondary | ICD-10-CM | POA: Diagnosis not present

## 2019-05-21 DIAGNOSIS — I25810 Atherosclerosis of coronary artery bypass graft(s) without angina pectoris: Secondary | ICD-10-CM | POA: Diagnosis not present

## 2019-05-21 DIAGNOSIS — J441 Chronic obstructive pulmonary disease with (acute) exacerbation: Secondary | ICD-10-CM | POA: Diagnosis not present

## 2019-05-27 ENCOUNTER — Other Ambulatory Visit: Payer: Self-pay

## 2019-05-27 NOTE — Patient Outreach (Signed)
[facility]) Care Management  05/27/2019  METHOD SATER September 05, 1950 [id number]   Telephone Assessment    Outreach attempt to patient. No answer after multiple rings and unable to leave message.     Plan: RN CM will make outreach attempt to patient within the month of June if no return call.    [name], RN,BSN,CCM [place] Management Telephonic Care Management Coordinator Direct Phone: [phone] Toll Free: [phone] Fax: [phone]

## 2019-06-02 ENCOUNTER — Ambulatory Visit: Payer: Medicare HMO | Admitting: Cardiology

## 2019-06-08 DIAGNOSIS — J449 Chronic obstructive pulmonary disease, unspecified: Secondary | ICD-10-CM | POA: Diagnosis not present

## 2019-06-08 DIAGNOSIS — I214 Non-ST elevation (NSTEMI) myocardial infarction: Secondary | ICD-10-CM | POA: Diagnosis not present

## 2019-06-08 DIAGNOSIS — E039 Hypothyroidism, unspecified: Secondary | ICD-10-CM | POA: Diagnosis not present

## 2019-06-08 DIAGNOSIS — D649 Anemia, unspecified: Secondary | ICD-10-CM | POA: Diagnosis not present

## 2019-06-08 DIAGNOSIS — J441 Chronic obstructive pulmonary disease with (acute) exacerbation: Secondary | ICD-10-CM | POA: Diagnosis not present

## 2019-06-08 DIAGNOSIS — I10 Essential (primary) hypertension: Secondary | ICD-10-CM | POA: Diagnosis not present

## 2019-06-08 DIAGNOSIS — I2510 Atherosclerotic heart disease of native coronary artery without angina pectoris: Secondary | ICD-10-CM | POA: Diagnosis not present

## 2019-06-08 DIAGNOSIS — F324 Major depressive disorder, single episode, in partial remission: Secondary | ICD-10-CM | POA: Diagnosis not present

## 2019-06-08 DIAGNOSIS — I25810 Atherosclerosis of coronary artery bypass graft(s) without angina pectoris: Secondary | ICD-10-CM | POA: Diagnosis not present

## 2019-06-16 DIAGNOSIS — I714 Abdominal aortic aneurysm, without rupture: Secondary | ICD-10-CM | POA: Diagnosis not present

## 2019-06-16 DIAGNOSIS — F411 Generalized anxiety disorder: Secondary | ICD-10-CM | POA: Diagnosis not present

## 2019-06-16 DIAGNOSIS — E7800 Pure hypercholesterolemia, unspecified: Secondary | ICD-10-CM | POA: Diagnosis not present

## 2019-06-16 DIAGNOSIS — F324 Major depressive disorder, single episode, in partial remission: Secondary | ICD-10-CM | POA: Diagnosis not present

## 2019-06-16 DIAGNOSIS — I25810 Atherosclerosis of coronary artery bypass graft(s) without angina pectoris: Secondary | ICD-10-CM | POA: Diagnosis not present

## 2019-06-16 DIAGNOSIS — G894 Chronic pain syndrome: Secondary | ICD-10-CM | POA: Diagnosis not present

## 2019-06-16 DIAGNOSIS — E039 Hypothyroidism, unspecified: Secondary | ICD-10-CM | POA: Diagnosis not present

## 2019-06-16 DIAGNOSIS — J449 Chronic obstructive pulmonary disease, unspecified: Secondary | ICD-10-CM | POA: Diagnosis not present

## 2019-06-16 DIAGNOSIS — I700 Atherosclerosis of aorta: Secondary | ICD-10-CM | POA: Diagnosis not present

## 2019-06-18 ENCOUNTER — Ambulatory Visit: Payer: Medicare HMO | Admitting: Cardiology

## 2019-07-05 ENCOUNTER — Other Ambulatory Visit: Payer: Self-pay

## 2019-07-05 NOTE — Patient Outreach (Signed)
[facility]) Care Management  07/05/2019  AMIRR ACHORD July 17, 1950 [id number]   Telephone Assessment    Outreach attempt to patient. No answer at present-call went straight to voicemail.      Plan: RN CM will make outreach attempt to patient within the month of August if no return call.   [name], RN,BSN,CCM [place] Management Telephonic Care Management Coordinator Direct Phone: [phone] Toll Free: [phone] Fax: [phone]

## 2019-07-06 ENCOUNTER — Telehealth: Payer: Self-pay | Admitting: Cardiology

## 2019-07-06 MED ORDER — ISOSORBIDE MONONITRATE ER 30 MG PO TB24
30.0000 mg | ORAL_TABLET | Freq: Every day | ORAL | 3 refills | Status: DC
Start: 2019-07-06 — End: 2020-06-21

## 2019-07-06 NOTE — Telephone Encounter (Signed)
Patient had recent heart cath 05/14/19. Patient had several follow ups afterwards but canceled and no showed. Will forward to [name] for advisement on managing chest pain.

## 2019-07-06 NOTE — Telephone Encounter (Signed)
Pt c/o of Chest Pain: STAT if CP now or developed within 24 hours  1. Are you having CP right now? Yes   2. Are you experiencing any other symptoms (ex. SOB, nausea, vomiting, sweating)? Random left arm pain   3. How long have you been experiencing CP? Since before cath   4. Is your CP continuous or coming and going? Coming and going   5. Have you taken Nitroglycerin? No   Lovena Le from [facility] is calling stating [name] was seen in their office today and was complaining of CP. She states he stated it has been occurring since before his Cath on 05/14/19 as well as random left arm pain. From what Mckenna told them the CP occurs upon extortion and he has not taken any Nitroglycerin in regards to it. Lovena Le is requesting [facility] be contacted to try and schedule an appointment in regards to this. She also stated she will be calling Rishikesh to inform him our office will be giving him a call. Please advise. ?

## 2019-07-06 NOTE — Telephone Encounter (Signed)
Left message for [name] with [facility] to call back. Called patient to let him know of [name] advisement. Patient agreed to try the medication and see if it helps.

## 2019-07-06 NOTE — Telephone Encounter (Signed)
Start isosorbide 30 mg once a day. Candee Furbish, MD

## 2019-07-07 ENCOUNTER — Telehealth: Payer: Self-pay | Admitting: Cardiology

## 2019-07-07 NOTE — Telephone Encounter (Signed)
[place] Up:    [name] is returning a call from yesterday.

## 2019-07-07 NOTE — Telephone Encounter (Signed)
Left message for [name] to call back.  

## 2019-07-07 NOTE — Telephone Encounter (Signed)
Lovena Le returned call. Informed her that [name] prescribed Imdur 30 mg to help with patient's CP. Lovena Le wanted to see if patient could get an appointment. Schedule patient with [name] at the end of July. Lovena Le stated she would let patient know. Will send message to [name]' nurse so she is aware.

## 2019-07-08 ENCOUNTER — Ambulatory Visit: Payer: Self-pay

## 2019-07-10 DIAGNOSIS — E7800 Pure hypercholesterolemia, unspecified: Secondary | ICD-10-CM | POA: Diagnosis not present

## 2019-07-10 DIAGNOSIS — I2510 Atherosclerotic heart disease of native coronary artery without angina pectoris: Secondary | ICD-10-CM | POA: Diagnosis not present

## 2019-07-10 DIAGNOSIS — F329 Major depressive disorder, single episode, unspecified: Secondary | ICD-10-CM | POA: Diagnosis not present

## 2019-07-10 DIAGNOSIS — I214 Non-ST elevation (NSTEMI) myocardial infarction: Secondary | ICD-10-CM | POA: Diagnosis not present

## 2019-07-10 DIAGNOSIS — E039 Hypothyroidism, unspecified: Secondary | ICD-10-CM | POA: Diagnosis not present

## 2019-07-10 DIAGNOSIS — I10 Essential (primary) hypertension: Secondary | ICD-10-CM | POA: Diagnosis not present

## 2019-07-10 DIAGNOSIS — I25810 Atherosclerosis of coronary artery bypass graft(s) without angina pectoris: Secondary | ICD-10-CM | POA: Diagnosis not present

## 2019-07-10 DIAGNOSIS — J449 Chronic obstructive pulmonary disease, unspecified: Secondary | ICD-10-CM | POA: Diagnosis not present

## 2019-07-10 DIAGNOSIS — D649 Anemia, unspecified: Secondary | ICD-10-CM | POA: Diagnosis not present

## 2019-08-06 ENCOUNTER — Other Ambulatory Visit: Payer: Self-pay | Admitting: *Deleted

## 2019-08-06 DIAGNOSIS — I25810 Atherosclerosis of coronary artery bypass graft(s) without angina pectoris: Secondary | ICD-10-CM | POA: Diagnosis not present

## 2019-08-06 DIAGNOSIS — I10 Essential (primary) hypertension: Secondary | ICD-10-CM | POA: Diagnosis not present

## 2019-08-06 DIAGNOSIS — D649 Anemia, unspecified: Secondary | ICD-10-CM | POA: Diagnosis not present

## 2019-08-06 DIAGNOSIS — E7800 Pure hypercholesterolemia, unspecified: Secondary | ICD-10-CM | POA: Diagnosis not present

## 2019-08-06 DIAGNOSIS — E039 Hypothyroidism, unspecified: Secondary | ICD-10-CM | POA: Diagnosis not present

## 2019-08-06 DIAGNOSIS — I2510 Atherosclerotic heart disease of native coronary artery without angina pectoris: Secondary | ICD-10-CM | POA: Diagnosis not present

## 2019-08-06 DIAGNOSIS — I214 Non-ST elevation (NSTEMI) myocardial infarction: Secondary | ICD-10-CM | POA: Diagnosis not present

## 2019-08-06 DIAGNOSIS — I71012 Dissection of descending thoracic aorta: Secondary | ICD-10-CM

## 2019-08-06 DIAGNOSIS — J449 Chronic obstructive pulmonary disease, unspecified: Secondary | ICD-10-CM | POA: Diagnosis not present

## 2019-08-06 DIAGNOSIS — F329 Major depressive disorder, single episode, unspecified: Secondary | ICD-10-CM | POA: Diagnosis not present

## 2019-08-19 ENCOUNTER — Ambulatory Visit: Payer: Medicare HMO | Admitting: Cardiology

## 2019-08-19 DIAGNOSIS — R6889 Other general symptoms and signs: Secondary | ICD-10-CM | POA: Diagnosis not present

## 2019-09-02 ENCOUNTER — Other Ambulatory Visit: Payer: Medicare HMO

## 2019-09-06 ENCOUNTER — Other Ambulatory Visit: Payer: Self-pay

## 2019-09-06 DIAGNOSIS — I214 Non-ST elevation (NSTEMI) myocardial infarction: Secondary | ICD-10-CM | POA: Diagnosis not present

## 2019-09-06 DIAGNOSIS — F329 Major depressive disorder, single episode, unspecified: Secondary | ICD-10-CM | POA: Diagnosis not present

## 2019-09-06 DIAGNOSIS — I25810 Atherosclerosis of coronary artery bypass graft(s) without angina pectoris: Secondary | ICD-10-CM | POA: Diagnosis not present

## 2019-09-06 DIAGNOSIS — D649 Anemia, unspecified: Secondary | ICD-10-CM | POA: Diagnosis not present

## 2019-09-06 DIAGNOSIS — J441 Chronic obstructive pulmonary disease with (acute) exacerbation: Secondary | ICD-10-CM | POA: Diagnosis not present

## 2019-09-06 DIAGNOSIS — I10 Essential (primary) hypertension: Secondary | ICD-10-CM | POA: Diagnosis not present

## 2019-09-06 DIAGNOSIS — J449 Chronic obstructive pulmonary disease, unspecified: Secondary | ICD-10-CM | POA: Diagnosis not present

## 2019-09-06 DIAGNOSIS — E7800 Pure hypercholesterolemia, unspecified: Secondary | ICD-10-CM | POA: Diagnosis not present

## 2019-09-06 DIAGNOSIS — E039 Hypothyroidism, unspecified: Secondary | ICD-10-CM | POA: Diagnosis not present

## 2019-09-06 NOTE — Patient Outreach (Signed)
[place] [facility]) Care Management  09/06/2019  [name] [birth year] [id number]   Telephone Assessment Quarterly Call    Outreach attempt to patient. No answer after several rings.     Plan: RN CM will make quarterly outreach attempt to patient within the month of Nov.    Coady Train [facility] [place] Management Telephonic Care Management Coordinator Direct Phone: [phone] Toll Free: [phone] Fax: [phone]

## 2019-09-07 ENCOUNTER — Other Ambulatory Visit: Payer: Medicare HMO

## 2019-09-07 ENCOUNTER — Encounter: Payer: Medicare HMO | Admitting: Thoracic Surgery (Cardiothoracic Vascular Surgery)

## 2019-09-09 ENCOUNTER — Ambulatory Visit: Payer: Self-pay

## 2019-09-14 DIAGNOSIS — J9611 Chronic respiratory failure with hypoxia: Secondary | ICD-10-CM | POA: Diagnosis not present

## 2019-09-14 DIAGNOSIS — I2510 Atherosclerotic heart disease of native coronary artery without angina pectoris: Secondary | ICD-10-CM | POA: Diagnosis not present

## 2019-09-14 DIAGNOSIS — M48061 Spinal stenosis, lumbar region without neurogenic claudication: Secondary | ICD-10-CM | POA: Diagnosis not present

## 2019-09-14 DIAGNOSIS — G894 Chronic pain syndrome: Secondary | ICD-10-CM | POA: Diagnosis not present

## 2019-09-14 DIAGNOSIS — K219 Gastro-esophageal reflux disease without esophagitis: Secondary | ICD-10-CM | POA: Diagnosis not present

## 2019-09-14 DIAGNOSIS — I739 Peripheral vascular disease, unspecified: Secondary | ICD-10-CM | POA: Diagnosis not present

## 2019-09-14 DIAGNOSIS — J449 Chronic obstructive pulmonary disease, unspecified: Secondary | ICD-10-CM | POA: Diagnosis not present

## 2019-09-14 DIAGNOSIS — R6889 Other general symptoms and signs: Secondary | ICD-10-CM | POA: Diagnosis not present

## 2019-09-14 DIAGNOSIS — I7101 Dissection of thoracic aorta: Secondary | ICD-10-CM | POA: Diagnosis not present

## 2019-09-14 DIAGNOSIS — Z0000 Encounter for general adult medical examination without abnormal findings: Secondary | ICD-10-CM | POA: Diagnosis not present

## 2019-09-14 DIAGNOSIS — F331 Major depressive disorder, recurrent, moderate: Secondary | ICD-10-CM | POA: Diagnosis not present

## 2019-09-14 DIAGNOSIS — F322 Major depressive disorder, single episode, severe without psychotic features: Secondary | ICD-10-CM | POA: Diagnosis not present

## 2019-09-14 DIAGNOSIS — E039 Hypothyroidism, unspecified: Secondary | ICD-10-CM | POA: Diagnosis not present

## 2019-09-14 DIAGNOSIS — I10 Essential (primary) hypertension: Secondary | ICD-10-CM | POA: Diagnosis not present

## 2019-09-14 DIAGNOSIS — Z125 Encounter for screening for malignant neoplasm of prostate: Secondary | ICD-10-CM | POA: Diagnosis not present

## 2019-09-14 DIAGNOSIS — F411 Generalized anxiety disorder: Secondary | ICD-10-CM | POA: Diagnosis not present

## 2019-09-14 DIAGNOSIS — I700 Atherosclerosis of aorta: Secondary | ICD-10-CM | POA: Diagnosis not present

## 2019-09-14 DIAGNOSIS — E7800 Pure hypercholesterolemia, unspecified: Secondary | ICD-10-CM | POA: Diagnosis not present

## 2019-09-21 ENCOUNTER — Inpatient Hospital Stay: Admission: RE | Admit: 2019-09-21 | Payer: Medicare HMO | Source: Ambulatory Visit

## 2019-09-21 ENCOUNTER — Encounter: Payer: Medicare HMO | Admitting: Thoracic Surgery (Cardiothoracic Vascular Surgery)

## 2019-09-21 ENCOUNTER — Other Ambulatory Visit: Payer: Medicare HMO

## 2019-09-28 ENCOUNTER — Ambulatory Visit
Admission: RE | Admit: 2019-09-28 | Discharge: 2019-09-28 | Disposition: A | Discharge status: Home / Self Care | Payer: Medicare HMO | Source: Ambulatory Visit | Attending: Thoracic Surgery (Cardiothoracic Vascular Surgery) | Admitting: Thoracic Surgery (Cardiothoracic Vascular Surgery)

## 2019-09-28 ENCOUNTER — Encounter: Payer: Self-pay | Admitting: Thoracic Surgery (Cardiothoracic Vascular Surgery)

## 2019-09-28 ENCOUNTER — Ambulatory Visit (INDEPENDENT_AMBULATORY_CARE_PROVIDER_SITE_OTHER): Payer: Medicare HMO | Admitting: Thoracic Surgery (Cardiothoracic Vascular Surgery)

## 2019-09-28 ENCOUNTER — Ambulatory Visit: Payer: Medicare HMO | Admitting: Cardiology

## 2019-09-28 VITALS — BP 136/80 | HR 76 | Temp 97.6°F | Resp 20 | Ht 70.0 in | Wt 165.0 lb

## 2019-09-28 DIAGNOSIS — I71012 Dissection of descending thoracic aorta: Secondary | ICD-10-CM

## 2019-09-28 DIAGNOSIS — I7101 Dissection of thoracic aorta: Secondary | ICD-10-CM

## 2019-09-28 DIAGNOSIS — I71019 Dissection of thoracic aorta, unspecified: Secondary | ICD-10-CM

## 2019-09-28 DIAGNOSIS — I714 Abdominal aortic aneurysm, without rupture: Secondary | ICD-10-CM | POA: Diagnosis not present

## 2019-09-28 DIAGNOSIS — I7102 Dissection of abdominal aorta: Secondary | ICD-10-CM | POA: Diagnosis not present

## 2019-09-28 DIAGNOSIS — I7123 Aneurysm of the descending thoracic aorta, without rupture: Secondary | ICD-10-CM

## 2019-09-28 DIAGNOSIS — I712 Thoracic aortic aneurysm, without rupture: Secondary | ICD-10-CM | POA: Diagnosis not present

## 2019-09-28 DIAGNOSIS — I700 Atherosclerosis of aorta: Secondary | ICD-10-CM | POA: Diagnosis not present

## 2019-09-28 MED ORDER — IOPAMIDOL (ISOVUE-370) INJECTION 76%
75.0000 mL | Freq: Once | INTRAVENOUS | Status: AC | PRN
  Administered 2019-09-28: 75 mL via INTRAVENOUS

## 2019-09-28 NOTE — Progress Notes (Signed)
[address]       [place] [address]             [phone]     HPI: Mr. Lovern returns for scheduled follow-up visit  [name] is a 69 year old man with a history of CAD, CABG in 2009, drug-eluting stent placement 2020, type III aortic dissection 2008, descending thoracic aneurysm, infrarenal abdominal aortic aneurysm, tobacco abuse (quit 2019), COPD, chronic back pain, anxiety, and depression.  I last saw him in the office 6 months ago.  At that time his primary complaint was severe fatigue.  In the interim since his last visit, he had a repeat cardiac catheterization.  There was no significant stenosis to intervene on.  He is not having any chest pain.  He continues to complain of some fatigue although it may not be as bad as it was 6 months ago.  His chronic back pain is unchanged.  Past Medical History:  Diagnosis Date  . Anemia   . Anxiety   . Aortic dissection (HCC)    TYPE 3  . CAD (coronary artery disease)    multiple, most recently Jan-March 2017  . Cardiac tamponade   . Chronic bronchitis ([place])   . Chronic fatigue   . Chronic pain   . Constipation   . COPD (chronic obstructive pulmonary disease) ([place])   . Depression   . Dysphagia, oropharyngeal phase   . ETOH abuse   . GERD (gastroesophageal reflux disease)   . Hiatal hernia   . HTN (hypertension)   . Hypercholesterolemia   . Metabolic encephalopathy   . Opioid abuse ([place])   . Peptic ulcer disease 08/2010   EGD   . PNA (pneumonia)   . Schatzki's ring     Current Outpatient Medications  Medication Sig Dispense Refill  . acetaminophen (TYLENOL) 500 MG tablet Take 500 mg by mouth every 6 (six) hours as needed for mild pain or headache.    . albuterol (PROVENTIL) (2.5 MG/3ML) 0.083% nebulizer solution Take 3 mLs (2.5 mg total) by nebulization every 6 (six) hours as needed for wheezing or shortness of breath.    Marland Kitchen albuterol (VENTOLIN HFA) 108 (90 Base) MCG/ACT inhaler Inhale 2 puffs into  the lungs every 6 (six) hours as needed for wheezing or shortness of breath. 18 g 2  . ALPRAZolam (XANAX) 0.5 MG tablet Take 0.5 mg by mouth 2 (two) times daily as needed for anxiety.     Marland Kitchen amLODipine (NORVASC) 5 MG tablet Take 5 mg by mouth at bedtime.     Marland Kitchen aspirin EC 81 MG tablet Take 1 tablet (81 mg total) by mouth daily. 30 tablet 12  . atorvastatin (LIPITOR) 40 MG tablet Take 1 tablet (40 mg total) by mouth daily. 90 tablet 3  . Budeson-Glycopyrrol-Formoterol (BREZTRI AEROSPHERE) 160-9-4.8 MCG/ACT AERO Inhale 2 puffs into the lungs 2 (two) times daily.    . busPIRone (BUSPAR) 7.5 MG tablet Take 1 tablet (7.5 mg total) by mouth 3 (three) times daily. 45 tablet 0  . clopidogrel (PLAVIX) 75 MG tablet Take 1 tablet (75 mg total) by mouth daily. 90 tablet 2  . escitalopram (LEXAPRO) 5 MG tablet Take 5 mg by mouth daily.    . feeding supplement, ENSURE ENLIVE, (ENSURE ENLIVE) LIQD Take 237 mLs by mouth 2 (two) times daily between meals.    . gabapentin (NEURONTIN) 300 MG capsule Take 300 mg by mouth 3 (three) times daily.    . isosorbide  mononitrate (IMDUR) 30 MG 24 hr tablet Take 1 tablet (30 mg total) by mouth daily. 90 tablet 3  . levothyroxine (SYNTHROID, LEVOTHROID) 50 MCG tablet Take 50 mcg by mouth daily.    . metoprolol tartrate (LOPRESSOR) 25 MG tablet Take 1 tablet (25 mg total) by mouth 3 (three) times daily. 270 tablet 3  . nitroGLYCERIN (NITROSTAT) 0.4 MG SL tablet Place 1 tablet (0.4 mg total) under the tongue every 5 (five) minutes as needed for chest pain. 25 tablet 3  . OXYGEN Inhale 3.5 L into the lungs daily as needed (for shortness of breath). 3-3.5lpm 24/7     . pantoprazole (PROTONIX) 40 MG tablet Take 1 tablet (40 mg total) by mouth at bedtime. (Patient taking differently: Take 40 mg by mouth daily. ) 30 tablet 0  . traMADol (ULTRAM) 50 MG tablet Take 2 tablets (100 mg total) by mouth every 8 (eight) hours as needed for severe pain. 20 tablet 0  . traZODone (DESYREL) 100 MG  tablet Take 1 tablet (100 mg total) by mouth at bedtime as needed for sleep. (Patient taking differently: Take 100 mg by mouth at bedtime. ) 30 tablet 0   No current facility-administered medications for this visit.    Physical Exam BP 136/80   Pulse 76   Temp 97.6 F (36.4 C) (Skin)   Resp 20   Ht 5\' 10"  (1.778 m)   Wt 165 lb (74.8 kg)   SpO2 95% Comment: RA  BMI 23.56 kg/m  69 year old man in no acute distress Flat affect Well-developed and well-nourished Lungs diminished, equal breath sounds bilaterally, no wheezing Cardiac regular rate and rhythm normal S1 and S2 No peripheral edema  Diagnostic Tests: CT ANGIOGRAPHY CHEST, ABDOMEN AND PELVIS  TECHNIQUE: Non-contrast CT of the chest was initially obtained.  Multidetector CT imaging through the chest, abdomen and pelvis was performed using the standard protocol during bolus administration of intravenous contrast. Multiplanar reconstructed images and MIPs were obtained and reviewed to evaluate the vascular anatomy.  CONTRAST:  86mL ISOVUE-370 IOPAMIDOL (ISOVUE-370) INJECTION 76%  COMPARISON:  CTA 03/23/2019 and CTA 11/03/2018  FINDINGS: CTA CHEST FINDINGS  Cardiovascular: Post CABG. LIMA graft is patent. Patent graft coming off the aorta and extending into the left circumflex coronary artery region. Ascending thoracic aorta measures 3.5 cm. Atherosclerotic disease at the aortic arch is similar to the previous examination. Great vessels are patent. Again noted is mild narrowing at the origin of the left common carotid artery. Proximal vertebral arteries are patent bilaterally. Bilateral subclavian arteries are patent.  Again noted is a thrombosed dissection involving the descending thoracic aorta originating distal to the left subclavian artery. Again noted is a large penetrating aortic ulcer in the region of the aortic isthmus and extending along the superior aspect of the aorta. This large penetrating  ulceration measures 2.3 x 1.6 cm on sequence 14, image 112 and previously measured 2.8 x 1.7 cm. Overall size of the aorta including the thrombosed false lumen measures 4.7 cm in the posterior arch region on sequence 14, image 108 and previously measured 4.7 cm. Proximal descending thoracic aorta on the axial images, sequence 9 image 45 measures 4.7 cm and stable. Mid descending thoracic aorta measures 3.5 cm on sequence 9, image 84 and stable. Distal descending thoracic aorta near the hiatus measures up to 4.4 cm and previously measured 4.3 cm. Dissection extends into the proximal abdominal aorta.  Mediastinum/Nodes: No lymph node enlargement in the mediastinum or hilum.  Lungs/Pleura: Again noted are  dependent densities in the trachea suggestive for mucus. Centrilobular emphysema. Punctate calcified granuloma in the right middle lobe on sequence 7, image 100. Punctate calcified granulomas in the right lower lobe on sequence 7, image 119 and 84. Punctate calcified granuloma in the left upper lobe on sequence 7, image 48. No significant airspace disease or consolidation in the lungs. Stable volume loss or scarring in the lingula along the left major fissure. No pleural effusions.  Musculoskeletal: Chronic deformity involving the right scapula may be posttraumatic. No acute bone abnormality.  Review of the MIP images confirms the above findings.  CTA ABDOMEN AND PELVIS FINDINGS  VASCULAR  Aorta: Aortic dissection with thrombosed false lumen extends into the proximal abdominal aorta and appears to terminate near the left renal artery. Findings are similar to the exam in 2020. Proximal abdominal at the level of celiac trunk measures 3.7 cm and stable. Again noted is an infrarenal abdominal aortic aneurysm containing mural thrombus. The right side of the abdominal aortic aneurysm wall is difficult to differentiate from the IVC due to the phase of imaging. Maximum dimension  of the abdominal aortic aneurysm is 4.7 cm and similar to the exam on 11/03/2018. Aneurysm terminates at the aortic bifurcation.  Celiac: Mild narrowing at the origin. No significant stenosis. Main branch vessels are patent. Incidentally, there is a replaced left hepatic artery originating from the left gastric artery and this is a normal variant.  SMA: Mild atherosclerotic disease without significant stenosis. No evident evidence for aneurysm or dissection involving the SMA.  Renals: Right renal artery is widely patent without aneurysm, dissection orsignificant stenosis. It appears that the aortic dissection involves the left renal artery with mild narrowing at the origin. Stable appearance of left renal artery.  IMA: Origin is occluded due to the aortic mural thrombus. Distal reconstitution of the IMA into the pelvis. Limited evaluation of the distal IMA branches.  Inflow: Stable ectasia of the left common iliac artery measuring 1.7 cm. Focal dilatation of the distal right common iliac artery measures up to 1.2 cm and stable. Mild narrowing of the proximal right common iliac artery. Common, internal and external iliac arteries are patent bilaterally. No significant stenosis in the external iliac arteries.  Proximal outflow: Proximal right femoral arteries are patent without significant stenosis. Proximal left femoral arteries are patent but there is close to 50% narrowing of the proximal left SFA.  Veins: No obvious venous abnormality within the limitations of this arterial phase study.  Review of the MIP images confirms the above findings.  NON-VASCULAR  Hepatobiliary: No acute abnormality to the liver or gallbladder.  Pancreas: Unremarkable. No pancreatic ductal dilatation or surrounding inflammatory changes.  Spleen: Normal in size without focal abnormality.  Adrenals/Urinary Tract: Normal appearance of the adrenal glands. Normal appearance of both  kidneys without hydronephrosis. No acute abnormality to the urinary bladder.  Stomach/Bowel: Stomach is within normal limits. Appendix appears normal. No evidence of bowel wall thickening, distention, or inflammatory changes.  Lymphatic: No abdominal or pelvic lymphadenopathy.  Reproductive: Prostate is unremarkable.  Other: Negative for ascites.  Negative for free air.  Musculoskeletal: Pedicle screw and rod fixation at L4, L5 and S1 with interbody devices. Disc space narrowing with endplate changes at E0-C1.  Review of the MIP images confirms the above findings.  IMPRESSION: 1. Chronic aortic dissection involving the descending thoracic aorta and proximal abdominal aorta. This is compatible with a Stanford type B aortic dissection. Stable size of the descending thoracic aorta. Aortic dissection is thrombosed  except for a penetrating aortic ulcer at the aortic isthmus. This ulceration may have slightly decreased in size from the exam on February 2021. 2. Stable abdominal aortic aneurysm measuring up to 4.7 cm. Recommend follow-up every 6 months and vascular consultation. This recommendation follows ACR consensus guidelines: White Paper of the ACR Incidental Findings Committee II on Vascular Findings. J Am Coll Radiol 2013; 10:789-794. 3. Aortic Atherosclerosis (ICD10-I70.0) and Emphysema (ICD10-J43.9). 4. No acute abnormality in the chest, abdomen or pelvis.   Electronically Signed   By: Markus Daft M.D.   On: 09/28/2019 11:25 I personally reviewed the CT images and concur with the findings noted above  Impression: [name] is a 69 year old man with a history of CAD, CABG in 2009, drug-eluting stent placement 2020, type III aortic dissection 2008, descending thoracic aneurysm, infrarenal abdominal aortic aneurysm, tobacco abuse (quit 2019), COPD, chronic back pain, anxiety, and depression.  Atherosclerotic cardiovascular disease, CAD, CABG, drug-eluting  stent-stable at present time with no anginal symptoms.  Does complain of easy bleeding and bruising with Plavix.  Follows with [name]  Type III dissection with residual penetrating ulcer-descending thoracic aorta stable at 4.7 cm.  Needs continued semiannual follow-up.  Abdominal aortic aneurysm-stable at 4.7 cm.  Needs continued follow-up.  Followed by Dr. Donnetta Hutching.  Hypertension-blood pressure control acceptable.  Tobacco abuse-quit in 2019.  Understands importance of continued abstinence.  Plan: Return in 6 months with CT angio of chest abdomen and pelvis. Follow-up as scheduled with Dr. Charlestine Massed and [name].  Continue to follow-up with Dr. Deforest Hoyles.  I spent over 20 minutes today in review of records, images, and in consultation with [name]. Melrose Nakayama, MD [facility] [phone]

## 2019-10-19 DIAGNOSIS — E7800 Pure hypercholesterolemia, unspecified: Secondary | ICD-10-CM | POA: Diagnosis not present

## 2019-10-19 DIAGNOSIS — I2510 Atherosclerotic heart disease of native coronary artery without angina pectoris: Secondary | ICD-10-CM | POA: Diagnosis not present

## 2019-10-19 DIAGNOSIS — I10 Essential (primary) hypertension: Secondary | ICD-10-CM | POA: Diagnosis not present

## 2019-10-19 DIAGNOSIS — J449 Chronic obstructive pulmonary disease, unspecified: Secondary | ICD-10-CM | POA: Diagnosis not present

## 2019-10-19 DIAGNOSIS — I25810 Atherosclerosis of coronary artery bypass graft(s) without angina pectoris: Secondary | ICD-10-CM | POA: Diagnosis not present

## 2019-10-19 DIAGNOSIS — I214 Non-ST elevation (NSTEMI) myocardial infarction: Secondary | ICD-10-CM | POA: Diagnosis not present

## 2019-10-19 DIAGNOSIS — E039 Hypothyroidism, unspecified: Secondary | ICD-10-CM | POA: Diagnosis not present

## 2019-10-19 DIAGNOSIS — F331 Major depressive disorder, recurrent, moderate: Secondary | ICD-10-CM | POA: Diagnosis not present

## 2019-10-19 DIAGNOSIS — D649 Anemia, unspecified: Secondary | ICD-10-CM | POA: Diagnosis not present

## 2019-11-05 DIAGNOSIS — E7800 Pure hypercholesterolemia, unspecified: Secondary | ICD-10-CM | POA: Diagnosis not present

## 2019-11-05 DIAGNOSIS — I10 Essential (primary) hypertension: Secondary | ICD-10-CM | POA: Diagnosis not present

## 2019-11-05 DIAGNOSIS — I25810 Atherosclerosis of coronary artery bypass graft(s) without angina pectoris: Secondary | ICD-10-CM | POA: Diagnosis not present

## 2019-11-05 DIAGNOSIS — I214 Non-ST elevation (NSTEMI) myocardial infarction: Secondary | ICD-10-CM | POA: Diagnosis not present

## 2019-11-05 DIAGNOSIS — E039 Hypothyroidism, unspecified: Secondary | ICD-10-CM | POA: Diagnosis not present

## 2019-11-05 DIAGNOSIS — D649 Anemia, unspecified: Secondary | ICD-10-CM | POA: Diagnosis not present

## 2019-11-05 DIAGNOSIS — J441 Chronic obstructive pulmonary disease with (acute) exacerbation: Secondary | ICD-10-CM | POA: Diagnosis not present

## 2019-11-05 DIAGNOSIS — F329 Major depressive disorder, single episode, unspecified: Secondary | ICD-10-CM | POA: Diagnosis not present

## 2019-11-05 DIAGNOSIS — F322 Major depressive disorder, single episode, severe without psychotic features: Secondary | ICD-10-CM | POA: Diagnosis not present

## 2019-11-25 ENCOUNTER — Other Ambulatory Visit: Payer: Self-pay

## 2019-11-25 NOTE — Patient Outreach (Signed)
[facility]) Care Management  11/25/2019  [name] February 24, 1950 [id number]    Telephone Assessment    Unsuccessful quarterly outreach attempt to patient.       Plan: RN CM will make quarterly outreach attempt to patient within the month of Feb  if no return call from patient.   [name], RN,BSN,CCM [place] Management Telephonic Care Management Coordinator Direct Phone: [phone] Toll Free: [phone] Fax: [phone]

## 2019-11-26 ENCOUNTER — Other Ambulatory Visit: Payer: Self-pay | Admitting: Interventional Cardiology

## 2019-12-13 DIAGNOSIS — I25810 Atherosclerosis of coronary artery bypass graft(s) without angina pectoris: Secondary | ICD-10-CM | POA: Diagnosis not present

## 2019-12-13 DIAGNOSIS — I2510 Atherosclerotic heart disease of native coronary artery without angina pectoris: Secondary | ICD-10-CM | POA: Diagnosis not present

## 2019-12-13 DIAGNOSIS — E7800 Pure hypercholesterolemia, unspecified: Secondary | ICD-10-CM | POA: Diagnosis not present

## 2019-12-13 DIAGNOSIS — I214 Non-ST elevation (NSTEMI) myocardial infarction: Secondary | ICD-10-CM | POA: Diagnosis not present

## 2019-12-13 DIAGNOSIS — I10 Essential (primary) hypertension: Secondary | ICD-10-CM | POA: Diagnosis not present

## 2019-12-13 DIAGNOSIS — F329 Major depressive disorder, single episode, unspecified: Secondary | ICD-10-CM | POA: Diagnosis not present

## 2019-12-13 DIAGNOSIS — D649 Anemia, unspecified: Secondary | ICD-10-CM | POA: Diagnosis not present

## 2019-12-13 DIAGNOSIS — J449 Chronic obstructive pulmonary disease, unspecified: Secondary | ICD-10-CM | POA: Diagnosis not present

## 2019-12-13 DIAGNOSIS — E039 Hypothyroidism, unspecified: Secondary | ICD-10-CM | POA: Diagnosis not present

## 2020-01-06 DIAGNOSIS — I10 Essential (primary) hypertension: Secondary | ICD-10-CM | POA: Diagnosis not present

## 2020-01-06 DIAGNOSIS — J449 Chronic obstructive pulmonary disease, unspecified: Secondary | ICD-10-CM | POA: Diagnosis not present

## 2020-01-06 DIAGNOSIS — D649 Anemia, unspecified: Secondary | ICD-10-CM | POA: Diagnosis not present

## 2020-01-06 DIAGNOSIS — G8929 Other chronic pain: Secondary | ICD-10-CM | POA: Diagnosis not present

## 2020-01-06 DIAGNOSIS — F329 Major depressive disorder, single episode, unspecified: Secondary | ICD-10-CM | POA: Diagnosis not present

## 2020-01-06 DIAGNOSIS — E7800 Pure hypercholesterolemia, unspecified: Secondary | ICD-10-CM | POA: Diagnosis not present

## 2020-01-06 DIAGNOSIS — E039 Hypothyroidism, unspecified: Secondary | ICD-10-CM | POA: Diagnosis not present

## 2020-01-06 DIAGNOSIS — I25810 Atherosclerosis of coronary artery bypass graft(s) without angina pectoris: Secondary | ICD-10-CM | POA: Diagnosis not present

## 2020-01-06 DIAGNOSIS — I214 Non-ST elevation (NSTEMI) myocardial infarction: Secondary | ICD-10-CM | POA: Diagnosis not present

## 2020-01-20 DIAGNOSIS — J441 Chronic obstructive pulmonary disease with (acute) exacerbation: Secondary | ICD-10-CM | POA: Diagnosis not present

## 2020-01-20 DIAGNOSIS — I2510 Atherosclerotic heart disease of native coronary artery without angina pectoris: Secondary | ICD-10-CM | POA: Diagnosis not present

## 2020-01-20 DIAGNOSIS — I10 Essential (primary) hypertension: Secondary | ICD-10-CM | POA: Diagnosis not present

## 2020-01-20 DIAGNOSIS — E039 Hypothyroidism, unspecified: Secondary | ICD-10-CM | POA: Diagnosis not present

## 2020-01-20 DIAGNOSIS — F411 Generalized anxiety disorder: Secondary | ICD-10-CM | POA: Diagnosis not present

## 2020-01-20 DIAGNOSIS — J9611 Chronic respiratory failure with hypoxia: Secondary | ICD-10-CM | POA: Diagnosis not present

## 2020-01-20 DIAGNOSIS — F331 Major depressive disorder, recurrent, moderate: Secondary | ICD-10-CM | POA: Diagnosis not present

## 2020-01-20 DIAGNOSIS — I739 Peripheral vascular disease, unspecified: Secondary | ICD-10-CM | POA: Diagnosis not present

## 2020-01-20 DIAGNOSIS — J449 Chronic obstructive pulmonary disease, unspecified: Secondary | ICD-10-CM | POA: Diagnosis not present

## 2020-01-23 IMAGING — CT CT ANGIO CHEST
2 of 6 series · 18 of 46 positions shown · IV contrast (Isovue)
Comparison: Formal chest obtained earlier today and chest CTA dated
04/03/2015.

CLINICAL DATA: Increased shortness of breath since discharge from
the hospital on 01/04/2018.

EXAM:
CT ANGIOGRAPHY CHEST WITH CONTRAST
TECHNIQUE: Multidetector CT imaging of the chest was performed using the
standard protocol during bolus administration of intravenous
contrast. Multiplanar CT image reconstructions and MIPs were
obtained to evaluate the vascular anatomy.
CONTRAST:  100mL HWUZR2-GUA IOPAMIDOL (HWUZR2-GUA) INJECTION 76%

[Series 5: thins · axial · 0.74mm/px · z∈[-97,+191]mm · 15 of 316 slices shown]
[im 14/316  lung]
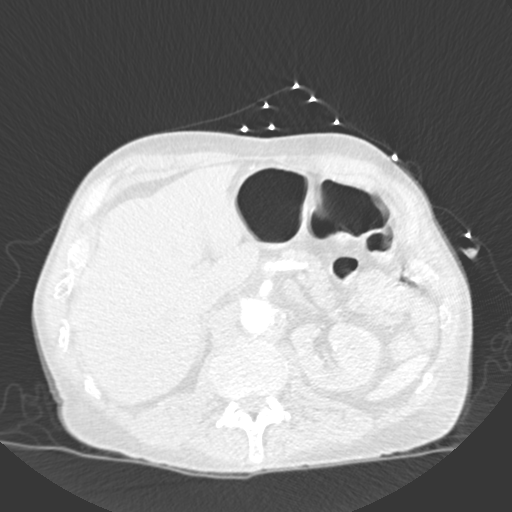
[im 42/316  soft-tissue]
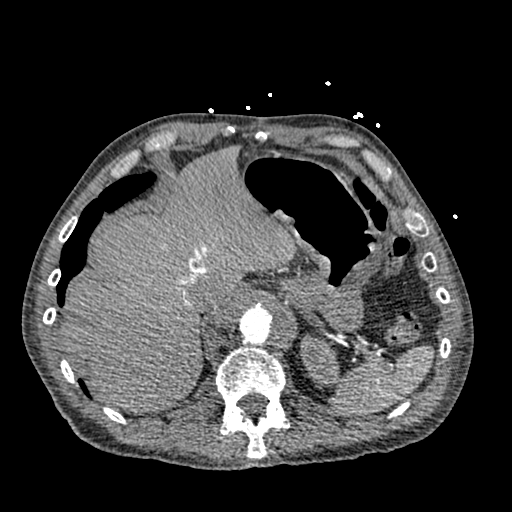
[im 55/316  lung]
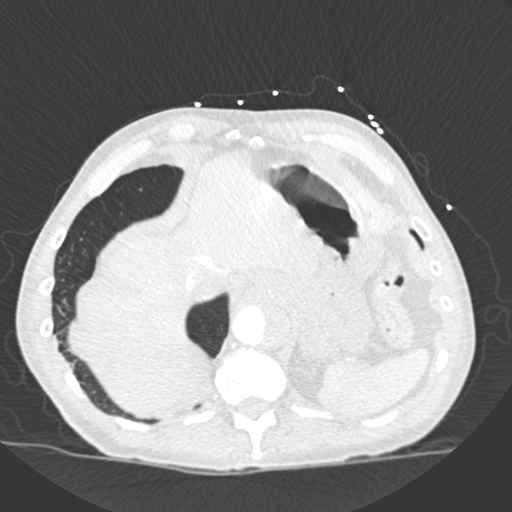
[im 83/316  soft-tissue]
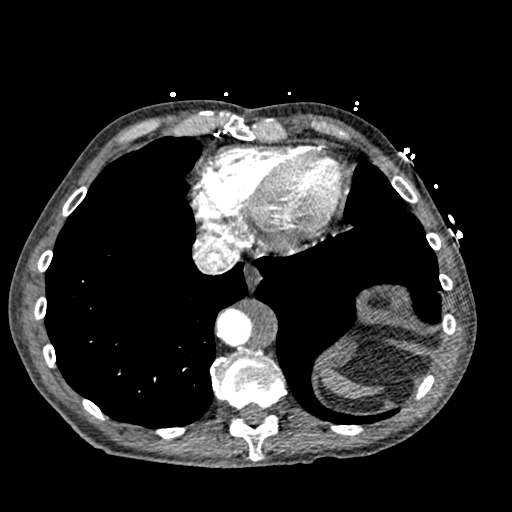
[im 96/316  lung]
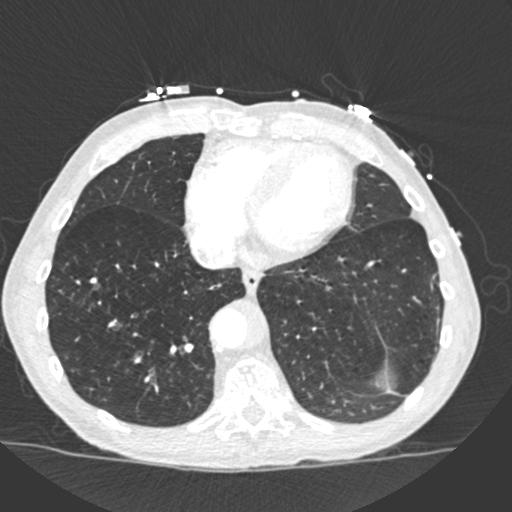
[im 124/316  soft-tissue]
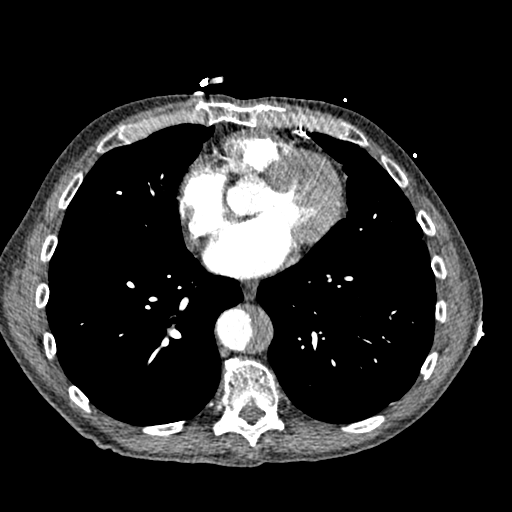
[im 137/316  lung]
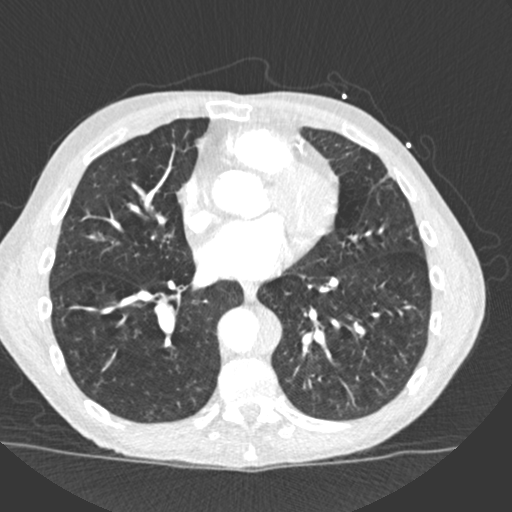
[im 165/316  soft-tissue]
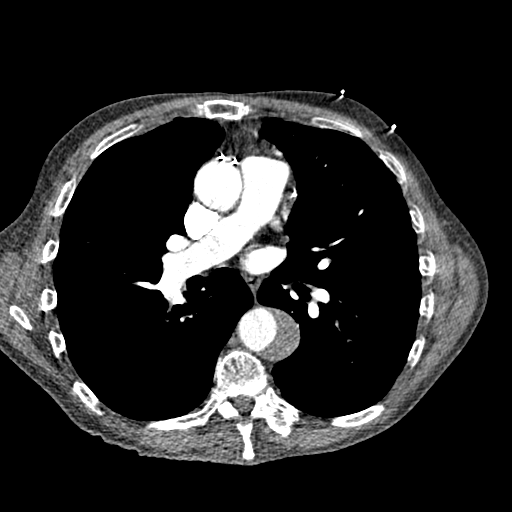
[im 179/316  lung]
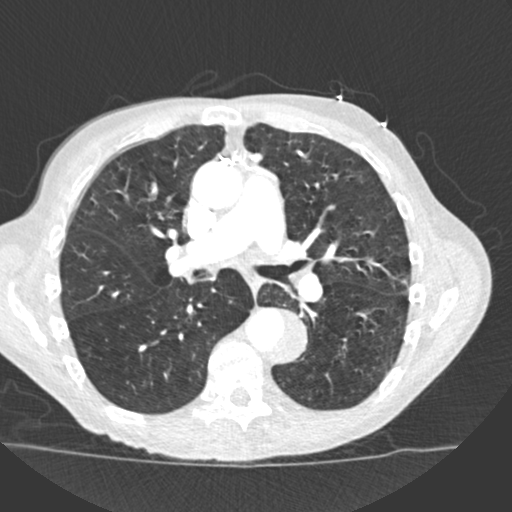
[im 192/316  soft-tissue]
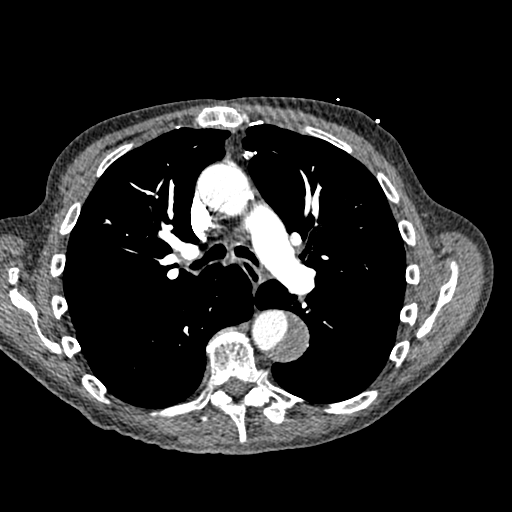
[im 220/316  lung]
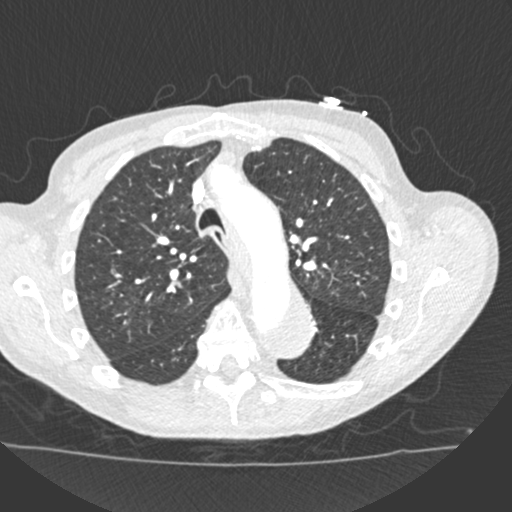
[im 233/316  soft-tissue]
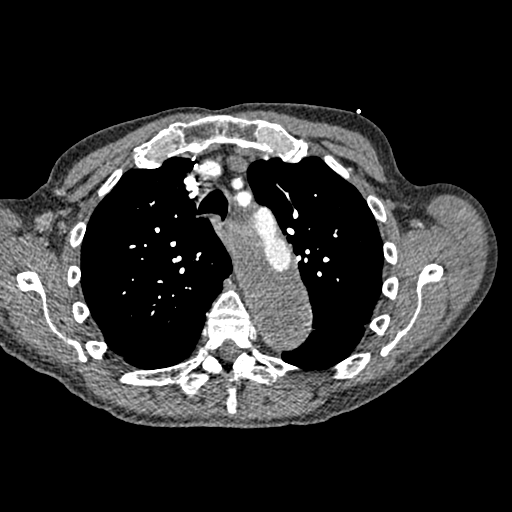
[im 261/316  lung]
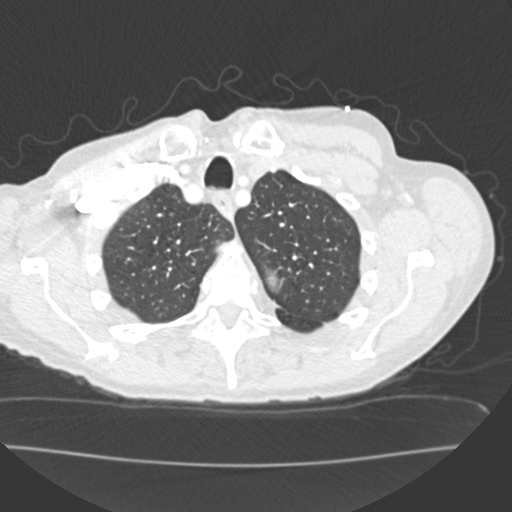
[im 274/316  soft-tissue]
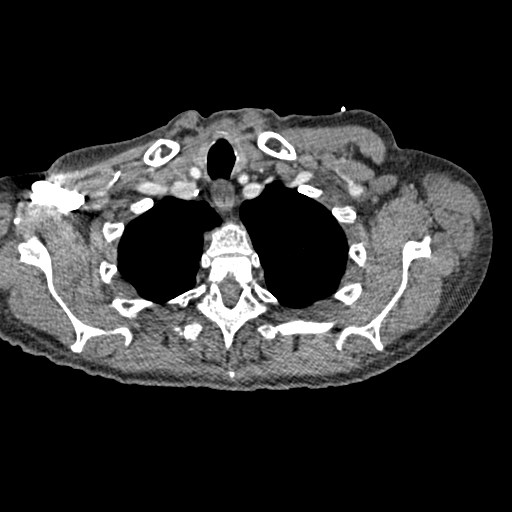
[im 302/316  lung]
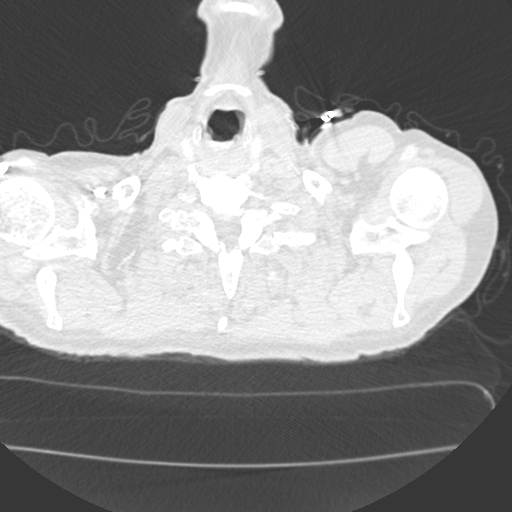

[Series 7: coronal mpr · coronal · 0.61mm/px · 3 of 151 slices shown]
[im 38/151  soft-tissue]
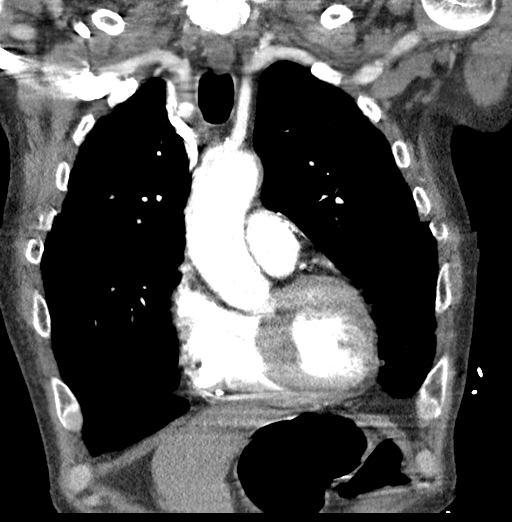
[im 76/151  soft-tissue]
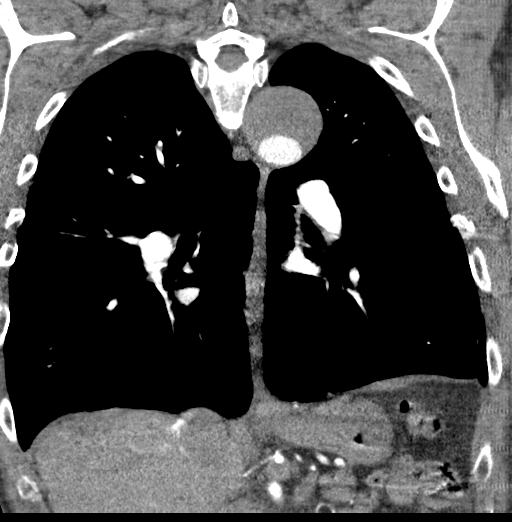
[im 113/151  soft-tissue]
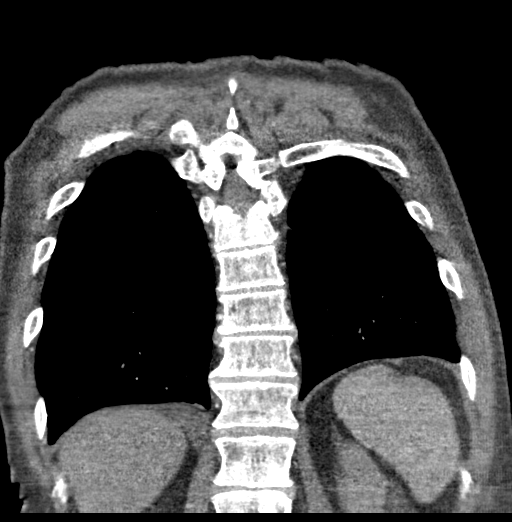

[18 of 46 positions shown; findings below may reference images not displayed]

FINDINGS: Cardiovascular: Previously demonstrated [HOSPITAL] type B dissection
of the aorta beginning just beyond the origin of the left subclavian
artery and extending into the upper abdomen to the level of the
origin of the renal arteries with extension into the proximal left
renal artery, causing approximately 90+% stenosis of the proximal
left renal artery. There has been interval thrombosis of the false
lumen of the dissection with the exception of a small amount of
persistent opacified blood in the aortic arch superiorly on the
left. No significant narrowing of the true lumen of the aorta is
seen.

Stable post CABG changes. Atheromatous calcifications, including the
coronary arteries and aorta.

Mediastinum/Nodes: No enlarged mediastinal, hilar, or axillary lymph
nodes. Thyroid gland, trachea, and esophagus demonstrate no
significant findings.

Lungs/Pleura: The lungs remain mildly hyperexpanded with mild
bullous changes. Minimal residual atelectasis or scarring at the
right lung base posteriorly. No pleural fluid.

Upper Abdomen: Aortic dissection extending into the proximal left
renal artery, as described above.

Musculoskeletal: Motion artifacts making it difficult to assess for
rib and sternal fractures. Thoracic spine degenerative changes and
mild scoliosis. Lower cervical spine degenerative changes.

Review of the MIP images confirms the above findings.
IMPRESSION: 1. Previously demonstrated [HOSPITAL] type B dissection of the aorta
beginning just beyond the origin of the left subclavian artery and
extending into the upper abdomen and into the proximal left renal
artery, causing approximately 90+% stenosis of the proximal left
renal artery.
2. Interval thrombosis of the false lumen of the dissection with the
exception of a small amount of persistent opacified blood in the
aortic arch superiorly on the left.
3. Mild changes of COPD.
4. Calcific coronary artery and aortic atherosclerosis and post CABG
changes.

Aortic Atherosclerosis (KSZL3-BY3.3) and Emphysema (KSZL3-W2Z.4).

## 2020-01-25 ENCOUNTER — Other Ambulatory Visit: Payer: Self-pay

## 2020-01-25 NOTE — Patient Outreach (Signed)
[facility]) Care Management  01/25/2020  [name] 10-Oct-1950 [id number]   Telephone Assessment   Unsuccessful quarterly outreach attempt to patient.     Plan: RN CM will make quarterly outreach attempt to patient within the month of April if no return call from patient.:  Antionette Fairy, RN,BSN,CCM [facility] Care Management Telephonic Care Management Coordinator Direct Phone: [phone] Toll Free: [phone] Fax: [phone]

## 2020-01-26 ENCOUNTER — Telehealth: Payer: Self-pay

## 2020-01-26 NOTE — Telephone Encounter (Signed)
NOTES ON FILE FROM [facility] [phone], SENT REFERRAL TO SCHEDULING 

## 2020-01-28 ENCOUNTER — Ambulatory Visit: Payer: Self-pay

## 2020-02-10 DIAGNOSIS — G8929 Other chronic pain: Secondary | ICD-10-CM | POA: Diagnosis not present

## 2020-02-10 DIAGNOSIS — I214 Non-ST elevation (NSTEMI) myocardial infarction: Secondary | ICD-10-CM | POA: Diagnosis not present

## 2020-02-10 DIAGNOSIS — F329 Major depressive disorder, single episode, unspecified: Secondary | ICD-10-CM | POA: Diagnosis not present

## 2020-02-10 DIAGNOSIS — I10 Essential (primary) hypertension: Secondary | ICD-10-CM | POA: Diagnosis not present

## 2020-02-10 DIAGNOSIS — D649 Anemia, unspecified: Secondary | ICD-10-CM | POA: Diagnosis not present

## 2020-02-10 DIAGNOSIS — J449 Chronic obstructive pulmonary disease, unspecified: Secondary | ICD-10-CM | POA: Diagnosis not present

## 2020-02-10 DIAGNOSIS — E039 Hypothyroidism, unspecified: Secondary | ICD-10-CM | POA: Diagnosis not present

## 2020-02-10 DIAGNOSIS — E7800 Pure hypercholesterolemia, unspecified: Secondary | ICD-10-CM | POA: Diagnosis not present

## 2020-02-10 DIAGNOSIS — I25810 Atherosclerosis of coronary artery bypass graft(s) without angina pectoris: Secondary | ICD-10-CM | POA: Diagnosis not present

## 2020-02-22 ENCOUNTER — Ambulatory Visit: Payer: Medicare HMO | Admitting: Cardiology

## 2020-02-22 ENCOUNTER — Other Ambulatory Visit: Payer: Self-pay

## 2020-02-22 ENCOUNTER — Encounter: Payer: Self-pay | Admitting: Cardiology

## 2020-02-22 VITALS — BP 110/70 | HR 82 | Ht 70.0 in | Wt 167.0 lb

## 2020-02-22 DIAGNOSIS — I2510 Atherosclerotic heart disease of native coronary artery without angina pectoris: Secondary | ICD-10-CM | POA: Diagnosis not present

## 2020-02-22 DIAGNOSIS — J449 Chronic obstructive pulmonary disease, unspecified: Secondary | ICD-10-CM

## 2020-02-22 NOTE — Progress Notes (Signed)
Cardiology Office Note:    Date:  02/22/2020   ID:  [name], DOB May 01, 1950, MRN [id number]  PCP:  Wenda Low, MD  Cardiologist:  Candee Furbish, MD  Electrophysiologist:  None   Referring MD: Wenda Low, MD     History of Present Illness:    [name] is a 71 y.o. male with severe underlying coronary artery disease post CABG in 2009 with continued ongoing chest pain here for follow-up. Overall has been fairly stable from a CAD perspective. He has experienced worsening shortness of breath with regards to his COPD.  Cardiac catheterization was performed on 09/21/2018 last:  Diagnostic Dominance: Right  Intervention    Multiple stents were placed in the RCA distribution at that time.  Unfortunately, he continues to have severe ongoing chest discomfort.  He is on optimal medical therapy as well.  Cardiac catheterization 05/14/2019:  Patent stents.  No change from 2020 cath.  Consider small vessel disease as etiology.  Circumflex territory has limited blood flow, but it is relatively small compared to the other distributions.  No clear target for PCI.     Shortly after stent placement and discharge he felt fatigue shortness of breath.  He was denying any chest pain at that time however.  Orthostatic symptoms go over and getting back up he feels as though he is going to pass out .  Denies any bleeding syncope no loss of consciousness no orthopnea no PND.  Past Medical History:  Diagnosis Date  . Anemia   . Anxiety   . Aortic dissection (HCC)    TYPE 3  . CAD (coronary artery disease)    multiple, most recently Jan-March 2017  . Cardiac tamponade   . Chronic bronchitis ([place])   . Chronic fatigue   . Chronic pain   . Constipation   . COPD (chronic obstructive pulmonary disease) ([place])   . Depression   . Dysphagia, oropharyngeal phase   . ETOH abuse   . GERD (gastroesophageal reflux disease)   . Hiatal hernia   . HTN (hypertension)   . Hypercholesterolemia   .  Metabolic encephalopathy   . Opioid abuse ([place])   . Peptic ulcer disease 08/2010   EGD   . PNA (pneumonia)   . Schatzki's ring     Past Surgical History:  Procedure Laterality Date  . BACK SURGERY    . CABG X 6  07/31/2007   [facility]  . CARDIAC CATHETERIZATION    . CARDIAC CATHETERIZATION N/A 03/16/2015   Procedure: Coronary/Graft Angiography;  Surgeon: Sherren Mocha, MD;  Location: [facility];  Service: Cardiovascular;  Laterality: N/A;  . CARDIAC CATHETERIZATION N/A 03/16/2015   Procedure: Coronary Stent Intervention;  Surgeon: Sherren Mocha, MD;  Location: [facility];  Service: Cardiovascular;  Laterality: N/A;  . CORONARY STENT INTERVENTION N/A 09/21/2018   Procedure: CORONARY STENT INTERVENTION;  Surgeon: Jettie Booze, MD;  Location: [facility];  Service: Cardiovascular;  Laterality: N/A;  . KNEE SURGERY Right    acl  . LEFT HEART CATH AND CORS/GRAFTS ANGIOGRAPHY N/A 09/21/2018   Procedure: LEFT HEART CATH AND CORS/GRAFTS ANGIOGRAPHY;  Surgeon: Jettie Booze, MD;  Location: [facility];  Service: Cardiovascular;  Laterality: N/A;  . LEFT HEART CATH AND CORS/GRAFTS ANGIOGRAPHY N/A 05/14/2019   Procedure: LEFT HEART CATH AND CORS/GRAFTS ANGIOGRAPHY;  Surgeon: Jettie Booze, MD;  Location: [facility];  Service: Cardiovascular;  Laterality: N/A;  . OLECRANON BURSECTOMY Left  08/27/2012   Procedure: EXCISION LEFT OLECRANON BURSA;  Surgeon: Darreld McleanWayne Keeling, MD;  Location: [facility];  Service: Orthopedics;  Laterality: Left;  . OLECRANON BURSECTOMY Left 05/25/2013   Procedure:  Excision sinus tract and tissue left elbow ;  Surgeon: Darreld McleanWayne Keeling, MD;  Location: [facility];  Service: Orthopedics;  Laterality: Left;  . ORIF SCAPULAR FRACTURE Right   . SHOULDER SURGERY Left   . STERNAL WIRE REMOVEAL  12/31/2007   [facility]    Current Medications: Current Meds  Medication Sig  . acetaminophen (TYLENOL) 500 MG tablet Take 500 mg by mouth  every 6 (six) hours as needed for mild pain or headache.  . albuterol (PROVENTIL) (2.5 MG/3ML) 0.083% nebulizer solution Take 3 mLs (2.5 mg total) by nebulization every 6 (six) hours as needed for wheezing or shortness of breath.  Marland Kitchen. albuterol (VENTOLIN HFA) 108 (90 Base) MCG/ACT inhaler Inhale 2 puffs into the lungs every 6 (six) hours as needed for wheezing or shortness of breath.  . ALPRAZolam (XANAX) 0.5 MG tablet Take 0.5 mg by mouth 2 (two) times daily as needed for anxiety.   Marland Kitchen. aspirin EC 81 MG tablet Take 1 tablet (81 mg total) by mouth daily.  Marland Kitchen. atorvastatin (LIPITOR) 40 MG tablet Take 1 tablet (40 mg total) by mouth daily.  . Budeson-Glycopyrrol-Formoterol (BREZTRI AEROSPHERE) 160-9-4.8 MCG/ACT AERO Inhale 2 puffs into the lungs 2 (two) times daily.  . busPIRone (BUSPAR) 7.5 MG tablet Take 1 tablet (7.5 mg total) by mouth 3 (three) times daily.  . clopidogrel (PLAVIX) 75 MG tablet TAKE ONE TABLET BY MOUTH DAILY  . escitalopram (LEXAPRO) 5 MG tablet Take 5 mg by mouth daily.  . feeding supplement, ENSURE ENLIVE, (ENSURE ENLIVE) LIQD Take 237 mLs by mouth 2 (two) times daily between meals.  . gabapentin (NEURONTIN) 300 MG capsule Take 300 mg by mouth 3 (three) times daily.  . isosorbide mononitrate (IMDUR) 30 MG 24 hr tablet Take 1 tablet (30 mg total) by mouth daily.  Marland Kitchen. levothyroxine (SYNTHROID, LEVOTHROID) 50 MCG tablet Take 50 mcg by mouth daily.  . metoprolol tartrate (LOPRESSOR) 25 MG tablet Take 1 tablet (25 mg total) by mouth 3 (three) times daily.  . nitroGLYCERIN (NITROSTAT) 0.4 MG SL tablet Place 1 tablet (0.4 mg total) under the tongue every 5 (five) minutes as needed for chest pain.  . OXYGEN Inhale 3.5 L into the lungs daily as needed (for shortness of breath). 3-3.5lpm 24/7  . pantoprazole (PROTONIX) 40 MG tablet Take 1 tablet (40 mg total) by mouth at bedtime.  . traMADol (ULTRAM) 50 MG tablet Take 2 tablets (100 mg total) by mouth every 8 (eight) hours as needed for severe  pain.  . traZODone (DESYREL) 100 MG tablet Take 1 tablet (100 mg total) by mouth at bedtime as needed for sleep.  . [DISCONTINUED] amLODipine (NORVASC) 5 MG tablet Take 5 mg by mouth at bedtime.      Allergies:   Nsaids, Bee venom, and Codeine   Social History   Socioeconomic History  . Marital status: Married    Spouse name: Not on file  . Number of children: Not on file  . Years of education: Not on file  . Highest education level: Not on file  Occupational History  . Occupation: CuratorMECHANIC  Tobacco Use  . Smoking status: Former Smoker    Packs/day: 1.00    Years: 20.00    Pack years: 20.00    Types: Cigarettes    Quit date: 12/28/2017  Years since quitting: 2.1  . Smokeless tobacco: Former Systems developer    Quit date: 06/05/2015  . Tobacco comment: 04/30/16- smoking 1 cig per day//lmr  Vaping Use  . Vaping Use: Never used  Substance and Sexual Activity  . Alcohol use: No    Alcohol/week: 0.0 standard drinks    Comment: denies  . Drug use: No  . Sexual activity: Not on file  Other Topics Concern  . Not on file  Social History Narrative          Social Determinants of Health   Financial Resource Strain: Not on file  Food Insecurity: Not on file  Transportation Needs: Not on file  Physical Activity: Not on file  Stress: Not on file  Social Connections: Not on file     Family History: The patient's family history includes Asthma in his sister; Emphysema in his paternal grandfather and paternal grandmother; Heart disease in his father; Stomach cancer in his paternal grandfather.  ROS:   Please see the history of present illness.     All other systems reviewed and are negative.  EKGs/Labs/Other Studies Reviewed:    The following studies were reviewed today: See cath description above  ECHO 2019  - Left ventricle: The cavity size was normal. Wall thickness was  increased in a pattern of moderate LVH. Systolic function was  normal. The estimated ejection fraction  was in the range of 55%  to 60%.  - Aortic valve: Mildly calcified annulus. Mildly thickened  leaflets. Valve area (VTI): 2.26 cm^2. Valve area (Vmax): 2.26  cm^2.  - Mitral valve: Mildly calcified annulus. Mildly thickened leaflets  .  - Technically difficult study.   EKG: Prior EKG shows sinus rhythm 76 T wave inversions noted in 2 3 aVF, fairly small amplitude.  These T wave inversions are change in prior.  He also has subtle T wave change in V6.  Recent Labs: 05/07/2019: BUN 10; Creatinine, Ser 0.98; Hemoglobin 14.0; Platelets 156; Potassium 3.9; Sodium 142  Recent Lipid Panel    Component Value Date/Time   CHOL 204 (H) 03/15/2015 0558   TRIG 116 04/09/2015 0802   HDL 29 (L) 03/15/2015 0558   CHOLHDL 7.0 03/15/2015 0558   VLDL 26 03/15/2015 0558   LDLCALC 149 (H) 03/15/2015 0558    Physical Exam:    VS:  BP 110/70 (BP Location: Left Arm, Patient Position: Sitting, Cuff Size: Normal)   Pulse 82   Ht 5\' 10"  (1.778 m)   Wt 167 lb (75.8 kg)   SpO2 93%   BMI 23.96 kg/m     Wt Readings from Last 3 Encounters:  02/22/20 167 lb (75.8 kg)  09/28/19 165 lb (74.8 kg)  05/14/19 170 lb (77.1 kg)     GEN: Well nourished, well developed, in no acute distress  HEENT: normal  Neck: no JVD, carotid bruits, or masses Cardiac: RRR; no murmurs, rubs, or gallops,no edema  Respiratory:  clear to auscultation bilaterally, normal work of breathing GI: soft, nontender, nondistended, + BS MS: no deformity or atrophy  Skin: warm and dry, no rash Neuro:  Alert and Oriented x 3, Strength and sensation are intact Psych: euthymic mood, full affect   ASSESSMENT:    1. Arteriosclerosis of coronary artery   2. Chronic obstructive pulmonary disease, unspecified COPD type ([place])    PLAN:    In order of problems listed above:  Severe angina with underlying severe coronary artery disease status post CABG and multiple PCI's last of which August  2020 to the RCA distribution.  Repeat  cardiac catheterization on 05/14/2019 was stable. -Stable.  Continuing with aggressive medical management.   Type B aortic dissection -First diagnosed in 2008, descending thoracic distribution, abdominal aortic aneurysm of 4.9 cm followed by Dr. Roxan Hockey as well.  No major changes at this point.  Continuing to monitor.  Hyperlipidemia -Continue with high intensity statin atorvastatin 40 mg.  Essential hypertension-labile -Given his orthostatic type symptoms, we will go ahead and discontinue his amlodipine 5 mg.  Hopefully this will help.  Willing to tolerate a slightly elevated blood pressure.  Tob use  - quit 12/2018  COPD -We will try to get him back in with Dr. Melvyn Novas as well.   Medication Adjustments/Labs and Tests Ordered: Current medicines are reviewed at length with the patient today.  Concerns regarding medicines are outlined above.  Orders Placed This Encounter  Procedures  . Ambulatory referral to Pulmonology   No orders of the defined types were placed in this encounter.   Patient Instructions  Medication Instructions:  Please discontinue your Amlodipine. Continue all other medications as listed.  *If you need a refill on your cardiac medications before your next appointment, please call your pharmacy*  You have been referred to Pulmonary for evaluation and treatment of COPD.  Follow-Up: At [facility], you and your health needs are our priority.  As part of our continuing mission to provide you with exceptional heart care, we have created designated Provider Care Teams.  These Care Teams include your primary Cardiologist (physician) and Advanced Practice Providers (APPs -  Physician Assistants and Nurse Practitioners) who all work together to provide you with the care you need, when you need it.  We recommend signing up for the patient portal called "MyChart".  Sign up information is provided on this After Visit Summary.  MyChart is used to connect with  patients for Virtual Visits (Telemedicine).  Patients are able to view lab/test results, encounter notes, upcoming appointments, etc.  Non-urgent messages can be sent to your provider as well.   To learn more about what you can do with MyChart, go to [email or web address].    Your next appointment:   6 month(s)  The format for your next appointment:   In Person  Provider:   Candee Furbish, MD   Thank you for choosing [facility]!!        Signed, Candee Furbish, MD  02/22/2020 11:43 AM    [place]

## 2020-02-22 NOTE — Patient Instructions (Signed)
Medication Instructions:  Please discontinue your Amlodipine. Continue all other medications as listed.  *If you need a refill on your cardiac medications before your next appointment, please call your pharmacy*  You have been referred to Pulmonary for evaluation and treatment of COPD.  Follow-Up: At [facility], you and your health needs are our priority.  As part of our continuing mission to provide you with exceptional heart care, we have created designated Provider Care Teams.  These Care Teams include your primary Cardiologist (physician) and Advanced Practice Providers (APPs -  Physician Assistants and Nurse Practitioners) who all work together to provide you with the care you need, when you need it.  We recommend signing up for the patient portal called "MyChart".  Sign up information is provided on this After Visit Summary.  MyChart is used to connect with patients for Virtual Visits (Telemedicine).  Patients are able to view lab/test results, encounter notes, upcoming appointments, etc.  Non-urgent messages can be sent to your provider as well.   To learn more about what you can do with MyChart, go to [email or web address].    Your next appointment:   6 month(s)  The format for your next appointment:   In Person  Provider:   Candee Furbish, MD   Thank you for choosing [facility]!!

## 2020-02-29 ENCOUNTER — Other Ambulatory Visit: Payer: Self-pay | Admitting: Thoracic Surgery (Cardiothoracic Vascular Surgery)

## 2020-02-29 DIAGNOSIS — I714 Abdominal aortic aneurysm, without rupture, unspecified: Secondary | ICD-10-CM

## 2020-02-29 DIAGNOSIS — I712 Thoracic aortic aneurysm, without rupture, unspecified: Secondary | ICD-10-CM

## 2020-02-29 DIAGNOSIS — I7102 Dissection of abdominal aorta: Secondary | ICD-10-CM

## 2020-03-15 DIAGNOSIS — I10 Essential (primary) hypertension: Secondary | ICD-10-CM | POA: Diagnosis not present

## 2020-03-15 DIAGNOSIS — J449 Chronic obstructive pulmonary disease, unspecified: Secondary | ICD-10-CM | POA: Diagnosis not present

## 2020-03-15 DIAGNOSIS — E7800 Pure hypercholesterolemia, unspecified: Secondary | ICD-10-CM | POA: Diagnosis not present

## 2020-03-15 DIAGNOSIS — I2510 Atherosclerotic heart disease of native coronary artery without angina pectoris: Secondary | ICD-10-CM | POA: Diagnosis not present

## 2020-03-15 DIAGNOSIS — G8929 Other chronic pain: Secondary | ICD-10-CM | POA: Diagnosis not present

## 2020-03-15 DIAGNOSIS — E039 Hypothyroidism, unspecified: Secondary | ICD-10-CM | POA: Diagnosis not present

## 2020-03-15 DIAGNOSIS — J441 Chronic obstructive pulmonary disease with (acute) exacerbation: Secondary | ICD-10-CM | POA: Diagnosis not present

## 2020-03-15 DIAGNOSIS — D649 Anemia, unspecified: Secondary | ICD-10-CM | POA: Diagnosis not present

## 2020-03-15 DIAGNOSIS — F329 Major depressive disorder, single episode, unspecified: Secondary | ICD-10-CM | POA: Diagnosis not present

## 2020-04-03 ENCOUNTER — Ambulatory Visit: Payer: Medicare HMO | Admitting: Internal Medicine

## 2020-04-03 ENCOUNTER — Encounter: Payer: Self-pay | Admitting: Internal Medicine

## 2020-04-03 ENCOUNTER — Other Ambulatory Visit: Payer: Self-pay

## 2020-04-03 DIAGNOSIS — E7800 Pure hypercholesterolemia, unspecified: Secondary | ICD-10-CM | POA: Diagnosis not present

## 2020-04-03 DIAGNOSIS — G8929 Other chronic pain: Secondary | ICD-10-CM | POA: Diagnosis not present

## 2020-04-03 DIAGNOSIS — J449 Chronic obstructive pulmonary disease, unspecified: Secondary | ICD-10-CM

## 2020-04-03 DIAGNOSIS — I214 Non-ST elevation (NSTEMI) myocardial infarction: Secondary | ICD-10-CM | POA: Diagnosis not present

## 2020-04-03 DIAGNOSIS — F17210 Nicotine dependence, cigarettes, uncomplicated: Secondary | ICD-10-CM

## 2020-04-03 DIAGNOSIS — I10 Essential (primary) hypertension: Secondary | ICD-10-CM | POA: Diagnosis not present

## 2020-04-03 DIAGNOSIS — I2510 Atherosclerotic heart disease of native coronary artery without angina pectoris: Secondary | ICD-10-CM | POA: Diagnosis not present

## 2020-04-03 DIAGNOSIS — D649 Anemia, unspecified: Secondary | ICD-10-CM | POA: Diagnosis not present

## 2020-04-03 DIAGNOSIS — J9611 Chronic respiratory failure with hypoxia: Secondary | ICD-10-CM | POA: Diagnosis not present

## 2020-04-03 DIAGNOSIS — F322 Major depressive disorder, single episode, severe without psychotic features: Secondary | ICD-10-CM | POA: Diagnosis not present

## 2020-04-03 DIAGNOSIS — E039 Hypothyroidism, unspecified: Secondary | ICD-10-CM | POA: Diagnosis not present

## 2020-04-03 MED ORDER — ALBUTEROL SULFATE HFA 108 (90 BASE) MCG/ACT IN AERS
INHALATION_SPRAY | RESPIRATORY_TRACT | 11 refills | Status: DC
Start: 2020-04-03 — End: 2021-04-20

## 2020-04-03 MED ORDER — PREDNISONE 10 MG PO TABS
ORAL_TABLET | ORAL | 0 refills | Status: DC
Start: 2020-04-03 — End: 2020-04-26

## 2020-04-03 MED ORDER — BREZTRI AEROSPHERE 160-9-4.8 MCG/ACT IN AERO: 2.0000 | INHALATION_SPRAY | Freq: Two times a day (BID) | RESPIRATORY_TRACT | 0 refills | Status: AC

## 2020-04-03 NOTE — Progress Notes (Signed)
Subjective:     Patient ID: [name], male   DOB: 1951/01/12,   MRN: [id number]    Brief patient profile: 86 yowm active smoker with h/o IHD then sob both with exertion and at rest and noct x summer 2016 eval by Dr Annamaria Boots in 11/14/14 rec anoro and temporarily improved but says could not get it refilled and since then just using saba in multiple forms so referred to pulmonary clinic 04/30/2016 by Dr   Earle Gell and unable to perform spirometry    04/30/2016 1st [place] [facility] office visit/ Wert on saba only   Chief Complaint  Patient presents with  . Pulmonary Consult    Referred by Dr. Earle Gell for eval of COPD.  Pt states he was dxed with COPD approx 1 yr ago. He has been on o2 24/7 since July 2018.  He is using neb with albuterol and ventolin inhaler approx 3 x per day. He states that he feels SOB with exertion such as walking to the mailbox. He is sometimes SOB just sitting and occ wakes up with SOB.    Lives in [place]:   MB is  downhill and has to stop, uses inhaler and no problem at all walking back to house s 02  Never tried to  Use saba  before exertion/ last used one hour prior to OV  sob  can occur s warning at rest even if not assoc cough or cp rec  Plan A = Automatic = symbicort 160 Take 2 puffs first thing in am and then another 2 puffs about 12 hours later.  Work on inhaler technique:  relax and gently blow all the way out then take a nice smooth deep breath back in, triggering the inhaler at same time you start breathing in.  Hold for up to 5 seconds if you can. Blow out thru nose. Rinse and gargle with water when done Plan B = Backup Only use your albuterol (VENTOLIN)  as a rescue medication Plan C = Crisis - only use your albuterol nebulizer if you first try Plan B and it fails to help > ok to use the nebulizer up to every 4 hours but if start needing it regularly call for immediate appointment The key is to stop smoking completely before smoking completely stops  you!    10/11/2016  f/u ov/Wert re:  Copd / 02 dep with actiivty 4lpm and sleep 3lpm  And symb 160 2bid / still smoking Chief Complaint  Patient presents with  . Follow-up    He had stopped smoking for approx 1 month, and then started back due to stress after spouse had a stroke. He has a cough but no sputum production. He states his chest "burns". He has been having increased DOE over the past month- winded walking just from room to room.    saba once a day 3-6pm p am symb 6 am and hfa suboptimal and still smoking  Lots of rattling congestion but not able to bring up mucus/ sleeps ok on 3lpm / chest discomfort diffuse/ ant with coughing fits  Doe = MMRC3 = can't walk 100 yards even at a slow pace at a flat grade s stopping due to sob   rec Cough / congestion > mucinex or mucinex dm up to 1200 mg every 12 hours as needed  Monitor 02 sats with exertion and adjust to keep it over 90% Work on inhaler technique:  Please remember to go to the  x-ray department  downstairs in the basement  for your tests - we will call you with the results when they are available. The key is to stop smoking completely before smoking completely stops you!     Please schedule a follow up office visit in 6 weeks, call sooner if needed with pfts > did not do     04/03/2020  f/u ov/Wert re: copd / on trelegy  Chief Complaint  Patient presents with  . Consult    SOB with activity  Dyspnea:  Doe across the room x 4-5 months  Cough: rattle esp in am  but never comes up/ nasal drainage is clear  Sleeping: "23 degrees" on electric bed setting/  one pillow = baseline SABA use: one a week "can't do without"  02:  3.5 hs and prn during the day  Covid status:   Never shots/ never infected    No obvious day to day or daytime variability or assoc excess/ purulent sputum or mucus plugs or hemoptysis or cp or chest tightness, subjective wheeze or overt sinus or hb symptoms.   Sleeping  without nocturnal   exacerbation  of  respiratory  c/o's or need for noct saba. Also denies any obvious fluctuation of symptoms with weather or environmental changes or other aggravating or alleviating factors except as outlined above   No unusual exposure hx or h/o childhood pna/ asthma or knowledge of premature birth.  Current Allergies, Complete Past Medical History, Past Surgical History, Family History, and Social History were reviewed in [employer] record.  ROS  The following are not active complaints unless bolded Hoarseness, sore throat, dysphagia, dental problems, itching, sneezing,  nasal congestion or discharge of excess mucus or purulent secretions, ear ache,   fever, chills, sweats, unintended wt loss or wt gain, classically pleuritic or exertional cp,  orthopnea pnd or arm/hand swelling  or leg swelling, presyncope, palpitations, abdominal pain, anorexia, nausea, vomiting, diarrhea  or change in bowel habits or change in bladder habits, change in stools or change in urine, dysuria, hematuria,  rash, arthralgias, visual complaints, headache, numbness, weakness or ataxia or problems with walking or coordination,  change in mood or  memory.        Current Meds  Medication Sig  . acetaminophen (TYLENOL) 500 MG tablet Take 500 mg by mouth every 6 (six) hours as needed for mild pain or headache.  . albuterol (PROVENTIL) (2.5 MG/3ML) 0.083% nebulizer solution Take 3 mLs (2.5 mg total) by nebulization every 6 (six) hours as needed for wheezing or shortness of breath.  Marland Kitchen albuterol (VENTOLIN HFA) 108 (90 Base) MCG/ACT inhaler Inhale 2 puffs into the lungs every 6 (six) hours as needed for wheezing or shortness of breath.  . ALPRAZolam (XANAX) 0.5 MG tablet Take 0.5 mg by mouth 2 (two) times daily as needed for anxiety.   Marland Kitchen aspirin EC 81 MG tablet Take 1 tablet (81 mg total) by mouth daily.  Marland Kitchen atorvastatin (LIPITOR) 40 MG tablet Take 1 tablet (40 mg total) by mouth daily.  .    . busPIRone (BUSPAR) 7.5 MG  tablet Take 1 tablet (7.5 mg total) by mouth 3 (three) times daily.  . clopidogrel (PLAVIX) 75 MG tablet TAKE ONE TABLET BY MOUTH DAILY  . escitalopram (LEXAPRO) 5 MG tablet Take 5 mg by mouth daily.  . feeding supplement, ENSURE ENLIVE, (ENSURE ENLIVE) LIQD Take 237 mLs by mouth 2 (two) times daily between meals.  . gabapentin (NEURONTIN) 300 MG capsule Take 300 mg by  mouth 3 (three) times daily.  . isosorbide mononitrate (IMDUR) 30 MG 24 hr tablet Take 1 tablet (30 mg total) by mouth daily.  Marland Kitchen levothyroxine (SYNTHROID, LEVOTHROID) 50 MCG tablet Take 50 mcg by mouth daily.  . metoprolol tartrate (LOPRESSOR) 25 MG tablet Take 1 tablet (25 mg total) by mouth 3 (three) times daily.  . nitroGLYCERIN (NITROSTAT) 0.4 MG SL tablet Place 1 tablet (0.4 mg total) under the tongue every 5 (five) minutes as needed for chest pain.  . OXYGEN Inhale 3.5 L into the lungs daily as needed (for shortness of breath). 3-3.5lpm 24/7  . pantoprazole (PROTONIX) 40 MG tablet Take 1 tablet (40 mg total) by mouth at bedtime.  . traMADol (ULTRAM) 50 MG tablet Take 2 tablets (100 mg total) by mouth every 8 (eight) hours as needed for severe pain.  . traZODone (DESYREL) 100 MG tablet Take 1 tablet (100 mg total) by mouth at bedtime as needed for sleep.         Objective:   Physical Exam      04/03/2020        173 10/11/2016       144   04/30/16 143 lb (64.9 kg)  11/17/15 142 lb 10.2 oz (64.7 kg)  08/03/15 150 lb (68 kg)    Vital signs reviewed  04/03/2020  - Note at rest 02 sats  95% on RA   General appearance:    amb chronically ill appearing wm nad     Reports:   edentulous   HEENT : pt wearing mask not removed for exam due to covid -19 concerns.    NECK :  without JVD/Nodes/TM/ nl carotid upstrokes bilaterally   LUNGS: no acc muscle use,  Mod barrel  contour chest wall with bilateral  Distant bs s audible wheeze and  without cough on insp or exp maneuvers and mod  Hyperresonant  to  percussion bilaterally      CV:  RRR  no s3 or murmur or increase in P2, and no edema   ABD:  soft and nontender with pos mid insp Hoover's  in the supine position. No bruits or organomegaly appreciated, bowel sounds nl  MS:     ext warm without deformities, calf tenderness, cyanosis or clubbing No obvious joint restrictions   SKIN: warm and dry without lesions    NEURO:  alert, approp, nl sensorium with  no motor or cerebellar deficits apparent. Mild resting tremor           I personally reviewed images and agree with radiology impression as follows:   Chest CTa  04/04/20 Stable emphysematous lung disease and pulmonary scarring.         Assessment:

## 2020-04-03 NOTE — Patient Instructions (Addendum)
The key is to stop smoking completely before smoking completely stops you!  Plan A = Automatic = Always=    Breztri Take 2 puffs first thing in am and then another 2 puffs about 12 hours later.    Work on inhaler technique:  relax and gently blow all the way out then take a nice smooth deep breath back in, triggering the inhaler at same time you start breathing in.  Hold for up to 5 seconds if you can. Blow out thru nose. Rinse and gargle with water when done   Plan B = Backup (to supplement plan A, not to replace it) Only use your albuterol inhaler as a rescue medication to be used if you can't catch your breath by resting or doing a relaxed purse lip breathing pattern.  - The less you use it, the better it will work when you need it. - Ok to use the inhaler up to 2 puffs  every 4 hours if you must but call for appointment if use goes up over your usual need - Don't leave home without it !!  (think of it like the spare tire for your car)   Plan C = Crisis (instead of Plan B but only if Plan B stops working) - only use your albuterol nebulizer if you first try Plan B and it fails to help > ok to use the nebulizer up to every 4 hours but if start needing it regularly call for immediate appointment   Prednisone 10 mg take  4 each am x 2 days,   2 each am x 2 days,  1 each am x 2 days and stop   Make sure you check your oxygen saturation  at your highest level of activity  to be sure it stays over 90% and adjust  02 flow upward to maintain this level if needed but remember to turn it back to previous settings when you stop (to conserve your supply).    Please schedule a follow up office visit in  3 weeks, sooner if needed - bring all medications to the [facility] office

## 2020-04-04 ENCOUNTER — Ambulatory Visit
Admission: RE | Admit: 2020-04-04 | Discharge: 2020-04-04 | Disposition: A | Discharge status: Home / Self Care | Payer: Medicare HMO | Source: Ambulatory Visit | Attending: Thoracic Surgery (Cardiothoracic Vascular Surgery) | Admitting: Thoracic Surgery (Cardiothoracic Vascular Surgery)

## 2020-04-04 DIAGNOSIS — I714 Abdominal aortic aneurysm, without rupture, unspecified: Secondary | ICD-10-CM

## 2020-04-04 DIAGNOSIS — I7101 Dissection of thoracic aorta: Secondary | ICD-10-CM | POA: Diagnosis not present

## 2020-04-04 DIAGNOSIS — I7102 Dissection of abdominal aorta: Secondary | ICD-10-CM

## 2020-04-04 MED ORDER — IOPAMIDOL (ISOVUE-370) INJECTION 76%
75.0000 mL | Freq: Once | INTRAVENOUS | Status: AC | PRN
  Administered 2020-04-04: 75 mL via INTRAVENOUS

## 2020-04-05 ENCOUNTER — Encounter: Payer: Self-pay | Admitting: Internal Medicine

## 2020-04-05 NOTE — Assessment & Plan Note (Signed)
Active smoker - 04/30/2016   try symbicort 160 2bid  - 04/03/2020  After extensive coaching inhaler device,  effectiveness =    90% try breztri 2 every 12 hours x 4 weeks samples  And f/u in [place]    Group D in terms of symptom/risk and laba/lama/ICS  therefore appropriate rx at this point >>>  breztri trial and approp saba  Re saba: I spent extra time with pt today reviewing appropriate use of albuterol for prn use on exertion with the following points: 1) saba is for relief of sob that does not improve by walking a slower pace or resting but rather if the pt does not improve after trying this first. 2) If the pt is convinced, as many are, that saba helps recover from activity faster then it's easy to tell if this is the case by re-challenging : ie stop, take the inhaler, then p 5 minutes try the exact same activity (intensity of workload) that just caused the symptoms and see if they are substantially diminished or not after saba 3) if there is an activity that reproducibly causes the symptoms, try the saba 15 min before the activity on alternate days   If in fact the saba really does help, then fine to continue to use it prn but advised may need to look closer at the maintenance regimen being used to achieve better control of airways disease with exertion.

## 2020-04-05 NOTE — Assessment & Plan Note (Addendum)
Counseled re importance of smoking cessation but did not meet time criteria for separate billing            Each maintenance medication was reviewed in detail including emphasizing most importantly the difference between maintenance and prns and under what circumstances the prns are to be triggered using an action plan format where appropriate.  Total time for H and P, chart review, counseling, reviewing hfa/02 device(s) and generating customized AVS unique to this "new pt " (as not seen in > 3 y)office visit / same day charting = 45 min

## 2020-04-05 NOTE — Assessment & Plan Note (Signed)
As of 04/03/2020  =  3.5 lpm hs and prn daytime  Advised: Make sure you check your oxygen saturation  at your highest level of activity  to be sure it stays over 90% and adjust  02 flow upward to maintain this level if needed but remember to turn it back to previous settings when you stop (to conserve your supply).

## 2020-04-11 ENCOUNTER — Other Ambulatory Visit: Payer: Self-pay

## 2020-04-11 ENCOUNTER — Ambulatory Visit: Payer: Medicare HMO | Admitting: Thoracic Surgery (Cardiothoracic Vascular Surgery)

## 2020-04-11 VITALS — BP 115/67 | HR 79 | Resp 20 | Ht 70.0 in | Wt 178.0 lb

## 2020-04-11 DIAGNOSIS — I712 Thoracic aortic aneurysm, without rupture, unspecified: Secondary | ICD-10-CM

## 2020-04-11 NOTE — Progress Notes (Signed)
[address]       [place] [address]             [phone]     HPI: [name] returns for follow-up of his descending thoracic dissection/aneurysm.  [name] is a 71 year old man with a history of severe atherosclerotic cardiovascular disease including CAD, CABG, drug-eluting stents, type III aortic dissection, descending thoracic aneurysm, and an infrarenal abdominal aneurysm.  Medical history is also significant for tobacco abuse, COPD, chronic back pain, anxiety, and depression.  He was a heavy smoker prior to quitting in 2019.  I did coronary bypass grafting in 2009.  He did well until a couple of years ago when he had recurrent angina.  He was found to have patent mammary to LAD.  He had severe native vessel disease also some disease in his other grafts.  He had some stenting done.  His most recent cath showed good perfusion of the LAD and right coronary distributions.  He had a type III aortic dissection in 2008.  He has been followed since then for some aneurysmal dilatation in his distal arch/descending thoracic aorta as well as an infrarenal aneurysm.  On his most recent CT 6 months ago both of those areas were stable at around 4.9 cm.  He has been having quite a bit of difficulty with shortness of breath.  He recently saw Dr. Melvyn Novas and had some adjustments made to his inhalers.  Past Medical History:  Diagnosis Date  . Anemia   . Anxiety   . Aortic dissection (HCC)    TYPE 3  . CAD (coronary artery disease)    multiple, most recently Jan-March 2017  . Cardiac tamponade   . Chronic bronchitis ([facility])   . Chronic fatigue   . Chronic pain   . Constipation   . COPD (chronic obstructive pulmonary disease) ([facility])   . Depression   . Dysphagia, oropharyngeal phase   . ETOH abuse   . GERD (gastroesophageal reflux disease)   . Hiatal hernia   . HTN (hypertension)   . Hypercholesterolemia   . Metabolic encephalopathy   . Opioid abuse ([place])   . Peptic  ulcer disease 08/2010   EGD   . PNA (pneumonia)   . Schatzki's ring     Current Outpatient Medications  Medication Sig Dispense Refill  . acetaminophen (TYLENOL) 500 MG tablet Take 500 mg by mouth every 6 (six) hours as needed for mild pain or headache.    . albuterol (PROAIR HFA) 108 (90 Base) MCG/ACT inhaler 2 puffs every 4 hours as needed only  if your can't catch your breath 18 g 11  . albuterol (PROVENTIL) (2.5 MG/3ML) 0.083% nebulizer solution Take 3 mLs (2.5 mg total) by nebulization every 6 (six) hours as needed for wheezing or shortness of breath.    . ALPRAZolam (XANAX) 0.5 MG tablet Take 0.5 mg by mouth 2 (two) times daily as needed for anxiety.     Marland Kitchen aspirin EC 81 MG tablet Take 1 tablet (81 mg total) by mouth daily. 30 tablet 12  . atorvastatin (LIPITOR) 40 MG tablet Take 1 tablet (40 mg total) by mouth daily. 90 tablet 3  . Budeson-Glycopyrrol-Formoterol (BREZTRI AEROSPHERE) 160-9-4.8 MCG/ACT AERO Inhale 2 puffs into the lungs 2 (two) times daily. 5.9 g 0  . busPIRone (BUSPAR) 7.5 MG tablet Take 1 tablet (7.5 mg total) by mouth 3 (three) times daily. 45 tablet 0  . clopidogrel (PLAVIX) 75 MG tablet  TAKE ONE TABLET BY MOUTH DAILY 90 tablet 1  . escitalopram (LEXAPRO) 5 MG tablet Take 5 mg by mouth daily.    . feeding supplement, ENSURE ENLIVE, (ENSURE ENLIVE) LIQD Take 237 mLs by mouth 2 (two) times daily between meals.    . gabapentin (NEURONTIN) 300 MG capsule Take 300 mg by mouth 3 (three) times daily.    . isosorbide mononitrate (IMDUR) 30 MG 24 hr tablet Take 1 tablet (30 mg total) by mouth daily. 90 tablet 3  . levothyroxine (SYNTHROID, LEVOTHROID) 50 MCG tablet Take 50 mcg by mouth daily.    . metoprolol tartrate (LOPRESSOR) 25 MG tablet Take 1 tablet (25 mg total) by mouth 3 (three) times daily. 270 tablet 3  . nitroGLYCERIN (NITROSTAT) 0.4 MG SL tablet Place 1 tablet (0.4 mg total) under the tongue every 5 (five) minutes as needed for chest pain. 25 tablet 3  . OXYGEN  Inhale 3.5 L into the lungs daily as needed (for shortness of breath). 3-3.5lpm 24/7    . pantoprazole (PROTONIX) 40 MG tablet Take 1 tablet (40 mg total) by mouth at bedtime. 30 tablet 0  . predniSONE (DELTASONE) 10 MG tablet Take  4 each am x 2 days,   2 each am x 2 days,  1 each am x 2 days and stop 14 tablet 0  . traMADol (ULTRAM) 50 MG tablet Take 2 tablets (100 mg total) by mouth every 8 (eight) hours as needed for severe pain. 20 tablet 0  . traZODone (DESYREL) 100 MG tablet Take 1 tablet (100 mg total) by mouth at bedtime as needed for sleep. 30 tablet 0   No current facility-administered medications for this visit.    Physical Exam BP 115/67   Pulse 79   Resp 20   Ht 5\' 10"  (1.778 m)   Wt 178 lb (80.7 kg)   SpO2 93% Comment: RA  BMI 25.22 kg/m  70 year old man in no acute distress, appears older than stated age Alert and oriented x3 with no focal neurologic deficit Cardiac regular rate and rhythm, no murmur Lungs diminished breath sounds bilaterally, equal, no rales or wheezes  Diagnostic Tests:  CT ANGIOGRAPHY CHEST, ABDOMEN AND PELVIS  TECHNIQUE: Non-contrast CT of the chest was initially obtained.  Multidetector CT imaging through the chest, abdomen and pelvis was performed using the standard protocol during bolus administration of intravenous contrast. Multiplanar reconstructed images and MIPs were obtained and reviewed to evaluate the vascular anatomy.  CONTRAST:  62mL ISOVUE-370 IOPAMIDOL (ISOVUE-370) INJECTION 76%  Creatinine was obtained on site at [facility] at [address]  Results: Creatinine 1.1 mg/dL.  Estimated GFR 66 mL/minute.  COMPARISON:  09/28/2019  FINDINGS: CTA CHEST FINDINGS  Cardiovascular: Stable configuration, size and morphology of aneurysmal disease of the distal arch and descending thoracic aorta with associated chronic dissection. The distal arch measures approximately 4.8 cm in greatest diameter and  appears stable. The proximal descending thoracic aorta measures 4.4-4.6 cm, the mid descending thoracic aorta measures 3.8-4.0 cm and the distal descending thoracic aorta measures 4.2 cm. The aortic root and ascending thoracic aorta are normal in caliber and demonstrates stable calcification. Stable patency of visualized proximal great vessels without significant occlusive disease, dissection or aneurysmal disease. Stable penetrating ulcer along the superior aspect of the aneurysmal distal arch measuring up to 2.3 cm in greatest diameter.  The heart size is normal. No pericardial fluid identified. Stable appearance by CT a of coronary bypass grafts. Central pulmonary arteries are normal  in caliber.  Mediastinum/Nodes: No enlarged mediastinal, hilar, or axillary lymph nodes. Thyroid gland, trachea, and esophagus demonstrate no significant findings.  Lungs/Pleura: Stable emphysematous lung disease and pulmonary scarring. There is no evidence of pulmonary edema, consolidation, pneumothorax, nodule or pleural fluid.  Musculoskeletal: No chest wall abnormality. No acute or significant osseous findings.  Review of the MIP images confirms the above findings.  CTA ABDOMEN AND PELVIS FINDINGS  VASCULAR  Aorta: Dissection of the thoracic aorta continues to the level of the mid abdominal aorta and terminates just below the level of the left renal artery. Aneurysmal dilatation of the infrarenal abdominal aorta again noted which appears relatively stable and measures approximately 4.6 x 4.8 cm in maximal diameter. No evidence of aneurysm rupture. Amount of circumferential mural thrombus in the aneurysm appears stable.  Celiac: Stable mild 40% stenosis at the origin of the celiac axis.  SMA: No significant stenosis.  Renals: Stable patency of the renal arteries with no significant stenosis on the right. The proximal left renal artery again demonstrates moderate narrowing  potentially related to a combination of dissection and atherosclerosis.  IMA: Occluded origin.  Inflow: Stable dilatation of the left common iliac artery up to approximately 1.7 cm. No significant occlusive disease of the common or external iliac arteries. Stable disease involving the internal iliac artery trunks. Common femoral arteries and femoral bifurcations demonstrate normal patency. Stable stenosis of the proximal left SFA.  Review of the MIP images confirms the above findings.  NON-VASCULAR  Hepatobiliary: No focal liver abnormality is seen. No gallstones, gallbladder wall thickening, or biliary dilatation.  Pancreas: Unremarkable. No pancreatic ductal dilatation or surrounding inflammatory changes.  Spleen: Normal in size without focal abnormality.  Adrenals/Urinary Tract: Adrenal glands are unremarkable. Kidneys are normal, without renal calculi, focal lesion, or hydronephrosis. Bladder is unremarkable.  Stomach/Bowel: No evidence of bowel obstruction, ileus or inflammation. No free air. Stable diverticulosis of the colon.  Lymphatic: No enlarged abdominal or pelvic lymph nodes identified.  Reproductive: Prostate is unremarkable.  Other: No abdominal wall hernia or abnormality. No abdominopelvic ascites.  Musculoskeletal: Stable appearance of posterior lumbar fusion at the L4-5 and L5-S1 levels.  Review of the MIP images confirms the above findings.  IMPRESSION: 1. Stable configuration, size and morphology of aneurysmal disease of the distal arch and descending thoracic aorta with associated chronic dissection. Maximal diameter of the thoracic aorta is approximately 4.8 cm at the level of the distal arch. 2. Stable penetrating ulcer along the superior aspect of the aneurysmal distal arch measuring up to 2.3 cm in greatest diameter. 3. Stable aneurysmal disease of the infrarenal abdominal aorta measuring 4.6 x 4.8 cm in maximal diameter. No  evidence of aneurysm rupture. Recommend follow-up CT/MR every 6 months and vascular consultation. This recommendation follows ACR consensus guidelines: White Paper of the ACR Incidental Findings Committee II on Vascular Findings. J Am Coll Radiol 2013; 10:789-794. 4. Stable moderate narrowing of the proximal left renal artery potentially related to a combination of dissection and atherosclerosis. 5. Stable dilatation of the left common iliac artery up to 1.7 cm. 6. Stable mild stenosis at the origin of the celiac axis. 7. Stable stenosis of the proximal left SFA. 8. Stable emphysematous lung disease and pulmonary scarring.  Aortic Atherosclerosis (ICD10-I70.0) and Emphysema (ICD10-J43.9).   Electronically Signed   By: Aletta Edouard M.D.   On: 04/04/2020 15:25  I personally reviewed the CT angiogram of his chest abdomen and pelvis.  No significant change from his prior study.  Persistent dissection with a mostly thrombosed false lumen.  Some aneurysmal dilatation in the proximal descending and infrarenal abdominal aorta  Impression: [name] is a 70 year old man with a history of severe atherosclerotic cardiovascular disease including CAD, CABG, drug-eluting stents, type III aortic dissection, descending thoracic aneurysm, and an infrarenal abdominal aneurysm.  Medical history is also significant for tobacco abuse, COPD, chronic back pain, anxiety, and depression.  He was a heavy smoker prior to quitting in 2019.  Type III aortic dissection with thoracic and abdominal aortic aneurysms-stable changes by CT.  Needs continued semiannual follow-up.  Importance of blood pressure control was emphasized.  CAD-previous CABG and subsequent percutaneous interventions a couple of years ago.  Overall myocardial perfusion is reasonably good.  Does have some deficits in circumflex distribution according to catheterization.  Managed by [name].  COPD-being managed by Dr. Melvyn Novas.  Emphasize  compliance with bronchodilators.  He quit smoking in 2019.  Plan: Follow-up with [name] and Dr. Melvyn Novas Return in 6 months with CT angiogram of chest abdomen and pelvis  Melrose Nakayama, MD [facility] [phone]

## 2020-04-19 DIAGNOSIS — F411 Generalized anxiety disorder: Secondary | ICD-10-CM | POA: Diagnosis not present

## 2020-04-19 DIAGNOSIS — I10 Essential (primary) hypertension: Secondary | ICD-10-CM | POA: Diagnosis not present

## 2020-04-19 DIAGNOSIS — G894 Chronic pain syndrome: Secondary | ICD-10-CM | POA: Diagnosis not present

## 2020-04-19 DIAGNOSIS — E039 Hypothyroidism, unspecified: Secondary | ICD-10-CM | POA: Diagnosis not present

## 2020-04-19 DIAGNOSIS — J449 Chronic obstructive pulmonary disease, unspecified: Secondary | ICD-10-CM | POA: Diagnosis not present

## 2020-04-19 DIAGNOSIS — E7800 Pure hypercholesterolemia, unspecified: Secondary | ICD-10-CM | POA: Diagnosis not present

## 2020-04-19 DIAGNOSIS — I2510 Atherosclerotic heart disease of native coronary artery without angina pectoris: Secondary | ICD-10-CM | POA: Diagnosis not present

## 2020-04-19 DIAGNOSIS — M48061 Spinal stenosis, lumbar region without neurogenic claudication: Secondary | ICD-10-CM | POA: Diagnosis not present

## 2020-04-19 DIAGNOSIS — I714 Abdominal aortic aneurysm, without rupture: Secondary | ICD-10-CM | POA: Diagnosis not present

## 2020-04-24 ENCOUNTER — Encounter: Payer: Medicare HMO | Admitting: Vascular Surgery

## 2020-04-25 ENCOUNTER — Other Ambulatory Visit: Payer: Self-pay

## 2020-04-25 NOTE — Patient Outreach (Signed)
[facility]) Care Management  04/25/2020  [name] 12-27-1950 [id number]   Telephone Assessment Quarterly Call    Unsuccessful outreach to patient. No answer at present.     Plan: RN CM will send unsuccessful letter to patient. RN CM will make quarterly outreach attempt to patient within the month of July.  [name], RN,BSN,CCM [facility] Telephonic Care Management Coordinator Direct Phone: [phone] Toll Free: [phone] Fax: [phone]

## 2020-04-26 ENCOUNTER — Ambulatory Visit: Payer: Self-pay

## 2020-04-26 ENCOUNTER — Encounter: Payer: Self-pay | Admitting: Internal Medicine

## 2020-04-26 ENCOUNTER — Other Ambulatory Visit: Payer: Self-pay

## 2020-04-26 ENCOUNTER — Ambulatory Visit: Payer: Medicare HMO | Admitting: Internal Medicine

## 2020-04-26 DIAGNOSIS — J9611 Chronic respiratory failure with hypoxia: Secondary | ICD-10-CM | POA: Diagnosis not present

## 2020-04-26 DIAGNOSIS — J449 Chronic obstructive pulmonary disease, unspecified: Secondary | ICD-10-CM

## 2020-04-26 MED ORDER — PREDNISONE 10 MG PO TABS: ORAL_TABLET | ORAL | 0 refills | Status: DC

## 2020-04-26 MED ORDER — BREZTRI AEROSPHERE 160-9-4.8 MCG/ACT IN AERO
2.0000 | INHALATION_SPRAY | Freq: Two times a day (BID) | RESPIRATORY_TRACT | 0 refills | Status: DC
Start: 2020-04-26 — End: 2020-04-26

## 2020-04-26 NOTE — Patient Instructions (Addendum)
Plan A = Automatic = Always=    Breztri Take 2 puffs first thing in am and then another 2 puffs about 12 hours later.    Work on inhaler technique:  relax and gently blow all the way out then take a nice smooth deep breath back in, triggering the inhaler at same time you start breathing in.  Hold for up to 5 seconds if you can. Blow out thru nose. Rinse and gargle with water when done      Plan B = Backup (to supplement plan A, not to replace it) Only use your albuterol inhaler as a rescue medication to be used if you can't catch your breath by resting or doing a relaxed purse lip breathing pattern.  - The less you use it, the better it will work when you need it. - Ok to use the inhaler up to 2 puffs  every 4 hours if you must but call for appointment if use goes up over your usual need - Don't leave home without it !!  (think of it like the spare tire for your car)   Plan C = Crisis (instead of Plan B but only if Plan B stops working) - only use your albuterol nebulizer if you first try Plan B and it fails to help > ok to use the nebulizer up to every 4 hours but if start needing it regularly call for immediate appointment   Also ok to Try albuterol 15 min before an activity (one day the inhaler, then next the nebulizer, then next nothing)  that you know would make you short of breath and see if it makes any difference and if makes none then don't take it after activity unless you can't catch your breath.  Make sure you check your oxygen saturation  at your highest level of activity  to be sure it stays over 90% and adjust  02 flow upward to maintain this level if needed but remember to turn it back to previous settings when you stop (to conserve your supply).  Please remember to go to the lab department @ [facility] for your tests - we will call you with the results when they are available.       Please schedule a follow up office visit in 2 weeks and bring all inhalers and  solutions. Add: says has half  A breztri still at home so given 2 samples to do counting on return

## 2020-04-26 NOTE — Assessment & Plan Note (Signed)
As of 04/03/2020  =  3.5 lpm hs and prn daytime -  04/26/2020   Walked 4lpm   approx   200 ft  @ slow pace  stopped due to sob with lowest sats 93%  Advised to use 4 lpm 24/ 7 and f/u in 2 weeks with all inhalers/ neb solutions in hand  Each maintenance medication was reviewed in detail including emphasizing most importantly the difference between maintenance and prns and under what circumstances the prns are to be triggered using an action plan format where appropriate.  Total time for H and P, chart review, counseling, reviewing hfa/02 device(s) , directly observing portions of ambulatory 02 saturation study/ and generating customized AVS unique to this office visit / same day charting = 45 min

## 2020-04-26 NOTE — Assessment & Plan Note (Addendum)
Active smoker > says quit 12/2019  - 04/30/2016   try symbicort 160 2bid  - 04/03/2020  After extensive coaching inhaler device,  effectiveness =    90% try breztri 2 every 12 hours x 4 weeks samples  And f/u in [place]  - Labs ordered 04/26/2020  :  allergy profile   alpha one AT phenotype    DDX of  difficult airways management almost all start with A and  include Adherence, Ace Inhibitors, Acid Reflux, Active Sinus Disease, Alpha 1 Antitripsin deficiency, Anxiety masquerading as Airways dz,  ABPA,  Allergy(esp in young), Aspiration (esp in elderly), Adverse effects of meds,  Active smoking or vaping, A bunch of PE's (a small clot burden can't cause this syndrome unless there is already severe underlying pulm or vascular dz with poor reserve) plus two Bs  = Bronchiectasis and Beta blocker use..and one C= CHF   Adherence is always the initial "prime suspect" and is a multilayered concern that requires a "trust but verify" approach in every patient - starting with knowing how to use medications, especially inhalers, correctly, keeping up with refills and understanding the fundamental difference between maintenance and prns vs those medications only taken for a very short course and then stopped and not refilled.  - - The proper method of use, as well as anticipated side effects, of a metered-dose inhaler were discussed and demonstrated to the patient using teach back method. Improved effectiveness after extensive coaching during this visit to a level of approximately 75 % from a baseline of 50 % > continue breztri 2bid and prn saba  Re saba: I spent extra time with pt today reviewing appropriate use of albuterol for prn use on exertion with the following points: 1) saba is for relief of sob that does not improve by walking a slower pace or resting but rather if the pt does not improve after trying this first. 2) If the pt is convinced, as many are, that saba helps recover from activity faster then it's  easy to tell if this is the case by re-challenging : ie stop, take the inhaler, then p 5 minutes try the exact same activity (intensity of workload) that just caused the symptoms and see if they are substantially diminished or not after saba 3) if there is an activity that reproducibly causes the symptoms, try the saba 15 min before the activity on alternate days   If in fact the saba really does help, then fine to continue to use it prn but advised may need to look closer at the maintenance regimen being used to achieve better control of airways disease with exertion.   ? Active smoking > denies  ? Anxiety/depression / conditioning  > usually at the bottom of this list of usual suspects but should be much higher on this pt's based on H and P and note already on psychotropics and may interfere with adherence and also interpretation of response or lack thereof to symptom management which can be quite subjective.   ? Allergy/asthma >send profile >>  Prednisone 10 mg take  4 each am x 2 days,   2 each am x 2 days,  1 each am x 2 days and stop   ? Alpha one AT > send phenotype  ? CHF component > check bnp

## 2020-04-26 NOTE — Progress Notes (Signed)
Subjective:     Patient ID: [name], male   DOB: Jun 20, 1950,   MRN: [id number]    Brief patient profile: 27 yowm reports quit smoking 12/2019 with h/o IHD then sob both with exertion and at rest and noct x summer 2016 eval by Dr Annamaria Boots in 11/14/14 rec anoro and temporarily improved but says could not get it refilled and since then just using saba in multiple forms so referred to pulmonary clinic 04/30/2016 by Dr   Earle Gell and unable to perform spirometry    04/30/2016 1st [facility] office visit/ Dinnis Rog on saba only   Chief Complaint  Patient presents with  . Pulmonary Consult    Referred by Dr. Earle Gell for eval of COPD.  Pt states he was dxed with COPD approx 1 yr ago. He has been on o2 24/7 since July 2018.  He is using neb with albuterol and ventolin inhaler approx 3 x per day. He states that he feels SOB with exertion such as walking to the mailbox. He is sometimes SOB just sitting and occ wakes up with SOB.    Lives in [place]:   MB is  downhill and has to stop, uses inhaler and no problem at all walking back to house s 02  Never tried to  Use saba  before exertion/ last used one hour prior to OV  sob  can occur s warning at rest even if not assoc cough or cp rec  Plan A = Automatic = symbicort 160 Take 2 puffs first thing in am and then another 2 puffs about 12 hours later.  Work on inhaler technique:  relax and gently blow all the way out then take a nice smooth deep breath back in, triggering the inhaler at same time you start breathing in.  Hold for up to 5 seconds if you can. Blow out thru nose. Rinse and gargle with water when done Plan B = Backup Only use your albuterol (VENTOLIN)  as a rescue medication Plan C = Crisis - only use your albuterol nebulizer if you first try Plan B and it fails to help > ok to use the nebulizer up to every 4 hours but if start needing it regularly call for immediate appointment The key is to stop smoking completely before smoking  completely stops you!    10/11/2016  f/u ov/Abad Manard re:  Copd / 02 dep with actiivty 4lpm and sleep 3lpm  And symb 160 2bid / still smoking Chief Complaint  Patient presents with  . Follow-up    He had stopped smoking for approx 1 month, and then started back due to stress after spouse had a stroke. He has a cough but no sputum production. He states his chest "burns". He has been having increased DOE over the past month- winded walking just from room to room.    saba once a day 3-6pm p am symb 6 am and hfa suboptimal and still smoking  Lots of rattling congestion but not able to bring up mucus/ sleeps ok on 3lpm / chest discomfort diffuse/ ant with coughing fits  Doe = MMRC3 = can't walk 100 yards even at a slow pace at a flat grade s stopping due to sob   rec Cough / congestion > mucinex or mucinex dm up to 1200 mg every 12 hours as needed  Monitor 02 sats with exertion and adjust to keep it over 90% Work on inhaler technique:  Please remember to go to the  x-ray department downstairs in the basement  for your tests - we will call you with the results when they are available. The key is to stop smoking completely before smoking completely stops you!     Please schedule a follow up office visit in 6 weeks, call sooner if needed with pfts > did not do     04/03/2020  f/u ov/[name] re: copd / on trelegy  Chief Complaint  Patient presents with  . Consult    SOB with activity  Dyspnea:  Doe across the room x 4-5 months  Cough: rattle esp in am  but never comes up/ nasal drainage is clear  Sleeping: "23 degrees" on electric bed setting/  one pillow = baseline SABA use: one a week "can't do without"  02:  3.5 hs and prn during the day  Covid status:   Never shots/ never infected  rec The key is to stop smoking completely before smoking completely stops you! Plan A = Automatic = Always=    Breztri Take 2 puffs first thing in am and then another 2 puffs about 12 hours later.  Work on inhaler  technique: Plan B = Backup (to supplement plan A, not to replace it) Only use your albuterol inhaler as a rescue medication Plan C = Crisis (instead of Plan B but only if Plan B stops working) - only use your albuterol nebulizer if you first try Plan B and it fails to help  Prednisone 10 mg take  4 each am x 2 days,   2 each am x 2 days,  1 each am x 2 days and stop  Make sure you check your oxygen saturation  at your highest level of activity  Please schedule a follow up office visit in  3 weeks, sooner if needed - bring all medications to the [place] office     04/26/2020  f/u ov/[place] office/Ondine Gemme re: copd/ over use of saba says quit smoking did not bring in the breztri / neb but did bring in saba  Chief Complaint  Patient presents with  . Follow-up    Breathing has been worse over the past 2-3 wks. He is getting winded walking across the room. He is using his albuterol inhaler at least 3 x per day and has not used neb.   Dyspnea:  Mailbox is "89 steps"  slt downhill to it/ stops half way on 02  Cough: some rattle  Sleeping: 23 degrees  SABA use: very poor insight  02: 3.5 does not adjust  Covid status: ? J and J  Lung cancer screening: n/a    No obvious day to day or daytime variability or assoc excess/ purulent sputum or mucus plugs or hemoptysis or cp or chest tightness, subjective wheeze or overt sinus or hb symptoms.   Sleeps as above  without nocturnal  or early am exacerbation  of respiratory  c/o's or need for noct saba. Also denies any obvious fluctuation of symptoms with weather or environmental changes or other aggravating or alleviating factors except as outlined above   No unusual exposure hx or h/o childhood pna/ asthma or knowledge of premature birth.  Current Allergies, Complete Past Medical History, Past Surgical History, Family History, and Social History were reviewed in [employer] [employer] record.  ROS  The following are not active complaints  unless bolded Hoarseness, sore throat, dysphagia, dental problems, itching, sneezing,  nasal congestion or discharge of excess mucus or purulent secretions, ear ache,   fever,  chills, sweats, unintended wt loss or wt gain, classically pleuritic or exertional cp,  orthopnea pnd or arm/hand swelling  or leg swelling, presyncope, palpitations, abdominal pain, anorexia, nausea, vomiting, diarrhea  or change in bowel habits or change in bladder habits, change in stools or change in urine, dysuria, hematuria,  rash, arthralgias, visual complaints, headache, numbness, weakness or ataxia or problems with walking or coordination,  change in mood or  memory.        Current Meds  Medication Sig  . acetaminophen (TYLENOL) 500 MG tablet Take 500 mg by mouth every 6 (six) hours as needed for mild pain or headache.  . albuterol (PROAIR HFA) 108 (90 Base) MCG/ACT inhaler 2 puffs every 4 hours as needed only  if your can't catch your breath  . albuterol (PROVENTIL) (2.5 MG/3ML) 0.083% nebulizer solution Take 3 mLs (2.5 mg total) by nebulization every 6 (six) hours as needed for wheezing or shortness of breath.  . ALPRAZolam (XANAX) 0.5 MG tablet Take 0.5 mg by mouth 2 (two) times daily as needed for anxiety.   Marland Kitchen aspirin EC 81 MG tablet Take 1 tablet (81 mg total) by mouth daily.  Marland Kitchen atorvastatin (LIPITOR) 40 MG tablet Take 1 tablet (40 mg total) by mouth daily.  . busPIRone (BUSPAR) 7.5 MG tablet Take 1 tablet (7.5 mg total) by mouth 3 (three) times daily.  . clopidogrel (PLAVIX) 75 MG tablet TAKE ONE TABLET BY MOUTH DAILY  . escitalopram (LEXAPRO) 5 MG tablet Take 5 mg by mouth daily.  . feeding supplement, ENSURE ENLIVE, (ENSURE ENLIVE) LIQD Take 237 mLs by mouth 2 (two) times daily between meals.  . gabapentin (NEURONTIN) 300 MG capsule Take 300 mg by mouth 3 (three) times daily.  . isosorbide mononitrate (IMDUR) 30 MG 24 hr tablet Take 1 tablet (30 mg total) by mouth daily.  Marland Kitchen levothyroxine (SYNTHROID,  LEVOTHROID) 50 MCG tablet Take 50 mcg by mouth daily.  . metoprolol tartrate (LOPRESSOR) 25 MG tablet Take 1 tablet (25 mg total) by mouth 3 (three) times daily.  . nitroGLYCERIN (NITROSTAT) 0.4 MG SL tablet Place 1 tablet (0.4 mg total) under the tongue every 5 (five) minutes as needed for chest pain.  . OXYGEN Inhale 3.5 L into the lungs daily as needed (for shortness of breath). 3-3.5lpm 24/7  . pantoprazole (PROTONIX) 40 MG tablet Take 1 tablet (40 mg total) by mouth at bedtime.  . traMADol (ULTRAM) 50 MG tablet Take 2 tablets (100 mg total) by mouth every 8 (eight) hours as needed for severe pain.  . traZODone (DESYREL) 100 MG tablet Take 1 tablet (100 mg total) by mouth at bedtime as needed for sleep.                 Objective:   Physical Exam     04/26/2020         173 04/03/2020        173 10/11/2016       144   04/30/16 143 lb (64.9 kg)  11/17/15 142 lb 10.2 oz (64.7 kg)  08/03/15 150 lb (68 kg)    Vital signs reviewed  04/26/2020  - Note at rest 02 sats  98% on RA   General appearance:  Elderly wm chronically ill/ smart alec affect/ attitude    Reports:   edentulous   HEENT : pt wearing mask not removed for exam due to covid -19 concerns.    NECK :  without JVD/Nodes/TM/ nl carotid upstrokes bilaterally   LUNGS:  no acc muscle use,  Mod barrel  contour chest wall with bilateral  Distant bs s audible wheeze and  without cough on insp or exp maneuvers and mod  Hyperresonant  to  percussion bilaterally     CV:  RRR  no s3 or murmur or increase in P2, and no edema   ABD:  soft and nontender with pos mid insp Hoover's  in the supine position. No bruits or organomegaly appreciated, bowel sounds nl  MS:     ext warm without deformities, calf tenderness, cyanosis or clubbing No obvious joint restrictions   SKIN: warm and dry without lesions    NEURO:  alert, approp, nl sensorium with  no motor or cerebellar deficits apparent.              I personally reviewed  images and agree with radiology impression as follows:   Chest CTa  04/04/20 (cust on CTa for TAA)  Stable emphysematous lung disease and pulmonary scarring. There is no evidence of pulmonary edema, consolidation, pneumothorax, nodule or pleural fluid.   Labs ordered 04/26/2020  :  allergy profile   alpha one AT phenotype      Assessment:

## 2020-04-28 DIAGNOSIS — E039 Hypothyroidism, unspecified: Secondary | ICD-10-CM | POA: Diagnosis not present

## 2020-04-28 DIAGNOSIS — E7800 Pure hypercholesterolemia, unspecified: Secondary | ICD-10-CM | POA: Diagnosis not present

## 2020-04-28 DIAGNOSIS — F331 Major depressive disorder, recurrent, moderate: Secondary | ICD-10-CM | POA: Diagnosis not present

## 2020-04-28 DIAGNOSIS — J441 Chronic obstructive pulmonary disease with (acute) exacerbation: Secondary | ICD-10-CM | POA: Diagnosis not present

## 2020-04-28 DIAGNOSIS — G8929 Other chronic pain: Secondary | ICD-10-CM | POA: Diagnosis not present

## 2020-04-28 DIAGNOSIS — D649 Anemia, unspecified: Secondary | ICD-10-CM | POA: Diagnosis not present

## 2020-04-28 DIAGNOSIS — I25810 Atherosclerosis of coronary artery bypass graft(s) without angina pectoris: Secondary | ICD-10-CM | POA: Diagnosis not present

## 2020-04-28 DIAGNOSIS — I10 Essential (primary) hypertension: Secondary | ICD-10-CM | POA: Diagnosis not present

## 2020-04-28 DIAGNOSIS — J449 Chronic obstructive pulmonary disease, unspecified: Secondary | ICD-10-CM | POA: Diagnosis not present

## 2020-05-04 ENCOUNTER — Other Ambulatory Visit (HOSPITAL_COMMUNITY)
Admission: RE | Admit: 2020-05-04 | Discharge: 2020-05-04 | Disposition: A | Discharge status: Home / Self Care | Payer: Medicare HMO | Source: Ambulatory Visit | Attending: Internal Medicine | Admitting: Internal Medicine

## 2020-05-04 ENCOUNTER — Other Ambulatory Visit: Payer: Self-pay

## 2020-05-04 DIAGNOSIS — R0602 Shortness of breath: Secondary | ICD-10-CM | POA: Insufficient documentation

## 2020-05-04 DIAGNOSIS — R6889 Other general symptoms and signs: Secondary | ICD-10-CM | POA: Diagnosis not present

## 2020-05-04 DIAGNOSIS — J449 Chronic obstructive pulmonary disease, unspecified: Secondary | ICD-10-CM | POA: Insufficient documentation

## 2020-05-04 LAB — CBC WITH DIFFERENTIAL/PLATELET
Abs Immature Granulocytes: 0.04 10*3/uL (ref 0.00–0.07)
Basophils Absolute: 0 10*3/uL (ref 0.0–0.1)
Basophils Relative: 0 %
Eosinophils Absolute: 0.2 10*3/uL (ref 0.0–0.5)
Eosinophils Relative: 2 %
HCT: 41.7 % (ref 39.0–52.0)
Hemoglobin: 13.9 g/dL (ref 13.0–17.0)
Immature Granulocytes: 0 %
Lymphocytes Relative: 30 %
Lymphs Abs: 3 10*3/uL (ref 0.7–4.0)
MCH: 33.9 pg (ref 26.0–34.0)
MCHC: 33.3 g/dL (ref 30.0–36.0)
MCV: 101.7 fL — ABNORMAL HIGH (ref 80.0–100.0)
Monocytes Absolute: 0.9 10*3/uL (ref 0.1–1.0)
Monocytes Relative: 9 %
Neutro Abs: 5.7 10*3/uL (ref 1.7–7.7)
Neutrophils Relative %: 59 %
Platelets: 179 10*3/uL (ref 150–400)
RBC: 4.1 MIL/uL — ABNORMAL LOW (ref 4.22–5.81)
RDW: 13.2 % (ref 11.5–15.5)
WBC: 9.8 10*3/uL (ref 4.0–10.5)
nRBC: 0 % (ref 0.0–0.2)

## 2020-05-04 LAB — BASIC METABOLIC PANEL
Anion gap: 12 (ref 5–15)
BUN: 12 mg/dL (ref 8–23)
CO2: 29 mmol/L (ref 22–32)
Calcium: 9 mg/dL (ref 8.9–10.3)
Chloride: 99 mmol/L (ref 98–111)
Creatinine, Ser: 0.98 mg/dL (ref 0.61–1.24)
GFR, Estimated: >60 mL/min (ref >60)
Glucose, Bld: 78 mg/dL (ref 70–99)
Potassium: 3.5 mmol/L (ref 3.5–5.1)
Sodium: 140 mmol/L (ref 135–145)

## 2020-05-04 LAB — TSH: TSH: 4.248 u[IU]/mL (ref 0.350–4.500)

## 2020-05-04 LAB — BRAIN NATRIURETIC PEPTIDE: B Natriuretic Peptide: 521 pg/mL — ABNORMAL HIGH (ref 0.0–100.0)

## 2020-05-08 LAB — ALPHA-1 ANTITRYPSIN PHENOTYPE: A-1 Antitrypsin, Ser: 170 mg/dL (ref 101–187)

## 2020-05-10 LAB — IGE: IgE (Immunoglobulin E), Serum: 44 IU/mL (ref 6–495)

## 2020-05-12 ENCOUNTER — Other Ambulatory Visit: Payer: Self-pay

## 2020-05-12 ENCOUNTER — Encounter: Payer: Self-pay | Admitting: Internal Medicine

## 2020-05-12 ENCOUNTER — Ambulatory Visit: Payer: Medicare HMO | Admitting: Internal Medicine

## 2020-05-12 DIAGNOSIS — R0600 Dyspnea, unspecified: Secondary | ICD-10-CM

## 2020-05-12 DIAGNOSIS — J449 Chronic obstructive pulmonary disease, unspecified: Secondary | ICD-10-CM

## 2020-05-12 DIAGNOSIS — R0609 Other forms of dyspnea: Secondary | ICD-10-CM

## 2020-05-12 DIAGNOSIS — R6889 Other general symptoms and signs: Secondary | ICD-10-CM | POA: Diagnosis not present

## 2020-05-12 DIAGNOSIS — J9611 Chronic respiratory failure with hypoxia: Secondary | ICD-10-CM

## 2020-05-12 NOTE — Assessment & Plan Note (Signed)
Assoc with hbp and bnp > 500 so rec Echo 05/12/2020 >>>   Advised needs to monitor his own bp and f/u with PCP for > 140/85   Each maintenance medication was reviewed in detail including emphasizing most importantly the difference between maintenance and prns and under what circumstances the prns are to be triggered using an action plan format where appropriate.  Total time for H and P, chart review, counseling,  directly observing portions of ambulatory 02 saturation study/ and generating customized AVS unique to this office visit / same day charting > 30 min

## 2020-05-12 NOTE — Assessment & Plan Note (Signed)
As of 04/03/2020  =  3.5 lpm hs and prn daytime -  04/26/2020   Walked 4lpm   approx   200 ft  @ slow pace  stopped due to sob with lowest sats 93%    -  05/12/2020   Walked RA  approx   450 ft  @ slow pace  stopped due to  Sob at 150 with sats down to 80% corrected to 95% on 2lpm    Advised no need for 02 at rest or likely room to room at home but needes to remember : Make sure you check your oxygen saturation  at your highest level of activity  to be sure it stays over 90% and adjust  02 flow upward to maintain this level if needed but remember to turn it back to previous settings when you stop (to conserve your supply).

## 2020-05-12 NOTE — Assessment & Plan Note (Signed)
MM/Active smoker > says quit 12/2019  - 04/30/2016   try symbicort 160 2bid  - 04/03/2020  After extensive coaching inhaler device,  effectiveness =    90% try breztri 2 every 12 hours x 4 weeks samples  And f/u in [name] ordered 04/26/2020  :   alpha one AT phenotype   MM  Level 170   04/26/2020   Allergy profile >  Eos 0.2 /  IgE 44       Group D in terms of symptom/risk and laba/lama/ICS  therefore appropriate rx at this point >>>  breztri best option though says he has "lots of unused symbicort 160" so either one is preferable to neither and just needs to use saba and 02 with ex more wisely:  I spent extra time with pt today reviewing appropriate use of albuterol for prn use on exertion with the following points: 1) saba is for relief of sob that does not improve by walking a slower pace or resting but rather if the pt does not improve after trying this first. 2) If the pt is convinced, as many are, that saba helps recover from activity faster then it's easy to tell if this is the case by re-challenging : ie stop, take the inhaler, then p 5 minutes try the exact same activity (intensity of workload) that just caused the symptoms and see if they are substantially diminished or not after saba 3) if there is an activity that reproducibly causes the symptoms, try the saba 15 min before the activity on alternate days   If in fact the saba really does help, then fine to continue to use it prn but advised may need to look closer at the maintenance regimen being used to achieve better control of airways disease with exertion.

## 2020-05-12 NOTE — Progress Notes (Signed)
Subjective:     Patient ID: [name], male   DOB: 10/26/1950    MRN: [id number]    Brief patient profile: 24 yowm MM/reports quit smoking 12/2019 with h/o IHD then sob both with exertion and at rest and noct x summer 2016 eval by Dr Annamaria Boots in 11/14/14 rec anoro and temporarily improved but says could not get it refilled and since then just using saba in multiple forms so referred to pulmonary clinic 04/30/2016 by Dr   Earle Gell and unable to perform spirometry    History of Present Illness  04/30/2016 1st [facility] office visit/ Jeny Nield on saba only   Chief Complaint  Patient presents with  . Pulmonary Consult    Referred by Dr. Earle Gell for eval of COPD.  Pt states he was dxed with COPD approx 1 yr ago. He has been on o2 24/7 since July 2018.  He is using neb with albuterol and ventolin inhaler approx 3 x per day. He states that he feels SOB with exertion such as walking to the mailbox. He is sometimes SOB just sitting and occ wakes up with SOB.    Lives in [place]:   MB is  downhill and has to stop, uses inhaler and no problem at all walking back to house s 02  Never tried to  Use saba  before exertion/ last used one hour prior to OV  sob  can occur s warning at rest even if not assoc cough or cp rec  Plan A = Automatic = symbicort 160 Take 2 puffs first thing in am and then another 2 puffs about 12 hours later.  Work on inhaler technique:  relax and gently blow all the way out then take a nice smooth deep breath back in, triggering the inhaler at same time you start breathing in.  Hold for up to 5 seconds if you can. Blow out thru nose. Rinse and gargle with water when done Plan B = Backup Only use your albuterol (VENTOLIN)  as a rescue medication Plan C = Crisis - only use your albuterol nebulizer if you first try Plan B and it fails to help > ok to use the nebulizer up to every 4 hours but if start needing it regularly call for immediate appointment The key is to stop  smoking completely before smoking completely stops you!    10/11/2016  f/u ov/Terrin Meddaugh re:  Copd / 02 dep with actiivty 4lpm and sleep 3lpm  And symb 160 2bid / still smoking Chief Complaint  Patient presents with  . Follow-up    He had stopped smoking for approx 1 month, and then started back due to stress after spouse had a stroke. He has a cough but no sputum production. He states his chest "burns". He has been having increased DOE over the past month- winded walking just from room to room.    saba once a day 3-6pm p am symb 6 am and hfa suboptimal and still smoking  Lots of rattling congestion but not able to bring up mucus/ sleeps ok on 3lpm / chest discomfort diffuse/ ant with coughing fits  Doe = MMRC3 = can't walk 100 yards even at a slow pace at a flat grade s stopping due to sob   rec Cough / congestion > mucinex or mucinex dm up to 1200 mg every 12 hours as needed  Monitor 02 sats with exertion and adjust to keep it over 90% Work on inhaler technique:  Please  remember to go to the  x-ray department downstairs in the basement  for your tests - we will call you with the results when they are available. The key is to stop smoking completely before smoking completely stops you!     Please schedule a follow up office visit in 6 weeks, call sooner if needed with pfts > did not do     04/03/2020  f/u ov/Cinda Hara re: copd / on trelegy  Chief Complaint  Patient presents with  . Consult    SOB with activity  Dyspnea:  Doe across the room x 4-5 months  Cough: rattle esp in am  but never comes up/ nasal drainage is clear  Sleeping: "23 degrees" on electric bed setting/  one pillow = baseline SABA use: one a week "can't do without"  02:  3.5 hs and prn during the day  Covid status:   Never shots/ never infected  rec The key is to stop smoking completely before smoking completely stops you! Plan A = Automatic = Always=    Breztri Take 2 puffs first thing in am and then another 2 puffs about 12  hours later.  Work on inhaler technique: Plan B = Backup (to supplement plan A, not to replace it) Only use your albuterol inhaler as a rescue medication Plan C = Crisis (instead of Plan B but only if Plan B stops working) - only use your albuterol nebulizer if you first try Plan B and it fails to help  Prednisone 10 mg take  4 each am x 2 days,   2 each am x 2 days,  1 each am x 2 days and stop  Make sure you check your oxygen saturation  at your highest level of activity  Please schedule a follow up office visit in  3 weeks, sooner if needed - bring all medications to the [place] office     04/26/2020  f/u ov/[place] office/Taichi Repka re: copd/ over use of saba says quit smoking did not bring in the breztri / neb but did bring in saba  Chief Complaint  Patient presents with  . Follow-up    Breathing has been worse over the past 2-3 wks. He is getting winded walking across the room. He is using his albuterol inhaler at least 3 x per day and has not used neb.   Dyspnea:  Mailbox is "89 steps"  slt downhill to it/ stops half way on 02  Cough: some rattle  Sleeping: 23 degrees  SABA use: very poor insight  02: 3.5 does not adjust  Covid status: ? J and J  Lung cancer screening: n/a  rec Plan A = Automatic = Always=    Breztri Take 2 puffs first thing in am and then another 2 puffs about 12 hours later.  Work on inhaler technique:  relax and gently blow all the way out then take a nice smooth deep breath back in, triggering the inhaler at same time you start breathing in.  Hold for up to 5 seconds if you can. Blow out thru nose. Rinse and gargle with water when done Plan B = Backup (to supplement plan A, not to replace it) Only use your albuterol inhaler as a rescue medication  Plan C = Crisis (instead of Plan B but only if Plan B stops working) - only use your albuterol nebulizer if you first try Plan B and it fails to help > ok to use the nebulizer up to every 4 hours but  if start needing  it regularly call for immediate appointment Also ok to Try albuterol 15 min before an activity (one day the inhaler, then next the nebulizer, then next nothing)  that you know would make you short of breath Make sure you check your oxygen saturation  at your highest level of activity. Please schedule a follow up office visit in 2 weeks and bring all inhalers and solutions. Add: says has half  A breztri still at home so given 2 samples to do counting on return    Labs ok x bnp 521     05/12/2020  f/u ov/[place] office/Modest Draeger re: copd / quit smoking Dec 2021  Chief Complaint  Patient presents with  . Follow-up    Shortness of breath with activity which patient states is the same as before  Dyspnea:  Has learned to pace him self  Cough: rarely  Sleeping: elevated 25 degrees/ rarely having noct symptoms SABA use: maybe every 6 hours including 1st thing in am  02: 3.5 lpm hs. None needed at rest, confused about how to use amb 02  Covid status: never vaccinated and doesn't plan to      No obvious day to day or daytime variability or assoc excess/ purulent sputum or mucus plugs or hemoptysis or cp or chest tightness, subjective wheeze or overt sinus or hb symptoms.   Sleeping as above without nocturnal  or early am exacerbation  of respiratory  c/o's or need for noct saba. Also denies any obvious fluctuation of symptoms with weather or environmental changes or other aggravating or alleviating factors except as outlined above   No unusual exposure hx or h/o childhood pna/ asthma or knowledge of premature birth.  Current Allergies, Complete Past Medical History, Past Surgical History, Family History, and Social History were reviewed in [employer] record.  ROS  The following are not active complaints unless bolded Hoarseness, sore throat, dysphagia, dental problems, itching, sneezing,  nasal congestion or discharge of excess mucus or purulent secretions, ear ache,    fever, chills, sweats, unintended wt loss or wt gain, classically pleuritic or exertional cp,  orthopnea pnd or arm/hand swelling  or leg swelling, presyncope, palpitations, abdominal pain, anorexia, nausea, vomiting, diarrhea  or change in bowel habits or change in bladder habits, change in stools or change in urine, dysuria, hematuria,  rash, arthralgias, visual complaints, headache, numbness, weakness or ataxia or problems with walking or coordination,  change in mood or  memory.        Current Meds  Medication Sig  . acetaminophen (TYLENOL) 500 MG tablet Take 500 mg by mouth every 6 (six) hours as needed for mild pain or headache.  . albuterol (PROAIR HFA) 108 (90 Base) MCG/ACT inhaler 2 puffs every 4 hours as needed only  if your can't catch your breath  . albuterol (PROVENTIL) (2.5 MG/3ML) 0.083% nebulizer solution Take 3 mLs (2.5 mg total) by nebulization every 6 (six) hours as needed for wheezing or shortness of breath.  . ALPRAZolam (XANAX) 0.5 MG tablet Take 0.5 mg by mouth 2 (two) times daily as needed for anxiety.   Marland Kitchen aspirin EC 81 MG tablet Take 1 tablet (81 mg total) by mouth daily.  Marland Kitchen atorvastatin (LIPITOR) 40 MG tablet Take 1 tablet (40 mg total) by mouth daily.  . Budeson-Glycopyrrol-Formoterol (BREZTRI AEROSPHERE) 160-9-4.8 MCG/ACT AERO Inhale 2 puffs into the lungs 2 (two) times daily.  . busPIRone (BUSPAR) 7.5 MG tablet Take 1 tablet (7.5 mg total) by  mouth 3 (three) times daily.  . clopidogrel (PLAVIX) 75 MG tablet TAKE ONE TABLET BY MOUTH DAILY  . escitalopram (LEXAPRO) 5 MG tablet Take 5 mg by mouth daily.  . feeding supplement, ENSURE ENLIVE, (ENSURE ENLIVE) LIQD Take 237 mLs by mouth 2 (two) times daily between meals.  . gabapentin (NEURONTIN) 300 MG capsule Take 300 mg by mouth 3 (three) times daily.  . isosorbide mononitrate (IMDUR) 30 MG 24 hr tablet Take 1 tablet (30 mg total) by mouth daily.  Marland Kitchen levothyroxine (SYNTHROID, LEVOTHROID) 50 MCG tablet Take 50 mcg by mouth  daily.  . metoprolol tartrate (LOPRESSOR) 25 MG tablet Take 1 tablet (25 mg total) by mouth 3 (three) times daily.  . nitroGLYCERIN (NITROSTAT) 0.4 MG SL tablet Place 1 tablet (0.4 mg total) under the tongue every 5 (five) minutes as needed for chest pain.  . OXYGEN Inhale 3.5 L into the lungs daily as needed (for shortness of breath). 3-3.5lpm 24/7  . pantoprazole (PROTONIX) 40 MG tablet Take 1 tablet (40 mg total) by mouth at bedtime.  . traMADol (ULTRAM) 50 MG tablet Take 2 tablets (100 mg total) by mouth every 8 (eight) hours as needed for severe pain.  . traZODone (DESYREL) 100 MG tablet Take 1 tablet (100 mg total) by mouth at bedtime as needed for sleep.                      Objective:   Physical Exam   05/12/2020        170  04/26/2020         173 04/03/2020        173 10/11/2016       144   04/30/16 143 lb (64.9 kg)  11/17/15 142 lb 10.2 oz (64.7 kg)  08/03/15 150 lb (68 kg)  Vital signs reviewed  05/12/2020  - Note at rest 02 sats  100% on RA   General appearance:    Chronically ill amb wm > stated age    Reports:   edentulous    HEENT : pt wearing mask not removed for exam due to covid -19 concerns.    NECK :  without JVD/Nodes/TM/ nl carotid upstrokes bilaterally   LUNGS: no acc muscle use,  Mod barrel  contour chest wall with bilateral  Distant bs s audible wheeze and  without cough on insp or exp maneuvers and mod  Hyperresonant  to  percussion bilaterally     CV:  RRR  no s3 or murmur or increase in P2, and no edema   ABD:  soft and nontender with pos mid insp Hoover's  in the supine position. No bruits or organomegaly appreciated, bowel sounds nl  MS:     ext warm without deformities, calf tenderness, cyanosis or clubbing No obvious joint restrictions   SKIN: warm and dry without lesions    NEURO:  alert, approp, nl sensorium with  no motor or cerebellar deficits apparent.                   Assessment:

## 2020-05-12 NOTE — Patient Instructions (Addendum)
Plan A = Automatic = Always=    Bretri (or symbicort 160)  Take 2 puffs first thing in am and then another 2 puffs about 12 hours later.   Plan B = Backup (to supplement plan A, not to replace it) Only use your albuterol inhaler as a rescue medication to be used if you can't catch your breath by resting or doing a relaxed purse lip breathing pattern.  - The less you use it, the better it will work when you need it. - Ok to use the inhaler up to 2 puffs  every 4 hours if you must but call for appointment if use goes up over your usual need - Don't leave home without it !!  (think of it like the spare tire for your car)   Plan C = Crisis (instead of Plan B but only if Plan B stops working) - only use your albuterol nebulizer if you first try Plan B and it fails to help > ok to use the nebulizer up to every 4 hours but if start needing it regularly call for immediate appointment  Make sure you check your oxygen saturation  at your highest level of activity  to be sure it stays over 90% and adjust  02 flow upward to maintain this level if needed but remember to turn it back to previous settings when you stop (to conserve your supply).   You will need to see your primary medical doctor if your blood pressure runs about 140/85 on your medications Please schedule a follow up visit in 3 months but call sooner if needed   Add Needs echo to complete w/u

## 2020-05-15 ENCOUNTER — Encounter: Payer: Self-pay | Admitting: Internal Medicine

## 2020-05-15 NOTE — Progress Notes (Signed)
Letter mailed

## 2020-05-21 ENCOUNTER — Other Ambulatory Visit: Payer: Self-pay | Admitting: Cardiology

## 2020-06-08 ENCOUNTER — Ambulatory Visit (HOSPITAL_COMMUNITY)
Admission: RE | Admit: 2020-06-08 | Discharge: 2020-06-08 | Disposition: A | Discharge status: Home / Self Care | Payer: Medicare HMO | Source: Ambulatory Visit | Attending: Internal Medicine | Admitting: Internal Medicine

## 2020-06-08 ENCOUNTER — Other Ambulatory Visit: Payer: Self-pay

## 2020-06-08 DIAGNOSIS — R0609 Other forms of dyspnea: Secondary | ICD-10-CM

## 2020-06-08 DIAGNOSIS — R0600 Dyspnea, unspecified: Secondary | ICD-10-CM | POA: Diagnosis not present

## 2020-06-08 LAB — ECHOCARDIOGRAM COMPLETE
AR max vel: 2.28 cm2
AV Area VTI: 2.1 cm2
AV Area mean vel: 2.11 cm2
AV Mean grad: 4.7 mmHg
AV Peak grad: 8.7 mmHg
Ao pk vel: 1.48 m/s
Area-P 1/2: 3.31 cm2
S' Lateral: 3.5 cm

## 2020-06-08 NOTE — Progress Notes (Signed)
*  PRELIMINARY RESULTS* Echocardiogram 2D Echocardiogram has been performed.  [name] 06/08/2020, 3:13 PM

## 2020-06-09 DIAGNOSIS — E039 Hypothyroidism, unspecified: Secondary | ICD-10-CM | POA: Diagnosis not present

## 2020-06-09 DIAGNOSIS — E7800 Pure hypercholesterolemia, unspecified: Secondary | ICD-10-CM | POA: Diagnosis not present

## 2020-06-09 DIAGNOSIS — I25810 Atherosclerosis of coronary artery bypass graft(s) without angina pectoris: Secondary | ICD-10-CM | POA: Diagnosis not present

## 2020-06-09 DIAGNOSIS — G8929 Other chronic pain: Secondary | ICD-10-CM | POA: Diagnosis not present

## 2020-06-09 DIAGNOSIS — I10 Essential (primary) hypertension: Secondary | ICD-10-CM | POA: Diagnosis not present

## 2020-06-09 DIAGNOSIS — D649 Anemia, unspecified: Secondary | ICD-10-CM | POA: Diagnosis not present

## 2020-06-09 DIAGNOSIS — J449 Chronic obstructive pulmonary disease, unspecified: Secondary | ICD-10-CM | POA: Diagnosis not present

## 2020-06-09 DIAGNOSIS — J441 Chronic obstructive pulmonary disease with (acute) exacerbation: Secondary | ICD-10-CM | POA: Diagnosis not present

## 2020-06-09 DIAGNOSIS — F329 Major depressive disorder, single episode, unspecified: Secondary | ICD-10-CM | POA: Diagnosis not present

## 2020-06-12 ENCOUNTER — Encounter: Payer: Self-pay | Admitting: *Deleted

## 2020-06-18 ENCOUNTER — Other Ambulatory Visit: Payer: Self-pay | Admitting: Cardiology

## 2020-06-26 ENCOUNTER — Telehealth: Payer: Self-pay | Admitting: Cardiology

## 2020-06-26 NOTE — Telephone Encounter (Signed)
Patient was told by his Pulmonologist Dr. Melvyn Novas to schedule an appt with [name] regarding his Echo results. Patient wanted to reach out to [name] to see if an appt with him was necessary. Please advise

## 2020-06-26 NOTE — Telephone Encounter (Signed)
Pt had 2 D Echo 06/08/2020 ordered by Dr Melvyn Novas.  Advised pt Dr Marlou Porch has not reviewed this results as he was unaware the testing has occurred.  Advised I will have Dr Marlou Porch review and call back with his recommendations.  According to current documentation from last OV - pt is due back to see Dr Marlou Porch in August 2022.

## 2020-06-27 NOTE — Telephone Encounter (Signed)
Reviewed results with pt who states understanding. He had no further questions or concerns at this time.  He is aware he is due for f/u with Dr Marlou Porch in August but did not schedule f/u at this time.

## 2020-06-27 NOTE — Telephone Encounter (Signed)
Left message for pt to c/b to discuss. 

## 2020-06-27 NOTE — Telephone Encounter (Signed)
Thank you, reviewed echocardiogram.  Pump function is stable and normal.  Overall reassuring echocardiogram.  There is some stiffness upon relaxation of the heart.  Continue with exercise.  Candee Furbish, MD

## 2020-06-27 NOTE — Telephone Encounter (Signed)
Pt is returning call.  

## 2020-07-04 DIAGNOSIS — F322 Major depressive disorder, single episode, severe without psychotic features: Secondary | ICD-10-CM | POA: Diagnosis not present

## 2020-07-04 DIAGNOSIS — I10 Essential (primary) hypertension: Secondary | ICD-10-CM | POA: Diagnosis not present

## 2020-07-04 DIAGNOSIS — I214 Non-ST elevation (NSTEMI) myocardial infarction: Secondary | ICD-10-CM | POA: Diagnosis not present

## 2020-07-04 DIAGNOSIS — D649 Anemia, unspecified: Secondary | ICD-10-CM | POA: Diagnosis not present

## 2020-07-04 DIAGNOSIS — E7800 Pure hypercholesterolemia, unspecified: Secondary | ICD-10-CM | POA: Diagnosis not present

## 2020-07-04 DIAGNOSIS — G8929 Other chronic pain: Secondary | ICD-10-CM | POA: Diagnosis not present

## 2020-07-04 DIAGNOSIS — J441 Chronic obstructive pulmonary disease with (acute) exacerbation: Secondary | ICD-10-CM | POA: Diagnosis not present

## 2020-07-04 DIAGNOSIS — E039 Hypothyroidism, unspecified: Secondary | ICD-10-CM | POA: Diagnosis not present

## 2020-07-04 DIAGNOSIS — J449 Chronic obstructive pulmonary disease, unspecified: Secondary | ICD-10-CM | POA: Diagnosis not present

## 2020-07-26 DIAGNOSIS — E7800 Pure hypercholesterolemia, unspecified: Secondary | ICD-10-CM | POA: Diagnosis not present

## 2020-07-26 DIAGNOSIS — I10 Essential (primary) hypertension: Secondary | ICD-10-CM | POA: Diagnosis not present

## 2020-07-26 DIAGNOSIS — I25810 Atherosclerosis of coronary artery bypass graft(s) without angina pectoris: Secondary | ICD-10-CM | POA: Diagnosis not present

## 2020-07-26 DIAGNOSIS — E039 Hypothyroidism, unspecified: Secondary | ICD-10-CM | POA: Diagnosis not present

## 2020-07-26 DIAGNOSIS — I214 Non-ST elevation (NSTEMI) myocardial infarction: Secondary | ICD-10-CM | POA: Diagnosis not present

## 2020-07-26 DIAGNOSIS — D649 Anemia, unspecified: Secondary | ICD-10-CM | POA: Diagnosis not present

## 2020-07-26 DIAGNOSIS — G8929 Other chronic pain: Secondary | ICD-10-CM | POA: Diagnosis not present

## 2020-07-26 DIAGNOSIS — F322 Major depressive disorder, single episode, severe without psychotic features: Secondary | ICD-10-CM | POA: Diagnosis not present

## 2020-07-26 DIAGNOSIS — J449 Chronic obstructive pulmonary disease, unspecified: Secondary | ICD-10-CM | POA: Diagnosis not present

## 2020-07-27 ENCOUNTER — Other Ambulatory Visit: Payer: Self-pay

## 2020-07-27 NOTE — Patient Outreach (Signed)
[facility]) Care Management  07/27/2020  TAVARUS POTEETE 01/04/1951 [id number]   Telephone Assessment Quarterly Call     Unsuccessful quarterly outreach attempt to patient-busy signal.     Plan: RN CM will make quarterly outreach attempt to patient within the month of  Oct.       Day Greb Verl Blalock [place] Management Telephonic Care Management Coordinator Direct Phone: [phone] Toll Free: [phone] Fax: [phone]

## 2020-08-22 ENCOUNTER — Other Ambulatory Visit: Payer: Self-pay | Admitting: *Deleted

## 2020-08-22 DIAGNOSIS — I714 Abdominal aortic aneurysm, without rupture, unspecified: Secondary | ICD-10-CM

## 2020-08-22 DIAGNOSIS — I7101 Dissection of thoracic aorta: Secondary | ICD-10-CM

## 2020-08-22 DIAGNOSIS — I71019 Dissection of thoracic aorta, unspecified: Secondary | ICD-10-CM

## 2020-08-23 DIAGNOSIS — D649 Anemia, unspecified: Secondary | ICD-10-CM | POA: Diagnosis not present

## 2020-08-23 DIAGNOSIS — J441 Chronic obstructive pulmonary disease with (acute) exacerbation: Secondary | ICD-10-CM | POA: Diagnosis not present

## 2020-08-23 DIAGNOSIS — G8929 Other chronic pain: Secondary | ICD-10-CM | POA: Diagnosis not present

## 2020-08-23 DIAGNOSIS — I10 Essential (primary) hypertension: Secondary | ICD-10-CM | POA: Diagnosis not present

## 2020-08-23 DIAGNOSIS — J449 Chronic obstructive pulmonary disease, unspecified: Secondary | ICD-10-CM | POA: Diagnosis not present

## 2020-08-23 DIAGNOSIS — K219 Gastro-esophageal reflux disease without esophagitis: Secondary | ICD-10-CM | POA: Diagnosis not present

## 2020-08-23 DIAGNOSIS — F329 Major depressive disorder, single episode, unspecified: Secondary | ICD-10-CM | POA: Diagnosis not present

## 2020-08-23 DIAGNOSIS — E7800 Pure hypercholesterolemia, unspecified: Secondary | ICD-10-CM | POA: Diagnosis not present

## 2020-08-23 DIAGNOSIS — E039 Hypothyroidism, unspecified: Secondary | ICD-10-CM | POA: Diagnosis not present

## 2020-09-19 DIAGNOSIS — Z125 Encounter for screening for malignant neoplasm of prostate: Secondary | ICD-10-CM | POA: Diagnosis not present

## 2020-09-19 DIAGNOSIS — Z1389 Encounter for screening for other disorder: Secondary | ICD-10-CM | POA: Diagnosis not present

## 2020-09-19 DIAGNOSIS — E039 Hypothyroidism, unspecified: Secondary | ICD-10-CM | POA: Diagnosis not present

## 2020-09-19 DIAGNOSIS — F411 Generalized anxiety disorder: Secondary | ICD-10-CM | POA: Diagnosis not present

## 2020-09-19 DIAGNOSIS — J449 Chronic obstructive pulmonary disease, unspecified: Secondary | ICD-10-CM | POA: Diagnosis not present

## 2020-09-19 DIAGNOSIS — I25810 Atherosclerosis of coronary artery bypass graft(s) without angina pectoris: Secondary | ICD-10-CM | POA: Diagnosis not present

## 2020-09-19 DIAGNOSIS — Z0000 Encounter for general adult medical examination without abnormal findings: Secondary | ICD-10-CM | POA: Diagnosis not present

## 2020-09-19 DIAGNOSIS — K219 Gastro-esophageal reflux disease without esophagitis: Secondary | ICD-10-CM | POA: Diagnosis not present

## 2020-09-19 DIAGNOSIS — I10 Essential (primary) hypertension: Secondary | ICD-10-CM | POA: Diagnosis not present

## 2020-09-19 DIAGNOSIS — J9611 Chronic respiratory failure with hypoxia: Secondary | ICD-10-CM | POA: Diagnosis not present

## 2020-09-19 DIAGNOSIS — M48061 Spinal stenosis, lumbar region without neurogenic claudication: Secondary | ICD-10-CM | POA: Diagnosis not present

## 2020-10-17 ENCOUNTER — Inpatient Hospital Stay: Admission: RE | Admit: 2020-10-17 | Payer: Medicare HMO | Source: Ambulatory Visit

## 2020-10-17 ENCOUNTER — Encounter: Payer: Medicare HMO | Admitting: Thoracic Surgery (Cardiothoracic Vascular Surgery)

## 2020-10-17 ENCOUNTER — Other Ambulatory Visit: Payer: Medicare HMO

## 2020-10-20 DIAGNOSIS — E7800 Pure hypercholesterolemia, unspecified: Secondary | ICD-10-CM | POA: Diagnosis not present

## 2020-10-20 DIAGNOSIS — J449 Chronic obstructive pulmonary disease, unspecified: Secondary | ICD-10-CM | POA: Diagnosis not present

## 2020-10-20 DIAGNOSIS — J441 Chronic obstructive pulmonary disease with (acute) exacerbation: Secondary | ICD-10-CM | POA: Diagnosis not present

## 2020-10-20 DIAGNOSIS — G8929 Other chronic pain: Secondary | ICD-10-CM | POA: Diagnosis not present

## 2020-10-20 DIAGNOSIS — D649 Anemia, unspecified: Secondary | ICD-10-CM | POA: Diagnosis not present

## 2020-10-20 DIAGNOSIS — I25810 Atherosclerosis of coronary artery bypass graft(s) without angina pectoris: Secondary | ICD-10-CM | POA: Diagnosis not present

## 2020-10-20 DIAGNOSIS — I10 Essential (primary) hypertension: Secondary | ICD-10-CM | POA: Diagnosis not present

## 2020-10-20 DIAGNOSIS — F331 Major depressive disorder, recurrent, moderate: Secondary | ICD-10-CM | POA: Diagnosis not present

## 2020-10-20 DIAGNOSIS — E039 Hypothyroidism, unspecified: Secondary | ICD-10-CM | POA: Diagnosis not present

## 2020-10-26 ENCOUNTER — Other Ambulatory Visit: Payer: Self-pay

## 2020-10-26 NOTE — Patient Outreach (Signed)
[facility]) Care Management  10/26/2020  THERMON ZULAUF 10-12-1950 [id number]   Telephone Assessment   Unsuccessful outreach attempt patient. Number currently not in service.    Plan: RN CM will make outreach attempt within the month of Jan.    Azha Constantin Verl Blalock [place] Management Telephonic Care Management Coordinator Direct Phone: [phone] Toll Free: [phone] Fax: [phone]

## 2020-10-27 ENCOUNTER — Ambulatory Visit: Payer: Self-pay

## 2020-10-30 ENCOUNTER — Ambulatory Visit: Payer: Medicare HMO | Admitting: Internal Medicine

## 2020-10-30 NOTE — Progress Notes (Deleted)
Subjective:     Patient ID: [name], male   DOB: 15-May-1950    MRN: [id number]    Brief patient profile: 79 yowm MM/reports quit smoking 12/2019 with h/o IHD then sob both with exertion and at rest and noct x summer 2016 eval by Dr Annamaria Boots in 11/14/14 rec anoro and temporarily improved but says could not get it refilled and since then just using saba in multiple forms so referred to pulmonary clinic 04/30/2016 by Dr   [name] and unable to perform spirometry    History of Present Illness  04/30/2016 1st [facility] office visit/ [name] on saba only   Chief Complaint  Patient presents with   Pulmonary Consult    Referred by Dr. Earle [name] for eval of COPD.  Pt states he was dxed with COPD approx 1 yr ago. He has been on o2 24/7 since July 2018.  He is using neb with albuterol and ventolin inhaler approx 3 x per day. He states that he feels SOB with exertion such as walking to the mailbox. He is sometimes SOB just sitting and occ wakes up with SOB.    Lives in [place]:   MB is  downhill and has to stop, uses inhaler and no problem at all walking back to house s 02  Never tried to  Use saba  before exertion/ last used one hour prior to OV  sob  can occur s warning at rest even if not assoc cough or cp rec  Plan A = Automatic = symbicort 160 Take 2 puffs first thing in am and then another 2 puffs about 12 hours later.  Work on inhaler technique:  relax and gently blow all the way out then take a nice smooth deep breath back in, triggering the inhaler at same time you start breathing in.  Hold for up to 5 seconds if you can. Blow out thru nose. Rinse and gargle with water when done Plan B = Backup Only use your albuterol (VENTOLIN)  as a rescue medication Plan C = Crisis - only use your albuterol nebulizer if you first try Plan B and it fails to help > ok to use the nebulizer up to every 4 hours but if start needing it regularly call for immediate appointment The key is to stop smoking  completely before smoking completely stops you!    10/11/2016  f/u ov/[name] re:  Copd / 02 dep with actiivty 4lpm and sleep 3lpm  And symb 160 2bid / still smoking Chief Complaint  Patient presents with   Follow-up    He had stopped smoking for approx 1 month, and then started back due to stress after spouse had a stroke. He has a cough but no sputum production. He states his chest "burns". He has been having increased DOE over the past month- winded walking just from room to room.    saba once a day 3-6pm p am symb 6 am and hfa suboptimal and still smoking  Lots of rattling congestion but not able to bring up mucus/ sleeps ok on 3lpm / chest discomfort diffuse/ ant with coughing fits  Doe = MMRC3 = can't walk 100 yards even at a slow pace at a flat grade s stopping due to sob   rec Cough / congestion > mucinex or mucinex dm up to 1200 mg every 12 hours as needed  Monitor 02 sats with exertion and adjust to keep it over 90% Work on inhaler technique:  Please  remember to go to the  x-ray department downstairs in the basement  for your tests - we will call you with the results when they are available. The key is to stop smoking completely before smoking completely stops you!     Please schedule a follow up office visit in 6 weeks, call sooner if needed with pfts > did not do     04/03/2020  f/u ov/[name] re: copd / on trelegy  Chief Complaint  Patient presents with   Consult    SOB with activity  Dyspnea:  Doe across the room x 4-5 months  Cough: rattle esp in am  but never comes up/ nasal drainage is clear  Sleeping: "23 degrees" on electric bed setting/  one pillow = baseline SABA use: one a week "can't do without"  02:  3.5 hs and prn during the day  Covid status:   Never shots/ never infected  rec The key is to stop smoking completely before smoking completely stops you! Plan A = Automatic = Always=    Breztri Take 2 puffs first thing in am and then another 2 puffs about 12 hours  later.  Work on inhaler technique: Plan B = Backup (to supplement plan A, not to replace it) Only use your albuterol inhaler as a rescue medication Plan C = Crisis (instead of Plan B but only if Plan B stops working) - only use your albuterol nebulizer if you first try Plan B and it fails to help  Prednisone 10 mg take  4 each am x 2 days,   2 each am x 2 days,  1 each am x 2 days and stop  Make sure you check your oxygen saturation  at your highest level of activity  Please schedule a follow up office visit in  3 weeks, sooner if needed - bring all medications to the [place] office     04/26/2020  f/u ov/[place] office/[name] re: copd/ over use of saba says quit smoking did not bring in the breztri / neb but did bring in saba  Chief Complaint  Patient presents with   Follow-up    Breathing has been worse over the past 2-3 wks. He is getting winded walking across the room. He is using his albuterol inhaler at least 3 x per day and has not used neb.   Dyspnea:  Mailbox is "89 steps"  slt downhill to it/ stops half way on 02  Cough: some rattle  Sleeping: 23 degrees  SABA use: very poor insight  02: 3.5 does not adjust  Covid status: ? J and J  Lung cancer screening: n/a  rec Plan A = Automatic = Always=    Breztri Take 2 puffs first thing in am and then another 2 puffs about 12 hours later.  Work on inhaler technique:  relax and gently blow all the way out then take a nice smooth deep breath back in, triggering the inhaler at same time you start breathing in.  Hold for up to 5 seconds if you can. Blow out thru nose. Rinse and gargle with water when done Plan B = Backup (to supplement plan A, not to replace it) Only use your albuterol inhaler as a rescue medication  Plan C = Crisis (instead of Plan B but only if Plan B stops working) - only use your albuterol nebulizer if you first try Plan B and it fails to help > ok to use the nebulizer up to every 4 hours but  if start needing it  regularly call for immediate appointment Also ok to Try albuterol 15 min before an activity (one day the inhaler, then next the nebulizer, then next nothing)  that you know would make you short of breath Make sure you check your oxygen saturation  at your highest level of activity. Please schedule a follow up office visit in 2 weeks and bring all inhalers and solutions. Add: says has half  A breztri still at home so given 2 samples to do counting on return    Labs ok x bnp 521     05/12/2020  f/u ov/[place] office/[place] re: copd / quit smoking Dec 2021  Chief Complaint  Patient presents with   Follow-up    Shortness of breath with activity which patient states is the same as before  Dyspnea:  Has learned to pace him self  Cough: rarely  Sleeping: elevated 25 degrees/ rarely having noct symptoms SABA use: maybe every 6 hours including 1st thing in am  02: 3.5 lpm hs. None needed at rest, confused about how to use amb 02  Covid status: never vaccinated and doesn't plan to  Rec    10/30/2020  f/u ov/[place] office/Estevan Kersh re: copd ? / maint on  02 hs and prn ***  No chief complaint on file.   Dyspnea:  *** Cough: *** Sleeping: *** SABA use: *** 02: *** Covid status: *** Lung cancer screening: ***   No obvious day to day or daytime variability or assoc excess/ purulent sputum or mucus plugs or hemoptysis or cp or chest tightness, subjective wheeze or overt sinus or hb symptoms.   *** without nocturnal  or early am exacerbation  of respiratory  c/o's or need for noct saba. Also denies any obvious fluctuation of symptoms with weather or environmental changes or other aggravating or alleviating factors except as outlined above   No unusual exposure hx or h/o childhood pna/ asthma or knowledge of premature birth.  Current Allergies, Complete Past Medical History, Past Surgical History, Family History, and Social History were reviewed in [employer]  record.  ROS  The following are not active complaints unless bolded Hoarseness, sore throat, dysphagia, dental problems, itching, sneezing,  nasal congestion or discharge of excess mucus or purulent secretions, ear ache,   fever, chills, sweats, unintended wt loss or wt gain, classically pleuritic or exertional cp,  orthopnea pnd or arm/hand swelling  or leg swelling, presyncope, palpitations, abdominal pain, anorexia, nausea, vomiting, diarrhea  or change in bowel habits or change in bladder habits, change in stools or change in urine, dysuria, hematuria,  rash, arthralgias, visual complaints, headache, numbness, weakness or ataxia or problems with walking or coordination,  change in mood or  memory.        No outpatient medications have been marked as taking for the 10/30/20 encounter (Appointment) with Tanda Rockers, MD.                           Objective:   Physical Exam   10/30/2020       ***  05/12/2020        170 04/26/2020         173 04/03/2020        173 10/11/2016       144   04/30/16 143 lb (64.9 kg)  11/17/15 142 lb 10.2 oz (64.7 kg)  08/03/15 150 lb (68 kg)   Vital signs reviewed  10/30/2020  - Note at rest 02 sats  ***% on ***   General appearance:    ***     Reports:   edentulous     Mod bar***                  Assessment:

## 2020-10-31 ENCOUNTER — Encounter: Payer: Medicare HMO | Admitting: Thoracic Surgery (Cardiothoracic Vascular Surgery)

## 2020-10-31 ENCOUNTER — Inpatient Hospital Stay: Admission: RE | Admit: 2020-10-31 | Payer: Medicare HMO | Source: Ambulatory Visit

## 2020-11-14 ENCOUNTER — Encounter: Payer: Medicare HMO | Admitting: Thoracic Surgery (Cardiothoracic Vascular Surgery)

## 2020-11-14 ENCOUNTER — Inpatient Hospital Stay: Admission: RE | Admit: 2020-11-14 | Payer: Medicare HMO | Source: Ambulatory Visit

## 2020-11-20 DIAGNOSIS — F331 Major depressive disorder, recurrent, moderate: Secondary | ICD-10-CM | POA: Diagnosis not present

## 2020-11-20 DIAGNOSIS — J449 Chronic obstructive pulmonary disease, unspecified: Secondary | ICD-10-CM | POA: Diagnosis not present

## 2020-11-20 DIAGNOSIS — D649 Anemia, unspecified: Secondary | ICD-10-CM | POA: Diagnosis not present

## 2020-11-20 DIAGNOSIS — I10 Essential (primary) hypertension: Secondary | ICD-10-CM | POA: Diagnosis not present

## 2020-11-20 DIAGNOSIS — G8929 Other chronic pain: Secondary | ICD-10-CM | POA: Diagnosis not present

## 2020-11-20 DIAGNOSIS — E7800 Pure hypercholesterolemia, unspecified: Secondary | ICD-10-CM | POA: Diagnosis not present

## 2020-11-20 DIAGNOSIS — J441 Chronic obstructive pulmonary disease with (acute) exacerbation: Secondary | ICD-10-CM | POA: Diagnosis not present

## 2020-11-20 DIAGNOSIS — K219 Gastro-esophageal reflux disease without esophagitis: Secondary | ICD-10-CM | POA: Diagnosis not present

## 2020-12-15 ENCOUNTER — Ambulatory Visit (INDEPENDENT_AMBULATORY_CARE_PROVIDER_SITE_OTHER): Payer: Medicare HMO | Admitting: Internal Medicine

## 2020-12-15 ENCOUNTER — Encounter: Payer: Self-pay | Admitting: Internal Medicine

## 2020-12-15 ENCOUNTER — Other Ambulatory Visit: Payer: Self-pay

## 2020-12-15 DIAGNOSIS — J449 Chronic obstructive pulmonary disease, unspecified: Secondary | ICD-10-CM

## 2020-12-15 DIAGNOSIS — J9611 Chronic respiratory failure with hypoxia: Secondary | ICD-10-CM

## 2020-12-15 NOTE — Patient Instructions (Addendum)
Make sure you check your oxygen saturation  AT  your highest level of activity (not after you stop)   to be sure it stays over 90% and adjust  02 flow upward to maintain this level if needed but remember to turn it back to previous settings when you stop (to conserve your supply again with the goal of 90% or greater at all times)   We can refer to you to adapt for best fit for amb 02    Ok to try albuterol 15 min before an activity (on alternating days)  that you know would usually make you short of breath and see if it makes any difference and if makes none then don't take albuterol after activity unless you can't catch your breath as this means it's the resting that helps, not the albuterol.      Please schedule a follow up visit in 6 months  - PFTs on return

## 2020-12-15 NOTE — Assessment & Plan Note (Signed)
MM/Active smoker > says quit 12/2019  - 04/30/2016   try symbicort 160 2bid  - 04/03/2020  After extensive coaching inhaler device,  effectiveness =    90% try breztri 2 every 12 hours x 4 weeks samples  And f/u in [place] ordered 04/26/2020  :   alpha one AT phenotype   MM  Level 170   04/26/2020   Allergy profile >  Eos 0.2 /  IgE 44  - 12/15/2020  After extensive coaching inhaler device,  effectiveness =    90%    Group D in terms of symptom/risk and laba/lama/ICS  therefore appropriate rx at this point >>>  Continue breztri 2bid and approp saba  Re SABA :  I spent extra time with pt today reviewing appropriate use of albuterol for prn use on exertion with the following points: 1) saba is for relief of sob that does not improve by walking a slower pace or resting but rather if the pt does not improve after trying this first. 2) If the pt is convinced, as many are, that saba helps recover from activity faster then it's easy to tell if this is the case by re-challenging : ie stop, take the inhaler, then p 5 minutes try the exact same activity (intensity of workload) that just caused the symptoms and see if they are substantially diminished or not after saba 3) if there is an activity that reproducibly causes the symptoms, try the saba 15 min before the activity on alternate days   If in fact the saba really does help, then fine to continue to use it prn but advised may need to look closer at the maintenance regimen being used to achieve better control of airways disease with exertion.

## 2020-12-15 NOTE — Assessment & Plan Note (Signed)
As of 04/03/2020  =  3.5 lpm hs and prn daytime -  04/26/2020   Walked 4lpm   approx   200 ft  @ slow pace  stopped due to sob with lowest sats 93%    -  05/12/2020   Walked RA  approx   450 ft  @ slow pace  stopped due to  Sob at 150 with sats down to 80% corrected to 95% on 2lpm   - 12/15/2020 sats ok RA at rest > referred for POC trial - 12/15/2020   Walked on 3.5 cont  x  1  lap(s) =  approx 128ft @ slow pace, stopped due to back pain/sob with lowest 02 sats 98%    advised again: Make sure you check your oxygen saturation  AT  your highest level of activity (not after you stop)   to be sure it stays over 90% and adjust  02 flow upward to maintain this level if needed but remember to turn it back to previous settings when you stop (to conserve your supply).            Each maintenance medication was reviewed in detail including emphasizing most importantly the difference between maintenance and prns and under what circumstances the prns are to be triggered using an action plan format where appropriate.  Total time for H and P, chart review, counseling, reviewing hfa/ 02 device(s) , directly observing portions of ambulatory 02 saturation study/ and generating customized AVS unique to this office visit / same day charting > 30 min

## 2020-12-15 NOTE — Progress Notes (Signed)
Subjective:     Patient ID: [name], male   DOB: 15-May-1950    MRN: [id number]    Brief patient profile: 79 yowm MM/reports quit smoking 12/2019 with h/o IHD then sob both with exertion and at rest and noct x summer 2016 eval by Dr Annamaria Boots in 11/14/14 rec anoro and temporarily improved but says could not get it refilled and since then just using saba in multiple forms so referred to pulmonary clinic 04/30/2016 by Dr   Earle Gell and unable to perform spirometry    History of Present Illness  04/30/2016 1st [facility] office visit/ Aydn Ferrara on saba only   Chief Complaint  Patient presents with   Pulmonary Consult    Referred by Dr. Earle Gell for eval of COPD.  Pt states he was dxed with COPD approx 1 yr ago. He has been on o2 24/7 since July 2018.  He is using neb with albuterol and ventolin inhaler approx 3 x per day. He states that he feels SOB with exertion such as walking to the mailbox. He is sometimes SOB just sitting and occ wakes up with SOB.    Lives in [place]:   MB is  downhill and has to stop, uses inhaler and no problem at all walking back to house s 02  Never tried to  Use saba  before exertion/ last used one hour prior to OV  sob  can occur s warning at rest even if not assoc cough or cp rec  Plan A = Automatic = symbicort 160 Take 2 puffs first thing in am and then another 2 puffs about 12 hours later.  Work on inhaler technique:  relax and gently blow all the way out then take a nice smooth deep breath back in, triggering the inhaler at same time you start breathing in.  Hold for up to 5 seconds if you can. Blow out thru nose. Rinse and gargle with water when done Plan B = Backup Only use your albuterol (VENTOLIN)  as a rescue medication Plan C = Crisis - only use your albuterol nebulizer if you first try Plan B and it fails to help > ok to use the nebulizer up to every 4 hours but if start needing it regularly call for immediate appointment The key is to stop smoking  completely before smoking completely stops you!    10/11/2016  f/u ov/Kritika [facility]ukes re:  Copd / 02 dep with actiivty 4lpm and sleep 3lpm  And symb 160 2bid / still smoking Chief Complaint  Patient presents with   Follow-up    He had stopped smoking for approx 1 month, and then started back due to stress after spouse had a stroke. He has a cough but no sputum production. He states his chest "burns". He has been having increased DOE over the past month- winded walking just from room to room.    saba once a day 3-6pm p am symb 6 am and hfa suboptimal and still smoking  Lots of rattling congestion but not able to bring up mucus/ sleeps ok on 3lpm / chest discomfort diffuse/ ant with coughing fits  Doe = MMRC3 = can't walk 100 yards even at a slow pace at a flat grade s stopping due to sob   rec Cough / congestion > mucinex or mucinex dm up to 1200 mg every 12 hours as needed  Monitor 02 sats with exertion and adjust to keep it over 90% Work on inhaler technique:  Please  remember to go to the  x-ray department downstairs in the basement  for your tests - we will call you with the results when they are available. The key is to stop smoking completely before smoking completely stops you!     Please schedule a follow up office visit in 6 weeks, call sooner if needed with pfts > did not do     04/03/2020  f/u ov/Jasslyn Finkel re: copd / on trelegy  Chief Complaint  Patient presents with   Consult    SOB with activity  Dyspnea:  Doe across the room x 4-5 months  Cough: rattle esp in am  but never comes up/ nasal drainage is clear  Sleeping: "23 degrees" on electric bed setting/  one pillow = baseline SABA use: one a week "can't do without"  02:  3.5 hs and prn during the day  Covid status:   Never shots/ never infected  rec The key is to stop smoking completely before smoking completely stops you! Plan A = Automatic = Always=    Breztri Take 2 puffs first thing in am and then another 2 puffs about 12 hours  later.  Work on inhaler technique: Plan B = Backup (to supplement plan A, not to replace it) Only use your albuterol inhaler as a rescue medication Plan C = Crisis (instead of Plan B but only if Plan B stops working) - only use your albuterol nebulizer if you first try Plan B and it fails to help  Prednisone 10 mg take  4 each am x 2 days,   2 each am x 2 days,  1 each am x 2 days and stop  Make sure you check your oxygen saturation  at your highest level of activity  Please schedule a follow up office visit in  3 weeks, sooner if needed - bring all medications to the [place] office     04/26/2020  f/u ov/[place] office/Charelle Petrakis re: copd/ over use of saba says quit smoking did not bring in the breztri / neb but did bring in saba  Chief Complaint  Patient presents with   Follow-up    Breathing has been worse over the past 2-3 wks. He is getting winded walking across the room. He is using his albuterol inhaler at least 3 x per day and has not used neb.   Dyspnea:  Mailbox is "89 steps"  slt downhill to it/ stops half way on 02  Cough: some rattle  Sleeping: 23 degrees  SABA use: very poor insight  02: 3.5 does not adjust  Covid status: ? J and J  Lung cancer screening: n/a  rec Plan A = Automatic = Always=    Breztri Take 2 puffs first thing in am and then another 2 puffs about 12 hours later.  Work on inhaler technique:  relax and gently blow all the way out then take a nice smooth deep breath back in, triggering the inhaler at same time you start breathing in.  Hold for up to 5 seconds if you can. Blow out thru nose. Rinse and gargle with water when done Plan B = Backup (to supplement plan A, not to replace it) Only use your albuterol inhaler as a rescue medication  Plan C = Crisis (instead of Plan B but only if Plan B stops working) - only use your albuterol nebulizer if you first try Plan B and it fails to help > ok to use the nebulizer up to every 4 hours but  if start needing it  regularly call for immediate appointment Also ok to Try albuterol 15 min before an activity (one day the inhaler, then next the nebulizer, then next nothing)  that you know would make you short of breath Make sure you check your oxygen saturation  at your highest level of activity. Please schedule a follow up office visit in 2 weeks and bring all inhalers and solutions. Add: says has half  A breztri still at home so given 2 samples to do counting on return    Labs ok x bnp 521     05/12/2020  f/u ov/[place] office/Adil Tugwell re: copd / quit smoking Dec 2021  Chief Complaint  Patient presents with   Follow-up    Shortness of breath with activity which patient states is the same as before  Dyspnea:  Has learned to pace him self  Cough: rarely  Sleeping: elevated 25 degrees/ rarely having noct symptoms SABA use: maybe every 6 hours including 1st thing in am  02: 3.5 lpm hs. None needed at rest, confused about how to use amb 02  Covid status: never vaccinated and doesn't plan to  Rec Plan A = Automatic = Always=    Bretri (or symbicort 160)  Take 2 puffs first thing in am and then another 2 puffs about 12 hours later.  Plan B = Backup (to supplement plan A, not to replace it) Only use your albuterol inhaler as a rescue medication Plan C = Crisis (instead of Plan B but only if Plan B stops working) - only use your albuterol nebulizer if you first try Plan B  Make sure you check your oxygen saturation  at your highest level of activity  to be sure it stays over 90%  You will need to see your primary medical doctor if your blood pressure runs about 140/85 on your medications Please schedule a follow up visit in 3 months but call sooner if needed       12/15/2020  f/u ov/[place] office/Marin Wisner re: copd clinically, could not do pft,  maint on breztri  2bid  Chief Complaint  Patient presents with   Follow-up    SOB with exertion has worsened since last ov. Uses 3.5 lpm cont. With activity and  at rest.   Dyspnea:  mailbox and back uphill back to house/ around noon p breztri in am and uses saba when gets back to hourse  Cough: none  Sleeping: about 25 degrees and does fine  SABA use: 4 x daily hfa/  02: 3.5 lpm cont  Covid status: never vax  Lung cancer screening: doing serial CTa march yearly to f/u TA   No obvious day to day or daytime variability or assoc excess/ purulent sputum or mucus plugs or hemoptysis or cp or chest tightness, subjective wheeze or overt sinus or hb symptoms.   Sleeping  without nocturnal  or early am exacerbation  of respiratory  c/o's or need for noct saba. Also denies any obvious fluctuation of symptoms with weather or environmental changes or other aggravating or alleviating factors except as outlined above   No unusual exposure hx or h/o childhood pna/ asthma or knowledge of premature birth.  Current Allergies, Complete Past Medical History, Past Surgical History, Family History, and Social History were reviewed in [employer] record.  ROS  The following are not active complaints unless bolded Hoarseness, sore throat, dysphagia, dental problems, itching, sneezing,  nasal congestion or discharge of excess mucus or purulent secretions,  ear ache,   fever, chills, sweats, unintended wt loss or wt gain, classically pleuritic or exertional cp,  orthopnea pnd or arm/hand swelling  or leg swelling, presyncope, palpitations, abdominal pain, anorexia, nausea, vomiting, diarrhea  or change in bowel habits or change in bladder habits, change in stools or change in urine, dysuria, hematuria,  rash, arthralgias, visual complaints, headache, numbness, weakness or ataxia or problems with walking or coordination,  change in mood or  memory.        Current Meds  Medication Sig   acetaminophen (TYLENOL) 500 MG tablet Take 500 mg by mouth every 6 (six) hours as needed for mild pain or headache.   albuterol (PROAIR HFA) 108 (90 Base) MCG/ACT inhaler  2 puffs every 4 hours as needed only  if your can't catch your breath   albuterol (PROVENTIL) (2.5 MG/3ML) 0.083% nebulizer solution Take 3 mLs (2.5 mg total) by nebulization every 6 (six) hours as needed for wheezing or shortness of breath.   ALPRAZolam (XANAX) 0.5 MG tablet Take 0.5 mg by mouth 2 (two) times daily as needed for anxiety.    aspirin EC 81 MG tablet Take 1 tablet (81 mg total) by mouth daily.   atorvastatin (LIPITOR) 40 MG tablet Take 1 tablet (40 mg total) by mouth daily.   Budeson-Glycopyrrol-Formoterol (BREZTRI AEROSPHERE) 160-9-4.8 MCG/ACT AERO Inhale 2 puffs into the lungs 2 (two) times daily.   busPIRone (BUSPAR) 7.5 MG tablet Take 1 tablet (7.5 mg total) by mouth 3 (three) times daily.   clopidogrel (PLAVIX) 75 MG tablet TAKE ONE TABLET BY MOUTH ONCE DAILY   escitalopram (LEXAPRO) 5 MG tablet Take 5 mg by mouth daily.   feeding supplement, ENSURE ENLIVE, (ENSURE ENLIVE) LIQD Take 237 mLs by mouth 2 (two) times daily between meals.   gabapentin (NEURONTIN) 300 MG capsule Take 300 mg by mouth 3 (three) times daily.   isosorbide mononitrate (IMDUR) 30 MG 24 hr tablet TAKE ONE TABLET BY MOUTH ONCE DAILY   levothyroxine (SYNTHROID, LEVOTHROID) 50 MCG tablet Take 50 mcg by mouth daily.   metoprolol tartrate (LOPRESSOR) 25 MG tablet Take 1 tablet (25 mg total) by mouth 3 (three) times daily.   nitroGLYCERIN (NITROSTAT) 0.4 MG SL tablet Place 1 tablet (0.4 mg total) under the tongue every 5 (five) minutes as needed for chest pain.   OXYGEN Inhale 3.5 L into the lungs daily as needed (for shortness of breath). 3-3.5lpm 24/7   pantoprazole (PROTONIX) 40 MG tablet Take 1 tablet (40 mg total) by mouth at bedtime.   traMADol (ULTRAM) 50 MG tablet Take 2 tablets (100 mg total) by mouth every 8 (eight) hours as needed for severe pain.   traZODone (DESYREL) 100 MG tablet Take 1 tablet (100 mg total) by mouth at bedtime as needed for sleep.                       Objective:    Physical Exam   12/15/2020       171  05/12/2020        170 04/26/2020         173 04/03/2020        173 10/11/2016       144   04/30/16 143 lb (64.9 kg)  11/17/15 142 lb 10.2 oz (64.7 kg)  08/03/15 150 lb (68 kg)      Vital signs reviewed  12/15/2020  - Note at rest 02 sats  100% on 3.5 Pulsed and RA =  93%   General appearance:    amb wm nad/ a bit slow in responses ? Baseline ?    Reports:   edentulous   HEENT : pt wearing mask not removed for exam due to covid -19 concerns.    NECK :  without JVD/Nodes/TM/ nl carotid upstrokes bilaterally   LUNGS: no acc muscle use,  Mod barrel  contour chest wall with bilateral  Distant bs s audible wheeze and  without cough on insp or exp maneuvers and mod  Hyperresonant  to  percussion bilaterally     CV:  RRR  no s3 or murmur or increase in P2, and no edema   ABD:  soft and nontender with pos mid insp Hoover's  in the supine position. No bruits or organomegaly appreciated, bowel sounds nl  MS:     ext warm without deformities, calf tenderness, cyanosis or clubbing No obvious joint restrictions   SKIN: warm and dry without lesions    NEURO:  alert, approp, nl sensorium with  no motor or cerebellar deficits apparent.               Assessment:

## 2020-12-26 DIAGNOSIS — Z1211 Encounter for screening for malignant neoplasm of colon: Secondary | ICD-10-CM | POA: Diagnosis not present

## 2021-01-26 ENCOUNTER — Telehealth: Payer: Self-pay | Admitting: Internal Medicine

## 2021-01-26 ENCOUNTER — Other Ambulatory Visit: Payer: Self-pay

## 2021-01-26 NOTE — Patient Outreach (Signed)
[facility]) Care Management  01/26/2021  [name] 1950-02-17 [id number]   Telephone Assessment    Unsuccessful outreach attempt to patient-repeated busy signal.    Plan: RN CM will make quarterly outreach attempt to patient within the month of April.   [name], RN,BSN,CCM [facility] Telephonic Care Management Coordinator Direct Phone: [phone] Toll Free: [phone] Fax: [phone]

## 2021-01-29 ENCOUNTER — Ambulatory Visit: Payer: Self-pay

## 2021-01-29 NOTE — Telephone Encounter (Signed)
[employer] is able to pull pt's notes from [place]. Community message has been sent to our [employer] team to make them aware of this message. Nothing further needed.

## 2021-02-08 NOTE — Telephone Encounter (Addendum)
Called [employer] and spoke with rep  He states that they can only see epic notes on pt's that live in [place]  He gave me another number to send note to [phone]  Note faxed  Will f/u with Melissa regarding [employer]'s access to our noyes

## 2021-02-19 DIAGNOSIS — J449 Chronic obstructive pulmonary disease, unspecified: Secondary | ICD-10-CM | POA: Diagnosis not present

## 2021-02-19 DIAGNOSIS — E7800 Pure hypercholesterolemia, unspecified: Secondary | ICD-10-CM | POA: Diagnosis not present

## 2021-02-19 DIAGNOSIS — J441 Chronic obstructive pulmonary disease with (acute) exacerbation: Secondary | ICD-10-CM | POA: Diagnosis not present

## 2021-02-19 DIAGNOSIS — I10 Essential (primary) hypertension: Secondary | ICD-10-CM | POA: Diagnosis not present

## 2021-02-19 DIAGNOSIS — G8929 Other chronic pain: Secondary | ICD-10-CM | POA: Diagnosis not present

## 2021-03-06 ENCOUNTER — Telehealth: Payer: Self-pay | Admitting: Internal Medicine

## 2021-03-06 NOTE — Telephone Encounter (Signed)
Called and LVM for RT to schedule patient.   ATC patient to notify that I called RT and to be aware they would be calling to schedule. No answer and no VM available. Phone rang for approx. 2 minutes.

## 2021-03-06 NOTE — Telephone Encounter (Signed)
Called and spoke to patients wife Terri Piedra (ok per DPR). She states she will let patient know that [facility] has been contacted regarding PFT at [facility]. Nothing further needed.

## 2021-03-18 ENCOUNTER — Other Ambulatory Visit: Payer: Self-pay | Admitting: Cardiology

## 2021-03-23 ENCOUNTER — Other Ambulatory Visit (HOSPITAL_COMMUNITY)
Admission: RE | Admit: 2021-03-23 | Discharge: 2021-03-23 | Disposition: A | Discharge status: Home / Self Care | Payer: Medicare HMO | Source: Ambulatory Visit | Attending: Internal Medicine | Admitting: Internal Medicine

## 2021-03-23 DIAGNOSIS — Z01812 Encounter for preprocedural laboratory examination: Secondary | ICD-10-CM | POA: Insufficient documentation

## 2021-03-23 DIAGNOSIS — Z20822 Contact with and (suspected) exposure to covid-19: Secondary | ICD-10-CM | POA: Insufficient documentation

## 2021-03-23 DIAGNOSIS — E872 Acidosis, unspecified: Secondary | ICD-10-CM

## 2021-03-23 DIAGNOSIS — J9611 Chronic respiratory failure with hypoxia: Secondary | ICD-10-CM | POA: Insufficient documentation

## 2021-03-24 LAB — SARS CORONAVIRUS 2 (TAT 6-24 HRS): SARS Coronavirus 2: NEGATIVE

## 2021-03-27 ENCOUNTER — Ambulatory Visit (HOSPITAL_COMMUNITY)
Admission: RE | Admit: 2021-03-27 | Discharge: 2021-03-27 | Disposition: A | Discharge status: Home / Self Care | Payer: Medicare HMO | Source: Ambulatory Visit | Attending: Internal Medicine | Admitting: Internal Medicine

## 2021-03-27 ENCOUNTER — Other Ambulatory Visit: Payer: Self-pay

## 2021-03-27 DIAGNOSIS — J449 Chronic obstructive pulmonary disease, unspecified: Secondary | ICD-10-CM | POA: Insufficient documentation

## 2021-03-27 DIAGNOSIS — R6889 Other general symptoms and signs: Secondary | ICD-10-CM | POA: Diagnosis not present

## 2021-03-27 LAB — PULMONARY FUNCTION TEST
DL/VA % pred: 52 %
DL/VA: 2.14 ml/min/mmHg/L
DLCO unc % pred: 29 %
DLCO unc: 7.7 ml/min/mmHg
FEF 25-75 Post: 0.26 L/sec
FEF 25-75 Pre: 0.15 L/sec
FEF2575-%Change-Post: 74 %
FEF2575-%Pred-Post: 10 %
FEF2575-%Pred-Pre: 5 %
FEV1-%Change-Post: 20 %
FEV1-%Pred-Post: 20 %
FEV1-%Pred-Pre: 17 %
FEV1-Post: 0.68 L
FEV1-Pre: 0.56 L
FEV1FVC-%Change-Post: -5 %
FEV1FVC-%Pred-Pre: 36 %
FEV6-%Change-Post: 29 %
FEV6-%Pred-Post: 42 %
FEV6-%Pred-Pre: 32 %
FEV6-Post: 1.78 L
FEV6-Pre: 1.37 L
FEV6FVC-%Change-Post: 2 %
FEV6FVC-%Pred-Post: 71 %
FEV6FVC-%Pred-Pre: 70 %
FVC-%Change-Post: 26 %
FVC-%Pred-Post: 58 %
FVC-%Pred-Pre: 46 %
FVC-Post: 2.61 L
FVC-Pre: 2.06 L
Post FEV1/FVC ratio: 26 %
Post FEV6/FVC ratio: 68 %
Pre FEV1/FVC ratio: 27 %
Pre FEV6/FVC Ratio: 66 %
RV % pred: 190 %
RV: 4.62 L
TLC % pred: 96 %
TLC: 6.76 L

## 2021-03-27 MED ORDER — ALBUTEROL SULFATE (2.5 MG/3ML) 0.083% IN NEBU
2.5000 mg | INHALATION_SOLUTION | Freq: Once | RESPIRATORY_TRACT | Status: AC
  Administered 2021-03-27: 2.5 mg via RESPIRATORY_TRACT

## 2021-04-02 NOTE — Progress Notes (Signed)
Spoke with pt and notified of results per Dr. Wert. Pt verbalized understanding and denied any questions. 

## 2021-04-05 DIAGNOSIS — I10 Essential (primary) hypertension: Secondary | ICD-10-CM | POA: Diagnosis not present

## 2021-04-05 DIAGNOSIS — J9611 Chronic respiratory failure with hypoxia: Secondary | ICD-10-CM | POA: Diagnosis not present

## 2021-04-05 DIAGNOSIS — F331 Major depressive disorder, recurrent, moderate: Secondary | ICD-10-CM | POA: Diagnosis not present

## 2021-04-05 DIAGNOSIS — I700 Atherosclerosis of aorta: Secondary | ICD-10-CM | POA: Diagnosis not present

## 2021-04-05 DIAGNOSIS — I739 Peripheral vascular disease, unspecified: Secondary | ICD-10-CM | POA: Diagnosis not present

## 2021-04-05 DIAGNOSIS — J449 Chronic obstructive pulmonary disease, unspecified: Secondary | ICD-10-CM | POA: Diagnosis not present

## 2021-04-05 DIAGNOSIS — G894 Chronic pain syndrome: Secondary | ICD-10-CM | POA: Diagnosis not present

## 2021-04-05 DIAGNOSIS — I714 Abdominal aortic aneurysm, without rupture, unspecified: Secondary | ICD-10-CM | POA: Diagnosis not present

## 2021-04-05 DIAGNOSIS — E039 Hypothyroidism, unspecified: Secondary | ICD-10-CM | POA: Diagnosis not present

## 2021-04-13 DIAGNOSIS — I714 Abdominal aortic aneurysm, without rupture, unspecified: Secondary | ICD-10-CM | POA: Diagnosis not present

## 2021-04-13 DIAGNOSIS — R6889 Other general symptoms and signs: Secondary | ICD-10-CM | POA: Diagnosis not present

## 2021-04-19 ENCOUNTER — Other Ambulatory Visit: Payer: Self-pay | Admitting: Internal Medicine

## 2021-04-19 ENCOUNTER — Other Ambulatory Visit: Payer: Self-pay | Admitting: Cardiology

## 2021-04-20 DIAGNOSIS — J449 Chronic obstructive pulmonary disease, unspecified: Secondary | ICD-10-CM | POA: Diagnosis not present

## 2021-04-20 DIAGNOSIS — I10 Essential (primary) hypertension: Secondary | ICD-10-CM | POA: Diagnosis not present

## 2021-04-20 DIAGNOSIS — E7800 Pure hypercholesterolemia, unspecified: Secondary | ICD-10-CM | POA: Diagnosis not present

## 2021-04-23 NOTE — Progress Notes (Addendum)
Subjective:  ?  ? Patient ID: [name], male   DOB: 19-Nov-1950    MRN: [id number] ? ?  ?Brief patient profile: 69 yowm MM/reports quit smoking 12/2019 with h/o IHD then sob both with exertion and at rest and noct x summer 2016 eval by Dr Annamaria Boots in 11/14/14 rec anoro and temporarily improved but says could not get it refilled and since then just using saba in multiple forms so referred to pulmonary clinic 04/30/2016 by Dr   Earle Gell and unable to perform spirometry  ? ? ?History of Present Illness  ?04/30/2016 1st [facility] office visit/ [name] on saba only   ?Chief Complaint  ?Patient presents with  ? Pulmonary Consult  ?  Referred by Dr. Earle Gell for eval of COPD.  Pt states he was dxed with COPD approx 1 yr ago. He has been on o2 24/7 since July 2018.  He is using neb with albuterol and ventolin inhaler approx 3 x per day. He states that he feels SOB with exertion such as walking to the mailbox. He is sometimes SOB just sitting and occ wakes up with SOB.    ?Lives in [place]:   MB is  downhill and has to stop, uses inhaler and no problem at all walking back to house s 02  ?Never tried to  Use saba  before exertion/ last used one hour prior to OV ? sob  can occur s warning at rest even if not assoc cough or cp ?rec ? Plan A = Automatic = symbicort 160 Take 2 puffs first thing in am and then another 2 puffs about 12 hours later.  ?Work on inhaler technique:  relax and gently blow all the way out then take a nice smooth deep breath back in, triggering the inhaler at same time you start breathing in.  Hold for up to 5 seconds if you can. Blow out thru nose. Rinse and gargle with water when done ?Plan B = Backup ?Only use your albuterol (VENTOLIN)  as a rescue medication ?Plan C = Crisis ?- only use your albuterol nebulizer if you first try Plan B and it fails to help > ok to use the nebulizer up to every 4 hours but if start needing it regularly call for immediate appointment ?The key is to stop smoking  completely before smoking completely stops you!  ? ?  ? ?05/12/2020  f/u ov/[place] office/[name] re: copd / quit smoking Dec 2021  ?Chief Complaint  ?Patient presents with  ? Follow-up  ?  Shortness of breath with activity which patient states is the same as before  ?Dyspnea:  Has learned to pace him self  ?Cough: rarely  ?Sleeping: elevated 25 degrees/ rarely having noct symptoms ?SABA use: maybe every 6 hours including 1st thing in am  ?02: 3.5 lpm hs. None needed at rest, confused about how to use amb 02  ?Covid status: never vaccinated and doesn't plan to  ?Rec ?Plan A = Automatic = Always=    Bretri (or symbicort 160)  Take 2 puffs first thing in am and then another 2 puffs about 12 hours later.  ?Plan B = Backup (to supplement plan A, not to replace it) ?Only use your albuterol inhaler as a rescue medication ?Plan C = Crisis (instead of Plan B but only if Plan B stops working) ?- only use your albuterol nebulizer if you first try Plan B  ?Make sure you check your oxygen saturation  at your highest level  of activity  to be sure it stays over 90%  ?You will need to see your primary medical doctor if your blood pressure runs about 140/85 on your medications ?Please schedule a follow up visit in 3 months but call sooner if needed  ? ?  ? ? ?12/15/2020  f/u ov/[place] office/[name] re: copd clinically, could not do pft,  maint on breztri  2bid  ?Chief Complaint  ?Patient presents with  ? Follow-up  ?  SOB with exertion has worsened since last ov. Uses 3.5 lpm cont. With activity and at rest.   ?Dyspnea:  mailbox and back uphill back to house/ around noon p breztri in am and uses saba when gets back to hourse  ?Cough: none  ?Sleeping: about 25 degrees and does fine  ?SABA use: 4 x daily hfa/  ?02: 3.5 lpm cont  ?Covid status: never vax  ?Lung cancer screening: doing serial CTa march yearly to f/u TA ?Rec ?Make sure you check your oxygen saturation  AT  your highest level of activity (not after you stop)    ?We can  refer to you to [employer] for best fit for amb 02  ?Ok to try albuterol 15 min before an activity (on alternating days)  that you know would usually make you short of breath ?Please schedule a follow up visit in 6 months  - PFTs on return ? ? ? ?04/24/2021  f/u ov/[place] office/[name] re: GOLD 4 maint on Breztri /02  ?Chief Complaint  ?Patient presents with  ? Follow-up  ?  Patient is having increased shortness of breath with exertion. Wears 4 liters of oxygen all the time. Had PFT done  ?Dyspnea:  walking across house on 3.5 lpm not titrating as rec/ extremely sedentary  ?Cough: no  ?Sleeping: 25 degrees one flat pillow  ?SABA use: hfa  sev times a day / neb ?  (Very confused with prns and maint rx ) ?02: 3-4 lpm really not titrating  ?  ? ? ?No obvious day to day or daytime variability or assoc excess/ purulent sputum or mucus plugs or hemoptysis or cp or chest tightness, subjective wheeze or overt sinus or hb symptoms.  ? ?Sleeping as above without nocturnal  or early am exacerbation  of respiratory  c/o's or need for noct saba. Also denies any obvious fluctuation of symptoms with weather or environmental changes or other aggravating or alleviating factors except as outlined above  ? ?No unusual exposure hx or h/o childhood pna/ asthma or knowledge of premature birth. ? ?Current Allergies, Complete Past Medical History, Past Surgical History, Family History, and Social History were reviewed in [employer] record. ? ?ROS  The following are not active complaints unless bolded ?Hoarseness, sore throat, dysphagia, dental problems, itching, sneezing,  nasal congestion or discharge of excess mucus or purulent secretions, ear ache,   fever, chills, sweats, unintended wt loss or wt gain, classically pleuritic or exertional cp,  orthopnea pnd or arm/hand swelling  or leg swelling, presyncope, palpitations, abdominal pain, anorexia, nausea, vomiting, diarrhea  or change in bowel habits or change in  bladder habits, change in stools or change in urine, dysuria, hematuria,  rash, arthralgias, visual complaints, headache, numbness, weakness or ataxia or problems with walking or coordination,  change in mood= anxious or  memory. ?      ? ?Current Meds - - NOTE:   Unable to verify as accurately reflecting what pt takes    ?Medication Sig  ? acetaminophen (TYLENOL)  500 MG tablet Take 500 mg by mouth every 6 (six) hours as needed for mild pain or headache.  ? albuterol (PROVENTIL) (2.5 MG/3ML) 0.083% nebulizer solution Take 3 mLs (2.5 mg total) by nebulization every 6 (six) hours as needed for wheezing or shortness of breath.  ? albuterol (VENTOLIN HFA) 108 (90 Base) MCG/ACT inhaler INHALE TWO puffs BY MOUTH EVERY 4 HOURS AS NEEDED (IF can't catch breath)  ? ALPRAZolam (XANAX) 0.5 MG tablet Take 0.5 mg by mouth 2 (two) times daily as needed for anxiety.   ? aspirin EC 81 MG tablet Take 1 tablet (81 mg total) by mouth daily.  ? atorvastatin (LIPITOR) 40 MG tablet Take 1 tablet (40 mg total) by mouth daily.  ? Budeson-Glycopyrrol-Formoterol (BREZTRI AEROSPHERE) 160-9-4.8 MCG/ACT AERO Inhale 2 puffs into the lungs 2 (two) times daily.  ? busPIRone (BUSPAR) 7.5 MG tablet Take 1 tablet (7.5 mg total) by mouth 3 (three) times daily.  ? clopidogrel (PLAVIX) 75 MG tablet TAKE ONE TABLET BY MOUTH ONCE DAILY  ? escitalopram (LEXAPRO) 5 MG tablet Take 5 mg by mouth daily.  ? feeding supplement, ENSURE ENLIVE, (ENSURE ENLIVE) LIQD Take 237 mLs by mouth 2 (two) times daily between meals.  ? gabapentin (NEURONTIN) 300 MG capsule Take 300 mg by mouth 3 (three) times daily.  ? isosorbide mononitrate (IMDUR) 30 MG 24 hr tablet TAKE ONE TABLET BY MOUTH EVERY MORNING Needs appointment for further refills  ? levothyroxine (SYNTHROID, LEVOTHROID) 50 MCG tablet Take 50 mcg by mouth daily.  ? metoprolol tartrate (LOPRESSOR) 25 MG tablet Take 1 tablet (25 mg total) by mouth 3 (three) times daily.  ? nitroGLYCERIN (NITROSTAT) 0.4 MG SL tablet  Place 1 tablet (0.4 mg total) under the tongue every 5 (five) minutes as needed for chest pain.  ? OXYGEN Inhale 3.5 L into the lungs daily as needed (for shortness of breath). 3-3.5lpm 24/7  ? pantopra

## 2021-04-24 ENCOUNTER — Encounter: Payer: Self-pay | Admitting: Internal Medicine

## 2021-04-24 ENCOUNTER — Ambulatory Visit: Payer: Medicare HMO | Admitting: Internal Medicine

## 2021-04-24 DIAGNOSIS — R6889 Other general symptoms and signs: Secondary | ICD-10-CM | POA: Diagnosis not present

## 2021-04-24 DIAGNOSIS — J449 Chronic obstructive pulmonary disease, unspecified: Secondary | ICD-10-CM | POA: Diagnosis not present

## 2021-04-24 DIAGNOSIS — J9611 Chronic respiratory failure with hypoxia: Secondary | ICD-10-CM | POA: Diagnosis not present

## 2021-04-24 NOTE — Patient Instructions (Addendum)
Make sure you check your oxygen saturation  AT  your highest level of activity (not after you stop)   to be sure it stays over 90% and adjust  02 flow upward to maintain this level if needed but remember to turn it back to previous settings when you stop (to conserve your supply).  ? ?Plan A = Automatic = Always=    Breztri Take 2 puffs first thing in am and then another 2 puffs about 12 hours later.  ?  ?Work on inhaler technique:  relax and gently blow all the way out then take a nice smooth full deep breath back in, triggering the inhaler at same time you start breathing in.  Hold for up to 5 seconds if you can. Blow out thru nose. Rinse and gargle with water when done.  If mouth or throat bother you at all,  try brushing teeth/gums/tongue with arm and hammer toothpaste/ make a slurry and gargle and spit out.  ? ?   ? ?Plan B = Backup (to supplement plan A, not to replace it) ?Only use your albuterol inhaler as a rescue medication to be used if you can't catch your breath by resting or doing a relaxed purse lip breathing pattern.  ?- The less you use it, the better it will work when you need it. ?- Ok to use the inhaler up to 2 puffs  every 4 hours if you must but call for appointment if use goes up over your usual need ?- Don't leave home without it !!  (think of it like the spare tire for your car)  ? ?Plan C = Crisis (instead of Plan B but only if Plan B stops working) ?- only use your albuterol nebulizer if you first try Plan B and it fails to help > ok to use the nebulizer up to every 4 hours but if start needing it regularly call for immediate appointment ? ? Try taking protonix 40 mg Take 30-60 min before  last meal of the day  ? ? ?Please schedule a follow up office visit in 6 weeks, call sooner if needed with all medications /inhalers/ solutions in hand so we can verify exactly what you are taking. This includes all medications from all doctors and over the counters  ?

## 2021-04-25 ENCOUNTER — Encounter: Payer: Self-pay | Admitting: Internal Medicine

## 2021-04-25 NOTE — Assessment & Plan Note (Signed)
As of 04/03/2020  =  3.5 lpm hs and prn daytime ?-  04/26/2020   Walked 4lpm   approx   200 ft  @ slow pace  stopped due to sob with lowest sats 93%    ?-  05/12/2020   Walked RA  approx   450 ft  @ slow pace  stopped due to  Sob at 150 with sats down to 80% corrected to 95% on 2lpm   ?- 12/15/2020 sats ok RA at rest > referred for POC  ?- 12/15/2020   Walked on 3.5 cont  x  1  lap(s) =  approx 176f @ slow pace, stopped due to back pain/sob with lowest 02 sats 98%   ?- 04/24/2021   Walked on 3.5 lpm cont  x  one  lap(s) =  approx 150  ft  @ slow/ unsteady pace, stopped due to sob with sats consistently above 90%   ? ?Advised: ?Make sure you check your oxygen saturation  AT  your highest level of activity (not after you stop)   to be sure it stays over 90% and adjust  02 flow upward to maintain this level if needed but remember to turn it back to previous settings when you stop (to conserve your supply).  ? ?He would benefit from pulmonary rehab if he would agree to go but first we need to get his meds straightened out so rec f/u ov next available with all meds in hand using a trust but verify approach to confirm accurate Medication  Reconciliation The principal here is that until we are certain that the  patients are doing what we've asked, it makes no sense to ask them to do more.  ? ?Each maintenance medication was reviewed in detail including emphasizing most importantly the difference between maintenance and prns and under what circumstances the prns are to be triggered using an action plan format where appropriate. ? ?Total time for H and P, chart review, counseling, reviewing hfa/neb/02 device(s) , directly observing portions of ambulatory 02 saturation study/ and generating customized AVS unique to this office visit / same day charting = 35 min  ?     ?  ?      ?

## 2021-04-25 NOTE — Assessment & Plan Note (Signed)
MM/Active smoker > says quit 12/2019  ?- 04/30/2016   try symbicort 160 2bid  ?- 04/03/2020  After extensive coaching inhaler device,  effectiveness =    90% try breztri 2 every 12 hours x 4 weeks samples  And f/u in [place]  ?- Labs ordered 04/26/2020  :   alpha one AT phenotype   MM  Level 170  ? 04/26/2020   Allergy profile >  Eos 0.2 /  IgE 44  ?- 12/15/2020  After extensive coaching inhaler device,  effectiveness =    90% ?- PFT's  03/27/21  FEV1 0.68 (20 % ) ratio 0.26  p 20 % improvement from saba p albuterol and ? breztri prior to study with DLCO  7.70 (29%) and FV curve classically concave    ?- 04/24/2021  After extensive coaching inhaler device,  effectiveness =    80%  ? ? Group D in terms of symptom/risk and laba/lama/ICS  therefore appropriate rx at this point >>> breztri and approp saba  ? ?Re SABA :  I spent extra time with pt today reviewing appropriate use of albuterol for prn use on exertion with the following points: ?1) saba is for relief of sob that does not improve by walking a slower pace or resting but rather if the pt does not improve after trying this first. ?2) If the pt is convinced, as many are, that saba helps recover from activity faster then it's easy to tell if this is the case by re-challenging : ie stop, take the inhaler, then p 5 minutes try the exact same activity (intensity of workload) that just caused the symptoms and see if they are substantially diminished or not after saba ?3) if there is an activity that reproducibly causes the symptoms, try the saba 15 min before the activity on alternate days  ? ?If in fact the saba really does help, then fine to continue to use it prn but advised may need to look closer at the maintenance regimen being used to achieve better control of airways disease with exertion.  ? ?  ?

## 2021-04-26 ENCOUNTER — Other Ambulatory Visit: Payer: Self-pay

## 2021-04-26 NOTE — Patient Outreach (Signed)
[facility]) Care Management ? ?04/26/2021 ? ?[name] ?Jul 23, 1950 ?[id number] ? ? ?Telephone Assessment ? ? ?Unsuccessful outreach attempt-line busy.  ? ? ? ? ?Plan: ?RN CM will make quarterly outreach within the month of July. ? ?Jim Montgomery, RN,BSN,CCM ?[facility] Care Management ?Telephonic Care Management Coordinator ?Direct Phone: [phone] ?Toll Free: [phone] ?Fax: [phone] ? ?

## 2021-05-17 ENCOUNTER — Other Ambulatory Visit: Payer: Self-pay | Admitting: Cardiology

## 2021-05-25 ENCOUNTER — Other Ambulatory Visit: Payer: Self-pay | Admitting: Cardiology

## 2021-06-05 ENCOUNTER — Ambulatory Visit (INDEPENDENT_AMBULATORY_CARE_PROVIDER_SITE_OTHER): Payer: Medicare HMO | Admitting: Internal Medicine

## 2021-06-05 ENCOUNTER — Encounter: Payer: Self-pay | Admitting: Internal Medicine

## 2021-06-05 VITALS — BP 138/88 | HR 88 | Temp 98.6°F | Ht 70.0 in | Wt 145.2 lb

## 2021-06-05 DIAGNOSIS — J9611 Chronic respiratory failure with hypoxia: Secondary | ICD-10-CM

## 2021-06-05 DIAGNOSIS — J449 Chronic obstructive pulmonary disease, unspecified: Secondary | ICD-10-CM

## 2021-06-05 DIAGNOSIS — R6889 Other general symptoms and signs: Secondary | ICD-10-CM | POA: Diagnosis not present

## 2021-06-05 MED ORDER — BENZONATATE 200 MG PO CAPS: 200.0000 mg | ORAL_CAPSULE | Freq: Three times a day (TID) | ORAL | 1 refills | Status: AC | PRN

## 2021-06-05 MED ORDER — PREDNISONE 10 MG PO TABS: ORAL_TABLET | ORAL | 2 refills | Status: AC

## 2021-06-05 MED ORDER — BREZTRI AEROSPHERE 160-9-4.8 MCG/ACT IN AERO: 2.0000 | INHALATION_SPRAY | Freq: Two times a day (BID) | RESPIRATORY_TRACT | 0 refills | Status: AC

## 2021-06-05 MED ORDER — PREDNISONE 10 MG PO TABS: ORAL_TABLET | ORAL | 2 refills | Status: DC

## 2021-06-05 NOTE — Patient Instructions (Addendum)
Make sure you check your oxygen saturation  AT  your highest level of activity (not after you stop)   to be sure it stays over 90% and adjust  02 flow upward to maintain this level if needed but remember to turn it back to previous settings when you stop (to conserve your supply).  ? ?Plan A = Automatic = Always=    Breztri Take 2 puffs first thing in am and then another 2 puffs about 12 hours later.  And PREDNISONE 10 mg daily  ?  ?Work on inhaler technique:  relax and gently blow all the way out then take a nice smooth full deep breath back in, triggering the inhaler at same time you start breathing in.  Hold for up to 5 seconds if you can. Blow out thru nose. Rinse and gargle with water when done.  If mouth or throat bother you at all,  try brushing teeth/gums/tongue with arm and hammer toothpaste/ make a slurry and gargle and spit out.  ? ?  ?Plan B = Backup (to supplement plan A, not to replace it) ?Only use your albuterol inhaler as a rescue medication to be used if you can't catch your breath by resting or doing a relaxed purse lip breathing pattern.  ?- The less you use it, the better it will work when you need it. ?- Ok to use the inhaler up to 2 puffs  every 4 hours if you must but call for appointment if use goes up over your usual need ?- Don't leave home without it !!  (think of it like the spare tire for your car)  ? ?Plan C = Crisis (instead of Plan B but only if Plan B stops working) ?- only use your albuterol nebulizer if you first try Plan B and it fails to help > ok to use the nebulizer up to every 4 hours but if start needing it regularly call for immediate appointment ? ?Plan D = double the prednisone until better  ? ?For cough > tessalon 200 mg  every 6 hours as needed  ? ?For congested cough > mucinex '1200mg'$  every 12 hours as needed ? ?Please remember to go to the  x-ray department  @  [facility] for your tests - we will call you with the results when they are available    ? ?Please  schedule a follow up office visit in 6 weeks, call sooner if needed  ? ? ? ?

## 2021-06-05 NOTE — Assessment & Plan Note (Addendum)
MM/ x smoker  says quit 12/2019  ?- 04/30/2016   try symbicort 160 2bid  ?- 04/03/2020  After extensive coaching inhaler device,  effectiveness =    90% try breztri 2 every 12 hours x 4 weeks samples  And f/u in [place]  ?- Labs ordered 04/26/2020  :   alpha one AT phenotype   MM  Level 170  ? 04/26/2020   Allergy profile >  Eos 0.2 /  IgE 44  ?- 12/15/2020  After extensive coaching inhaler device,  effectiveness =    90% ?- PFT's  03/27/21  FEV1 0.68 (20 % ) ratio 0.26  p 20 % improvement from saba p albuterol and ? breztri prior to study with DLCO  7.70 (29%) and FV curve classically concave    ?- 06/05/2021  After extensive coaching inhaler device,  effectiveness =   85% > continue breztri  ?- 06/05/2021 started daily pred @ ceiling = 20 mg and floor is 10 mg  ? ? Group D (now reclassified as E) in terms of symptom/risk and laba/lama/ICS  therefore appropriate rx at this point >>>  breztri plus approp saba plus Pred as plan D for now and f/u in 6 weeks  ? ?Unintended wt loss is a very bad sign in this setting > rec start with cxr but he did not go for cxr as rec  ? ?For cough > mucinex 1200 bid and prn tessalon 200 for dry cough component prn ? ?  ? ? ? ?

## 2021-06-05 NOTE — Progress Notes (Signed)
Subjective:  ?  ? Patient ID: [name], male   DOB: 1950/04/12    MRN: [id number] ? ?  ?Brief patient profile:  ?3 yowm MM/reports quit smoking 12/2019 with h/o IHD then sob both with exertion and at rest and noct x summer 2016 eval by Dr Annamaria Boots in 11/14/14 rec anoro and temporarily improved but says could not get it refilled and since then just using saba in multiple forms so referred to pulmonary clinic 04/30/2016 by Dr   Earle Gell and unable to perform spirometry  ? ? ?History of Present Illness  ?04/30/2016 1st [facility] office visit/ [name] on saba only   ?Chief Complaint  ?Patient presents with  ? Pulmonary Consult  ?  Referred by Dr. Earle Gell for eval of COPD.  Pt states he was dxed with COPD approx 1 yr ago. He has been on o2 24/7 since July 2018.  He is using neb with albuterol and ventolin inhaler approx 3 x per day. He states that he feels SOB with exertion such as walking to the mailbox. He is sometimes SOB just sitting and occ wakes up with SOB.    ?Lives in [place]:   MB is  downhill and has to stop, uses inhaler and no problem at all walking back to house s 02  ?Never tried to  Use saba  before exertion/ last used one hour prior to OV ? sob  can occur s warning at rest even if not assoc cough or cp ?rec ? Plan A = Automatic = symbicort 160 Take 2 puffs first thing in am and then another 2 puffs about 12 hours later.  ?Work on inhaler technique:  relax and gently blow all the way out then take a nice smooth deep breath back in, triggering the inhaler at same time you start breathing in.  Hold for up to 5 seconds if you can. Blow out thru nose. Rinse and gargle with water when done ?Plan B = Backup ?Only use your albuterol (VENTOLIN)  as a rescue medication ?Plan C = Crisis ?- only use your albuterol nebulizer if you first try Plan B and it fails to help > ok to use the nebulizer up to every 4 hours but if start needing it regularly call for immediate appointment ?The key is to stop  smoking completely before smoking completely stops you!  ? ? ? ? ?04/24/2021  f/u ov/[place] office/[name] re: GOLD 4 maint on Breztri /02  ?Chief Complaint  ?Patient presents with  ? Follow-up  ?  Patient is having increased shortness of breath with exertion. Wears 4 liters of oxygen all the time. Had PFT done  ?Dyspnea:  walking across house on 3.5 lpm not titrating as rec/ extremely sedentary  ?Cough: no  ?Sleeping: 25 degrees one flat pillow  ?SABA use: hfa  sev times a day / neb ?  (Very confused with prns and maint rx ) ?02: 3-4 lpm really not titrating   ?Rec ?Make sure you check your oxygen saturation  AT  your highest level of activity (not after you stop)   to be sure it stays over 90%  ?Plan A = Automatic = Always=    Breztri Take 2 puffs first thing in am and then another 2 puffs about 12 hours later.  ?Work on inhaler technique: ?Plan B = Backup (to supplement plan A, not to replace it) ?Only use your albuterol inhaler as a rescue medication to be used i ?Plan C =  Crisis (instead of Plan B but only if Plan B stops working) ?- only use your albuterol nebulizer if you first try Plan B and it fails to help   ?Try taking protonix 40 mg Take 30-60 min before  last meal of the day  ?Please schedule a follow up office visit in 6 weeks, call sooner if needed with all medications /inhalers/ solutions in hand so we can verify exactly what you are taking. This includes all medications from all doctors and over the counters  ? ? ?06/05/2021  f/u ov/[place] office/[name] re: GOLD 4 copd  maint on breztri  ?Chief Complaint  ?Patient presents with  ? Follow-up  ?  Patient feels his breathing has worsened. He states he cannot walk from his couch to his kitchen anymore without being SOB. Went from 166 to 145 since last OV.   ?Dyspnea:  worse than usual x one week/ better only while on prednisone  ?Cough: worse than usual dry day > noct , also better on prednisone ?Sleeping: 25 degrees hob electric  ?SABA use: hfa 3-4 x  daily / neb  ?02: 3-4 lpm  ?Covid status: none  ?  ?No obvious day to day or daytime variability or assoc excess/ purulent sputum or mucus plugs or hemoptysis or cp or chest tightness, subjective wheeze or overt sinus or hb symptoms.  ? ?Sleeping  without nocturnal  or early am exacerbation  of respiratory  c/o's or need for noct saba. Also denies any obvious fluctuation of symptoms with weather or environmental changes or other aggravating or alleviating factors except as outlined above  ? ?No unusual exposure hx or h/o childhood pna/ asthma or knowledge of premature birth. ? ?Current Allergies, Complete Past Medical History, Past Surgical History, Family History, and Social History were reviewed in [employer] record. ? ?ROS  The following are not active complaints unless bolded ?Hoarseness, sore throat, dysphagia, dental problems, itching, sneezing,  nasal congestion or discharge of excess mucus or purulent secretions, ear ache,   fever, chills, sweats, unintended wt loss or wt gain, classically pleuritic or exertional cp,  orthopnea pnd or arm/hand swelling  or leg swelling, presyncope, palpitations, abdominal pain, anorexia, nausea, vomiting, diarrhea  or change in bowel habits or change in bladder habits, change in stools or change in urine, dysuria, hematuria,  rash, arthralgias, visual complaints, headache, numbness, weakness or ataxia or problems with walking or coordination,  change in mood or  memory. ?      ? ?Current Meds  ?Medication Sig  ? albuterol (PROVENTIL) (2.5 MG/3ML) 0.083% nebulizer solution Take 3 mLs (2.5 mg total) by nebulization every 6 (six) hours as needed for wheezing or shortness of breath.  ? albuterol (VENTOLIN HFA) 108 (90 Base) MCG/ACT inhaler INHALE TWO puffs BY MOUTH EVERY 4 HOURS AS NEEDED (IF can't catch breath)  ? ALPRAZolam (XANAX) 0.5 MG tablet Take 0.5 mg by mouth 2 (two) times daily as needed for anxiety.   ? aspirin EC 81 MG tablet Take 1 tablet (81  mg total) by mouth daily.  ? atorvastatin (LIPITOR) 40 MG tablet Take 1 tablet (40 mg total) by mouth daily.  ? Budeson-Glycopyrrol-Formoterol (BREZTRI AEROSPHERE) 160-9-4.8 MCG/ACT AERO Inhale 2 puffs into the lungs 2 (two) times daily.  ? busPIRone (BUSPAR) 7.5 MG tablet Take 1 tablet (7.5 mg total) by mouth 3 (three) times daily.  ? clopidogrel (PLAVIX) 75 MG tablet TAKE ONE TABLET BY MOUTH ONCE DAILY  ? escitalopram (LEXAPRO) 5 MG tablet  Take 5 mg by mouth daily.  ? feeding supplement, ENSURE ENLIVE, (ENSURE ENLIVE) LIQD Take 237 mLs by mouth 2 (two) times daily between meals.  ? gabapentin (NEURONTIN) 300 MG capsule Take 300 mg by mouth 3 (three) times daily.  ? levothyroxine (SYNTHROID, LEVOTHROID) 50 MCG tablet Take 50 mcg by mouth daily.  ? metoprolol tartrate (LOPRESSOR) 25 MG tablet Take 1 tablet (25 mg total) by mouth 3 (three) times daily.  ? nitroGLYCERIN (NITROSTAT) 0.4 MG SL tablet Place 1 tablet (0.4 mg total) under the tongue every 5 (five) minutes as needed for chest pain.  ? OXYGEN Inhale 3.5 L into the lungs daily as needed (for shortness of breath). 3-3.5lpm 24/7  ? pantoprazole (PROTONIX) 40 MG tablet Take 1 tablet (40 mg total) by mouth at bedtime.  ? traMADol (ULTRAM) 50 MG tablet Take 2 tablets (100 mg total) by mouth every 8 (eight) hours as needed for severe pain.  ? traZODone (DESYREL) 100 MG tablet Take 1 tablet (100 mg total) by mouth at bedtime as needed for sleep.  ?     ? ?  ? ?  ?  ?Objective:  ? Physical Exam  ? ?Wts ? ?06/05/2021        145   ?04/24/2021          166 ?12/15/2020      171  ?05/12/2020        170 ?04/26/2020         173 ?04/03/2020        173 ?10/11/2016       144   ?04/30/16 143 lb (64.9 kg)  ?11/17/15 142 lb 10.2 oz (64.7 kg)  ?08/03/15 150 lb (68 kg)  ?  ?Vital signs reviewed  06/05/2021  - Note at rest 02 sats  97% on 3.5 lpm cont  ? ?General appearance:    amb somber wm mild rattling cough   ? ? ?HEENT :  Oropharynx  nl/ edentulous  Nasal turbintes nl ? ? ?NECK :   without JVD/Nodes/TM/ nl carotid upstrokes bilaterally ? ? ?LUNGS: no acc muscle use,  Mod barrel  contour chest wall with bilateral  Distant bs s audible wheeze and  without cough on insp or exp maneuvers and

## 2021-06-05 NOTE — Assessment & Plan Note (Signed)
As of 04/03/2020  =  3.5 lpm hs and prn daytime ?-  04/26/2020   Walked 4lpm   approx   200 ft  @ slow pace  stopped due to sob with lowest sats 93%    ?-  05/12/2020   Walked RA  approx   450 ft  @ slow pace  stopped due to  Sob at 150 with sats down to 80% corrected to 95% on 2lpm   ?- 12/15/2020 sats ok RA at rest > referred for POC  ?- 12/15/2020   Walked on 3.5 cont  x  1  lap(s) =  approx 167f @ slow pace, stopped due to back pain/sob with lowest 02 sats 98%   ?- 04/24/2021   Walked on 3.5 lpm cont  x  one  lap(s) =  approx 150  ft  @ slow/ unsteady pace, stopped due to sob with sats consistently above 90%   ? ?Again advised: ?Make sure you check your oxygen saturation  AT  your highest level of activity (not after you stop)   to be sure it stays over 90% and adjust  02 flow upward to maintain this level if needed but remember to turn it back to previous settings when you stop (to conserve your supply).  ? ?    ?  ? ?Each maintenance medication was reviewed in detail including emphasizing most importantly the difference between maintenance and prns and under what circumstances the prns are to be triggered using an action plan format where appropriate. ? ?Total time for H and P, chart review, counseling, reviewing hfa/neb/02 device(s) and generating customized AVS unique to this office visit / same day charting  > 30 min ?     ? ? ?

## 2021-06-14 ENCOUNTER — Ambulatory Visit (HOSPITAL_COMMUNITY)
Admission: RE | Admit: 2021-06-14 | Discharge: 2021-06-14 | Disposition: A | Discharge status: Home / Self Care | Payer: Medicare HMO | Source: Ambulatory Visit | Attending: Internal Medicine | Admitting: Internal Medicine

## 2021-06-14 DIAGNOSIS — J449 Chronic obstructive pulmonary disease, unspecified: Secondary | ICD-10-CM | POA: Diagnosis not present

## 2021-06-14 DIAGNOSIS — R6889 Other general symptoms and signs: Secondary | ICD-10-CM | POA: Diagnosis not present

## 2021-06-14 DIAGNOSIS — R0602 Shortness of breath: Secondary | ICD-10-CM | POA: Diagnosis not present

## 2021-06-14 DIAGNOSIS — R059 Cough, unspecified: Secondary | ICD-10-CM | POA: Diagnosis not present

## 2021-06-20 DIAGNOSIS — E7800 Pure hypercholesterolemia, unspecified: Secondary | ICD-10-CM | POA: Diagnosis not present

## 2021-06-20 DIAGNOSIS — E039 Hypothyroidism, unspecified: Secondary | ICD-10-CM | POA: Diagnosis not present

## 2021-06-20 DIAGNOSIS — I10 Essential (primary) hypertension: Secondary | ICD-10-CM | POA: Diagnosis not present

## 2021-06-20 DIAGNOSIS — G8929 Other chronic pain: Secondary | ICD-10-CM | POA: Diagnosis not present

## 2021-06-20 DIAGNOSIS — K219 Gastro-esophageal reflux disease without esophagitis: Secondary | ICD-10-CM | POA: Diagnosis not present

## 2021-06-20 DIAGNOSIS — J449 Chronic obstructive pulmonary disease, unspecified: Secondary | ICD-10-CM | POA: Diagnosis not present

## 2021-06-20 DIAGNOSIS — I25810 Atherosclerosis of coronary artery bypass graft(s) without angina pectoris: Secondary | ICD-10-CM | POA: Diagnosis not present

## 2021-06-20 DIAGNOSIS — F331 Major depressive disorder, recurrent, moderate: Secondary | ICD-10-CM | POA: Diagnosis not present

## 2021-06-26 ENCOUNTER — Other Ambulatory Visit: Payer: Self-pay | Admitting: Cardiology

## 2021-06-26 ENCOUNTER — Other Ambulatory Visit: Payer: Self-pay

## 2021-06-26 ENCOUNTER — Telehealth: Payer: Self-pay | Admitting: Internal Medicine

## 2021-06-26 NOTE — Telephone Encounter (Signed)
Black spots on fee do not sound like a prednisone problem though the swelling may be  Rec see PCP or go to [facility] for eval this week

## 2021-06-26 NOTE — Patient Outreach (Signed)
[facility]) Care Management  06/26/2021  [name] 1950/05/14 [id number]   Case Closure   Multiple attempts to establish contact with patient without success.    Plan: RN CM will close case.   [name], RN,BSN,CCM [place] Management Telephonic Care Management Coordinator Direct Phone: [phone] Toll Free: [phone] Fax: [phone]

## 2021-06-26 NOTE — Telephone Encounter (Signed)
Left ankle and foot swelling and he states that right ankle and foot are starting to swell. He states it is hard to put weight on feet without them hurting and he has "black spots" that look like they could be bruises on his left foot.  He wants to know if the feet and ankle swelling could be caused by prednisone. He is taking 10 mg daily.

## 2021-06-26 NOTE — Telephone Encounter (Signed)
Called and spoke with patient. He verbalized understanding and will contact his PCP for further recommendations. He is aware to call us back if he is not able to get in contact with his PCP.   Nothing further needed at time of call.

## 2021-06-27 ENCOUNTER — Other Ambulatory Visit: Payer: Self-pay

## 2021-06-27 ENCOUNTER — Encounter (HOSPITAL_COMMUNITY): Payer: Self-pay | Admitting: Emergency Medicine

## 2021-06-27 ENCOUNTER — Emergency Department (HOSPITAL_COMMUNITY): Payer: Medicare HMO

## 2021-06-27 ENCOUNTER — Emergency Department (HOSPITAL_COMMUNITY)
Admission: EM | Admit: 2021-06-27 | Discharge: 2021-06-27 | Disposition: A | Discharge status: Other Acute Inpatient Hospital | Payer: Medicare HMO | Attending: Emergency Medicine | Admitting: Emergency Medicine

## 2021-06-27 DIAGNOSIS — J439 Emphysema, unspecified: Secondary | ICD-10-CM | POA: Diagnosis not present

## 2021-06-27 DIAGNOSIS — M7989 Other specified soft tissue disorders: Secondary | ICD-10-CM | POA: Diagnosis not present

## 2021-06-27 DIAGNOSIS — I723 Aneurysm of iliac artery: Secondary | ICD-10-CM | POA: Diagnosis not present

## 2021-06-27 DIAGNOSIS — R202 Paresthesia of skin: Secondary | ICD-10-CM | POA: Insufficient documentation

## 2021-06-27 DIAGNOSIS — Z66 Do not resuscitate: Secondary | ICD-10-CM | POA: Diagnosis not present

## 2021-06-27 DIAGNOSIS — Z7982 Long term (current) use of aspirin: Secondary | ICD-10-CM | POA: Diagnosis not present

## 2021-06-27 DIAGNOSIS — I7143 Infrarenal abdominal aortic aneurysm, without rupture: Secondary | ICD-10-CM | POA: Insufficient documentation

## 2021-06-27 DIAGNOSIS — R0600 Dyspnea, unspecified: Secondary | ICD-10-CM | POA: Diagnosis not present

## 2021-06-27 DIAGNOSIS — Z951 Presence of aortocoronary bypass graft: Secondary | ICD-10-CM | POA: Diagnosis not present

## 2021-06-27 DIAGNOSIS — J9622 Acute and chronic respiratory failure with hypercapnia: Secondary | ICD-10-CM | POA: Diagnosis not present

## 2021-06-27 DIAGNOSIS — J449 Chronic obstructive pulmonary disease, unspecified: Secondary | ICD-10-CM | POA: Diagnosis not present

## 2021-06-27 DIAGNOSIS — I714 Abdominal aortic aneurysm, without rupture, unspecified: Secondary | ICD-10-CM | POA: Diagnosis not present

## 2021-06-27 DIAGNOSIS — Z9989 Dependence on other enabling machines and devices: Secondary | ICD-10-CM | POA: Diagnosis not present

## 2021-06-27 DIAGNOSIS — I25810 Atherosclerosis of coronary artery bypass graft(s) without angina pectoris: Secondary | ICD-10-CM | POA: Diagnosis not present

## 2021-06-27 DIAGNOSIS — R0602 Shortness of breath: Secondary | ICD-10-CM | POA: Diagnosis not present

## 2021-06-27 DIAGNOSIS — Z87891 Personal history of nicotine dependence: Secondary | ICD-10-CM | POA: Diagnosis not present

## 2021-06-27 DIAGNOSIS — R079 Chest pain, unspecified: Secondary | ICD-10-CM | POA: Diagnosis not present

## 2021-06-27 DIAGNOSIS — M79672 Pain in left foot: Secondary | ICD-10-CM | POA: Insufficient documentation

## 2021-06-27 DIAGNOSIS — I161 Hypertensive emergency: Secondary | ICD-10-CM | POA: Diagnosis not present

## 2021-06-27 DIAGNOSIS — K5730 Diverticulosis of large intestine without perforation or abscess without bleeding: Secondary | ICD-10-CM | POA: Diagnosis not present

## 2021-06-27 DIAGNOSIS — R1032 Left lower quadrant pain: Secondary | ICD-10-CM | POA: Diagnosis present

## 2021-06-27 DIAGNOSIS — I71019 Dissection of thoracic aorta, unspecified: Secondary | ICD-10-CM | POA: Diagnosis not present

## 2021-06-27 DIAGNOSIS — Z515 Encounter for palliative care: Secondary | ICD-10-CM | POA: Diagnosis not present

## 2021-06-27 DIAGNOSIS — R531 Weakness: Secondary | ICD-10-CM | POA: Diagnosis not present

## 2021-06-27 DIAGNOSIS — I2510 Atherosclerotic heart disease of native coronary artery without angina pectoris: Secondary | ICD-10-CM | POA: Diagnosis not present

## 2021-06-27 DIAGNOSIS — R0989 Other specified symptoms and signs involving the circulatory and respiratory systems: Secondary | ICD-10-CM | POA: Diagnosis not present

## 2021-06-27 DIAGNOSIS — I10 Essential (primary) hypertension: Secondary | ICD-10-CM | POA: Diagnosis not present

## 2021-06-27 DIAGNOSIS — I7103 Dissection of thoracoabdominal aorta: Secondary | ICD-10-CM | POA: Diagnosis not present

## 2021-06-27 DIAGNOSIS — I716 Thoracoabdominal aortic aneurysm, without rupture, unspecified: Secondary | ICD-10-CM | POA: Diagnosis not present

## 2021-06-27 DIAGNOSIS — M6250 Muscle wasting and atrophy, not elsewhere classified, unspecified site: Secondary | ICD-10-CM | POA: Diagnosis not present

## 2021-06-27 DIAGNOSIS — J9621 Acute and chronic respiratory failure with hypoxia: Secondary | ICD-10-CM | POA: Diagnosis not present

## 2021-06-27 LAB — CBC
HCT: 40 % (ref 39.0–52.0)
Hemoglobin: 13.2 g/dL (ref 13.0–17.0)
MCH: 32.8 pg (ref 26.0–34.0)
MCHC: 33 g/dL (ref 30.0–36.0)
MCV: 99.5 fL (ref 80.0–100.0)
Platelets: 141 10*3/uL — ABNORMAL LOW (ref 150–400)
RBC: 4.02 MIL/uL — ABNORMAL LOW (ref 4.22–5.81)
RDW: 12.7 % (ref 11.5–15.5)
WBC: 14.2 10*3/uL — ABNORMAL HIGH (ref 4.0–10.5)
nRBC: 0 % (ref 0.0–0.2)

## 2021-06-27 LAB — COMPREHENSIVE METABOLIC PANEL
ALT: 13 U/L (ref 0–44)
AST: 14 U/L — ABNORMAL LOW (ref 15–41)
Albumin: 3.7 g/dL (ref 3.5–5.0)
Alkaline Phosphatase: 53 U/L (ref 38–126)
Anion gap: 7 (ref 5–15)
BUN: 14 mg/dL (ref 8–23)
CO2: 36 mmol/L — ABNORMAL HIGH (ref 22–32)
Calcium: 8.7 mg/dL — ABNORMAL LOW (ref 8.9–10.3)
Chloride: 94 mmol/L — ABNORMAL LOW (ref 98–111)
Creatinine, Ser: 0.82 mg/dL (ref 0.61–1.24)
GFR, Estimated: >60 mL/min (ref >60)
Glucose, Bld: 127 mg/dL — ABNORMAL HIGH (ref 70–99)
Potassium: 3.2 mmol/L — ABNORMAL LOW (ref 3.5–5.1)
Sodium: 137 mmol/L (ref 135–145)
Total Bilirubin: 0.7 mg/dL (ref 0.3–1.2)
Total Protein: 6.7 g/dL (ref 6.5–8.1)

## 2021-06-27 LAB — LIPASE, BLOOD: Lipase: 21 U/L (ref 11–51)

## 2021-06-27 LAB — TROPONIN I (HIGH SENSITIVITY): Troponin I (High Sensitivity): 23 ng/L — ABNORMAL HIGH (ref <18)

## 2021-06-27 MED ORDER — IOHEXOL 350 MG/ML SOLN
100.0000 mL | Freq: Once | INTRAVENOUS | Status: AC | PRN
  Administered 2021-06-27: 80 mL via INTRAVENOUS

## 2021-06-27 MED ORDER — HYDROMORPHONE HCL 1 MG/ML IJ SOLN: 1.0000 mg | Freq: Once | INTRAMUSCULAR | Status: DC

## 2021-06-27 MED ORDER — HYDROMORPHONE HCL 1 MG/ML IJ SOLN
1.0000 mg | Freq: Once | INTRAMUSCULAR | Status: AC
  Administered 2021-06-27: 1 mg via INTRAVENOUS
  Filled 2021-06-27: qty 1

## 2021-06-27 MED ORDER — HYDROMORPHONE HCL 1 MG/ML IJ SOLN
0.5000 mg | Freq: Once | INTRAMUSCULAR | Status: AC
  Administered 2021-06-27: 0.5 mg via INTRAVENOUS
  Filled 2021-06-27: qty 1

## 2021-06-27 MED ORDER — CLEVIDIPINE BUTYRATE 0.5 MG/ML IV EMUL
0.0000 mg/h | INTRAVENOUS | Status: DC
  Administered 2021-06-27: 11 mg/h via INTRAVENOUS
  Administered 2021-06-27: 1 mg/h via INTRAVENOUS
  Filled 2021-06-27 (×3): qty 50

## 2021-06-27 MED ORDER — HYDROMORPHONE HCL 1 MG/ML IJ SOLN: 1.0000 mg | INTRAMUSCULAR | Status: DC | PRN

## 2021-06-27 MED ORDER — ESMOLOL HCL-SODIUM CHLORIDE 2000 MG/100ML IV SOLN
25.0000 ug/kg/min | INTRAVENOUS | Status: DC
  Administered 2021-06-27: 25 ug/kg/min via INTRAVENOUS
  Filled 2021-06-27 (×2): qty 100

## 2021-06-27 NOTE — ED Notes (Signed)
Carelink leaving with patient at this time.  

## 2021-06-27 NOTE — ED Notes (Signed)
Pt received to [facility] from [facility] via [facility].  Vascular surgery Mds to bedside.  Pt A&Ox4, NAD noted.  Pt c/o abdominal pain 7/10.

## 2021-06-27 NOTE — ED Notes (Signed)
Patient transported to CT 

## 2021-06-27 NOTE — ED Notes (Signed)
Vascular surgery at bedside.

## 2021-06-27 NOTE — ED Triage Notes (Signed)
Pt has c/o of left sided abdominal pain that radiates to back, with some nausea, history of chronic O2 use for COPD, chronic back pain, stable aortic dissection.

## 2021-06-27 NOTE — ED Notes (Addendum)
[facility] at bedside. Patient is alert and oriented, interactive in conversation, skin warm and dry at this time. Patient requested to speak to wife. Patient spoke to wife and had conversation.

## 2021-06-27 NOTE — Consult Note (Signed)
[facility]  Patient name: [name] MRN: [id number] DOB: 1950/01/27 Sex: male   REQUESTING PROVIDER:    ER   REASON FOR CONSULT:    AAA  HISTORY OF PRESENT ILLNESS:   [name] is a 71 y.o. male, who presented to [facility] emergency department earlier today with abdominal pain.  This was sudden onset.  He denies any chest pain or back pain.  The patient has been seen previously by Dr. Donnetta Hutching with a chronic type B aortic dissection and abdominal aortic aneurysm neither of which required repair.  His dissection has been present since just after his CABG in 2009.  Patient is a chronic smoker and has COPD.  He continues on Plavix for his coronary artery disease, status post multiple interventions most recently in 2020.  He takes a statin for hypercholesterolemia.  He is medically managed for hypertension  PAST MEDICAL HISTORY    Past Medical History:  Diagnosis Date   Anemia    Anxiety    Aortic dissection (HCC)    TYPE 3   CAD (coronary artery disease)    multiple, most recently Jan-March 2017   Cardiac tamponade    Chronic bronchitis (HCC)    Chronic fatigue    Chronic pain    Constipation    COPD (chronic obstructive pulmonary disease) (HCC)    Depression    Dysphagia, oropharyngeal phase    ETOH abuse    GERD (gastroesophageal reflux disease)    Hiatal hernia    HTN (hypertension)    Hypercholesterolemia    Metabolic encephalopathy    Opioid abuse ([place])    Peptic ulcer disease 08/2010   EGD    PNA (pneumonia)    Schatzki's ring      FAMILY HISTORY   Family History  Problem Relation Age of Onset   Heart disease Father    Emphysema Paternal Grandmother        never smoked, worked in a Equities trader   Emphysema Paternal Grandfather        never smoked, worked in a Naval architect cancer Paternal Grandfather    Asthma Sister     SOCIAL HISTORY:   Social History   Socioeconomic  History   Marital status: Married    Spouse name: Not on file   Number of children: Not on file   Years of education: Not on file   Highest education level: Not on file  Occupational History   Occupation: Dealer  Tobacco Use   Smoking status: Former    Packs/day: 1.00    Years: 20.00    Pack years: 20.00    Types: Cigarettes    Quit date: 12/22/2019    Years since quitting: 1.5   Smokeless tobacco: Former    Quit date: 06/05/2015  Vaping Use   Vaping Use: Never used  Substance and Sexual Activity   Alcohol use: No    Alcohol/week: 0.0 standard drinks    Comment: denies   Drug use: No   Sexual activity: Not on file  Other Topics Concern   Not on file  Social History Narrative          Social Determinants of Health   Financial Resource Strain: Not on file  Food Insecurity: Not on file  Transportation Needs: Not on file  Physical Activity: Not on file  Stress: Not on file  Social Connections: Not on file  Intimate Partner Violence: Not on file  ALLERGIES:    Allergies  Allergen Reactions   Nsaids Shortness Of Breath and Itching   Bee Venom Swelling    Neck swelling, passes out   Codeine Other (See Comments)    Causes GI Upset   Other     Other reaction(s): lightheaded, flushed   Ranexa [Ranolazine]     Other reaction(s): lightheaded, flushed    CURRENT MEDICATIONS:    Current Facility-Administered Medications  Medication Dose Route Frequency Provider Last Rate Last Admin   clevidipine (CLEVIPREX) infusion 0.5 mg/mL  0-21 mg/hr Intravenous Continuous Sherwood Gambler, MD 20 mL/hr at 06/27/21 1603 10 mg/hr at 06/27/21 1603   esmolol (BREVIBLOC) 2000 mg / 100 mL (20 mg/mL) infusion  25-300 mcg/kg/min Intravenous Continuous Sherwood Gambler, MD 20.4 mL/hr at 06/27/21 1352 100 mcg/kg/min at 06/27/21 1352   HYDROmorphone (DILAUDID) injection 1 mg  1 mg Intravenous Q2H PRN Godfrey Pick, MD       Current Outpatient Medications  Medication Sig Dispense Refill    albuterol (PROVENTIL) (2.5 MG/3ML) 0.083% nebulizer solution Take 3 mLs (2.5 mg total) by nebulization every 6 (six) hours as needed for wheezing or shortness of breath.     albuterol (VENTOLIN HFA) 108 (90 Base) MCG/ACT inhaler INHALE TWO puffs BY MOUTH EVERY 4 HOURS AS NEEDED (IF can't catch breath) 18 g 5   ALPRAZolam (XANAX) 0.5 MG tablet Take 0.5 mg by mouth 2 (two) times daily as needed for anxiety.      aspirin EC 81 MG tablet Take 1 tablet (81 mg total) by mouth daily. 30 tablet 12   atorvastatin (LIPITOR) 40 MG tablet Take 1 tablet (40 mg total) by mouth daily. 90 tablet 3   benzonatate (TESSALON) 200 MG capsule Take 1 capsule (200 mg total) by mouth 3 (three) times daily as needed for cough. 30 capsule 1   Budeson-Glycopyrrol-Formoterol (BREZTRI AEROSPHERE) 160-9-4.8 MCG/ACT AERO Inhale 2 puffs into the lungs 2 (two) times daily. 5.9 g 0   Budeson-Glycopyrrol-Formoterol (BREZTRI AEROSPHERE) 160-9-4.8 MCG/ACT AERO Inhale 2 puffs into the lungs in the morning and at bedtime. 10.7 g 0   busPIRone (BUSPAR) 7.5 MG tablet Take 1 tablet (7.5 mg total) by mouth 3 (three) times daily. 45 tablet 0   clopidogrel (PLAVIX) 75 MG tablet TAKE ONE TABLET BY MOUTH ONCE DAILY 15 tablet 0   escitalopram (LEXAPRO) 5 MG tablet Take 5 mg by mouth daily.     feeding supplement, ENSURE ENLIVE, (ENSURE ENLIVE) LIQD Take 237 mLs by mouth 2 (two) times daily between meals.     gabapentin (NEURONTIN) 300 MG capsule Take 300 mg by mouth 3 (three) times daily.     levothyroxine (SYNTHROID, LEVOTHROID) 50 MCG tablet Take 50 mcg by mouth daily.     metoprolol tartrate (LOPRESSOR) 25 MG tablet Take 1 tablet (25 mg total) by mouth 3 (three) times daily. 270 tablet 3   nitroGLYCERIN (NITROSTAT) 0.4 MG SL tablet Place 1 tablet (0.4 mg total) under the tongue every 5 (five) minutes as needed for chest pain. 25 tablet 3   OXYGEN Inhale 3.5 L into the lungs daily as needed (for shortness of breath). 3-3.5lpm 24/7      pantoprazole (PROTONIX) 40 MG tablet Take 1 tablet (40 mg total) by mouth at bedtime. 30 tablet 0   predniSONE (DELTASONE) 10 MG tablet Take 2 daily until better then 1 daily 100 tablet 2   traMADol (ULTRAM) 50 MG tablet Take 2 tablets (100 mg total) by mouth every 8 (eight) hours as  needed for severe pain. 20 tablet 0   traZODone (DESYREL) 100 MG tablet Take 1 tablet (100 mg total) by mouth at bedtime as needed for sleep. 30 tablet 0    REVIEW OF SYSTEMS:   '[X]'$  denotes positive finding, '[ ]'$  denotes negative finding Cardiac  Comments:  Chest pain or chest pressure:    Shortness of breath upon exertion:    Short of breath when lying flat:    Irregular heart rhythm:        Vascular    Pain in calf, thigh, or hip brought on by ambulation:    Pain in feet at night that wakes you up from your sleep:     Blood clot in your veins:    Leg swelling:         Pulmonary    Oxygen at home:    Productive cough:     Wheezing:         Neurologic    Sudden weakness in arms or legs:     Sudden numbness in arms or legs:     Sudden onset of difficulty speaking or slurred speech:    Temporary loss of vision in one eye:     Problems with dizziness:         Gastrointestinal    Blood in stool:      Vomited blood:         Genitourinary    Burning when urinating:     Blood in urine:        Psychiatric    Major depression:         Hematologic    Bleeding problems:    Problems with blood clotting too easily:        Skin    Rashes or ulcers:        Constitutional    Fever or chills:     PHYSICAL EXAM:   Vitals:   06/27/21 1600 06/27/21 1602 06/27/21 1605 06/27/21 1606  BP: (!) 81/58 (!) 81/59 (!) 79/57 113/65  Pulse: 71 66 72 66  Resp: (!) '22 20 20 17  '$ Temp:      TempSrc:      SpO2: 98% 98% 99% 97%  Weight:      Height:        GENERAL: The patient is a well-nourished male, in no acute distress. The vital signs are documented above. CARDIAC: There is a regular rate and rhythm.   VASCULAR: Nonpalpable pedal pulses.  Palpable bilateral radial pulses PULMONARY: Nonlabored respirations ABDOMEN: Soft with tenderness to mild palpation in the lower abdomen  MUSCULOSKELETAL: There are no major deformities or cyanosis. NEUROLOGIC: No focal weakness or paresthesias are detected. SKIN: There are no ulcers or rashes noted. PSYCHIATRIC: The patient has a normal affect.  STUDIES:   I have reviewed his CT scan with the following findings: 1. Enlarging 5.6 cm abdominal aortic aneurysm (previously 4.8 on 04/04/2020) with new anterior soft tissue attenuation material suggesting imminent or contained rupture. Recommend urgent vascular surgical consultation. Critical Value/emergent results were called by telephone at the time of interpretation on 06/27/2021 at 1:12 pm to provider Sherwood Gambler , who verbally acknowledged these results.   2. Chronic partially thrombosed type B thoracic aortic dissection with stable 4.8 cm dilatation of proximal descending segment. 3. 1.9 cm left and 1.3 cm right common iliac artery aneurysms. 4. Coronary and Aortic Atherosclerosis (ICD10-170.0). 5.  Emphysema (ICD10-J43.9). 6. Descending and sigmoid diverticulosis. ASSESSMENT and PLAN   Symptomatic abdominal aortic  aneurysm: Maximum aortic diameter is 5.6 cm.  The patient is very tender particularly in the lower abdomen.  He also has a chronic type B dissection which has been present for over 10 years.  The false lumen is thrombosed in the chest.  However, there is ectasia of the juxtarenal aorta.  I offered him the possibility of a open repair however I am concerned that with his underlying pulmonary disease, his continued use of Plavix, and the dissection extending into the visceral aorta, that he would have a high risk for major complication with open surgery.  I spoke with Dr. Sammuel Hines at [facility] who has agreed to accept the patient in transfer for consideration of endovascular repair.  I spoke with  the patient and he is agreeable to this.  Until he is transferred, I would continue with permissive hypotension using Cleviprex to keep his systolic blood pressure less than 110.  Continue with IV hydration.   Leia Alf, MD, FACS [facility] [phone] Pager [phone]

## 2021-06-27 NOTE — ED Provider Notes (Signed)
[facility] EMERGENCY DEPARTMENT Provider Note   CSN: [id number] Arrival date & time: 06/27/21  1050     History  Chief Complaint  Patient presents with   Back Pain   Abdominal Pain    [name] is a 71 y.o. male.  HPI 71 year old male presents with acute left lower abdominal pain.  Started this morning.  Wraps around to his left back as well as across his abdomen to the right side.  Was sudden onset and is severe.  He is also been dealing with left foot pain and a little bit of toe numbness with swelling for about a week.  No chest pain today but he has chronic dyspnea and it seems worse with the pain.  No fevers noted.  Home Medications Prior to Admission medications   Medication Sig Start Date End Date Taking? Authorizing Provider  albuterol (PROVENTIL) (2.5 MG/3ML) 0.083% nebulizer solution Take 3 mLs (2.5 mg total) by nebulization every 6 (six) hours as needed for wheezing or shortness of breath. 01/04/18   Barton Dubois, MD  albuterol (VENTOLIN HFA) 108 (90 Base) MCG/ACT inhaler INHALE TWO puffs BY MOUTH EVERY 4 HOURS AS NEEDED (IF can't catch breath) 04/20/21   Tanda Rockers, MD  ALPRAZolam Duanne Moron) 0.5 MG tablet Take 0.5 mg by mouth 2 (two) times daily as needed for anxiety.  04/09/19   [provider]  aspirin EC 81 MG tablet Take 1 tablet (81 mg total) by mouth daily. 09/21/18   Jettie Booze, MD  atorvastatin (LIPITOR) 40 MG tablet Take 1 tablet (40 mg total) by mouth daily. 09/15/18   Imogene Burn, PA-C  benzonatate (TESSALON) 200 MG capsule Take 1 capsule (200 mg total) by mouth 3 (three) times daily as needed for cough. 06/05/21   Tanda Rockers, MD  Budeson-Glycopyrrol-Formoterol (BREZTRI AEROSPHERE) 160-9-4.8 MCG/ACT AERO Inhale 2 puffs into the lungs 2 (two) times daily. 04/03/20   Tanda Rockers, MD  Budeson-Glycopyrrol-Formoterol (BREZTRI AEROSPHERE) 160-9-4.8 MCG/ACT AERO Inhale 2 puffs into the lungs in the morning and at bedtime. 06/05/21   Tanda Rockers, MD  busPIRone (BUSPAR) 7.5 MG tablet Take 1 tablet (7.5 mg total) by mouth 3 (three) times daily. 01/04/18   Barton Dubois, MD  clopidogrel (PLAVIX) 75 MG tablet TAKE ONE TABLET BY MOUTH ONCE DAILY 06/26/21   Jerline Pain, MD  escitalopram (LEXAPRO) 5 MG tablet Take 5 mg by mouth daily. 04/09/19   [provider]  feeding supplement, ENSURE ENLIVE, (ENSURE ENLIVE) LIQD Take 237 mLs by mouth 2 (two) times daily between meals. 01/04/18   Barton Dubois, MD  gabapentin (NEURONTIN) 300 MG capsule Take 300 mg by mouth 3 (three) times daily. 12/20/17   [provider]  levothyroxine (SYNTHROID, LEVOTHROID) 50 MCG tablet Take 50 mcg by mouth daily. 03/18/17   [provider]  metoprolol tartrate (LOPRESSOR) 25 MG tablet Take 1 tablet (25 mg total) by mouth 3 (three) times daily. 05/05/18   Isaiah Serge, NP  nitroGLYCERIN (NITROSTAT) 0.4 MG SL tablet Place 1 tablet (0.4 mg total) under the tongue every 5 (five) minutes as needed for chest pain. 09/15/18   Imogene Burn, PA-C  OXYGEN Inhale 3.5 L into the lungs daily as needed (for shortness of breath). 3-3.5lpm 24/7    [provider]  pantoprazole (PROTONIX) 40 MG tablet Take 1 tablet (40 mg total) by mouth at bedtime. 04/10/15   [name], MD  predniSONE (DELTASONE) 10 MG tablet Take  2 daily until better then 1 daily 06/05/21   Tanda Rockers, MD  traMADol (ULTRAM) 50 MG tablet Take 2 tablets (100 mg total) by mouth every 8 (eight) hours as needed for severe pain. 11/05/18   Barton Dubois, MD  traZODone (DESYREL) 100 MG tablet Take 1 tablet (100 mg total) by mouth at bedtime as needed for sleep. 01/04/18   Barton Dubois, MD      Allergies    Nsaids, Bee venom, Codeine, Other, and Ranexa [ranolazine]    Review of Systems   Review of Systems  Respiratory:  Positive for shortness of breath.   Cardiovascular:  Positive for leg swelling. Negative for chest pain.  Gastrointestinal:  Positive for  abdominal pain.  Musculoskeletal:  Positive for back pain.   Physical Exam Updated Vital Signs BP 109/73   Pulse 77   Temp 97.8 F (36.6 C) (Oral)   Resp 19   Ht '5\' 10"'$  (1.778 m)   Wt 68 kg   SpO2 96%   BMI 21.52 kg/m  Physical Exam Vitals and nursing note reviewed.  Constitutional:      Appearance: He is well-developed. He is ill-appearing.  HENT:     Head: Normocephalic and atraumatic.  Cardiovascular:     Rate and Rhythm: Normal rate and regular rhythm.     Pulses:          Dorsalis pedis pulses are 2+ on the right side and 2+ on the left side.     Heart sounds: Normal heart sounds.  Pulmonary:     Effort: Pulmonary effort is normal.     Breath sounds: Normal breath sounds.  Abdominal:     Palpations: Abdomen is soft.     Tenderness: There is abdominal tenderness in the right lower quadrant and left lower quadrant.  Musculoskeletal:     Thoracic back: No tenderness.     Lumbar back: No tenderness.     Comments: Left foot has swelling along with the left ankle.  Grossly normal sensation.  Intact DP pulse.  Skin:    General: Skin is warm and dry.  Neurological:     Mental Status: He is alert.    ED Results / Procedures / Treatments   Labs (all labs ordered are listed, but only abnormal results are displayed) Labs Reviewed  COMPREHENSIVE METABOLIC PANEL - Abnormal; Notable for the following components:      Result Value   Potassium 3.2 (*)    Chloride 94 (*)    CO2 36 (*)    Glucose, Bld 127 (*)    Calcium 8.7 (*)    AST 14 (*)    All other components within normal limits  CBC - Abnormal; Notable for the following components:   WBC 14.2 (*)    RBC 4.02 (*)    Platelets 141 (*)    All other components within normal limits  TROPONIN I (HIGH SENSITIVITY) - Abnormal; Notable for the following components:   Troponin I (High Sensitivity) 23 (*)    All other components within normal limits  LIPASE, BLOOD  URINALYSIS, ROUTINE W REFLEX MICROSCOPIC  TROPONIN I  (HIGH SENSITIVITY)    EKG EKG Interpretation  Date/Time:  Wednesday June 27 2021 11:37:58 EDT Ventricular Rate:  84 PR Interval:  185 QRS Duration: 92 QT Interval:  408 QTC Calculation: 483 R Axis:   82 Text Interpretation: Sinus rhythm Consider left atrial enlargement Borderline right axis deviation Borderline repol abnrm, inferolateral leads Borderline prolonged QT interval similar to  Oct 2020 Confirmed by Sherwood Gambler [phone]) on 06/27/2021 12:48:14 PM  Radiology DG Chest Portable 1 View  Result Date: 06/27/2021 CLINICAL DATA:  Shortness of breath. Hypertension. Left-sided pain radiating to the back. EXAM: PORTABLE CHEST 1 VIEW COMPARISON:  06/14/2021 FINDINGS: Previous CABG. Chronic aortic atherosclerosis, ectasia and tortuosity. Resolution of patchy density previously seen in the left mid to lower lung. Mild bilateral pulmonary scarring as seen previously. No evidence of active infiltrate, collapse, edema or effusion. IMPRESSION: No active disease. Previous CABG. Aortic atherosclerosis. Chronic pulmonary scarring. Electronically Signed   By: Nelson Chimes M.D.   On: 06/27/2021 12:22   CT Angio Chest/Abd/Pel for Dissection W and/or Wo Contrast  Result Date: 06/27/2021 CLINICAL DATA:  Left-sided abdominal pain radiating to back, COPD, history of aortic dissection EXAM: CT ANGIOGRAPHY CHEST, ABDOMEN AND PELVIS TECHNIQUE: Non-contrast CT of the chest was initially obtained. Multidetector CT imaging through the chest, abdomen and pelvis was performed using the standard protocol during bolus administration of intravenous contrast. Multiplanar reconstructed images and MIPs were obtained and reviewed to evaluate the vascular anatomy. RADIATION DOSE REDUCTION: This exam was performed according to the departmental dose-optimization program which includes automated exposure control, adjustment of the mA and/or kV according to patient size and/or use of iterative reconstruction technique. CONTRAST:  2m  OMNIPAQUE IOHEXOL 350 MG/ML SOLN COMPARISON:  04/04/2020 FINDINGS: CTA CHEST FINDINGS Cardiovascular: Heart size normal. No pericardial effusion. The RV is nondilated. Incomplete opacification of the pulmonary arterial tree; the exam was not optimized for detection of pulmonary emboli. Extensive coronary calcifications, post CABG. Good contrast opacification of the thoracic aorta. Classic 3-vessel brachiocephalic arterial origin anatomy with mild stenosis at the origin of the left common carotid artery. Presumed tube graft repair of the ascending aorta. Chronic dissection flap from just beyond the left subclavian artery origin extending through the length of the descending segment. False lumen is partially thrombosed with persistent regions of patency in the proximal descending segment and descending thoracic segment. Proximal descending segment 4.8 cm diameter, stable; 4.2 cm at the level the coronary sinus (stable by my measurement); 4.4 cm above the diaphragm (previously 4.2). Mediastinum/Nodes: No mediastinal hematoma, mass or adenopathy. Lungs/Pleura: No pleural effusion. Pulmonary emphysema. No new nodule or airspace disease. Musculoskeletal: Mild spondylitic changes in the lower cervical and thoracic spine. Review of the MIP images confirms the above findings. CTA ABDOMEN AND PELVIS FINDINGS VASCULAR Aorta: The dissection flap extends at least as far as the left renal artery, extending into the vessel and resulting mild ostial stenosis as before. Infrarenal abdominal aortic aneurysm at least 5.6 cm maximum AP diameter (previously 4.8). There is a new retroperitoneal mantle of soft tissue attenuation anterior to the aneurysm which is poorly marginated, measuring 7-9 mm in thickness; cannot exclude imminent or contained rupture. There is moderate eccentric nonocclusive mural thrombus in the aneurysmal segment. Aorta returns to normal caliber distally. Celiac: Partially calcified ostial plaque resulting in short  segment stenosis of at least mild severity, patent distally. SMA: Atheromatous, patent, with classic distal branch anatomy. Renals: Dissection flap extends approximately 1.6 cm into the proximal left renal artery, false lumen thrombosed. This results in ostial narrowing of possible hemodynamic significance, patent distally. Single right renal artery patent. IMA: Origin occlusion, reconstituted distally by visceral collaterals. Inflow: Right distal common iliac artery atheromatous, distally ectatic up to 1.3 cm diameter. Fusiform left common iliac artery aneurysm 1.9 cm diameter. Bilateral external and internal iliac arteries are atheromatous, patent. Veins: No obvious venous abnormality within the  limitations of this arterial phase study. Review of the MIP images confirms the above findings. NON-VASCULAR Hepatobiliary: No focal liver abnormality is seen. No gallstones, gallbladder wall thickening, or biliary dilatation. Pancreas: Unremarkable. No pancreatic ductal dilatation or surrounding inflammatory changes. Spleen: Normal in size without focal abnormality. Adrenals/Urinary Tract: No adrenal mass. Symmetric renal enhancement without hydronephrosis. Urinary bladder physiologically distended. Stomach/Bowel: Stomach is distended by gas and fluid. The small bowel is nondilated. Normal appendix. The colon is nondilated. There are scattered distal descending and sigmoid diverticula without significant adjacent inflammatory change. Lymphatic: No abdominal or pelvic adenopathy. Reproductive: Prostate is unremarkable. Other: No ascites.  No free air. Musculoskeletal: Instrumented spinal fusion L4-S1 with advanced adjacent level degenerative disc disease L3-4. Review of the MIP images confirms the above findings. IMPRESSION: 1. Enlarging 5.6 cm abdominal aortic aneurysm (previously 4.8 on 04/04/2020) with new anterior soft tissue attenuation material suggesting imminent or contained rupture. Recommend urgent vascular  surgical consultation. Critical Value/emergent results were called by telephone at the time of interpretation on 06/27/2021 at 1:12 pm to provider Sherwood Gambler , who verbally acknowledged these results. 2. Chronic partially thrombosed type B thoracic aortic dissection with stable 4.8 cm dilatation of proximal descending segment. 3. 1.9 cm left and 1.3 cm right common iliac artery aneurysms. 4. Coronary and Aortic Atherosclerosis (ICD10-170.0). 5.  Emphysema (ICD10-J43.9). 6. Descending and sigmoid diverticulosis. Electronically Signed   By: Lucrezia Europe M.D.   On: 06/27/2021 13:17    Procedures .Critical Care Performed by: Sherwood Gambler, MD Authorized by: Sherwood Gambler, MD   Critical care provider statement:    Critical care time (minutes):  40   Critical care time was exclusive of:  Separately billable procedures and treating other patients   Critical care was necessary to treat or prevent imminent or life-threatening deterioration of the following conditions:  Circulatory failure   Critical care was time spent personally by me on the following activities:  Development of treatment plan with patient or surrogate, discussions with consultants, evaluation of patient's response to treatment, examination of patient, ordering and review of laboratory studies, ordering and review of radiographic studies, ordering and performing treatments and interventions, pulse oximetry, re-evaluation of patient's condition and review of old charts    Medications Ordered in ED Medications  esmolol (BREVIBLOC) 2000 mg / 100 mL (20 mg/mL) infusion (100 mcg/kg/min  68 kg Intravenous Rate/Dose Change 06/27/21 1352)  clevidipine (CLEVIPREX) infusion 0.5 mg/mL (8 mg/hr Intravenous Rate/Dose Change 06/27/21 1358)  HYDROmorphone (DILAUDID) injection 1 mg (1 mg Intravenous Given 06/27/21 1203)  iohexol (OMNIPAQUE) 350 MG/ML injection 100 mL (80 mLs Intravenous Contrast Given 06/27/21 1238)  HYDROmorphone (DILAUDID) injection 1 mg  (1 mg Intravenous Given 06/27/21 1324)    ED Course/ Medical Decision Making/ A&P                           Medical Decision Making Problems Addressed: Aneurysm of infrarenal abdominal aorta, unspecified whether ruptured [facility]): acute illness or injury that poses a threat to life or bodily functions Hypertensive emergency: acute illness or injury that poses a threat to life or bodily functions  Amount and/or Complexity of Data Reviewed External Data Reviewed: radiology and notes. Labs: ordered. Radiology: ordered and independent interpretation performed. ECG/medicine tests: ordered and independent interpretation performed.  Risk Prescription drug management.   Patient is found to have an enlarging infrarenal AAA.  CT images viewed/interpreted by myself and I discussed this with radiology.  He is found  to have significant enlargement since his last CT and there is concern on the CT of potentially developing rupture.  He was quite hypertensive on arrival.  I discussed this case with vascular surgery through the [facility], Dr. Trula Slade, and he advises to send to the ED.  Discussed case with EDP at [facility], Dr. Doren Custard.  BP initially came down with IV Dilaudid and pain control though has crept back up and is now near 841 systolic.  Was given more IV pain control and then started on esmolol and then Cleviprex.  His heart rate is still little higher than ideal at 77 but his blood pressure has come down to 109/73.  Otherwise, he is stable enough for transfer and has gone via [facility].  He was appearing well just prior to transfer.        Final Clinical Impression(s) / ED Diagnoses Final diagnoses:  Aneurysm of infrarenal abdominal aorta, unspecified whether ruptured [facility])  Hypertensive emergency    Rx / DC Orders ED Discharge Orders     None         Sherwood Gambler, MD 06/27/21 [phone]

## 2021-06-27 NOTE — ED Provider Notes (Signed)
Care of patient assumed in transfer from Dr. Verner Chol.  This patient had cute onset of abdominal and back pain this morning.  He was seen at [facility] emergency department where CT scan showed enlargement of known infrarenal AAA concerning for impending rupture.  The case was discussed with vascular surgeon on-call, Dr. Trula Slade who recommended ED to ED transfer.  Patient received 2 mg of Dilaudid prior to transfer.  He was started on esmolol and Cleviprex for management of hypertension. Physical Exam  BP 109/73   Pulse 77   Temp 97.8 F (36.6 C) (Oral)   Resp 19   Ht '5\' 10"'$  (1.778 m)   Wt 68 kg   SpO2 96%   BMI 21.52 kg/m   Physical Exam Vitals and nursing note reviewed.  Constitutional:      General: He is not in acute distress.    Appearance: He is well-developed and normal weight. He is not ill-appearing, toxic-appearing or diaphoretic.  HENT:     Head: Normocephalic and atraumatic.     Mouth/Throat:     Mouth: Mucous membranes are moist.     Pharynx: Oropharynx is clear.  Eyes:     Extraocular Movements: Extraocular movements intact.     Conjunctiva/sclera: Conjunctivae normal.  Cardiovascular:     Rate and Rhythm: Normal rate and regular rhythm.  Pulmonary:     Effort: Pulmonary effort is normal. No respiratory distress.  Abdominal:     Palpations: Abdomen is soft.     Tenderness: There is no abdominal tenderness.  Musculoskeletal:        General: No swelling.     Cervical back: Neck supple.  Skin:    General: Skin is warm and dry.     Coloration: Skin is not cyanotic, jaundiced or pale.  Neurological:     General: No focal deficit present.     Mental Status: He is alert and oriented to person, place, and time.     Cranial Nerves: No cranial nerve deficit.     Motor: No weakness.  Psychiatric:        Mood and Affect: Mood normal.        Behavior: Behavior normal.    Procedures  Procedures  ED Course / MDM    Medical Decision Making Amount and/or  Complexity of Data Reviewed Labs: ordered. Radiology: ordered.  Risk Prescription drug management.   Patient arrives from [facility], ED via [place].  Blood pressure control is maintained on esmolol and Cleviprex gtt.'s.  Blood pressure on arrival in the range of 110s over 70s.  Patient reports currently controlled pain but feels like it may be coming back.  Additional Dilaudid was ordered.  Vascular surgery came and evaluated the patient in the ED.  They reviewed imaging and I feel that patient needs complex endovascular repair.  Dr. Trula Slade contacted his colleague at [facility], Dr. Sammuel Hines, who accepts the patient in transfer.  SBP goal will be 100-110.  I spoke with Stanton Kidney from transfer center who states that she will call back when bed is available.  Patient to be transferred to [facility] in [place].  Care of patient was signed out to oncoming ED provider.       Godfrey Pick, MD 06/27/21 [phone]

## 2021-06-27 NOTE — ED Notes (Signed)
Patient gave verbal consent for transfer to [facility].

## 2021-06-27 NOTE — ED Notes (Signed)
Patient returned from CT

## 2021-06-28 DIAGNOSIS — Z9989 Dependence on other enabling machines and devices: Secondary | ICD-10-CM | POA: Diagnosis not present

## 2021-06-28 DIAGNOSIS — I716 Thoracoabdominal aortic aneurysm, without rupture, unspecified: Secondary | ICD-10-CM | POA: Diagnosis not present

## 2021-06-28 DIAGNOSIS — R531 Weakness: Secondary | ICD-10-CM | POA: Diagnosis not present

## 2021-06-28 DIAGNOSIS — I10 Essential (primary) hypertension: Secondary | ICD-10-CM | POA: Diagnosis not present

## 2021-06-28 DIAGNOSIS — M6250 Muscle wasting and atrophy, not elsewhere classified, unspecified site: Secondary | ICD-10-CM | POA: Diagnosis not present

## 2021-07-21 DEATH — deceased

## 2021-07-25 ENCOUNTER — Ambulatory Visit: Payer: Medicare HMO | Admitting: Internal Medicine

## 2021-07-25 NOTE — Progress Notes (Deleted)
Subjective:     Patient ID: [name], male   DOB: May 27, 1950    MRN: [id number]    Brief patient profile:  63 yowm MM/reports quit smoking 12/2019 with h/o IHD then sob both with exertion and at rest and noct x summer 2016 eval by Dr Annamaria Boots in 11/14/14 rec anoro and temporarily improved but says could not get it refilled and since then just using saba in multiple forms so referred to pulmonary clinic 04/30/2016 by Dr   Earle Gell and unable to perform spirometry    History of Present Illness  04/30/2016 1st [facility] office visit/ [name] on saba only   Chief Complaint  Patient presents with   Pulmonary Consult    Referred by Dr. Earle Gell for eval of COPD.  Pt states he was dxed with COPD approx 1 yr ago. He has been on o2 24/7 since July 2018.  He is using neb with albuterol and ventolin inhaler approx 3 x per day. He states that he feels SOB with exertion such as walking to the mailbox. He is sometimes SOB just sitting and occ wakes up with SOB.    Lives in [place]:   MB is  downhill and has to stop, uses inhaler and no problem at all walking back to house s 02  Never tried to  Use saba  before exertion/ last used one hour prior to OV  sob  can occur s warning at rest even if not assoc cough or cp rec  Plan A = Automatic = symbicort 160 Take 2 puffs first thing in am and then another 2 puffs about 12 hours later.  Work on inhaler technique:  relax and gently blow all the way out then take a nice smooth deep breath back in, triggering the inhaler at same time you start breathing in.  Hold for up to 5 seconds if you can. Blow out thru nose. Rinse and gargle with water when done Plan B = Backup Only use your albuterol (VENTOLIN)  as a rescue medication Plan C = Crisis - only use your albuterol nebulizer if you first try Plan B and it fails to help > ok to use the nebulizer up to every 4 hours but if start needing it regularly call for immediate appointment The key is to stop  smoking completely before smoking completely stops you!      04/24/2021  f/u ov/[place] office/[name] re: GOLD 4 maint on Breztri /02  Chief Complaint  Patient presents with   Follow-up    Patient is having increased shortness of breath with exertion. Wears 4 liters of oxygen all the time. Had PFT done  Dyspnea:  walking across house on 3.5 lpm not titrating as rec/ extremely sedentary  Cough: no  Sleeping: 25 degrees one flat pillow  SABA use: hfa  sev times a day / neb ?  (Very confused with prns and maint rx ) 02: 3-4 lpm really not titrating   Rec Make sure you check your oxygen saturation  AT  your highest level of activity (not after you stop)   to be sure it stays over 90%  Plan A = Automatic = Always=    Breztri Take 2 puffs first thing in am and then another 2 puffs about 12 hours later.  Work on inhaler technique: Plan B = Backup (to supplement plan A, not to replace it) Only use your albuterol inhaler as a rescue medication to be used i Plan C =  Crisis (instead of Plan B but only if Plan B stops working) - only use your albuterol nebulizer if you first try Plan B and it fails to help   Try taking protonix 40 mg Take 30-60 min before  last meal of the day  Please schedule a follow up office visit in 6 weeks, call sooner if needed with all medications /inhalers/ solutions in hand so we can verify exactly what you are taking. This includes all medications from all doctors and over the counters    06/05/2021  f/u ov/[place] office/[name] re: GOLD 4 copd  maint on breztri  Chief Complaint  Patient presents with   Follow-up    Patient feels his breathing has worsened. He states he cannot walk from his couch to his kitchen anymore without being SOB. Went from 166 to 145 since last OV.   Dyspnea:  worse than usual x one week/ better only while on prednisone  Cough: worse than usual dry day > noct , also better on prednisone Sleeping: 25 degrees hob electric  SABA use: hfa 3-4 x  daily / neb  02: 3-4 lpm  Covid status: none   Rec Make sure you check your oxygen saturation  AT  your highest level of activity (not after you stop)   to be sure it stays over 90%   Plan A = Automatic = Always=    Breztri Take 2 puffs first thing in am and then another 2 puffs about 12 hours later.  And PREDNISONE 10 mg daily   Work on inhaler technique:  Plan B = Backup (to supplement plan A, not to replace it) Only use your albuterol inhaler as a rescue medication  Plan C = Crisis (instead of Plan B but only if Plan B stops working) - only use your albuterol nebulizer if you first try Plan B and it fails to help > ok to use the nebulizer up to every 4 hours but if start needing it regularly call for immediate appointment Plan D = double the prednisone until better  For cough > tessalon 200 mg  every 6 hours as needed  For congested cough > mucinex '1200mg'$  every 12 hours as needed        07/25/2021  f/u ov/[place] office/[name] re: GOLD 4 /AAA maint on ***  No chief complaint on file.   Dyspnea:  *** Cough: *** Sleeping: *** SABA use: *** 02: *** Covid status: *** Lung cancer screening: ***   No obvious day to day or daytime variability or assoc excess/ purulent sputum or mucus plugs or hemoptysis or cp or chest tightness, subjective wheeze or overt sinus or hb symptoms.   *** without nocturnal  or early am exacerbation  of respiratory  c/o's or need for noct saba. Also denies any obvious fluctuation of symptoms with weather or environmental changes or other aggravating or alleviating factors except as outlined above   No unusual exposure hx or h/o childhood pna/ asthma or knowledge of premature birth.  Current Allergies, Complete Past Medical History, Past Surgical History, Family History, and Social History were reviewed in [employer] record.  ROS  The following are not active complaints unless bolded Hoarseness, sore throat, dysphagia, dental  problems, itching, sneezing,  nasal congestion or discharge of excess mucus or purulent secretions, ear ache,   fever, chills, sweats, unintended wt loss or wt gain, classically pleuritic or exertional cp,  orthopnea pnd or arm/hand swelling  or leg swelling, presyncope, palpitations, abdominal  pain, anorexia, nausea, vomiting, diarrhea  or change in bowel habits or change in bladder habits, change in stools or change in urine, dysuria, hematuria,  rash, arthralgias, visual complaints, headache, numbness, weakness or ataxia or problems with walking or coordination,  change in mood or  memory.        No outpatient medications have been marked as taking for the 07/25/21 encounter (Appointment) with Tanda Rockers, MD.               Objective:   Physical Exam   Wts  07/25/2021           ***  06/05/2021        145   04/24/2021          166 12/15/2020      171  05/12/2020        170 04/26/2020         173 04/03/2020        173 10/11/2016       144   04/30/16 143 lb (64.9 kg)  11/17/15 142 lb 10.2 oz (64.7 kg)  08/03/15 150 lb (68 kg)         Mod bar ***             CXR PA and Lateral:   06/05/2021 :    I personally reviewed images and agree with radiology impression as follows:    Did not go for cxr as rec    I personally reviewed images and agree with radiology impression as follows:   Chest /abd CTa 06/27/21 1. Enlarging 5.6 cm abdominal aortic aneurysm (previously 4.8 on 04/04/2020) with new anterior soft tissue attenuation material suggesting imminent or contained rupture.    2. Chronic partially thrombosed type B thoracic aortic dissection with stable 4.8 cm dilatation of proximal descending segment. 3. 1.9 cm left and 1.3 cm right common iliac artery aneurysms. 4. Coronary and Aortic Atherosclerosis (ICD10-170.0). 5.  Emphysema (ICD10-J43.9). 6. Descending and sigmoid diverticulosis.      Assessment:

## 2021-07-26 ENCOUNTER — Ambulatory Visit: Payer: Self-pay
